# Patient Record
Sex: Female | Born: 1950
Health system: Southern US, Community
[De-identification: ages and names within clinical notes are randomized; demographics above are authoritative.]

## PROBLEM LIST (undated history)

## (undated) DIAGNOSIS — K219 Gastro-esophageal reflux disease without esophagitis: Secondary | ICD-10-CM

## (undated) DIAGNOSIS — F329 Major depressive disorder, single episode, unspecified: Secondary | ICD-10-CM

## (undated) DIAGNOSIS — R079 Chest pain, unspecified: Secondary | ICD-10-CM

## (undated) DIAGNOSIS — M4850XA Collapsed vertebra, not elsewhere classified, site unspecified, initial encounter for fracture: Secondary | ICD-10-CM

## (undated) DIAGNOSIS — R252 Cramp and spasm: Secondary | ICD-10-CM

## (undated) DIAGNOSIS — D759 Disease of blood and blood-forming organs, unspecified: Secondary | ICD-10-CM

## (undated) DIAGNOSIS — I499 Cardiac arrhythmia, unspecified: Secondary | ICD-10-CM

## (undated) DIAGNOSIS — Z87442 Personal history of urinary calculi: Secondary | ICD-10-CM

## (undated) DIAGNOSIS — M549 Dorsalgia, unspecified: Principal | ICD-10-CM

## (undated) DIAGNOSIS — F32A Depression, unspecified: Secondary | ICD-10-CM

## (undated) DIAGNOSIS — R000 Tachycardia, unspecified: Secondary | ICD-10-CM

## (undated) DIAGNOSIS — I10 Essential (primary) hypertension: Secondary | ICD-10-CM

## (undated) DIAGNOSIS — D693 Immune thrombocytopenic purpura: Principal | ICD-10-CM

## (undated) DIAGNOSIS — D72829 Elevated white blood cell count, unspecified: Secondary | ICD-10-CM

## (undated) DIAGNOSIS — M1990 Unspecified osteoarthritis, unspecified site: Secondary | ICD-10-CM

## (undated) DIAGNOSIS — N189 Chronic kidney disease, unspecified: Secondary | ICD-10-CM

## (undated) HISTORY — DX: Chest pain, unspecified: R07.9

## (undated) HISTORY — DX: Dorsalgia, unspecified: M54.9

## (undated) HISTORY — DX: Elevated white blood cell count, unspecified: D72.829

## (undated) HISTORY — PX: ABDOMINAL HYSTERECTOMY: SHX81

## (undated) HISTORY — DX: Cramp and spasm: R25.2

## (undated) HISTORY — DX: Collapsed vertebra, not elsewhere classified, site unspecified, initial encounter for fracture: M48.50XA

## (undated) HISTORY — DX: Essential (primary) hypertension: I10

## (undated) HISTORY — DX: Immune thrombocytopenic purpura: D69.3

---

## 1995-09-16 HISTORY — PX: TOTAL ABDOMINAL HYSTERECTOMY W/ BILATERAL SALPINGOOPHORECTOMY: SHX83

## 2007-09-16 HISTORY — PX: LAPAROTOMY: SHX154

## 2007-10-20 ENCOUNTER — Encounter: Admission: RE | Admit: 2007-10-20 | Discharge: 2007-10-20 | Payer: Self-pay | Admitting: Family Medicine

## 2007-10-27 ENCOUNTER — Ambulatory Visit: Admission: RE | Admit: 2007-10-27 | Discharge: 2007-10-27 | Payer: Self-pay | Admitting: Gynecologic Oncology

## 2007-10-27 ENCOUNTER — Ambulatory Visit (HOSPITAL_COMMUNITY): Admission: RE | Admit: 2007-10-27 | Discharge: 2007-10-27 | Payer: Self-pay | Admitting: Gynecologic Oncology

## 2007-11-02 ENCOUNTER — Encounter: Payer: Self-pay | Admitting: Gynecologic Oncology

## 2007-11-02 ENCOUNTER — Encounter: Payer: Self-pay | Admitting: Obstetrics & Gynecology

## 2007-11-02 ENCOUNTER — Inpatient Hospital Stay (HOSPITAL_COMMUNITY): Admission: RE | Admit: 2007-11-02 | Discharge: 2007-11-06 | Payer: Self-pay | Admitting: Obstetrics & Gynecology

## 2007-12-14 ENCOUNTER — Ambulatory Visit: Admission: RE | Admit: 2007-12-14 | Discharge: 2007-12-14 | Payer: Self-pay | Admitting: Gynecologic Oncology

## 2010-10-06 ENCOUNTER — Encounter: Payer: Self-pay | Admitting: Family Medicine

## 2011-01-28 NOTE — Consult Note (Signed)
[name], [name]              ACCOUNT NO.:  [id number]   MEDICAL RECORD NO.:  [id number]         PATIENT TYPE:  LINP   LOCATION:                               FACILITY:  [facility]   PHYSICIAN:  Paola A. Duard Brady, MD    DATE OF BIRTH:  [birth year]   DATE OF CONSULTATION:  DATE OF DISCHARGE:                                 CONSULTATION   REFERRING PHYSICIAN:  Dr. Domingo Pulse.   HISTORY:  The patient is seen today in consultation request of Dr.  Marita Kansas.  [name] is a 60 year old gravida 2, para 2 who has very  little access to health care.  Over the past several years, she is not  exactly sure, how many, has slowly been feeling her abdominal wall  increase in size.  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  She pulled her back muscle and went to see  Dr. Domingo Pulse as a new patient.  At that time Dr. Marita Kansas identified  a large abdominal pelvic mass, and she had a CT scan of the abdomen and  pelvis scheduled and performed on October 20, 2007.   Findings included a probable benign pulmonary nodule in the lingula as  well as one in the left lower lobe posteriorly.  In the right hepatic  lobe she had a multilobular 7.8 x 6.2 x 7.8 cm mass that was exophytic  from the liver, has component of higher densities.  It was felt that it  could be consistent with a hepatic adenoma; however, hepatocellular  carcinoma could not be excluded. Filling the majority of the abdomen and  pelvis, there was a 32 x 23 x 33 cm cystic mass with some degree of wall  thickening along the posterior margin, but with no internal nodularity  or discrete septations.  It displaced the surrounding bowel.  There was  no comment regarding any lymphadenopathy.  There was no evidence of any  ascitic fluid.  Within the pelvis, the uterus was absent, and there was  a massive intraperitoneal lesion.  They could not  identify the ovaries.  There was no pelvic free fluid.  For this reason, she is referred to [place].   She did have labs drawn, but no tumor markers were scheduled.  The  patient states that really she is uncomfortable, but has no significant  pain from this.  She denies any change in bowel or bladder habits, any  bleeding.  She occasionally has some nausea with brushing her teeth, but  that has been a longstanding issue.  She states that this has slowly  been growing over time, and she really cannot delineate how long this  has been going on for.   PAST MEDICAL HISTORY:  Hypertension.   MEDICATIONS:  None.   ALLERGIES:  None.   PAST SURGICAL HISTORY:  She had a hysterectomy in February of 1997 for  fibroids.  She has had two C-sections.  She has children ages 96 and 29.   SOCIAL HISTORY:  She smokes a pack day.  She has done this for 10 years.  She denies the use of alcohol.  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   FAMILY HISTORY:  Her mother had hypertension.  She died of a myocardial  infarction.  Her father has hypertension.  She had a paternal uncle who  died of leukemia.   HEALTH MAINTENANCE:  It has been a long time since she has had a  mammogram.  She has had no access to healthcare.  She never had a  colonoscopy.   PHYSICAL EXAMINATION:  VITAL SIGNS:  Weight 163 pounds, height 5 feet,  blood pressure 160/104, pulse 76.  GENERAL:  A thin female with a massively distended abdomen in no acute  distress.  NECK:  Supple.  There is no lymphadenopathy, no thyromegaly.  LUNGS:  Clear to auscultation bilaterally.  CARDIOVASCULAR EXAM:  Regular rate and rhythm.  ABDOMEN:  Massively protuberant.  It appears greater than a [redacted] weeks  gestation with a mass to the xiphoid from the level of the pubic  symphysis.  She does have a 2-cm umbilical hernia.  Abdomen is soft.  She has some sensitivity around the umbilicus.  The hernia is reducible.  The  mass completely filled the abdomen.  Groins are negative for  adenopathy.  EXTREMITIES:  There is no edema.  PELVIC:  External genitalia is within normal limits.  Bimanual  examination reveals a mass that fills the vagina and on rectovaginal  examination is smooth.  It is mobile.   ASSESSMENT:  A 60 year old with a 30 cm abdominal pelvic mass that has  been growing over years.  Based on her history and the benign  appearance, I think, this is most likely benign.  However, more  concerning is a separate 8 cm cystic lesion on the liver.   PLAN:  1. She is scheduled for an MRI of the liver, today, to better      delineate this liver mass.  Provided that this lesion appears to be      benign, she is tentatively scheduled for surgery consisting of      exploratory laparotomy and BSO this next coming Tuesday.  The risks      and benefits of surgery; including bleeding, infection, injury to      surrounding organs were discussed with the patient.  Should this be      a malignant ovarian mass, appropriate staging will ensue.  This was      also discussed with the patient.  Should the MRI of the liver      appear to be a malignant condition, we will need to obtain surgical      oncology consultation for consideration of a joint procedure.  If      not just a biopsy of the liver mass, at the time of surgery; if      this does not appear to be a benign adenoma on MRI.  We will obtain      tumor markers including inhibin CA-125 and CEA.  2. She was given a prescription for labetalol 100 mg twice daily for      her high blood pressure preoperatively.  3. She was given and for at of an 1 mg q.8 h. p.r.n. prior to her MRI.      Paola A. Duard Brady, MD  Electronically Signed     PAG/MEDQ  D:  10/27/2007  T:  10/28/2007  Job:  [id number]   cc:   Telford Nab, R.N.  [address]  [place], [place] [address]

## 2011-01-28 NOTE — Consult Note (Signed)
NAMEQUINTANA, CANELO              ACCOUNT NO.:  [id number]   MEDICAL RECORD NO.:  [id number]          PATIENT TYPE:  OUT   LOCATION:  GYN                          FACILITY:  [facility]   PHYSICIAN:  Paola A. Duard Brady, MD    DATE OF BIRTH:  15-May-1951   DATE OF CONSULTATION:  12/14/2007  DATE OF DISCHARGE:                                 CONSULTATION   The patient is a 60 year old G2, P2, with a large 32 x 23 x 33-cm cystic  mass, who was initially seen by [place] October 27, 2007.  After discussion  and evaluation she went to the operating room on November 02, 2007.  Operative findings included a large, simple-appearing right ovarian cyst  with 13 L of clear fluid within it.  There was some adhesive disease of  the rectosigmoid colon to the left adnexa and the omentum and the  anterior abdominal wall and bladder.  Final pathology was consistent  with washings being negative.  The right ovary had a benign serous  cystadenoma measuring 24 x 23 x 4.5 cm collapsed.  The omentum revealed  mature adipose tissue with no evidence of neoplasm.  The left ovary and  tube were benign.  She comes in today for a postoperative check.  She is  overall doing quite well.  She has quit smoking, which is caused her to  gain some of the weight back.  She started smoking a few cigarettes and  has been using Nicorette gum with success and has not started smoking  again.  Her pain is well-controlled.  She is scheduled to go back to  work on April 6.  She is very pleased with her pathology report.  She  denies any chest pain, shortness of breath, nausea, vomiting, fevers,  chills.  She has good pain control.  She denies any change in bowel or  bladder habits.   PHYSICAL EXAMINATION:  Weight 149 pounds, blood pressure 154/86.  Well-nourished, well-developed female in no acute distress.  ABDOMEN:  A well-healed vertical skin incision.  Abdomen is soft,  nontender, nondistended.  No palpable mass or hepatosplenomegaly.  Groins are negative for adenopathy.  EXTREMITIES:  No edema.   ASSESSMENT:  106. A 61 year old status post exploratory laparotomy, bilateral      salpingo-oophorectomy and lysis of adhesions for a benign ovarian      mass.  She will be released from our service as she had no evidence      of a malignant process.  She continues to have hypertension.  She      has not spoken to Dr. Marita Kansas regarding this but agrees to do so.      We refilled her prescription for her labetalol 100 mg p.o. b.i.d.  2. She is having some hot flashes, which she is managing with      Estroven.  She will continue doing so.  3. She can return to work on April 6.  She will be released from our      service but she knows we will be happy to see her in the future  should the need arise.      Paola A. Duard Brady, MD  Electronically Signed     PAG/MEDQ  D:  12/14/2007  T:  12/15/2007  Job:  [id number]   cc:   Domingo Pulse, MD   Telford Nab, R.N.  [address]  [place], [place] [address]

## 2011-01-28 NOTE — Op Note (Signed)
[name], [name]              ACCOUNT NO.:  [id number]   MEDICAL RECORD NO.:  [id number]          PATIENT TYPE:  INP   LOCATION:  1522                         FACILITY:  [facility]   PHYSICIAN:  Paola A. Duard Brady, MD    DATE OF BIRTH:  Jul 10, 1951   DATE OF PROCEDURE:  11/02/2007  DATE OF DISCHARGE:                               OPERATIVE REPORT   PREOPERATIVE DIAGNOSIS:  Large adnexal mass.   POSTOPERATIVE DIAGNOSIS:  Probable benign serous cystadenoma.   PROCEDURE:  Exploratory laparotomy, bilateral salpingo-oophorectomy,  lysis adhesions, partial omentectomy.   SURGEON:  Paola A. Duard Brady, M.D.  Roseanna Rainbow, M.D.   ASSISTANT:  Telford Nab, R.N.   ANESTHESIA:  General.   ANESTHESIOLOGIST:  Jenelle Mages. Fortune, M.D.   ESTIMATED BLOOD LOSS:  Less than 50 mL.   URINE OUTPUT:  200 mL.   FLUIDS REPLACED:  1800 mL.   OPERATIVE FINDINGS:  Included a large simple appearing right ovarian  cyst with approximately 13 liters of clear fluid within it.  There was  some adhesive disease of the rectosigmoid colon to the left adnexa, the  omentum, to the anterior abdominal wall, and bladder.   COMPLICATIONS:  None.   DISPOSITION OF SPECIMENS:  To pathology.   DESCRIPTION OF PROCEDURE:  The patient was taken to the operating room  after informed consent was signed and on the chart.  A time out was  performed to confirm the patient, procedure, positioning, and surgeons.  The patient was then placed in the dorsal lithotomy position prior to  the induction of anesthesia due to the low back pain concerns and  issues.  General anesthesia was then induced.  The abdomen was prepped  in the usual sterile fashion.  The perineum was prepped in the usual  fashion.  A Foley catheter was inserted into the bladder and a sponge  stick was placed in the vagina.  A vertical infraumbilical midline  incision was made in the area of her prior scar with the knife and  carried down to the  underlying fascia.  The fascia was identified,  scored in the midline, and the fascial incision was extended superiorly  and inferiorly using Bovie cautery.  The peritoneal cavity was entered.  The omentum was adherent to the anterior abdominal wall.  The peritoneum  was then opened inferiorly and superiorly.  At this time, a large mass  that was completely smooth and not adhesed was palpated.  We needed to  extend the incision approximately 5 more cm above the umbilicus.  There  is a 2 cm umbilical hernia.  Once the fascia and the peritoneum were  opened, the fascia on the patient's left side was grasped with Kocher  clamps and the underlying omentum was taken off its filmy adhesions  using Bovie cautery.  We then extended this superiorly and inferiorly to  completely free the omentum from the abdominal wall.  Dry laps were then  placed around the mass.  A small 1 cm incision was made in the mass and  was drained of 13 liters of fluid, 400 mL was sent  for cytology, the  rest was discarded.  The Bookwalter self-retaining retractor was then  placed on the abdomen with all appropriate precautions.  The small bowel  was tucked with moist lap pads.  A partial omentectomy was then  performed removing small pedicles of the omentum.  They were clamped,  transected, and ligated with 0 Vicryl.  Once the small bowel was  completely packed out of the way, our attention was first drawn to the  right pelvic sidewall.  The right side wall was opened.  The IP was  identified and the ureter was identified.  A window was made between the  IP and the ureter, it was clamped, transected and suture ligated with 0  Vicryl.  The ureter wanted to be brought up into the mass, therefore,  great care was taken to perform the ureterolysis to free the ureter from  the mass.  The mass was adhered to the posterior cul-de-sac with filmy  adhesions and to the vaginal cuff.  These adhesions were taken down  carefully and  meticulously.  The right ovary was then delivered and sent  to pathology.  Frozen section revealed a benign appearing serous  cystadenoma with no evidence of low malignant potential features or  carcinoma.  Our attention was then drawn to the patient's left side.  The adhesive disease of the rectosigmoid colon to the left adnexa was  taken down using sharp dissection.  The retroperitoneal space was  entered.  The ureter was identified.  The IP was clamped x2, transected  and suture ligated.  The remainder of the ovary was then freed of its  adhesive disease to the ovarian fossa and delivered without difficulty.  The abdomen and pelvis were copiously irrigated.  There was pinpoint  small bleeding along the peritoneal surfaces. This was controlled with  monopolar cautery.  All pedicles were inspected and noted to be  hemostatic.  The small bowel packing was then removed.  The fascia was  closed using a #1 PDS in a running fashion incorporating the umbilical  hernia site.  The subcu tissues were irrigated and point cautery was  used for hemostasis.  The skin was closed using skin clips.  The patient  tolerated the procedure well and was taken to the recovery room in  stable condition.  All instrument, needle and laparotomy counts were  correct x2.      Paola A. Duard Brady, MD  Electronically Signed     PAG/MEDQ  D:  11/02/2007  T:  11/02/2007  Job:  [id number]   cc:   Wende Neighbors, P.A.

## 2011-01-31 NOTE — Discharge Summary (Signed)
[name], [name]              ACCOUNT NO.:  [id number]   MEDICAL RECORD NO.:  [id number]          PATIENT TYPE:  INP   LOCATION:  1522                         FACILITY:  [facility]   PHYSICIAN:  Roseanna Rainbow, M.D.DATE OF BIRTH:  Mar 31, 1951   DATE OF ADMISSION:  11/02/2007  DATE OF DISCHARGE:  11/06/2007                               DISCHARGE SUMMARY   CHIEF COMPLAINT:  The patient is a 60 year old with a large abdominal  pelvic mass who presents for operative intervention.  Please see the  dictated history and physical as per Dr. Cleda Mccreedy.   HOSPITAL COURSE:  The patient was admitted.  She was underwent an  exploratory laparotomy with bilateral salpingo-oophorectomy and partial  omentectomy.  Please see the dictated operative summary.  On  postoperative day 1, her hemoglobin was 9.8 and her platelet count was  89,000, however, the manual count revealed large platelets.  She had  urinary retention that responded to replacing the Foley catheter and  bladder rest.  The remainder of her postoperative course was uneventful.  She was voiding without difficulty.  She was discharged to home  tolerating a regular diet.   DISCHARGE DIAGNOSIS:  Large right ovarian cyst.   PROCEDURE:  Exploratory laparotomy, bilateral salpingo-oophorectomy,  partial omentectomy.   CONDITION:  Stable.   DIET:  Regular.   ACTIVITY:  Pelvic rest and increase activity gradually.   DISPOSITION:  The patient was to follow up in the GYN oncology office on  November 10, 2007 at 10 a.m. for staple removal.      Roseanna Rainbow, M.D.  Electronically Signed     LAJ/MEDQ  D:  11/16/2007  T:  11/16/2007  Job:  [id number]   cc:   Telford Nab, R.N.  [address]  [place], [place] [address]

## 2011-06-06 LAB — CBC
HCT: 39.9
HCT: 27.5 — ABNORMAL LOW
Hemoglobin: 13.9
Hemoglobin: 9.8 — ABNORMAL LOW
MCHC: 34.7
MCHC: 35.8
MCV: 86
MCV: 85.3
Platelets: 128 — ABNORMAL LOW
Platelets: 89 — ABNORMAL LOW
RBC: 4.64
RBC: 3.23 — ABNORMAL LOW
RDW: 13.3
RDW: 13
WBC: 13.2 — ABNORMAL HIGH
WBC: 8.3

## 2011-06-06 LAB — COMPREHENSIVE METABOLIC PANEL
ALT: 19
AST: 14
Albumin: 4.2
Alkaline Phosphatase: 67
BUN: 10
CO2: 29
Calcium: 9.5
Chloride: 106
Creatinine, Ser: 0.77
GFR calc Af Amer: >60
GFR calc non Af Amer: >60
Glucose, Bld: 114 — ABNORMAL HIGH
Potassium: 4.8
Sodium: 140
Total Bilirubin: 0.9
Total Protein: 6.4

## 2011-06-06 LAB — URINALYSIS, ROUTINE W REFLEX MICROSCOPIC
Bilirubin Urine: NEGATIVE
Glucose, UA: NEGATIVE
Ketones, ur: NEGATIVE
Nitrite: NEGATIVE
Protein, ur: NEGATIVE
Specific Gravity, Urine: 1.015
Urobilinogen, UA: 0.2
pH: 5

## 2011-06-06 LAB — URINE CULTURE
Colony Count: NO GROWTH
Culture: NO GROWTH
Special Requests: NEGATIVE

## 2011-06-06 LAB — MISCELLANEOUS TEST: Miscellaneous Test Results: <10

## 2011-06-06 LAB — TECHNOLOGIST SMEAR REVIEW

## 2011-06-06 LAB — TYPE AND SCREEN
ABO/RH(D): A POS
Antibody Screen: NEGATIVE

## 2011-06-06 LAB — URINE MICROSCOPIC-ADD ON

## 2011-06-06 LAB — DIFFERENTIAL
Basophils Absolute: 0.1
Basophils Relative: 1
Eosinophils Absolute: 0.2
Eosinophils Relative: 2
Lymphocytes Relative: 12
Lymphs Abs: 1.5
Monocytes Absolute: 0.4
Monocytes Relative: 3
Neutro Abs: 10.9 — ABNORMAL HIGH
Neutrophils Relative %: 83 — ABNORMAL HIGH

## 2011-06-06 LAB — INHIBIN A: Inhibin-A: 4.2

## 2011-06-06 LAB — ABO/RH: ABO/RH(D): A POS

## 2011-06-06 LAB — CA 125: CA 125: 8

## 2011-06-06 LAB — CEA: CEA: 0.5

## 2012-09-28 ENCOUNTER — Encounter: Payer: Self-pay | Admitting: *Deleted

## 2012-09-28 ENCOUNTER — Encounter: Payer: Self-pay | Admitting: Cardiovascular Disease

## 2012-09-28 DIAGNOSIS — I10 Essential (primary) hypertension: Secondary | ICD-10-CM | POA: Insufficient documentation

## 2012-09-28 DIAGNOSIS — R079 Chest pain, unspecified: Secondary | ICD-10-CM | POA: Insufficient documentation

## 2012-09-29 ENCOUNTER — Institutional Professional Consult (permissible substitution): Payer: Self-pay | Admitting: Cardiovascular Disease

## 2012-12-30 ENCOUNTER — Encounter: Payer: Self-pay | Admitting: Cardiovascular Disease

## 2013-06-13 ENCOUNTER — Other Ambulatory Visit: Payer: Self-pay | Admitting: Orthopaedic Surgery

## 2013-06-13 DIAGNOSIS — M549 Dorsalgia, unspecified: Secondary | ICD-10-CM

## 2013-06-14 ENCOUNTER — Ambulatory Visit
Admission: RE | Admit: 2013-06-14 | Discharge: 2013-06-14 | Disposition: A | Discharge status: Home / Self Care | Payer: BC Managed Care – PPO | Source: Ambulatory Visit | Attending: Orthopaedic Surgery | Admitting: Orthopaedic Surgery

## 2013-06-14 DIAGNOSIS — M549 Dorsalgia, unspecified: Secondary | ICD-10-CM

## 2013-06-29 ENCOUNTER — Other Ambulatory Visit: Payer: Self-pay | Admitting: Neurosurgery

## 2013-07-04 ENCOUNTER — Other Ambulatory Visit: Payer: BC Managed Care – PPO

## 2013-07-04 ENCOUNTER — Ambulatory Visit
Admission: RE | Admit: 2013-07-04 | Discharge: 2013-07-04 | Disposition: A | Discharge status: Home / Self Care | Payer: BC Managed Care – PPO | Source: Ambulatory Visit | Attending: Neurosurgery | Admitting: Neurosurgery

## 2013-07-04 ENCOUNTER — Other Ambulatory Visit: Payer: Self-pay | Admitting: Neurosurgery

## 2013-07-04 DIAGNOSIS — S32001A Stable burst fracture of unspecified lumbar vertebra, initial encounter for closed fracture: Secondary | ICD-10-CM

## 2013-07-05 ENCOUNTER — Ambulatory Visit
Admission: RE | Admit: 2013-07-05 | Discharge: 2013-07-05 | Disposition: A | Discharge status: Home / Self Care | Payer: BC Managed Care – PPO | Source: Ambulatory Visit | Attending: Neurosurgery | Admitting: Neurosurgery

## 2013-07-14 ENCOUNTER — Other Ambulatory Visit: Payer: Self-pay | Admitting: Neurological Surgery

## 2013-08-05 ENCOUNTER — Encounter (HOSPITAL_COMMUNITY): Payer: Self-pay | Admitting: Vascular Surgery

## 2013-08-05 ENCOUNTER — Other Ambulatory Visit (HOSPITAL_COMMUNITY): Payer: Self-pay | Admitting: *Deleted

## 2013-08-05 ENCOUNTER — Encounter (HOSPITAL_COMMUNITY)
Admission: RE | Admit: 2013-08-05 | Discharge: 2013-08-05 | Disposition: A | Discharge status: Home / Self Care | Payer: BC Managed Care – PPO | Source: Ambulatory Visit | Attending: Anesthesiology | Admitting: Anesthesiology

## 2013-08-05 ENCOUNTER — Encounter (HOSPITAL_COMMUNITY): Payer: Self-pay

## 2013-08-05 ENCOUNTER — Encounter (HOSPITAL_COMMUNITY)
Admission: RE | Admit: 2013-08-05 | Discharge: 2013-08-05 | Disposition: A | Discharge status: Home / Self Care | Payer: BC Managed Care – PPO | Source: Ambulatory Visit | Attending: Neurological Surgery | Admitting: Neurological Surgery

## 2013-08-05 DIAGNOSIS — Z01812 Encounter for preprocedural laboratory examination: Secondary | ICD-10-CM | POA: Insufficient documentation

## 2013-08-05 DIAGNOSIS — Z01818 Encounter for other preprocedural examination: Secondary | ICD-10-CM | POA: Insufficient documentation

## 2013-08-05 DIAGNOSIS — Z01810 Encounter for preprocedural cardiovascular examination: Secondary | ICD-10-CM | POA: Insufficient documentation

## 2013-08-05 HISTORY — DX: Gastro-esophageal reflux disease without esophagitis: K21.9

## 2013-08-05 HISTORY — DX: Personal history of urinary calculi: Z87.442

## 2013-08-05 HISTORY — DX: Cardiac arrhythmia, unspecified: I49.9

## 2013-08-05 HISTORY — DX: Tachycardia, unspecified: R00.0

## 2013-08-05 LAB — HEPATIC FUNCTION PANEL
ALT: 39 U/L — ABNORMAL HIGH (ref 0–35)
AST: 20 U/L (ref 0–37)
Albumin: 4.2 g/dL (ref 3.5–5.2)
Alkaline Phosphatase: 72 U/L (ref 39–117)
Bilirubin, Direct: 0.1 mg/dL (ref 0.0–0.3)
Indirect Bilirubin: 0.4 mg/dL (ref 0.3–0.9)
Total Bilirubin: 0.5 mg/dL (ref 0.3–1.2)
Total Protein: 6.7 g/dL (ref 6.0–8.3)

## 2013-08-05 LAB — CBC
HCT: 43.2 % (ref 36.0–46.0)
Hemoglobin: 15.3 g/dL — ABNORMAL HIGH (ref 12.0–15.0)
MCH: 29.3 pg (ref 26.0–34.0)
MCHC: 35.4 g/dL (ref 30.0–36.0)
MCV: 82.8 fL (ref 78.0–100.0)
Platelets: 60 10*3/uL — ABNORMAL LOW (ref 150–400)
RBC: 5.22 MIL/uL — ABNORMAL HIGH (ref 3.87–5.11)
RDW: 14.7 % (ref 11.5–15.5)
WBC: 13.6 10*3/uL — ABNORMAL HIGH (ref 4.0–10.5)

## 2013-08-05 LAB — BASIC METABOLIC PANEL
BUN: 12 mg/dL (ref 6–23)
CO2: 26 mEq/L (ref 19–32)
Calcium: 9.3 mg/dL (ref 8.4–10.5)
Chloride: 103 mEq/L (ref 96–112)
Creatinine, Ser: 0.65 mg/dL (ref 0.50–1.10)
GFR calc Af Amer: >90 mL/min (ref >90)
GFR calc non Af Amer: >90 mL/min (ref >90)
Glucose, Bld: 104 mg/dL — ABNORMAL HIGH (ref 70–99)
Potassium: 3.9 mEq/L (ref 3.5–5.1)
Sodium: 139 mEq/L (ref 135–145)

## 2013-08-05 LAB — TYPE AND SCREEN
ABO/RH(D): A POS
Antibody Screen: NEGATIVE

## 2013-08-05 LAB — ABO/RH: ABO/RH(D): A POS

## 2013-08-05 LAB — SURGICAL PCR SCREEN
MRSA, PCR: NEGATIVE
Staphylococcus aureus: NEGATIVE

## 2013-08-05 NOTE — Pre-Procedure Instructions (Addendum)
[name]  08/05/2013   Your procedure is scheduled on:  08/16/13  Report to [facility] short stay admitting at 530 AM.  Call this number if you have problems the morning of surgery: [phone]   Remember:   Do not eat food or drink liquids after midnight.   Take these medicines the morning of surgery with A SIP OF WATER: pain med if needed                 STOP all herbel meds, nsaids (aleve,naproxen,advil,ibuprofen) 5 days prior to surgery   Do not wear jewelry, make-up or nail polish.  Do not wear lotions, powders, or perfumes. You may wear deodorant.  Do not shave 48 hours prior to surgery. Men may shave face and neck.  Do not bring valuables to the hospital.  [facility] is not responsible                  for any belongings or valuables.               Contacts, dentures or bridgework may not be worn into surgery.  Leave suitcase in the car. After surgery it may be brought to your room.  For patients admitted to the hospital, discharge time is determined by your                treatment team.               Patients discharged the day of surgery will not be allowed to drive  home.  Name and phone number of your driver:   Special Instructions: Shower using CHG 2 nights before surgery and the night before surgery.  If you shower the day of surgery use CHG.  Use special wash - you have one bottle of CHG for all showers.  You should use approximately 1/3 of the bottle for each shower.   Please read over the following fact sheets that you were given: Pain Booklet, Coughing and Deep Breathing, Blood Transfusion Information, MRSA Information and Surgical Site Infection Prevention

## 2013-08-05 NOTE — Progress Notes (Addendum)
Anesthesia PAT Evaluation:  Patient is a 62 year old female scheduled for L1 corpectomy and fusion/lateral approach to by done by Dr. Danielle Dess with Dr. Blanche East to assist on 08/16/13. Case is posted for > 7 hours. She developed rather acute back pain on 05/19/13.  Eventual CT and MRI showed L1 compression fracture, and she was referred to Dr. Phoebe Perch.  TLSO brace was recommended until surgery could be performed. (Although Dr. Phoebe Perch is her primary Neurosurgeon, Dr. Danielle Dess is taking lead due to the type of surgical approach needed.)  History includes obesity, HTN (not currently treated), occasional tachycardiac/palpitations (not sustained),  GERD, nephrolithiasis, smoking 1/2 ppd, hysterectomy.  Chest pain is also on her history but she denies this.  She does not see a cardiologist.  She receives primary care thru [facility].  BP at PAT today was ~ 160-170/90-100.  She has been prescribed several different anti-hypertensives in the past, but eventually stopped them all because she felt they made her tachycardic.    EKG on 08/05/13 showed: NSR, possible LAE, LAFB.  No comparison EKGs are available.  She denies prior cardiac testing.  She denies chest pain, significant edema.  She does report SOB when lying down that has been present since her compression fracture diagnosis.  She feels her breathing is actually better once she's up and moving around.  She is not very active and her primary exercise is taking her dog outside for a walk around her house.  Prior to her fracture she could vacuum.  She does not really have many stairs to climb.   Exam shows a pleasant female in NAD.  Heart RRR, no murmur noted.  Lungs clear.  No pitting LE edema.  No carotid bruits noted.  CT of the L-spine on 1020/14 showed: Advanced compression fracture at L1, approaching vertebra plana. Kyphotic angulation at the fracture site of 30 degrees. Posterior bowing of the posterior superior margin of the vertebral body measuring  8 mm. This is slightly more pronounced on the right. Neural compression could occur in this location.  MRI of the L spine on 06/14/13 showed: 1. L1 burst fracture has developed since 2009 and is associated with severe loss of vertebral body height and osseous retropulsion. There is mild mass effect on the conus medullaris. This fracture has a subacute appearance with only mild marrow edema.  2. No other fractures identified.  3. Very mild spondylosis. No disc herniation, spinal stenosis or nerve root encroachment is present at the disc space levels.  CXR on 08/05/13 showed: Mild cardiac enlargement. No pleural effusion or edema identified. No airspace consolidation. Scarring noted within the right midlung. L1 vertebra plana deformity is identified. No acute cardiopulmonary abnormalities.  Preoperative labs noted.  CMET unremarkable, except ALT mildly elevated at 39, glucose 104. HFP was added due to finding of thrombocytopenia with a PLT count of 60K.  Her previous PLT counts in 2009 were 89-128K.  WBC 13.6.  H/H 15.3/43.2.  T&S done.  She denied ETOH use. I already called PLT count to Shanda Bumps at Dr. Verlee Rossetti office.    Above reviewed with anesthesiologist Dr. Gypsy Balsam.  BP elevated but at current trends should be be prohibitive for surgery.  In regards to thrombocytopenia, he felt decision for further work-up and treatment could be deferred to the surgeons.  I will follow-up with Shanda Bumps next week.  Velna Ochs [facility] Stay [facility]/Anesthesiology Phone [phone] 08/05/2013 5:45 PM  Addendum: 08/08/2013 10:00 AM Dr. Phoebe Perch is out of  town this week, but she did send a message to Dr. Danielle Dess to review labs and advise.    Addendum: 08/15/2013 2:50 PM After review of labs, Dr. Danielle Dess had recommended further evaluation for thrombocytopenia by patient's PCP.  Shanda Bumps called from his office today that surgery is being canceled for 08/16/13.

## 2013-08-05 NOTE — Progress Notes (Signed)
08/05/13 1431  OBSTRUCTIVE SLEEP APNEA  Have you ever been diagnosed with sleep apnea through a sleep study? No  Do you snore loudly (loud enough to be heard through closed doors)?  1  Do you often feel tired, fatigued, or sleepy during the daytime? 1  Has anyone observed you stop breathing during your sleep? 0  Do you have, or are you being treated for high blood pressure? 1  BMI more than 35 kg/m2? 0  Age over 60 years old? 1  Neck circumference greater than 40 cm/18 inches? 0 (15.5)  Gender: 0  Obstructive Sleep Apnea Score 4  Score 4 or greater  Results sent to PCP

## 2013-08-16 ENCOUNTER — Inpatient Hospital Stay (HOSPITAL_COMMUNITY)
Admission: RE | Admit: 2013-08-16 | Payer: BC Managed Care – PPO | Source: Ambulatory Visit | Admitting: Neurological Surgery

## 2013-08-16 ENCOUNTER — Encounter (HOSPITAL_COMMUNITY): Admission: RE | Payer: Self-pay | Source: Ambulatory Visit

## 2013-08-16 SURGERY — TRANSTHORACIC VERTEBRECTOMY
Anesthesia: General

## 2013-08-22 ENCOUNTER — Telehealth: Payer: Self-pay | Admitting: Hematology and Oncology

## 2013-08-22 NOTE — Telephone Encounter (Signed)
C/D 08/22/13 for appt. 08/24/13 °

## 2013-08-22 NOTE — Telephone Encounter (Signed)
C/D 08/22/13 for appt. 08/24/13

## 2013-08-22 NOTE — Telephone Encounter (Signed)
S/W PT AND GVE NP APPT 12/10 @ 1:30 W/DR. GORSUCH REFERRING KIMBERLY [name] DX- THROMBOCYTOPENIA WELCOME PACKET MAILED.

## 2013-08-24 ENCOUNTER — Encounter: Payer: Self-pay | Admitting: Hematology and Oncology

## 2013-08-24 ENCOUNTER — Ambulatory Visit (HOSPITAL_BASED_OUTPATIENT_CLINIC_OR_DEPARTMENT_OTHER): Payer: BC Managed Care – PPO

## 2013-08-24 ENCOUNTER — Ambulatory Visit (HOSPITAL_BASED_OUTPATIENT_CLINIC_OR_DEPARTMENT_OTHER): Payer: BC Managed Care – PPO | Admitting: Hematology and Oncology

## 2013-08-24 ENCOUNTER — Telehealth: Payer: Self-pay | Admitting: Hematology and Oncology

## 2013-08-24 VITALS — BP 173/76 | HR 74 | Temp 98.5°F | Resp 18 | Ht <= 58 in | Wt 166.0 lb

## 2013-08-24 DIAGNOSIS — D751 Secondary polycythemia: Secondary | ICD-10-CM

## 2013-08-24 DIAGNOSIS — D72829 Elevated white blood cell count, unspecified: Secondary | ICD-10-CM

## 2013-08-24 DIAGNOSIS — D696 Thrombocytopenia, unspecified: Secondary | ICD-10-CM

## 2013-08-24 DIAGNOSIS — M899 Disorder of bone, unspecified: Secondary | ICD-10-CM

## 2013-08-24 DIAGNOSIS — F17200 Nicotine dependence, unspecified, uncomplicated: Secondary | ICD-10-CM

## 2013-08-24 LAB — CBC WITH DIFFERENTIAL/PLATELET
BASO%: 0.1 % (ref 0.0–2.0)
Basophils Absolute: 0 10*3/uL (ref 0.0–0.1)
EOS%: 3.2 % (ref 0.0–7.0)
Eosinophils Absolute: 0.4 10*3/uL (ref 0.0–0.5)
HCT: 41.7 % (ref 34.8–46.6)
HGB: 14.5 g/dL (ref 11.6–15.9)
LYMPH%: 15.1 % (ref 14.0–49.7)
MCH: 28.8 pg (ref 25.1–34.0)
MCHC: 34.8 g/dL (ref 31.5–36.0)
MCV: 82.7 fL (ref 79.5–101.0)
MONO#: 0.4 10*3/uL (ref 0.1–0.9)
MONO%: 3.7 % (ref 0.0–14.0)
NEUT#: 8.5 10*3/uL — ABNORMAL HIGH (ref 1.5–6.5)
NEUT%: 77.9 % — ABNORMAL HIGH (ref 38.4–76.8)
Platelets: 61 10*3/uL — ABNORMAL LOW (ref 145–400)
RBC: 5.04 10*6/uL (ref 3.70–5.45)
RDW: 14.8 % — ABNORMAL HIGH (ref 11.2–14.5)
WBC: 10.9 10*3/uL — ABNORMAL HIGH (ref 3.9–10.3)
lymph#: 1.6 10*3/uL (ref 0.9–3.3)
nRBC: 0 % (ref 0–0)

## 2013-08-24 LAB — COMPREHENSIVE METABOLIC PANEL (CC13)
ALT: 44 U/L (ref 0–55)
AST: 20 U/L (ref 5–34)
Albumin: 4.4 g/dL (ref 3.5–5.0)
Alkaline Phosphatase: 77 U/L (ref 40–150)
Anion Gap: 10 mEq/L (ref 3–11)
BUN: 13.5 mg/dL (ref 7.0–26.0)
CO2: 23 mEq/L (ref 22–29)
Calcium: 9.6 mg/dL (ref 8.4–10.4)
Chloride: 107 mEq/L (ref 98–109)
Creatinine: 0.7 mg/dL (ref 0.6–1.1)
Glucose: 103 mg/dl (ref 70–140)
Potassium: 4.2 mEq/L (ref 3.5–5.1)
Sodium: 141 mEq/L (ref 136–145)
Total Bilirubin: 0.86 mg/dL (ref 0.20–1.20)
Total Protein: 6.9 g/dL (ref 6.4–8.3)

## 2013-08-24 LAB — CHCC SMEAR

## 2013-08-24 LAB — TECHNOLOGIST REVIEW

## 2013-08-24 NOTE — Progress Notes (Signed)
[facility] CONSULT NOTE  Patient Care Team: Marva Panda as PCP - General Artis Delay, MD as Consulting Physician (Hematology and Oncology)  CHIEF COMPLAINTS/PURPOSE OF CONSULTATION:  Progressive thrombocytopenia  HISTORY OF PRESENTING ILLNESS:  [name] 62 y.o. female is here because of abnormal CBC. I review her CBC from 2009 and she had progressive thrombocytopenia from 128, down to 89 and most recently was down to 60. The recent blood test was done this because she need surgery due to compression fracture of unknown etiology. The patient is also an active smoker. She was noted to have mild leukocytosis and erythrocytosis. She denies any recent infection. With erythrocytosis, she denies any headache, chest pain, or leg cramps. With her thrombocytopenia, the patient denies any recent signs or symptoms of bleeding such as spontaneous epistaxis, hematuria or hematochezia. She denies any prior diagnosis of liver disease. Denies any abnormal skin rash. MEDICAL HISTORY:  Past Medical History  Diagnosis Date  . Chest pain     denies  . Tachycardia     occ upon exersion, and when put on bp med  . HTN (hypertension)   . Dysrhythmia     occ tachycardia  . History of kidney stones   . GERD (gastroesophageal reflux disease)     occ  . Fracture of vertebra, compression     SURGICAL HISTORY: Past Surgical History  Procedure Laterality Date  . Laparotomy Right 09    ovarian cyst  . Total abdominal hysterectomy w/ bilateral salpingoophorectomy  97  . Cesarean section      x2    SOCIAL HISTORY: History   Social History  . Marital Status: Divorced    Spouse Name: N/A    Number of Children: N/A  . Years of Education: N/A   Occupational History  . Not on file.   Social History Main Topics  . Smoking status: Current Every Day Smoker -- 0.50 packs/day for 23 years    Types: Cigarettes  . Smokeless tobacco: Never Used  . Alcohol Use: No  . Drug  Use: No  . Sexual Activity: Not on file   Other Topics Concern  . Not on file   Social History Narrative  . No narrative on file    FAMILY HISTORY: Family History  Problem Relation Age of Onset  . Hypertension    . Heart attack    . Leukemia Paternal Uncle     leukemia    ALLERGIES:  has No Known Allergies.  MEDICATIONS:  Current Outpatient Prescriptions  Medication Sig Dispense Refill  . ibuprofen (ADVIL,MOTRIN) 200 MG tablet Take 400 mg by mouth every 6 (six) hours as needed for moderate pain.      . metoprolol succinate (TOPROL-XL) 50 MG 24 hr tablet       . oxyCODONE (OXY IR/ROXICODONE) 5 MG immediate release tablet Take 5-15 mg by mouth 2 (two) times daily as needed for severe pain.       . Vitamin D, Ergocalciferol, (DRISDOL) 50000 UNITS CAPS capsule Take 50,000 Units by mouth every 7 (seven) days.      Marland Kitchen acetaminophen (TYLENOL) 500 MG tablet Take 1,000 mg by mouth every 6 (six) hours as needed for moderate pain.      Marland Kitchen lisinopril-hydrochlorothiazide (PRINZIDE,ZESTORETIC) 20-25 MG per tablet Take 1 tablet by mouth daily.      . promethazine (PHENERGAN) 25 MG tablet Take 25 mg by mouth every 6 (six) hours as needed for nausea or vomiting.  No current facility-administered medications for this visit.    REVIEW OF SYSTEMS:   Constitutional: Denies fevers, chills or abnormal night sweats Eyes: Denies blurriness of vision, double vision or watery eyes Ears, nose, mouth, throat, and face: Denies mucositis or sore throat Respiratory: Denies cough, dyspnea or wheezes Cardiovascular: Denies palpitation, chest discomfort or lower extremity swelling Gastrointestinal:  Denies nausea, heartburn or change in bowel habits Skin: Denies abnormal skin rashes Lymphatics: Denies new lymphadenopathy or easy bruising Neurological:Denies numbness, tingling or new weaknesses Behavioral/Psych: Mood is stable, no new changes  All other systems were reviewed with the patient and are  negative.  PHYSICAL EXAMINATION: ECOG PERFORMANCE STATUS: 0 - Asymptomatic  Filed Vitals:   08/24/13 1329  BP: 173/76  Pulse: 74  Temp: 98.5 F (36.9 C)  Resp: 18   Filed Weights   08/24/13 1329  Weight: 166 lb (75.297 kg)    GENERAL:alert, no distress and comfortable. SKIN: skin color, texture, turgor are normal, no rashes or significant lesions EYES: normal, conjunctiva are pink and non-injected, sclera clear OROPHARYNX:no exudate, no erythema and lips, buccal mucosa, and tongue normal  NECK: supple, thyroid normal size, non-tender, without nodularity LYMPH:  no palpable lymphadenopathy in the cervical, axillary or inguinal LUNGS: clear to auscultation and percussion with normal breathing effort HEART: regular rate & rhythm and no murmurs and no lower extremity edema ABDOMEN:abdomen soft, non-tender and normal bowel sounds Musculoskeletal:no cyanosis of digits and no clubbing  PSYCH: alert & oriented x 3 with fluent speech NEURO: no focal motor/sensory deficits  LABORATORY DATA:  I have reviewed the data as listed Lab Results  Component Value Date   WBC 10.9* 08/24/2013   HGB 14.5 08/24/2013   HCT 41.7 08/24/2013   MCV 82.7 08/24/2013   PLT 61 Platelet count consistent in citrate* 08/24/2013   RADIOGRAPHIC STUDIES: I have personally reviewed the radiological images as listed and agreed with the findings in the report. Dg Chest 2 View  08/05/2013   CLINICAL DATA:  Preop radiograph for L1 corpectomy.  EXAM: CHEST  2 VIEW  COMPARISON:  05/22/2013.  FINDINGS: Mild cardiac enlargement. No pleural effusion or edema identified. No airspace consolidation. Scarring noted within the right midlung. L1 vertebra plana deformity is identified.  IMPRESSION: 1. No acute cardiopulmonary abnormalities. 2. L1 vertebra plana   Electronically Signed   By: Signa Kell M.D.   On: 08/05/2013 16:40    ASSESSMENT:  #1 leukocytosis #2 erythrocytosis  #3 thrombocytopenia #4  smoking  PLAN:  #1 leukocytosis This could be due to smoking. I will order additional workup for this #2 erythrocytosis Again this could be due to smoking. We'll repeat blood tests again #3 thrombocytopenia This is worse. I suspect she may have diagnosis of ITP I will order additional workup for this. Due to the need for surgery, if ITP suspected, we may have to treat her with a short course corticosteroids to bring her platelet count up for surgical clearance and hopefully avoid unnecessary platelet transfusion #4 smoking I spent some time educating the patient importance of nicotine cessation. She is attempting to quit #5 compression fracture Cause is unknown. I would defer to surgical service for further management. The patient is taking calcium and vitamin D supplements. Orders Placed This Encounter  Procedures  . CBC with Differential    Standing Status: Future     Number of Occurrences: 1     Standing Expiration Date: 05/16/2014  . Comprehensive metabolic panel    Standing Status: Future  Number of Occurrences: 1     Standing Expiration Date: 08/24/2014  . Platelet by Citrate    Standing Status: Future     Number of Occurrences: 1     Standing Expiration Date: 08/24/2014  . Platelet Antibody, Indirect IgG    Standing Status: Future     Number of Occurrences: 1     Standing Expiration Date: 08/24/2014  . Hepatitis B surface antibody    Standing Status: Future     Number of Occurrences: 1     Standing Expiration Date: 08/24/2014  . Hepatitis B surface antigen    Standing Status: Future     Number of Occurrences: 1     Standing Expiration Date: 08/24/2014  . Vitamin B12    Standing Status: Future     Number of Occurrences: 1     Standing Expiration Date: 08/24/2014  . Smear    Standing Status: Future     Number of Occurrences: 1     Standing Expiration Date: 08/24/2014  . HIV antibody    Standing Status: Future     Number of Occurrences: 1     Standing  Expiration Date: 08/24/2014  . Hepatitis C antibody    Standing Status: Future     Number of Occurrences: 1     Standing Expiration Date: 08/24/2014  . ANA    Standing Status: Future     Number of Occurrences: 1     Standing Expiration Date: 08/24/2014  . Rheumatoid factor    Standing Status: Future     Number of Occurrences: 1     Standing Expiration Date: 08/24/2014  . Helicobacter pylori abs-IgG+IgA, bld    Standing Status: Future     Number of Occurrences: 1     Standing Expiration Date: 08/24/2014  . Sedimentation rate    Standing Status: Future     Number of Occurrences: 1     Standing Expiration Date: 08/24/2014    All questions were answered. The patient knows to call the clinic with any problems, questions or concerns.    [facility], Kendarrius Tanzi, MD 08/24/2013 4:33 PM

## 2013-08-24 NOTE — Progress Notes (Signed)
Checked in new patient with no financial issues. She has appt card. °

## 2013-08-24 NOTE — Telephone Encounter (Signed)
gv adn printed appt sched an davs for pt for DEC...sent pt to lab

## 2013-08-29 ENCOUNTER — Telehealth: Payer: Self-pay | Admitting: Hematology and Oncology

## 2013-08-29 ENCOUNTER — Ambulatory Visit (HOSPITAL_BASED_OUTPATIENT_CLINIC_OR_DEPARTMENT_OTHER): Payer: BC Managed Care – PPO | Admitting: Hematology and Oncology

## 2013-08-29 ENCOUNTER — Encounter: Payer: Self-pay | Admitting: Hematology and Oncology

## 2013-08-29 VITALS — BP 136/80 | HR 73 | Temp 99.4°F | Resp 21 | Ht <= 58 in | Wt 163.2 lb

## 2013-08-29 DIAGNOSIS — D696 Thrombocytopenia, unspecified: Secondary | ICD-10-CM

## 2013-08-29 DIAGNOSIS — D693 Immune thrombocytopenic purpura: Secondary | ICD-10-CM

## 2013-08-29 DIAGNOSIS — D72829 Elevated white blood cell count, unspecified: Secondary | ICD-10-CM

## 2013-08-29 DIAGNOSIS — F17200 Nicotine dependence, unspecified, uncomplicated: Secondary | ICD-10-CM

## 2013-08-29 HISTORY — DX: Immune thrombocytopenic purpura: D69.3

## 2013-08-29 LAB — SEDIMENTATION RATE: Sed Rate: 1 mm/hr (ref 0–22)

## 2013-08-29 LAB — HEPATITIS B SURFACE ANTIBODY,QUALITATIVE: Hep B S Ab: NEGATIVE

## 2013-08-29 LAB — HELICOBACTER PYLORI ABS-IGG+IGA, BLD
H Pylori IgG: <0.4 {ISR}
HELICOBACTER PYLORI AB, IGA: 1.6 U/mL (ref <9.0)

## 2013-08-29 LAB — VITAMIN B12: Vitamin B-12: 228 pg/mL (ref 211–911)

## 2013-08-29 LAB — HIV ANTIBODY (ROUTINE TESTING W REFLEX): HIV: NONREACTIVE

## 2013-08-29 LAB — ANA: Anti Nuclear Antibody(ANA): NEGATIVE

## 2013-08-29 LAB — PLATELET ANTIBODY, INDIRECT IGG

## 2013-08-29 LAB — RHEUMATOID FACTOR: Rhuematoid fact SerPl-aCnc: <10 IU/mL (ref <=14)

## 2013-08-29 LAB — HEPATITIS B SURFACE ANTIGEN: Hepatitis B Surface Ag: NEGATIVE

## 2013-08-29 LAB — HEPATITIS C ANTIBODY: HCV Ab: NEGATIVE

## 2013-08-29 MED ORDER — PREDNISONE 20 MG PO TABS: 60.0000 mg | ORAL_TABLET | Freq: Every day | ORAL | Status: DC

## 2013-08-29 NOTE — Patient Instructions (Signed)
Prednisone tablets What is this medicine? PREDNISONE (PRED Emmeline Winebarger sone) is a corticosteroid. It is commonly used to treat inflammation of the skin, joints, lungs, and other organs. Common conditions treated include asthma, allergies, and arthritis. It is also used for other conditions, such as blood disorders and diseases of the adrenal glands. This medicine may be used for other purposes; ask your health care provider or pharmacist if you have questions. COMMON BRAND NAME(S): Deltasone, Predone, Sterapred DS, Sterapred What should I tell my health care provider before I take this medicine? They need to know if you have any of these conditions: -Cushing's syndrome -diabetes -glaucoma -heart disease -high blood pressure -infection (especially a virus infection such as chickenpox, cold sores, or herpes) -kidney disease -liver disease -mental illness -myasthenia gravis -osteoporosis -seizures -stomach or intestine problems -thyroid disease -an unusual or allergic reaction to lactose, prednisone, other medicines, foods, dyes, or preservatives -pregnant or trying to get pregnant -breast-feeding How should I use this medicine? Take this medicine by mouth with a glass of water. Follow the directions on the prescription label. Take this medicine with food. If you are taking this medicine once a day, take it in the morning. Do not take more medicine than you are told to take. Do not suddenly stop taking your medicine because you may develop a severe reaction. Your doctor will tell you how much medicine to take. If your doctor wants you to stop the medicine, the dose may be slowly lowered over time to avoid any side effects. Talk to your pediatrician regarding the use of this medicine in children. Special care may be needed. Overdosage: If you think you have taken too much of this medicine contact a poison control center or emergency room at once. NOTE: This medicine is only for you. Do not share this  medicine with others. What if I miss a dose? If you miss a dose, take it as soon as you can. If it is almost time for your next dose, talk to your doctor or health care professional. You may need to miss a dose or take an extra dose. Do not take double or extra doses without advice. What may interact with this medicine? Do not take this medicine with any of the following medications: -metyrapone -mifepristone This medicine may also interact with the following medications: -aminoglutethimide -amphotericin B -aspirin and aspirin-like medicines -barbiturates -certain medicines for diabetes, like glipizide or glyburide -cholestyramine -cholinesterase inhibitors -cyclosporine -digoxin -diuretics -ephedrine -female hormones, like estrogens and birth control pills -isoniazid -ketoconazole -NSAIDS, medicines for pain and inflammation, like ibuprofen or naproxen -phenytoin -rifampin -toxoids -vaccines -warfarin This list may not describe all possible interactions. Give your health care provider a list of all the medicines, herbs, non-prescription drugs, or dietary supplements you use. Also tell them if you smoke, drink alcohol, or use illegal drugs. Some items may interact with your medicine. What should I watch for while using this medicine? Visit your doctor or health care professional for regular checks on your progress. If you are taking this medicine over a prolonged period, carry an identification card with your name and address, the type and dose of your medicine, and your doctor's name and address. This medicine may increase your risk of getting an infection. Tell your doctor or health care professional if you are around anyone with measles or chickenpox, or if you develop sores or blisters that do not heal properly. If you are going to have surgery, tell your doctor or health care professional that   you have taken this medicine within the last twelve months. Ask your doctor or health  care professional about your diet. You may need to lower the amount of salt you eat. This medicine may affect blood sugar levels. If you have diabetes, check with your doctor or health care professional before you change your diet or the dose of your diabetic medicine. What side effects may I notice from receiving this medicine? Side effects that you should report to your doctor or health care professional as soon as possible: -allergic reactions like skin rash, itching or hives, swelling of the face, lips, or tongue -changes in emotions or moods -changes in vision -depressed mood -eye pain -fever or chills, cough, sore throat, pain or difficulty passing urine -increased thirst -swelling of ankles, feet Side effects that usually do not require medical attention (report to your doctor or health care professional if they continue or are bothersome): -confusion, excitement, restlessness -headache -nausea, vomiting -skin problems, acne, thin and shiny skin -trouble sleeping -weight gain This list may not describe all possible side effects. Call your doctor for medical advice about side effects. You may report side effects to FDA at 1-800-FDA-1088. Where should I keep my medicine? Keep out of the reach of children. Store at room temperature between 15 and 30 degrees C (59 and 86 degrees F). Protect from light. Keep container tightly closed. Throw away any unused medicine after the expiration date. NOTE: This sheet is a summary. It may not cover all possible information. If you have questions about this medicine, talk to your doctor, pharmacist, or health care provider.  2014, Elsevier/Gold Standard. (2011-04-17 10:57:14)  

## 2013-08-29 NOTE — Telephone Encounter (Signed)
appts made per 09/16/13 POF AVS and CAL given shh °

## 2013-08-29 NOTE — Progress Notes (Signed)
[facility] OFFICE PROGRESS NOTE  [name], [name], NP DIAGNOSIS:  Chronic thrombocytopenia, likely due to ITP  SUMMARY OF HEMATOLOGIC HISTORY: This is a pleasant 62 year old lady with progressive thrombocytopenia since 2009. She also have her mild leukocytosis and erythrocytosis, likely secondary to smoking. INTERVAL HISTORY: [name] 62 y.o. female returns for further followup. Since last time I saw her, she is attempting to quit smoking. I also recommend calcium with vitamin D supplement. She is taking it. The patient denies any recent signs or symptoms of bleeding such as spontaneous epistaxis, hematuria or hematochezia.  I have reviewed the past medical history, past surgical history, social history and family history with the patient and they are unchanged from previous note.  ALLERGIES:  has No Known Allergies.  MEDICATIONS:  Current Outpatient Prescriptions  Medication Sig Dispense Refill  . acetaminophen (TYLENOL) 500 MG tablet Take 1,000 mg by mouth every 6 (six) hours as needed for moderate pain.      . metoprolol succinate (TOPROL-XL) 50 MG 24 hr tablet       . oxyCODONE (OXY IR/ROXICODONE) 5 MG immediate release tablet Take 5-15 mg by mouth 2 (two) times daily as needed for severe pain.       . Vitamin D, Ergocalciferol, (DRISDOL) 50000 UNITS CAPS capsule Take 50,000 Units by mouth every 7 (seven) days.      . predniSONE (DELTASONE) 20 MG tablet Take 3 tablets (60 mg total) by mouth daily with breakfast.  100 tablet  1  . promethazine (PHENERGAN) 25 MG tablet Take 25 mg by mouth every 6 (six) hours as needed for nausea or vomiting.       No current facility-administered medications for this visit.     REVIEW OF SYSTEMS:   Constitutional: Denies fevers, chills or night sweats All other systems were reviewed with the patient and are negative.  PHYSICAL EXAMINATION: ECOG PERFORMANCE STATUS: 0 - Asymptomatic  Filed Vitals:   08/29/13 1342   BP: 136/80  Pulse: 73  Temp: 99.4 F (37.4 C)  Resp: 21   Filed Weights   08/29/13 1342  Weight: 163 lb 3.2 oz (74.027 kg)    GENERAL:alert, no distress and comfortable Musculoskeletal:no cyanosis of digits and no clubbing . She is wearing a brace NEURO: alert & oriented x 3 with fluent speech, no focal motor/sensory deficits  LABORATORY DATA:  I have reviewed the data as listed No results found for this or any previous visit (from the past 48 hour(s)).  Lab Results  Component Value Date   WBC 10.9* 08/24/2013   HGB 14.5 08/24/2013   HCT 41.7 08/24/2013   MCV 82.7 08/24/2013   PLT 61 Platelet count consistent in citrate* 08/24/2013   I reviewed her peripheral smear. She has absolute thrombocytopenia. There is occasional large platelets. I do not see any platelet clumping. The white blood cell and red blood cell morphology were normal. ASSESSMENT & PLAN:  #1 leukocytosis This is likely due to smoking. I recommend she quit smoking #2 erythrocytosis, resolved Recommend observation only #3 smoking The patient is attempting to quit #4 progressive thrombocytopenia, likely ITP The patient had chronic thrombocytopenia likely due to ITP. With the presence of stress and recent fracture, I suspect her compensation mechanism is lower. Screening test for autoimmune disease and viral infection were normal. I discussed with her the natural history of ITP. We discussed about various options. I think the easiest would be to give a trial of prednisone. I discussed with as  the risk and benefit of prednisone including risk of hyperglycemia, hypertension, insomnia and poor when healing and she agreed to proceed. I will start her on 60 mg by mouth daily and see her back next week for further review. All questions were answered. The patient knows to call the clinic with any problems, questions or concerns. No barriers to learning was detected.  I spent 25 minutes counseling the patient face to  face. The total time spent in the appointment was 40 minutes and more than 50% was on counseling.     [facility], Nike Southers, MD 08/29/2013 2:14 PM

## 2013-09-05 ENCOUNTER — Encounter: Payer: Self-pay | Admitting: Hematology and Oncology

## 2013-09-05 ENCOUNTER — Ambulatory Visit (HOSPITAL_BASED_OUTPATIENT_CLINIC_OR_DEPARTMENT_OTHER): Payer: BC Managed Care – PPO | Admitting: Hematology and Oncology

## 2013-09-05 ENCOUNTER — Telehealth: Payer: Self-pay | Admitting: Hematology and Oncology

## 2013-09-05 ENCOUNTER — Other Ambulatory Visit (HOSPITAL_BASED_OUTPATIENT_CLINIC_OR_DEPARTMENT_OTHER): Payer: BC Managed Care – PPO

## 2013-09-05 VITALS — BP 147/78 | HR 74 | Temp 98.5°F | Resp 18 | Ht <= 58 in | Wt 167.9 lb

## 2013-09-05 DIAGNOSIS — D693 Immune thrombocytopenic purpura: Secondary | ICD-10-CM

## 2013-09-05 DIAGNOSIS — D696 Thrombocytopenia, unspecified: Secondary | ICD-10-CM

## 2013-09-05 DIAGNOSIS — I10 Essential (primary) hypertension: Secondary | ICD-10-CM

## 2013-09-05 DIAGNOSIS — F17200 Nicotine dependence, unspecified, uncomplicated: Secondary | ICD-10-CM

## 2013-09-05 DIAGNOSIS — D72829 Elevated white blood cell count, unspecified: Secondary | ICD-10-CM

## 2013-09-05 HISTORY — DX: Elevated white blood cell count, unspecified: D72.829

## 2013-09-05 LAB — CBC WITH DIFFERENTIAL/PLATELET
BASO%: 0.1 % (ref 0.0–2.0)
Basophils Absolute: 0 10*3/uL (ref 0.0–0.1)
EOS%: 1.4 % (ref 0.0–7.0)
Eosinophils Absolute: 0.3 10*3/uL (ref 0.0–0.5)
HCT: 44.9 % (ref 34.8–46.6)
HGB: 15.4 g/dL (ref 11.6–15.9)
LYMPH%: 9.9 % — ABNORMAL LOW (ref 14.0–49.7)
MCH: 29.2 pg (ref 25.1–34.0)
MCHC: 34.3 g/dL (ref 31.5–36.0)
MCV: 85 fL (ref 79.5–101.0)
MONO#: 0.9 10*3/uL (ref 0.1–0.9)
MONO%: 5.2 % (ref 0.0–14.0)
NEUT#: 14.6 10*3/uL — ABNORMAL HIGH (ref 1.5–6.5)
NEUT%: 83.4 % — ABNORMAL HIGH (ref 38.4–76.8)
Platelets: 99 10*3/uL — ABNORMAL LOW (ref 145–400)
RBC: 5.28 10*6/uL (ref 3.70–5.45)
RDW: 14.8 % — ABNORMAL HIGH (ref 11.2–14.5)
WBC: 17.5 10*3/uL — ABNORMAL HIGH (ref 3.9–10.3)
lymph#: 1.7 10*3/uL (ref 0.9–3.3)
nRBC: 0 % (ref 0–0)

## 2013-09-05 NOTE — Progress Notes (Signed)
[facility] OFFICE PROGRESS NOTE  [name], [name], NP DIAGNOSIS:  ITP, on prednisone  SUMMARY OF HEMATOLOGIC HISTORY: This is a pleasant 62 year old lady with progressive thrombocytopenia since 2009. She also have her mild leukocytosis and erythrocytosis, likely secondary to smoking. On 08/29/2013, I started her on prednisone 60 mg daily for ITP INTERVAL HISTORY: [name] 62 y.o. female returns for further followup. She tolerated prednisone well. The patient denies any recent signs or symptoms of bleeding such as spontaneous epistaxis, hematuria or hematochezia. She denies any bruising. She is attempting to quit smoking, down to 3 cigarettes a day  I have reviewed the past medical history, past surgical history, social history and family history with the patient and they are unchanged from previous note.  ALLERGIES:  has No Known Allergies.  MEDICATIONS:  Current Outpatient Prescriptions  Medication Sig Dispense Refill  . acetaminophen (TYLENOL) 500 MG tablet Take 1,000 mg by mouth every 6 (six) hours as needed for moderate pain.      . metoprolol succinate (TOPROL-XL) 50 MG 24 hr tablet       . oxyCODONE (OXY IR/ROXICODONE) 5 MG immediate release tablet Take 5-15 mg by mouth 2 (two) times daily as needed for severe pain.       . predniSONE (DELTASONE) 20 MG tablet Take 3 tablets (60 mg total) by mouth daily with breakfast.  100 tablet  1  . Vitamin D, Ergocalciferol, (DRISDOL) 50000 UNITS CAPS capsule Take 50,000 Units by mouth every 7 (seven) days.       No current facility-administered medications for this visit.     REVIEW OF SYSTEMS:   Constitutional: Denies fevers, chills or night sweats Behavioral/Psych: Mood is stable, no new changes  All other systems were reviewed with the patient and are negative.  PHYSICAL EXAMINATION: ECOG PERFORMANCE STATUS: 0 - Asymptomatic  Filed Vitals:   09/05/13 1405  BP: 147/78  Pulse: 74  Temp: 98.5 F  (36.9 C)  Resp: 18   Filed Weights   09/05/13 1405  Weight: 167 lb 14.4 oz (76.159 kg)    GENERAL:alert, no distress and comfortable SKIN: skin color, texture, turgor are normal, no rashes or significant lesions NEURO: alert & oriented x 3 with fluent speech, no focal motor/sensory deficits  LABORATORY DATA:  I have reviewed the data as listed Results for orders placed in visit on 09/05/13 (from the past 48 hour(s))  CBC WITH DIFFERENTIAL     Status: Abnormal   Collection Time    09/05/13  1:40 PM      Result Value Range   WBC 17.5 (*) 3.9 - 10.3 10e3/uL   NEUT# 14.6 (*) 1.5 - 6.5 10e3/uL   HGB 15.4  11.6 - 15.9 g/dL   HCT 16.1  09.6 - 04.5 %   Platelets 99 (*) 145 - 400 10e3/uL   MCV 85.0  79.5 - 101.0 fL   MCH 29.2  25.1 - 34.0 pg   MCHC 34.3  31.5 - 36.0 g/dL   RBC 4.09  8.11 - 9.14 10e6/uL   RDW 14.8 (*) 11.2 - 14.5 %   lymph# 1.7  0.9 - 3.3 10e3/uL   MONO# 0.9  0.1 - 0.9 10e3/uL   Eosinophils Absolute 0.3  0.0 - 0.5 10e3/uL   Basophils Absolute 0.0  0.0 - 0.1 10e3/uL   NEUT% 83.4 (*) 38.4 - 76.8 %   LYMPH% 9.9 (*) 14.0 - 49.7 %   MONO% 5.2  0.0 - 14.0 %  EOS% 1.4  0.0 - 7.0 %   BASO% 0.1  0.0 - 2.0 %   nRBC 0  0 - 0 %    Lab Results  Component Value Date   WBC 17.5* 09/05/2013   HGB 15.4 09/05/2013   HCT 44.9 09/05/2013   MCV 85.0 09/05/2013   PLT 99* 09/05/2013    ASSESSMENT & PLAN:  #1 leukocytosis This is likely due to smoking. I recommend she quit smoking #2 erythrocytosis, resolved Recommend observation only #3 smoking The patient is attempting to quit #4 progressive thrombocytopenia, likely ITP She is responding where well to 60 mg of prednisone. I recommend we continue and bring her back next week. Once her platelet count is above 100,000, I will contact her surgeon to provide clearance for surgery and will start initiate prednisone taper once we get her platelet count to stabilize. #5 hypertension She is not symptomatic. She will continue  metoprolol for now. All questions were answered. The patient knows to call the clinic with any problems, questions or concerns. No barriers to learning was detected.  I spent 15 minutes counseling the patient face to face. The total time spent in the appointment was 20 minutes and more than 50% was on counseling.     [facility], Ardean Melroy, MD 09/05/2013 3:23 PM

## 2013-09-05 NOTE — Telephone Encounter (Signed)
gv and printed appt sched and avs for pt for DEc °

## 2013-09-12 ENCOUNTER — Other Ambulatory Visit: Payer: BC Managed Care – PPO

## 2013-09-14 ENCOUNTER — Other Ambulatory Visit (HOSPITAL_BASED_OUTPATIENT_CLINIC_OR_DEPARTMENT_OTHER): Payer: BC Managed Care – PPO

## 2013-09-14 ENCOUNTER — Telehealth: Payer: Self-pay | Admitting: *Deleted

## 2013-09-14 ENCOUNTER — Ambulatory Visit (HOSPITAL_BASED_OUTPATIENT_CLINIC_OR_DEPARTMENT_OTHER): Payer: BC Managed Care – PPO | Admitting: Hematology and Oncology

## 2013-09-14 VITALS — BP 163/82 | HR 75 | Temp 98.5°F | Resp 20 | Ht <= 58 in | Wt 165.1 lb

## 2013-09-14 DIAGNOSIS — D72829 Elevated white blood cell count, unspecified: Secondary | ICD-10-CM

## 2013-09-14 DIAGNOSIS — D696 Thrombocytopenia, unspecified: Secondary | ICD-10-CM

## 2013-09-14 DIAGNOSIS — D693 Immune thrombocytopenic purpura: Secondary | ICD-10-CM

## 2013-09-14 DIAGNOSIS — I10 Essential (primary) hypertension: Secondary | ICD-10-CM

## 2013-09-14 DIAGNOSIS — M549 Dorsalgia, unspecified: Secondary | ICD-10-CM

## 2013-09-14 DIAGNOSIS — F17200 Nicotine dependence, unspecified, uncomplicated: Secondary | ICD-10-CM

## 2013-09-14 LAB — CBC WITH DIFFERENTIAL/PLATELET
BASO%: 0.3 % (ref 0.0–2.0)
Basophils Absolute: 0 10*3/uL (ref 0.0–0.1)
EOS%: 1.2 % (ref 0.0–7.0)
Eosinophils Absolute: 0.2 10*3/uL (ref 0.0–0.5)
HCT: 42.4 % (ref 34.8–46.6)
HGB: 14.3 g/dL (ref 11.6–15.9)
LYMPH%: 17.9 % (ref 14.0–49.7)
MCH: 29.2 pg (ref 25.1–34.0)
MCHC: 33.6 g/dL (ref 31.5–36.0)
MCV: 86.8 fL (ref 79.5–101.0)
MONO#: 1.1 10*3/uL — ABNORMAL HIGH (ref 0.1–0.9)
MONO%: 6.2 % (ref 0.0–14.0)
NEUT#: 13.1 10*3/uL — ABNORMAL HIGH (ref 1.5–6.5)
NEUT%: 74.4 % (ref 38.4–76.8)
Platelets: 60 10*3/uL — ABNORMAL LOW (ref 145–400)
RBC: 4.89 10*6/uL (ref 3.70–5.45)
RDW: 14.9 % — ABNORMAL HIGH (ref 11.2–14.5)
WBC: 17.6 10*3/uL — ABNORMAL HIGH (ref 3.9–10.3)
lymph#: 3.2 10*3/uL (ref 0.9–3.3)

## 2013-09-14 MED ORDER — FOLIC ACID 1 MG PO TABS: 1.0000 mg | ORAL_TABLET | Freq: Every day | ORAL | Status: DC

## 2013-09-14 MED ORDER — VITAMIN B-12 1000 MCG PO TABS: 1000.0000 ug | ORAL_TABLET | Freq: Every day | ORAL | Status: DC

## 2013-09-14 NOTE — Telephone Encounter (Signed)
appts made and printed...td 

## 2013-09-14 NOTE — Progress Notes (Signed)
[facility] OFFICE PROGRESS NOTE  [name], [name], NP DIAGNOSIS:  Chronic ITP  SUMMARY OF HEMATOLOGIC HISTORY: This is a pleasant 62 year old lady with progressive thrombocytopenia since 2009. She also have her mild leukocytosis and erythrocytosis, likely secondary to smoking. On 08/29/2013, I started her on prednisone 60 mg daily for ITP On 09/14/2013, due to lack of response, I increase the prednisone to 80 mg once a day and add B12 and folic acid supplement INTERVAL HISTORY: [name] 62 y.o. female returns for  further followup. She only missed one dose last week. She denies any recent fever, chills, night sweats or abnormal weight loss The patient denies any recent signs or symptoms of bleeding such as spontaneous epistaxis, hematuria or hematochezia.  I have reviewed the past medical history, past surgical history, social history and family history with the patient and they are unchanged from previous note.  ALLERGIES:  has No Known Allergies.  MEDICATIONS:  Current Outpatient Prescriptions  Medication Sig Dispense Refill  . acetaminophen (TYLENOL) 500 MG tablet Take 1,000 mg by mouth every 6 (six) hours as needed for moderate pain.      . metoprolol succinate (TOPROL-XL) 50 MG 24 hr tablet       . oxyCODONE (OXY IR/ROXICODONE) 5 MG immediate release tablet Take 5-15 mg by mouth 2 (two) times daily as needed for severe pain.       . predniSONE (DELTASONE) 20 MG tablet Take 3 tablets (60 mg total) by mouth daily with breakfast.  100 tablet  1  . Vitamin D, Ergocalciferol, (DRISDOL) 50000 UNITS CAPS capsule Take 50,000 Units by mouth every 7 (seven) days.      . folic acid (FOLVITE) 1 MG tablet Take 1 tablet (1 mg total) by mouth daily.  30 tablet  1  . vitamin B-12 (CYANOCOBALAMIN) 1000 MCG tablet Take 1 tablet (1,000 mcg total) by mouth daily.  30 tablet  1   No current facility-administered medications for this visit.     REVIEW OF SYSTEMS:    Constitutional: Denies fevers, chills or night sweats Eyes: Denies blurriness of vision Ears, nose, mouth, throat, and face: Denies mucositis or sore throat Respiratory: Denies cough, dyspnea or wheezes Cardiovascular: Denies palpitation, chest discomfort or lower extremity swelling Gastrointestinal:  Denies nausea, heartburn or change in bowel habits Skin: Denies abnormal skin rashes Lymphatics: Denies new lymphadenopathy or easy bruising Neurological:Denies numbness, tingling or new weaknesses Behavioral/Psych: Mood is stable, no new changes  All other systems were reviewed with the patient and are negative.  PHYSICAL EXAMINATION: ECOG PERFORMANCE STATUS: 0 - Asymptomatic  Filed Vitals:   09/14/13 1350  BP: 163/82  Pulse: 75  Temp: 98.5 F (36.9 C)  Resp: 20   Filed Weights   09/14/13 1350  Weight: 165 lb 1.6 oz (74.889 kg)    GENERAL:alert, no distress and comfortable SKIN: skin color, texture, turgor are normal, no rashes or significant lesions Musculoskeletal:no cyanosis of digits and no clubbing  NEURO: alert & oriented x 3 with fluent speech, no focal motor/sensory deficits  LABORATORY DATA:  I have reviewed the data as listed Results for orders placed in visit on 09/14/13 (from the past 48 hour(s))  CBC WITH DIFFERENTIAL     Status: Abnormal   Collection Time    09/14/13  1:36 PM      Result Value Range   WBC 17.6 (*) 3.9 - 10.3 10e3/uL   NEUT# 13.1 (*) 1.5 - 6.5 10e3/uL   HGB 14.3  11.6 - 15.9 g/dL   HCT 16.1  09.6 - 04.5 %   Platelets 60 (*) 145 - 400 10e3/uL   MCV 86.8  79.5 - 101.0 fL   MCH 29.2  25.1 - 34.0 pg   MCHC 33.6  31.5 - 36.0 g/dL   RBC 4.09  8.11 - 9.14 10e6/uL   RDW 14.9 (*) 11.2 - 14.5 %   lymph# 3.2  0.9 - 3.3 10e3/uL   MONO# 1.1 (*) 0.1 - 0.9 10e3/uL   Eosinophils Absolute 0.2  0.0 - 0.5 10e3/uL   Basophils Absolute 0.0  0.0 - 0.1 10e3/uL   NEUT% 74.4  38.4 - 76.8 %   LYMPH% 17.9  14.0 - 49.7 %   MONO% 6.2  0.0 - 14.0 %   EOS% 1.2   0.0 - 7.0 %   BASO% 0.3  0.0 - 2.0 %    Lab Results  Component Value Date   WBC 17.6* 09/14/2013   HGB 14.3 09/14/2013   HCT 42.4 09/14/2013   MCV 86.8 09/14/2013   PLT 60* 09/14/2013   I reviewed her peripheral smear myself. That is absolute leukocytosis with predominant neutrophilia. There is very rare occasional platelet clumps. Large platelets were observed throughout. Her red blood cell showed normal morphology   ASSESSMENT & PLAN:  #1 leukocytosis This is due to prednisone. Observe only #2 erythrocytosis, resolved Recommend observation only #3 smoking The patient is attempting to quit #4 progressive thrombocytopenia She had stopped responding to prednisone. I reviewed peripheral smear myself and agreed that her platelet count remained low. Looking back on her recent blood work, her B12 level is borderline. I recommend B12 and folic acid supplements and increase the prednisone to 80 mg a day. I plan to see her back next week but if she does not respond to prednisone, we may have to give her IVIG and consider that she is steroid refractory and to start thinking about second line treatment #5 hypertension She is not symptomatic. This is likely due to prednisone. She will continue metoprolol for now. #6 back pain due to recent fracture She will continue vitamin D supplement and pain medicine as needed. Due to severe thrombocytopenia, we will defer surgery until her platelet count is at least above 100,000 and stays stable. All questions were answered. The patient knows to call the clinic with any problems, questions or concerns. No barriers to learning was detected.  I spent 25 minutes counseling the patient face to face. The total time spent in the appointment was 40 minutes and more than 50% was on counseling.     [facility], Ahmar Pickrell, MD 09/14/2013 2:05 PM

## 2013-09-23 ENCOUNTER — Other Ambulatory Visit (HOSPITAL_BASED_OUTPATIENT_CLINIC_OR_DEPARTMENT_OTHER): Payer: BC Managed Care – PPO

## 2013-09-23 ENCOUNTER — Ambulatory Visit (HOSPITAL_BASED_OUTPATIENT_CLINIC_OR_DEPARTMENT_OTHER): Payer: BC Managed Care – PPO | Admitting: Hematology and Oncology

## 2013-09-23 ENCOUNTER — Telehealth: Payer: Self-pay | Admitting: Hematology and Oncology

## 2013-09-23 ENCOUNTER — Encounter: Payer: Self-pay | Admitting: Hematology and Oncology

## 2013-09-23 VITALS — BP 168/69 | HR 79 | Temp 98.8°F | Resp 18 | Ht <= 58 in | Wt 169.4 lb

## 2013-09-23 DIAGNOSIS — D693 Immune thrombocytopenic purpura: Secondary | ICD-10-CM

## 2013-09-23 DIAGNOSIS — D72829 Elevated white blood cell count, unspecified: Secondary | ICD-10-CM

## 2013-09-23 DIAGNOSIS — M549 Dorsalgia, unspecified: Secondary | ICD-10-CM

## 2013-09-23 DIAGNOSIS — F17200 Nicotine dependence, unspecified, uncomplicated: Secondary | ICD-10-CM

## 2013-09-23 DIAGNOSIS — I10 Essential (primary) hypertension: Secondary | ICD-10-CM

## 2013-09-23 LAB — CBC WITH DIFFERENTIAL/PLATELET
BASO%: 0.4 % (ref 0.0–2.0)
Basophils Absolute: 0.1 10*3/uL (ref 0.0–0.1)
EOS%: 1.5 % (ref 0.0–7.0)
Eosinophils Absolute: 0.3 10*3/uL (ref 0.0–0.5)
HCT: 43.5 % (ref 34.8–46.6)
HGB: 14.5 g/dL (ref 11.6–15.9)
LYMPH%: 7.5 % — ABNORMAL LOW (ref 14.0–49.7)
MCH: 29.2 pg (ref 25.1–34.0)
MCHC: 33.4 g/dL (ref 31.5–36.0)
MCV: 87.4 fL (ref 79.5–101.0)
MONO#: 0.9 10*3/uL (ref 0.1–0.9)
MONO%: 4 % (ref 0.0–14.0)
NEUT#: 19.3 10*3/uL — ABNORMAL HIGH (ref 1.5–6.5)
NEUT%: 86.6 % — ABNORMAL HIGH (ref 38.4–76.8)
Platelets: 61 10*3/uL — ABNORMAL LOW (ref 145–400)
RBC: 4.98 10*6/uL (ref 3.70–5.45)
RDW: 15.4 % — ABNORMAL HIGH (ref 11.2–14.5)
WBC: 22.3 10*3/uL — ABNORMAL HIGH (ref 3.9–10.3)
lymph#: 1.7 10*3/uL (ref 0.9–3.3)

## 2013-09-23 NOTE — Telephone Encounter (Signed)
gv and printede appt sched and avs for pt for Jan 2015

## 2013-09-23 NOTE — Progress Notes (Signed)
[place] OFFICE PROGRESS NOTE  [name], Evelyn School, NP DIAGNOSIS:  Steroid refractory ITP  SUMMARY OF HEMATOLOGIC HISTORY: This is a pleasant 63 year old lady with progressive thrombocytopenia since 2009. She also have her mild leukocytosis and erythrocytosis, likely secondary to smoking. On 08/29/2013, I started her on prednisone 60 mg daily for ITP On 09/14/2013, due to lack of response, I increase the prednisone to 80 mg once a day and add N56 and folic acid supplement INTERVAL HISTORY: [name] 63 y.o. female returns for further followup. She denies missing any dosage of prednisone. She does not like to effect of prednisone. The patient denies any recent signs or symptoms of bleeding such as spontaneous epistaxis, hematuria or hematochezia.  I have reviewed the past medical history, past surgical history, social history and family history with the patient and they are unchanged from previous note.  ALLERGIES:  has No Known Allergies.  MEDICATIONS:  Current Outpatient Prescriptions  Medication Sig Dispense Refill  . acetaminophen (TYLENOL) 500 MG tablet Take 1,000 mg by mouth every 6 (six) hours as needed for moderate pain.      . folic acid (FOLVITE) 1 MG tablet Take 1 tablet (1 mg total) by mouth daily.  30 tablet  1  . metoprolol succinate (TOPROL-XL) 50 MG 24 hr tablet       . oxyCODONE (OXY IR/ROXICODONE) 5 MG immediate release tablet Take 5-15 mg by mouth 2 (two) times daily as needed for severe pain.       . predniSONE (DELTASONE) 20 MG tablet Take 3 tablets (60 mg total) by mouth daily with breakfast.  100 tablet  1  . vitamin B-12 (CYANOCOBALAMIN) 1000 MCG tablet Take 1 tablet (1,000 mcg total) by mouth daily.  30 tablet  1  . Vitamin D, Ergocalciferol, (DRISDOL) 50000 UNITS CAPS capsule Take 50,000 Units by mouth every 7 (seven) days.       No current facility-administered medications for this visit.     REVIEW OF SYSTEMS:   Constitutional:  Denies fevers, chills or night sweats Behavioral/Psych: Mood is stable, no new changes  All other systems were reviewed with the patient and are negative.  PHYSICAL EXAMINATION: ECOG PERFORMANCE STATUS: 1 - Symptomatic but completely ambulatory  Filed Vitals:   09/23/13 1359  BP: 168/69  Pulse: 79  Temp: 98.8 F (37.1 C)  Resp: 18   Filed Weights   09/23/13 1359  Weight: 169 lb 6.4 oz (76.839 kg)    GENERAL:alert, no distress and comfortable. She is starting to look cushingoid Musculoskeletal:no cyanosis of digits and no clubbing  NEURO: alert & oriented x 3 with fluent speech, no focal motor/sensory deficits  LABORATORY DATA:  I have reviewed the data as listed Results for orders placed in visit on 09/23/13 (from the past 48 hour(s))  CBC WITH DIFFERENTIAL     Status: Abnormal   Collection Time    09/23/13  1:51 PM      Result Value Range   WBC 22.3 (*) 3.9 - 10.3 10e3/uL   NEUT# 19.3 (*) 1.5 - 6.5 10e3/uL   HGB 14.5  11.6 - 15.9 g/dL   HCT 43.5  34.8 - 46.6 %   Platelets 61 (*) 145 - 400 10e3/uL   MCV 87.4  79.5 - 101.0 fL   MCH 29.2  25.1 - 34.0 pg   MCHC 33.4  31.5 - 36.0 g/dL   RBC 4.98  3.70 - 5.45 10e6/uL   RDW 15.4 (*) 11.2 - 14.5 %  lymph# 1.7  0.9 - 3.3 10e3/uL   MONO# 0.9  0.1 - 0.9 10e3/uL   Eosinophils Absolute 0.3  0.0 - 0.5 10e3/uL   Basophils Absolute 0.1  0.0 - 0.1 10e3/uL   NEUT% 86.6 (*) 38.4 - 76.8 %   LYMPH% 7.5 (*) 14.0 - 49.7 %   MONO% 4.0  0.0 - 14.0 %   EOS% 1.5  0.0 - 7.0 %   BASO% 0.4  0.0 - 2.0 %    Lab Results  Component Value Date   WBC 22.3* 09/23/2013   HGB 14.5 09/23/2013   HCT 43.5 09/23/2013   MCV 87.4 09/23/2013   PLT 61* 09/23/2013   ASSESSMENT & PLAN:  #1 steroid refractory ITP The patient has no response to prednisone. I recommend we start tapering her down. I discussed with her second line treatment including IVIG for short-term gain versus rituximab for longer-term gain. After a long discussion, she is in agreement for  trial of rituximab. I recommend CT scan of the chest abdomen and pelvis to exclude lymphoma before we proceed. I will order the CT scan as soon as possible and plan on starting her on this dose rituximab next week #2 leukocytosis This is to to prednisone. Hopefully we're tapering her dosage, you will improve. She had no signs and symptoms to suggest infection #3 hypertension Deceased due to to prednisone. Hopefully with the taper it would come down #4 tobacco abuse Continue to encourage the patient to quit smoking. She appears to be motivated. #5 recent back pain with fracture Continue conservative management. The patient is not a candidate for surgical intervention until her thrombocytopenia is corrected All questions were answered. The patient knows to call the clinic with any problems, questions or concerns. No barriers to learning was detected.    [facility], [place], MD 09/23/2013 3:41 PM

## 2013-09-28 ENCOUNTER — Ambulatory Visit (HOSPITAL_COMMUNITY)
Admission: RE | Admit: 2013-09-28 | Discharge: 2013-09-28 | Disposition: A | Discharge status: Home / Self Care | Payer: BC Managed Care – PPO | Source: Ambulatory Visit | Attending: Hematology and Oncology | Admitting: Hematology and Oncology

## 2013-09-28 ENCOUNTER — Encounter (HOSPITAL_COMMUNITY): Payer: Self-pay

## 2013-09-28 DIAGNOSIS — D693 Immune thrombocytopenic purpura: Secondary | ICD-10-CM | POA: Insufficient documentation

## 2013-09-28 DIAGNOSIS — K8020 Calculus of gallbladder without cholecystitis without obstruction: Secondary | ICD-10-CM | POA: Insufficient documentation

## 2013-09-28 DIAGNOSIS — D1803 Hemangioma of intra-abdominal structures: Secondary | ICD-10-CM | POA: Insufficient documentation

## 2013-09-28 DIAGNOSIS — D696 Thrombocytopenia, unspecified: Secondary | ICD-10-CM | POA: Insufficient documentation

## 2013-09-28 MED ORDER — IOHEXOL 300 MG/ML  SOLN
100.0000 mL | Freq: Once | INTRAMUSCULAR | Status: AC | PRN
  Administered 2013-09-28: 100 mL via INTRAVENOUS

## 2013-09-29 ENCOUNTER — Other Ambulatory Visit (HOSPITAL_BASED_OUTPATIENT_CLINIC_OR_DEPARTMENT_OTHER): Payer: BC Managed Care – PPO

## 2013-09-29 ENCOUNTER — Ambulatory Visit (HOSPITAL_BASED_OUTPATIENT_CLINIC_OR_DEPARTMENT_OTHER): Payer: BC Managed Care – PPO | Admitting: Hematology and Oncology

## 2013-09-29 ENCOUNTER — Telehealth: Payer: Self-pay | Admitting: Hematology and Oncology

## 2013-09-29 ENCOUNTER — Encounter (INDEPENDENT_AMBULATORY_CARE_PROVIDER_SITE_OTHER): Payer: Self-pay

## 2013-09-29 ENCOUNTER — Encounter: Payer: Self-pay | Admitting: Hematology and Oncology

## 2013-09-29 VITALS — BP 153/84 | HR 101 | Temp 98.4°F | Resp 18 | Ht <= 58 in | Wt 165.0 lb

## 2013-09-29 DIAGNOSIS — D72829 Elevated white blood cell count, unspecified: Secondary | ICD-10-CM

## 2013-09-29 DIAGNOSIS — M549 Dorsalgia, unspecified: Secondary | ICD-10-CM

## 2013-09-29 DIAGNOSIS — I10 Essential (primary) hypertension: Secondary | ICD-10-CM

## 2013-09-29 DIAGNOSIS — D693 Immune thrombocytopenic purpura: Secondary | ICD-10-CM

## 2013-09-29 DIAGNOSIS — F17200 Nicotine dependence, unspecified, uncomplicated: Secondary | ICD-10-CM

## 2013-09-29 DIAGNOSIS — K439 Ventral hernia without obstruction or gangrene: Secondary | ICD-10-CM

## 2013-09-29 DIAGNOSIS — D473 Essential (hemorrhagic) thrombocythemia: Secondary | ICD-10-CM

## 2013-09-29 DIAGNOSIS — G8929 Other chronic pain: Secondary | ICD-10-CM | POA: Insufficient documentation

## 2013-09-29 HISTORY — DX: Dorsalgia, unspecified: M54.9

## 2013-09-29 LAB — CBC WITH DIFFERENTIAL/PLATELET
BASO%: 0.6 % (ref 0.0–2.0)
Basophils Absolute: 0.1 10*3/uL (ref 0.0–0.1)
EOS%: 2.1 % (ref 0.0–7.0)
Eosinophils Absolute: 0.3 10*3/uL (ref 0.0–0.5)
HCT: 46.3 % (ref 34.8–46.6)
HGB: 15.4 g/dL (ref 11.6–15.9)
LYMPH%: 12.5 % — ABNORMAL LOW (ref 14.0–49.7)
MCH: 29.2 pg (ref 25.1–34.0)
MCHC: 33.3 g/dL (ref 31.5–36.0)
MCV: 87.6 fL (ref 79.5–101.0)
MONO#: 0.9 10*3/uL (ref 0.1–0.9)
MONO%: 5.5 % (ref 0.0–14.0)
NEUT#: 13.1 10*3/uL — ABNORMAL HIGH (ref 1.5–6.5)
NEUT%: 79.3 % — ABNORMAL HIGH (ref 38.4–76.8)
Platelets: 58 10*3/uL — ABNORMAL LOW (ref 145–400)
RBC: 5.28 10*6/uL (ref 3.70–5.45)
RDW: 15.5 % — ABNORMAL HIGH (ref 11.2–14.5)
WBC: 16.6 10*3/uL — ABNORMAL HIGH (ref 3.9–10.3)
lymph#: 2.1 10*3/uL (ref 0.9–3.3)

## 2013-09-29 LAB — LACTATE DEHYDROGENASE (CC13): LDH: 208 U/L (ref 125–245)

## 2013-09-29 LAB — COMPREHENSIVE METABOLIC PANEL (CC13)
ALT: 43 U/L (ref 0–55)
AST: 15 U/L (ref 5–34)
Albumin: 4.2 g/dL (ref 3.5–5.0)
Alkaline Phosphatase: 71 U/L (ref 40–150)
Anion Gap: 10 mEq/L (ref 3–11)
BUN: 10.9 mg/dL (ref 7.0–26.0)
CO2: 26 mEq/L (ref 22–29)
Calcium: 9.6 mg/dL (ref 8.4–10.4)
Chloride: 105 mEq/L (ref 98–109)
Creatinine: 0.8 mg/dL (ref 0.6–1.1)
Glucose: 132 mg/dl (ref 70–140)
Potassium: 3.9 mEq/L (ref 3.5–5.1)
Sodium: 142 mEq/L (ref 136–145)
Total Bilirubin: 1.15 mg/dL (ref 0.20–1.20)
Total Protein: 6.6 g/dL (ref 6.4–8.3)

## 2013-09-29 MED ORDER — OXYCODONE HCL 5 MG PO TABS: 5.0000 mg | ORAL_TABLET | Freq: Four times a day (QID) | ORAL | Status: DC | PRN

## 2013-09-29 NOTE — Telephone Encounter (Signed)
gv and printed appt sched an davs for pt for Jan.. °

## 2013-09-29 NOTE — Patient Instructions (Signed)

## 2013-09-29 NOTE — Progress Notes (Signed)
[name] OFFICE PROGRESS NOTE  [name], [name], NP DIAGNOSIS:  Steroid refractory ITP  SUMMARY OF HEMATOLOGIC HISTORY: This is a pleasant 63 year old lady with progressive thrombocytopenia since 2009. She also have her mild leukocytosis and erythrocytosis, likely secondary to smoking. On 08/29/2013, I started her on prednisone 60 mg daily for ITP On 09/14/2013, due to lack of response, I increase the prednisone to 80 mg once a day and add N62 and folic acid supplement On 09/23/2013, she was noted to be refractory to prednisone. I suspect initiating prednisone taper to 40 mg a day INTERVAL HISTORY: [name] 63 y.o. female returns for further followup. She still has severe back pain. The patient denies any recent signs or symptoms of bleeding such as spontaneous epistaxis, hematuria or hematochezia. She denies any recent fever, chills, night sweats or abnormal weight loss  I have reviewed the past medical history, past surgical history, social history and family history with the patient and they are unchanged from previous note.  ALLERGIES:  has No Known Allergies.  MEDICATIONS:  Current Outpatient Prescriptions  Medication Sig Dispense Refill  . acetaminophen (TYLENOL) 500 MG tablet Take 1,000 mg by mouth every 6 (six) hours as needed for moderate pain.      . folic acid (FOLVITE) 1 MG tablet Take 1 tablet (1 mg total) by mouth daily.  30 tablet  1  . metoprolol succinate (TOPROL-XL) 50 MG 24 hr tablet       . oxyCODONE (OXY IR/ROXICODONE) 5 MG immediate release tablet Take 1 tablet (5 mg total) by mouth every 6 (six) hours as needed for severe pain.  90 tablet  0  . predniSONE (DELTASONE) 20 MG tablet Take 3 tablets (60 mg total) by mouth daily with breakfast.  100 tablet  1  . vitamin B-12 (CYANOCOBALAMIN) 1000 MCG tablet Take 1 tablet (1,000 mcg total) by mouth daily.  30 tablet  1  . Vitamin D, Ergocalciferol, (DRISDOL) 50000 UNITS CAPS capsule Take  50,000 Units by mouth every 7 (seven) days.       No current facility-administered medications for this visit.     REVIEW OF SYSTEMS:   Constitutional: Denies fevers, chills or night sweats Eyes: Denies blurriness of vision Ears, nose, mouth, throat, and face: Denies mucositis or sore throat Respiratory: Denies cough, dyspnea or wheezes Cardiovascular: Denies palpitation, chest discomfort or lower extremity swelling Gastrointestinal:  Denies nausea, heartburn or change in bowel habits Skin: Denies abnormal skin rashes Lymphatics: Denies new lymphadenopathy or easy bruising Neurological:Denies numbness, tingling or new weaknesses Behavioral/Psych: Mood is stable, no new changes  All other systems were reviewed with the patient and are negative.  PHYSICAL EXAMINATION: ECOG PERFORMANCE STATUS: 0 - Asymptomatic  Filed Vitals:   09/29/13 1511  BP: 153/84  Pulse: 101  Temp: 98.4 F (36.9 C)  Resp: 18   Filed Weights   09/29/13 1511  Weight: 165 lb (74.844 kg)    GENERAL:alert, no distress and comfortable SKIN: skin color, texture, turgor are normal, no rashes or significant lesions EYES: normal, Conjunctiva are pink and non-injected, sclera clear OROPHARYNX:no exudate, no erythema and lips, buccal mucosa, and tongue normal  NECK: supple, thyroid normal size, non-tender, without nodularity LYMPH:  no palpable lymphadenopathy in the cervical, axillary or inguinal LUNGS: clear to auscultation and percussion with normal breathing effort HEART: regular rate & rhythm and no murmurs and no lower extremity edema ABDOMEN:abdomen soft, non-tender and normal bowel sounds Musculoskeletal:no cyanosis of digits and no clubbing  NEURO: alert & oriented x 3 with fluent speech, no focal motor/sensory deficits  LABORATORY DATA:  I have reviewed the data as listed Results for orders placed in visit on 09/29/13 (from the past 48 hour(s))  CBC WITH DIFFERENTIAL     Status: Abnormal    Collection Time    09/29/13  3:00 PM      Result Value Range   WBC 16.6 (*) 3.9 - 10.3 10e3/uL   NEUT# 13.1 (*) 1.5 - 6.5 10e3/uL   HGB 15.4  11.6 - 15.9 g/dL   HCT 46.3  34.8 - 46.6 %   Platelets 58 (*) 145 - 400 10e3/uL   MCV 87.6  79.5 - 101.0 fL   MCH 29.2  25.1 - 34.0 pg   MCHC 33.3  31.5 - 36.0 g/dL   RBC 5.28  3.70 - 5.45 10e6/uL   RDW 15.5 (*) 11.2 - 14.5 %   lymph# 2.1  0.9 - 3.3 10e3/uL   MONO# 0.9  0.1 - 0.9 10e3/uL   Eosinophils Absolute 0.3  0.0 - 0.5 10e3/uL   Basophils Absolute 0.1  0.0 - 0.1 10e3/uL   NEUT% 79.3 (*) 38.4 - 76.8 %   LYMPH% 12.5 (*) 14.0 - 49.7 %   MONO% 5.5  0.0 - 14.0 %   EOS% 2.1  0.0 - 7.0 %   BASO% 0.6  0.0 - 2.0 %  COMPREHENSIVE METABOLIC PANEL (0000000)     Status: None   Collection Time    09/29/13  3:00 PM      Result Value Range   Sodium 142  136 - 145 mEq/L   Potassium 3.9  3.5 - 5.1 mEq/L   Chloride 105  98 - 109 mEq/L   CO2 26  22 - 29 mEq/L   Glucose 132  70 - 140 mg/dl   BUN 10.9  7.0 - 26.0 mg/dL   Creatinine 0.8  0.6 - 1.1 mg/dL   Total Bilirubin 1.15  0.20 - 1.20 mg/dL   Alkaline Phosphatase 71  40 - 150 U/L   AST 15  5 - 34 U/L   ALT 43  0 - 55 U/L   Total Protein 6.6  6.4 - 8.3 g/dL   Albumin 4.2  3.5 - 5.0 g/dL   Calcium 9.6  8.4 - 10.4 mg/dL   Anion Gap 10  3 - 11 mEq/L  LACTATE DEHYDROGENASE (CC13)     Status: None   Collection Time    09/29/13  3:00 PM      Result Value Range   LDH 208  125 - 245 U/L    Lab Results  Component Value Date   WBC 16.6* 09/29/2013   HGB 15.4 09/29/2013   HCT 46.3 09/29/2013   MCV 87.6 09/29/2013   PLT 58* 09/29/2013    RADIOGRAPHIC STUDIES: I have personally reviewed the radiological images as listed and agreed with the findings in the report. Ct Chest W Contrast  09/28/2013   CLINICAL DATA:  Low platelets, question lymphoma, idiopathic thrombocytopenic purpura  EXAM: CT CHEST, ABDOMEN, AND PELVIS WITH CONTRAST  TECHNIQUE: Multidetector CT imaging of the chest, abdomen and pelvis was  performed following the standard protocol during bolus administration of intravenous contrast.  CONTRAST:  155mL OMNIPAQUE IOHEXOL 300 MG/ML  SOLN  COMPARISON:  DG CHEST 2 VIEW dated 08/05/2013; CT L SPINE W/O CM dated 07/04/2013; CT ABD W/CM dated 10/20/2007; MR ABDOMEN WO/W CM dated 10/27/2007  FINDINGS: CT CHEST FINDINGS  No axillary or  supraclavicular lymphadenopathy. No mediastinal hilar lymphadenopathy. No pericardial fluid. No central pulmonary embolism.  Review of the lung parenchyma demonstrates mild linear atelectasis at the lung bases. No pulmonary nodules  CT ABDOMEN AND PELVIS FINDINGS  Lobular lesion extending from the posterior aspect of the right hepatic lobe measuring 5.5 x 3.8 cm. This corresponds to a 7.5 x 6.0 cm hemangioma on comparison MRI. This hemangiomas has contracted slightly in the interval. There are multiple faceted gallstones within the lumen of the gallbladder. No gallbladder distention. The common bile duct is normal caliber. The pancreas is normal.  The spleen measures 14.1 x 4.6 cm in axial dimension compared to 13.0 x 4.4 cm on prior. The spleen measures 7.4 cm in craniocaudad dimension compared 8.5 cm on prior. The volume of the spleen calculates 1 to 250 cc which is normal.  The stomach, small bowel, and cecum normal. There is a normal appendix. There is a large right inguinal hernia which contains a long segment of nonobstructed small bowel. The hernia right inguinal hernia measures 12.0 x 7.5 cm in axial dimension. The colon is normal.  Abdominal aorta is normal caliber. No retroperitoneal periportal lymphadenopathy. No free fluid the pelvis post hysterectomy. Bladder is normal. Number or section of masses cystic lesion from the pelvis.  IMPRESSION: 1. No evidence of lymphadenopathy within the chest, abdomen, or pelvis to suggest lymphoma. 2. Normal volume spleen. 3. Benign hemangioma in the right hepatic lobe is contracted compared to prior. 4. Large right inguinal hernia  contains a long segment of nonobstructed small bowel.   Electronically Signed   By: Suzy Bouchard M.D.   On: 09/28/2013 16:02    ASSESSMENT & PLAN:  [place] OFFICE PROGRESS NOTE  [name], [name], NP DIAGNOSIS:  Steroid refractory ITP  SUMMARY OF HEMATOLOGIC HISTORY: This is a pleasant 63 year old lady with progressive thrombocytopenia since 2009. She also have her mild leukocytosis and erythrocytosis, likely secondary to smoking. On 08/29/2013, I started her on prednisone 60 mg daily for ITP On 09/14/2013, due to lack of response, I increase the prednisone to 80 mg once a day and add [id number] and folic acid supplement INTERVAL HISTORY: [name] 63 y.o. female returns for further followup. She denies missing any dosage of prednisone. She does not like to effect of prednisone. The patient denies any recent signs or symptoms of bleeding such as spontaneous epistaxis, hematuria or hematochezia.  I have reviewed the past medical history, past surgical history, social history and family history with the patient and they are unchanged from previous note.  ALLERGIES:  has No Known Allergies.  MEDICATIONS:  Current Outpatient Prescriptions  Medication Sig Dispense Refill  . acetaminophen (TYLENOL) 500 MG tablet Take 1,000 mg by mouth every 6 (six) hours as needed for moderate pain.      . folic acid (FOLVITE) 1 MG tablet Take 1 tablet (1 mg total) by mouth daily.  30 tablet  1  . metoprolol succinate (TOPROL-XL) 50 MG 24 hr tablet       . oxyCODONE (OXY IR/ROXICODONE) 5 MG immediate release tablet Take 1 tablet (5 mg total) by mouth every 6 (six) hours as needed for severe pain.  90 tablet  0  . predniSONE (DELTASONE) 20 MG tablet Take 3 tablets (60 mg total) by mouth daily with breakfast.  100 tablet  1  . vitamin B-12 (CYANOCOBALAMIN) 1000 MCG tablet Take 1 tablet (1,000 mcg total) by mouth daily.  30 tablet  1  .  Vitamin D, Ergocalciferol, (DRISDOL) 50000  UNITS CAPS capsule Take 50,000 Units by mouth every 7 (seven) days.       No current facility-administered medications for this visit.     REVIEW OF SYSTEMS:   Constitutional: Denies fevers, chills or night sweats Behavioral/Psych: Mood is stable, no new changes  All other systems were reviewed with the patient and are negative.  PHYSICAL EXAMINATION: ECOG PERFORMANCE STATUS: 1 - Symptomatic but completely ambulatory  Filed Vitals:   09/29/13 1511  BP: 153/84  Pulse: 101  Temp: 98.4 F (36.9 C)  Resp: 18   Filed Weights   09/29/13 1511  Weight: 165 lb (74.844 kg)    GENERAL:alert, no distress and comfortable. She is starting to look cushingoid Musculoskeletal:no cyanosis of digits and no clubbing  NEURO: alert & oriented x 3 with fluent speech, no focal motor/sensory deficits  LABORATORY DATA:  I have reviewed the data as listed Results for orders placed in visit on 09/29/13 (from the past 48 hour(s))  CBC WITH DIFFERENTIAL     Status: Abnormal   Collection Time    09/29/13  3:00 PM      Result Value Range   WBC 16.6 (*) 3.9 - 10.3 10e3/uL   NEUT# 13.1 (*) 1.5 - 6.5 10e3/uL   HGB 15.4  11.6 - 15.9 g/dL   HCT 46.3  34.8 - 46.6 %   Platelets 58 (*) 145 - 400 10e3/uL   MCV 87.6  79.5 - 101.0 fL   MCH 29.2  25.1 - 34.0 pg   MCHC 33.3  31.5 - 36.0 g/dL   RBC 5.28  3.70 - 5.45 10e6/uL   RDW 15.5 (*) 11.2 - 14.5 %   lymph# 2.1  0.9 - 3.3 10e3/uL   MONO# 0.9  0.1 - 0.9 10e3/uL   Eosinophils Absolute 0.3  0.0 - 0.5 10e3/uL   Basophils Absolute 0.1  0.0 - 0.1 10e3/uL   NEUT% 79.3 (*) 38.4 - 76.8 %   LYMPH% 12.5 (*) 14.0 - 49.7 %   MONO% 5.5  0.0 - 14.0 %   EOS% 2.1  0.0 - 7.0 %   BASO% 0.6  0.0 - 2.0 %  COMPREHENSIVE METABOLIC PANEL (0000000)     Status: None   Collection Time    09/29/13  3:00 PM      Result Value Range   Sodium 142  136 - 145 mEq/L   Potassium 3.9  3.5 - 5.1 mEq/L   Chloride 105  98 - 109 mEq/L   CO2 26  22 - 29 mEq/L   Glucose 132  70 - 140 mg/dl    BUN 10.9  7.0 - 26.0 mg/dL   Creatinine 0.8  0.6 - 1.1 mg/dL   Total Bilirubin 1.15  0.20 - 1.20 mg/dL   Alkaline Phosphatase 71  40 - 150 U/L   AST 15  5 - 34 U/L   ALT 43  0 - 55 U/L   Total Protein 6.6  6.4 - 8.3 g/dL   Albumin 4.2  3.5 - 5.0 g/dL   Calcium 9.6  8.4 - 10.4 mg/dL   Anion Gap 10  3 - 11 mEq/L  LACTATE DEHYDROGENASE (CC13)     Status: None   Collection Time    09/29/13  3:00 PM      Result Value Range   LDH 208  125 - 245 U/L    Lab Results  Component Value Date   WBC 16.6* 09/29/2013   HGB  15.4 09/29/2013   HCT 46.3 09/29/2013   MCV 87.6 09/29/2013   PLT 58* 09/29/2013   ASSESSMENT & PLAN:  #1 steroid refractory ITP The patient has no response to prednisone. I recommend we start tapering her down. I discussed with her second line treatment including IVIG for short-term gain versus rituximab for longer-term gain. After a long discussion, she is in agreement for trial of rituximab. We discussed the role of Rituxan. The intent is for cure. Some of the short term side-effects included, though not limited to, risk of fatigue, allergic reaction, fevers, chills, and rare instance of reactivation of hepatitis. The patient is aware that the response rates discussed earlier is not guaranteed.    After a long discussion, patient made an informed decision to proceed with the prescribed plan of care and went ahead to sign the consent form today.   Patient education material was dispensed Her hepatitis B screening test done a month ago was negative. CT scan show no evidence to suggest lymphoma. #2 leukocytosis This is to to prednisone. Hopefully we're tapering her dosage, you will improve. She had no signs and symptoms to suggest infection #3 hypertension Deceased due to to prednisone. Hopefully with the taper it would come down #4 tobacco abuse Continue to encourage the patient to quit smoking. She appears to be motivated. #5 recent back pain with fracture Continue  conservative management. The patient is not a candidate for surgical intervention until her thrombocytopenia is corrected. I filled her prescription of oxycodone #6 abdominal hernia She is not symptomatic. We will observe for now. All questions were answered. The patient knows to call the clinic with any problems, questions or concerns. No barriers to learning was detected.    [facility], [place], MD 09/29/2013 4:17 PM    All questions were answered. The patient knows to call the clinic with any problems, questions or concerns. No barriers to learning was detected.  [facility], [place], MD 09/29/2013 4:17 PM

## 2013-09-30 ENCOUNTER — Other Ambulatory Visit: Payer: Self-pay | Admitting: *Deleted

## 2013-09-30 ENCOUNTER — Ambulatory Visit (HOSPITAL_BASED_OUTPATIENT_CLINIC_OR_DEPARTMENT_OTHER): Payer: BC Managed Care – PPO

## 2013-09-30 VITALS — BP 127/77 | HR 92 | Temp 98.3°F | Resp 18

## 2013-09-30 DIAGNOSIS — Z5112 Encounter for antineoplastic immunotherapy: Secondary | ICD-10-CM

## 2013-09-30 DIAGNOSIS — D693 Immune thrombocytopenic purpura: Secondary | ICD-10-CM

## 2013-09-30 MED ORDER — DIPHENHYDRAMINE HCL 25 MG PO CAPS
ORAL_CAPSULE | ORAL | Status: AC
  Filled 2013-09-30: qty 2

## 2013-09-30 MED ORDER — ACETAMINOPHEN 325 MG PO TABS: 650.0000 mg | ORAL_TABLET | Freq: Once | ORAL | Status: DC

## 2013-09-30 MED ORDER — FAMOTIDINE IN NACL 20-0.9 MG/50ML-% IV SOLN
20.0000 mg | Freq: Once | INTRAVENOUS | Status: AC | PRN
  Administered 2013-09-30: 20 mg via INTRAVENOUS

## 2013-09-30 MED ORDER — ACETAMINOPHEN 325 MG PO TABS
ORAL_TABLET | ORAL | Status: AC
  Filled 2013-09-30: qty 2

## 2013-09-30 MED ORDER — SODIUM CHLORIDE 0.9 % IV SOLN
Freq: Once | INTRAVENOUS | Status: AC
  Administered 2013-09-30: 09:00:00 via INTRAVENOUS

## 2013-09-30 MED ORDER — SODIUM CHLORIDE 0.9 % IV SOLN
375.0000 mg/m2 | Freq: Once | INTRAVENOUS | Status: AC
  Administered 2013-09-30: 700 mg via INTRAVENOUS
  Filled 2013-09-30: qty 70

## 2013-09-30 MED ORDER — DIPHENHYDRAMINE HCL 25 MG PO CAPS
50.0000 mg | ORAL_CAPSULE | Freq: Once | ORAL | Status: AC
  Administered 2013-09-30: 50 mg via ORAL

## 2013-09-30 MED ORDER — DIPHENHYDRAMINE HCL 50 MG/ML IJ SOLN
50.0000 mg | Freq: Once | INTRAMUSCULAR | Status: AC | PRN
  Administered 2013-09-30: 50 mg via INTRAVENOUS

## 2013-09-30 MED ORDER — METHYLPREDNISOLONE SODIUM SUCC 125 MG IJ SOLR
125.0000 mg | Freq: Once | INTRAMUSCULAR | Status: AC | PRN
  Administered 2013-09-30: 125 mg via INTRAVENOUS

## 2013-09-30 NOTE — Patient Instructions (Signed)
[facility] Discharge Instructions for Patients Receiving Chemotherapy  Today you received the following agents Rituxan.    BELOW ARE SYMPTOMS THAT SHOULD BE REPORTED IMMEDIATELY:  *FEVER GREATER THAN 100.5 F  *CHILLS WITH OR WITHOUT FEVER  NAUSEA AND VOMITING THAT IS NOT CONTROLLED WITH YOUR NAUSEA MEDICATION  *UNUSUAL SHORTNESS OF BREATH  *UNUSUAL BRUISING OR BLEEDING  TENDERNESS IN MOUTH AND THROAT WITH OR WITHOUT PRESENCE OF ULCERS  *URINARY PROBLEMS  *BOWEL PROBLEMS  UNUSUAL RASH Items with * indicate a potential emergency and should be followed up as soon as possible.  Feel free to call the clinic you have any questions or concerns. The clinic phone number is [phone].

## 2013-09-30 NOTE — Progress Notes (Signed)
1109  Patient returned from bathroom c/o tightness in throat and difficulting catching breath, "feels like throat clsing up". Rituxan stopped and normal saline started wide open. Dr. Alvy Bimler here to see patient.  Meds given per hypersensitivity reaction protocol to include Benadryl and Solumedrol.  1109 vs 160/84 97 sat 98% 2L [place]   Patient noted to have redness to right eye. Patient states that symptoms are about the same.  Pepcid also given.  1115 vs 148/83 87 sat 99%.   1120 vs 141/74 87 sat 99%   1134vs 148/77 87 sat 99%   1142 Patient states that she still feels the same, feels like a sore throat but is able to swallow and drink fluids.  1200 Dr. Alvy Bimler here to see patient.  Vs 148/77 91 sat 98%  Patient states that she still has some SOB.  1217 Rituxan restarted.  1525  Rituxan completed without any problems.

## 2013-09-30 NOTE — Progress Notes (Signed)
104 - Dr Alvy Bimler to chairside to check on patient.  Per Dr. Alvy Bimler instruction, rate increased to 71 ml/hr.  VS stable - see Epic

## 2013-10-03 ENCOUNTER — Telehealth: Payer: Self-pay | Admitting: *Deleted

## 2013-10-03 NOTE — Telephone Encounter (Signed)
Spoke with patient, denies nausea and vomiting,states she is eating and drinking okay, afebrile, but has been in bed a lot. States she is having muscle  Spasms in her back d/t compression fractures in her spine. States she has oxycodone, but has not taken any. Suggested she take oxycodone for spasms, with food, keep hydrated, and take a stool softner. Patient verbalizes understanding.

## 2013-10-04 ENCOUNTER — Telehealth: Payer: Self-pay | Admitting: *Deleted

## 2013-10-04 NOTE — Telephone Encounter (Signed)
Dr. Alvy Bimler states does not want to prescribe muscle relaxer for pt. .. States concern about sedation especially w/ taking pain medication.  Instructs for pt to try heating pad and conservative measures.. Informed pt of Dr. Calton Dach instructions and she verbalized understanding.

## 2013-10-04 NOTE — Telephone Encounter (Signed)
Pt states having severe muscle spasms in her back and asking if Dr. Alvy Bimler would be willing to prescribe her a muscle relaxer?

## 2013-10-07 ENCOUNTER — Ambulatory Visit (HOSPITAL_BASED_OUTPATIENT_CLINIC_OR_DEPARTMENT_OTHER): Payer: BC Managed Care – PPO

## 2013-10-07 ENCOUNTER — Other Ambulatory Visit (HOSPITAL_BASED_OUTPATIENT_CLINIC_OR_DEPARTMENT_OTHER): Payer: BC Managed Care – PPO

## 2013-10-07 ENCOUNTER — Encounter (INDEPENDENT_AMBULATORY_CARE_PROVIDER_SITE_OTHER): Payer: Self-pay

## 2013-10-07 VITALS — BP 131/73 | HR 80 | Temp 98.7°F | Resp 18

## 2013-10-07 DIAGNOSIS — Z5112 Encounter for antineoplastic immunotherapy: Secondary | ICD-10-CM

## 2013-10-07 DIAGNOSIS — D693 Immune thrombocytopenic purpura: Secondary | ICD-10-CM

## 2013-10-07 LAB — CBC WITH DIFFERENTIAL/PLATELET
BASO%: 0.7 % (ref 0.0–2.0)
Basophils Absolute: 0.2 10*3/uL — ABNORMAL HIGH (ref 0.0–0.1)
EOS%: 2.4 % (ref 0.0–7.0)
Eosinophils Absolute: 0.5 10*3/uL (ref 0.0–0.5)
HCT: 41.8 % (ref 34.8–46.6)
HGB: 14 g/dL (ref 11.6–15.9)
LYMPH%: 8.6 % — ABNORMAL LOW (ref 14.0–49.7)
MCH: 29.5 pg (ref 25.1–34.0)
MCHC: 33.4 g/dL (ref 31.5–36.0)
MCV: 88.3 fL (ref 79.5–101.0)
MONO#: 1 10*3/uL — ABNORMAL HIGH (ref 0.1–0.9)
MONO%: 4.4 % (ref 0.0–14.0)
NEUT#: 18.6 10*3/uL — ABNORMAL HIGH (ref 1.5–6.5)
NEUT%: 83.9 % — ABNORMAL HIGH (ref 38.4–76.8)
Platelets: 74 10*3/uL — ABNORMAL LOW (ref 145–400)
RBC: 4.73 10*6/uL (ref 3.70–5.45)
RDW: 15.5 % — ABNORMAL HIGH (ref 11.2–14.5)
WBC: 22.1 10*3/uL — ABNORMAL HIGH (ref 3.9–10.3)
lymph#: 1.9 10*3/uL (ref 0.9–3.3)

## 2013-10-07 LAB — TECHNOLOGIST REVIEW

## 2013-10-07 MED ORDER — DIPHENHYDRAMINE HCL 25 MG PO CAPS
50.0000 mg | ORAL_CAPSULE | Freq: Once | ORAL | Status: AC
  Administered 2013-10-07: 50 mg via ORAL

## 2013-10-07 MED ORDER — ACETAMINOPHEN 325 MG PO TABS
ORAL_TABLET | ORAL | Status: AC
  Filled 2013-10-07: qty 2

## 2013-10-07 MED ORDER — DIPHENHYDRAMINE HCL 25 MG PO CAPS
ORAL_CAPSULE | ORAL | Status: AC
  Filled 2013-10-07: qty 2

## 2013-10-07 MED ORDER — SODIUM CHLORIDE 0.9 % IV SOLN
Freq: Once | INTRAVENOUS | Status: AC
  Administered 2013-10-07: 09:00:00 via INTRAVENOUS

## 2013-10-07 MED ORDER — ACETAMINOPHEN 325 MG PO TABS
650.0000 mg | ORAL_TABLET | Freq: Once | ORAL | Status: AC
  Administered 2013-10-07: 650 mg via ORAL

## 2013-10-07 MED ORDER — SODIUM CHLORIDE 0.9 % IV SOLN
375.0000 mg/m2 | Freq: Once | INTRAVENOUS | Status: AC
  Administered 2013-10-07: 700 mg via INTRAVENOUS
  Filled 2013-10-07: qty 70

## 2013-10-07 NOTE — Progress Notes (Signed)
Per [place] in pharmarcy Dr. Alvy Bimler does not want extra premeds prior to administration of Rituxan r/t prior possible reaction.

## 2013-10-07 NOTE — Patient Instructions (Signed)
[place] [facility] Discharge Instructions for Patients Receiving Chemotherapy  Today you received the following chemotherapy agents Rituxan.  To help prevent nausea and vomiting after your treatment, we encourage you to take your nausea medication as prescribed.    If you develop nausea and vomiting that is not controlled by your nausea medication, call the clinic.   BELOW ARE SYMPTOMS THAT SHOULD BE REPORTED IMMEDIATELY:  *FEVER GREATER THAN 100.5 F  *CHILLS WITH OR WITHOUT FEVER  NAUSEA AND VOMITING THAT IS NOT CONTROLLED WITH YOUR NAUSEA MEDICATION  *UNUSUAL SHORTNESS OF BREATH  *UNUSUAL BRUISING OR BLEEDING  TENDERNESS IN MOUTH AND THROAT WITH OR WITHOUT PRESENCE OF ULCERS  *URINARY PROBLEMS  *BOWEL PROBLEMS  UNUSUAL RASH Items with * indicate a potential emergency and should be followed up as soon as possible.  Feel free to call the clinic should you have any questions or concerns. The clinic phone number is [phone].  It was my pleasure to take care of you today!  Tina Rashawnda Gaba,RN    

## 2013-10-14 ENCOUNTER — Ambulatory Visit (HOSPITAL_BASED_OUTPATIENT_CLINIC_OR_DEPARTMENT_OTHER): Payer: BC Managed Care – PPO | Admitting: Hematology and Oncology

## 2013-10-14 ENCOUNTER — Other Ambulatory Visit: Payer: Self-pay | Admitting: Hematology and Oncology

## 2013-10-14 ENCOUNTER — Other Ambulatory Visit: Payer: Self-pay | Admitting: *Deleted

## 2013-10-14 ENCOUNTER — Other Ambulatory Visit (HOSPITAL_BASED_OUTPATIENT_CLINIC_OR_DEPARTMENT_OTHER): Payer: BC Managed Care – PPO

## 2013-10-14 ENCOUNTER — Encounter: Payer: Self-pay | Admitting: Hematology and Oncology

## 2013-10-14 ENCOUNTER — Ambulatory Visit (HOSPITAL_BASED_OUTPATIENT_CLINIC_OR_DEPARTMENT_OTHER): Payer: BC Managed Care – PPO

## 2013-10-14 VITALS — BP 134/72 | HR 87 | Temp 98.4°F | Resp 20 | Ht <= 58 in | Wt 163.9 lb

## 2013-10-14 VITALS — BP 126/62 | HR 78 | Temp 98.8°F | Resp 18

## 2013-10-14 DIAGNOSIS — D473 Essential (hemorrhagic) thrombocythemia: Secondary | ICD-10-CM

## 2013-10-14 DIAGNOSIS — I10 Essential (primary) hypertension: Secondary | ICD-10-CM

## 2013-10-14 DIAGNOSIS — D72829 Elevated white blood cell count, unspecified: Secondary | ICD-10-CM

## 2013-10-14 DIAGNOSIS — D693 Immune thrombocytopenic purpura: Secondary | ICD-10-CM

## 2013-10-14 DIAGNOSIS — Z5112 Encounter for antineoplastic immunotherapy: Secondary | ICD-10-CM

## 2013-10-14 DIAGNOSIS — M549 Dorsalgia, unspecified: Secondary | ICD-10-CM

## 2013-10-14 DIAGNOSIS — K458 Other specified abdominal hernia without obstruction or gangrene: Secondary | ICD-10-CM

## 2013-10-14 DIAGNOSIS — F17200 Nicotine dependence, unspecified, uncomplicated: Secondary | ICD-10-CM

## 2013-10-14 LAB — CBC WITH DIFFERENTIAL/PLATELET
BASO%: 0.3 % (ref 0.0–2.0)
Basophils Absolute: 0 10*3/uL (ref 0.0–0.1)
EOS%: 2.2 % (ref 0.0–7.0)
Eosinophils Absolute: 0.3 10*3/uL (ref 0.0–0.5)
HCT: 40.6 % (ref 34.8–46.6)
HGB: 13.9 g/dL (ref 11.6–15.9)
LYMPH%: 13.4 % — ABNORMAL LOW (ref 14.0–49.7)
MCH: 29.7 pg (ref 25.1–34.0)
MCHC: 34.2 g/dL (ref 31.5–36.0)
MCV: 86.8 fL (ref 79.5–101.0)
MONO#: 0.7 10*3/uL (ref 0.1–0.9)
MONO%: 4.5 % (ref 0.0–14.0)
NEUT#: 11.9 10*3/uL — ABNORMAL HIGH (ref 1.5–6.5)
NEUT%: 79.6 % — ABNORMAL HIGH (ref 38.4–76.8)
Platelets: 59 10*3/uL — ABNORMAL LOW (ref 145–400)
RBC: 4.68 10*6/uL (ref 3.70–5.45)
RDW: 16.8 % — ABNORMAL HIGH (ref 11.2–14.5)
WBC: 15 10*3/uL — ABNORMAL HIGH (ref 3.9–10.3)
lymph#: 2 10*3/uL (ref 0.9–3.3)
nRBC: 0 % (ref 0–0)

## 2013-10-14 MED ORDER — DIPHENHYDRAMINE HCL 25 MG PO CAPS
ORAL_CAPSULE | ORAL | Status: AC
  Filled 2013-10-14: qty 2

## 2013-10-14 MED ORDER — ACETAMINOPHEN 325 MG PO TABS
650.0000 mg | ORAL_TABLET | Freq: Once | ORAL | Status: AC
  Administered 2013-10-14: 650 mg via ORAL

## 2013-10-14 MED ORDER — RITUXIMAB CHEMO INJECTION 10 MG/ML
375.0000 mg/m2 | Freq: Once | INTRAVENOUS | Status: AC
  Administered 2013-10-14: 700 mg via INTRAVENOUS
  Filled 2013-10-14: qty 70

## 2013-10-14 MED ORDER — ACETAMINOPHEN 325 MG PO TABS
ORAL_TABLET | ORAL | Status: AC
  Filled 2013-10-14: qty 2

## 2013-10-14 MED ORDER — DIPHENHYDRAMINE HCL 25 MG PO CAPS
50.0000 mg | ORAL_CAPSULE | Freq: Once | ORAL | Status: AC
  Administered 2013-10-14: 50 mg via ORAL

## 2013-10-14 MED ORDER — OXYCODONE HCL 5 MG PO TABS: 5.0000 mg | ORAL_TABLET | Freq: Four times a day (QID) | ORAL | Status: DC | PRN

## 2013-10-14 MED ORDER — SODIUM CHLORIDE 0.9 % IV SOLN
Freq: Once | INTRAVENOUS | Status: AC
  Administered 2013-10-14: 09:00:00 via INTRAVENOUS

## 2013-10-14 NOTE — Progress Notes (Signed)
[facility] OFFICE PROGRESS NOTE  Maldonado, Evelyn School, [name] DIAGNOSIS:  Steroid refractory ITP  SUMMARY OF HEMATOLOGIC HISTORY: This is a pleasant 63 year old lady with progressive thrombocytopenia since 2009. She also have her mild leukocytosis and erythrocytosis, likely secondary to smoking. On 08/29/2013, I started her on prednisone 60 mg daily for ITP On 09/14/2013, due to lack of response, I increase the prednisone to 80 mg once a day and add J00 and folic acid supplement On 09/23/2013, she was noted to be refractory to prednisone. I suspect initiating prednisone taper to 40 mg a day On 09/29/2013, prednisone dose was further reduced to 20 mg daily and then 10 mg daily. On 09/30/2013, she was started on rituximab weekly On 10/14/2013, I continue steroid taper to 5 mg daily. INTERVAL HISTORY: [name] 63 y.o. female returns for further followup. She said her persistent back pain but it is improving. She usually takes about 2-3 oxycodone a day for back pain. The patient denies any recent signs or symptoms of bleeding such as spontaneous epistaxis, hematuria or hematochezia. She denies any recent fever, chills, night sweats or abnormal weight loss   I have reviewed the past medical history, past surgical history, social history and family history with the patient and they are unchanged from previous note.  ALLERGIES:  has No Known Allergies.  MEDICATIONS:  Current Outpatient Prescriptions  Medication Sig Dispense Refill  . acetaminophen (TYLENOL) 500 MG tablet Take 1,000 mg by mouth every 6 (six) hours as needed for moderate pain.      . folic acid (FOLVITE) 1 MG tablet Take 1 tablet (1 mg total) by mouth daily.  30 tablet  1  . metoprolol succinate (TOPROL-XL) 50 MG 24 hr tablet       . oxyCODONE (OXY IR/ROXICODONE) 5 MG immediate release tablet Take 1 tablet (5 mg total) by mouth every 6 (six) hours as needed for severe pain.  90 tablet  0  . predniSONE  (DELTASONE) 20 MG tablet Take 3 tablets (60 mg total) by mouth daily with breakfast.  100 tablet  1  . vitamin B-12 (CYANOCOBALAMIN) 1000 MCG tablet Take 1 tablet (1,000 mcg total) by mouth daily.  30 tablet  1  . Vitamin D, Ergocalciferol, (DRISDOL) 50000 UNITS CAPS capsule Take 50,000 Units by mouth every 7 (seven) days.       No current facility-administered medications for this visit.     REVIEW OF SYSTEMS:   Constitutional: Denies fevers, chills or night sweats Lymphatics: Denies new lymphadenopathy or easy bruising Neurological:Denies numbness, tingling or new weaknesses Behavioral/Psych: Mood is stable, no new changes  All other systems were reviewed with the patient and are negative.  PHYSICAL EXAMINATION: ECOG PERFORMANCE STATUS: 1 - Symptomatic but completely ambulatory  Filed Vitals:   10/14/13 0841  BP: 134/72  Pulse: 87  Temp: 98.4 F (36.9 C)  Resp: 20   Filed Weights   10/14/13 0841  Weight: 163 lb 14.4 oz (74.345 kg)    GENERAL:alert, no distress and comfortable SKIN: skin color, texture, turgor are normal, no rashes or significant lesions EYES: normal, Conjunctiva are pink and non-injected, sclera clear Musculoskeletal:no cyanosis of digits and no clubbing  NEURO: alert & oriented x 3 with fluent speech, no focal motor/sensory deficits  LABORATORY DATA:  I have reviewed the data as listed Results for orders placed in visit on 10/14/13 (from the past 48 hour(s))  CBC WITH DIFFERENTIAL     Status: Abnormal   Collection Time  10/14/13  8:25 AM      Result Value Range   WBC 15.0 (*) 3.9 - 10.3 10e3/uL   NEUT# 11.9 (*) 1.5 - 6.5 10e3/uL   HGB 13.9  11.6 - 15.9 g/dL   HCT 40.6  34.8 - 46.6 %   Platelets 59 (*) 145 - 400 10e3/uL   MCV 86.8  79.5 - 101.0 fL   MCH 29.7  25.1 - 34.0 pg   MCHC 34.2  31.5 - 36.0 g/dL   RBC 4.68  3.70 - 5.45 10e6/uL   RDW 16.8 (*) 11.2 - 14.5 %   lymph# 2.0  0.9 - 3.3 10e3/uL   MONO# 0.7  0.1 - 0.9 10e3/uL   Eosinophils  Absolute 0.3  0.0 - 0.5 10e3/uL   Basophils Absolute 0.0  0.0 - 0.1 10e3/uL   NEUT% 79.6 (*) 38.4 - 76.8 %   LYMPH% 13.4 (*) 14.0 - 49.7 %   MONO% 4.5  0.0 - 14.0 %   EOS% 2.2  0.0 - 7.0 %   BASO% 0.3  0.0 - 2.0 %   nRBC 0  0 - 0 %    Lab Results  Component Value Date   WBC 15.0* 10/14/2013   HGB 13.9 10/14/2013   HCT 40.6 10/14/2013   MCV 86.8 10/14/2013   PLT 59* 10/14/2013   ASSESSMENT & PLAN:  #1 steroid refractory ITP The patient has no response to prednisone. I recommend we start tapering her down her prednisone to 5 mg daily and then discontinue Her hepatitis B screening test done a month ago was negative. CT scan show no evidence to suggest lymphoma. She was started on weekly rituximab since January 16th, 2015. I informed the patient response rates typically does not begin until 4-6 weeks of the rituximab infusion. I plan to see her back in 3 weeks' time to assess her platelet count. #2 leukocytosis This is to to prednisone. She had no signs and symptoms to suggest infection #3 hypertension He continued to improve since we initiated prednisone taper  #4 tobacco abuse Continue to encourage the patient to quit smoking. She appears to be motivated. #5 recent back pain with fracture Continue conservative management. The patient is not a candidate for surgical intervention until her thrombocytopenia is corrected. I filled her prescription of oxycodone #6 abdominal hernia She is not symptomatic. We will observe for now. All questions were answered. The patient knows to call the clinic with any problems, questions or concerns. No barriers to learning was detected.   I spent 25 minutes counseling the patient face to face. The total time spent in the appointment was 40 minutes and more than 50% was on counseling.     [place], [place], MD 10/14/2013 9:07 AM

## 2013-10-14 NOTE — Patient Instructions (Signed)
[facility] Discharge Instructions for Patients Receiving Chemotherapy  Today you received the following chemotherapy agents; Rituxan.     BELOW ARE SYMPTOMS THAT SHOULD BE REPORTED IMMEDIATELY:  *FEVER GREATER THAN 100.5 F  *CHILLS WITH OR WITHOUT FEVER  NAUSEA AND VOMITING THAT IS NOT CONTROLLED WITH YOUR NAUSEA MEDICATION  *UNUSUAL SHORTNESS OF BREATH  *UNUSUAL BRUISING OR BLEEDING  TENDERNESS IN MOUTH AND THROAT WITH OR WITHOUT PRESENCE OF ULCERS  *URINARY PROBLEMS  *BOWEL PROBLEMS  UNUSUAL RASH Items with * indicate a potential emergency and should be followed up as soon as possible.  Feel free to call the clinic you have any questions or concerns. The clinic phone number is [phone].    

## 2013-10-17 ENCOUNTER — Telehealth: Payer: Self-pay | Admitting: Hematology and Oncology

## 2013-10-17 NOTE — Telephone Encounter (Signed)
Gave pt appt for lab,md and chemo for February 2015 °

## 2013-10-21 ENCOUNTER — Other Ambulatory Visit (HOSPITAL_BASED_OUTPATIENT_CLINIC_OR_DEPARTMENT_OTHER): Payer: BC Managed Care – PPO

## 2013-10-21 ENCOUNTER — Ambulatory Visit (HOSPITAL_BASED_OUTPATIENT_CLINIC_OR_DEPARTMENT_OTHER): Payer: BC Managed Care – PPO

## 2013-10-21 VITALS — BP 124/72 | HR 80 | Temp 97.2°F | Resp 18

## 2013-10-21 DIAGNOSIS — D693 Immune thrombocytopenic purpura: Secondary | ICD-10-CM

## 2013-10-21 DIAGNOSIS — Z5112 Encounter for antineoplastic immunotherapy: Secondary | ICD-10-CM

## 2013-10-21 DIAGNOSIS — D473 Essential (hemorrhagic) thrombocythemia: Secondary | ICD-10-CM

## 2013-10-21 LAB — CBC WITH DIFFERENTIAL/PLATELET
BASO%: 0.7 % (ref 0.0–2.0)
Basophils Absolute: 0.1 10*3/uL (ref 0.0–0.1)
EOS%: 3.4 % (ref 0.0–7.0)
Eosinophils Absolute: 0.4 10*3/uL (ref 0.0–0.5)
HCT: 38.8 % (ref 34.8–46.6)
HGB: 13.3 g/dL (ref 11.6–15.9)
LYMPH%: 18.2 % (ref 14.0–49.7)
MCH: 30 pg (ref 25.1–34.0)
MCHC: 34.3 g/dL (ref 31.5–36.0)
MCV: 87.4 fL (ref 79.5–101.0)
MONO#: 0.9 10*3/uL (ref 0.1–0.9)
MONO%: 7.8 % (ref 0.0–14.0)
NEUT#: 7.8 10*3/uL — ABNORMAL HIGH (ref 1.5–6.5)
NEUT%: 69.9 % (ref 38.4–76.8)
Platelets: 57 10*3/uL — ABNORMAL LOW (ref 145–400)
RBC: 4.44 10*6/uL (ref 3.70–5.45)
RDW: 16.6 % — ABNORMAL HIGH (ref 11.2–14.5)
WBC: 11.2 10*3/uL — ABNORMAL HIGH (ref 3.9–10.3)
lymph#: 2 10*3/uL (ref 0.9–3.3)

## 2013-10-21 MED ORDER — DIPHENHYDRAMINE HCL 25 MG PO CAPS
ORAL_CAPSULE | ORAL | Status: AC
  Filled 2013-10-21: qty 1

## 2013-10-21 MED ORDER — RITUXIMAB CHEMO INJECTION 10 MG/ML
375.0000 mg/m2 | Freq: Once | INTRAVENOUS | Status: AC
  Administered 2013-10-21: 700 mg via INTRAVENOUS
  Filled 2013-10-21: qty 70

## 2013-10-21 MED ORDER — ACETAMINOPHEN 325 MG PO TABS
ORAL_TABLET | ORAL | Status: AC
  Filled 2013-10-21: qty 2

## 2013-10-21 MED ORDER — DIPHENHYDRAMINE HCL 25 MG PO CAPS
ORAL_CAPSULE | ORAL | Status: AC
  Filled 2013-10-21: qty 2

## 2013-10-21 MED ORDER — DIPHENHYDRAMINE HCL 25 MG PO CAPS
50.0000 mg | ORAL_CAPSULE | Freq: Once | ORAL | Status: AC
  Administered 2013-10-21: 50 mg via ORAL

## 2013-10-21 MED ORDER — SODIUM CHLORIDE 0.9 % IV SOLN
Freq: Once | INTRAVENOUS | Status: AC
  Administered 2013-10-21: 09:00:00 via INTRAVENOUS

## 2013-10-21 MED ORDER — ACETAMINOPHEN 325 MG PO TABS
650.0000 mg | ORAL_TABLET | Freq: Once | ORAL | Status: AC
  Administered 2013-10-21: 650 mg via ORAL

## 2013-10-21 NOTE — Patient Instructions (Addendum)
[facility] Discharge Instructions for Patients Receiving Chemotherapy  Today you received the following chemotherapy agents: Rituxan   To help prevent nausea and vomiting after your treatment, we encourage you to take your nausea medication as prescribed by your physician.    If you develop nausea and vomiting that is not controlled by your nausea medication, call the clinic.   BELOW ARE SYMPTOMS THAT SHOULD BE REPORTED IMMEDIATELY:  *FEVER GREATER THAN 100.5 F  *CHILLS WITH OR WITHOUT FEVER  NAUSEA AND VOMITING THAT IS NOT CONTROLLED WITH YOUR NAUSEA MEDICATION  *UNUSUAL SHORTNESS OF BREATH  *UNUSUAL BRUISING OR BLEEDING  TENDERNESS IN MOUTH AND THROAT WITH OR WITHOUT PRESENCE OF ULCERS  *URINARY PROBLEMS  *BOWEL PROBLEMS  UNUSUAL RASH Items with * indicate a potential emergency and should be followed up as soon as possible.  Feel free to call the clinic you have any questions or concerns. The clinic phone number is [phone].    

## 2013-10-21 NOTE — Progress Notes (Signed)
Pt. arrived by herself. Stated that she is in pain with movement. Once settled in chair, pt. voiced improvement with pain level and watching T.V.  Taking Oxycodone at home  and effective as stated. Last pain med taken at 0730am today. Pt. Will not be due until 13:30pm today. Hx of back injury and back surgery on hold due to low plts. counts.  Pt. denied any bleeding.  Will monitor patient closely due to hx of rituxan reaction.  HL  1152hr. Pt. Sleeping quietly without signs of pain or distress.

## 2013-11-03 ENCOUNTER — Telehealth: Payer: Self-pay | Admitting: Hematology and Oncology

## 2013-11-03 NOTE — Telephone Encounter (Signed)
pt called to r/s 2/20 appt to 2/26

## 2013-11-04 ENCOUNTER — Ambulatory Visit: Payer: BC Managed Care – PPO | Admitting: Hematology and Oncology

## 2013-11-04 ENCOUNTER — Other Ambulatory Visit: Payer: BC Managed Care – PPO

## 2013-11-09 ENCOUNTER — Telehealth: Payer: Self-pay | Admitting: Hematology and Oncology

## 2013-11-09 NOTE — Telephone Encounter (Signed)
Pt called and r/s appt for tomorrow due to weather pt will now see MD on 3/4 lab and MD

## 2013-11-10 ENCOUNTER — Other Ambulatory Visit: Payer: BC Managed Care – PPO

## 2013-11-10 ENCOUNTER — Ambulatory Visit: Payer: BC Managed Care – PPO | Admitting: Hematology and Oncology

## 2013-11-15 ENCOUNTER — Other Ambulatory Visit: Payer: Self-pay | Admitting: Hematology and Oncology

## 2013-11-16 ENCOUNTER — Encounter: Payer: Self-pay | Admitting: Hematology and Oncology

## 2013-11-16 ENCOUNTER — Ambulatory Visit (HOSPITAL_BASED_OUTPATIENT_CLINIC_OR_DEPARTMENT_OTHER): Payer: BC Managed Care – PPO | Admitting: Hematology and Oncology

## 2013-11-16 ENCOUNTER — Other Ambulatory Visit (HOSPITAL_BASED_OUTPATIENT_CLINIC_OR_DEPARTMENT_OTHER): Payer: BC Managed Care – PPO

## 2013-11-16 ENCOUNTER — Telehealth: Payer: Self-pay | Admitting: Hematology and Oncology

## 2013-11-16 VITALS — BP 154/71 | HR 81 | Temp 98.1°F | Resp 19 | Ht <= 58 in | Wt 155.8 lb

## 2013-11-16 DIAGNOSIS — K458 Other specified abdominal hernia without obstruction or gangrene: Secondary | ICD-10-CM

## 2013-11-16 DIAGNOSIS — M62838 Other muscle spasm: Secondary | ICD-10-CM

## 2013-11-16 DIAGNOSIS — F17200 Nicotine dependence, unspecified, uncomplicated: Secondary | ICD-10-CM

## 2013-11-16 DIAGNOSIS — I10 Essential (primary) hypertension: Secondary | ICD-10-CM

## 2013-11-16 DIAGNOSIS — D72829 Elevated white blood cell count, unspecified: Secondary | ICD-10-CM

## 2013-11-16 DIAGNOSIS — M549 Dorsalgia, unspecified: Secondary | ICD-10-CM

## 2013-11-16 DIAGNOSIS — R252 Cramp and spasm: Secondary | ICD-10-CM

## 2013-11-16 DIAGNOSIS — D693 Immune thrombocytopenic purpura: Secondary | ICD-10-CM

## 2013-11-16 HISTORY — DX: Cramp and spasm: R25.2

## 2013-11-16 LAB — BASIC METABOLIC PANEL (CC13)
Anion Gap: 9 mEq/L (ref 3–11)
BUN: 12.1 mg/dL (ref 7.0–26.0)
CO2: 24 mEq/L (ref 22–29)
Calcium: 9.7 mg/dL (ref 8.4–10.4)
Chloride: 108 mEq/L (ref 98–109)
Creatinine: 0.7 mg/dL (ref 0.6–1.1)
Glucose: 114 mg/dl (ref 70–140)
Potassium: 4.1 mEq/L (ref 3.5–5.1)
Sodium: 140 mEq/L (ref 136–145)

## 2013-11-16 LAB — CBC WITH DIFFERENTIAL/PLATELET
BASO%: 0.6 % (ref 0.0–2.0)
Basophils Absolute: 0.1 10*3/uL (ref 0.0–0.1)
EOS%: 3.7 % (ref 0.0–7.0)
Eosinophils Absolute: 0.5 10*3/uL (ref 0.0–0.5)
HCT: 42.2 % (ref 34.8–46.6)
HGB: 14.1 g/dL (ref 11.6–15.9)
LYMPH%: 11.7 % — ABNORMAL LOW (ref 14.0–49.7)
MCH: 30 pg (ref 25.1–34.0)
MCHC: 33.4 g/dL (ref 31.5–36.0)
MCV: 89.7 fL (ref 79.5–101.0)
MONO#: 0.6 10*3/uL (ref 0.1–0.9)
MONO%: 4.6 % (ref 0.0–14.0)
NEUT#: 9.8 10*3/uL — ABNORMAL HIGH (ref 1.5–6.5)
NEUT%: 79.4 % — ABNORMAL HIGH (ref 38.4–76.8)
Platelets: 74 10*3/uL — ABNORMAL LOW (ref 145–400)
RBC: 4.71 10*6/uL (ref 3.70–5.45)
RDW: 15.5 % — ABNORMAL HIGH (ref 11.2–14.5)
WBC: 12.3 10*3/uL — ABNORMAL HIGH (ref 3.9–10.3)
lymph#: 1.4 10*3/uL (ref 0.9–3.3)

## 2013-11-16 MED ORDER — BACLOFEN 10 MG PO TABS
10.0000 mg | ORAL_TABLET | Freq: Three times a day (TID) | ORAL | Status: DC
Start: 2013-11-16 — End: 2014-01-11

## 2013-11-16 MED ORDER — MORPHINE SULFATE 15 MG PO TABS: 15.0000 mg | ORAL_TABLET | Freq: Four times a day (QID) | ORAL | Status: DC | PRN

## 2013-11-16 NOTE — Telephone Encounter (Signed)
gave pt appt for lab and MD for April 2015 °

## 2013-11-16 NOTE — Progress Notes (Signed)
[place] OFFICE PROGRESS NOTE  [name], [name], NP DIAGNOSIS:  Chronic thrombocytopenia secondary to ITP, steroid refractory  SUMMARY OF HEMATOLOGIC HISTORY: This is a pleasant 63 year old lady with progressive thrombocytopenia since 2009. She also have her mild leukocytosis and erythrocytosis, likely secondary to smoking. On 08/29/2013, I started her on prednisone 60 mg daily for ITP On 09/14/2013, due to lack of response, I increase the prednisone to 80 mg once a day and add [id number] and folic acid supplement On 09/23/2013, she was noted to be refractory to prednisone. I suspect initiating prednisone taper to 40 mg a day On 09/29/2013, prednisone dose was further reduced to 20 mg daily and then 10 mg daily. On 09/30/2013, she was started on rituximab weekly, completed treatment by February 6th, 2015. On 10/14/2013, I continue steroid taper to 5 mg daily and the patient completed steroid taper by mid-February 2015. INTERVAL HISTORY: [name] 63 y.o. female returns for further followup. She still has a lot of back pain and muscle spasm. The patient denies any recent signs or symptoms of bleeding such as spontaneous epistaxis, hematuria or hematochezia.  I have reviewed the past medical history, past surgical history, social history and family history with the patient and they are unchanged from previous note.  ALLERGIES:  has No Known Allergies.  MEDICATIONS:  Current Outpatient Prescriptions  Medication Sig Dispense Refill  . acetaminophen (TYLENOL) 500 MG tablet Take 1,000 mg by mouth every 6 (six) hours as needed for moderate pain.      . folic acid (FOLVITE) 1 MG tablet Take 1 tablet (1 mg total) by mouth daily.  30 tablet  1  . metoprolol succinate (TOPROL-XL) 50 MG 24 hr tablet       . vitamin B-12 (CYANOCOBALAMIN) 1000 MCG tablet Take 1 tablet (1,000 mcg total) by mouth daily.  30 tablet  1  . Vitamin D, Ergocalciferol, (DRISDOL) 50000 UNITS CAPS  capsule Take 50,000 Units by mouth every 7 (seven) days.      . baclofen (LIORESAL) 10 MG tablet Take 1 tablet (10 mg total) by mouth 3 (three) times daily.  60 each  0  . morphine (MSIR) 15 MG tablet Take 1 tablet (15 mg total) by mouth every 6 (six) hours as needed for severe pain.  90 tablet  0  . oxyCODONE (OXY IR/ROXICODONE) 5 MG immediate release tablet Take 1 tablet (5 mg total) by mouth every 6 (six) hours as needed for severe pain.  90 tablet  0   No current facility-administered medications for this visit.     REVIEW OF SYSTEMS:   Constitutional: Denies fevers, chills or night sweats Eyes: Denies blurriness of vision Ears, nose, mouth, throat, and face: Denies mucositis or sore throat Respiratory: Denies cough, dyspnea or wheezes Cardiovascular: Denies palpitation, chest discomfort or lower extremity swelling Gastrointestinal:  Denies nausea, heartburn or change in bowel habits Skin: Denies abnormal skin rashes Lymphatics: Denies new lymphadenopathy or easy bruising Neurological:Denies numbness, tingling or new weaknesses Behavioral/Psych: Mood is stable, no new changes  All other systems were reviewed with the patient and are negative.  PHYSICAL EXAMINATION: ECOG PERFORMANCE STATUS: 1 - Symptomatic but completely ambulatory  Filed Vitals:   11/16/13 1326  BP: 154/71  Pulse: 81  Temp: 98.1 F (36.7 C)  Resp: 19   Filed Weights   11/16/13 1326  Weight: 155 lb 12.8 oz (70.67 kg)    GENERAL:alert, no distress and comfortable. Examination is limited due to her thoracic brace  SKIN: skin color, texture, turgor are normal, no rashes or significant lesions EYES: normal, Conjunctiva are pink and non-injected, sclera clear OROPHARYNX:no exudate, no erythema and lips, buccal mucosa, and tongue normal  Musculoskeletal:no cyanosis of digits and no clubbing  NEURO: alert & oriented x 3 with fluent speech, no focal motor/sensory deficits  LABORATORY DATA:  I have reviewed the  data as listed Results for orders placed in visit on 11/16/13 (from the past 48 hour(s))  BASIC METABOLIC PANEL (PR91)     Status: None   Collection Time    11/16/13 12:38 PM      Result Value Ref Range   Sodium 140  136 - 145 mEq/L   Potassium 4.1  3.5 - 5.1 mEq/L   Chloride 108  98 - 109 mEq/L   CO2 24  22 - 29 mEq/L   Glucose 114  70 - 140 mg/dl   BUN 12.1  7.0 - 26.0 mg/dL   Creatinine 0.7  0.6 - 1.1 mg/dL   Calcium 9.7  8.4 - 10.4 mg/dL   Anion Gap 9  3 - 11 mEq/L  CBC WITH DIFFERENTIAL     Status: Abnormal   Collection Time    11/16/13 12:38 PM      Result Value Ref Range   WBC 12.3 (*) 3.9 - 10.3 10e3/uL   NEUT# 9.8 (*) 1.5 - 6.5 10e3/uL   HGB 14.1  11.6 - 15.9 g/dL   HCT 42.2  34.8 - 46.6 %   Platelets 74 (*) 145 - 400 10e3/uL   MCV 89.7  79.5 - 101.0 fL   MCH 30.0  25.1 - 34.0 pg   MCHC 33.4  31.5 - 36.0 g/dL   RBC 4.71  3.70 - 5.45 10e6/uL   RDW 15.5 (*) 11.2 - 14.5 %   lymph# 1.4  0.9 - 3.3 10e3/uL   MONO# 0.6  0.1 - 0.9 10e3/uL   Eosinophils Absolute 0.5  0.0 - 0.5 10e3/uL   Basophils Absolute 0.1  0.0 - 0.1 10e3/uL   NEUT% 79.4 (*) 38.4 - 76.8 %   LYMPH% 11.7 (*) 14.0 - 49.7 %   MONO% 4.6  0.0 - 14.0 %   EOS% 3.7  0.0 - 7.0 %   BASO% 0.6  0.0 - 2.0 %    Lab Results  Component Value Date   WBC 12.3* 11/16/2013   HGB 14.1 11/16/2013   HCT 42.2 11/16/2013   MCV 89.7 11/16/2013   PLT 74* 11/16/2013   ASSESSMENT & PLAN:  #1 steroid refractory ITP The patient has no response to prednisone. CT scan show no evidence to suggest lymphoma. She was started on weekly rituximab since January 16th, 2015. I informed the patient response rates typically does not begin until 4-6 weeks of the rituximab infusion. I plan to see her back in 4 weeks' time to assess her platelet count. If her platelet count failed to improve, we also discussed about other form of treatment such as Promacta and N plate. #2 leukocytosis This is due to smoking. She had no signs and symptoms to suggest  infection #3 hypertension This has improved since we stop prednisone  #4 tobacco abuse Continue to encourage the patient to quit smoking. She appears to be motivated. #5 recent back pain with fracture Continue conservative management. The patient is not a candidate for surgical intervention until her thrombocytopenia is corrected. I filled her prescription of pain medicine #6 abdominal hernia She is not symptomatic. We will observe for now. #  7 muscle spasm I gave her prescription muscle relaxant. I warned her about risk of medication interaction including excessive risk of sedation and constipation. All questions were answered. The patient knows to call the clinic with any problems, questions or concerns. No barriers to learning was detected.  I spent 25 minutes counseling the patient face to face. The total time spent in the appointment was 40 minutes and more than 50% was on counseling.     [facility], [employer], MD 11/16/2013 3:40 PM

## 2013-12-20 ENCOUNTER — Ambulatory Visit (HOSPITAL_BASED_OUTPATIENT_CLINIC_OR_DEPARTMENT_OTHER): Payer: BC Managed Care – PPO | Admitting: Hematology and Oncology

## 2013-12-20 ENCOUNTER — Telehealth: Payer: Self-pay | Admitting: Hematology and Oncology

## 2013-12-20 ENCOUNTER — Encounter: Payer: Self-pay | Admitting: Hematology and Oncology

## 2013-12-20 ENCOUNTER — Other Ambulatory Visit (HOSPITAL_BASED_OUTPATIENT_CLINIC_OR_DEPARTMENT_OTHER): Payer: BC Managed Care – PPO

## 2013-12-20 VITALS — BP 152/70 | HR 83 | Temp 98.3°F | Resp 18 | Ht <= 58 in | Wt 156.4 lb

## 2013-12-20 DIAGNOSIS — D72819 Decreased white blood cell count, unspecified: Secondary | ICD-10-CM

## 2013-12-20 DIAGNOSIS — K458 Other specified abdominal hernia without obstruction or gangrene: Secondary | ICD-10-CM

## 2013-12-20 DIAGNOSIS — M549 Dorsalgia, unspecified: Secondary | ICD-10-CM

## 2013-12-20 DIAGNOSIS — D693 Immune thrombocytopenic purpura: Secondary | ICD-10-CM

## 2013-12-20 DIAGNOSIS — I10 Essential (primary) hypertension: Secondary | ICD-10-CM

## 2013-12-20 LAB — CBC WITH DIFFERENTIAL/PLATELET
BASO%: 0.2 % (ref 0.0–2.0)
Basophils Absolute: 0 10*3/uL (ref 0.0–0.1)
EOS%: 4 % (ref 0.0–7.0)
Eosinophils Absolute: 0.5 10*3/uL (ref 0.0–0.5)
HCT: 42.4 % (ref 34.8–46.6)
HGB: 14.2 g/dL (ref 11.6–15.9)
LYMPH%: 13.5 % — ABNORMAL LOW (ref 14.0–49.7)
MCH: 29.8 pg (ref 25.1–34.0)
MCHC: 33.5 g/dL (ref 31.5–36.0)
MCV: 89.1 fL (ref 79.5–101.0)
MONO#: 0.7 10*3/uL (ref 0.1–0.9)
MONO%: 5.4 % (ref 0.0–14.0)
NEUT#: 9.7 10*3/uL — ABNORMAL HIGH (ref 1.5–6.5)
NEUT%: 76.9 % — ABNORMAL HIGH (ref 38.4–76.8)
Platelets: 82 10*3/uL — ABNORMAL LOW (ref 145–400)
RBC: 4.76 10*6/uL (ref 3.70–5.45)
RDW: 13.3 % (ref 11.2–14.5)
WBC: 12.7 10*3/uL — ABNORMAL HIGH (ref 3.9–10.3)
lymph#: 1.7 10*3/uL (ref 0.9–3.3)

## 2013-12-20 MED ORDER — MORPHINE SULFATE 15 MG PO TABS: 15.0000 mg | ORAL_TABLET | Freq: Four times a day (QID) | ORAL | Status: DC | PRN

## 2013-12-20 NOTE — Telephone Encounter (Signed)
gv pt appt schedule for may.  °

## 2013-12-20 NOTE — Progress Notes (Signed)
[place] OFFICE PROGRESS NOTE  [name], [name], NP DIAGNOSIS:  Steroid refractory ITP  SUMMARY OF HEMATOLOGIC HISTORY: This is a pleasant 63 year old lady with progressive thrombocytopenia since 2009. She also have her mild leukocytosis and erythrocytosis, likely secondary to smoking. On 08/29/2013, I started her on prednisone 60 mg daily for ITP On 09/14/2013, due to lack of response, I increase the prednisone to 80 mg once a day and add L93 and folic acid supplement On 09/23/2013, she was noted to be refractory to prednisone. I suspect initiating prednisone taper to 40 mg a day On 09/29/2013, prednisone dose was further reduced to 20 mg daily and then 10 mg daily. On 09/30/2013, she was started on rituximab weekly, completed treatment by February 6th, 2015. On 10/14/2013, I continue steroid taper to 5 mg daily and the patient completed steroid taper by mid-February 2015. INTERVAL HISTORY: [name] 63 y.o. female returns for further followup. She still have persistent back pain. She ran out of her pain medicine and have refill of oxycodone by her primary care provider. She denies any bruising or bleeding.  I have reviewed the past medical history, past surgical history, social history and family history with the patient and they are unchanged from previous note.  ALLERGIES:  has No Known Allergies.  MEDICATIONS:  Current Outpatient Prescriptions  Medication Sig Dispense Refill  . acetaminophen (TYLENOL) 500 MG tablet Take 1,000 mg by mouth every 6 (six) hours as needed for moderate pain.      . baclofen (LIORESAL) 10 MG tablet Take 1 tablet (10 mg total) by mouth 3 (three) times daily.  60 each  0  . folic acid (FOLVITE) 1 MG tablet Take 1 tablet (1 mg total) by mouth daily.  30 tablet  1  . metoprolol succinate (TOPROL-XL) 50 MG 24 hr tablet       . morphine (MSIR) 15 MG tablet Take 1 tablet (15 mg total) by mouth every 6 (six) hours as needed for  severe pain.  90 tablet  0  . oxyCODONE (OXY IR/ROXICODONE) 5 MG immediate release tablet Take 1 tablet (5 mg total) by mouth every 6 (six) hours as needed for severe pain.  90 tablet  0  . vitamin B-12 (CYANOCOBALAMIN) 1000 MCG tablet Take 1 tablet (1,000 mcg total) by mouth daily.  30 tablet  1  . Vitamin D, Ergocalciferol, (DRISDOL) 50000 UNITS CAPS capsule Take 50,000 Units by mouth every 7 (seven) days.       No current facility-administered medications for this visit.     REVIEW OF SYSTEMS:   Constitutional: Denies fevers, chills or night sweats Neurological:Denies numbness, tingling or new weaknesses Behavioral/Psych: Mood is stable, no new changes  All other systems were reviewed with the patient and are negative.  PHYSICAL EXAMINATION: ECOG PERFORMANCE STATUS: 1 - Symptomatic but completely ambulatory  Filed Vitals:   12/20/13 1424  BP: 152/70  Pulse: 83  Temp: 98.3 F (36.8 C)  Resp: 18   Filed Weights   12/20/13 1424  Weight: 156 lb 6.4 oz (70.943 kg)    GENERAL:alert, no distress and comfortable SKIN: skin color, texture, turgor are normal, no rashes or significant lesions NEURO: alert & oriented x 3 with fluent speech, no focal motor/sensory deficits  LABORATORY DATA:  I have reviewed the data as listed Results for orders placed in visit on 12/20/13 (from the past 48 hour(s))  CBC WITH DIFFERENTIAL     Status: Abnormal   Collection Time  12/20/13  1:59 PM      Result Value Ref Range   WBC 12.7 (*) 3.9 - 10.3 10e3/uL   NEUT# 9.7 (*) 1.5 - 6.5 10e3/uL   HGB 14.2  11.6 - 15.9 g/dL   HCT 42.4  34.8 - 46.6 %   Platelets 82 (*) 145 - 400 10e3/uL   MCV 89.1  79.5 - 101.0 fL   MCH 29.8  25.1 - 34.0 pg   MCHC 33.5  31.5 - 36.0 g/dL   RBC 4.76  3.70 - 5.45 10e6/uL   RDW 13.3  11.2 - 14.5 %   lymph# 1.7  0.9 - 3.3 10e3/uL   MONO# 0.7  0.1 - 0.9 10e3/uL   Eosinophils Absolute 0.5  0.0 - 0.5 10e3/uL   Basophils Absolute 0.0  0.0 - 0.1 10e3/uL   NEUT% 76.9 (*)  38.4 - 76.8 %   LYMPH% 13.5 (*) 14.0 - 49.7 %   MONO% 5.4  0.0 - 14.0 %   EOS% 4.0  0.0 - 7.0 %   BASO% 0.2  0.0 - 2.0 %    Lab Results  Component Value Date   WBC 12.7* 12/20/2013   HGB 14.2 12/20/2013   HCT 42.4 12/20/2013   MCV 89.1 12/20/2013   PLT 82* 12/20/2013   ASSESSMENT & PLAN:  #1 steroid refractory ITP The patient has no response to prednisone. CT scan show no evidence to suggest lymphoma. She was started on weekly rituximab since January 16th, 2015. I informed the patient response rates typically does not begin until 4-6 weeks of the rituximab infusion. I am encouraged to see and upward trend of improvement of platelet count over the past 1 month. I plan to see her back in 4 weeks' time to assess her platelet count. If her platelet count failed to improve, we also discussed about other form of treatment such as Promacta and N plate. #2 leukocytosis This is due to smoking. She had no signs and symptoms to suggest infection #3 hypertension This has improved since we stop prednisone  #4 tobacco abuse Continue to encourage the patient to quit smoking. She appears to be motivated. #5 recent back pain with fracture Continue conservative management. The patient is not a candidate for surgical intervention until her thrombocytopenia is corrected. I filled her prescription of pain medicine #6 abdominal hernia She is not symptomatic. We will observe for now. All questions were answered. The patient knows to call the clinic with any problems, questions or concerns. No barriers to learning was detected.  I spent 25 minutes counseling the patient face to face. The total time spent in the appointment was 30 minutes and more than 50% was on counseling.     [facility], [place], MD 12/20/2013 3:08 PM

## 2014-01-10 ENCOUNTER — Telehealth: Payer: Self-pay | Admitting: *Deleted

## 2014-01-10 NOTE — Telephone Encounter (Signed)
OK to refill only once more 60 tabs and tell her that will be the last refill She will need to get this from pcp in the future

## 2014-01-10 NOTE — Telephone Encounter (Signed)
Pt requests refill on her "muscle relaxer"  Baclofen.

## 2014-01-11 ENCOUNTER — Other Ambulatory Visit: Payer: Self-pay | Admitting: *Deleted

## 2014-01-11 DIAGNOSIS — R252 Cramp and spasm: Secondary | ICD-10-CM

## 2014-01-11 MED ORDER — BACLOFEN 10 MG PO TABS: 10.0000 mg | ORAL_TABLET | Freq: Three times a day (TID) | ORAL | Status: DC

## 2014-01-11 NOTE — Telephone Encounter (Signed)
Informed pt of refill Baclofen sent to pharmacy.  Instructed per Dr. Alvy Bimler to make arrangements for future rx from PCP.  Instructed for pt to have PCP manage back pain.  She verbalized understanding.

## 2014-01-17 ENCOUNTER — Encounter: Payer: Self-pay | Admitting: Hematology and Oncology

## 2014-01-17 ENCOUNTER — Telehealth: Payer: Self-pay | Admitting: Hematology and Oncology

## 2014-01-17 ENCOUNTER — Other Ambulatory Visit (HOSPITAL_BASED_OUTPATIENT_CLINIC_OR_DEPARTMENT_OTHER): Payer: BC Managed Care – PPO

## 2014-01-17 ENCOUNTER — Ambulatory Visit (HOSPITAL_BASED_OUTPATIENT_CLINIC_OR_DEPARTMENT_OTHER): Payer: BC Managed Care – PPO | Admitting: Hematology and Oncology

## 2014-01-17 ENCOUNTER — Telehealth: Payer: Self-pay | Admitting: *Deleted

## 2014-01-17 VITALS — BP 151/60 | HR 71 | Temp 98.6°F | Resp 18 | Ht <= 58 in | Wt 158.8 lb

## 2014-01-17 DIAGNOSIS — D693 Immune thrombocytopenic purpura: Secondary | ICD-10-CM

## 2014-01-17 DIAGNOSIS — D72829 Elevated white blood cell count, unspecified: Secondary | ICD-10-CM

## 2014-01-17 DIAGNOSIS — M549 Dorsalgia, unspecified: Secondary | ICD-10-CM

## 2014-01-17 DIAGNOSIS — I10 Essential (primary) hypertension: Secondary | ICD-10-CM

## 2014-01-17 LAB — CBC WITH DIFFERENTIAL/PLATELET
BASO%: 0.7 % (ref 0.0–2.0)
Basophils Absolute: 0.1 10*3/uL (ref 0.0–0.1)
EOS%: 5.4 % (ref 0.0–7.0)
Eosinophils Absolute: 0.5 10*3/uL (ref 0.0–0.5)
HCT: 41.6 % (ref 34.8–46.6)
HGB: 13.8 g/dL (ref 11.6–15.9)
LYMPH%: 20.3 % (ref 14.0–49.7)
MCH: 28.9 pg (ref 25.1–34.0)
MCHC: 33.3 g/dL (ref 31.5–36.0)
MCV: 86.8 fL (ref 79.5–101.0)
MONO#: 0.5 10*3/uL (ref 0.1–0.9)
MONO%: 6 % (ref 0.0–14.0)
NEUT#: 6 10*3/uL (ref 1.5–6.5)
NEUT%: 67.6 % (ref 38.4–76.8)
Platelets: 90 10*3/uL — ABNORMAL LOW (ref 145–400)
RBC: 4.8 10*6/uL (ref 3.70–5.45)
RDW: 13.4 % (ref 11.2–14.5)
WBC: 8.8 10*3/uL (ref 3.9–10.3)
lymph#: 1.8 10*3/uL (ref 0.9–3.3)

## 2014-01-17 MED ORDER — MORPHINE SULFATE 15 MG PO TABS: 15.0000 mg | ORAL_TABLET | Freq: Four times a day (QID) | ORAL | Status: DC | PRN

## 2014-01-17 NOTE — Telephone Encounter (Signed)
Called Dr. Clarice Pole office for appt to see pt back this month to plan surgery.  Pt's platelets are slowly improving.  Appt made for 5/21 at 1:30 pm.   Office note faxed to Dr. Ellene Route at fax [phone].  Attempted to contact pt to inform of appt d/t for Dr. Ellene Route and there is no answer and no VM.  Will f/u tomorrow.

## 2014-01-17 NOTE — Telephone Encounter (Signed)
per pof to sch pt appt-printed pt appt sch

## 2014-01-17 NOTE — Progress Notes (Signed)
[place] OFFICE PROGRESS NOTE  [name], [name], NP DIAGNOSIS:  Steroid refractory ITP  SUMMARY OF HEMATOLOGIC HISTORY: This is a pleasant 63 year old lady with progressive thrombocytopenia since 2009. She also have her mild leukocytosis and erythrocytosis, likely secondary to smoking. On 08/29/2013, I started her on prednisone 60 mg daily for ITP On 09/14/2013, due to lack of response, I increase the prednisone to 80 mg once a day and add L87 and folic acid supplement On 09/23/2013, she was noted to be refractory to prednisone. I suspect initiating prednisone taper to 40 mg a day On 09/29/2013, prednisone dose was further reduced to 20 mg daily and then 10 mg daily. On 09/30/2013, she was started on rituximab weekly, completed treatment by February 6th, 2015. On 10/14/2013, I continue steroid taper to 5 mg daily and the patient completed steroid taper by mid-February 2015. INTERVAL HISTORY: [name] 63 y.o. female returns for further followup. She continue to have persistent back pain. The patient denies any recent signs or symptoms of bleeding such as spontaneous epistaxis, hematuria or hematochezia. The patient continues to smoke and unable to quit. I have reviewed the past medical history, past surgical history, social history and family history with the patient and they are unchanged from previous note.  ALLERGIES:  has No Known Allergies.  MEDICATIONS:  Current Outpatient Prescriptions  Medication Sig Dispense Refill  . acetaminophen (TYLENOL) 500 MG tablet Take 1,000 mg by mouth every 6 (six) hours as needed for moderate pain.      . baclofen (LIORESAL) 10 MG tablet Take 1 tablet (10 mg total) by mouth 3 (three) times daily.  60 each  0  . folic acid (FOLVITE) 1 MG tablet Take 1 tablet (1 mg total) by mouth daily.  30 tablet  1  . metoprolol succinate (TOPROL-XL) 50 MG 24 hr tablet       . morphine (MSIR) 15 MG tablet Take 1 tablet (15 mg total) by  mouth every 6 (six) hours as needed for severe pain.  90 tablet  0  . vitamin B-12 (CYANOCOBALAMIN) 1000 MCG tablet Take 1 tablet (1,000 mcg total) by mouth daily.  30 tablet  1  . Vitamin D, Ergocalciferol, (DRISDOL) 50000 UNITS CAPS capsule Take 50,000 Units by mouth every 7 (seven) days.       No current facility-administered medications for this visit.     REVIEW OF SYSTEMS:   Constitutional: Denies fevers, chills or night sweats Eyes: Denies blurriness of vision Ears, nose, mouth, throat, and face: Denies mucositis or sore throat Respiratory: Denies cough, dyspnea or wheezes Cardiovascular: Denies palpitation, chest discomfort or lower extremity swelling Gastrointestinal:  Denies nausea, heartburn or change in bowel habits Skin: Denies abnormal skin rashes Lymphatics: Denies new lymphadenopathy or easy bruising Neurological:Denies numbness, tingling or new weaknesses Behavioral/Psych: Mood is stable, no new changes  All other systems were reviewed with the patient and are negative.  PHYSICAL EXAMINATION: ECOG PERFORMANCE STATUS: 0 - Asymptomatic  Filed Vitals:   01/17/14 1431  BP: 151/60  Pulse: 71  Temp: 98.6 F (37 C)  Resp: 18   Filed Weights   01/17/14 1431  Weight: 158 lb 12.8 oz (72.031 kg)    GENERAL:alert, no distress and comfortable. Examination is limited due to presence of a brace SKIN: skin color, texture, turgor are normal, no rashes or significant lesions EYES: normal, Conjunctiva are pink and non-injected, sclera clear Musculoskeletal:no cyanosis of digits and no clubbing  NEURO: alert & oriented x  3 with fluent speech, no focal motor/sensory deficits  LABORATORY DATA:  I have reviewed the data as listed Results for orders placed in visit on 01/17/14 (from the past 48 hour(s))  CBC WITH DIFFERENTIAL     Status: Abnormal   Collection Time    01/17/14  2:13 PM      Result Value Ref Range   WBC 8.8  3.9 - 10.3 10e3/uL   NEUT# 6.0  1.5 - 6.5 10e3/uL    HGB 13.8  11.6 - 15.9 g/dL   HCT 41.6  34.8 - 46.6 %   Platelets 90 (*) 145 - 400 10e3/uL   MCV 86.8  79.5 - 101.0 fL   MCH 28.9  25.1 - 34.0 pg   MCHC 33.3  31.5 - 36.0 g/dL   RBC 4.80  3.70 - 5.45 10e6/uL   RDW 13.4  11.2 - 14.5 %   lymph# 1.8  0.9 - 3.3 10e3/uL   MONO# 0.5  0.1 - 0.9 10e3/uL   Eosinophils Absolute 0.5  0.0 - 0.5 10e3/uL   Basophils Absolute 0.1  0.0 - 0.1 10e3/uL   NEUT% 67.6  38.4 - 76.8 %   LYMPH% 20.3  14.0 - 49.7 %   MONO% 6.0  0.0 - 14.0 %   EOS% 5.4  0.0 - 7.0 %   BASO% 0.7  0.0 - 2.0 %    Lab Results  Component Value Date   WBC 8.8 01/17/2014   HGB 13.8 01/17/2014   HCT 41.6 01/17/2014   MCV 86.8 01/17/2014   PLT 90* 01/17/2014   ASSESSMENT & PLAN:  #1 steroid refractory ITP The patient has no response to prednisone. CT scan show no evidence to suggest lymphoma. She was started on weekly rituximab since January 16th, 2015. I informed the patient response rates typically does not begin until 4-6 weeks of the rituximab infusion. I am encouraged to see and upward trend of improvement of platelet count over the past 1 month. I plan to see her back in 4 weeks' time to assess her platelet count. If her platelet count failed to improve, we also discussed about other form of treatment such as Promacta and N plate. Her platelet count is close to 100,000. I recommend she contact her orthopedic surgeon to schedule her back surgery within the next month or so. #2 leukocytosis, resolved This is due to smoking. She had no signs and symptoms to suggest infection #3 hypertension This has improved since we stop prednisone  #4 tobacco abuse Continue to encourage the patient to quit smoking. She appears to be motivated. #5 recent back pain with fracture Continue conservative management. The patient is not a candidate for surgical intervention until her thrombocytopenia is greater than 100,000.. I filled her prescription of pain medicine #6 abdominal hernia She is not  symptomatic. We will observe for now. All questions were answered. The patient knows to call the clinic with any problems, questions or concerns. No barriers to learning was detected. I spent 25 minutes counseling the patient face to face. The total time spent in the appointment was 30 minutes and more than 50% was on counseling.     Heath Lark, MD 01/17/2014 3:19 PM

## 2014-01-18 ENCOUNTER — Telehealth: Payer: Self-pay | Admitting: *Deleted

## 2014-01-18 NOTE — Telephone Encounter (Signed)
Pt notified of appointment with Dr Ellene Route on 02/02/14 @ 1:30

## 2014-02-10 ENCOUNTER — Other Ambulatory Visit: Payer: Self-pay | Admitting: Hematology and Oncology

## 2014-02-10 DIAGNOSIS — M549 Dorsalgia, unspecified: Secondary | ICD-10-CM

## 2014-02-10 MED ORDER — MORPHINE SULFATE 15 MG PO TABS: 15.0000 mg | ORAL_TABLET | Freq: Four times a day (QID) | ORAL | Status: DC | PRN

## 2014-02-20 ENCOUNTER — Telehealth: Payer: Self-pay | Admitting: Hematology and Oncology

## 2014-02-20 ENCOUNTER — Encounter: Payer: Self-pay | Admitting: Hematology and Oncology

## 2014-02-20 ENCOUNTER — Other Ambulatory Visit (HOSPITAL_BASED_OUTPATIENT_CLINIC_OR_DEPARTMENT_OTHER): Payer: BC Managed Care – PPO

## 2014-02-20 ENCOUNTER — Ambulatory Visit (HOSPITAL_BASED_OUTPATIENT_CLINIC_OR_DEPARTMENT_OTHER): Payer: BC Managed Care – PPO | Admitting: Hematology and Oncology

## 2014-02-20 VITALS — BP 160/76 | HR 86 | Temp 98.5°F | Resp 20 | Ht <= 58 in | Wt 156.2 lb

## 2014-02-20 DIAGNOSIS — D693 Immune thrombocytopenic purpura: Secondary | ICD-10-CM

## 2014-02-20 DIAGNOSIS — M549 Dorsalgia, unspecified: Secondary | ICD-10-CM

## 2014-02-20 LAB — CBC WITH DIFFERENTIAL/PLATELET
BASO%: 0.5 % (ref 0.0–2.0)
Basophils Absolute: 0.1 10*3/uL (ref 0.0–0.1)
EOS%: 4.6 % (ref 0.0–7.0)
Eosinophils Absolute: 0.6 10*3/uL — ABNORMAL HIGH (ref 0.0–0.5)
HCT: 43.4 % (ref 34.8–46.6)
HGB: 14.2 g/dL (ref 11.6–15.9)
LYMPH%: 11.7 % — ABNORMAL LOW (ref 14.0–49.7)
MCH: 27.8 pg (ref 25.1–34.0)
MCHC: 32.7 g/dL (ref 31.5–36.0)
MCV: 85.3 fL (ref 79.5–101.0)
MONO#: 0.6 10*3/uL (ref 0.1–0.9)
MONO%: 5.2 % (ref 0.0–14.0)
NEUT#: 9.6 10*3/uL — ABNORMAL HIGH (ref 1.5–6.5)
NEUT%: 78 % — ABNORMAL HIGH (ref 38.4–76.8)
Platelets: 80 10*3/uL — ABNORMAL LOW (ref 145–400)
RBC: 5.1 10*6/uL (ref 3.70–5.45)
RDW: 13.9 % (ref 11.2–14.5)
WBC: 12.3 10*3/uL — ABNORMAL HIGH (ref 3.9–10.3)
lymph#: 1.4 10*3/uL (ref 0.9–3.3)

## 2014-02-20 NOTE — Assessment & Plan Note (Signed)
This is to to compression fracture. She had planned surgery in the near future. I have given her prescription morphine for pain recently and will see her back in a month for future refill.

## 2014-02-20 NOTE — Telephone Encounter (Signed)
gave pt appt for lab and and MD

## 2014-02-20 NOTE — Assessment & Plan Note (Signed)
The patient is refractory to steroids and rituximab at this point. I recommend consideration for Nplate or platelet transfusion before and after the surgery. She will see her surgeon this week. I recommend she passed a message to a surgeon to call me directly. I plan to see her back end of the month, we'll repeat blood work again.

## 2014-02-20 NOTE — Progress Notes (Signed)
[facility] OFFICE PROGRESS NOTE  [name], [name], NP  DIAGNOSIS:  Steroid refractory ITP  SUMMARY OF HEMATOLOGIC HISTORY: This is a pleasant 63 year old lady with progressive thrombocytopenia since 2009. She also have her mild leukocytosis and erythrocytosis, likely secondary to smoking. On 08/29/2013, I started her on prednisone 60 mg daily for ITP On 09/14/2013, due to lack of response, I increase the prednisone to 80 mg once a day and add N82 and folic acid supplement On 09/23/2013, she was noted to be refractory to prednisone. I suspect initiating prednisone taper to 40 mg a day On 09/29/2013, prednisone dose was further reduced to 20 mg daily and then 10 mg daily. On 09/30/2013, she was started on rituximab weekly, completed treatment by February 6th, 2015. On 10/14/2013, I continue steroid taper to 5 mg daily and the patient completed steroid taper by mid-February 2015. INTERVAL HISTORY: [name] 63 y.o. female returns for further followup. She still had persistent back pain, dependent on morphine sulfate 3 times a day as needed. She has met with her surgeon for possible planned surgery in the future. The patient denies any recent signs or symptoms of bleeding such as spontaneous epistaxis, hematuria or hematochezia.  I have reviewed the past medical history, past surgical history, social history and family history with the patient and they are unchanged from previous note.  ALLERGIES:  has No Known Allergies.  MEDICATIONS:  Current Outpatient Prescriptions  Medication Sig Dispense Refill  . acetaminophen (TYLENOL) 500 MG tablet Take 1,000 mg by mouth every 6 (six) hours as needed for moderate pain.      . metoprolol succinate (TOPROL-XL) 50 MG 24 hr tablet       . morphine (MSIR) 15 MG tablet Take 1 tablet (15 mg total) by mouth every 6 (six) hours as needed for severe pain.  90 tablet  0  . Vitamin D, Ergocalciferol, (DRISDOL) 50000 UNITS CAPS  capsule Take 50,000 Units by mouth every 7 (seven) days.      . baclofen (LIORESAL) 10 MG tablet Take 1 tablet (10 mg total) by mouth 3 (three) times daily.  60 each  0  . folic acid (FOLVITE) 1 MG tablet Take 1 tablet (1 mg total) by mouth daily.  30 tablet  1  . vitamin B-12 (CYANOCOBALAMIN) 1000 MCG tablet Take 1 tablet (1,000 mcg total) by mouth daily.  30 tablet  1   No current facility-administered medications for this visit.     REVIEW OF SYSTEMS:   Constitutional: Denies fevers, chills or night sweats Eyes: Denies blurriness of vision Ears, nose, mouth, throat, and face: Denies mucositis or sore throat Respiratory: Denies cough, dyspnea or wheezes Cardiovascular: Denies palpitation, chest discomfort or lower extremity swelling Gastrointestinal:  Denies nausea, heartburn or change in bowel habits Skin: Denies abnormal skin rashes Lymphatics: Denies new lymphadenopathy or easy bruising Neurological:Denies numbness, tingling or new weaknesses Behavioral/Psych: Mood is stable, no new changes  All other systems were reviewed with the patient and are negative.  PHYSICAL EXAMINATION: ECOG PERFORMANCE STATUS: 0 - Asymptomatic  Filed Vitals:   02/20/14 1257  BP: 160/76  Pulse: 86  Temp: 98.5 F (36.9 C)  Resp: 20   Filed Weights   02/20/14 1257  Weight: 156 lb 3.2 oz (70.852 kg)    GENERAL:alert, no distress and comfortable SKIN: skin color, texture, turgor are normal, no rashes or significant lesions EYES: normal, Conjunctiva are pink and non-injected, sclera clear  Musculoskeletal:no cyanosis of digits and no  clubbing  NEURO: alert & oriented x 3 with fluent speech, no focal motor/sensory deficits  LABORATORY DATA:  I have reviewed the data as listed Results for orders placed in visit on 02/20/14 (from the past 48 hour(s))  CBC WITH DIFFERENTIAL     Status: Abnormal   Collection Time    02/20/14 12:46 PM      Result Value Ref Range   WBC 12.3 (*) 3.9 - 10.3 10e3/uL    NEUT# 9.6 (*) 1.5 - 6.5 10e3/uL   HGB 14.2  11.6 - 15.9 g/dL   HCT 43.4  34.8 - 46.6 %   Platelets 80 (*) 145 - 400 10e3/uL   MCV 85.3  79.5 - 101.0 fL   MCH 27.8  25.1 - 34.0 pg   MCHC 32.7  31.5 - 36.0 g/dL   RBC 5.10  3.70 - 5.45 10e6/uL   RDW 13.9  11.2 - 14.5 %   lymph# 1.4  0.9 - 3.3 10e3/uL   MONO# 0.6  0.1 - 0.9 10e3/uL   Eosinophils Absolute 0.6 (*) 0.0 - 0.5 10e3/uL   Basophils Absolute 0.1  0.0 - 0.1 10e3/uL   NEUT% 78.0 (*) 38.4 - 76.8 %   LYMPH% 11.7 (*) 14.0 - 49.7 %   MONO% 5.2  0.0 - 14.0 %   EOS% 4.6  0.0 - 7.0 %   BASO% 0.5  0.0 - 2.0 %    Lab Results  Component Value Date   WBC 12.3* 02/20/2014   HGB 14.2 02/20/2014   HCT 43.4 02/20/2014   MCV 85.3 02/20/2014   PLT 80* 02/20/2014    ASSESSMENT & PLAN:  ITP (idiopathic thrombocytopenic purpura) The patient is refractory to steroids and rituximab at this point. I recommend consideration for Nplate or platelet transfusion before and after the surgery. She will see her surgeon this week. I recommend she passed a message to a surgeon to call me directly. I plan to see her back end of the month, we'll repeat blood work again.  Back pain This is to to compression fracture. She had planned surgery in the near future. I have given her prescription morphine for pain recently and will see her back in a month for future refill.   All questions were answered. The patient knows to call the clinic with any problems, questions or concerns. No barriers to learning was detected.  I spent 15 minutes counseling the patient face to face. The total time spent in the appointment was 20 minutes and more than 50% was on counseling.     Heath Lark, MD 02/20/2014 1:18 PM

## 2014-02-20 NOTE — Patient Instructions (Signed)

## 2014-02-23 ENCOUNTER — Other Ambulatory Visit: Payer: Self-pay | Admitting: Neurological Surgery

## 2014-03-10 ENCOUNTER — Encounter: Payer: Self-pay | Admitting: Hematology and Oncology

## 2014-03-10 ENCOUNTER — Other Ambulatory Visit (HOSPITAL_BASED_OUTPATIENT_CLINIC_OR_DEPARTMENT_OTHER): Payer: BC Managed Care – PPO

## 2014-03-10 ENCOUNTER — Telehealth: Payer: Self-pay | Admitting: Hematology and Oncology

## 2014-03-10 ENCOUNTER — Ambulatory Visit (HOSPITAL_BASED_OUTPATIENT_CLINIC_OR_DEPARTMENT_OTHER): Payer: BC Managed Care – PPO | Admitting: Hematology and Oncology

## 2014-03-10 ENCOUNTER — Other Ambulatory Visit: Payer: Self-pay | Admitting: *Deleted

## 2014-03-10 VITALS — BP 146/72 | HR 79 | Temp 97.9°F | Resp 19 | Ht <= 58 in | Wt 155.4 lb

## 2014-03-10 DIAGNOSIS — M546 Pain in thoracic spine: Secondary | ICD-10-CM

## 2014-03-10 DIAGNOSIS — D693 Immune thrombocytopenic purpura: Secondary | ICD-10-CM

## 2014-03-10 DIAGNOSIS — Z87891 Personal history of nicotine dependence: Secondary | ICD-10-CM | POA: Insufficient documentation

## 2014-03-10 DIAGNOSIS — M549 Dorsalgia, unspecified: Secondary | ICD-10-CM

## 2014-03-10 DIAGNOSIS — F17200 Nicotine dependence, unspecified, uncomplicated: Secondary | ICD-10-CM

## 2014-03-10 DIAGNOSIS — Z720 Tobacco use: Secondary | ICD-10-CM

## 2014-03-10 LAB — CBC WITH DIFFERENTIAL/PLATELET
BASO%: 0.6 % (ref 0.0–2.0)
Basophils Absolute: 0.1 10*3/uL (ref 0.0–0.1)
EOS%: 4.4 % (ref 0.0–7.0)
Eosinophils Absolute: 0.5 10*3/uL (ref 0.0–0.5)
HCT: 41.4 % (ref 34.8–46.6)
HGB: 13.7 g/dL (ref 11.6–15.9)
LYMPH%: 15.9 % (ref 14.0–49.7)
MCH: 28.2 pg (ref 25.1–34.0)
MCHC: 33.1 g/dL (ref 31.5–36.0)
MCV: 85.4 fL (ref 79.5–101.0)
MONO#: 0.7 10*3/uL (ref 0.1–0.9)
MONO%: 5.8 % (ref 0.0–14.0)
NEUT#: 8.5 10*3/uL — ABNORMAL HIGH (ref 1.5–6.5)
NEUT%: 73.3 % (ref 38.4–76.8)
Platelets: 65 10*3/uL — ABNORMAL LOW (ref 145–400)
RBC: 4.85 10*6/uL (ref 3.70–5.45)
RDW: 14.2 % (ref 11.2–14.5)
WBC: 11.6 10*3/uL — ABNORMAL HIGH (ref 3.9–10.3)
lymph#: 1.9 10*3/uL (ref 0.9–3.3)

## 2014-03-10 MED ORDER — MORPHINE SULFATE 15 MG PO TABS: 15.0000 mg | ORAL_TABLET | Freq: Four times a day (QID) | ORAL | Status: DC | PRN

## 2014-03-10 NOTE — Progress Notes (Signed)
[place] OFFICE PROGRESS NOTE  [name], Evelyn School, [name] SUMMARY OF HEMATOLOGIC HISTORY: This is a pleasant 63 year old lady with progressive thrombocytopenia since 2009. She also have her mild leukocytosis and erythrocytosis, likely secondary to smoking. She had back fracture and was unable to proceed with surgery due to thrombocytopenia and was subsequently found to have ITP. On 08/29/2013, I started her on prednisone 60 mg daily for ITP On 09/14/2013, due to lack of response, I increase the prednisone to 80 mg once a day and add G29 and folic acid supplement On 09/23/2013, she was noted to be refractory to prednisone. I suspect initiating prednisone taper to 40 mg a day On 09/29/2013, prednisone dose was further reduced to 20 mg daily and then 10 mg daily. On 09/30/2013, she was started on rituximab weekly, completed treatment by February 6th, 2015. On 10/14/2013, I continue steroid taper to 5 mg daily and the patient completed steroid taper by mid-February 2015. On 03/09/2014, platelet count starting to decline and decision was made to start her on Nplate. INTERVAL HISTORY: [name] 63 y.o. female returns for followup. Her back pain is about the same and she is dependent on morphine sulfate 3 times a day as needed. I spoke with her surgeon who plan for surgery on 03/28/2014. She received a letter from [employer] company denying the possibility of her surgery due to unresolved platelet issues.  I have reviewed the past medical history, past surgical history, social history and family history with the patient and they are unchanged from previous note.  ALLERGIES:  has No Known Allergies.  MEDICATIONS:  Current Outpatient Prescriptions  Medication Sig Dispense Refill  . acetaminophen (TYLENOL) 500 MG tablet Take 1,000 mg by mouth every 6 (six) hours as needed for moderate pain.      . folic acid (FOLVITE) 1 MG tablet Take 1 tablet (1 mg total) by mouth daily.   30 tablet  1  . metoprolol succinate (TOPROL-XL) 50 MG 24 hr tablet       . vitamin B-12 (CYANOCOBALAMIN) 1000 MCG tablet Take 1 tablet (1,000 mcg total) by mouth daily.  30 tablet  1  . Vitamin D, Ergocalciferol, (DRISDOL) 50000 UNITS CAPS capsule Take 50,000 Units by mouth every 7 (seven) days.      . baclofen (LIORESAL) 10 MG tablet Take 1 tablet (10 mg total) by mouth 3 (three) times daily.  60 each  0  . morphine (MSIR) 15 MG tablet Take 1 tablet (15 mg total) by mouth every 6 (six) hours as needed for severe pain.  90 tablet  0   No current facility-administered medications for this visit.     REVIEW OF SYSTEMS:   Constitutional: Denies fevers, chills or night sweats Eyes: Denies blurriness of vision Ears, nose, mouth, throat, and face: Denies mucositis or sore throat Respiratory: Denies cough, dyspnea or wheezes Cardiovascular: Denies palpitation, chest discomfort or lower extremity swelling Gastrointestinal:  Denies nausea, heartburn or change in bowel habits Skin: Denies abnormal skin rashes Lymphatics: Denies new lymphadenopathy or easy bruising Neurological:Denies numbness, tingling or new weaknesses Behavioral/Psych: Mood is stable, no new changes  All other systems were reviewed with the patient and are negative.  PHYSICAL EXAMINATION: ECOG PERFORMANCE STATUS: 1 - Symptomatic but completely ambulatory  Filed Vitals:   03/10/14 1210  BP: 146/72  Pulse: 79  Temp: 97.9 F (36.6 C)  Resp: 19   Filed Weights   03/10/14 1210  Weight: 155 lb 6.4 oz (70.489 kg)  GENERAL:alert, no distress and comfortable SKIN: skin color, texture, turgor are normal, no rashes or significant lesions EYES: normal, Conjunctiva are pink and non-injected, sclera clear OROPHARYNX:no exudate, no erythema and lips, buccal mucosa, and tongue normal  NECK: supple, thyroid normal size, non-tender, without nodularity LYMPH:  no palpable lymphadenopathy in the cervical, axillary or  inguinal LUNGS: clear to auscultation and percussion with normal breathing effort HEART: regular rate & rhythm and no murmurs and no lower extremity edema ABDOMEN:abdomen soft, non-tender and normal bowel sounds Musculoskeletal:no cyanosis of digits and no clubbing  NEURO: alert & oriented x 3 with fluent speech, no focal motor/sensory deficits  LABORATORY DATA:  I have reviewed the data as listed Results for orders placed in visit on 03/10/14 (from the past 48 hour(s))  CBC WITH DIFFERENTIAL     Status: Abnormal   Collection Time    03/10/14 12:00 PM      Result Value Ref Range   WBC 11.6 (*) 3.9 - 10.3 10e3/uL   NEUT# 8.5 (*) 1.5 - 6.5 10e3/uL   HGB 13.7  11.6 - 15.9 g/dL   HCT 41.4  34.8 - 46.6 %   Platelets 65 (*) 145 - 400 10e3/uL   MCV 85.4  79.5 - 101.0 fL   MCH 28.2  25.1 - 34.0 pg   MCHC 33.1  31.5 - 36.0 g/dL   RBC 4.85  3.70 - 5.45 10e6/uL   RDW 14.2  11.2 - 14.5 %   lymph# 1.9  0.9 - 3.3 10e3/uL   MONO# 0.7  0.1 - 0.9 10e3/uL   Eosinophils Absolute 0.5  0.0 - 0.5 10e3/uL   Basophils Absolute 0.1  0.0 - 0.1 10e3/uL   NEUT% 73.3  38.4 - 76.8 %   LYMPH% 15.9  14.0 - 49.7 %   MONO% 5.8  0.0 - 14.0 %   EOS% 4.4  0.0 - 7.0 %   BASO% 0.6  0.0 - 2.0 %    Lab Results  Component Value Date   WBC 11.6* 03/10/2014   HGB 13.7 03/10/2014   HCT 41.4 03/10/2014   MCV 85.4 03/10/2014   PLT 65* 03/10/2014    ASSESSMENT & PLAN:  ITP (idiopathic thrombocytopenic purpura) She has 0 refractory ITP. She did not respond to rituximab. Choices of treatment included splenectomy, IVIG, platelet transfusions, immunosuppressive therapy such as azathioprine or cyclosporin, or growth factor injections such as Nplate or Promacta. The insurance company is denying her surgery due to unresolved platelet issues. Overall the choices above, I felt that Nplate would be the best and fastest way to bring up her platelet count. I will start her on injection weekly with CBC monitoring weekly. Hopefully,  we can get her platelet count normalized prior to her planned surgery.   Back pain This is to to unresolved bone fracture. I refilled her prescription morphine today.  Tobacco abuse I spent some time counseling the patient the importance of tobacco cessation. she is currently attempting to quit on her own   The risks, benefits and side effects of each treatment options were discussed with the patient and she agreed to proceed with the prescribed plan of care.  All questions were answered. The patient knows to call the clinic with any problems, questions or concerns. No barriers to learning was detected.     [facility], Gracin Soohoo, MD 03/10/2014 1:07 PM

## 2014-03-10 NOTE — Assessment & Plan Note (Signed)
I spent some time counseling the patient the importance of tobacco cessation. she is currently attempting to quit on her own 

## 2014-03-10 NOTE — Assessment & Plan Note (Signed)
She has 0 refractory ITP. She did not respond to rituximab. Choices of treatment included splenectomy, IVIG, platelet transfusions, immunosuppressive therapy such as azathioprine or cyclosporin, or growth factor injections such as Nplate or Promacta. The insurance company is denying her surgery due to unresolved platelet issues. Overall the choices above, I felt that Nplate would be the best and fastest way to bring up her platelet count. I will start her on injection weekly with CBC monitoring weekly. Hopefully, we can get her platelet count normalized prior to her planned surgery.

## 2014-03-10 NOTE — Telephone Encounter (Signed)
Gave pt appt for labs injections and MD for July 2015

## 2014-03-10 NOTE — Assessment & Plan Note (Signed)
This is to to unresolved bone fracture. I refilled her prescription morphine today.

## 2014-03-13 ENCOUNTER — Ambulatory Visit (HOSPITAL_BASED_OUTPATIENT_CLINIC_OR_DEPARTMENT_OTHER): Payer: BC Managed Care – PPO

## 2014-03-13 VITALS — BP 163/66 | HR 78 | Temp 98.8°F | Wt 155.0 lb

## 2014-03-13 DIAGNOSIS — D693 Immune thrombocytopenic purpura: Secondary | ICD-10-CM

## 2014-03-13 MED ORDER — ROMIPLOSTIM 250 MCG ~~LOC~~ SOLR
1.0000 ug/kg | Freq: Once | SUBCUTANEOUS | Status: AC
  Administered 2014-03-13: 70 ug via SUBCUTANEOUS
  Filled 2014-03-13: qty 0.14

## 2014-03-13 NOTE — Patient Instructions (Signed)
Romiplostim injection What is this medicine? ROMIPLOSTIM (roe mi PLOE stim) helps your body make more platelets. This medicine is used to treat low platelets caused by chronic idiopathic thrombocytopenic purpura (ITP). This medicine may be used for other purposes; ask your health care provider or pharmacist if you have questions. COMMON BRAND NAME(S): Nplate What should I tell my health care provider before I take this medicine? They need to know if you have any of these conditions: -cancer or myelodysplastic syndrome -low blood counts, like low white cell, platelet, or red cell counts -take medicines that treat or prevent blood clots -an unusual or allergic reaction to romiplostim, mannitol, other medicines, foods, dyes, or preservatives -pregnant or trying to get pregnant -breast-feeding How should I use this medicine? This medicine is for injection under the skin. It is given by a health care professional in a hospital or clinic setting. A special MedGuide will be given to you before your injection. Read this information carefully each time. Talk to your pediatrician regarding the use of this medicine in children. Special care may be needed. Overdosage: If you think you have taken too much of this medicine contact a poison control center or emergency room at once. NOTE: This medicine is only for you. Do not share this medicine with others. What if I miss a dose? It is important not to miss your dose. Call your doctor or health care professional if you are unable to keep an appointment. What may interact with this medicine? Interactions are not expected. This list may not describe all possible interactions. Give your health care provider a list of all the medicines, herbs, non-prescription drugs, or dietary supplements you use. Also tell them if you smoke, drink alcohol, or use illegal drugs. Some items may interact with your medicine. What should I watch for while using this  medicine? Your condition will be monitored carefully while you are receiving this medicine. Visit your prescriber or health care professional for regular checks on your progress and for the needed blood tests. It is important to keep all appointments. What side effects may I notice from receiving this medicine? Side effects that you should report to your doctor or health care professional as soon as possible: -allergic reactions like skin rash, itching or hives, swelling of the face, lips, or tongue -shortness of breath, chest pain, swelling in a leg -unusual bleeding or bruising Side effects that usually do not require medical attention (report to your doctor or health care professional if they continue or are bothersome): -dizziness -headache -muscle aches -pain in arms and legs -stomach pain -trouble sleeping This list may not describe all possible side effects. Call your doctor for medical advice about side effects. You may report side effects to FDA at 1-800-FDA-1088. Where should I keep my medicine? This drug is given in a hospital or clinic and will not be stored at home. NOTE: This sheet is a summary. It may not cover all possible information. If you have questions about this medicine, talk to your doctor, pharmacist, or health care provider.  2015, [employer]vier/[employer] Standard. (2008-05-01 15:13:04)  

## 2014-03-15 ENCOUNTER — Encounter (HOSPITAL_COMMUNITY): Payer: Self-pay | Admitting: Pharmacy Technician

## 2014-03-20 ENCOUNTER — Encounter (HOSPITAL_COMMUNITY): Payer: Self-pay

## 2014-03-20 ENCOUNTER — Ambulatory Visit (HOSPITAL_BASED_OUTPATIENT_CLINIC_OR_DEPARTMENT_OTHER): Payer: BC Managed Care – PPO

## 2014-03-20 ENCOUNTER — Encounter (HOSPITAL_COMMUNITY)
Admission: RE | Admit: 2014-03-20 | Discharge: 2014-03-20 | Disposition: A | Discharge status: Home / Self Care | Payer: BC Managed Care – PPO | Source: Ambulatory Visit | Attending: Neurological Surgery | Admitting: Neurological Surgery

## 2014-03-20 ENCOUNTER — Other Ambulatory Visit (HOSPITAL_BASED_OUTPATIENT_CLINIC_OR_DEPARTMENT_OTHER): Payer: BC Managed Care – PPO

## 2014-03-20 VITALS — BP 169/73 | HR 72 | Temp 98.6°F

## 2014-03-20 DIAGNOSIS — D693 Immune thrombocytopenic purpura: Secondary | ICD-10-CM

## 2014-03-20 DIAGNOSIS — Z01812 Encounter for preprocedural laboratory examination: Secondary | ICD-10-CM | POA: Insufficient documentation

## 2014-03-20 HISTORY — DX: Depression, unspecified: F32.A

## 2014-03-20 HISTORY — DX: Disease of blood and blood-forming organs, unspecified: D75.9

## 2014-03-20 HISTORY — DX: Unspecified osteoarthritis, unspecified site: M19.90

## 2014-03-20 HISTORY — DX: Major depressive disorder, single episode, unspecified: F32.9

## 2014-03-20 HISTORY — DX: Chronic kidney disease, unspecified: N18.9

## 2014-03-20 LAB — BASIC METABOLIC PANEL
Anion gap: 12 (ref 5–15)
BUN: 13 mg/dL (ref 6–23)
CO2: 26 mEq/L (ref 19–32)
Calcium: 9.1 mg/dL (ref 8.4–10.5)
Chloride: 104 mEq/L (ref 96–112)
Creatinine, Ser: 0.57 mg/dL (ref 0.50–1.10)
GFR calc Af Amer: >90 mL/min (ref >90)
GFR calc non Af Amer: >90 mL/min (ref >90)
Glucose, Bld: 108 mg/dL — ABNORMAL HIGH (ref 70–99)
Potassium: 3.9 mEq/L (ref 3.7–5.3)
Sodium: 142 mEq/L (ref 137–147)

## 2014-03-20 LAB — CBC WITH DIFFERENTIAL/PLATELET
BASO%: 0.3 % (ref 0.0–2.0)
Basophils Absolute: 0 10*3/uL (ref 0.0–0.1)
EOS%: 4.3 % (ref 0.0–7.0)
Eosinophils Absolute: 0.5 10*3/uL (ref 0.0–0.5)
HCT: 40.8 % (ref 34.8–46.6)
HGB: 13.6 g/dL (ref 11.6–15.9)
LYMPH%: 13.7 % — ABNORMAL LOW (ref 14.0–49.7)
MCH: 28.8 pg (ref 25.1–34.0)
MCHC: 33.4 g/dL (ref 31.5–36.0)
MCV: 86.3 fL (ref 79.5–101.0)
MONO#: 0.5 10*3/uL (ref 0.1–0.9)
MONO%: 5.2 % (ref 0.0–14.0)
NEUT#: 8 10*3/uL — ABNORMAL HIGH (ref 1.5–6.5)
NEUT%: 76.5 % (ref 38.4–76.8)
Platelets: 71 10*3/uL — ABNORMAL LOW (ref 145–400)
RBC: 4.72 10*6/uL (ref 3.70–5.45)
RDW: 14.5 % (ref 11.2–14.5)
WBC: 10.5 10*3/uL — ABNORMAL HIGH (ref 3.9–10.3)
lymph#: 1.4 10*3/uL (ref 0.9–3.3)

## 2014-03-20 LAB — SURGICAL PCR SCREEN
MRSA, PCR: NEGATIVE
Staphylococcus aureus: POSITIVE — AB

## 2014-03-20 MED ORDER — ROMIPLOSTIM 250 MCG ~~LOC~~ SOLR
70.0000 ug | Freq: Once | SUBCUTANEOUS | Status: AC
  Administered 2014-03-20: 70 ug via SUBCUTANEOUS
  Filled 2014-03-20: qty 0.14

## 2014-03-20 NOTE — Progress Notes (Signed)
I called a prescription to [facility] , corner of [address]

## 2014-03-20 NOTE — Progress Notes (Signed)
Surgery for her back previously was cancelled due to plt. Count.  She has been under the care of hematology for sometime, getting treatment for it. She doesn't have any complaints of heart or lung issues, denies ever having a stress test or any other advanced heart study. Followed by [facility] primary care.

## 2014-03-20 NOTE — Pre-Procedure Instructions (Signed)
[name]  03/20/2014   Your procedure is scheduled on:  03/28/2014  Report to [facility] Admitting  - ENTRANCE A at 7:45 AM.  Call this number if you have problems the morning of surgery: [phone]   Remember:   Do not eat food or drink liquids after midnight. On MONDAY   Take these medicines the morning of surgery with A SIP OF WATER: PAIN MEDICINE IF NEEDED   Do not wear jewelry, make-up or nail polish.  Do not wear lotions, powders, or perfumes. You may wear deodorant.  Do not shave 48 hours prior to surgery.   Do not bring valuables to the hospital.  [facility] is not responsible for any belongings or valuables.               Contacts, dentures or bridgework may not be worn into surgery.  Leave suitcase in the car. After surgery it may be brought to your room.  For patients admitted to the hospital, discharge time is determined by your                treatment team.               Patients discharged the day of surgery will not be allowed to drive  home.  Name and phone number of your driver: /with daughter   Special Instructions: Special Instructions: [facility] - Preparing for Surgery  Before surgery, you can play an important role.  Because skin is not sterile, your skin needs to be as free of germs as possible.  You can reduce the number of germs on you skin by washing with CHG (chlorahexidine gluconate) soap before surgery.  CHG is an antiseptic cleaner which kills germs and bonds with the skin to continue killing germs even after washing.  Please DO NOT use if you have an allergy to CHG or antibacterial soaps.  If your skin becomes reddened/irritated stop using the CHG and inform your nurse when you arrive at Short Stay.  Do not shave (including legs and underarms) for at least 48 hours prior to the first CHG shower.  You may shave your face.  Please follow these instructions carefully:   1.  Shower with CHG Soap the night before surgery and the   morning of Surgery.  2.  If you choose to wash your hair, wash your hair first as usual with your  normal shampoo.  3.  After you shampoo, rinse your hair and body thoroughly to remove the  Shampoo.  4.  Use CHG as you would any other liquid soap.  You can apply chg directly to the skin and wash gently with scrungie or a clean washcloth.  5.  Apply the CHG Soap to your body ONLY FROM THE NECK DOWN.    Do not use on open wounds or open sores.  Avoid contact with your eyes, ears, mouth and genitals (private parts).  Wash genitals (private parts)   with your normal soap.  6.  Wash thoroughly, paying special attention to the area where your surgery will be performed.  7.  Thoroughly rinse your body with warm water from the neck down.  8.  DO NOT shower/wash with your normal soap after using and rinsing off   the CHG Soap.  9.  Pat yourself dry with a clean towel.            10.  Wear clean pajamas.  11.  Place clean sheets on your bed the night of your first shower and do not sleep with pets.  Day of Surgery  Do not apply any lotions/deodorants the morning of surgery.  Please wear clean clothes to the hospital/surgery center.   Please read over the following fact sheets that you were given: Pain Booklet, Coughing and Deep Breathing, Blood Transfusion Information, MRSA Information and Surgical Site Infection Prevention

## 2014-03-21 LAB — CBC

## 2014-03-21 NOTE — Progress Notes (Addendum)
Anesthesia Chart Review: Patient is a 63 year old female scheduled for anterolateral decompression of L1 fracture with posterior segmental stabilization on 03/28/14 by Dr. Ellene Route. She was initially scheduled for surgery with Dr. Ellene Route and Dr. Luiz Ochoa on 1/202/14, but preoperative labs showed thrombocytopenia and surgery was delayed until this could be further evaluated.  I evaluated her at her PAT visit back in 07/2013 (see my note for details).  She was diagnosed with ITP 08/2013 and treated with prednisone but due to lack of response was started on rituximab completed 10/21/13.  She still did not have the expected response, so this was followed by steroid taper and vitamin K44 and folic acid but ultimately required Nplate 03/09/14.  Other history includes leukocytosis and erythrocytosis (felt likely due to smoking), HTN, occasional tachycardiac/palpitations (not sustained), GERD, depression, nephrolithiasis, arthritis, smoking, hysterectomy. Chest pain is also on her history but she denied this. She does not see a cardiologist. PCP is listed as [name], NP. Hematologist is Dr. Alvy Bimler.  He is aware of plans for surgery and felt Nplate was the best and safest way to bring up her platelet count. (See his 03/10/14 note for additional details.)    EKG on 08/05/13 showed: NSR, possible LAE, LAFB. No comparison EKGs are available. According to my evaluation from 08/05/13, she denied prior cardiac testing. She denies chest pain, significant edema. She does report SOB when lying down that has been present since her compression fracture diagnosis. She felt her breathing is actually better once she's up and moving around. She is not very active and her primary exercise is taking her dog outside for a walk around her house. Prior to her fracture she could vacuum. She does not really have many stairs to climb. I was not asked to re-evaluate her during her most recent PAT visit.   CT of the L-spine on 1020/14  showed: Advanced compression fracture at L1, approaching vertebra plana. Kyphotic angulation at the fracture site of 30 degrees. Posterior bowing of the posterior superior margin of the vertebral body measuring 8 mm. This is slightly more pronounced on the right. Neural compression could occur in this location.   MRI of the L spine on 06/14/13 showed:  1. L1 burst fracture has developed since 2009 and is associated with severe loss of vertebral body height and osseous retropulsion. There is mild mass effect on the conus medullaris. This fracture has a subacute appearance with only mild marrow edema.  2. No other fractures identified.  3. Very mild spondylosis. No disc herniation, spinal stenosis or nerve root encroachment is present at the disc space levels.   CXR on 08/05/13 showed: Mild cardiac enlargement. No pleural effusion or edema identified. No airspace consolidation. Scarring noted within the right midlung. L1 vertebra plana deformity is identified. No acute cardiopulmonary abnormalities.   Preoperative labs noted. Her PLT count on 03/20/14 was 71K (range has been 58-99K since 08/05/13).  WBC 10.5.  H/H 13.6/40.8.  T&S done. I spoke with Janett Billow at Dr. Clarice Pole office regarding current PLT count.  She reports that Dr. Ellene Route has spoken with Dr. Alvy Bimler and that according to Dr. Clarice Pole note, he is aware of plans for Nplate, but that he also discussed the possibility of pre and/or post-operative platelet infusion. Janett Billow said this was be determined by Dr. Alvy Bimler.  I notified her that currently, I do not see any additional blood/platelet orders, but that it appears that patient is scheduled for labs and another injection visit at [facility] on 03/27/14  at 1:30 PM. Further instructions based on these results. Plans to proceed will likely depend on if Dr. Ellene Route feels her follow-up results are acceptable for this procedure.  I'll leave her chart for follow-up.  [facility] Short Stay  Center/Anesthesiology Phone [phone] 03/21/2014 2:49 PM  Addendum: 03/27/2014 1:56 PM CBC today showed a PLT count of 106K. She received NPLATE 70 mcg SQ injection today.

## 2014-03-27 ENCOUNTER — Ambulatory Visit (HOSPITAL_BASED_OUTPATIENT_CLINIC_OR_DEPARTMENT_OTHER): Payer: BC Managed Care – PPO

## 2014-03-27 ENCOUNTER — Other Ambulatory Visit (HOSPITAL_BASED_OUTPATIENT_CLINIC_OR_DEPARTMENT_OTHER): Payer: BC Managed Care – PPO

## 2014-03-27 VITALS — BP 151/76 | Temp 98.3°F

## 2014-03-27 DIAGNOSIS — D693 Immune thrombocytopenic purpura: Secondary | ICD-10-CM

## 2014-03-27 LAB — CBC WITH DIFFERENTIAL/PLATELET
BASO%: 0.3 % (ref 0.0–2.0)
Basophils Absolute: 0 10*3/uL (ref 0.0–0.1)
EOS%: 4 % (ref 0.0–7.0)
Eosinophils Absolute: 0.4 10*3/uL (ref 0.0–0.5)
HCT: 40.8 % (ref 34.8–46.6)
HGB: 13.9 g/dL (ref 11.6–15.9)
LYMPH%: 14.3 % (ref 14.0–49.7)
MCH: 29.1 pg (ref 25.1–34.0)
MCHC: 34.1 g/dL (ref 31.5–36.0)
MCV: 85.4 fL (ref 79.5–101.0)
MONO#: 0.6 10*3/uL (ref 0.1–0.9)
MONO%: 5.9 % (ref 0.0–14.0)
NEUT#: 8 10*3/uL — ABNORMAL HIGH (ref 1.5–6.5)
NEUT%: 75.5 % (ref 38.4–76.8)
Platelets: 106 10*3/uL — ABNORMAL LOW (ref 145–400)
RBC: 4.78 10*6/uL (ref 3.70–5.45)
RDW: 14.3 % (ref 11.2–14.5)
WBC: 10.6 10*3/uL — ABNORMAL HIGH (ref 3.9–10.3)
lymph#: 1.5 10*3/uL (ref 0.9–3.3)
nRBC: 0 % (ref 0–0)

## 2014-03-27 MED ORDER — CEFAZOLIN SODIUM-DEXTROSE 2-3 GM-% IV SOLR
2.0000 g | INTRAVENOUS | Status: AC
  Administered 2014-03-28: 2 g via INTRAVENOUS
  Filled 2014-03-27: qty 50

## 2014-03-27 MED ORDER — ROMIPLOSTIM 250 MCG ~~LOC~~ SOLR
70.0000 ug | Freq: Once | SUBCUTANEOUS | Status: AC
  Administered 2014-03-27: 70 ug via SUBCUTANEOUS
  Filled 2014-03-27: qty 0.14

## 2014-03-28 ENCOUNTER — Encounter (HOSPITAL_COMMUNITY): Payer: BC Managed Care – PPO | Admitting: Vascular Surgery

## 2014-03-28 ENCOUNTER — Inpatient Hospital Stay (HOSPITAL_COMMUNITY): Payer: BC Managed Care – PPO

## 2014-03-28 ENCOUNTER — Inpatient Hospital Stay (HOSPITAL_COMMUNITY): Payer: BC Managed Care – PPO | Admitting: Critical Care Medicine

## 2014-03-28 ENCOUNTER — Encounter (HOSPITAL_COMMUNITY): Payer: Self-pay | Admitting: Critical Care Medicine

## 2014-03-28 ENCOUNTER — Inpatient Hospital Stay (HOSPITAL_COMMUNITY)
Admission: RE | Admit: 2014-03-28 | Discharge: 2014-04-04 | DRG: 457 | Disposition: A | Discharge status: Home Health | Payer: BC Managed Care – PPO | Source: Ambulatory Visit | Attending: Neurological Surgery | Admitting: Neurological Surgery

## 2014-03-28 ENCOUNTER — Other Ambulatory Visit: Payer: Self-pay

## 2014-03-28 ENCOUNTER — Encounter (HOSPITAL_COMMUNITY)
Admission: RE | Disposition: A | Discharge status: Home Health | Payer: BC Managed Care – PPO | Source: Ambulatory Visit | Attending: Neurological Surgery

## 2014-03-28 DIAGNOSIS — Z79899 Other long term (current) drug therapy: Secondary | ICD-10-CM

## 2014-03-28 DIAGNOSIS — J939 Pneumothorax, unspecified: Secondary | ICD-10-CM

## 2014-03-28 DIAGNOSIS — M8448XA Pathological fracture, other site, initial encounter for fracture: Principal | ICD-10-CM | POA: Diagnosis present

## 2014-03-28 DIAGNOSIS — F32A Depression, unspecified: Secondary | ICD-10-CM

## 2014-03-28 DIAGNOSIS — R5082 Postprocedural fever: Secondary | ICD-10-CM | POA: Diagnosis not present

## 2014-03-28 DIAGNOSIS — K5900 Constipation, unspecified: Secondary | ICD-10-CM | POA: Diagnosis not present

## 2014-03-28 DIAGNOSIS — M4804 Spinal stenosis, thoracic region: Secondary | ICD-10-CM | POA: Diagnosis present

## 2014-03-28 DIAGNOSIS — K219 Gastro-esophageal reflux disease without esophagitis: Secondary | ICD-10-CM | POA: Diagnosis present

## 2014-03-28 DIAGNOSIS — S22080S Wedge compression fracture of T11-T12 vertebra, sequela: Secondary | ICD-10-CM

## 2014-03-28 DIAGNOSIS — I498 Other specified cardiac arrhythmias: Secondary | ICD-10-CM | POA: Diagnosis not present

## 2014-03-28 DIAGNOSIS — D508 Other iron deficiency anemias: Secondary | ICD-10-CM

## 2014-03-28 DIAGNOSIS — R071 Chest pain on breathing: Secondary | ICD-10-CM

## 2014-03-28 DIAGNOSIS — D693 Immune thrombocytopenic purpura: Secondary | ICD-10-CM | POA: Diagnosis present

## 2014-03-28 DIAGNOSIS — J9383 Other pneumothorax: Secondary | ICD-10-CM

## 2014-03-28 DIAGNOSIS — F17200 Nicotine dependence, unspecified, uncomplicated: Secondary | ICD-10-CM

## 2014-03-28 DIAGNOSIS — S22080D Wedge compression fracture of T11-T12 vertebra, subsequent encounter for fracture with routine healing: Secondary | ICD-10-CM

## 2014-03-28 DIAGNOSIS — M545 Low back pain: Secondary | ICD-10-CM

## 2014-03-28 DIAGNOSIS — F329 Major depressive disorder, single episode, unspecified: Secondary | ICD-10-CM

## 2014-03-28 DIAGNOSIS — M4000 Postural kyphosis, site unspecified: Secondary | ICD-10-CM | POA: Diagnosis present

## 2014-03-28 DIAGNOSIS — J95811 Postprocedural pneumothorax: Secondary | ICD-10-CM | POA: Diagnosis not present

## 2014-03-28 DIAGNOSIS — M810 Age-related osteoporosis without current pathological fracture: Secondary | ICD-10-CM | POA: Diagnosis present

## 2014-03-28 DIAGNOSIS — S32010S Wedge compression fracture of first lumbar vertebra, sequela: Secondary | ICD-10-CM

## 2014-03-28 DIAGNOSIS — I10 Essential (primary) hypertension: Secondary | ICD-10-CM | POA: Diagnosis present

## 2014-03-28 DIAGNOSIS — Z720 Tobacco use: Secondary | ICD-10-CM

## 2014-03-28 DIAGNOSIS — R112 Nausea with vomiting, unspecified: Secondary | ICD-10-CM | POA: Diagnosis not present

## 2014-03-28 HISTORY — PX: CHEST TUBE INSERTION: SHX231

## 2014-03-28 HISTORY — PX: ANTERIOR LAT LUMBAR FUSION: SHX1168

## 2014-03-28 LAB — CBC
HCT: 45.9 % (ref 36.0–46.0)
HCT: 43.4 % (ref 36.0–46.0)
Hemoglobin: 15.6 g/dL — ABNORMAL HIGH (ref 12.0–15.0)
Hemoglobin: 15.1 g/dL — ABNORMAL HIGH (ref 12.0–15.0)
MCH: 29.8 pg (ref 26.0–34.0)
MCH: 29.7 pg (ref 26.0–34.0)
MCHC: 34 g/dL (ref 30.0–36.0)
MCHC: 34.8 g/dL (ref 30.0–36.0)
MCV: 87.8 fL (ref 78.0–100.0)
MCV: 85.3 fL (ref 78.0–100.0)
Platelets: 110 10*3/uL — ABNORMAL LOW (ref 150–400)
Platelets: 90 10*3/uL — ABNORMAL LOW (ref 150–400)
RBC: 5.23 MIL/uL — ABNORMAL HIGH (ref 3.87–5.11)
RBC: 5.09 MIL/uL (ref 3.87–5.11)
RDW: 14.9 % (ref 11.5–15.5)
RDW: 15.1 % (ref 11.5–15.5)
WBC: 26.9 10*3/uL — ABNORMAL HIGH (ref 4.0–10.5)
WBC: 25.9 10*3/uL — ABNORMAL HIGH (ref 4.0–10.5)

## 2014-03-28 LAB — PREPARE RBC (CROSSMATCH)

## 2014-03-28 LAB — POCT I-STAT 4, (NA,K, GLUC, HGB,HCT)
Glucose, Bld: 146 mg/dL — ABNORMAL HIGH (ref 70–99)
HCT: 24 % — ABNORMAL LOW (ref 36.0–46.0)
Hemoglobin: 8.2 g/dL — ABNORMAL LOW (ref 12.0–15.0)
Potassium: 4.6 mEq/L (ref 3.7–5.3)
Sodium: 138 mEq/L (ref 137–147)

## 2014-03-28 LAB — TROPONIN I: Troponin I: <0.3 ng/mL (ref <0.30)

## 2014-03-28 SURGERY — ANTERIOR LATERAL LUMBAR FUSION 1 LEVEL
Anesthesia: General | Site: Chest

## 2014-03-28 MED ORDER — SODIUM CHLORIDE 0.9 % IJ SOLN: 3.0000 mL | INTRAMUSCULAR | Status: DC | PRN

## 2014-03-28 MED ORDER — BUPIVACAINE HCL (PF) 0.25 % IJ SOLN: INTRAMUSCULAR | Status: DC | PRN

## 2014-03-28 MED ORDER — SURGIFOAM 100 EX MISC
CUTANEOUS | Status: DC | PRN
  Administered 2014-03-28: 11:00:00 via TOPICAL

## 2014-03-28 MED ORDER — OXYCODONE-ACETAMINOPHEN 5-325 MG PO TABS
1.0000 | ORAL_TABLET | ORAL | Status: DC | PRN
  Administered 2014-03-28 – 2014-04-04 (×26): 2 via ORAL
  Filled 2014-03-28 (×26): qty 2

## 2014-03-28 MED ORDER — METOPROLOL SUCCINATE ER 25 MG PO TB24
50.0000 mg | ORAL_TABLET | Freq: Every day | ORAL | Status: DC
  Administered 2014-03-30 – 2014-04-04 (×6): 50 mg via ORAL
  Filled 2014-03-28: qty 2
  Filled 2014-03-28 (×4): qty 1
  Filled 2014-03-28: qty 2
  Filled 2014-03-28: qty 1
  Filled 2014-03-28 (×3): qty 2

## 2014-03-28 MED ORDER — GLYCOPYRROLATE 0.2 MG/ML IJ SOLN
INTRAMUSCULAR | Status: AC
  Filled 2014-03-28: qty 2

## 2014-03-28 MED ORDER — CEFAZOLIN SODIUM-DEXTROSE 2-3 GM-% IV SOLR
INTRAVENOUS | Status: AC
  Filled 2014-03-28: qty 50

## 2014-03-28 MED ORDER — SUCCINYLCHOLINE CHLORIDE 20 MG/ML IJ SOLN
INTRAMUSCULAR | Status: AC
  Filled 2014-03-28: qty 1

## 2014-03-28 MED ORDER — ACETAMINOPHEN 650 MG RE SUPP: 650.0000 mg | RECTAL | Status: DC | PRN

## 2014-03-28 MED ORDER — KETOROLAC TROMETHAMINE 30 MG/ML IJ SOLN
INTRAMUSCULAR | Status: AC
Start: 2014-03-28 — End: 2014-03-28
  Administered 2014-03-28: 15 mg
  Filled 2014-03-28: qty 1

## 2014-03-28 MED ORDER — HYDROMORPHONE HCL PF 1 MG/ML IJ SOLN: 0.2500 mg | INTRAMUSCULAR | Status: DC | PRN

## 2014-03-28 MED ORDER — MIDAZOLAM HCL 5 MG/5ML IJ SOLN
INTRAMUSCULAR | Status: DC | PRN
  Administered 2014-03-28: 2 mg via INTRAVENOUS

## 2014-03-28 MED ORDER — DOCUSATE SODIUM 100 MG PO CAPS
100.0000 mg | ORAL_CAPSULE | Freq: Two times a day (BID) | ORAL | Status: DC
  Administered 2014-03-29 – 2014-04-03 (×9): 100 mg via ORAL
  Filled 2014-03-28 (×12): qty 1

## 2014-03-28 MED ORDER — OXYCODONE HCL 5 MG/5ML PO SOLN: 5.0000 mg | Freq: Once | ORAL | Status: DC | PRN

## 2014-03-28 MED ORDER — PROPOFOL 10 MG/ML IV BOLUS
INTRAVENOUS | Status: AC
  Filled 2014-03-28: qty 20

## 2014-03-28 MED ORDER — MIDAZOLAM HCL 2 MG/2ML IJ SOLN
INTRAMUSCULAR | Status: AC
  Filled 2014-03-28: qty 2

## 2014-03-28 MED ORDER — 0.9 % SODIUM CHLORIDE (POUR BTL) OPTIME
TOPICAL | Status: DC | PRN
  Administered 2014-03-28: 1000 mL

## 2014-03-28 MED ORDER — ACETAMINOPHEN 325 MG PO TABS
650.0000 mg | ORAL_TABLET | ORAL | Status: DC | PRN
  Administered 2014-03-30: 650 mg via ORAL
  Filled 2014-03-28: qty 2

## 2014-03-28 MED ORDER — MORPHINE SULFATE 2 MG/ML IJ SOLN
1.0000 mg | INTRAMUSCULAR | Status: DC | PRN
  Administered 2014-03-28 (×2): 2 mg via INTRAVENOUS
  Administered 2014-03-29 – 2014-03-31 (×12): 4 mg via INTRAVENOUS
  Administered 2014-04-02: 2 mg via INTRAVENOUS
  Filled 2014-03-28 (×10): qty 2
  Filled 2014-03-28 (×4): qty 1
  Filled 2014-03-28 (×2): qty 2

## 2014-03-28 MED ORDER — LIDOCAINE HCL (CARDIAC) 20 MG/ML IV SOLN
INTRAVENOUS | Status: DC | PRN
  Administered 2014-03-28: 70 mg via INTRAVENOUS

## 2014-03-28 MED ORDER — MORPHINE SULFATE 4 MG/ML IJ SOLN
INTRAMUSCULAR | Status: AC
  Administered 2014-03-28: 4 mg
  Filled 2014-03-28: qty 1

## 2014-03-28 MED ORDER — FAMOTIDINE 20 MG PO TABS
20.0000 mg | ORAL_TABLET | Freq: Two times a day (BID) | ORAL | Status: DC
  Administered 2014-03-28: 20 mg via ORAL
  Filled 2014-03-28 (×3): qty 1

## 2014-03-28 MED ORDER — FLEET ENEMA 7-19 GM/118ML RE ENEM
1.0000 | ENEMA | Freq: Once | RECTAL | Status: AC | PRN
  Filled 2014-03-28: qty 1

## 2014-03-28 MED ORDER — SODIUM CHLORIDE 0.9 % IR SOLN
Status: DC | PRN
  Administered 2014-03-28: 11:00:00

## 2014-03-28 MED ORDER — ONDANSETRON HCL 4 MG/2ML IJ SOLN
INTRAMUSCULAR | Status: DC | PRN
  Administered 2014-03-28: 4 mg via INTRAVENOUS

## 2014-03-28 MED ORDER — METHOCARBAMOL 500 MG PO TABS
ORAL_TABLET | ORAL | Status: AC
  Filled 2014-03-28: qty 1

## 2014-03-28 MED ORDER — FENTANYL CITRATE 0.05 MG/ML IJ SOLN
INTRAMUSCULAR | Status: AC
Start: 2014-03-28 — End: 2014-03-28
  Filled 2014-03-28: qty 5

## 2014-03-28 MED ORDER — LACTATED RINGERS IV SOLN
INTRAVENOUS | Status: DC
  Administered 2014-03-28 (×4): via INTRAVENOUS

## 2014-03-28 MED ORDER — THROMBIN 5000 UNITS EX SOLR
OROMUCOSAL | Status: DC | PRN
  Administered 2014-03-28 (×3): via TOPICAL

## 2014-03-28 MED ORDER — ROCURONIUM BROMIDE 50 MG/5ML IV SOLN
INTRAVENOUS | Status: AC
  Filled 2014-03-28: qty 1

## 2014-03-28 MED ORDER — OXYCODONE-ACETAMINOPHEN 5-325 MG PO TABS
ORAL_TABLET | ORAL | Status: AC
  Administered 2014-03-28: 2 via ORAL
  Filled 2014-03-28: qty 2

## 2014-03-28 MED ORDER — PHENOL 1.4 % MT LIQD: 1.0000 | OROMUCOSAL | Status: DC | PRN

## 2014-03-28 MED ORDER — PHENYLEPHRINE HCL 10 MG/ML IJ SOLN
INTRAMUSCULAR | Status: DC | PRN
  Administered 2014-03-28 (×2): 40 ug via INTRAVENOUS

## 2014-03-28 MED ORDER — PROPOFOL 10 MG/ML IV BOLUS
INTRAVENOUS | Status: DC | PRN
  Administered 2014-03-28: 140 mg via INTRAVENOUS

## 2014-03-28 MED ORDER — BISACODYL 10 MG RE SUPP
10.0000 mg | Freq: Every day | RECTAL | Status: DC | PRN
  Administered 2014-04-01: 10 mg via RECTAL
  Filled 2014-03-28: qty 1

## 2014-03-28 MED ORDER — ROCURONIUM BROMIDE 100 MG/10ML IV SOLN
INTRAVENOUS | Status: DC | PRN
  Administered 2014-03-28: 50 mg via INTRAVENOUS

## 2014-03-28 MED ORDER — MENTHOL 3 MG MT LOZG: 1.0000 | LOZENGE | OROMUCOSAL | Status: DC | PRN

## 2014-03-28 MED ORDER — SODIUM CHLORIDE 0.9 % IV SOLN: 250.0000 mL | INTRAVENOUS | Status: DC

## 2014-03-28 MED ORDER — MEPERIDINE HCL 25 MG/ML IJ SOLN: 6.2500 mg | INTRAMUSCULAR | Status: DC | PRN

## 2014-03-28 MED ORDER — FENTANYL CITRATE 0.05 MG/ML IJ SOLN
INTRAMUSCULAR | Status: DC | PRN
  Administered 2014-03-28 (×10): 50 ug via INTRAVENOUS

## 2014-03-28 MED ORDER — ARTIFICIAL TEARS OP OINT
TOPICAL_OINTMENT | OPHTHALMIC | Status: DC | PRN
  Administered 2014-03-28: 1 via OPHTHALMIC

## 2014-03-28 MED ORDER — LIDOCAINE-EPINEPHRINE 1 %-1:100000 IJ SOLN
INTRAMUSCULAR | Status: DC | PRN
  Administered 2014-03-28: 8 mL

## 2014-03-28 MED ORDER — ARTIFICIAL TEARS OP OINT
TOPICAL_OINTMENT | OPHTHALMIC | Status: AC
  Filled 2014-03-28: qty 3.5

## 2014-03-28 MED ORDER — METHOCARBAMOL 500 MG PO TABS
500.0000 mg | ORAL_TABLET | Freq: Four times a day (QID) | ORAL | Status: DC | PRN
  Administered 2014-03-28 – 2014-04-04 (×9): 500 mg via ORAL
  Filled 2014-03-28 (×10): qty 1

## 2014-03-28 MED ORDER — METHOCARBAMOL 1000 MG/10ML IJ SOLN
500.0000 mg | Freq: Four times a day (QID) | INTRAVENOUS | Status: DC | PRN
  Administered 2014-03-31: 500 mg via INTRAVENOUS
  Filled 2014-03-28 (×2): qty 5

## 2014-03-28 MED ORDER — LIDOCAINE HCL (CARDIAC) 20 MG/ML IV SOLN
INTRAVENOUS | Status: AC
  Filled 2014-03-28: qty 5

## 2014-03-28 MED ORDER — NEOSTIGMINE METHYLSULFATE 10 MG/10ML IV SOLN
INTRAVENOUS | Status: AC
Start: 2014-03-28 — End: 2014-03-28
  Filled 2014-03-28: qty 1

## 2014-03-28 MED ORDER — CEFAZOLIN SODIUM 1-5 GM-% IV SOLN
1.0000 g | Freq: Three times a day (TID) | INTRAVENOUS | Status: AC
  Administered 2014-03-28 – 2014-03-29 (×2): 1 g via INTRAVENOUS
  Filled 2014-03-28 (×2): qty 50

## 2014-03-28 MED ORDER — POLYETHYLENE GLYCOL 3350 17 G PO PACK
17.0000 g | PACK | Freq: Every day | ORAL | Status: DC | PRN
  Filled 2014-03-28 (×2): qty 1

## 2014-03-28 MED ORDER — HYDROMORPHONE HCL PF 1 MG/ML IJ SOLN
0.2500 mg | INTRAMUSCULAR | Status: DC | PRN
  Administered 2014-03-28 (×4): 0.5 mg via INTRAVENOUS

## 2014-03-28 MED ORDER — SENNA 8.6 MG PO TABS
1.0000 | ORAL_TABLET | Freq: Two times a day (BID) | ORAL | Status: DC
  Administered 2014-03-29 – 2014-04-03 (×9): 8.6 mg via ORAL
  Filled 2014-03-28 (×14): qty 1

## 2014-03-28 MED ORDER — KETOROLAC TROMETHAMINE 15 MG/ML IJ SOLN
15.0000 mg | Freq: Four times a day (QID) | INTRAMUSCULAR | Status: AC
  Administered 2014-03-28 – 2014-04-02 (×20): 15 mg via INTRAVENOUS
  Filled 2014-03-28 (×27): qty 1

## 2014-03-28 MED ORDER — EPHEDRINE SULFATE 50 MG/ML IJ SOLN
INTRAMUSCULAR | Status: AC
  Filled 2014-03-28: qty 1

## 2014-03-28 MED ORDER — MORPHINE SULFATE 15 MG PO TABS
15.0000 mg | ORAL_TABLET | Freq: Four times a day (QID) | ORAL | Status: DC | PRN
  Administered 2014-03-28 – 2014-04-02 (×3): 15 mg via ORAL
  Filled 2014-03-28 (×3): qty 1

## 2014-03-28 MED ORDER — SODIUM CHLORIDE 0.9 % IJ SOLN
INTRAMUSCULAR | Status: AC
  Filled 2014-03-28: qty 10

## 2014-03-28 MED ORDER — ONDANSETRON HCL 4 MG/2ML IJ SOLN
4.0000 mg | INTRAMUSCULAR | Status: DC | PRN
  Administered 2014-03-28 – 2014-04-04 (×11): 4 mg via INTRAVENOUS
  Filled 2014-03-28 (×10): qty 2

## 2014-03-28 MED ORDER — ONDANSETRON HCL 4 MG/2ML IJ SOLN
INTRAMUSCULAR | Status: AC
  Filled 2014-03-28: qty 2

## 2014-03-28 MED ORDER — ONDANSETRON HCL 4 MG/2ML IJ SOLN: 4.0000 mg | Freq: Once | INTRAMUSCULAR | Status: DC | PRN

## 2014-03-28 MED ORDER — CEFAZOLIN SODIUM-DEXTROSE 2-3 GM-% IV SOLR
2.0000 g | Freq: Once | INTRAVENOUS | Status: AC
  Administered 2014-03-28: 2 g via INTRAVENOUS
  Filled 2014-03-28: qty 50

## 2014-03-28 MED ORDER — FENTANYL CITRATE 0.05 MG/ML IJ SOLN
INTRAMUSCULAR | Status: AC
  Filled 2014-03-28: qty 5

## 2014-03-28 MED ORDER — ALUM & MAG HYDROXIDE-SIMETH 200-200-20 MG/5ML PO SUSP: 30.0000 mL | Freq: Four times a day (QID) | ORAL | Status: DC | PRN

## 2014-03-28 MED ORDER — NEOSTIGMINE METHYLSULFATE 10 MG/10ML IV SOLN
INTRAVENOUS | Status: DC | PRN
  Administered 2014-03-28: 2 mg via INTRAVENOUS

## 2014-03-28 MED ORDER — GLYCOPYRROLATE 0.2 MG/ML IJ SOLN
INTRAMUSCULAR | Status: DC | PRN
Start: 2014-03-28 — End: 2014-03-28
  Administered 2014-03-28: 0.3 mg via INTRAVENOUS

## 2014-03-28 MED ORDER — SODIUM CHLORIDE 0.9 % IV SOLN
INTRAVENOUS | Status: DC
  Administered 2014-03-28 – 2014-03-30 (×3): via INTRAVENOUS

## 2014-03-28 MED ORDER — OXYCODONE HCL 5 MG PO TABS: 5.0000 mg | ORAL_TABLET | Freq: Once | ORAL | Status: DC | PRN

## 2014-03-28 MED ORDER — SODIUM CHLORIDE 0.9 % IV SOLN
INTRAVENOUS | Status: DC | PRN
  Administered 2014-03-28: 15:00:00 via INTRAVENOUS

## 2014-03-28 MED ORDER — HYDROMORPHONE HCL PF 1 MG/ML IJ SOLN
INTRAMUSCULAR | Status: AC
  Administered 2014-03-28: 0.5 mg via INTRAVENOUS
  Filled 2014-03-28: qty 2

## 2014-03-28 MED ORDER — SODIUM CHLORIDE 0.9 % IJ SOLN
3.0000 mL | Freq: Two times a day (BID) | INTRAMUSCULAR | Status: DC
  Administered 2014-03-28 – 2014-04-04 (×10): 3 mL via INTRAVENOUS

## 2014-03-28 MED ORDER — METHOCARBAMOL 500 MG PO TABS
ORAL_TABLET | ORAL | Status: AC
Start: 2014-03-28 — End: 2014-03-29
  Filled 2014-03-28: qty 1

## 2014-03-28 SURGICAL SUPPLY — 86 items
28FR THORACIC CATHETER ×2 IMPLANT
28FR TROCAR CATHETER ×3 IMPLANT
ADH SKN CLS APL DERMABOND .7 (GAUZE/BANDAGES/DRESSINGS) ×3
APL SKNCLS STERI-STRIP NONHPOA (GAUZE/BANDAGES/DRESSINGS)
BAG DECANTER FOR FLEXI CONT (MISCELLANEOUS) ×4 IMPLANT
BENZOIN TINCTURE PRP APPL 2/3 (GAUZE/BANDAGES/DRESSINGS) ×2 IMPLANT
BLADE 10 SAFETY STRL DISP (BLADE) ×4 IMPLANT
BLADE SURG ROTATE 9660 (MISCELLANEOUS) IMPLANT
BONE MATRIX OSTEOCEL PRO MED (Bone Implant) ×4 IMPLANT
BUR MATCHSTICK NEURO 3.0 HP (BURR) ×2 IMPLANT
BUR RND OSTEON ELITE 6.0 (BURR) ×2 IMPLANT
CAGE XCORE II 18-33 ANT THORAC (Neuro Prosthesis/Implant) IMPLANT
CAGE XCORE II 18X27 ANT THORAC (Neuro Prosthesis/Implant) IMPLANT
CAP END 2 TI LOCK SCREW X-CORE (Screw) ×8 IMPLANT
CATH THORACIC 28FR (CATHETERS) ×3 IMPLANT
CHLORAPREP W/TINT 26ML (MISCELLANEOUS) ×3 IMPLANT
CONT SPEC 4OZ CLIKSEAL STRL BL (MISCELLANEOUS) ×4 IMPLANT
CORE XCORE 2 AUTO 18X21-27MM (Neuro Prosthesis/Implant) ×4 IMPLANT
CORE XCORE 2 AUTO 18X24-33MM (Neuro Prosthesis/Implant) ×4 IMPLANT
COVER BACK TABLE 24X17X13 BIG (DRAPES) IMPLANT
COVER TABLE BACK 60X90 (DRAPES) ×4 IMPLANT
DERMABOND ADVANCED (GAUZE/BANDAGES/DRESSINGS) ×1
DERMABOND ADVANCED .7 DNX12 (GAUZE/BANDAGES/DRESSINGS) ×7 IMPLANT
DRAPE C-ARM 42X72 X-RAY (DRAPES) ×5 IMPLANT
DRAPE C-ARMOR (DRAPES) ×6 IMPLANT
DRAPE LAPAROTOMY 100X72X124 (DRAPES) ×6 IMPLANT
DRAPE POUCH INSTRU U-SHP 10X18 (DRAPES) ×6 IMPLANT
DRAPE SURG 17X23 STRL (DRAPES) ×4 IMPLANT
DRSG TELFA 3X8 NADH (GAUZE/BANDAGES/DRESSINGS) ×8 IMPLANT
DURAPREP 26ML APPLICATOR (WOUND CARE) ×5 IMPLANT
ELECT BLADE 6.5 EXT (BLADE) ×4 IMPLANT
ELECT REM PT RETURN 9FT ADLT (ELECTROSURGICAL) ×4
ELECTRODE REM PT RTRN 9FT ADLT (ELECTROSURGICAL) ×5 IMPLANT
ENDCAPS 18X30 (Cap) ×6 IMPLANT
GAUZE SPONGE 4X4 16PLY XRAY LF (GAUZE/BANDAGES/DRESSINGS) ×3 IMPLANT
GLOVE BIO SURGEON STRL SZ 6.5 (GLOVE) ×6 IMPLANT
GLOVE BIO SURGEON STRL SZ7.5 (GLOVE) ×8 IMPLANT
GLOVE BIOGEL PI IND STRL 7.0 (GLOVE) ×1 IMPLANT
GLOVE BIOGEL PI IND STRL 8 (GLOVE) ×4 IMPLANT
GLOVE BIOGEL PI IND STRL 8.5 (GLOVE) ×7 IMPLANT
GLOVE BIOGEL PI INDICATOR 7.0 (GLOVE) ×1
GLOVE BIOGEL PI INDICATOR 8 (GLOVE) ×2
GLOVE BIOGEL PI INDICATOR 8.5 (GLOVE) ×1
GLOVE ECLIPSE 7.5 STRL STRAW (GLOVE) ×10 IMPLANT
GLOVE ECLIPSE 8.5 STRL (GLOVE) ×6 IMPLANT
GLOVE EXAM NITRILE LRG STRL (GLOVE) ×6 IMPLANT
GLOVE EXAM NITRILE MD LF STRL (GLOVE) IMPLANT
GLOVE EXAM NITRILE XL STR (GLOVE) IMPLANT
GLOVE EXAM NITRILE XS STR PU (GLOVE) IMPLANT
GOWN STRL REUS W/ TWL LRG LVL3 (GOWN DISPOSABLE) ×6 IMPLANT
GOWN STRL REUS W/ TWL XL LVL3 (GOWN DISPOSABLE) ×11 IMPLANT
GOWN STRL REUS W/TWL 2XL LVL3 (GOWN DISPOSABLE) ×16 IMPLANT
GOWN STRL REUS W/TWL LRG LVL3 (GOWN DISPOSABLE) ×12
GOWN STRL REUS W/TWL XL LVL3 (GOWN DISPOSABLE) ×20
KIT BASIN OR (CUSTOM PROCEDURE TRAY) ×6 IMPLANT
KIT MAXCESS (KITS) ×3 IMPLANT
KIT ROOM TURNOVER OR (KITS) ×6 IMPLANT
MARKER SKIN DUAL TIP RULER LAB (MISCELLANEOUS) ×2 IMPLANT
NDL HYPO 25X1 1.5 SAFETY (NEEDLE) ×4 IMPLANT
NEEDLE HYPO 25X1 1.5 SAFETY (NEEDLE) ×4 IMPLANT
NS IRRIG 1000ML POUR BTL (IV SOLUTION) ×6 IMPLANT
PACK LAMINECTOMY NEURO (CUSTOM PROCEDURE TRAY) ×6 IMPLANT
PAD ARMBOARD 7.5X6 YLW CONV (MISCELLANEOUS) ×12 IMPLANT
PAD DRESSING TELFA 3X8 NADH (GAUZE/BANDAGES/DRESSINGS) ×1 IMPLANT
PATTIES SURGICAL .5 X.5 (GAUZE/BANDAGES/DRESSINGS) IMPLANT
PATTIES SURGICAL .5 X1 (DISPOSABLE) IMPLANT
PATTIES SURGICAL 1X1 (DISPOSABLE) IMPLANT
SCREW SET ENDCAP (Screw) ×4 IMPLANT
SPONGE GAUZE 4X4 12PLY (GAUZE/BANDAGES/DRESSINGS) ×1 IMPLANT
SPONGE LAP 4X18 X RAY DECT (DISPOSABLE) IMPLANT
STRIP CLOSURE SKIN 1/2X4 (GAUZE/BANDAGES/DRESSINGS) ×1 IMPLANT
SUT CHROMIC 4 0 RB 1X27 (SUTURE) ×3 IMPLANT
SUT SILK  1 MH (SUTURE) ×1
SUT SILK 1 MH (SUTURE) ×1 IMPLANT
SUT VIC AB 1 CT1 18XBRD ANBCTR (SUTURE) ×6 IMPLANT
SUT VIC AB 1 CT1 8-18 (SUTURE) ×8
SUT VIC AB 2-0 CP2 18 (SUTURE) ×10 IMPLANT
SUT VIC AB 3-0 SH 8-18 (SUTURE) ×9 IMPLANT
SYR 20ML ECCENTRIC (SYRINGE) ×6 IMPLANT
SYSTEM SAHARA CHEST DRAIN ATS (WOUND CARE) ×3 IMPLANT
TAPE CLOTH 3X10 TAN LF (GAUZE/BANDAGES/DRESSINGS) ×10 IMPLANT
TAPE CLOTH SURG 4X10 WHT LF (GAUZE/BANDAGES/DRESSINGS) ×2 IMPLANT
TOWEL OR 17X24 6PK STRL BLUE (TOWEL DISPOSABLE) ×8 IMPLANT
TOWEL OR 17X26 10 PK STRL BLUE (TOWEL DISPOSABLE) ×8 IMPLANT
TRAY FOLEY CATH 14FRSI W/METER (CATHETERS) ×6 IMPLANT
WATER STERILE IRR 1000ML POUR (IV SOLUTION) ×6 IMPLANT

## 2014-03-28 NOTE — Op Note (Signed)
Operative note  Procedure-placement of left chest tube, 28 French into left  pleural space  Pre-and postoperative diagnosis-intraoperative left pneumothorax during first lumbar vertebral body  Reconstruction  Surgeon= Tharon Aquas trigt M.D.  Anesthesia-general   Clinical note- the patient is a 63 year old female who was having lumbar decompression and reconstruction with hardware. During the end of the procedure it was noted that air bubbles were arising from the wound in the pleural space which was entered during lumbar exposure. I was called to assess the situation. I scrubbed in and examined the wound and noted air bubbles arising from a small abrasion in the left lower lobe. A 4-0 chromic suture was placed to repair  the leak,The patient needed a chest tube in the left pleural space.  The patient remained stable and the incision was closed by Dr Ellene Route  The patient was repositioned supine, the L chest was prepped and draped. A proper timeout was performed A small incision was made in the L breast crease and a tunnel made to the 5th intercostal space The pleural space was entered with a hemostat and air exited. The 27 F chest tube was inserted and directed to to apex, secured with a silk suture and connected to an underwater pleurovac system Dressings were placed and CXR confirmed proper chest tube position. The patient was extubated  and transferred to recovery area.

## 2014-03-28 NOTE — Transfer of Care (Signed)
Immediate Anesthesia Transfer of Care Note  Patient: [name]  Procedure(s) Performed: Procedure(s) with comments: Anterolateral decompression of L1 fracture with posterior segmental stabilization (N/A) - Anterolateral decompression of Lumbar One fracture with posterior segmental stabilization CHEST TUBE INSERTION (Left)  Patient Location: PACU  Anesthesia Type:General  Level of Consciousness: awake, alert  and oriented  Airway & Oxygen Therapy: Patient Spontanous Breathing and Patient connected to face mask oxygen  Post-op Assessment: Report given to PACU RN, Post -op Vital signs reviewed and stable and Patient moving all extremities X 4  Post vital signs: Reviewed and stable  Complications: No apparent anesthesia complications

## 2014-03-28 NOTE — Anesthesia Preprocedure Evaluation (Addendum)
Anesthesia Evaluation  Patient identified by MRN, date of birth, ID band Patient awake    Reviewed: Allergy & Precautions, H&P , NPO status   History of Anesthesia Complications Negative for: history of anesthetic complications  Airway Mallampati: II  Neck ROM: Full    Dental  (+) Teeth Intact, Poor Dentition, Chipped, Dental Advisory Given   Pulmonary Current Smoker,  breath sounds clear to auscultation        Cardiovascular hypertension, + dysrhythmias Rhythm:Regular Rate:Normal     Neuro/Psych    GI/Hepatic GERD-  ,  Endo/Other    Renal/GU      Musculoskeletal   Abdominal   Peds  Hematology  (+) Blood dyscrasia, , Hx of ITP   Anesthesia Other Findings   Reproductive/Obstetrics                          Anesthesia Physical Anesthesia Plan  ASA: II  Anesthesia Plan: General   Post-op Pain Management:    Induction: Intravenous  Airway Management Planned: Oral ETT  Additional Equipment:   Intra-op Plan:   Post-operative Plan: Extubation in OR  Informed Consent: I have reviewed the patients History and Physical, chart, labs and discussed the procedure including the risks, benefits and alternatives for the proposed anesthesia with the patient or authorized representative who has indicated his/her understanding and acceptance.   Dental advisory given  Plan Discussed with: CRNA and Surgeon  Anesthesia Plan Comments:         Anesthesia Quick Evaluation

## 2014-03-28 NOTE — H&P (Signed)
[name] is an 63 y.o. female.   Chief Complaint: Back pain status post L1 compression/burst fracture HPI: Patient is a 63 year old individual who sustained a spontaneous burst fracture of L1. This occurred nearly a year ago. His initially planned to do a kyphoplasty however she rapidly progressed to kyphosis and had elements of retropulsed bone causing significant stenosis. Surgical decompression was planned however the patient was found to be thrombocytopenic. This required extensive workup and treatment and at this time the patient's platelet count is above 100,000 soap that she is safe to undergo surgical decompression which will be performed via an anterolateral procedure. She has had significant back pain that is been poorly controlled with high-dose oral narcotic analgesics. She is eager to anticipate the surgery, in an effort to get pain relief.  Past Medical History  Diagnosis Date  . Chest pain     denies  . Tachycardia     occ upon exersion, and when put on bp med  . HTN (hypertension)   . Dysrhythmia     occ tachycardia  . History of kidney stones   . GERD (gastroesophageal reflux disease)     occ  . Fracture of vertebra, compression   . ITP (idiopathic thrombocytopenic purpura) 08/29/2013  . Leukocytosis, unspecified 09/05/2013  . Back pain 09/29/2013  . Muscle cramp 11/16/2013  . Depression     states her current situation with activity limitations, she has a low spirit at times.   . Chronic kidney disease     passed stones spontaneously- 40 yrs. ago   . Arthritis     lumbar burst fx, osteoporosis  . Blood dyscrasia     Past Surgical History  Procedure Laterality Date  . Laparotomy Right 09    ovarian cyst  . Total abdominal hysterectomy w/ bilateral salpingoophorectomy  97  . Cesarean section      x2  . Abdominal hysterectomy      Family History  Problem Relation Age of Onset  . Hypertension    . Heart attack    . Leukemia Paternal Uncle     leukemia    Social History:  reports that she has been smoking Cigarettes.  She has a 11.5 pack-year smoking history. She has never used smokeless tobacco. She reports that she does not drink alcohol or use illicit drugs.  Allergies: No Known Allergies  Medications Prior to Admission  Medication Sig Dispense Refill  . acetaminophen (TYLENOL) 500 MG tablet Take 1,000 mg by mouth every 6 (six) hours as needed for moderate pain.      . diphenhydramine-acetaminophen (TYLENOL PM) 25-500 MG TABS Take 2 tablets by mouth at bedtime as needed.      . folic acid (FOLVITE) 1 MG tablet Take 1 tablet (1 mg total) by mouth daily.  30 tablet  1  . metoprolol succinate (TOPROL-XL) 50 MG 24 hr tablet Take 50 mg by mouth daily after supper.       . morphine (MSIR) 15 MG tablet Take 1 tablet (15 mg total) by mouth every 6 (six) hours as needed for severe pain.  90 tablet  0  . ranitidine (ZANTAC) 150 MG tablet Take 150 mg by mouth 2 (two) times daily.      . vitamin B-12 (CYANOCOBALAMIN) 1000 MCG tablet Take 1 tablet (1,000 mcg total) by mouth daily.  30 tablet  1  . Calcium Carbonate (OS-CAL PO) Take 1 tablet by mouth 2 (two) times daily.  Results for orders placed in visit on 03/27/14 (from the past 48 hour(s))  CBC WITH DIFFERENTIAL     Status: Abnormal   Collection Time    03/27/14  1:08 PM      Result Value Ref Range   WBC 10.6 (*) 3.9 - 10.3 10e3/uL   NEUT# 8.0 (*) 1.5 - 6.5 10e3/uL   HGB 13.9  11.6 - 15.9 g/dL   HCT 40.8  34.8 - 46.6 %   Platelets 106 (*) 145 - 400 10e3/uL   MCV 85.4  79.5 - 101.0 fL   MCH 29.1  25.1 - 34.0 pg   MCHC 34.1  31.5 - 36.0 g/dL   RBC 4.78  3.70 - 5.45 10e6/uL   RDW 14.3  11.2 - 14.5 %   lymph# 1.5  0.9 - 3.3 10e3/uL   MONO# 0.6  0.1 - 0.9 10e3/uL   Eosinophils Absolute 0.4  0.0 - 0.5 10e3/uL   Basophils Absolute 0.0  0.0 - 0.1 10e3/uL   NEUT% 75.5  38.4 - 76.8 %   LYMPH% 14.3  14.0 - 49.7 %   MONO% 5.9  0.0 - 14.0 %   EOS% 4.0  0.0 - 7.0 %   BASO% 0.3  0.0 - 2.0  %   nRBC 0  0 - 0 %   No results found.  Review of Systems  HENT: Negative.   Eyes: Negative.   Respiratory: Negative.   Cardiovascular: Negative.   Gastrointestinal: Negative.   Genitourinary: Negative.   Musculoskeletal: Positive for back pain.  Skin: Negative.   Neurological: Positive for weakness.  Endo/Heme/Allergies:       History of thrombocytopenia  Psychiatric/Behavioral: Negative.     Blood pressure 147/85, pulse 83, temperature 98.7 F (37.1 C), temperature source Oral, resp. rate 18, weight 72.576 kg (160 lb), SpO2 97.00%. Physical Exam  Constitutional: She appears well-developed and well-nourished.  HENT:  Head: Normocephalic and atraumatic.  Eyes: Conjunctivae and EOM are normal. Pupils are equal, round, and reactive to light.  Neck: Normal range of motion. Neck supple.  Cardiovascular: Normal rate and regular rhythm.   Respiratory: Effort normal and breath sounds normal.  GI: Bowel sounds are normal.  Musculoskeletal:  Standing radiographs reveal 63 of kyphosis at the T12-L1 level there is a prominent dowager's hump at this level. Motor function appears intact in iliopsoas quadricep tibialis anterior and gastrocs deep tendon reflexes are absent in the patellae and Achilles both sensation is intact. Cranial nerve examination is within the limits of normal.  Skin: Skin is warm and dry.  Psychiatric: She has a normal mood and affect. Her behavior is normal. Judgment and thought content normal.     Assessment/Plan L1 chronic burst fracture with 60 kyphosis T12-L1  Thrombocytopenia  Anterolateral decompression and reconstruction with titanium spacer. Pedicular fixation is necessary.  Kamiya Acord J 03/28/2014, 9:57 AM

## 2014-03-28 NOTE — OR Nursing (Signed)
Per Dr. Golden Circle, ET tube shifted to right and needs be backed up 3 cm, which anesthesia had already done. No pneumothorax present, but areas of density noted most likely atelectasis.

## 2014-03-28 NOTE — Anesthesia Procedure Notes (Signed)
Procedure Name: Intubation Date/Time: 03/28/2014 10:13 AM Performed by: Carola Frost Pre-anesthesia Checklist: Patient identified, Timeout performed, Emergency Drugs available, Suction available and Patient being monitored Patient Re-evaluated:Patient Re-evaluated prior to inductionOxygen Delivery Method: Circle system utilized Preoxygenation: Pre-oxygenation with 100% oxygen Intubation Type: IV induction Ventilation: Mask ventilation without difficulty and Oral airway inserted - appropriate to patient size Laryngoscope Size: Mac and 3 Grade View: Grade I Tube type: Oral Tube size: 7.0 mm Number of attempts: 2 Airway Equipment and Method: Stylet Placement Confirmation: CO2 detector,  positive ETCO2,  ETT inserted through vocal cords under direct vision and breath sounds checked- equal and bilateral Secured at: 21 cm Tube secured with: Tape Dental Injury: Teeth and Oropharynx as per pre-operative assessment  Difficulty Due To: Difficult Airway- due to anterior larynx and Difficulty was anticipated Comments: DLx1 with MAC 3. Grade 4 view.  DLx1 with glidescope. Grade 1 view.

## 2014-03-28 NOTE — Anesthesia Postprocedure Evaluation (Signed)
Anesthesia Post Note  Patient: [name]  Procedure(s) Performed: Procedure(s) (LRB): Anterolateral decompression of L1 fracture with posterior segmental stabilization (N/A) CHEST TUBE INSERTION (Left)  Anesthesia type: general  Patient location: PACU  Post pain: Pain level controlled  Post assessment: Patient's Cardiovascular Status Stable  Last Vitals:  Filed Vitals:   03/28/14 1800  BP: 104/68  Pulse: 107  Temp:   Resp: 14    Post vital signs: Reviewed and stable  Level of consciousness: sedated  Complications: No apparent anesthesia complications

## 2014-03-28 NOTE — H&P (Signed)
PULMONARY / CRITICAL CARE MEDICINE   Name: [name] MRN: [id number] DOB: 1950-12-23    ADMISSION DATE:  03/28/2014 CONSULTATION DATE:  03/28/2014  REFERRING MD :  Ellene Route PRIMARY SERVICE: PCCM  CHIEF COMPLAINT:  PTX post NSG procedure.   BRIEF PATIENT DESCRIPTION: 63 yo female with hx HTN, thrombocytopenia, and CKD. To OR 7/14 for repair of L1 burst fracture. Sustained intraoperative PTX, chest tube placed in OR. To ICU post OP. PCCM to manage ICU care.    SIGNIFICANT EVENTS / STUDIES:  7/14 to OR for repair of L1 burst fracture with stenosis, kyphosis, back pain, chronic. Anterolateral decompression of T12 burst fracture with reconstruction from T11-L2 using nuvasive expandable interbody cage.   LINES / TUBES: Left CT 7/14 >>>  CULTURES: None  ANTIBIOTICS: Periop ancef 7/14  HISTORY OF PRESENT ILLNESS:  63 yo with PMH as below, who sustained a spontaneous burst fracture of L1 one year ago.  Surgical decompression was planned however the patient was found to be thrombocytopenic. This required extensive workup and treatment. Her most recent count was over 100,000 so she was considered safe to undergo surgical repair. She was admitted 7/14 for this purpose and was taken to OR the same day. During the procedure she was noted to have L sided pneumothorax. CT was inserted in OR. Post-operatively placed in ICU for monitoring. PCCM to manage ICU care.   PAST MEDICAL HISTORY :  Past Medical History  Diagnosis Date  . Chest pain     denies  . Tachycardia     occ upon exersion, and when put on bp med  . HTN (hypertension)   . Dysrhythmia     occ tachycardia  . History of kidney stones   . GERD (gastroesophageal reflux disease)     occ  . Fracture of vertebra, compression   . ITP (idiopathic thrombocytopenic purpura) 08/29/2013  . Leukocytosis, unspecified 09/05/2013  . Back pain 09/29/2013  . Muscle cramp 11/16/2013  . Depression     states her current situation with activity  limitations, she has a low spirit at times.   . Chronic kidney disease     passed stones spontaneously- 40 yrs. ago   . Arthritis     lumbar burst fx, osteoporosis  . Blood dyscrasia    Past Surgical History  Procedure Laterality Date  . Laparotomy Right 09    ovarian cyst  . Total abdominal hysterectomy w/ bilateral salpingoophorectomy  97  . Cesarean section      x2  . Abdominal hysterectomy     Prior to Admission medications   Medication Sig Start Date End Date Taking? Authorizing Provider  acetaminophen (TYLENOL) 500 MG tablet Take 1,000 mg by mouth every 6 (six) hours as needed for moderate pain.   Yes Historical Provider, MD  diphenhydramine-acetaminophen (TYLENOL PM) 25-500 MG TABS Take 2 tablets by mouth at bedtime as needed.   Yes Historical Provider, MD  folic acid (FOLVITE) 1 MG tablet Take 1 tablet (1 mg total) by mouth daily. 09/14/13  Yes Heath Lark, MD  metoprolol succinate (TOPROL-XL) 50 MG 24 hr tablet Take 50 mg by mouth daily after supper.  08/18/13  Yes Historical Provider, MD  morphine (MSIR) 15 MG tablet Take 1 tablet (15 mg total) by mouth every 6 (six) hours as needed for severe pain. 03/10/14  Yes Heath Lark, MD  ranitidine (ZANTAC) 150 MG tablet Take 150 mg by mouth 2 (two) times daily.   Yes Historical Provider, MD  vitamin  B-12 (CYANOCOBALAMIN) 1000 MCG tablet Take 1 tablet (1,000 mcg total) by mouth daily. 09/14/13  Yes Heath Lark, MD  Calcium Carbonate (OS-CAL PO) Take 1 tablet by mouth 2 (two) times daily.    Historical Provider, MD   No Known Allergies  FAMILY HISTORY:  Family History  Problem Relation Age of Onset  . Hypertension    . Heart attack    . Leukemia Paternal Uncle     leukemia   SOCIAL HISTORY:  reports that she has been smoking Cigarettes.  She has a 11.5 pack-year smoking history. She has never used smokeless tobacco. She reports that she does not drink alcohol or use illicit drugs.  REVIEW OF SYSTEMS:   Bolds are positive   Constitutional: weight loss, gain, night sweats, Fevers, chills, fatigue .  HEENT: headaches, Sore throat, sneezing, nasal congestion, post nasal drip, Difficulty swallowing, Tooth/dental problems, visual complaints visual changes, ear ache CV:  chest pain, radiates: ,Orthopnea, PND, swelling in lower extremities, dizziness, palpitations, syncope.  GI  heartburn, indigestion, abdominal pain, nausea, vomiting, diarrhea, change in bowel habits, loss of appetite, bloody stools.  Resp: cough, productive: , hemoptysis, dyspnea, chest pain, pleuritic.  Skin: rash or itching or icterus GU: dysuria, change in color of urine, urgency or frequency. flank pain, hematuria  MS: joint pain or swelling. decreased range of motion  Psych: change in mood or affect. depression or anxiety.  Neuro: difficulty with speech, weakness, numbness, ataxia   SUBJECTIVE:   VITAL SIGNS: Temp:  [97.2 F (36.2 C)-98.7 F (37.1 C)] 97.2 F (36.2 C) (07/14 1800) Pulse Rate:  [83-110] 107 (07/14 1800) Resp:  [14-28] 14 (07/14 1800) BP: (102-147)/(30-85) 104/68 mmHg (07/14 1800) SpO2:  [96 %-99 %] 98 % (07/14 1800) Weight:  [72.576 kg (160 lb)] 72.576 kg (160 lb) (07/14 0705) HEMODYNAMICS:   VENTILATOR SETTINGS:   INTAKE / OUTPUT: Intake/Output     07/13 0701 - 07/14 0700 07/14 0701 - 07/15 0700   I.V. (mL/kg)  3900 (53.7)   Blood  1520   Total Intake(mL/kg)  5420 (74.7)   Urine (mL/kg/hr)  395 (0.5)   Blood  2000 (2.4)   Total Output   2395   Net   +3025          PHYSICAL EXAMINATION: General: Alert Female in NAD Neuro:  Spontaneously, awake, alert, oriented HEENT:  [place]/AT, PERRL, No JVD noted Cardiovascular:  RRR Lungs:  Clear anteriorly Abdomen:  Soft, non-tender,  Non-distended Musculoskeletal:  No acute deformity. Moves all extremities equally. Sensation to BLE intact. Skin:  CT in place left chest. Surgical incision to back.   LABS:  CBC  Recent Labs Lab 03/27/14 1308 03/28/14 1316  03/28/14 1630  WBC 10.6*  --  26.9*  HGB 13.9 8.2* 15.6*  HCT 40.8 24.0* 45.9  PLT 106*  --  110*   Coag's No results found for this basename: APTT, INR,  in the last 168 hours BMET  Recent Labs Lab 03/28/14 1316  NA 138  K 4.6  GLUCOSE 146*   Electrolytes No results found for this basename: CALCIUM, MG, PHOS,  in the last 168 hours Sepsis Markers No results found for this basename: LATICACIDVEN, PROCALCITON, O2SATVEN,  in the last 168 hours ABG No results found for this basename: PHART, PCO2ART, PO2ART,  in the last 168 hours Liver Enzymes No results found for this basename: AST, ALT, ALKPHOS, BILITOT, ALBUMIN,  in the last 168 hours Cardiac Enzymes No results found for this basename: TROPONINI, PROBNP,  in the last 168 hours Glucose No results found for this basename: GLUCAP,  in the last 168 hours  Imaging Dg Thoracolumbar Spine  03/28/2014   CLINICAL DATA:  Anterolateral decompression and fusion at L1 fracture  EXAM: DG C-ARM GT 120 MIN; THORACOLUMBAR SPINE - 2 VIEW  FLUOROSCOPY TIME:  1 min 40 seconds  COMPARISON:  CT abdomen and pelvis 09/28/2013  FINDINGS: Six digital C-arm fluoroscopic images obtained intraoperatively or evaluated.  Marked compression fracture of the L1 vertebral body again identified as noted on prior CT with significant focal kyphosis of the thoracolumbar spine at this level.  Images demonstrate localization of the L1 level and placement of an interbody fusion device.  Due to degree of osseous demineralization, bony landmarks of the compressed L1 vertebral body or difficult to visualize.  There appears to be a decrease in the degree of focal kyphosis at the L1 level on the image obtained at 1432 hr.  IMPRESSION: Placement of an interbody fusion device at the previously identified marked L1 compression fracture with decrease in degree of focal kyphosis at L1.  Severe osseous demineralization.   Electronically Signed   By: Lavonia Dana M.D.   On: 03/28/2014  15:31   Dg Chest Portable 1 View  03/28/2014   CLINICAL DATA:  Status post endotracheal tube placement  EXAM: PORTABLE CHEST - 1 VIEW  COMPARISON:  None  FINDINGS: Cardiac shadow is mildly prominent. Right basilar atelectasis is seen. An endotracheal tube is noted at the level of the carina directed towards a right mainstem bronchus and should be withdrawn 2-3 cm. A left-sided chest tube is seen. Some increased density is noted along the course of the chest tube of uncertain significance. No pneumothorax is seen.  IMPRESSION: Endotracheal tube directed towards the right mainstem bronchus. This should be withdrawn 2-3 cm.  Left chest tube in satisfactory position. No pneumothorax is noted. Considerable density is noted along the course of the tube which may be related atelectasis.  Right basilar atelectasis is noted as well.  Critical Value/emergent results were called by telephone at the time of interpretation on 03/28/2014 at 4:02 pm to [place], the patient's nurse, who verbally acknowledged these results.   Electronically Signed   By: Inez Catalina M.D.   On: 03/28/2014 16:02   Dg C-arm Gt 120 Min  03/28/2014   CLINICAL DATA:  Anterolateral decompression and fusion at L1 fracture  EXAM: DG C-ARM GT 120 MIN; THORACOLUMBAR SPINE - 2 VIEW  FLUOROSCOPY TIME:  1 min 40 seconds  COMPARISON:  CT abdomen and pelvis 09/28/2013  FINDINGS: Six digital C-arm fluoroscopic images obtained intraoperatively or evaluated.  Marked compression fracture of the L1 vertebral body again identified as noted on prior CT with significant focal kyphosis of the thoracolumbar spine at this level.  Images demonstrate localization of the L1 level and placement of an interbody fusion device.  Due to degree of osseous demineralization, bony landmarks of the compressed L1 vertebral body or difficult to visualize.  There appears to be a decrease in the degree of focal kyphosis at the L1 level on the image obtained at 1432 hr.  IMPRESSION:  Placement of an interbody fusion device at the previously identified marked L1 compression fracture with decrease in degree of focal kyphosis at L1.  Severe osseous demineralization.   Electronically Signed   By: Lavonia Dana M.D.   On: 03/28/2014 15:31     CXR: ETT in R mainstem, CT position ok, no PTX. Opacity in L  lung. ? ATX  ASSESSMENT / PLAN:  NEUROLOGIC / MUSCULOSKELETAL A:   S/p surgical repair of L1 burst fracture Pain management  P:   Management per Neurosurgery Toradol, morphine, oxycodone for pain per NSG Robaxin Neurochecks q4  PULMONARY A: L PTX  P:   Supplemental O2, titrate to maintain SpO2 greater than 92% Chest tube to suction 20 cm/H2O Daily CXR Encourage IS  CARDIOVASCULAR A:  Chest pain likely 2nd to CT, but described as tightness.  H/o HTN  P:  Continuous cardiac monitoring  ECG STAT Cycle troponin Continue home metoprolol  RENAL A:   H/o CKD creat WNL presently.   P:   Check chemistry Gentle IVF hydration. NS @ 75/hr.   GASTROINTESTINAL A:   GERD  P:   Home famotidine   HEMATOLOGIC A:   Thrombocytopenia - Chronic ?Hemoconcentration VTE ppx   P:  IVF @ 75/hr SCD's Follow CBC  INFECTIOUS A:   Leukocytosis, Fever. ?Post Op  P:   Monitor clinically   ENDOCRINE A:   No acute issues   P:   Monitor  Georgann Housekeeper, ACNP [place] [facility]/Critical Care Pager [phone] or [phone]   I have personally obtained a history, examined the patient, evaluated laboratory and imaging results, formulated the assessment and plan and placed orders. CRITICAL CARE: The patient is critically ill with multiple organ systems failure and requires high complexity decision making for assessment and support, frequent evaluation and titration of therapies, application of advanced monitoring technologies and extensive interpretation of multiple databases. Critical Care Time devoted to patient care services described in this note is  ___ minutes.

## 2014-03-28 NOTE — Op Note (Signed)
Date of surgery: 7 14/15 Preoperative diagnosis: T12 burst fracture with stenosis, kyphosis, back pain Postoperative diagnosis: T12 burst fracture with stenosis, kyphosis, back pain, chronic Procedure: And anterolateral decompression of T12 burst fracture with reconstruction from T11-L2 using nuvasive expandable interbody cage. Surgeon: Kristeen Miss First assistant: Albertina Parr Complication: Pneumothorax Anesthesia: General endotracheal Indications: The patient is a 63 year old individual who has had a chronic depression fracture that is actually a burst fracture of the L1 vertebrae. She is 65 of kyphosis. She's had severe and unrelenting back pain. She's been using high doses of oral narcotic medications and has experienced poor pain control. She was advised regarding the need for surgical decompression to relieve severe stenosis at the level of the L1 vertebrae. Reconstructions also being performed to reduce the degree of kyphosis that she is experiencing there.  Procedure: Patient was brought to the operating room supine on a stretcher. After the smooth induction of general endotracheal anesthesia, she was placed in the right lateral decubitus position. She was taped to the operating table in the appropriate position and positioning was checked radiographically for orthogonally of the fracture site. The skin above the fracture site was then identified and marked and this area was prepped with alcohol and DuraPrep. After sterile draping a linear incision was made in the chosen site and this was carried down through the fascia. Space between the bottom of the 10th and the 11th rib that could be palpated was identified and by blunt dissection entered into the pleural space. A probe was then passed over the finger to place it in the fracture site. The top of the psoas muscles encountered here. Self-retaining retractor was placed into the wound and dilated slightly to allow better visualization. The  fascia overlying the lateral vertebrae was then dissected medially anteriorly superiorly and inferiorly and the retractor was again repositioned. Radiographically I confirmed the orientation of the collapsed disc of L1-L2 and T12-L1.  At this point vertebrectomy was started using a combination of curettes and rongeurs to remove fragments of disc from the disc spaces at T12-L1 and L1-L2. Then the vertebral pieces were removed in a piecemeal fashion to decompress this area. As a decompression ensued I worked posteriorly to decompress and remove a large fragment of bone that was compressing the spinal canal. This was mostly from the superior portion of the vertebral body of L1. Once this was removed the dura could be identified and was noted to protrude easily into the defect created by removal of the bone. The endplates of R42 was identified and secured. It is noted that the bone of the vertebrae was extremely soft. This included not only the L1 vertebrae of the top of L2 bottom of T12. When adequate decompression was completed hemostasis was achieved. This was done using a combination of Gelfoam soaked thrombin patties and direct pressure. The patient is thrombocytopenic and this did add to the degree of oozing that was experienced. Nonetheless it did seem to be manageable during the case. Para an initial interbody spacer was composed of an 18 x 30 mm footplate with a 24 mm expandable cage between the plates. This was expanded to a total of 32 mm. Radiographic confirmation was obtained as noted that the inferior portion of the footplate had compressed into the L2 vertebral body through the endplates. This was done information of the degree of softness of the bone that was present. The implant was removed and a larger implant was assembled and this use the same size for  plates by cleaning ventrally I was able to relocate the implant between T12 and L2 and expanded to a total of 35 mm in height. This extended into  the vertebral body of L2. This achieved some reduction of the kyphosis. Hemostasis was then checked and the wound. It is felt that the distraction was acceptable and the cage was locked into place. Bone graft in the form of osseous cell was then packed around the cage itself. Care was taken in nature that it didn't compress into the spinal canal. During this time is also noted that there was a small air leak from the inferior portion of the lung. At this time I contacted Dr. Collier Salina ventrally who came to inspect this perform closure in place a chest tube. Continued the closure of the case with removal of the retractor inspecting the wound and putting 2-0 Vicryl suture in the pleural border 2-0 Vicryl in the fascia 2-0 Vicryl subcutaneous tissues and 3-0 Vicryl subcuticularly. Blood loss for the procedure was estimated at about 2000 cc and approximately 800 cc of Cell Saver blood was returned to the patient.

## 2014-03-29 ENCOUNTER — Encounter (HOSPITAL_COMMUNITY): Payer: Self-pay | Admitting: *Deleted

## 2014-03-29 ENCOUNTER — Inpatient Hospital Stay (HOSPITAL_COMMUNITY): Payer: BC Managed Care – PPO

## 2014-03-29 DIAGNOSIS — M8448XD Pathological fracture, other site, subsequent encounter for fracture with routine healing: Secondary | ICD-10-CM

## 2014-03-29 DIAGNOSIS — D693 Immune thrombocytopenic purpura: Secondary | ICD-10-CM

## 2014-03-29 DIAGNOSIS — J9383 Other pneumothorax: Secondary | ICD-10-CM

## 2014-03-29 LAB — CBC
HCT: 39.3 % (ref 36.0–46.0)
Hemoglobin: 13.3 g/dL (ref 12.0–15.0)
MCH: 29 pg (ref 26.0–34.0)
MCHC: 33.8 g/dL (ref 30.0–36.0)
MCV: 85.6 fL (ref 78.0–100.0)
Platelets: 80 10*3/uL — ABNORMAL LOW (ref 150–400)
RBC: 4.59 MIL/uL (ref 3.87–5.11)
RDW: 15.6 % — ABNORMAL HIGH (ref 11.5–15.5)
WBC: 23 10*3/uL — ABNORMAL HIGH (ref 4.0–10.5)

## 2014-03-29 LAB — BASIC METABOLIC PANEL
Anion gap: 13 (ref 5–15)
BUN: 17 mg/dL (ref 6–23)
CO2: 23 mEq/L (ref 19–32)
Calcium: 7.5 mg/dL — ABNORMAL LOW (ref 8.4–10.5)
Chloride: 102 mEq/L (ref 96–112)
Creatinine, Ser: 0.87 mg/dL (ref 0.50–1.10)
GFR calc Af Amer: 82 mL/min — ABNORMAL LOW (ref >90)
GFR calc non Af Amer: 70 mL/min — ABNORMAL LOW (ref >90)
Glucose, Bld: 128 mg/dL — ABNORMAL HIGH (ref 70–99)
Potassium: 5.1 mEq/L (ref 3.7–5.3)
Sodium: 138 mEq/L (ref 137–147)

## 2014-03-29 LAB — TYPE AND SCREEN
ABO/RH(D): A POS
Antibody Screen: NEGATIVE
Unit division: 0
Unit division: 0

## 2014-03-29 LAB — TROPONIN I
Troponin I: <0.3 ng/mL (ref <0.30)
Troponin I: <0.3 ng/mL (ref <0.30)

## 2014-03-29 MED ORDER — WHITE PETROLATUM GEL
Status: AC
Start: 2014-03-29 — End: 2014-03-30
  Administered 2014-03-30
  Filled 2014-03-29: qty 5

## 2014-03-29 MED ORDER — SODIUM CHLORIDE 0.9 % IV BOLUS (SEPSIS)
500.0000 mL | Freq: Once | INTRAVENOUS | Status: AC
  Administered 2014-03-29: 500 mL via INTRAVENOUS

## 2014-03-29 MED ORDER — PANTOPRAZOLE SODIUM 40 MG PO TBEC
40.0000 mg | DELAYED_RELEASE_TABLET | Freq: Every day | ORAL | Status: DC
  Administered 2014-03-29 – 2014-04-03 (×5): 40 mg via ORAL
  Filled 2014-03-29 (×4): qty 1

## 2014-03-29 MED FILL — Heparin Sodium (Porcine) Inj 1000 Unit/ML: INTRAMUSCULAR | Qty: 30 | Status: AC

## 2014-03-29 MED FILL — Sodium Chloride IV Soln 0.9%: INTRAVENOUS | Qty: 3000 | Status: AC

## 2014-03-29 NOTE — Progress Notes (Signed)
UR completed.  PT/OT evals scheduled for today. Await their recommendations to make any arrangements.  Sandi Mariscal, RN BSN [place] CCM Trauma/Neuro ICU Case Manager [phone]

## 2014-03-29 NOTE — Evaluation (Signed)
Physical Therapy Evaluation Patient Details Name: [name] MRN: [id number] DOB: 01/02/1951 Today's Date: 03/29/2014   History of Present Illness  63 yo female with hx HTN, thrombocytopenia, and CKD. To OR 7/14 for repair of L1 burst fracture - L1 fusion. Sustained intraoperative PTX, chest tube placed in OR. To ICU post OP  Clinical Impression  Patient demonstrates deficits in functional mobility as indicated below. Will need continued skilled PT to address deficits and maximize function. Will see as indicated and progress as tolerated.     Follow Up Recommendations Home health PT    Equipment Recommendations  Rolling walker with 5" wheels;3in1 (PT)    Recommendations for Other Services       Precautions / Restrictions Precautions Precautions: Back (chest tube in place) Precaution Booklet Issued: Yes (comment) Precaution Comments: educated verbally on precautions Restrictions Weight Bearing Restrictions: No      Mobility  Bed Mobility Overal bed mobility: +2 for physical assistance;Needs Assistance Bed Mobility: Rolling;Sidelying to Sit Rolling: Mod assist Sidelying to sit: Mod assist;+2 for physical assistance       General bed mobility comments: limited primarily by pain  Transfers Overall transfer level: Needs assistance Equipment used: Rolling walker (2 wheeled)   Sit to Stand: Min assist;+2 safety/equipment Stand pivot transfers: Min assist;+2 safety/equipment       General transfer comment: vc for hand placement and tofollow back precautions  Ambulation/Gait Ambulation/Gait assistance: Min assist Ambulation Distance (Feet): 90 Feet Assistive device: Rolling walker (2 wheeled) Gait Pattern/deviations: Step-through pattern;Decreased stride length Gait velocity: decreased Gait velocity interpretation: <1.8 ft/sec, indicative of risk for recurrent falls General Gait Details: patient reports increased pain in left chest during ambulation ()nsg aware)  limited by pain and fatigue  Stairs            Wheelchair Mobility    Modified Rankin (Stroke Patients Only)       Balance     Sitting balance-Leahy Scale: Fair       Standing balance-Leahy Scale: Fair                               Pertinent Vitals/Pain VSS, pain in left chest region (nsg aware) 8.10    Home Living Family/patient expects to be discharged to:: Private residence Living Arrangements: Children (daughter currently living with patient) Available Help at Discharge: Family;Available PRN/intermittently Type of Home: House Home Access: Stairs to enter Entrance Stairs-Rails: Right Entrance Stairs-Number of Steps: 3 Home Layout: One level Home Equipment: None Additional Comments: tub shower    Prior Function Level of Independence: Independent         Comments: was in TLSO prior to surgery     Hand Dominance   Dominant Hand: Right    Extremity/Trunk Assessment   Upper Extremity Assessment: Overall WFL for tasks assessed           Lower Extremity Assessment: Overall WFL for tasks assessed      Cervical / Trunk Assessment: Normal  Communication   Communication: No difficulties (wears glasses all the time)  Cognition Arousal/Alertness: Awake/alert Behavior During Therapy: WFL for tasks assessed/performed Overall Cognitive Status: Within Functional Limits for tasks assessed                      General Comments General comments (skin integrity, edema, etc.): chest tube    Exercises        Assessment/Plan    PT Assessment Patient  needs continued PT services  PT Diagnosis Difficulty walking;Abnormality of gait;Generalized weakness;Acute pain   PT Problem List Decreased strength;Decreased range of motion;Decreased activity tolerance;Decreased balance;Decreased mobility;Decreased knowledge of use of DME;Decreased knowledge of precautions;Pain  PT Treatment Interventions DME instruction;Gait training;Stair  training;Functional mobility training;Therapeutic activities;Therapeutic exercise;Balance training;Patient/family education   PT Goals (Current goals can be found in the Care Plan section) Acute Rehab PT Goals Patient Stated Goal: to go home PT Goal Formulation: With patient Time For Goal Achievement: 04/12/14 Potential to Achieve Goals: Good    Frequency Min 5X/week   Barriers to discharge Decreased caregiver support (daughter works )      Building control surveyor of Session Equipment Utilized During Treatment: Gait belt Activity Tolerance: Patient limited by pain Patient left: in chair;with call bell/phone within reach Nurse Communication: Mobility status         Time: 0910-0943 PT Time Calculation (min): 33 min           Duncan Dull 03/29/2014, 3:08 PM Alben Deeds, [place] DPT  [phone]

## 2014-03-29 NOTE — Progress Notes (Signed)
Subjective: Patient reports Alert with soreness in back lower extremity function appears intact  Objective: Vital signs in last 24 hours: Temp:  [97.2 F (36.2 C)-99.5 F (37.5 C)] 99.5 F (37.5 C) (07/15 0800) Pulse Rate:  [87-110] 91 (07/15 0800) Resp:  [14-28] 16 (07/15 0800) BP: (97-138)/(30-98) 105/52 mmHg (07/15 0800) SpO2:  [95 %-99 %] 96 % (07/15 0800) Weight:  [76.6 kg (168 lb 14 oz)] 76.6 kg (168 lb 14 oz) (07/15 0600)  Intake/Output from previous day: 07/14 0701 - 07/15 0700 In: 7392.5 [P.O.:520; I.V.:4752.5; Blood:1520; IV Piggyback:600] Out: 2847 [Urine:745; Blood:2000; Chest Tube:102] Intake/Output this shift:    Dressing on side is dry and clean. Motor function appears intact in lower extremity  Lab Results:  Recent Labs  03/28/14 1940 03/29/14 0057  WBC 25.9* 23.0*  HGB 15.1* 13.3  HCT 43.4 39.3  PLT 90* 80*   BMET  Recent Labs  03/28/14 1316 03/29/14 0057  NA 138 138  K 4.6 5.1  CL  --  102  CO2  --  23  GLUCOSE 146* 128*  BUN  --  17  CREATININE  --  0.87  CALCIUM  --  7.5*    Studies/Results: Dg Thoracolumbar Spine  03/28/2014   CLINICAL DATA:  Anterolateral decompression and fusion at L1 fracture  EXAM: DG C-ARM GT 120 MIN; THORACOLUMBAR SPINE - 2 VIEW  FLUOROSCOPY TIME:  1 min 40 seconds  COMPARISON:  CT abdomen and pelvis 09/28/2013  FINDINGS: Six digital C-arm fluoroscopic images obtained intraoperatively or evaluated.  Marked compression fracture of the L1 vertebral body again identified as noted on prior CT with significant focal kyphosis of the thoracolumbar spine at this level.  Images demonstrate localization of the L1 level and placement of an interbody fusion device.  Due to degree of osseous demineralization, bony landmarks of the compressed L1 vertebral body or difficult to visualize.  There appears to be a decrease in the degree of focal kyphosis at the L1 level on the image obtained at 1432 hr.  IMPRESSION: Placement of an  interbody fusion device at the previously identified marked L1 compression fracture with decrease in degree of focal kyphosis at L1.  Severe osseous demineralization.   Electronically Signed   By: Lavonia Dana M.D.   On: 03/28/2014 15:31   Dg Chest Port 1 View  03/29/2014   CLINICAL DATA:  63 year old female with left pneumothorax. Initial encounter.  EXAM: PORTABLE CHEST - 1 VIEW  COMPARISON:  03/28/2014 and earlier.  FINDINGS: Portable AP semi upright view at 0548 hrs. Extubated. Mildly lower lung volumes. Stable cardiac size and mediastinal contours.  Stable left chest tube. No pneumothorax identified. Mildly increased bilateral platelike perihilar atelectasis. More generalized increased density in the left lung has mildly regressed. No definite pleural effusion.  IMPRESSION: 1. Extubated.  Increased platelike perihilar atelectasis. 2. Stable left chest tube.  No pneumothorax.   Electronically Signed   By: Lars Pinks M.D.   On: 03/29/2014 07:54   Dg Chest Portable 1 View  03/28/2014   CLINICAL DATA:  Status post endotracheal tube placement  EXAM: PORTABLE CHEST - 1 VIEW  COMPARISON:  None  FINDINGS: Cardiac shadow is mildly prominent. Right basilar atelectasis is seen. An endotracheal tube is noted at the level of the carina directed towards a right mainstem bronchus and should be withdrawn 2-3 cm. A left-sided chest tube is seen. Some increased density is noted along the course of the chest tube of uncertain significance. No pneumothorax is  seen.  IMPRESSION: Endotracheal tube directed towards the right mainstem bronchus. This should be withdrawn 2-3 cm.  Left chest tube in satisfactory position. No pneumothorax is noted. Considerable density is noted along the course of the tube which may be related atelectasis.  Right basilar atelectasis is noted as well.  Critical Value/emergent results were called by telephone at the time of interpretation on 03/28/2014 at 4:02 pm to [place], the patient's nurse, who  verbally acknowledged these results.   Electronically Signed   By: Inez Catalina M.D.   On: 03/28/2014 16:02   Dg C-arm Gt 120 Min  03/28/2014   CLINICAL DATA:  Anterolateral decompression and fusion at L1 fracture  EXAM: DG C-ARM GT 120 MIN; THORACOLUMBAR SPINE - 2 VIEW  FLUOROSCOPY TIME:  1 min 40 seconds  COMPARISON:  CT abdomen and pelvis 09/28/2013  FINDINGS: Six digital C-arm fluoroscopic images obtained intraoperatively or evaluated.  Marked compression fracture of the L1 vertebral body again identified as noted on prior CT with significant focal kyphosis of the thoracolumbar spine at this level.  Images demonstrate localization of the L1 level and placement of an interbody fusion device.  Due to degree of osseous demineralization, bony landmarks of the compressed L1 vertebral body or difficult to visualize.  There appears to be a decrease in the degree of focal kyphosis at the L1 level on the image obtained at 1432 hr.  IMPRESSION: Placement of an interbody fusion device at the previously identified marked L1 compression fracture with decrease in degree of focal kyphosis at L1.  Severe osseous demineralization.   Electronically Signed   By: Lavonia Dana M.D.   On: 03/28/2014 15:31    Assessment/Plan: Stable postop, platelet count 80,000. White count 23,000  LOS: 1 day  Mobilize as tolerated and obtain a good x-ray of surgical site. Will send for CT scanning   Jaquari Reckner J 03/29/2014, 9:12 AM

## 2014-03-29 NOTE — Progress Notes (Signed)
PULMONARY / CRITICAL CARE MEDICINE   Name: [name] MRN: [id number] DOB: 21-Apr-1951    ADMISSION DATE:  03/28/2014 CONSULTATION DATE:  03/28/2014  REFERRING MD :  Ellene Route  CHIEF COMPLAINT:  PTX post NSG procedure.   BRIEF PATIENT DESCRIPTION: 63 yo female with hx HTN, thrombocytopenia, and CKD. To OR 7/14 for repair of L1 burst fracture. Sustained intraoperative PTX, chest tube placed in OR. To ICU post OP. PCCM to manage ICU care.    SIGNIFICANT EVENTS: 7/14 Anterior decompression T12 burst fx, Lt PTX >> chest tube by TCTS 7/15 Change to water seal  STUDIES:  LINES / TUBES: Left chest tube 7/14 >>>  CULTURES: None  ANTIBIOTICS: Periop ancef 7/14  SUBJECTIVE:  C/o discomfort in Lt chest.  VITAL SIGNS: Temp:  [97.2 F (36.2 C)-99.5 F (37.5 C)] 99.5 F (37.5 C) (07/15 0800) Pulse Rate:  [87-110] 91 (07/15 0800) Resp:  [14-28] 16 (07/15 0800) BP: (97-138)/(30-98) 105/52 mmHg (07/15 0800) SpO2:  [95 %-99 %] 96 % (07/15 0800) Weight:  [168 lb 14 oz (76.6 kg)] 168 lb 14 oz (76.6 kg) (07/15 0600) INTAKE / OUTPUT: Intake/Output     07/14 0701 - 07/15 0700 07/15 0701 - 07/16 0700   P.O. 520    I.V. (mL/kg) 4752.5 (62)    Blood 1520    IV Piggyback 600    Total Intake(mL/kg) 7392.5 (96.5)    Urine (mL/kg/hr) 745 (0.4)    Blood 2000 (1.1)    Chest Tube 102 (0.1)    Total Output 2847     Net +4545.5            PHYSICAL EXAMINATION: General: no distress Neuro: normal strength HEENT: no sinus tenderness Cardiovascular: regular, tachycardic, no murmur Lungs: scattered rhonchi, Lt chest tube at -20 cm suction >> no airleak Abdomen:  Soft, non-tender Musculoskeletal: no edema Skin: no rashes  LABS:  CBC  Recent Labs Lab 03/28/14 1630 03/28/14 1940 03/29/14 0057  WBC 26.9* 25.9* 23.0*  HGB 15.6* 15.1* 13.3  HCT 45.9 43.4 39.3  PLT 110* 90* 80*   BMET  Recent Labs Lab 03/28/14 1316 03/29/14 0057  NA 138 138  K 4.6 5.1  CL  --  102  CO2  --   23  BUN  --  17  CREATININE  --  0.87  GLUCOSE 146* 128*   Electrolytes  Recent Labs Lab 03/29/14 0057  CALCIUM 7.5*   Cardiac Enzymes  Recent Labs Lab 03/28/14 1940 03/29/14 0057  TROPONINI <0.30 <0.30   Imaging Dg Thoracolumbar Spine  03/28/2014   CLINICAL DATA:  Anterolateral decompression and fusion at L1 fracture  EXAM: DG C-ARM GT 120 MIN; THORACOLUMBAR SPINE - 2 VIEW  FLUOROSCOPY TIME:  1 min 40 seconds  COMPARISON:  CT abdomen and pelvis 09/28/2013  FINDINGS: Six digital C-arm fluoroscopic images obtained intraoperatively or evaluated.  Marked compression fracture of the L1 vertebral body again identified as noted on prior CT with significant focal kyphosis of the thoracolumbar spine at this level.  Images demonstrate localization of the L1 level and placement of an interbody fusion device.  Due to degree of osseous demineralization, bony landmarks of the compressed L1 vertebral body or difficult to visualize.  There appears to be a decrease in the degree of focal kyphosis at the L1 level on the image obtained at 1432 hr.  IMPRESSION: Placement of an interbody fusion device at the previously identified marked L1 compression fracture with decrease in degree of focal kyphosis at  L1.  Severe osseous demineralization.   Electronically Signed   By: Lavonia Dana M.D.   On: 03/28/2014 15:31   Dg Chest Port 1 View  03/29/2014   CLINICAL DATA:  63 year old female with left pneumothorax. Initial encounter.  EXAM: PORTABLE CHEST - 1 VIEW  COMPARISON:  03/28/2014 and earlier.  FINDINGS: Portable AP semi upright view at 0548 hrs. Extubated. Mildly lower lung volumes. Stable cardiac size and mediastinal contours.  Stable left chest tube. No pneumothorax identified. Mildly increased bilateral platelike perihilar atelectasis. More generalized increased density in the left lung has mildly regressed. No definite pleural effusion.  IMPRESSION: 1. Extubated.  Increased platelike perihilar atelectasis.  2. Stable left chest tube.  No pneumothorax.   Electronically Signed   By: Lars Pinks M.D.   On: 03/29/2014 07:54   Dg Chest Portable 1 View  03/28/2014   CLINICAL DATA:  Status post endotracheal tube placement  EXAM: PORTABLE CHEST - 1 VIEW  COMPARISON:  None  FINDINGS: Cardiac shadow is mildly prominent. Right basilar atelectasis is seen. An endotracheal tube is noted at the level of the carina directed towards a right mainstem bronchus and should be withdrawn 2-3 cm. A left-sided chest tube is seen. Some increased density is noted along the course of the chest tube of uncertain significance. No pneumothorax is seen.  IMPRESSION: Endotracheal tube directed towards the right mainstem bronchus. This should be withdrawn 2-3 cm.  Left chest tube in satisfactory position. No pneumothorax is noted. Considerable density is noted along the course of the tube which may be related atelectasis.  Right basilar atelectasis is noted as well.  Critical Value/emergent results were called by telephone at the time of interpretation on 03/28/2014 at 4:02 pm to [place], the patient's nurse, who verbally acknowledged these results.   Electronically Signed   By: Inez Catalina M.D.   On: 03/28/2014 16:02   Dg C-arm Gt 120 Min  03/28/2014   CLINICAL DATA:  Anterolateral decompression and fusion at L1 fracture  EXAM: DG C-ARM GT 120 MIN; THORACOLUMBAR SPINE - 2 VIEW  FLUOROSCOPY TIME:  1 min 40 seconds  COMPARISON:  CT abdomen and pelvis 09/28/2013  FINDINGS: Six digital C-arm fluoroscopic images obtained intraoperatively or evaluated.  Marked compression fracture of the L1 vertebral body again identified as noted on prior CT with significant focal kyphosis of the thoracolumbar spine at this level.  Images demonstrate localization of the L1 level and placement of an interbody fusion device.  Due to degree of osseous demineralization, bony landmarks of the compressed L1 vertebral body or difficult to visualize.  There appears to be a  decrease in the degree of focal kyphosis at the L1 level on the image obtained at 1432 hr.  IMPRESSION: Placement of an interbody fusion device at the previously identified marked L1 compression fracture with decrease in degree of focal kyphosis at L1.  Severe osseous demineralization.   Electronically Signed   By: Lavonia Dana M.D.   On: 03/28/2014 15:31    ASSESSMENT / PLAN:  NEUROLOGIC A:   S/p surgical repair of L1 burst fracture. P:   Post-op care, pain control per neurosurgery  PULMONARY A: Lt PTX with chest tube placed by TCTS in OR 7/14. P:   Continue chest tube >> placed order to change to water seal on 7/15. Bronchial hygiene F/u CXR  CARDIOVASCULAR A:  Hx of HTN. Sinus tachycardia >> likely related to pain, PTX. P:  Continue lopressor Monitor hemodynamics  RENAL A:  No acute issues. P:   Monitor renal fx, urine outpt, electrolytes  GASTROINTESTINAL A:   Hx of GERD. P:   Change pepcid to protonix in setting of thrombocytopenia  HEMATOLOGIC A:   Hx of ITP. Leukocytosis. P:  F/u CBC SCD for DVT prevention  INFECTIOUS A:   Fever post-op >> none since. P:   Monitor clinically   ENDOCRINE A:   No acute issues. P:   Monitor blood sugar on BMET  Chesley Mires, MD [place] 03/29/2014, 9:17 AM Pager:  [phone] After 3pm call: [phone]

## 2014-03-29 NOTE — Progress Notes (Signed)
Occupational Therapy Evaluation Patient Details Name: [name] MRN: [id number] DOB: [birth year] Today's Date: 03/29/2014    History of Present Illness 63 yo female with hx HTN, thrombocytopenia, and CKD. To OR 7/14 for repair of L1 burst fracture - L1 fusion. Sustained intraoperative PTX, chest tube placed in OR. To ICU post OP   Clinical Impression   PTA, pt was independent with ADL and mobility. Used TLSO. Currently pt primarily limited by pain from apparent surgery and chest tube sites. Pt should progress well and be able to D/C home with intermittent S. Rec follow up with HHOT as pt will be alone during the day while her daughter is at work. Pt will benefit from skilled OT services to facilitate D/C to next venue due to below deficits.     Follow Up Recommendations  Home health OT;Supervision - Intermittent    Equipment Recommendations  3 in 1 bedside comode    Recommendations for Other Services       Precautions / Restrictions Precautions Precautions: Back (chest tube in place) Precaution Booklet Issued: Yes (comment) Precaution Comments: educated verbally on precautions Restrictions Weight Bearing Restrictions: No      Mobility Bed Mobility Overal bed mobility: +2 for physical assistance;Needs Assistance Bed Mobility: Rolling;Sidelying to Sit Rolling: Mod assist Sidelying to sit: Mod assist;+2 for physical assistance       General bed mobility comments: limited primarily by pain  Transfers Overall transfer level: Needs assistance   Transfers: Sit to/from Stand;Stand Pivot Transfers Sit to Stand: Min assist;+2 safety/equipment Stand pivot transfers: Min assist;+2 safety/equipment       General transfer comment: vc for hand placement and tofollow back precautions    Balance Overall balance assessment: Needs assistance Sitting-balance support: Feet supported;No upper extremity supported Sitting balance-Leahy Scale: Fair     Standing balance support:  During functional activity;Bilateral upper extremity supported Standing balance-Leahy Scale: Fair                              ADL Overall ADL's : Needs assistance/impaired     Grooming: Set up;Supervision/safety   Upper Body Bathing: Minimal assitance;Sitting   Lower Body Bathing: Maximal assistance;Sit to/from stand   Upper Body Dressing : Minimal assistance;Sitting   Lower Body Dressing: Maximal assistance;Sit to/from stand   Toilet Transfer: Minimal assistance (simulated)   Toileting- Clothing Manipulation and Hygiene: Maximal assistance (foley)       Functional mobility during ADLs: +2 for safety/equipment;Minimal assistance General ADL Comments: Began education on back precautions. Handout given.     Vision                     Perception     Praxis      Pertinent Vitals/Pain desat to @ 90-91 after ambulation. C/o L chest pain;chest tube site - nsg aware     Hand Dominance Right   Extremity/Trunk Assessment Upper Extremity Assessment Upper Extremity Assessment: Overall WFL for tasks assessed   Lower Extremity Assessment Lower Extremity Assessment: Overall WFL for tasks assessed   Cervical / Trunk Assessment Cervical / Trunk Assessment: Normal   Communication Communication Communication: No difficulties (wears glasses all the time)   Cognition Arousal/Alertness: Awake/alert Behavior During Therapy: WFL for tasks assessed/performed Overall Cognitive Status: Within Functional Limits for tasks assessed                     General Comments  Exercises       Shoulder Instructions      Home Living Family/patient expects to be discharged to:: Private residence Living Arrangements: Children (daughter currently living with patient) Available Help at Discharge: Family;Available PRN/intermittently Type of Home: House Home Access: Stairs to enter [employer] of Steps: 3 Entrance Stairs-Rails: Right Home  Layout: One level               Home Equipment: None   Additional Comments: tub shower      Prior Functioning/Environment Level of Independence: Independent        Comments: was in TLSO prior to surgery    OT Diagnosis: Generalized weakness;Acute pain   OT Problem List: Decreased strength;Decreased activity tolerance;Decreased safety awareness;Decreased knowledge of use of DME or AE;Decreased knowledge of precautions;Cardiopulmonary status limiting activity;Pain   OT Treatment/Interventions: Self-care/ADL training;Energy conservation;DME and/or AE instruction;Therapeutic activities;Patient/family education    OT Goals(Current goals can be found in the care plan section) Acute Rehab OT Goals Patient Stated Goal: to go home OT Goal Formulation: With patient Time For Goal Achievement: 04/12/14 Potential to Achieve Goals: Good  OT Frequency: Min 3X/week   Barriers to D/C:    daughter works during the day       Co-evaluation              End of Session Equipment Utilized During Treatment: Administrator, arts Communication: Mobility status;Precautions  Activity Tolerance: Patient tolerated treatment well Patient left: in chair;with call bell/phone within reach   Time: 0910-0938 OT Time Calculation (min): 28 min Charges:  OT General Charges $OT Visit: 1 Procedure OT Evaluation $Initial OT Evaluation Tier I: 1 Procedure OT Treatments $Self Care/Home Management : 8-22 mins G-Codes:    Damiyah Ditmars,HILLARY 2014-04-22, 10:52 AM   Maurie Boettcher, OTR/L  [phone] 04-22-2014

## 2014-03-30 ENCOUNTER — Inpatient Hospital Stay (HOSPITAL_COMMUNITY): Payer: BC Managed Care – PPO

## 2014-03-30 DIAGNOSIS — D508 Other iron deficiency anemias: Secondary | ICD-10-CM

## 2014-03-30 DIAGNOSIS — J9383 Other pneumothorax: Secondary | ICD-10-CM

## 2014-03-30 DIAGNOSIS — I10 Essential (primary) hypertension: Secondary | ICD-10-CM

## 2014-03-30 LAB — CBC
HCT: 30.4 % — ABNORMAL LOW (ref 36.0–46.0)
Hemoglobin: 10 g/dL — ABNORMAL LOW (ref 12.0–15.0)
MCH: 28.3 pg (ref 26.0–34.0)
MCHC: 32.9 g/dL (ref 30.0–36.0)
MCV: 86.1 fL (ref 78.0–100.0)
Platelets: 69 10*3/uL — ABNORMAL LOW (ref 150–400)
RBC: 3.53 MIL/uL — ABNORMAL LOW (ref 3.87–5.11)
RDW: 15.8 % — ABNORMAL HIGH (ref 11.5–15.5)
WBC: 12.9 10*3/uL — ABNORMAL HIGH (ref 4.0–10.5)

## 2014-03-30 LAB — BASIC METABOLIC PANEL
Anion gap: 11 (ref 5–15)
BUN: 8 mg/dL (ref 6–23)
CO2: 24 mEq/L (ref 19–32)
Calcium: 7.5 mg/dL — ABNORMAL LOW (ref 8.4–10.5)
Chloride: 105 mEq/L (ref 96–112)
Creatinine, Ser: 0.6 mg/dL (ref 0.50–1.10)
GFR calc Af Amer: >90 mL/min (ref >90)
GFR calc non Af Amer: >90 mL/min (ref >90)
Glucose, Bld: 99 mg/dL (ref 70–99)
Potassium: 4.3 mEq/L (ref 3.7–5.3)
Sodium: 140 mEq/L (ref 137–147)

## 2014-03-30 NOTE — Progress Notes (Signed)
Physical Therapy Treatment Patient Details Name: [name] MRN: [id number] DOB: 09-04-1951 Today's Date: 04/02/14    History of Present Illness 63 yo female with hx HTN, thrombocytopenia, and CKD. To OR 7/14 for repair of L1 burst fracture - L1 fusion. Sustained intraoperative PTX, chest tube placed in OR. To ICU post OP    PT Comments    Patient progressing with ambulation despite increased pain. Good recall of precautions. Will continue with current POC.  Follow Up Recommendations  Home health PT     Equipment Recommendations  Rolling walker with 5" wheels;3in1 (PT)    Recommendations for Other Services       Precautions / Restrictions Precautions Precautions: Back (chest tube in place) Precaution Booklet Issued: Yes (comment) Precaution Comments: educated verbally on precautions Restrictions Weight Bearing Restrictions: No    Mobility  Bed Mobility Overal bed mobility: Needs Assistance Bed Mobility: Rolling;Sit to Sidelying Rolling: Min assist       Sit to sidelying: Mod assist General bed mobility comments: limited primarily by pain  Transfers Overall transfer level: Needs assistance Equipment used: Rolling walker (2 wheeled) Transfers: Sit to/from Stand Sit to Stand: Min assist            Ambulation/Gait Ambulation/Gait assistance: Min guard Ambulation Distance (Feet): 180 Feet Assistive device: Rolling walker (2 wheeled) Gait Pattern/deviations: Step-through pattern;Decreased stride length Gait velocity: decreased   General Gait Details: imporved mobility despite increased pain today   Stairs            Wheelchair Mobility    Modified Rankin (Stroke Patients Only)       Balance     Sitting balance-Leahy Scale: Fair       Standing balance-Leahy Scale: Fair                      Cognition Arousal/Alertness: Awake/alert Behavior During Therapy: WFL for tasks assessed/performed Overall Cognitive Status: Within  Functional Limits for tasks assessed                      Exercises      General Comments        Pertinent Vitals/Pain 8/10    Home Living                      Prior Function            PT Goals (current goals can now be found in the care plan section) Acute Rehab PT Goals Patient Stated Goal: to go home PT Goal Formulation: With patient Time For Goal Achievement: 04/12/14 Potential to Achieve Goals: Good Progress towards PT goals: Progressing toward goals    Frequency  Min 5X/week    PT Plan Current plan remains appropriate    Co-evaluation             End of Session Equipment Utilized During Treatment: Gait belt Activity Tolerance: Patient tolerated treatment well;Patient limited by pain Patient left: in bed;with call bell/phone within reach;with bed alarm set     Time: 9628-3662 PT Time Calculation (min): 18 min  Charges:  $Gait Training: 8-22 mins                    G CodesDuncan Dull 04/02/14, 4:06 PM Alben Deeds, [place] DPT  [phone]

## 2014-03-30 NOTE — Progress Notes (Addendum)
       [address]       [place] [address]             [phone]          2 Days Post-Op Procedure(s) (LRB): Anterolateral decompression of L1 fracture with posterior segmental stabilization (N/A) CHEST TUBE INSERTION (Left)  Subjective: Hurting all over this am. Breathing stable.   Objective: Vital signs in last 24 hours: Patient Vitals for the past 24 hrs:  BP Temp Temp src Pulse Resp SpO2  03/30/14 0734 94/53 mmHg 99 F (37.2 C) Oral 79 15 98 %  03/30/14 0340 106/48 mmHg 98.6 F (37 C) Oral 89 16 98 %  03/29/14 2330 118/53 mmHg 98.7 F (37.1 C) Oral 108 21 91 %  03/29/14 1945 103/37 mmHg 98.3 F (36.8 C) Oral 99 23 93 %  03/29/14 1756 110/50 mmHg - - - - -  03/29/14 1718 - - Oral - - -  03/29/14 1600 96/44 mmHg - - 92 17 92 %  03/29/14 1549 - 99 F (37.2 C) Oral - - -  03/29/14 1500 102/34 mmHg - - 90 15 99 %  03/29/14 1400 105/47 mmHg - - 105 20 98 %  03/29/14 1300 98/43 mmHg - - 92 16 99 %  03/29/14 1200 96/35 mmHg - - 91 14 98 %  03/29/14 1100 93/47 mmHg - - 86 16 98 %  03/29/14 1000 104/46 mmHg - - 89 20 94 %  03/29/14 0900 115/50 mmHg - - 103 19 94 %   Current Weight  03/29/14 168 lb 14 oz (76.6 kg)     Intake/Output from previous day: 07/15 0701 - 07/16 0700 In: 2455 [P.O.:730; I.V.:1725] Out: 2035 [Urine:1970; Chest Tube:65]    PHYSICAL EXAM:  Heart: RRR Lungs: Clear Wound: Dressed and dry Chest tube: No air leak   Lab Results: CBC: Recent Labs  03/29/14 0057 03/30/14 0335  WBC 23.0* 12.9*  HGB 13.3 10.0*  HCT 39.3 30.4*  PLT 80* 69*   BMET:  Recent Labs  03/29/14 0057 03/30/14 0335  NA 138 140  K 5.1 4.3  CL 102 105  CO2 23 24  GLUCOSE 128* 99  BUN 17 8  CREATININE 0.87 0.60  CALCIUM 7.5* 7.5*    PT/INR: No results found for this basename: LABPROT, INR,  in the last 72 hours  CXR: FINDINGS:  Stable cardiomegaly. Left-sided chest tube is unchanged in position.  No pneumothorax is noted. Stable  left perihilar and lower lobe  opacity is noted concerning for atelectasis. Stable right midlung  opacity is noted most consistent with subsegmental atelectasis. No  significant pleural effusion is noted. Bony thorax is intact.  IMPRESSION:  Stable bilateral lung opacities are noted with left greater than  right. Left-sided chest tube remains without evidence of  pneumothorax   Assessment/Plan: S/P Procedure(s) (LRB): Anterolateral decompression of L1 fracture with posterior segmental stabilization (N/A) CHEST TUBE INSERTION (Left) CXR stable, no ptx.  CT with no air leak.   Hopefully can d/c CT today. Stable from thoracic surgery standpoint.   LOS: 2 days    COLLINS,GINA H 03/30/2014  Plan d/c chest tube today I have seen and examined [name] and agree with the above assessment  and plan.  Grace Isaac MD [place] [phone] Office [phone] 03/30/2014 9:25 AM

## 2014-03-30 NOTE — Progress Notes (Signed)
Patient ID: [name], female   DOB: 23-Mar-1951, 63 y.o.   MRN: [id number] Vital signs stable.  Chest tube removed and Ct performed. CT shows good decompression of L1 fx.  Implant in good postion. Mobilizing since CT removal Transfer when stable

## 2014-03-30 NOTE — Care Management Note (Unsigned)
    Page 1 of 1   03/30/2014     11:45:10 AM CARE MANAGEMENT NOTE 03/30/2014  Patient:  [name], [name]   Account Number:  [id number]  Date Initiated:  03/30/2014  Documentation initiated by:  GRAVES-BIGELOW,Kaniah Rizzolo  Subjective/Objective Assessment:   Pt admitted for repair of L1 burst fracture. Sustained intraoperative PTX, chest tube placed in OR. Pt is from home with daughter, however daughter works daytime hours.     Action/Plan:   Per PT/OT recommendations plan is for [facility] services with DME RW and 3n1. CM did speak to pt in regards to [facility] needs and she wanted to discuss it at later time. Will continue to f/u.   Anticipated DC Date:  04/02/2014   Anticipated DC Plan:  [place]  CM consult      Choice offered to / List presented to:             Status of service:  In process, will continue to follow Medicare Important Message given?  NO (If response is "NO", the following Medicare IM given date fields will be blank) Date Medicare IM given:   Medicare IM given by:   Date Additional Medicare IM given:   Additional Medicare IM given by:    Discharge Disposition:    Per UR Regulation:  Reviewed for med. necessity/level of care/duration of stay  If discussed at [place] of Stay Meetings, dates discussed:    Comments:

## 2014-03-30 NOTE — Progress Notes (Signed)
PULMONARY / CRITICAL CARE MEDICINE   Name: [name] MRN: [id number] DOB: 15-Feb-1951    ADMISSION DATE:  03/28/2014 CONSULTATION DATE:  03/28/2014  REFERRING MD :  Ellene Route  CHIEF COMPLAINT:  PTX post NSG procedure.   BRIEF PATIENT DESCRIPTION: 63 yo with HTN, thrombocytopenia, and CKD. To OR 7/14 for repair spinal fracture. Sustained intraoperative PTX, chest tube placed in OR by TCTS. To ICU post OP. PCCM to manage ICU care.    SIGNIFICANT EVENTS: 7/14  Anterior decompression L1 burst fx, L PTX >>> chest tube by TCTS 7/15  Change to water seal 7/16  Signed off to [place]  STUDIES:  LINES / TUBES: Left chest tube 7/14 >>>  CULTURES:  ANTIBIOTICS: Ancef 7/14 x 1  INTERVAL HISTORY: Nauseated this AM.  VITAL SIGNS: Temp:  [98.3 F (36.8 C)-99 F (37.2 C)] 99 F (37.2 C) (07/16 0734) Pulse Rate:  [79-108] 79 (07/16 0734) Resp:  [14-23] 15 (07/16 0734) BP: (93-118)/(34-53) 94/53 mmHg (07/16 0734) SpO2:  [91 %-99 %] 98 % (07/16 0734)  INTAKE / OUTPUT: Intake/Output     07/15 0701 - 07/16 0700 07/16 0701 - 07/17 0700   P.O. 730    I.V. (mL/kg) 1725 (22.5) 115.5 (1.5)   Blood     IV Piggyback     Total Intake(mL/kg) 2455 (32) 115.5 (1.5)   Urine (mL/kg/hr) 1970 (1.1)    Blood     Chest Tube 65 (0)    Total Output 2035 0   Net +420 +115.5         PHYSICAL EXAMINATION: General: Uncomfortable due to nausea / emesis Neuro: Awake, alert, cooperative HEENT: PERRL, moist membranes Cardiovascular: Regular, no murmurs Lungs: Bilateral air entry, few rhonchi, tidal movement without air leak Abdomen:  Soft, non-tender Musculoskeletal: No edema Skin: No rash  LABS:  CBC  Recent Labs Lab 03/28/14 1940 03/29/14 0057 03/30/14 0335  WBC 25.9* 23.0* 12.9*  HGB 15.1* 13.3 10.0*  HCT 43.4 39.3 30.4*  PLT 90* 80* 69*   BMET  Recent Labs Lab 03/28/14 1316 03/29/14 0057 03/30/14 0335  NA 138 138 140  K 4.6 5.1 4.3  CL  --  102 105  CO2  --  23 24  BUN  --   17 8  CREATININE  --  0.87 0.60  GLUCOSE 146* 128* 99   Electrolytes  Recent Labs Lab 03/29/14 0057 03/30/14 0335  CALCIUM 7.5* 7.5*   Cardiac Enzymes  Recent Labs Lab 03/28/14 1940 03/29/14 0057 03/29/14 0825  TROPONINI <0.30 <0.30 <0.30   IMAGING:   Dg Thoracolumbar Spine  03/28/2014   CLINICAL DATA:  Anterolateral decompression and fusion at L1 fracture  EXAM: DG C-ARM GT 120 MIN; THORACOLUMBAR SPINE - 2 VIEW  FLUOROSCOPY TIME:  1 min 40 seconds  COMPARISON:  CT abdomen and pelvis 09/28/2013  FINDINGS: Six digital C-arm fluoroscopic images obtained intraoperatively or evaluated.  Marked compression fracture of the L1 vertebral body again identified as noted on prior CT with significant focal kyphosis of the thoracolumbar spine at this level.  Images demonstrate localization of the L1 level and placement of an interbody fusion device.  Due to degree of osseous demineralization, bony landmarks of the compressed L1 vertebral body or difficult to visualize.  There appears to be a decrease in the degree of focal kyphosis at the L1 level on the image obtained at 1432 hr.  IMPRESSION: Placement of an interbody fusion device at the previously identified marked L1 compression fracture with decrease  in degree of focal kyphosis at L1.  Severe osseous demineralization.   Electronically Signed   By: Lavonia Dana M.D.   On: 03/28/2014 15:31   Dg Chest Port 1 View  03/30/2014   CLINICAL DATA:  Left pneumothorax.  EXAM: PORTABLE CHEST - 1 VIEW  COMPARISON:  March 29, 2014.  FINDINGS: Stable cardiomegaly. Left-sided chest tube is unchanged in position. No pneumothorax is noted. Stable left perihilar and lower lobe opacity is noted concerning for atelectasis. Stable right midlung opacity is noted most consistent with subsegmental atelectasis. No significant pleural effusion is noted. Bony thorax is intact.  IMPRESSION: Stable bilateral lung opacities are noted with left greater than right. Left-sided chest  tube remains without evidence of pneumothorax.   Electronically Signed   By: Sabino Dick M.D.   On: 03/30/2014 07:37   Dg Chest Port 1 View  03/29/2014   CLINICAL DATA:  63 year old female with left pneumothorax. Initial encounter.  EXAM: PORTABLE CHEST - 1 VIEW  COMPARISON:  03/28/2014 and earlier.  FINDINGS: Portable AP semi upright view at 0548 hrs. Extubated. Mildly lower lung volumes. Stable cardiac size and mediastinal contours.  Stable left chest tube. No pneumothorax identified. Mildly increased bilateral platelike perihilar atelectasis. More generalized increased density in the left lung has mildly regressed. No definite pleural effusion.  IMPRESSION: 1. Extubated.  Increased platelike perihilar atelectasis. 2. Stable left chest tube.  No pneumothorax.   Electronically Signed   By: Lars Pinks M.D.   On: 03/29/2014 07:54   Dg Chest Portable 1 View  03/28/2014   CLINICAL DATA:  Status post endotracheal tube placement  EXAM: PORTABLE CHEST - 1 VIEW  COMPARISON:  None  FINDINGS: Cardiac shadow is mildly prominent. Right basilar atelectasis is seen. An endotracheal tube is noted at the level of the carina directed towards a right mainstem bronchus and should be withdrawn 2-3 cm. A left-sided chest tube is seen. Some increased density is noted along the course of the chest tube of uncertain significance. No pneumothorax is seen.  IMPRESSION: Endotracheal tube directed towards the right mainstem bronchus. This should be withdrawn 2-3 cm.  Left chest tube in satisfactory position. No pneumothorax is noted. Considerable density is noted along the course of the tube which may be related atelectasis.  Right basilar atelectasis is noted as well.  Critical Value/emergent results were called by telephone at the time of interpretation on 03/28/2014 at 4:02 pm to [place], the patient's nurse, who verbally acknowledged these results.   Electronically Signed   By: Inez Catalina M.D.   On: 03/28/2014 16:02   Dg C-arm Gt  120 Min  03/28/2014   CLINICAL DATA:  Anterolateral decompression and fusion at L1 fracture  EXAM: DG C-ARM GT 120 MIN; THORACOLUMBAR SPINE - 2 VIEW  FLUOROSCOPY TIME:  1 min 40 seconds  COMPARISON:  CT abdomen and pelvis 09/28/2013  FINDINGS: Six digital C-arm fluoroscopic images obtained intraoperatively or evaluated.  Marked compression fracture of the L1 vertebral body again identified as noted on prior CT with significant focal kyphosis of the thoracolumbar spine at this level.  Images demonstrate localization of the L1 level and placement of an interbody fusion device.  Due to degree of osseous demineralization, bony landmarks of the compressed L1 vertebral body or difficult to visualize.  There appears to be a decrease in the degree of focal kyphosis at the L1 level on the image obtained at 1432 hr.  IMPRESSION: Placement of an interbody fusion device at the previously  identified marked L1 compression fracture with decrease in degree of focal kyphosis at L1.  Severe osseous demineralization.   Electronically Signed   By: Lavonia Dana M.D.   On: 03/28/2014 15:31    ASSESSMENT / PLAN:  S/p surgical repair of L1 burst fracture   Per Neurosurgery  L pneumothorax perioperativelly   Per TCTS  HTN Sinus tachycardia   Lopressor  GERD   Protonix  Avoid Pepcid as thrombocytopenia   Anemia, no overt hemorrhage Thrombocytopenia H/o ITP   SCD  Trend CBC  Nausea / vomiting   Zofran  Abd CXR if persists  PCCM will sign off.  TRH to assume medical management 7/17.  I have personally obtained history, examined patient, evaluated and interpreted laboratory and imaging results, reviewed medical records, formulated assessment / plan and placed orders.  Doree Fudge, MD Pulmonary and [place] Pager: [phone]  03/30/2014, 10:29 AM

## 2014-03-31 ENCOUNTER — Inpatient Hospital Stay (HOSPITAL_COMMUNITY): Payer: BC Managed Care – PPO

## 2014-03-31 ENCOUNTER — Encounter (HOSPITAL_COMMUNITY): Payer: Self-pay | Admitting: Neurological Surgery

## 2014-03-31 DIAGNOSIS — IMO0002 Reserved for concepts with insufficient information to code with codable children: Secondary | ICD-10-CM

## 2014-03-31 DIAGNOSIS — M545 Low back pain, unspecified: Secondary | ICD-10-CM

## 2014-03-31 DIAGNOSIS — F329 Major depressive disorder, single episode, unspecified: Secondary | ICD-10-CM

## 2014-03-31 DIAGNOSIS — F3289 Other specified depressive episodes: Secondary | ICD-10-CM

## 2014-03-31 NOTE — Progress Notes (Signed)
Patient ID: [name], female   DOB: 1951/05/20, 63 y.o.   MRN: [id number] Vital signs are stable. She is having a down day today. Review the CT results with her Overall her motor function appears intact Plan continue mobilizing patient Consider transfer to [facility] tomorrow.

## 2014-03-31 NOTE — Progress Notes (Signed)
[facility] TEAM 1 - Stepdown/ICU TEAM Progress Note  [name] JYN:[id number] DOB: 1951/06/22 DOA: 03/28/2014 PCP: Imelda Pillow, NP  Admit HPI / Brief Narrative: 63 year old WF PMHx depression, ITP, history of nephrolithiasis, dysrhythmia, HTN,. Individual who sustained a spontaneous burst fracture of L1. This occurred nearly a year ago. Her initially planned to do a kyphoplasty however she rapidly progressed to kyphosis and had elements of retropulsed bone causing significant stenosis. Surgical decompression was planned however the patient was found to be thrombocytopenic. This required extensive workup and treatment and at this time the patient's platelet count is above 100,000 soap that she is safe to undergo surgical decompression which will be performed via an anterolateral procedure. She has had significant back pain that is been poorly controlled with high-dose oral narcotic analgesics.    HPI/Subjective: 7/17 states some mild tenderness along left lateral wall (appropriate for recent removal of chest tube), negative CP/SOB. Negative DOE. States has ambulated today without difficulty.  Assessment/Plan: T12, L1 burst fracture -Stabilized by Dr. Kristeen Miss (neurosurgery) with [facility] expandable interbody cage -Patient able to ambulate today without excessive pain, stable for transfer to medical/surgical floor. -Physical therapy/occupational therapy; recommends home health with supervision.  Depression -On admission patient not on any medication for this diagnosis, appears to be well compensated  ITP -Review of Epic shows that patient's platelets are at approximate baseline.  HTN -Within AHA guidelines, continue metoprolol 50 mg daily     Code Status: FULL Family Communication: no family present at time of exam Disposition Plan: Per neurosurgery   Consultants: Dr. Modesto Charon (cardiothoracic surgery) Dr. Kristeen Miss (neurosurgery) Dr. Doree Fudge (PCCM.)   Procedure/Significant Events: 7/14 DG thoracic lumbar spine:Placement of an interbody fusion device at the previously identified  marked L1 compression fracture with decrease in degree of focal kyphosis at L1. 7/14 PCXR; Extubated. Increased platelike perihilar atelectasis. Stable Lt chest tube. No PTHx. 7/14 anterolateral decompression of T12 burst fracture with reconstruction from T11-L2 using nuvasive expandable interbody cage. 7/15 CT L-spine without contrast;  Interval interbody cage placement at L1-2 with reduction of focal Kyphosis.  -Small residual L1 superior endplate fragment protruding into the ventral spinal canal.  - Mild compression deformities T11, T12, and L3-L5 vertebral bodies, unchanged from recent thoracolumbar radiographs.  -Small bilateral pleural effusions Lt > Rt lower lobe lung opacities, incompletely visualized. Left lower lobe pneumonia not excluded.     Culture NA  Antibiotics: NA  DVT prophylaxis: SCD   Devices NA   LINES / TUBES:  7/14 18ga left wrist    Continuous Infusions: . sodium chloride      Objective: VITAL SIGNS: Temp: 98.7 F (37.1 C) (07/17 1144) Temp src: Oral (07/17 1144) BP: 132/66 mmHg (07/17 0700) Pulse Rate: 78 (07/17 0700) SPO2; FIO2:   Intake/Output Summary (Last 24 hours) at 03/31/14 1329 Last data filed at 03/31/14 1228  Gross per 24 hour  Intake 1402.5 ml  Output   1125 ml  Net  277.5 ml     Exam: General: A./O. x4, sitting in chair comfortably, negative CP/SOB, No acute respiratory distress Lungs: Clear to auscultation bilaterally without wheezes or crackles, dressing in place on left lateral wall appropriately tender, Cardiovascular: Regular rate and rhythm without murmur gallop or rub normal S1 and S2 Abdomen: Nontender, nondistended, soft, bowel sounds positive, no rebound, no ascites, no appreciable mass Extremities: No significant cyanosis, clubbing, or edema bilateral  lower extremities  Data Reviewed: Basic Metabolic Panel:  Recent Labs Lab 03/28/14  1316 03/29/14 0057 03/30/14 0335  NA 138 138 140  K 4.6 5.1 4.3  CL  --  102 105  CO2  --  23 24  GLUCOSE 146* 128* 99  BUN  --  17 8  CREATININE  --  0.87 0.60  CALCIUM  --  7.5* 7.5*   Liver Function Tests: No results found for this basename: AST, ALT, ALKPHOS, BILITOT, PROT, ALBUMIN,  in the last 168 hours No results found for this basename: LIPASE, AMYLASE,  in the last 168 hours No results found for this basename: AMMONIA,  in the last 168 hours CBC:  Recent Labs Lab 03/27/14 1308 03/28/14 1316 03/28/14 1630 03/28/14 1940 03/29/14 0057 03/30/14 0335  WBC 10.6*  --  26.9* 25.9* 23.0* 12.9*  NEUTROABS 8.0*  --   --   --   --   --   HGB 13.9 8.2* 15.6* 15.1* 13.3 10.0*  HCT 40.8 24.0* 45.9 43.4 39.3 30.4*  MCV 85.4  --  87.8 85.3 85.6 86.1  PLT 106*  --  110* 90* 80* 69*   Cardiac Enzymes:  Recent Labs Lab 03/28/14 1940 03/29/14 0057 03/29/14 0825  TROPONINI <0.30 <0.30 <0.30   BNP (last 3 results) No results found for this basename: PROBNP,  in the last 8760 hours CBG: No results found for this basename: GLUCAP,  in the last 168 hours  No results found for this or any previous visit (from the past 240 hour(s)).   Studies:  Recent x-ray studies have been reviewed in detail by the Attending Physician  Scheduled Meds:  Scheduled Meds: . docusate sodium  100 mg Oral BID  . ketorolac  15 mg Intravenous 4 times per day  . metoprolol succinate  50 mg Oral QPC supper  . pantoprazole  40 mg Oral Q1200  . senna  1 tablet Oral BID  . sodium chloride  3 mL Intravenous Q12H    Time spent on care of this patient: 40 mins   Allie Bossier , MD   [facility] Office  [phone] Pager - [phone]  On-Call/Text Page:      [email or web address]      password [email or web address]  If 7PM-7AM, please contact night-coverage [email or web address] Password [email or web address] 03/31/2014, 1:29 PM   LOS: 3  days

## 2014-03-31 NOTE — Progress Notes (Addendum)
TCTS DAILY ICU PROGRESS NOTE                   [address]            [place] [address]          [phone]   3 Days Post-Op Procedure(s) (LRB): Anterolateral decompression of L1 fracture with posterior segmental stabilization (N/A) CHEST TUBE INSERTION (Left)  Total Length of Stay:  LOS: 3 days   Subjective: Feels ok  Objective: Vital signs in last 24 hours: Temp:  [98.3 F (36.8 C)-99.5 F (37.5 C)] 99 F (37.2 C) (07/17 0700) Pulse Rate:  [78-103] 78 (07/17 0420) Cardiac Rhythm:  [-] Normal sinus rhythm (07/16 2000) Resp:  [17-22] 19 (07/17 0420) BP: (115-140)/(53-61) 130/61 mmHg (07/17 0420) SpO2:  [91 %-100 %] 100 % (07/17 0420)  Filed Weights   03/28/14 0705 03/29/14 0600  Weight: 160 lb (72.576 kg) 168 lb 14 oz (76.6 kg)    Weight change:    Hemodynamic parameters for last 24 hours:    Intake/Output from previous day: 07/16 0701 - 07/17 0700 In: 1278 [P.O.:320; I.V.:958] Out: 950 [Urine:950]  Intake/Output this shift:    Current Meds: Scheduled Meds: . docusate sodium  100 mg Oral BID  . ketorolac  15 mg Intravenous 4 times per day  . metoprolol succinate  50 mg Oral QPC supper  . pantoprazole  40 mg Oral Q1200  . senna  1 tablet Oral BID  . sodium chloride  3 mL Intravenous Q12H   Continuous Infusions: . sodium chloride     PRN Meds:.acetaminophen, acetaminophen, alum & mag hydroxide-simeth, bisacodyl, menthol-cetylpyridinium, methocarbamol (ROBAXIN) IV, methocarbamol, morphine, morphine injection, ondansetron (ZOFRAN) IV, oxyCODONE-acetaminophen, phenol, polyethylene glycol, sodium chloride  General appearance: alert, cooperative and no distress Heart: regular rate and rhythm Lungs: dim in left>right base  Lab Results: CBC: Recent Labs  03/29/14 0057 03/30/14 0335  WBC 23.0* 12.9*  HGB 13.3 10.0*  HCT 39.3 30.4*  PLT 80* 69*   BMET:  Recent Labs  03/29/14 0057 03/30/14 0335  NA 138 140  K 5.1 4.3  CL 102  105  CO2 23 24  GLUCOSE 128* 99  BUN 17 8  CREATININE 0.87 0.60  CALCIUM 7.5* 7.5*    PT/INR: No results found for this basename: LABPROT, INR,  in the last 72 hours Radiology: Ct Lumbar Spine Wo Contrast  03/30/2014   CLINICAL DATA:  L1 fracture.  EXAM: CT LUMBAR SPINE WITHOUT CONTRAST  TECHNIQUE: Multidetector CT imaging of the lumbar spine was performed without intravenous contrast administration. Multiplanar CT image reconstructions were also generated.  COMPARISON:  CT abdomen and pelvis 09/28/2013. Lumbar spine MRI 06/14/2013. Thoracolumbar radiographs 02/22/2014.  FINDINGS: Severe L1 compression fracture is again identified. There has been interval placement of an expandable interbody cage which extends from the inferior T12 to inferior L2 endplates. Focal kyphosis at this level has decreased. A small portion of the L1 superior endplate remains and protrudes approximately 6 mm into the spinal canal, with the remainder of the L1 vertebral body being largely resected in the interim. There has been interval depression of the superior L2 endplate. Very mild anterior height loss of the T11 vertebral body is stable to slightly increased from the prior CT but unchanged from more recent radiographs. There is mild height loss of the L3-L5 vertebral bodies, new from the prior CT but unchanged from more recent thoracolumbar radiographs.  There is mild thoracolumbar levoscoliosis. There is  mild residual spinal stenosis at T12-L1 related to a small amount of residual superior endplate protruding into the canal. There is very mild spinal stenosis at L2-3 related to slight retropulsion of the L2 superior endplate. Mild atherosclerotic aortic calcification is noted. There are small bilateral pleural effusions as well as patchy right lower lobe and confluent left lower lobe parenchymal lung opacities, incompletely visualized.  IMPRESSION: 1. Interval interbody cage placement at L1-2 with reduction of focal kyphosis  at this level. Mild residual spinal stenosis related to a small residual L1 superior endplate fragment protruding into the ventral spinal canal. 2. Mild compression deformities of the T11, T12, and L3-L5 vertebral bodies, unchanged from recent thoracolumbar radiographs. 3. Small bilateral pleural effusions and left greater than right lower lobe lung opacities, incompletely visualized. Left lower lobe pneumonia not excluded.   Electronically Signed   By: Logan Bores   On: 03/30/2014 17:27   Dg Chest Port 1 View  03/30/2014   CLINICAL DATA:  Left pneumothorax.  EXAM: PORTABLE CHEST - 1 VIEW  COMPARISON:  March 29, 2014.  FINDINGS: Stable cardiomegaly. Left-sided chest tube is unchanged in position. No pneumothorax is noted. Stable left perihilar and lower lobe opacity is noted concerning for atelectasis. Stable right midlung opacity is noted most consistent with subsegmental atelectasis. No significant pleural effusion is noted. Bony thorax is intact.  IMPRESSION: Stable bilateral lung opacities are noted with left greater than right. Left-sided chest tube remains without evidence of pneumothorax.   Electronically Signed   By: Sabino Dick M.D.   On: 03/30/2014 07:37     Assessment/Plan: S/P Procedure(s) (LRB): Anterolateral decompression of L1 fracture with posterior segmental stabilization (N/A) CHEST TUBE INSERTION (Left)   1 hemodyn stable 2 no new labs 3 CXR without pntx with tube out 4push pulm toilet- IS /rehab as able    GOLD,WAYNE E 03/31/2014 8:00 AM  Patient seen and examined, agree with above

## 2014-03-31 NOTE — Progress Notes (Signed)
Physical Therapy Treatment Patient Details Name: [name] MRN: [id number] DOB: Jan 26, 1951 Today's Date: April 12, 2014    History of Present Illness 63 yo female with hx HTN, thrombocytopenia, and CKD. To OR 7/14 for repair of L1 burst fracture - L1 fusion. Sustained intraoperative PTX, chest tube placed in OR. To ICU post OP    PT Comments    Patient making gains with mobility and gait today.  Follow Up Recommendations  Home health PT     Equipment Recommendations  Rolling walker with 5" wheels;3in1 (PT)    Recommendations for Other Services       Precautions / Restrictions Precautions Precautions: Back Precaution Comments: reviewed precautions Restrictions Weight Bearing Restrictions: No    Mobility  Bed Mobility                  Transfers Overall transfer level: Needs assistance Equipment used: Rolling walker (2 wheeled) Transfers: Sit to/from Stand Sit to Stand: Min guard         General transfer comment: Verbal cues for hand placement and technique.  Assist for balance/safety.  Ambulation/Gait Ambulation/Gait assistance: Min guard Ambulation Distance (Feet): 250 Feet Assistive device: Rolling walker (2 wheeled) Gait Pattern/deviations: Step-through pattern;Decreased stride length Gait velocity: decreased Gait velocity interpretation: Below normal speed for age/gender General Gait Details: Cues for upright posture.  Safe gait speed.   Stairs            Wheelchair Mobility    Modified Rankin (Stroke Patients Only)       Balance                                    Cognition Arousal/Alertness: Awake/alert Behavior During Therapy: WFL for tasks assessed/performed Overall Cognitive Status: Within Functional Limits for tasks assessed                      Exercises      General Comments        Pertinent Vitals/Pain Pain 6/10 with mobility    Home Living                      Prior Function             PT Goals (current goals can now be found in the care plan section) Progress towards PT goals: Progressing toward goals    Frequency  Min 5X/week    PT Plan Current plan remains appropriate    Co-evaluation             End of Session Equipment Utilized During Treatment: Gait belt Activity Tolerance: Patient tolerated treatment well Patient left: in chair;with call bell/phone within reach;with family/visitor present     Time: 1191-4782 PT Time Calculation (min): 23 min  Charges:  $Gait Training: 23-37 mins                    G Codes:      Despina Pole 2014-04-12, 7:25 PM Carita Pian. Sanjuana Kava, [place] Pager [phone]

## 2014-03-31 NOTE — Progress Notes (Signed)
Occupational Therapy Note  Pt limited by pain this date - RN made aware.  Discussed back precautions, and AE options for home  Pain 9/10 back.   03/30/14 1530  OT Visit Information  Last OT Received On 03/31/14  Assistance Needed +1  History of Present Illness 63 yo female with hx HTN, thrombocytopenia, and CKD. To OR 7/14 for repair of L1 burst fracture - L1 fusion. Sustained intraoperative PTX, chest tube placed in OR. To ICU post OP  OT Time Calculation  OT Start Time 1504  OT Stop Time 1520  OT Time Calculation (min) 16 min  Precautions  Precautions Back  Precaution Booklet Issued Yes (comment)  Precaution Comments Pt able to recall 2/3 precautions with cuing  Cognition  Arousal/Alertness Awake/alert  Behavior During Therapy [facility] for tasks assessed/performed  Overall Cognitive Status Within Functional Limits for tasks assessed  ADL  Toilet Transfer Min guard;Ambulation;Comfort height toilet;Grab bars  Toileting- Clothing Manipulation and Hygiene Moderate assistance;Sit to/from stand  Functional mobility during ADLs Min guard;Rolling walker  General ADL Comments Discussed AE with pt and alternative methods for ADLs, but pt with increased pain and unable to tolerate sitting to practice.  RN notified.  Balance  Sitting balance-Leahy Scale Fair  Standing balance-Leahy Scale Fair  Transfers  Overall transfer level Needs assistance  Equipment used Rolling walker (2 wheeled)  Transfers Sit to/from [employer] Transfers  Sit to Stand Min guard  Stand pivot transfers Min guard  OT - End of Session  Equipment Utilized During Treatment Rolling walker  Activity Tolerance Patient limited by pain  Patient left Other (comment) (with PT)  Nurse Communication Patient requests pain meds  OT Assessment/Plan  OT Plan Discharge plan remains appropriate  OT Frequency Min 3X/week  Follow Up Recommendations Home health OT;Supervision - Intermittent  OT Equipment 3 in 1 bedside comode   OT Goal Progression  Progress towards OT goals Not progressing toward goals - comment (pain)  ADL Goals  Pt Will Perform Lower Body Bathing with set-up;with supervision;with adaptive equipment;sit to/from stand  Pt Will Perform Lower Body Dressing with set-up;with supervision;sit to/from stand;with adaptive equipment  Pt Will Transfer to Toilet with modified independence;bedside commode;ambulating  Pt Will Perform Toileting - Clothing Manipulation and hygiene with modified independence;sit to/from stand  Additional ADL Goal #1 Pt will independently state 3/3 back precautions for ADL  [place], OTR/L [phone]

## 2014-03-31 NOTE — Progress Notes (Signed)
Pt transferred to 4N22 per MD order. Report called to receiving nurse and all questions answered.

## 2014-04-01 NOTE — Progress Notes (Signed)
Patient ID: [name], female   DOB: 1951/03/07, 63 y.o.   MRN: [id number] Vss.  Motor function ok  Ambulating slightly better. Still needs some help

## 2014-04-01 NOTE — Progress Notes (Signed)
Physical Therapy Treatment Patient Details Name: [name] MRN: [id number] DOB: 24-Apr-1951 Today's Date: 04/01/2014    History of Present Illness 63 yo female with hx HTN, thrombocytopenia, and CKD. To OR 7/14 for repair of L1 burst fracture - L1 fusion. Sustained intraoperative PTX, chest tube placed in OR. To ICU post OP    PT Comments    Pt very pleasant & willing to participate in PT session.  Ambulated from room<>rehab gym & completed stair training.    Follow Up Recommendations  Home health PT     Equipment Recommendations  Rolling walker with 5" wheels;3in1 (PT)    Recommendations for Other Services       Precautions / Restrictions Precautions Precautions: Back Precaution Comments: reviewed precautions Restrictions Weight Bearing Restrictions: No    Mobility  Bed Mobility               General bed mobility comments: in recliner upon arrival  Transfers Overall transfer level: Needs assistance Equipment used: Rolling walker (2 wheeled) Transfers: Sit to/from Stand Sit to Stand: Min guard Stand pivot transfers: Min guard       General transfer comment: cues for hand placement  Ambulation/Gait Ambulation/Gait assistance: Supervision Ambulation Distance (Feet): 240 Feet (120' x 2) Assistive device: Rolling walker (2 wheeled) Gait Pattern/deviations: Step-through pattern Gait velocity: decreased   General Gait Details: cues to relax UE's.  RW height lowered & with improved shoulder relaxation noted.     Stairs Stairs: Yes Stairs assistance: Min guard Stair Management: Two rails;Step to pattern;Forwards Number of Stairs: 3 General stair comments: cues for technique.   Wheelchair Mobility    Modified Rankin (Stroke Patients Only)       Balance                                    Cognition Arousal/Alertness: Awake/alert Behavior During Therapy: WFL for tasks assessed/performed Overall Cognitive Status: Within  Functional Limits for tasks assessed                      Exercises      General Comments        Pertinent Vitals/Pain Reports appropriate incisional pain.      Home Living                      Prior Function            PT Goals (current goals can now be found in the care plan section) Acute Rehab PT Goals PT Goal Formulation: With patient Time For Goal Achievement: 04/12/14 Potential to Achieve Goals: Good Progress towards PT goals: Progressing toward goals    Frequency  Min 5X/week    PT Plan Current plan remains appropriate    Co-evaluation PT/OT/SLP Co-Evaluation/Treatment: Yes           End of Session Equipment Utilized During Treatment: Gait belt Activity Tolerance: Patient tolerated treatment well Patient left: in chair;with call bell/phone within reach;with chair alarm set     Time: 5009-3818 PT Time Calculation (min): 26 min  Charges:  $Gait Training: 8-22 mins                    G Codes:      Sena Hitch 04/01/2014, 1:55 PM  Sarajane Marek, PTA [phone] 04/01/2014

## 2014-04-01 NOTE — Progress Notes (Signed)
Occupational Therapy Treatment Patient Details Name: [name] MRN: [id number] DOB: Sep 24, 1950 Today's Date: 04/01/2014    History of present illness 63 yo female with hx HTN, thrombocytopenia, and CKD. To OR 7/14 for repair of L1 burst fracture - L1 fusion. Sustained intraoperative PTX, chest tube placed in OR. To ICU post OP   OT comments  Agreeable to participation in skilled OT.  Able to return demo of use of reacher and reports having one at home.  Safe return demo of tub transfer.  Moving well!  Follow Up Recommendations  Home health OT;Supervision - Intermittent    Equipment Recommendations  3 in 1 bedside comode          Precautions / Restrictions Precautions Precautions: Back Precaution Comments: reviewed precautions Restrictions Weight Bearing Restrictions: No       Mobility Bed Mobility               General bed mobility comments: in recliner upon arrival  Transfers Overall transfer level: Needs assistance Equipment used: Rolling walker (2 wheeled) Transfers: Sit to/from Stand;Stand Pivot Transfers Sit to Stand: Min guard Stand pivot transfers: Min guard                                               ADL Overall ADL's : Needs assistance/impaired             Lower Body Bathing: Minimal assistance Lower Body Bathing Details (indicate cue type and reason): simulated with use of A/E     Lower Body Dressing: Supervision/safety;Sit to/from stand Lower Body Dressing Details (indicate cue type and reason): able to don pants with use of reacher  Toilet Transfer: Min guard;Ambulation Toilet Transfer Details (indicate cue type and reason): simulated with transfers during session   Toileting - Clothing Manipulation Details (indicate cue type and reason): min guard, simulated with transfers and functional movements during session Tub/ Shower Transfer: Tub transfer;Minimal assistance;Cueing for sequencing;Ambulation Tub/Shower  Transfer Details (indicate cue type and reason): will have her daughter at home with her nights, rec. bathing at night when dtr. available to assist with transfer in/out of tub Functional mobility during ADLs: Min guard General ADL Comments: reviewed a/e, pt. has reacher not sure she wants to purchase the kit.  able to complete safe tub transfer in/out, reviewed having dtr. with her during transfer                                                                                                    Pertinent Vitals/ Pain       Pain meds prior to arrival, pt. Reports "pain easing off" upon our arrival  Frequency Min 3X/week     Progress Toward Goals  OT Goals(current goals can now be found in the care plan section)  Progress towards OT goals: Progressing toward goals     Plan Discharge plan remains appropriate                     End of Session Equipment Utilized During Treatment: Rolling walker;Back brace   Activity Tolerance Patient tolerated treatment well   Patient Left in chair;with call bell/phone within reach             Time: 0855-0920 OT Time Calculation (min): 25 min  Charges: OT General Charges $OT Visit: 1 Procedure OT Treatments $Self Care/Home Management : 8-22 mins (co-tx with PT)  Janice Coffin, COTA/L 04/01/2014, 11:36 AM

## 2014-04-01 NOTE — Progress Notes (Signed)
[facility]                                                                              Patient Demographics  [name], is a 63 y.o. female, DOB - 26-Jun-1951, KDX:[id number]  Admit date - 03/28/2014   Admitting Physician Kristeen Miss, MD  Outpatient Primary MD for the patient is [name], [name], NP  LOS - 4   No chief complaint on file.     Admit HPI/Interim history 63 year old female with history of depression, ITP, history of nephrolithiasis, dysrhythmia, HTN,.  Individual who sustained a spontaneous burst fracture of L1. This occurred nearly a year ago. She was initially planned to do a kyphoplasty however she rapidly progressed to kyphosis and had elements of retropulsed bone causing significant stenosis. Surgical decompression was planned however the patient was found to be thrombocytopenic.  Patient underwent reconstruction from T11-L2 using nuvasive expandable interbody cage. She has been evaluated by PT/OT and will require home health.  Patient seen walking in the halls today.  From a medical standpoint, patient is stable for discharge.    Assessment & Plan  T12, L1 burst fracture  -Stabilized by Dr. Kristeen Miss (neurosurgery) with [facility] expandable interbody cage  -Patient able to ambulate today without excessive pain -PT/OT recommends home health with supervision.   Depression  -On no home medications -Patient states she is just tired of the back pain.   ITP  -platelets appear to be at baseline  Hypertension -Stable, continue metoprolol   Constipation -Continue colace/senokot, enema if needed -Secondary to pain medication  Nausea -Continue antiemetics -Secondary to pain medication  Code Status: Full  Family Communication: None at bedside  Disposition Plan: Admitted.  Medically stable for discharge to home with home health.    Time Spent in minutes   30 minutes  Procedures/Significant Events  7/14 DG thoracic lumbar spine:Placement  of an interbody fusion device at the previously identified  marked L1 compression fracture with decrease in degree of focal kyphosis at L1.  7/14 PCXR; Extubated. Increased platelike perihilar atelectasis. Stable Lt chest tube. No PTHx.  7/14 anterolateral decompression of T12 burst fracture with reconstruction from T11-L2 using nuvasive expandable interbody cage.  7/15 CT L-spine without contrast; Interval interbody cage placement at L1-2 with reduction of focal Kyphosis.  -Small residual L1 superior endplate fragment protruding into the ventral spinal canal.  - Mild compression deformities T11, T12, and L3-L5 vertebral bodies, unchanged from recent thoracolumbar radiographs.  -Small bilateral pleural effusions Lt > Rt lower lobe lung opacities, incompletely visualized. Left lower lobe pneumonia not excluded.  Consults   PCCM -->[facility] Cardiothoracic surgery   DVT Prophylaxis  SCDs  Lab Results  Component Value Date   PLT 69* 03/30/2014    Medications  Scheduled Meds: . docusate sodium  100 mg Oral BID  . ketorolac  15 mg Intravenous 4 times per day  . metoprolol succinate  50 mg Oral QPC supper  . pantoprazole  40 mg Oral Q1200  . senna  1 tablet Oral BID  . sodium chloride  3 mL Intravenous Q12H   Continuous Infusions: . sodium chloride     PRN Meds:.acetaminophen, acetaminophen, alum & mag hydroxide-simeth,  bisacodyl, menthol-cetylpyridinium, methocarbamol (ROBAXIN) IV, methocarbamol, morphine, morphine injection, ondansetron (ZOFRAN) IV, oxyCODONE-acetaminophen, phenol, polyethylene glycol, sodium chloride  Antibiotics    Anti-infectives   Start     Dose/Rate Route Frequency Ordered Stop   03/28/14 2000  ceFAZolin (ANCEF) IVPB 1 g/50 mL premix     1 g 100 mL/hr over 30 Minutes Intravenous Every 8 hours 03/28/14 1838 03/29/14 0530   03/28/14 1515  ceFAZolin (ANCEF) IVPB 2 g/50 mL premix     2 g 100 mL/hr over 30 Minutes Intravenous  Once 03/28/14 1503 03/28/14 1430    03/28/14 1104  bacitracin 50,000 Units in sodium chloride irrigation 0.9 % 500 mL irrigation  Status:  Discontinued       As needed 03/28/14 1128 03/28/14 1615   03/28/14 0600  ceFAZolin (ANCEF) IVPB 2 g/50 mL premix     2 g 100 mL/hr over 30 Minutes Intravenous On call to O.R. 03/27/14 1422 03/28/14 1025        Subjective:   [name] seen and examined today.  Patient admits to back pain, but states she is feeling better.  She denies headache, dizziness, chest pain, shortness of breath. She does complain of nausea and some constipation.   Objective:   Filed Vitals:   03/31/14 1900 03/31/14 2200 04/01/14 0510 04/01/14 0958  BP: 173/69 160/71 142/80 146/71  Pulse: 85 79 79 78  Temp: 98.4 F (36.9 C) 98.3 F (36.8 C) 98.9 F (37.2 C) 98.9 F (37.2 C)  TempSrc: Oral Oral Oral Oral  Resp: 18 18 20 18   Height:      Weight:      SpO2: 96% 94% 99% 91%    Wt Readings from Last 3 Encounters:  03/29/14 76.6 kg (168 lb 14 oz)  03/29/14 76.6 kg (168 lb 14 oz)  03/20/14 72.757 kg (160 lb 6.4 oz)     Intake/Output Summary (Last 24 hours) at 04/01/14 1159 Last data filed at 04/01/14 0935  Gross per 24 hour  Intake    240 ml  Output    925 ml  Net   -685 ml    Exam  General: Well developed, well nourished, NAD, appears stated age, wearing cage  HEENT: NCAT, PERRLA, EOMI, Anicteic Sclera, mucous membranes moist.   Neck: Supple, no JVD, no masses  Cardiovascular: S1 S2 auscultated, no rubs, murmurs or gallops. Regular rate and rhythm.  Respiratory: Clear to auscultation bilaterally with equal chest rise  Abdomen: Soft, nontender, nondistended, + bowel sounds  Extremities: warm dry without cyanosis clubbing or edema  Neuro: AAOx3, cranial nerves grossly intact. Gait normal.  No focal deficits.    Skin: Without rashes exudates or nodules  Psych: Depressed affect, intact judgement and insight    Data Review   Micro Results No results found for this or any  previous visit (from the past 240 hour(s)).  Radiology Reports Dg Chest 2 View  03/31/2014   CLINICAL DATA:  Left-sided pneumothorax.  Post chest tube placement  EXAM: CHEST  2 VIEW  COMPARISON:  03/30/2014; 03/28/2014  FINDINGS: Grossly unchanged enlarged cardiac silhouette and mediastinal contours. Interval removal of left-sided chest tube without development of a pneumothorax. Overall improved aeration of the lungs with persistent bilateral mid and bilateral medial basilar heterogeneous opacities, left greater than right. Unchanged trace bilateral effusions. Unchanged bones including upper lumbar vertebral body replacement, incompletely evaluated  IMPRESSION: 1. Interval removal of left-sided chest tube without development of a pneumothorax. 2. Improved aeration of lungs with persistent bibasilar  opacities, left greater than right, atelectasis versus infiltrate/contusion.   Electronically Signed   By: Sandi Mariscal M.D.   On: 03/31/2014 08:10   Dg Thoracolumbar Spine  03/28/2014   CLINICAL DATA:  Anterolateral decompression and fusion at L1 fracture  EXAM: DG C-ARM GT 120 MIN; THORACOLUMBAR SPINE - 2 VIEW  FLUOROSCOPY TIME:  1 min 40 seconds  COMPARISON:  CT abdomen and pelvis 09/28/2013  FINDINGS: Six digital C-arm fluoroscopic images obtained intraoperatively or evaluated.  Marked compression fracture of the L1 vertebral body again identified as noted on prior CT with significant focal kyphosis of the thoracolumbar spine at this level.  Images demonstrate localization of the L1 level and placement of an interbody fusion device.  Due to degree of osseous demineralization, bony landmarks of the compressed L1 vertebral body or difficult to visualize.  There appears to be a decrease in the degree of focal kyphosis at the L1 level on the image obtained at 1432 hr.  IMPRESSION: Placement of an interbody fusion device at the previously identified marked L1 compression fracture with decrease in degree of focal  kyphosis at L1.  Severe osseous demineralization.   Electronically Signed   By: Lavonia Dana M.D.   On: 03/28/2014 15:31   Ct Lumbar Spine Wo Contrast  03/30/2014   CLINICAL DATA:  L1 fracture.  EXAM: CT LUMBAR SPINE WITHOUT CONTRAST  TECHNIQUE: Multidetector CT imaging of the lumbar spine was performed without intravenous contrast administration. Multiplanar CT image reconstructions were also generated.  COMPARISON:  CT abdomen and pelvis 09/28/2013. Lumbar spine MRI 06/14/2013. Thoracolumbar radiographs 02/22/2014.  FINDINGS: Severe L1 compression fracture is again identified. There has been interval placement of an expandable interbody cage which extends from the inferior T12 to inferior L2 endplates. Focal kyphosis at this level has decreased. A small portion of the L1 superior endplate remains and protrudes approximately 6 mm into the spinal canal, with the remainder of the L1 vertebral body being largely resected in the interim. There has been interval depression of the superior L2 endplate. Very mild anterior height loss of the T11 vertebral body is stable to slightly increased from the prior CT but unchanged from more recent radiographs. There is mild height loss of the L3-L5 vertebral bodies, new from the prior CT but unchanged from more recent thoracolumbar radiographs.  There is mild thoracolumbar levoscoliosis. There is mild residual spinal stenosis at T12-L1 related to a small amount of residual superior endplate protruding into the canal. There is very mild spinal stenosis at L2-3 related to slight retropulsion of the L2 superior endplate. Mild atherosclerotic aortic calcification is noted. There are small bilateral pleural effusions as well as patchy right lower lobe and confluent left lower lobe parenchymal lung opacities, incompletely visualized.  IMPRESSION: 1. Interval interbody cage placement at L1-2 with reduction of focal kyphosis at this level. Mild residual spinal stenosis related to a  small residual L1 superior endplate fragment protruding into the ventral spinal canal. 2. Mild compression deformities of the T11, T12, and L3-L5 vertebral bodies, unchanged from recent thoracolumbar radiographs. 3. Small bilateral pleural effusions and left greater than right lower lobe lung opacities, incompletely visualized. Left lower lobe pneumonia not excluded.   Electronically Signed   By: Logan Bores   On: 03/30/2014 17:27   Dg Chest Port 1 View  03/30/2014   CLINICAL DATA:  Left pneumothorax.  EXAM: PORTABLE CHEST - 1 VIEW  COMPARISON:  March 29, 2014.  FINDINGS: Stable cardiomegaly. Left-sided chest tube is unchanged  in position. No pneumothorax is noted. Stable left perihilar and lower lobe opacity is noted concerning for atelectasis. Stable right midlung opacity is noted most consistent with subsegmental atelectasis. No significant pleural effusion is noted. Bony thorax is intact.  IMPRESSION: Stable bilateral lung opacities are noted with left greater than right. Left-sided chest tube remains without evidence of pneumothorax.   Electronically Signed   By: Sabino Dick M.D.   On: 03/30/2014 07:37   Dg Chest Port 1 View  03/29/2014   CLINICAL DATA:  63 year old female with left pneumothorax. Initial encounter.  EXAM: PORTABLE CHEST - 1 VIEW  COMPARISON:  03/28/2014 and earlier.  FINDINGS: Portable AP semi upright view at 0548 hrs. Extubated. Mildly lower lung volumes. Stable cardiac size and mediastinal contours.  Stable left chest tube. No pneumothorax identified. Mildly increased bilateral platelike perihilar atelectasis. More generalized increased density in the left lung has mildly regressed. No definite pleural effusion.  IMPRESSION: 1. Extubated.  Increased platelike perihilar atelectasis. 2. Stable left chest tube.  No pneumothorax.   Electronically Signed   By: Lars Pinks M.D.   On: 03/29/2014 07:54   Dg Chest Portable 1 View  03/28/2014   CLINICAL DATA:  Status post endotracheal tube  placement  EXAM: PORTABLE CHEST - 1 VIEW  COMPARISON:  None  FINDINGS: Cardiac shadow is mildly prominent. Right basilar atelectasis is seen. An endotracheal tube is noted at the level of the carina directed towards a right mainstem bronchus and should be withdrawn 2-3 cm. A left-sided chest tube is seen. Some increased density is noted along the course of the chest tube of uncertain significance. No pneumothorax is seen.  IMPRESSION: Endotracheal tube directed towards the right mainstem bronchus. This should be withdrawn 2-3 cm.  Left chest tube in satisfactory position. No pneumothorax is noted. Considerable density is noted along the course of the tube which may be related atelectasis.  Right basilar atelectasis is noted as well.  Critical Value/emergent results were called by telephone at the time of interpretation on 03/28/2014 at 4:02 pm to Juarez, the patient's nurse, who verbally acknowledged these results.   Electronically Signed   By: Inez Catalina M.D.   On: 03/28/2014 16:02   Dg C-arm Gt 120 Min  03/28/2014   CLINICAL DATA:  Anterolateral decompression and fusion at L1 fracture  EXAM: DG C-ARM GT 120 MIN; THORACOLUMBAR SPINE - 2 VIEW  FLUOROSCOPY TIME:  1 min 40 seconds  COMPARISON:  CT abdomen and pelvis 09/28/2013  FINDINGS: Six digital C-arm fluoroscopic images obtained intraoperatively or evaluated.  Marked compression fracture of the L1 vertebral body again identified as noted on prior CT with significant focal kyphosis of the thoracolumbar spine at this level.  Images demonstrate localization of the L1 level and placement of an interbody fusion device.  Due to degree of osseous demineralization, bony landmarks of the compressed L1 vertebral body or difficult to visualize.  There appears to be a decrease in the degree of focal kyphosis at the L1 level on the image obtained at 1432 hr.  IMPRESSION: Placement of an interbody fusion device at the previously identified marked L1 compression fracture  with decrease in degree of focal kyphosis at L1.  Severe osseous demineralization.   Electronically Signed   By: Lavonia Dana M.D.   On: 03/28/2014 15:31    CBC  Recent Labs Lab 03/27/14 1308 03/28/14 1316 03/28/14 1630 03/28/14 1940 03/29/14 0057 03/30/14 0335  WBC 10.6*  --  26.9* 25.9* 23.0* 12.9*  HGB 13.9 8.2* 15.6* 15.1* 13.3 10.0*  HCT 40.8 24.0* 45.9 43.4 39.3 30.4*  PLT 106*  --  110* 90* 80* 69*  MCV 85.4  --  87.8 85.3 85.6 86.1  MCH 29.1  --  29.8 29.7 29.0 28.3  MCHC 34.1  --  34.0 34.8 33.8 32.9  RDW 14.3  --  14.9 15.1 15.6* 15.8*  LYMPHSABS 1.5  --   --   --   --   --   MONOABS 0.6  --   --   --   --   --   EOSABS 0.4  --   --   --   --   --   BASOSABS 0.0  --   --   --   --   --     Chemistries   Recent Labs Lab 03/28/14 1316 03/29/14 0057 03/30/14 0335  NA 138 138 140  K 4.6 5.1 4.3  CL  --  102 105  CO2  --  23 24  GLUCOSE 146* 128* 99  BUN  --  17 8  CREATININE  --  0.87 0.60  CALCIUM  --  7.5* 7.5*   ------------------------------------------------------------------------------------------------------------------ estimated creatinine clearance is 64.4 ml/min (by C-G formula based on Cr of 0.6). ------------------------------------------------------------------------------------------------------------------ No results found for this basename: HGBA1C,  in the last 72 hours ------------------------------------------------------------------------------------------------------------------ No results found for this basename: CHOL, HDL, LDLCALC, TRIG, CHOLHDL, LDLDIRECT,  in the last 72 hours ------------------------------------------------------------------------------------------------------------------ No results found for this basename: TSH, T4TOTAL, FREET3, T3FREE, THYROIDAB,  in the last 72 hours ------------------------------------------------------------------------------------------------------------------ No results found for this basename:  VITAMINB12, FOLATE, FERRITIN, TIBC, IRON, RETICCTPCT,  in the last 72 hours  Coagulation profile No results found for this basename: INR, PROTIME,  in the last 168 hours  No results found for this basename: DDIMER,  in the last 72 hours  Cardiac Enzymes  Recent Labs Lab 03/28/14 1940 03/29/14 0057 03/29/14 0825  TROPONINI <0.30 <0.30 <0.30   ------------------------------------------------------------------------------------------------------------------ No components found with this basename: POCBNP,     Rashana Andrew D.O. on 04/01/2014 at 11:59 AM  Between 7am to 7pm - Pager - [phone]  After 7pm go to [email or web address] - password [email or web address]  And look for the night coverage person covering for me after hours  [facility] Office  [phone]

## 2014-04-02 ENCOUNTER — Inpatient Hospital Stay (HOSPITAL_COMMUNITY): Payer: BC Managed Care – PPO

## 2014-04-02 NOTE — Progress Notes (Signed)
Radiologist called with an x-ray result of the patient. MD's called to notify

## 2014-04-02 NOTE — Progress Notes (Signed)
Physical Therapy Treatment Patient Details Name: [name] MRN: [id number] DOB: [birth year] Today's Date: 04/02/2014    History of Present Illness 63 yo female with hx HTN, thrombocytopenia, and CKD. To OR 7/14 for repair of L1 burst fracture - L1 fusion. Sustained intraoperative PTX, chest tube placed in OR. To ICU post OP    PT Comments    Pt mobilizing at supervision to mod I with therapy. Encouraged to ambulate 2-3x within unit as tolerated. Pt does have some questions for OT regarding self-care. At this time, is safe to ambulate with nursing or family; no further acute PT needs warranted. Pt educated on home safety questions regarding mobility. Will sign off. Thanks.   Follow Up Recommendations  No PT follow up;Supervision - Intermittent     Equipment Recommendations  Rolling walker with 5" wheels;3in1 (PT)    Recommendations for Other Services       Precautions / Restrictions Precautions Precautions: Back Precaution Comments: pt able to recall 3/3 back precautions and able to adhere during session  Required Braces or Orthoses: Spinal Brace Spinal Brace: Other (comment) Spinal Brace Comments: brace in room and has been wearing since october  Restrictions Weight Bearing Restrictions: No    Mobility  Bed Mobility Overal bed mobility: Modified Independent Bed Mobility: Rolling;Sidelying to Sit Rolling: Modified independent (Device/Increase time) Sidelying to sit: Modified independent (Device/Increase time)       General bed mobility comments: HOB flattened to simulate home enviroment; no physical (A) needed; good ability to perform log rolling technique   Transfers Overall transfer level: Modified independent Equipment used: Rolling walker (2 wheeled) Transfers: Sit to/from Stand Sit to Stand: Modified independent (Device/Increase time)         General transfer comment: pt able to transfer at mod I with use of RW and armrests    Ambulation/Gait Ambulation/Gait assistance: Supervision;Modified independent (Device/Increase time) Ambulation Distance (Feet): 300 Feet (15x2) Assistive device: Rolling walker (2 wheeled) Gait Pattern/deviations: Step-through pattern Gait velocity: decreased Gait velocity interpretation: Below normal speed for age/gender General Gait Details: sitting rest break in gym; no LOB noted with mobility; pt reports she feels as though she is walking better than her baseline before surgery; supervision to mod I for safety; encouraged to walk 2-3x daily   Stairs Stairs: Yes Stairs assistance: Supervision Stair Management: Two rails;Step to pattern;Forwards Number of Stairs: 5 General stair comments: supervision for safety only; demo good ability to recall technique and good safety awareness   Wheelchair Mobility    Modified Rankin (Stroke Patients Only)       Balance Overall balance assessment: No apparent balance deficits (not formally assessed)                                  Cognition Arousal/Alertness: Awake/alert Behavior During Therapy: WFL for tasks assessed/performed Overall Cognitive Status: Within Functional Limits for tasks assessed                      Exercises      General Comments        Pertinent Vitals/Pain 6/10; patient repositioned for comfort     Home Living                      Prior Function            PT Goals (current goals can now be found in the care plan section)  Acute Rehab PT Goals Patient Stated Goal: i just want to go home  PT Goal Formulation: With patient Time For Goal Achievement: 04/12/14 Potential to Achieve Goals: Good Progress towards PT goals: Progressing toward goals    Frequency  Min 5X/week    PT Plan Discharge plan needs to be updated    Co-evaluation             End of Session Equipment Utilized During Treatment: Back brace Activity Tolerance: Patient tolerated treatment  well Patient left: in chair;with call bell/phone within reach     Time: 0939-1003 PT Time Calculation (min): 24 min  Charges:  [employer] Training: 23-37 mins                    G CodesGustavus Bryant, Devol 04/02/2014, 12:58 PM

## 2014-04-02 NOTE — Progress Notes (Signed)
[facility]                                                                              Patient Demographics  [name], is a 63 y.o. female, DOB - 1951/07/21, OJJ:[id number]  Admit date - 03/28/2014   Admitting Physician Evelyn Miss, MD  Outpatient Primary MD for the patient is [name], [name], NP  LOS - 5   No chief complaint on file.     Admit HPI/Interim history 63 year old female with history of depression, ITP, history of nephrolithiasis, dysrhythmia, HTN,.  Individual who sustained a spontaneous burst fracture of L1. This occurred nearly a year ago. She was initially planned to do a kyphoplasty however she rapidly progressed to kyphosis and had elements of retropulsed bone causing significant stenosis. Surgical decompression was planned however the patient was found to be thrombocytopenic.  Patient underwent reconstruction from T11-L2 using nuvasive expandable interbody cage. She has been evaluated by PT/OT and will require home health.  Patient seen walking in the halls today.  From a medical standpoint, patient is stable for discharge.    Assessment & Plan  T12, L1 burst fracture  -Stabilized by Dr. Kristeen Maldonado (neurosurgery) with [facility] expandable interbody cage  -Patient able to ambulate today without excessive pain -PT/OT recommends home health with supervision.  -Continues to have back pain, continue pain control   Depression  -On no home medications -Patient states she is just tired of the back pain.  -Will need to follow up with PCP regarding possibly starting medications  ITP  -platelets appear to be at baseline  Hypertension -Stable, continue metoprolol   Constipation -patient was able to have several bowel movements yesterday -Continue colace/senokot, enema if needed -Secondary to pain medication  Nausea -Continue antiemetics -Secondary to pain medication  Code Status: Full  Family Communication: None at bedside  Disposition  Plan: Admitted.  Medically stable for discharge to home with home health.    Time Spent in minutes   30 minutes  Procedures/Significant Events  7/14 DG thoracic lumbar spine:Placement of an interbody fusion device at the previously identified  marked L1 compression fracture with decrease in degree of focal kyphosis at L1.  7/14 PCXR; Extubated. Increased platelike perihilar atelectasis. Stable Lt chest tube. No PTHx.  7/14 anterolateral decompression of T12 burst fracture with reconstruction from T11-L2 using nuvasive expandable interbody cage.  7/15 CT L-spine without contrast; Interval interbody cage placement at L1-2 with reduction of focal Kyphosis.  -Small residual L1 superior endplate fragment protruding into the ventral spinal canal.  - Mild compression deformities T11, T12, and L3-L5 vertebral bodies, unchanged from recent thoracolumbar radiographs.  -Small bilateral pleural effusions Lt > Rt lower lobe lung opacities, incompletely visualized. Left lower lobe pneumonia not excluded.  Consults   PCCM -->[facility] Cardiothoracic surgery   DVT Prophylaxis  SCDs  Lab Results  Component Value Date   PLT 69* 03/30/2014    Medications  Scheduled Meds: . docusate sodium  100 mg Oral BID  . ketorolac  15 mg Intravenous 4 times per day  . metoprolol succinate  50 mg Oral QPC supper  . pantoprazole  40 mg Oral Q1200  . senna  1 tablet  Oral BID  . sodium chloride  3 mL Intravenous Q12H   Continuous Infusions: . sodium chloride     PRN Meds:.acetaminophen, acetaminophen, alum & mag hydroxide-simeth, bisacodyl, menthol-cetylpyridinium, methocarbamol (ROBAXIN) IV, methocarbamol, morphine, morphine injection, ondansetron (ZOFRAN) IV, oxyCODONE-acetaminophen, phenol, polyethylene glycol, sodium chloride  Antibiotics    Anti-infectives   Start     Dose/Rate Route Frequency Ordered Stop   03/28/14 2000  ceFAZolin (ANCEF) IVPB 1 g/50 mL premix     1 g 100 mL/hr over 30 Minutes Intravenous  Every 8 hours 03/28/14 1838 03/29/14 0530   03/28/14 1515  ceFAZolin (ANCEF) IVPB 2 g/50 mL premix     2 g 100 mL/hr over 30 Minutes Intravenous  Once 03/28/14 1503 03/28/14 1430   03/28/14 1104  bacitracin 50,000 Units in sodium chloride irrigation 0.9 % 500 mL irrigation  Status:  Discontinued       As needed 03/28/14 1128 03/28/14 1615   03/28/14 0600  ceFAZolin (ANCEF) IVPB 2 g/50 mL premix     2 g 100 mL/hr over 30 Minutes Intravenous On call to O.R. 03/27/14 1422 03/28/14 1025        Subjective:   [name] seen and examined today.  Patient continues to have back pain and nausea with poor appetite.  She denies vomiting, headache, dizziness, chest pain, shortness of breath.   Objective:   Filed Vitals:   04/01/14 2039 04/02/14 0207 04/02/14 0525 04/02/14 0933  BP: 151/76 165/62 145/61 148/62  Pulse: 79 80 75 72  Temp: 99.1 F (37.3 C) 98.8 F (37.1 C) 97.8 F (36.6 C) 98 F (36.7 C)  TempSrc: Oral Oral Oral Oral  Resp: 18 18 18 18   Height:      Weight:      SpO2: 99% 94% 98% 99%    Wt Readings from Last 3 Encounters:  03/29/14 76.6 kg (168 lb 14 oz)  03/29/14 76.6 kg (168 lb 14 oz)  03/20/14 72.757 kg (160 lb 6.4 oz)     Intake/Output Summary (Last 24 hours) at 04/02/14 1045 Last data filed at 04/01/14 1305  Gross per 24 hour  Intake    240 ml  Output      0 ml  Net    240 ml    Exam  General: Well developed, well nourished, NAD, appears stated age, wearing cage  HEENT: NCAT, ucous membranes moist.   Cardiovascular: S1 S2 auscultated, no rubs, murmurs or gallops. Regular rate and rhythm.  Respiratory: Clear to auscultation bilaterally with equal chest rise  Abdomen: Soft, obese, nontender, nondistended, + bowel sounds  Extremities: warm dry without cyanosis clubbing or edema, SCDs in place  Neuro: AAOx3, No focal deficits  Psych: Depressed affect, intact judgement and insight  Data Review   Micro Results No results found for this or  any previous visit (from the past 240 hour(s)).  Radiology Reports Dg Chest 2 View  03/31/2014   CLINICAL DATA:  Left-sided pneumothorax.  Post chest tube placement  EXAM: CHEST  2 VIEW  COMPARISON:  03/30/2014; 03/28/2014  FINDINGS: Grossly unchanged enlarged cardiac silhouette and mediastinal contours. Interval removal of left-sided chest tube without development of a pneumothorax. Overall improved aeration of the lungs with persistent bilateral mid and bilateral medial basilar heterogeneous opacities, left greater than right. Unchanged trace bilateral effusions. Unchanged bones including upper lumbar vertebral body replacement, incompletely evaluated  IMPRESSION: 1. Interval removal of left-sided chest tube without development of a pneumothorax. 2. Improved aeration of lungs with persistent bibasilar  opacities, left greater than right, atelectasis versus infiltrate/contusion.   Electronically Signed   By: Sandi Mariscal M.D.   On: 03/31/2014 08:10   Dg Thoracolumbar Spine  03/28/2014   CLINICAL DATA:  Anterolateral decompression and fusion at L1 fracture  EXAM: DG C-ARM GT 120 MIN; THORACOLUMBAR SPINE - 2 VIEW  FLUOROSCOPY TIME:  1 min 40 seconds  COMPARISON:  CT abdomen and pelvis 09/28/2013  FINDINGS: Six digital C-arm fluoroscopic images obtained intraoperatively or evaluated.  Marked compression fracture of the L1 vertebral body again identified as noted on prior CT with significant focal kyphosis of the thoracolumbar spine at this level.  Images demonstrate localization of the L1 level and placement of an interbody fusion device.  Due to degree of osseous demineralization, bony landmarks of the compressed L1 vertebral body or difficult to visualize.  There appears to be a decrease in the degree of focal kyphosis at the L1 level on the image obtained at 1432 hr.  IMPRESSION: Placement of an interbody fusion device at the previously identified marked L1 compression fracture with decrease in degree of focal  kyphosis at L1.  Severe osseous demineralization.   Electronically Signed   By: Lavonia Dana M.D.   On: 03/28/2014 15:31   Ct Lumbar Spine Wo Contrast  03/30/2014   CLINICAL DATA:  L1 fracture.  EXAM: CT LUMBAR SPINE WITHOUT CONTRAST  TECHNIQUE: Multidetector CT imaging of the lumbar spine was performed without intravenous contrast administration. Multiplanar CT image reconstructions were also generated.  COMPARISON:  CT abdomen and pelvis 09/28/2013. Lumbar spine MRI 06/14/2013. Thoracolumbar radiographs 02/22/2014.  FINDINGS: Severe L1 compression fracture is again identified. There has been interval placement of an expandable interbody cage which extends from the inferior T12 to inferior L2 endplates. Focal kyphosis at this level has decreased. A small portion of the L1 superior endplate remains and protrudes approximately 6 mm into the spinal canal, with the remainder of the L1 vertebral body being largely resected in the interim. There has been interval depression of the superior L2 endplate. Very mild anterior height loss of the T11 vertebral body is stable to slightly increased from the prior CT but unchanged from more recent radiographs. There is mild height loss of the L3-L5 vertebral bodies, new from the prior CT but unchanged from more recent thoracolumbar radiographs.  There is mild thoracolumbar levoscoliosis. There is mild residual spinal stenosis at T12-L1 related to a small amount of residual superior endplate protruding into the canal. There is very mild spinal stenosis at L2-3 related to slight retropulsion of the L2 superior endplate. Mild atherosclerotic aortic calcification is noted. There are small bilateral pleural effusions as well as patchy right lower lobe and confluent left lower lobe parenchymal lung opacities, incompletely visualized.  IMPRESSION: 1. Interval interbody cage placement at L1-2 with reduction of focal kyphosis at this level. Mild residual spinal stenosis related to a  small residual L1 superior endplate fragment protruding into the ventral spinal canal. 2. Mild compression deformities of the T11, T12, and L3-L5 vertebral bodies, unchanged from recent thoracolumbar radiographs. 3. Small bilateral pleural effusions and left greater than right lower lobe lung opacities, incompletely visualized. Left lower lobe pneumonia not excluded.   Electronically Signed   By: Logan Bores   On: 03/30/2014 17:27   Dg Chest Port 1 View  03/30/2014   CLINICAL DATA:  Left pneumothorax.  EXAM: PORTABLE CHEST - 1 VIEW  COMPARISON:  March 29, 2014.  FINDINGS: Stable cardiomegaly. Left-sided chest tube is unchanged  in position. No pneumothorax is noted. Stable left perihilar and lower lobe opacity is noted concerning for atelectasis. Stable right midlung opacity is noted most consistent with subsegmental atelectasis. No significant pleural effusion is noted. Bony thorax is intact.  IMPRESSION: Stable bilateral lung opacities are noted with left greater than right. Left-sided chest tube remains without evidence of pneumothorax.   Electronically Signed   By: Sabino Dick M.D.   On: 03/30/2014 07:37   Dg Chest Port 1 View  03/29/2014   CLINICAL DATA:  63 year old female with left pneumothorax. Initial encounter.  EXAM: PORTABLE CHEST - 1 VIEW  COMPARISON:  03/28/2014 and earlier.  FINDINGS: Portable AP semi upright view at 0548 hrs. Extubated. Mildly lower lung volumes. Stable cardiac size and mediastinal contours.  Stable left chest tube. No pneumothorax identified. Mildly increased bilateral platelike perihilar atelectasis. More generalized increased density in the left lung has mildly regressed. No definite pleural effusion.  IMPRESSION: 1. Extubated.  Increased platelike perihilar atelectasis. 2. Stable left chest tube.  No pneumothorax.   Electronically Signed   By: Lars Pinks M.D.   On: 03/29/2014 07:54   Dg Chest Portable 1 View  03/28/2014   CLINICAL DATA:  Status post endotracheal tube  placement  EXAM: PORTABLE CHEST - 1 VIEW  COMPARISON:  None  FINDINGS: Cardiac shadow is mildly prominent. Right basilar atelectasis is seen. An endotracheal tube is noted at the level of the carina directed towards a right mainstem bronchus and should be withdrawn 2-3 cm. A left-sided chest tube is seen. Some increased density is noted along the course of the chest tube of uncertain significance. No pneumothorax is seen.  IMPRESSION: Endotracheal tube directed towards the right mainstem bronchus. This should be withdrawn 2-3 cm.  Left chest tube in satisfactory position. No pneumothorax is noted. Considerable density is noted along the course of the tube which may be related atelectasis.  Right basilar atelectasis is noted as well.  Critical Value/emergent results were called by telephone at the time of interpretation on 03/28/2014 at 4:02 pm to [place], the patient's nurse, who verbally acknowledged these results.   Electronically Signed   By: Inez Catalina M.D.   On: 03/28/2014 16:02   Dg C-arm Gt 120 Min  03/28/2014   CLINICAL DATA:  Anterolateral decompression and fusion at L1 fracture  EXAM: DG C-ARM GT 120 MIN; THORACOLUMBAR SPINE - 2 VIEW  FLUOROSCOPY TIME:  1 min 40 seconds  COMPARISON:  CT abdomen and pelvis 09/28/2013  FINDINGS: Six digital C-arm fluoroscopic images obtained intraoperatively or evaluated.  Marked compression fracture of the L1 vertebral body again identified as noted on prior CT with significant focal kyphosis of the thoracolumbar spine at this level.  Images demonstrate localization of the L1 level and placement of an interbody fusion device.  Due to degree of osseous demineralization, bony landmarks of the compressed L1 vertebral body or difficult to visualize.  There appears to be a decrease in the degree of focal kyphosis at the L1 level on the image obtained at 1432 hr.  IMPRESSION: Placement of an interbody fusion device at the previously identified marked L1 compression fracture  with decrease in degree of focal kyphosis at L1.  Severe osseous demineralization.   Electronically Signed   By: Lavonia Dana M.D.   On: 03/28/2014 15:31    CBC  Recent Labs Lab 03/27/14 1308 03/28/14 1316 03/28/14 1630 03/28/14 1940 03/29/14 0057 03/30/14 0335  WBC 10.6*  --  26.9* 25.9* 23.0* 12.9*  HGB 13.9 8.2* 15.6* 15.1* 13.3 10.0*  HCT 40.8 24.0* 45.9 43.4 39.3 30.4*  PLT 106*  --  110* 90* 80* 69*  MCV 85.4  --  87.8 85.3 85.6 86.1  MCH 29.1  --  29.8 29.7 29.0 28.3  MCHC 34.1  --  34.0 34.8 33.8 32.9  RDW 14.3  --  14.9 15.1 15.6* 15.8*  LYMPHSABS 1.5  --   --   --   --   --   MONOABS 0.6  --   --   --   --   --   EOSABS 0.4  --   --   --   --   --   BASOSABS 0.0  --   --   --   --   --     Chemistries   Recent Labs Lab 03/28/14 1316 03/29/14 0057 03/30/14 0335  NA 138 138 140  K 4.6 5.1 4.3  CL  --  102 105  CO2  --  23 24  GLUCOSE 146* 128* 99  BUN  --  17 8  CREATININE  --  0.87 0.60  CALCIUM  --  7.5* 7.5*   ------------------------------------------------------------------------------------------------------------------ estimated creatinine clearance is 64.4 ml/min (by C-G formula based on Cr of 0.6). ------------------------------------------------------------------------------------------------------------------ No results found for this basename: HGBA1C,  in the last 72 hours ------------------------------------------------------------------------------------------------------------------ No results found for this basename: CHOL, HDL, LDLCALC, TRIG, CHOLHDL, LDLDIRECT,  in the last 72 hours ------------------------------------------------------------------------------------------------------------------ No results found for this basename: TSH, T4TOTAL, FREET3, T3FREE, THYROIDAB,  in the last 72 hours ------------------------------------------------------------------------------------------------------------------ No results found for this basename:  VITAMINB12, FOLATE, FERRITIN, TIBC, IRON, RETICCTPCT,  in the last 72 hours  Coagulation profile No results found for this basename: INR, PROTIME,  in the last 168 hours  No results found for this basename: DDIMER,  in the last 72 hours  Cardiac Enzymes  Recent Labs Lab 03/28/14 1940 03/29/14 0057 03/29/14 0825  TROPONINI <0.30 <0.30 <0.30   ------------------------------------------------------------------------------------------------------------------ No components found with this basename: POCBNP,     Corynn Solberg D.O. on 04/02/2014 at 10:45 AM  Between 7am to 7pm - Pager - [phone]  After 7pm go to [email or web address] - password [email or web address]  And look for the night coverage person covering for me after hours  [facility] Office  [phone]

## 2014-04-02 NOTE — Progress Notes (Signed)
Patient ID: [name], female   DOB: [birth year], 63 y.o.   MRN: [id number] Mobilizing slowly. Still has considerable back pain Motor function appears to be intact Will recheck x-ray of thoracic spine.

## 2014-04-03 ENCOUNTER — Other Ambulatory Visit: Payer: BC Managed Care – PPO

## 2014-04-03 ENCOUNTER — Ambulatory Visit: Payer: BC Managed Care – PPO | Admitting: Hematology and Oncology

## 2014-04-03 ENCOUNTER — Ambulatory Visit: Payer: BC Managed Care – PPO

## 2014-04-03 MED ORDER — FLUTICASONE PROPIONATE 50 MCG/ACT NA SUSP
2.0000 | Freq: Every day | NASAL | Status: DC
  Administered 2014-04-03 – 2014-04-04 (×2): 2 via NASAL
  Filled 2014-04-03: qty 16

## 2014-04-03 NOTE — Progress Notes (Addendum)
Patient ID: [name], female   DOB: 03-31-1951, 63 y.o.   MRN: [id number] Vital signs are stable. Back pain is still somewhat difficult to manage. Patient has some compression of the upper portion of construct into the T12 vertebrae.  Plan is to discharge patient tomorrow afternoon. She'll have a ride available in the afternoon after 4 PM

## 2014-04-03 NOTE — Progress Notes (Signed)
Talked to patient about PCP- patient goes to [facility]; Patient states that she is going to change physicians and is thinking about where she wants to go for primary care; Patient has private insurance with [employer]; patient is deciding between [facility] and [facility]; Mindi Slicker RN,BSN,MHA [phone]

## 2014-04-03 NOTE — Progress Notes (Signed)
UR COMPLETED  

## 2014-04-03 NOTE — Progress Notes (Addendum)
[facility]                                                                              Patient Demographics  [name], is a 63 y.o. female, DOB - 18-Jul-1951, DJM:[id number]  Admit date - 03/28/2014   Admitting Physician Kristeen Miss, MD  Outpatient Primary MD for the patient is [name], [name], NP  LOS - 6   No chief complaint on file.     Admit HPI/Interim history 63 year old female with history of depression, ITP, history of nephrolithiasis, dysrhythmia, HTN,.  Individual who sustained a spontaneous burst fracture of L1. This occurred nearly a year ago. She was initially planned to do a kyphoplasty however she rapidly progressed to kyphosis and had elements of retropulsed bone causing significant stenosis. Surgical decompression was planned however the patient was found to be thrombocytopenic.  Patient underwent reconstruction from T11-L2 using nuvasive expandable interbody cage. She has been evaluated by PT/OT and will require home health.  Patient seen walking in the halls today.  From a medical standpoint, patient is stable for discharge.    Assessment & Plan  T12, L1 burst fracture  -Stabilized by Dr. Kristeen Miss (neurosurgery) with [facility] expandable interbody cage  -Patient able to ambulate today without excessive pain -PT/OT recommends home health with supervision.  -Continues to have back pain, continue pain control   Depression  -On no home medications -Patient states she is just tired of the back pain.  -Will need to follow up with PCP regarding possibly starting medications  ITP  -platelets appear to be at baseline  Hypertension -Stable, continue metoprolol   Constipation -patient was able to have several bowel movements yesterday -Continue colace/senokot, enema if needed -Secondary to pain medication  Nausea -Continue antiemetics -Secondary to pain medication  Code Status: Full  Family Communication: None at bedside  Disposition  Plan: Admitted.  Medically stable for discharge to home with home health.  Will speak to case management regarding patient's PCP followup.  Recommend patient establishes care with a PCP instead of going to urgent care.  Patient also needs outpatient DEXA scan.  Time Spent in minutes   25 minutes  Procedures/Significant Events  7/14 DG thoracic lumbar spine:Placement of an interbody fusion device at the previously identified  marked L1 compression fracture with decrease in degree of focal kyphosis at L1.  7/14 PCXR; Extubated. Increased platelike perihilar atelectasis. Stable Lt chest tube. No PTHx.  7/14 anterolateral decompression of T12 burst fracture with reconstruction from T11-L2 using nuvasive expandable interbody cage.  7/15 CT L-spine without contrast; Interval interbody cage placement at L1-2 with reduction of focal Kyphosis.  -Small residual L1 superior endplate fragment protruding into the ventral spinal canal.  - Mild compression deformities T11, T12, and L3-L5 vertebral bodies, unchanged from recent thoracolumbar radiographs.  -Small bilateral pleural effusions Lt > Rt lower lobe lung opacities, incompletely visualized. Left lower lobe pneumonia not excluded.  Consults   PCCM -->[facility] Cardiothoracic surgery   DVT Prophylaxis  SCDs  Lab Results  Component Value Date   PLT 69* 03/30/2014    Medications  Scheduled Meds: . docusate sodium  100 mg Oral BID  . fluticasone  2 spray  Each Nare Daily  . metoprolol succinate  50 mg Oral QPC supper  . pantoprazole  40 mg Oral Q1200  . senna  1 tablet Oral BID  . sodium chloride  3 mL Intravenous Q12H   Continuous Infusions: . sodium chloride     PRN Meds:.acetaminophen, acetaminophen, alum & mag hydroxide-simeth, bisacodyl, menthol-cetylpyridinium, methocarbamol (ROBAXIN) IV, methocarbamol, morphine, morphine injection, ondansetron (ZOFRAN) IV, oxyCODONE-acetaminophen, phenol, polyethylene glycol, sodium chloride  Antibiotics      Anti-infectives   Start     Dose/Rate Route Frequency Ordered Stop   03/28/14 2000  ceFAZolin (ANCEF) IVPB 1 g/50 mL premix     1 g 100 mL/hr over 30 Minutes Intravenous Every 8 hours 03/28/14 1838 03/29/14 0530   03/28/14 1515  ceFAZolin (ANCEF) IVPB 2 g/50 mL premix     2 g 100 mL/hr over 30 Minutes Intravenous  Once 03/28/14 1503 03/28/14 1430   03/28/14 1104  bacitracin 50,000 Units in sodium chloride irrigation 0.9 % 500 mL irrigation  Status:  Discontinued       As needed 03/28/14 1128 03/28/14 1615   03/28/14 0600  ceFAZolin (ANCEF) IVPB 2 g/50 mL premix     2 g 100 mL/hr over 30 Minutes Intravenous On call to O.R. 03/27/14 1422 03/28/14 1025        Subjective:   [name] seen and examined today.  Patient continues to have back pain and very tearful this morning. She is tired of dealing with her back pain.  She currently denies nausea or vomiting.  She is currently sitting up in a chair and having breakfast.  She denies chest pain, SOB, abdominal pain.  She does not wish to start medications regarding depression.   Objective:   Filed Vitals:   04/02/14 2039 04/03/14 0200 04/03/14 0700 04/03/14 0852  BP: 161/70 153/87 137/66 163/81  Pulse: 86 89 83 79  Temp: 99.9 F (37.7 C) 99 F (37.2 C) 98.7 F (37.1 C) 98.5 F (36.9 C)  TempSrc: Tympanic Oral Oral Oral  Resp: 20 20 18 18   Height:      Weight:      SpO2: 93% 92% 94% 92%    Wt Readings from Last 3 Encounters:  03/29/14 76.6 kg (168 lb 14 oz)  03/29/14 76.6 kg (168 lb 14 oz)  03/20/14 72.757 kg (160 lb 6.4 oz)     Intake/Output Summary (Last 24 hours) at 04/03/14 1146 Last data filed at 04/03/14 0830  Gross per 24 hour  Intake    363 ml  Output      0 ml  Net    363 ml    Exam  General: Well developed, well nourished, NAD, appears stated age, wearing cage  HEENT: NCAT, ucous membranes moist.   Cardiovascular: S1 S2 auscultated, no rubs, murmurs or gallops. Regular rate and  rhythm.  Respiratory: Clear to auscultation bilaterally with equal chest rise  Abdomen: Soft, obese, nontender, nondistended, + bowel sounds  Extremities: warm dry without cyanosis clubbing or edema, SCDs in place  Neuro: AAOx3, No focal deficits  Psych: Depressed affect, intact judgement and insight  Data Review   Micro Results No results found for this or any previous visit (from the past 240 hour(s)).  Radiology Reports Dg Chest 2 View  03/31/2014   CLINICAL DATA:  Left-sided pneumothorax.  Post chest tube placement  EXAM: CHEST  2 VIEW  COMPARISON:  03/30/2014; 03/28/2014  FINDINGS: Grossly unchanged enlarged cardiac silhouette and mediastinal contours. Interval removal of left-sided  chest tube without development of a pneumothorax. Overall improved aeration of the lungs with persistent bilateral mid and bilateral medial basilar heterogeneous opacities, left greater than right. Unchanged trace bilateral effusions. Unchanged bones including upper lumbar vertebral body replacement, incompletely evaluated  IMPRESSION: 1. Interval removal of left-sided chest tube without development of a pneumothorax. 2. Improved aeration of lungs with persistent bibasilar opacities, left greater than right, atelectasis versus infiltrate/contusion.   Electronically Signed   By: Sandi Mariscal M.D.   On: 03/31/2014 08:10   Dg Thoracolumbar Spine  03/28/2014   CLINICAL DATA:  Anterolateral decompression and fusion at L1 fracture  EXAM: DG C-ARM GT 120 MIN; THORACOLUMBAR SPINE - 2 VIEW  FLUOROSCOPY TIME:  1 min 40 seconds  COMPARISON:  CT abdomen and pelvis 09/28/2013  FINDINGS: Six digital C-arm fluoroscopic images obtained intraoperatively or evaluated.  Marked compression fracture of the L1 vertebral body again identified as noted on prior CT with significant focal kyphosis of the thoracolumbar spine at this level.  Images demonstrate localization of the L1 level and placement of an interbody fusion device.  Due  to degree of osseous demineralization, bony landmarks of the compressed L1 vertebral body or difficult to visualize.  There appears to be a decrease in the degree of focal kyphosis at the L1 level on the image obtained at 1432 hr.  IMPRESSION: Placement of an interbody fusion device at the previously identified marked L1 compression fracture with decrease in degree of focal kyphosis at L1.  Severe osseous demineralization.   Electronically Signed   By: Lavonia Dana M.D.   On: 03/28/2014 15:31   Ct Lumbar Spine Wo Contrast  03/30/2014   CLINICAL DATA:  L1 fracture.  EXAM: CT LUMBAR SPINE WITHOUT CONTRAST  TECHNIQUE: Multidetector CT imaging of the lumbar spine was performed without intravenous contrast administration. Multiplanar CT image reconstructions were also generated.  COMPARISON:  CT abdomen and pelvis 09/28/2013. Lumbar spine MRI 06/14/2013. Thoracolumbar radiographs 02/22/2014.  FINDINGS: Severe L1 compression fracture is again identified. There has been interval placement of an expandable interbody cage which extends from the inferior T12 to inferior L2 endplates. Focal kyphosis at this level has decreased. A small portion of the L1 superior endplate remains and protrudes approximately 6 mm into the spinal canal, with the remainder of the L1 vertebral body being largely resected in the interim. There has been interval depression of the superior L2 endplate. Very mild anterior height loss of the T11 vertebral body is stable to slightly increased from the prior CT but unchanged from more recent radiographs. There is mild height loss of the L3-L5 vertebral bodies, new from the prior CT but unchanged from more recent thoracolumbar radiographs.  There is mild thoracolumbar levoscoliosis. There is mild residual spinal stenosis at T12-L1 related to a small amount of residual superior endplate protruding into the canal. There is very mild spinal stenosis at L2-3 related to slight retropulsion of the L2 superior  endplate. Mild atherosclerotic aortic calcification is noted. There are small bilateral pleural effusions as well as patchy right lower lobe and confluent left lower lobe parenchymal lung opacities, incompletely visualized.  IMPRESSION: 1. Interval interbody cage placement at L1-2 with reduction of focal kyphosis at this level. Mild residual spinal stenosis related to a small residual L1 superior endplate fragment protruding into the ventral spinal canal. 2. Mild compression deformities of the T11, T12, and L3-L5 vertebral bodies, unchanged from recent thoracolumbar radiographs. 3. Small bilateral pleural effusions and left greater than right lower lobe  lung opacities, incompletely visualized. Left lower lobe pneumonia not excluded.   Electronically Signed   By: Logan Bores   On: 03/30/2014 17:27   Dg Chest Port 1 View  03/30/2014   CLINICAL DATA:  Left pneumothorax.  EXAM: PORTABLE CHEST - 1 VIEW  COMPARISON:  March 29, 2014.  FINDINGS: Stable cardiomegaly. Left-sided chest tube is unchanged in position. No pneumothorax is noted. Stable left perihilar and lower lobe opacity is noted concerning for atelectasis. Stable right midlung opacity is noted most consistent with subsegmental atelectasis. No significant pleural effusion is noted. Bony thorax is intact.  IMPRESSION: Stable bilateral lung opacities are noted with left greater than right. Left-sided chest tube remains without evidence of pneumothorax.   Electronically Signed   By: Sabino Dick M.D.   On: 03/30/2014 07:37   Dg Chest Port 1 View  03/29/2014   CLINICAL DATA:  63 year old female with left pneumothorax. Initial encounter.  EXAM: PORTABLE CHEST - 1 VIEW  COMPARISON:  03/28/2014 and earlier.  FINDINGS: Portable AP semi upright view at 0548 hrs. Extubated. Mildly lower lung volumes. Stable cardiac size and mediastinal contours.  Stable left chest tube. No pneumothorax identified. Mildly increased bilateral platelike perihilar atelectasis. More  generalized increased density in the left lung has mildly regressed. No definite pleural effusion.  IMPRESSION: 1. Extubated.  Increased platelike perihilar atelectasis. 2. Stable left chest tube.  No pneumothorax.   Electronically Signed   By: Lars Pinks M.D.   On: 03/29/2014 07:54   Dg Chest Portable 1 View  03/28/2014   CLINICAL DATA:  Status post endotracheal tube placement  EXAM: PORTABLE CHEST - 1 VIEW  COMPARISON:  None  FINDINGS: Cardiac shadow is mildly prominent. Right basilar atelectasis is seen. An endotracheal tube is noted at the level of the carina directed towards a right mainstem bronchus and should be withdrawn 2-3 cm. A left-sided chest tube is seen. Some increased density is noted along the course of the chest tube of uncertain significance. No pneumothorax is seen.  IMPRESSION: Endotracheal tube directed towards the right mainstem bronchus. This should be withdrawn 2-3 cm.  Left chest tube in satisfactory position. No pneumothorax is noted. Considerable density is noted along the course of the tube which may be related atelectasis.  Right basilar atelectasis is noted as well.  Critical Value/emergent results were called by telephone at the time of interpretation on 03/28/2014 at 4:02 pm to [place], the patient's nurse, who verbally acknowledged these results.   Electronically Signed   By: Inez Catalina M.D.   On: 03/28/2014 16:02   Dg C-arm Gt 120 Min  03/28/2014   CLINICAL DATA:  Anterolateral decompression and fusion at L1 fracture  EXAM: DG C-ARM GT 120 MIN; THORACOLUMBAR SPINE - 2 VIEW  FLUOROSCOPY TIME:  1 min 40 seconds  COMPARISON:  CT abdomen and pelvis 09/28/2013  FINDINGS: Six digital C-arm fluoroscopic images obtained intraoperatively or evaluated.  Marked compression fracture of the L1 vertebral body again identified as noted on prior CT with significant focal kyphosis of the thoracolumbar spine at this level.  Images demonstrate localization of the L1 level and placement of an  interbody fusion device.  Due to degree of osseous demineralization, bony landmarks of the compressed L1 vertebral body or difficult to visualize.  There appears to be a decrease in the degree of focal kyphosis at the L1 level on the image obtained at 1432 hr.  IMPRESSION: Placement of an interbody fusion device at the previously identified marked  L1 compression fracture with decrease in degree of focal kyphosis at L1.  Severe osseous demineralization.   Electronically Signed   By: Lavonia Dana M.D.   On: 03/28/2014 15:31    CBC  Recent Labs Lab 03/27/14 1308 03/28/14 1316 03/28/14 1630 03/28/14 1940 03/29/14 0057 03/30/14 0335  WBC 10.6*  --  26.9* 25.9* 23.0* 12.9*  HGB 13.9 8.2* 15.6* 15.1* 13.3 10.0*  HCT 40.8 24.0* 45.9 43.4 39.3 30.4*  PLT 106*  --  110* 90* 80* 69*  MCV 85.4  --  87.8 85.3 85.6 86.1  MCH 29.1  --  29.8 29.7 29.0 28.3  MCHC 34.1  --  34.0 34.8 33.8 32.9  RDW 14.3  --  14.9 15.1 15.6* 15.8*  LYMPHSABS 1.5  --   --   --   --   --   MONOABS 0.6  --   --   --   --   --   EOSABS 0.4  --   --   --   --   --   BASOSABS 0.0  --   --   --   --   --     Chemistries   Recent Labs Lab 03/28/14 1316 03/29/14 0057 03/30/14 0335  NA 138 138 140  K 4.6 5.1 4.3  CL  --  102 105  CO2  --  23 24  GLUCOSE 146* 128* 99  BUN  --  17 8  CREATININE  --  0.87 0.60  CALCIUM  --  7.5* 7.5*   ------------------------------------------------------------------------------------------------------------------ estimated creatinine clearance is 64.4 ml/min (by C-G formula based on Cr of 0.6). ------------------------------------------------------------------------------------------------------------------ No results found for this basename: HGBA1C,  in the last 72 hours ------------------------------------------------------------------------------------------------------------------ No results found for this basename: CHOL, HDL, LDLCALC, TRIG, CHOLHDL, LDLDIRECT,  in the last 72  hours ------------------------------------------------------------------------------------------------------------------ No results found for this basename: TSH, T4TOTAL, FREET3, T3FREE, THYROIDAB,  in the last 72 hours ------------------------------------------------------------------------------------------------------------------ No results found for this basename: VITAMINB12, FOLATE, FERRITIN, TIBC, IRON, RETICCTPCT,  in the last 72 hours  Coagulation profile No results found for this basename: INR, PROTIME,  in the last 168 hours  No results found for this basename: DDIMER,  in the last 72 hours  Cardiac Enzymes  Recent Labs Lab 03/28/14 1940 03/29/14 0057 03/29/14 0825  TROPONINI <0.30 <0.30 <0.30   ------------------------------------------------------------------------------------------------------------------ No components found with this basename: POCBNP,     Diedra Sinor D.O. on 04/03/2014 at 11:46 AM  Between 7am to 7pm - Pager - [phone]  After 7pm go to [email or web address] - password [email or web address]  And look for the night coverage person covering for me after hours  [facility] Office  [phone]

## 2014-04-03 NOTE — Progress Notes (Signed)
OT discharge  Spoke with pt and only question was about toileting and what to use for hygiene. Spoke only few minutes about this. No further OT concerns. OT signing off.  Finis Bud [phone]

## 2014-04-03 NOTE — Progress Notes (Deleted)
Patient is constipated obtained order for soap suds enema did not have desired response, may need to disimpact will continue to monitor.

## 2014-04-03 NOTE — Progress Notes (Signed)
Noted orders for [facility], patient does not want any HHC services at this time and stated that she has a walker at home; No needs identified at this time; CM will continue to follow for DCP; B Pennie Rushing [phone]

## 2014-04-04 MED ORDER — METHOCARBAMOL 500 MG PO TABS: 500.0000 mg | ORAL_TABLET | Freq: Four times a day (QID) | ORAL | Status: DC | PRN

## 2014-04-04 MED ORDER — OXYCODONE-ACETAMINOPHEN 5-325 MG PO TABS: 1.0000 | ORAL_TABLET | ORAL | Status: DC | PRN

## 2014-04-04 NOTE — Progress Notes (Signed)
Patient given discharge instructions and medication prescriptions. This RN went over discharge instructions with the patient. Patient verbalized understanding. Patient belongings gathered. IV removed. Patient left unit via wheelchair in a stable condition accompanied by charge RN. Burnell Blanks, RN

## 2014-04-04 NOTE — Progress Notes (Signed)
[facility]                                                                              Patient Demographics  [name], is a 63 y.o. female, DOB - [birth year], YHC:[id number]  Admit date - 03/28/2014   Admitting Physician Evelyn Miss, MD  Outpatient Primary MD for the patient is [name], Evelyn School, NP  LOS - 7   No chief complaint on file.     Admit HPI/Interim history 63 year old female with history of depression, ITP, history of nephrolithiasis, dysrhythmia, HTN,.  Individual who sustained a spontaneous burst fracture of L1. This occurred nearly a year ago. She was initially planned to do a kyphoplasty however she rapidly progressed to kyphosis and had elements of retropulsed bone causing significant stenosis. Surgical decompression was planned however the patient was found to be thrombocytopenic.  Patient underwent reconstruction from T11-L2 using nuvasive expandable interbody cage. She has been evaluated by PT/OT and will require home health.  Patient seen walking in the halls today.  From a medical standpoint, patient is stable for discharge.    Assessment & Plan  T12, L1 burst fracture  -Stabilized by Dr. Kristeen Maldonado (neurosurgery) with [facility] expandable interbody cage  -Patient able to ambulate today without excessive pain -PT/OT recommends home health with supervision.  -Continues to have back pain, continue pain control  -Patient will be discharged by Dr. Ellene Maldonado today.  Depression  -On no home medications -Patient states she is just tired of the back pain.  -Will need to follow up with PCP regarding possibly starting medications  ITP  -platelets appear to be at baseline  Hypertension -Stable, continue metoprolol   Constipation -Continue colace/senokot, enema if needed -Secondary to pain medication  Nausea -Continue antiemetics -Secondary to pain medication  Code Status: Full  Family Communication: None at bedside  Disposition Plan:  Admitted.  Medically stable for discharge to home with home health. Spoke with case management regarding patient's PCP followup.  Recommended patient establishes care with a PCP instead of going to urgent care.  Patient also needs outpatient DEXA scan.  Time Spent in minutes   20 minutes  Procedures/Significant Events  7/14 DG thoracic lumbar spine:Placement of an interbody fusion device at the previously identified  marked L1 compression fracture with decrease in degree of focal kyphosis at L1.  7/14 PCXR; Extubated. Increased platelike perihilar atelectasis. Stable Lt chest tube. No PTHx.  7/14 anterolateral decompression of T12 burst fracture with reconstruction from T11-L2 using nuvasive expandable interbody cage.  7/15 CT L-spine without contrast; Interval interbody cage placement at L1-2 with reduction of focal Kyphosis.  -Small residual L1 superior endplate fragment protruding into the ventral spinal canal.  - Mild compression deformities T11, T12, and L3-L5 vertebral bodies, unchanged from recent thoracolumbar radiographs.  -Small bilateral pleural effusions Lt > Rt lower lobe lung opacities, incompletely visualized. Left lower lobe pneumonia not excluded.  Consults   PCCM -->[facility] Cardiothoracic surgery   DVT Prophylaxis  SCDs  Lab Results  Component Value Date   PLT 69* 03/30/2014    Medications  Scheduled Meds: . docusate sodium  100 mg Oral BID  . fluticasone  2 spray Each Nare Daily  .  metoprolol succinate  50 mg Oral QPC supper  . pantoprazole  40 mg Oral Q1200  . senna  1 tablet Oral BID  . sodium chloride  3 mL Intravenous Q12H   Continuous Infusions: . sodium chloride     PRN Meds:.acetaminophen, acetaminophen, alum & mag hydroxide-simeth, bisacodyl, menthol-cetylpyridinium, methocarbamol (ROBAXIN) IV, methocarbamol, morphine, morphine injection, ondansetron (ZOFRAN) IV, oxyCODONE-acetaminophen, phenol, polyethylene glycol, sodium chloride  Antibiotics     Anti-infectives   Start     Dose/Rate Maldonado Frequency Ordered Stop   03/28/14 2000  ceFAZolin (ANCEF) IVPB 1 g/50 mL premix     1 g 100 mL/hr over 30 Minutes Intravenous Every 8 hours 03/28/14 1838 03/29/14 0530   03/28/14 1515  ceFAZolin (ANCEF) IVPB 2 g/50 mL premix     2 g 100 mL/hr over 30 Minutes Intravenous  Once 03/28/14 1503 03/28/14 1430   03/28/14 1104  bacitracin 50,000 Units in sodium chloride irrigation 0.9 % 500 mL irrigation  Status:  Discontinued       As needed 03/28/14 1128 03/28/14 1615   03/28/14 0600  ceFAZolin (ANCEF) IVPB 2 g/50 mL premix     2 g 100 mL/hr over 30 Minutes Intravenous On call to O.R. 03/27/14 1422 03/28/14 1025        Subjective:   [name] seen and examined today.  Patient continues to have back pain and nausea.  She was able to tolerate breakfast this morning. She is looking forward to going home.  She states she is not depressed, but this situation has her feeling down.  Denies chest pain, dizziness, shortness of breath.    Objective:   Filed Vitals:   04/03/14 2056 04/04/14 0152 04/04/14 0602 04/04/14 1000  BP: 158/74 160/69 165/69 164/81  Pulse: 73 77 82 86  Temp: 98.8 F (37.1 C) 98.5 F (36.9 C) 98.3 F (36.8 C) 98.4 F (36.9 C)  TempSrc: Oral Oral Oral Oral  Resp: 20 18 18 18   Height:      Weight:      SpO2: 94% 93% 92% 95%    Wt Readings from Last 3 Encounters:  03/29/14 76.6 kg (168 lb 14 oz)  03/29/14 76.6 kg (168 lb 14 oz)  03/20/14 72.757 kg (160 lb 6.4 oz)     Intake/Output Summary (Last 24 hours) at 04/04/14 1023 Last data filed at 04/04/14 0844  Gross per 24 hour  Intake    240 ml  Output      0 ml  Net    240 ml    Exam  General: Well developed, well nourished, NAD, appears stated age  HEENT: NCAT, mucous membranes moist.   Cardiovascular: S1 S2 auscultated, no rubs, murmurs or gallops. Regular rate and rhythm.  Respiratory: Clear to auscultation bilaterally with equal chest  rise  Abdomen: Soft, obese, nontender, nondistended, + bowel sounds  Extremities: warm dry without cyanosis clubbing or edema, SCDs in place  Neuro: AAOx3, No focal deficits  Psych: Depressed affect, intact judgement and insight  Data Review   Micro Results No results found for this or any previous visit (from the past 240 hour(s)).  Radiology Reports Dg Chest 2 View  03/31/2014   CLINICAL DATA:  Left-sided pneumothorax.  Post chest tube placement  EXAM: CHEST  2 VIEW  COMPARISON:  03/30/2014; 03/28/2014  FINDINGS: Grossly unchanged enlarged cardiac silhouette and mediastinal contours. Interval removal of left-sided chest tube without development of a pneumothorax. Overall improved aeration of the lungs with persistent bilateral mid and  bilateral medial basilar heterogeneous opacities, left greater than right. Unchanged trace bilateral effusions. Unchanged bones including upper lumbar vertebral body replacement, incompletely evaluated  IMPRESSION: 1. Interval removal of left-sided chest tube without development of a pneumothorax. 2. Improved aeration of lungs with persistent bibasilar opacities, left greater than right, atelectasis versus infiltrate/contusion.   Electronically Signed   By: Sandi Mariscal M.D.   On: 03/31/2014 08:10   Dg Thoracolumbar Spine  03/28/2014   CLINICAL DATA:  Anterolateral decompression and fusion at L1 fracture  EXAM: DG C-ARM GT 120 MIN; THORACOLUMBAR SPINE - 2 VIEW  FLUOROSCOPY TIME:  1 min 40 seconds  COMPARISON:  CT abdomen and pelvis 09/28/2013  FINDINGS: Six digital C-arm fluoroscopic images obtained intraoperatively or evaluated.  Marked compression fracture of the L1 vertebral body again identified as noted on prior CT with significant focal kyphosis of the thoracolumbar spine at this level.  Images demonstrate localization of the L1 level and placement of an interbody fusion device.  Due to degree of osseous demineralization, bony landmarks of the compressed L1  vertebral body or difficult to visualize.  There appears to be a decrease in the degree of focal kyphosis at the L1 level on the image obtained at 1432 hr.  IMPRESSION: Placement of an interbody fusion device at the previously identified marked L1 compression fracture with decrease in degree of focal kyphosis at L1.  Severe osseous demineralization.   Electronically Signed   By: Lavonia Dana M.D.   On: 03/28/2014 15:31   Ct Lumbar Spine Wo Contrast  03/30/2014   CLINICAL DATA:  L1 fracture.  EXAM: CT LUMBAR SPINE WITHOUT CONTRAST  TECHNIQUE: Multidetector CT imaging of the lumbar spine was performed without intravenous contrast administration. Multiplanar CT image reconstructions were also generated.  COMPARISON:  CT abdomen and pelvis 09/28/2013. Lumbar spine MRI 06/14/2013. Thoracolumbar radiographs 02/22/2014.  FINDINGS: Severe L1 compression fracture is again identified. There has been interval placement of an expandable interbody cage which extends from the inferior T12 to inferior L2 endplates. Focal kyphosis at this level has decreased. A small portion of the L1 superior endplate remains and protrudes approximately 6 mm into the spinal canal, with the remainder of the L1 vertebral body being largely resected in the interim. There has been interval depression of the superior L2 endplate. Very mild anterior height loss of the T11 vertebral body is stable to slightly increased from the prior CT but unchanged from more recent radiographs. There is mild height loss of the L3-L5 vertebral bodies, new from the prior CT but unchanged from more recent thoracolumbar radiographs.  There is mild thoracolumbar levoscoliosis. There is mild residual spinal stenosis at T12-L1 related to a small amount of residual superior endplate protruding into the canal. There is very mild spinal stenosis at L2-3 related to slight retropulsion of the L2 superior endplate. Mild atherosclerotic aortic calcification is noted. There are  small bilateral pleural effusions as well as patchy right lower lobe and confluent left lower lobe parenchymal lung opacities, incompletely visualized.  IMPRESSION: 1. Interval interbody cage placement at L1-2 with reduction of focal kyphosis at this level. Mild residual spinal stenosis related to a small residual L1 superior endplate fragment protruding into the ventral spinal canal. 2. Mild compression deformities of the T11, T12, and L3-L5 vertebral bodies, unchanged from recent thoracolumbar radiographs. 3. Small bilateral pleural effusions and left greater than right lower lobe lung opacities, incompletely visualized. Left lower lobe pneumonia not excluded.   Electronically Signed   By: Zenia Resides  Jeralyn Ruths   On: 03/30/2014 17:27   Dg Chest Port 1 View  03/30/2014   CLINICAL DATA:  Left pneumothorax.  EXAM: PORTABLE CHEST - 1 VIEW  COMPARISON:  March 29, 2014.  FINDINGS: Stable cardiomegaly. Left-sided chest tube is unchanged in position. No pneumothorax is noted. Stable left perihilar and lower lobe opacity is noted concerning for atelectasis. Stable right midlung opacity is noted most consistent with subsegmental atelectasis. No significant pleural effusion is noted. Bony thorax is intact.  IMPRESSION: Stable bilateral lung opacities are noted with left greater than right. Left-sided chest tube remains without evidence of pneumothorax.   Electronically Signed   By: Sabino Dick M.D.   On: 03/30/2014 07:37   Dg Chest Port 1 View  03/29/2014   CLINICAL DATA:  63 year old female with left pneumothorax. Initial encounter.  EXAM: PORTABLE CHEST - 1 VIEW  COMPARISON:  03/28/2014 and earlier.  FINDINGS: Portable AP semi upright view at 0548 hrs. Extubated. Mildly lower lung volumes. Stable cardiac size and mediastinal contours.  Stable left chest tube. No pneumothorax identified. Mildly increased bilateral platelike perihilar atelectasis. More generalized increased density in the left lung has mildly regressed. No  definite pleural effusion.  IMPRESSION: 1. Extubated.  Increased platelike perihilar atelectasis. 2. Stable left chest tube.  No pneumothorax.   Electronically Signed   By: Lars Pinks M.D.   On: 03/29/2014 07:54   Dg Chest Portable 1 View  03/28/2014   CLINICAL DATA:  Status post endotracheal tube placement  EXAM: PORTABLE CHEST - 1 VIEW  COMPARISON:  None  FINDINGS: Cardiac shadow is mildly prominent. Right basilar atelectasis is seen. An endotracheal tube is noted at the level of the carina directed towards a right mainstem bronchus and should be withdrawn 2-3 cm. A left-sided chest tube is seen. Some increased density is noted along the course of the chest tube of uncertain significance. No pneumothorax is seen.  IMPRESSION: Endotracheal tube directed towards the right mainstem bronchus. This should be withdrawn 2-3 cm.  Left chest tube in satisfactory position. No pneumothorax is noted. Considerable density is noted along the course of the tube which may be related atelectasis.  Right basilar atelectasis is noted as well.  Critical Value/emergent results were called by telephone at the time of interpretation on 03/28/2014 at 4:02 pm to [place], the patient's nurse, who verbally acknowledged these results.   Electronically Signed   By: Inez Catalina M.D.   On: 03/28/2014 16:02   Dg C-arm Gt 120 Min  03/28/2014   CLINICAL DATA:  Anterolateral decompression and fusion at L1 fracture  EXAM: DG C-ARM GT 120 MIN; THORACOLUMBAR SPINE - 2 VIEW  FLUOROSCOPY TIME:  1 min 40 seconds  COMPARISON:  CT abdomen and pelvis 09/28/2013  FINDINGS: Six digital C-arm fluoroscopic images obtained intraoperatively or evaluated.  Marked compression fracture of the L1 vertebral body again identified as noted on prior CT with significant focal kyphosis of the thoracolumbar spine at this level.  Images demonstrate localization of the L1 level and placement of an interbody fusion device.  Due to degree of osseous demineralization, bony  landmarks of the compressed L1 vertebral body or difficult to visualize.  There appears to be a decrease in the degree of focal kyphosis at the L1 level on the image obtained at 1432 hr.  IMPRESSION: Placement of an interbody fusion device at the previously identified marked L1 compression fracture with decrease in degree of focal kyphosis at L1.  Severe osseous demineralization.   Electronically  Signed   By: Lavonia Dana M.D.   On: 03/28/2014 15:31    CBC  Recent Labs Lab 03/28/14 1316 03/28/14 1630 03/28/14 1940 03/29/14 0057 03/30/14 0335  WBC  --  26.9* 25.9* 23.0* 12.9*  HGB 8.2* 15.6* 15.1* 13.3 10.0*  HCT 24.0* 45.9 43.4 39.3 30.4*  PLT  --  110* 90* 80* 69*  MCV  --  87.8 85.3 85.6 86.1  MCH  --  29.8 29.7 29.0 28.3  MCHC  --  34.0 34.8 33.8 32.9  RDW  --  14.9 15.1 15.6* 15.8*    Chemistries   Recent Labs Lab 03/28/14 1316 03/29/14 0057 03/30/14 0335  NA 138 138 140  K 4.6 5.1 4.3  CL  --  102 105  CO2  --  23 24  GLUCOSE 146* 128* 99  BUN  --  17 8  CREATININE  --  0.87 0.60  CALCIUM  --  7.5* 7.5*   ------------------------------------------------------------------------------------------------------------------ estimated creatinine clearance is 64.4 ml/min (by C-G formula based on Cr of 0.6). ------------------------------------------------------------------------------------------------------------------ No results found for this basename: HGBA1C,  in the last 72 hours ------------------------------------------------------------------------------------------------------------------ No results found for this basename: CHOL, HDL, LDLCALC, TRIG, CHOLHDL, LDLDIRECT,  in the last 72 hours ------------------------------------------------------------------------------------------------------------------ No results found for this basename: TSH, T4TOTAL, FREET3, T3FREE, THYROIDAB,  in the last 72  hours ------------------------------------------------------------------------------------------------------------------ No results found for this basename: VITAMINB12, FOLATE, FERRITIN, TIBC, IRON, RETICCTPCT,  in the last 72 hours  Coagulation profile No results found for this basename: INR, PROTIME,  in the last 168 hours  No results found for this basename: DDIMER,  in the last 72 hours  Cardiac Enzymes  Recent Labs Lab 03/28/14 1940 03/29/14 0057 03/29/14 0825  TROPONINI <0.30 <0.30 <0.30   ------------------------------------------------------------------------------------------------------------------ No components found with this basename: POCBNP,     Khaya Theissen D.O. on 04/04/2014 at 10:23 AM  Between 7am to 7pm - Pager - [phone]  After 7pm go to [email or web address] - password [email or web address]  And look for the night coverage person covering for me after hours  [facility] Office  [phone]

## 2014-04-05 ENCOUNTER — Telehealth: Payer: Self-pay | Admitting: Hematology and Oncology

## 2014-04-05 NOTE — Telephone Encounter (Signed)
s.w and advised on July appt....pt ok and aware

## 2014-04-10 ENCOUNTER — Other Ambulatory Visit (HOSPITAL_BASED_OUTPATIENT_CLINIC_OR_DEPARTMENT_OTHER): Payer: BC Managed Care – PPO

## 2014-04-10 ENCOUNTER — Encounter: Payer: Self-pay | Admitting: Hematology and Oncology

## 2014-04-10 ENCOUNTER — Telehealth: Payer: Self-pay | Admitting: Hematology and Oncology

## 2014-04-10 ENCOUNTER — Ambulatory Visit: Payer: BC Managed Care – PPO

## 2014-04-10 ENCOUNTER — Ambulatory Visit (HOSPITAL_BASED_OUTPATIENT_CLINIC_OR_DEPARTMENT_OTHER): Payer: BC Managed Care – PPO | Admitting: Hematology and Oncology

## 2014-04-10 VITALS — BP 187/78 | HR 73 | Temp 98.4°F | Resp 20 | Ht <= 58 in | Wt 155.4 lb

## 2014-04-10 DIAGNOSIS — F17200 Nicotine dependence, unspecified, uncomplicated: Secondary | ICD-10-CM

## 2014-04-10 DIAGNOSIS — M948X9 Other specified disorders of cartilage, unspecified sites: Secondary | ICD-10-CM

## 2014-04-10 DIAGNOSIS — S22080S Wedge compression fracture of T11-T12 vertebra, sequela: Secondary | ICD-10-CM

## 2014-04-10 DIAGNOSIS — M546 Pain in thoracic spine: Secondary | ICD-10-CM

## 2014-04-10 DIAGNOSIS — Z720 Tobacco use: Secondary | ICD-10-CM

## 2014-04-10 DIAGNOSIS — D693 Immune thrombocytopenic purpura: Secondary | ICD-10-CM

## 2014-04-10 LAB — CBC WITH DIFFERENTIAL/PLATELET
BASO%: 0.6 % (ref 0.0–2.0)
Basophils Absolute: 0.1 10*3/uL (ref 0.0–0.1)
EOS%: 4.5 % (ref 0.0–7.0)
Eosinophils Absolute: 0.6 10*3/uL — ABNORMAL HIGH (ref 0.0–0.5)
HCT: 40.7 % (ref 34.8–46.6)
HGB: 13.2 g/dL (ref 11.6–15.9)
LYMPH%: 10.7 % — ABNORMAL LOW (ref 14.0–49.7)
MCH: 27.8 pg (ref 25.1–34.0)
MCHC: 32.5 g/dL (ref 31.5–36.0)
MCV: 85.3 fL (ref 79.5–101.0)
MONO#: 0.7 10*3/uL (ref 0.1–0.9)
MONO%: 5.2 % (ref 0.0–14.0)
NEUT#: 10.2 10*3/uL — ABNORMAL HIGH (ref 1.5–6.5)
NEUT%: 79 % — ABNORMAL HIGH (ref 38.4–76.8)
Platelets: 109 10*3/uL — ABNORMAL LOW (ref 145–400)
RBC: 4.77 10*6/uL (ref 3.70–5.45)
RDW: 15.1 % — ABNORMAL HIGH (ref 11.2–14.5)
WBC: 13 10*3/uL — ABNORMAL HIGH (ref 3.9–10.3)
lymph#: 1.4 10*3/uL (ref 0.9–3.3)

## 2014-04-10 MED ORDER — ROMIPLOSTIM 250 MCG ~~LOC~~ SOLR
70.0000 ug | Freq: Once | SUBCUTANEOUS | Status: DC
  Filled 2014-04-10: qty 0.14

## 2014-04-10 NOTE — Progress Notes (Signed)
[name]  [name], [name], NP SUMMARY OF HEMATOLOGIC HISTORY: This is a pleasant 63 year old lady with progressive thrombocytopenia since 2009. She also have her mild leukocytosis and erythrocytosis, likely secondary to smoking. She had back fracture and was unable to proceed with surgery due to thrombocytopenia and was subsequently found to have ITP. On 08/29/2013, I started her on prednisone 60 mg daily for ITP On 09/14/2013, due to lack of response, I increase the prednisone to 80 mg once a day and add E56 and folic acid supplement On 09/23/2013, she was noted to be refractory to prednisone. I suspect initiating prednisone taper to 40 mg a day On 09/29/2013, prednisone dose was further reduced to 20 mg daily and then 10 mg daily. On 09/30/2013, she was started on rituximab weekly, completed treatment by February 6th, 2015. On 10/14/2013, I continue steroid taper to 5 mg daily and the patient completed steroid taper by mid-February 2015. On 03/09/2014, platelet count starting to decline and decision was made to start her on Nplate. From 03/1414 to 04/04/2014, she was hospitalized for surgery to her back.  INTERVAL HISTORY: [name] 63 y.o. female returns for further followup. She continues to wear her back brace. She denies worsening bone pain. She had mild constipation with pain medications, resolved with laxatives. In fact, she felt like she is able to slowly reduce her pain medication requirement. The patient denies any recent signs or symptoms of bleeding such as spontaneous epistaxis, hematuria or hematochezia.   I have reviewed the past medical history, past surgical history, social history and family history with the patient and they are unchanged from previous note.  ALLERGIES:  has No Known Allergies.  MEDICATIONS:  Current Outpatient Prescriptions  Medication Sig Dispense Refill  . acetaminophen (TYLENOL) 500 MG tablet Take  1,000 mg by mouth every 6 (six) hours as needed for moderate pain.      . Calcium Carbonate (OS-CAL PO) Take 1 tablet by mouth 2 (two) times daily.      . diphenhydramine-acetaminophen (TYLENOL PM) 25-500 MG TABS Take 2 tablets by mouth at bedtime as needed.      . folic acid (FOLVITE) 1 MG tablet Take 1 tablet (1 mg total) by mouth daily.  30 tablet  1  . methocarbamol (ROBAXIN) 500 MG tablet Take 1 tablet (500 mg total) by mouth every 6 (six) hours as needed for muscle spasms.  60 tablet  3  . metoprolol succinate (TOPROL-XL) 50 MG 24 hr tablet Take 50 mg by mouth daily after supper.       . morphine (MSIR) 15 MG tablet Take 1 tablet (15 mg total) by mouth every 6 (six) hours as needed for severe pain.  90 tablet  0  . oxyCODONE-acetaminophen (PERCOCET/ROXICET) 5-325 MG per tablet Take 1-2 tablets by mouth every 4 (four) hours as needed for moderate pain.  60 tablet  0  . ranitidine (ZANTAC) 150 MG tablet Take 150 mg by mouth 2 (two) times daily.      . vitamin B-12 (CYANOCOBALAMIN) 1000 MCG tablet Take 1 tablet (1,000 mcg total) by mouth daily.  30 tablet  1   No current facility-administered medications for this visit.     REVIEW OF SYSTEMS:   All other systems were reviewed with the patient and are negative.  PHYSICAL EXAMINATION: ECOG PERFORMANCE STATUS: 1 - Symptomatic but completely ambulatory  Filed Vitals:   04/10/14 1324  BP: 187/78  Pulse: 73  Temp: 98.4  F (36.9 C)  Resp: 20   Filed Weights   04/10/14 1324  Weight: 155 lb 6.4 oz (70.489 kg)    GENERAL:alert, no distress and comfortable. Examination is limited due to the presence of the back brace. SKIN: skin color, texture, turgor are normal, no rashes or significant lesions EYES: normal, Conjunctiva are pink and non-injected, sclera clear OROPHARYNX:no exudate, no erythema and lips, buccal mucosa, and tongue normal  LUNGS: clear to auscultation and percussion with normal breathing effort HEART: regular rate &  rhythm and no murmurs and no lower extremity edema NEURO: alert & oriented x 3 with fluent speech, no focal motor/sensory deficits  LABORATORY DATA:  I have reviewed the data as listed Results for orders placed in visit on 04/10/14 (from the past 48 hour(s))  CBC WITH DIFFERENTIAL     Status: Abnormal   Collection Time    04/10/14  1:15 PM      Result Value Ref Range   WBC 13.0 (*) 3.9 - 10.3 10e3/uL   NEUT# 10.2 (*) 1.5 - 6.5 10e3/uL   HGB 13.2  11.6 - 15.9 g/dL   HCT 40.7  34.8 - 46.6 %   Platelets 109 (*) 145 - 400 10e3/uL   MCV 85.3  79.5 - 101.0 fL   MCH 27.8  25.1 - 34.0 pg   MCHC 32.5  31.5 - 36.0 g/dL   RBC 4.77  3.70 - 5.45 10e6/uL   RDW 15.1 (*) 11.2 - 14.5 %   lymph# 1.4  0.9 - 3.3 10e3/uL   MONO# 0.7  0.1 - 0.9 10e3/uL   Eosinophils Absolute 0.6 (*) 0.0 - 0.5 10e3/uL   Basophils Absolute 0.1  0.0 - 0.1 10e3/uL   NEUT% 79.0 (*) 38.4 - 76.8 %   LYMPH% 10.7 (*) 14.0 - 49.7 %   MONO% 5.2  0.0 - 14.0 %   EOS% 4.5  0.0 - 7.0 %   BASO% 0.6  0.0 - 2.0 %    Lab Results  Component Value Date   WBC 13.0* 04/10/2014   HGB 13.2 04/10/2014   HCT 40.7 04/10/2014   MCV 85.3 04/10/2014   PLT 109* 04/10/2014   ASSESSMENT & PLAN:  ITP (idiopathic thrombocytopenic purpura) She has steroid refractory ITP. She did not respond to rituximab. She was placed on Nplate with good response her treatment. She had her surgery without any postoperative bleeding complications and did not require platelet transfusions. I will discontinue her injection. We'll see her back in 3 months a follow on chronic thrombocytopenia.  The patient will likely have chronic thrombocytopenia, however, as well as her platelet count is greater than 50,000, she would not require any further treatment.     Back pain She had surgery for her bone fracture. I recommend slow taper off her pain medicine.   T12 compression fracture This was related to trauma. I recommend continued calcium with vitamin D  supplements.  Tobacco abuse I spent some time counseling the patient the importance of tobacco cessation. she is currently attempting to quit on her own       All questions were answered. The patient knows to call the clinic with any problems, questions or concerns. No barriers to learning was detected.  I spent 15 minutes counseling the patient face to face. The total time spent in the appointment was 20 minutes and more than 50% was on counseling.     [facility], Keshauna Degraffenreid, MD 04/10/2014 8:04 PM

## 2014-04-10 NOTE — Assessment & Plan Note (Signed)
I spent some time counseling the patient the importance of tobacco cessation. she is currently attempting to quit on her own 

## 2014-04-10 NOTE — Telephone Encounter (Signed)
Pt confirmed labs/ov per 07/27 POF, gave pt AVS....KJ °

## 2014-04-10 NOTE — Assessment & Plan Note (Signed)
This was related to trauma. I recommend continued calcium with vitamin D supplements.

## 2014-04-10 NOTE — Assessment & Plan Note (Signed)
She has steroid refractory ITP. She did not respond to rituximab. She was placed on Nplate with good response her treatment. She had her surgery without any postoperative bleeding complications and did not require platelet transfusions. I will discontinue her injection. We'll see her back in 3 months a follow on chronic thrombocytopenia.  The patient will likely have chronic thrombocytopenia, however, as well as her platelet count is greater than 50,000, she would not require any further treatment.

## 2014-04-10 NOTE — Assessment & Plan Note (Signed)
She had surgery for her bone fracture. I recommend slow taper off her pain medicine.

## 2014-04-13 ENCOUNTER — Ambulatory Visit: Payer: BC Managed Care – PPO | Admitting: Hematology and Oncology

## 2014-04-17 ENCOUNTER — Other Ambulatory Visit: Payer: BC Managed Care – PPO

## 2014-04-17 ENCOUNTER — Ambulatory Visit: Payer: BC Managed Care – PPO

## 2014-05-15 NOTE — Discharge Summary (Signed)
Physician Discharge Summary  Patient ID: [name] MRN: [id number] DOB/AGE: 12/09/1950 63 y.o.  Admit date: 03/28/2014 Discharge date: 05/15/2014  Admission Diagnoses: L1 burst fracture, lumbar stenosis, intermittent thrombocytopenic purpura  Discharge Diagnoses: L1 burst fracture with lumbar stenosis, intermittent thrombocytopenic purpura, constipation.  Active Problems:   T12 compression fracture   Discharged Condition: fair  Hospital Course: Patient was admitted to undergo surgical decompression of an L1 burst fracture the cause severe she stenosis. She underwent an anterolateral procedure which she tolerated well. She was treated with platelet transfusions in addition to preoperative medication for ITP. She had problems with constipation and difficulties voiding postoperatively she required an extended stay in the hospital for this process to resolve and for her to become independent enough to be considered for discharge home.  Consults: Hospitalist  Significant Diagnostic Studies: None  Treatments: surgery: Anterolateral decompression of L1 burst fracture fixation from T12-L2.  Discharge Exam: Blood pressure 187/79, pulse 76, temperature 99.1 F (37.3 C), temperature source Oral, resp. rate 20, height 4\' 10"  (1.473 m), weight 76.6 kg (168 lb 14 oz), SpO2 95.00%. Incision is clean and dry. Motor function is intact in iliopsoas quadricep tibialis anterior and gastroc.  Disposition: 06-Home-Health Care Svc  Discharge Instructions   Ambulatory referral to Home Health    Complete by:  As directed   Please evaluate DORETTA REMMERT for admission to [facility].  Disciplines requested: Physical Therapy  Services to provide: Evaluate  Physician to follow patient's care (the person listed here will be responsible for signing ongoing orders): Referring Provider  Requested Start of Care Date: Tomorrow  I certify that this patient is under my care and that I, or a Nurse  Practitioner or Physician's Assistant working with me, had a face-to-face encounter that meets the physician face-to-face requirements with patient on 04/04/2014. The encounter with the patient was in whole, or in part for the following medical condition(s) which is the primary reason for home health care (List medical condition). Pathologic fracture T12 status post surgery to decompress and stabilize  Special Instructions:  Home safety evaluation  The encounter with the patient was in whole, or in part, for the following medical condition, which is the primary reason for home health care:  Pathologic fracture K53  I certify that, based on my findings, the following services are medically necessary home health services:  Physical therapy  My clinical findings support the need for the above services:  Pain interferes with ambulation/mobility  Further, I certify that my clinical findings support that this patient is homebound due to:  Ambulates short distances less than 300 feet  Reason for Medically Necessary Home Health Services:  Therapy- Personnel officer, Public librarian  Does the patient have Medicare or Medicaid?:  No     Call MD for:  redness, tenderness, or signs of infection (pain, swelling, redness, odor or green/yellow discharge around incision site)    Complete by:  As directed      Call MD for:  severe uncontrolled pain    Complete by:  As directed      Call MD for:  temperature >100.4    Complete by:  As directed      Diet - low sodium heart healthy    Complete by:  As directed      Increase activity slowly    Complete by:  As directed             Medication List  acetaminophen 500 MG tablet  Commonly known as:  TYLENOL  Take 1,000 mg by mouth every 6 (six) hours as needed for moderate pain.     diphenhydramine-acetaminophen 25-500 MG Tabs  Commonly known as:  TYLENOL PM  Take 2 tablets by mouth at bedtime as needed.     folic acid 1 MG tablet   Commonly known as:  FOLVITE  Take 1 tablet (1 mg total) by mouth daily.     methocarbamol 500 MG tablet  Commonly known as:  ROBAXIN  Take 1 tablet (500 mg total) by mouth every 6 (six) hours as needed for muscle spasms.     metoprolol succinate 50 MG 24 hr tablet  Commonly known as:  TOPROL-XL  Take 50 mg by mouth daily after supper.     morphine 15 MG tablet  Commonly known as:  MSIR  Take 1 tablet (15 mg total) by mouth every 6 (six) hours as needed for severe pain.     OS-CAL PO  Take 1 tablet by mouth 2 (two) times daily.     oxyCODONE-acetaminophen 5-325 MG per tablet  Commonly known as:  PERCOCET/ROXICET  Take 1-2 tablets by mouth every 4 (four) hours as needed for moderate pain.     ranitidine 150 MG tablet  Commonly known as:  ZANTAC  Take 150 mg by mouth 2 (two) times daily.     vitamin B-12 1000 MCG tablet  Commonly known as:  CYANOCOBALAMIN  Take 1 tablet (1,000 mcg total) by mouth daily.         SignedEarleen Newport 05/15/2014, 10:46 AM

## 2014-07-11 ENCOUNTER — Ambulatory Visit: Payer: BC Managed Care – PPO | Admitting: Hematology and Oncology

## 2014-07-11 ENCOUNTER — Other Ambulatory Visit: Payer: BC Managed Care – PPO

## 2014-07-11 ENCOUNTER — Telehealth: Payer: Self-pay | Admitting: Hematology and Oncology

## 2014-07-11 ENCOUNTER — Telehealth: Payer: Self-pay | Admitting: *Deleted

## 2014-07-11 NOTE — Telephone Encounter (Signed)
Pt left VM states she is sick and cannot make her appt today.  She needs to r/s.  Attempted to call pt back and there was no answer.  Sent request to scheduler to call pt to r/s her appts.

## 2014-07-11 NOTE — Telephone Encounter (Signed)
no vm available....mailed pt appt sched and letter °

## 2014-07-20 ENCOUNTER — Other Ambulatory Visit: Payer: BC Managed Care – PPO

## 2014-07-20 ENCOUNTER — Telehealth: Payer: Self-pay | Admitting: Hematology and Oncology

## 2014-07-20 ENCOUNTER — Ambulatory Visit: Payer: BC Managed Care – PPO | Admitting: Hematology and Oncology

## 2014-07-20 NOTE — Telephone Encounter (Signed)
returned pt call no vm available....pt sick today

## 2014-07-26 ENCOUNTER — Other Ambulatory Visit: Payer: Self-pay | Admitting: Hematology and Oncology

## 2014-07-31 ENCOUNTER — Ambulatory Visit: Payer: BC Managed Care – PPO | Admitting: Hematology and Oncology

## 2014-07-31 ENCOUNTER — Other Ambulatory Visit: Payer: BC Managed Care – PPO

## 2014-11-12 ENCOUNTER — Encounter (HOSPITAL_COMMUNITY): Payer: Self-pay | Admitting: *Deleted

## 2014-11-12 DIAGNOSIS — Z87442 Personal history of urinary calculi: Secondary | ICD-10-CM

## 2014-11-12 DIAGNOSIS — K219 Gastro-esophageal reflux disease without esophagitis: Secondary | ICD-10-CM | POA: Diagnosis present

## 2014-11-12 DIAGNOSIS — Z90710 Acquired absence of both cervix and uterus: Secondary | ICD-10-CM

## 2014-11-12 DIAGNOSIS — A419 Sepsis, unspecified organism: Principal | ICD-10-CM | POA: Diagnosis present

## 2014-11-12 DIAGNOSIS — M1990 Unspecified osteoarthritis, unspecified site: Secondary | ICD-10-CM | POA: Diagnosis present

## 2014-11-12 DIAGNOSIS — K5900 Constipation, unspecified: Secondary | ICD-10-CM | POA: Diagnosis present

## 2014-11-12 DIAGNOSIS — I129 Hypertensive chronic kidney disease with stage 1 through stage 4 chronic kidney disease, or unspecified chronic kidney disease: Secondary | ICD-10-CM | POA: Diagnosis present

## 2014-11-12 DIAGNOSIS — H1132 Conjunctival hemorrhage, left eye: Secondary | ICD-10-CM | POA: Diagnosis present

## 2014-11-12 DIAGNOSIS — K660 Peritoneal adhesions (postprocedural) (postinfection): Secondary | ICD-10-CM | POA: Diagnosis present

## 2014-11-12 DIAGNOSIS — N189 Chronic kidney disease, unspecified: Secondary | ICD-10-CM | POA: Diagnosis present

## 2014-11-12 DIAGNOSIS — Z716 Tobacco abuse counseling: Secondary | ICD-10-CM | POA: Diagnosis present

## 2014-11-12 DIAGNOSIS — F329 Major depressive disorder, single episode, unspecified: Secondary | ICD-10-CM | POA: Diagnosis present

## 2014-11-12 DIAGNOSIS — M4854XA Collapsed vertebra, not elsewhere classified, thoracic region, initial encounter for fracture: Secondary | ICD-10-CM | POA: Diagnosis present

## 2014-11-12 DIAGNOSIS — K430 Incisional hernia with obstruction, without gangrene: Secondary | ICD-10-CM | POA: Diagnosis present

## 2014-11-12 DIAGNOSIS — R109 Unspecified abdominal pain: Secondary | ICD-10-CM | POA: Diagnosis not present

## 2014-11-12 DIAGNOSIS — D693 Immune thrombocytopenic purpura: Secondary | ICD-10-CM | POA: Diagnosis present

## 2014-11-12 DIAGNOSIS — F17210 Nicotine dependence, cigarettes, uncomplicated: Secondary | ICD-10-CM | POA: Diagnosis present

## 2014-11-12 NOTE — ED Notes (Signed)
The pt is c/o abd pain for one week with constipation.  Last bm was one week ago.  Vomiting.  No bp meds for 2 weeks .   No money

## 2014-11-13 ENCOUNTER — Encounter (HOSPITAL_COMMUNITY): Admission: EM | Disposition: A | Discharge status: Home / Self Care | Payer: Self-pay | Source: Home / Self Care

## 2014-11-13 ENCOUNTER — Emergency Department (HOSPITAL_COMMUNITY): Payer: BC Managed Care – PPO

## 2014-11-13 ENCOUNTER — Encounter (HOSPITAL_COMMUNITY): Payer: Self-pay | Admitting: Radiology

## 2014-11-13 ENCOUNTER — Inpatient Hospital Stay (HOSPITAL_COMMUNITY)
Admission: EM | Admit: 2014-11-13 | Discharge: 2014-11-17 | DRG: 854 | Disposition: A | Discharge status: Home / Self Care | Payer: BC Managed Care – PPO | Attending: General Surgery | Admitting: General Surgery

## 2014-11-13 ENCOUNTER — Inpatient Hospital Stay (HOSPITAL_COMMUNITY): Payer: BC Managed Care – PPO | Admitting: Anesthesiology

## 2014-11-13 DIAGNOSIS — M1990 Unspecified osteoarthritis, unspecified site: Secondary | ICD-10-CM | POA: Diagnosis present

## 2014-11-13 DIAGNOSIS — Z716 Tobacco abuse counseling: Secondary | ICD-10-CM | POA: Diagnosis present

## 2014-11-13 DIAGNOSIS — D696 Thrombocytopenia, unspecified: Secondary | ICD-10-CM | POA: Diagnosis not present

## 2014-11-13 DIAGNOSIS — R109 Unspecified abdominal pain: Secondary | ICD-10-CM

## 2014-11-13 DIAGNOSIS — A419 Sepsis, unspecified organism: Principal | ICD-10-CM | POA: Diagnosis present

## 2014-11-13 DIAGNOSIS — I10 Essential (primary) hypertension: Secondary | ICD-10-CM | POA: Diagnosis not present

## 2014-11-13 DIAGNOSIS — N189 Chronic kidney disease, unspecified: Secondary | ICD-10-CM | POA: Diagnosis present

## 2014-11-13 DIAGNOSIS — Z90710 Acquired absence of both cervix and uterus: Secondary | ICD-10-CM | POA: Diagnosis not present

## 2014-11-13 DIAGNOSIS — Z87891 Personal history of nicotine dependence: Secondary | ICD-10-CM | POA: Diagnosis present

## 2014-11-13 DIAGNOSIS — K219 Gastro-esophageal reflux disease without esophagitis: Secondary | ICD-10-CM | POA: Diagnosis present

## 2014-11-13 DIAGNOSIS — H1130 Conjunctival hemorrhage, unspecified eye: Secondary | ICD-10-CM

## 2014-11-13 DIAGNOSIS — D72829 Elevated white blood cell count, unspecified: Secondary | ICD-10-CM

## 2014-11-13 DIAGNOSIS — F329 Major depressive disorder, single episode, unspecified: Secondary | ICD-10-CM | POA: Diagnosis present

## 2014-11-13 DIAGNOSIS — I129 Hypertensive chronic kidney disease with stage 1 through stage 4 chronic kidney disease, or unspecified chronic kidney disease: Secondary | ICD-10-CM | POA: Diagnosis present

## 2014-11-13 DIAGNOSIS — H1132 Conjunctival hemorrhage, left eye: Secondary | ICD-10-CM | POA: Diagnosis present

## 2014-11-13 DIAGNOSIS — K210 Gastro-esophageal reflux disease with esophagitis: Secondary | ICD-10-CM

## 2014-11-13 DIAGNOSIS — D693 Immune thrombocytopenic purpura: Secondary | ICD-10-CM | POA: Diagnosis present

## 2014-11-13 DIAGNOSIS — K5669 Other intestinal obstruction: Secondary | ICD-10-CM

## 2014-11-13 DIAGNOSIS — K56609 Unspecified intestinal obstruction, unspecified as to partial versus complete obstruction: Secondary | ICD-10-CM

## 2014-11-13 DIAGNOSIS — D72828 Other elevated white blood cell count: Secondary | ICD-10-CM | POA: Diagnosis not present

## 2014-11-13 DIAGNOSIS — K5900 Constipation, unspecified: Secondary | ICD-10-CM | POA: Diagnosis present

## 2014-11-13 DIAGNOSIS — K5660 Unspecified intestinal obstruction: Secondary | ICD-10-CM

## 2014-11-13 DIAGNOSIS — K439 Ventral hernia without obstruction or gangrene: Secondary | ICD-10-CM

## 2014-11-13 DIAGNOSIS — Z720 Tobacco use: Secondary | ICD-10-CM

## 2014-11-13 DIAGNOSIS — K430 Incisional hernia with obstruction, without gangrene: Secondary | ICD-10-CM | POA: Diagnosis present

## 2014-11-13 DIAGNOSIS — R0602 Shortness of breath: Secondary | ICD-10-CM | POA: Diagnosis not present

## 2014-11-13 DIAGNOSIS — Z87442 Personal history of urinary calculi: Secondary | ICD-10-CM | POA: Diagnosis not present

## 2014-11-13 DIAGNOSIS — M4854XA Collapsed vertebra, not elsewhere classified, thoracic region, initial encounter for fracture: Secondary | ICD-10-CM | POA: Diagnosis present

## 2014-11-13 DIAGNOSIS — K660 Peritoneal adhesions (postprocedural) (postinfection): Secondary | ICD-10-CM | POA: Diagnosis present

## 2014-11-13 DIAGNOSIS — R7989 Other specified abnormal findings of blood chemistry: Secondary | ICD-10-CM

## 2014-11-13 DIAGNOSIS — F17210 Nicotine dependence, cigarettes, uncomplicated: Secondary | ICD-10-CM | POA: Diagnosis present

## 2014-11-13 HISTORY — PX: INCISIONAL HERNIA REPAIR: SHX193

## 2014-11-13 LAB — COMPREHENSIVE METABOLIC PANEL WITH GFR
ALT: 62 U/L — ABNORMAL HIGH (ref 0–35)
AST: 30 U/L (ref 0–37)
Albumin: 5.1 g/dL (ref 3.5–5.2)
Alkaline Phosphatase: 133 U/L — ABNORMAL HIGH (ref 39–117)
Anion gap: 16 — ABNORMAL HIGH (ref 5–15)
BUN: 18 mg/dL (ref 6–23)
CO2: 23 mmol/L (ref 19–32)
Calcium: 10 mg/dL (ref 8.4–10.5)
Chloride: 98 mmol/L (ref 96–112)
Creatinine, Ser: 0.82 mg/dL (ref 0.50–1.10)
GFR calc Af Amer: 87 mL/min — ABNORMAL LOW
GFR calc non Af Amer: 75 mL/min — ABNORMAL LOW
Glucose, Bld: 218 mg/dL — ABNORMAL HIGH (ref 70–99)
Potassium: 4 mmol/L (ref 3.5–5.1)
Sodium: 137 mmol/L (ref 135–145)
Total Bilirubin: 1.1 mg/dL (ref 0.3–1.2)
Total Protein: 7.3 g/dL (ref 6.0–8.3)

## 2014-11-13 LAB — URINE MICROSCOPIC-ADD ON

## 2014-11-13 LAB — CBC WITH DIFFERENTIAL/PLATELET
Basophils Absolute: 0 K/uL (ref 0.0–0.1)
Basophils Relative: 0 % (ref 0–1)
Eosinophils Absolute: 0 K/uL (ref 0.0–0.7)
Eosinophils Relative: 0 % (ref 0–5)
HCT: 47.3 % — ABNORMAL HIGH (ref 36.0–46.0)
Hemoglobin: 16.7 g/dL — ABNORMAL HIGH (ref 12.0–15.0)
Lymphocytes Relative: 5 % — ABNORMAL LOW (ref 12–46)
Lymphs Abs: 1.5 K/uL (ref 0.7–4.0)
MCH: 30.3 pg (ref 26.0–34.0)
MCHC: 35.3 g/dL (ref 30.0–36.0)
MCV: 85.7 fL (ref 78.0–100.0)
Monocytes Absolute: 1.5 K/uL — ABNORMAL HIGH (ref 0.1–1.0)
Monocytes Relative: 5 % (ref 3–12)
Neutro Abs: 27.5 K/uL — ABNORMAL HIGH (ref 1.7–7.7)
Neutrophils Relative %: 90 % — ABNORMAL HIGH (ref 43–77)
Platelets: 122 K/uL — ABNORMAL LOW (ref 150–400)
RBC: 5.52 MIL/uL — ABNORMAL HIGH (ref 3.87–5.11)
RDW: 13.7 % (ref 11.5–15.5)
WBC: 30.5 K/uL — ABNORMAL HIGH (ref 4.0–10.5)

## 2014-11-13 LAB — URINALYSIS, ROUTINE W REFLEX MICROSCOPIC
Glucose, UA: NEGATIVE mg/dL
Hgb urine dipstick: NEGATIVE
Ketones, ur: 40 mg/dL — AB
Leukocytes, UA: NEGATIVE
Nitrite: NEGATIVE
Protein, ur: 30 mg/dL — AB
Specific Gravity, Urine: 1.033 — ABNORMAL HIGH (ref 1.005–1.030)
Urobilinogen, UA: 0.2 mg/dL (ref 0.0–1.0)
pH: 5 (ref 5.0–8.0)

## 2014-11-13 LAB — TYPE AND SCREEN
ABO/RH(D): A POS
Antibody Screen: NEGATIVE

## 2014-11-13 LAB — LACTIC ACID, PLASMA
Lactic Acid, Venous: 1.1 mmol/L (ref 0.5–2.0)
Lactic Acid, Venous: 1.3 mmol/L (ref 0.5–2.0)

## 2014-11-13 LAB — PROTIME-INR
INR: 0.99 (ref 0.00–1.49)
Prothrombin Time: 13.2 seconds (ref 11.6–15.2)

## 2014-11-13 LAB — LIPASE, BLOOD: Lipase: 28 U/L (ref 11–59)

## 2014-11-13 LAB — GLUCOSE, CAPILLARY: Glucose-Capillary: 122 mg/dL — ABNORMAL HIGH (ref 70–99)

## 2014-11-13 LAB — I-STAT CG4 LACTIC ACID, ED
Lactic Acid, Venous: 3.01 mmol/L (ref 0.5–2.0)
Lactic Acid, Venous: 1.44 mmol/L (ref 0.5–2.0)

## 2014-11-13 LAB — APTT
aPTT: 35 seconds (ref 24–37)
aPTT: 28 seconds (ref 24–37)

## 2014-11-13 LAB — PROCALCITONIN: Procalcitonin: 0.33 ng/mL

## 2014-11-13 SURGERY — REPAIR, HERNIA, INCISIONAL
Anesthesia: General | Site: Abdomen

## 2014-11-13 MED ORDER — CEFAZOLIN SODIUM-DEXTROSE 2-3 GM-% IV SOLR
2.0000 g | Freq: Three times a day (TID) | INTRAVENOUS | Status: AC
  Administered 2014-11-13 (×2): 2 g via INTRAVENOUS
  Filled 2014-11-13 (×3): qty 50

## 2014-11-13 MED ORDER — ONDANSETRON HCL 4 MG/2ML IJ SOLN
INTRAMUSCULAR | Status: AC
  Filled 2014-11-13: qty 2

## 2014-11-13 MED ORDER — METOPROLOL SUCCINATE ER 50 MG PO TB24
50.0000 mg | ORAL_TABLET | Freq: Every day | ORAL | Status: DC
  Administered 2014-11-13 – 2014-11-16 (×3): 50 mg via ORAL
  Filled 2014-11-13 (×5): qty 1

## 2014-11-13 MED ORDER — DEXAMETHASONE SODIUM PHOSPHATE 4 MG/ML IJ SOLN
INTRAMUSCULAR | Status: AC
  Filled 2014-11-13: qty 1

## 2014-11-13 MED ORDER — PANTOPRAZOLE SODIUM 40 MG IV SOLR
40.0000 mg | Freq: Once | INTRAVENOUS | Status: AC
  Administered 2014-11-13: 40 mg via INTRAVENOUS
  Filled 2014-11-13: qty 40

## 2014-11-13 MED ORDER — FENTANYL CITRATE 0.05 MG/ML IJ SOLN
INTRAMUSCULAR | Status: AC
  Filled 2014-11-13: qty 5

## 2014-11-13 MED ORDER — DEXAMETHASONE SODIUM PHOSPHATE 4 MG/ML IJ SOLN
INTRAMUSCULAR | Status: DC | PRN
  Administered 2014-11-13: 4 mg via INTRAVENOUS

## 2014-11-13 MED ORDER — METHOCARBAMOL 500 MG PO TABS: 500.0000 mg | ORAL_TABLET | Freq: Four times a day (QID) | ORAL | Status: DC | PRN

## 2014-11-13 MED ORDER — PANTOPRAZOLE SODIUM 40 MG IV SOLR
40.0000 mg | INTRAVENOUS | Status: DC
  Administered 2014-11-14 – 2014-11-15 (×3): 40 mg via INTRAVENOUS
  Filled 2014-11-13 (×4): qty 40

## 2014-11-13 MED ORDER — MORPHINE SULFATE 2 MG/ML IJ SOLN
1.0000 mg | INTRAMUSCULAR | Status: DC | PRN
  Administered 2014-11-13 – 2014-11-14 (×3): 2 mg via INTRAVENOUS
  Filled 2014-11-13 (×3): qty 1

## 2014-11-13 MED ORDER — ENOXAPARIN SODIUM 40 MG/0.4ML ~~LOC~~ SOLN
40.0000 mg | SUBCUTANEOUS | Status: DC
  Administered 2014-11-14 – 2014-11-15 (×2): 40 mg via SUBCUTANEOUS
  Filled 2014-11-13 (×2): qty 0.4

## 2014-11-13 MED ORDER — MIDAZOLAM HCL 2 MG/2ML IJ SOLN
INTRAMUSCULAR | Status: AC
  Filled 2014-11-13: qty 2

## 2014-11-13 MED ORDER — ARTIFICIAL TEARS OP OINT
TOPICAL_OINTMENT | OPHTHALMIC | Status: AC
  Filled 2014-11-13: qty 7

## 2014-11-13 MED ORDER — HYDROMORPHONE HCL 1 MG/ML IJ SOLN
1.0000 mg | Freq: Once | INTRAMUSCULAR | Status: AC
  Administered 2014-11-13: 1 mg via INTRAVENOUS
  Filled 2014-11-13: qty 1

## 2014-11-13 MED ORDER — ACETAMINOPHEN 500 MG PO TABS: 1000.0000 mg | ORAL_TABLET | Freq: Three times a day (TID) | ORAL | Status: DC | PRN

## 2014-11-13 MED ORDER — POLYETHYLENE GLYCOL 3350 17 G PO PACK
17.0000 g | PACK | Freq: Every day | ORAL | Status: DC | PRN
  Filled 2014-11-13: qty 1

## 2014-11-13 MED ORDER — SODIUM CHLORIDE 0.9 % IV SOLN: INTRAVENOUS | Status: DC

## 2014-11-13 MED ORDER — FLUCONAZOLE IN SODIUM CHLORIDE 200-0.9 MG/100ML-% IV SOLN
150.0000 mg | Freq: Once | INTRAVENOUS | Status: AC
  Administered 2014-11-13: 150 mg via INTRAVENOUS
  Filled 2014-11-13: qty 75

## 2014-11-13 MED ORDER — PROPOFOL 10 MG/ML IV BOLUS
INTRAVENOUS | Status: DC | PRN
  Administered 2014-11-13: 100 mg via INTRAVENOUS

## 2014-11-13 MED ORDER — LIDOCAINE HCL (CARDIAC) 20 MG/ML IV SOLN
INTRAVENOUS | Status: DC | PRN
  Administered 2014-11-13: 40 mg via INTRAVENOUS

## 2014-11-13 MED ORDER — PHENYLEPHRINE 40 MCG/ML (10ML) SYRINGE FOR IV PUSH (FOR BLOOD PRESSURE SUPPORT)
PREFILLED_SYRINGE | INTRAVENOUS | Status: AC
  Filled 2014-11-13: qty 10

## 2014-11-13 MED ORDER — 0.9 % SODIUM CHLORIDE (POUR BTL) OPTIME
TOPICAL | Status: DC | PRN
  Administered 2014-11-13 (×2): 1000 mL

## 2014-11-13 MED ORDER — OXYCODONE-ACETAMINOPHEN 5-325 MG PO TABS
1.0000 | ORAL_TABLET | ORAL | Status: DC | PRN
  Administered 2014-11-17 (×2): 2 via ORAL
  Filled 2014-11-13 (×2): qty 2

## 2014-11-13 MED ORDER — ARTIFICIAL TEARS OP OINT
TOPICAL_OINTMENT | OPHTHALMIC | Status: DC | PRN
Start: 2014-11-13 — End: 2014-11-13
  Administered 2014-11-13: 1 via OPHTHALMIC

## 2014-11-13 MED ORDER — FENTANYL CITRATE 0.05 MG/ML IJ SOLN
INTRAMUSCULAR | Status: DC | PRN
  Administered 2014-11-13 (×2): 50 ug via INTRAVENOUS
  Administered 2014-11-13: 100 ug via INTRAVENOUS
  Administered 2014-11-13 (×5): 50 ug via INTRAVENOUS

## 2014-11-13 MED ORDER — SODIUM CHLORIDE 0.9 % IJ SOLN
3.0000 mL | Freq: Two times a day (BID) | INTRAMUSCULAR | Status: DC
  Administered 2014-11-16 – 2014-11-17 (×3): 3 mL via INTRAVENOUS

## 2014-11-13 MED ORDER — ONDANSETRON HCL 4 MG/2ML IJ SOLN
4.0000 mg | Freq: Once | INTRAMUSCULAR | Status: AC
  Administered 2014-11-13: 4 mg via INTRAVENOUS
  Filled 2014-11-13: qty 2

## 2014-11-13 MED ORDER — DIPHENHYDRAMINE HCL 25 MG PO CAPS: 50.0000 mg | ORAL_CAPSULE | Freq: Every evening | ORAL | Status: DC | PRN

## 2014-11-13 MED ORDER — CEFAZOLIN SODIUM-DEXTROSE 2-3 GM-% IV SOLR
INTRAVENOUS | Status: AC
  Filled 2014-11-13: qty 50

## 2014-11-13 MED ORDER — SODIUM CHLORIDE 0.9 % IV SOLN
1000.0000 mL | INTRAVENOUS | Status: DC
  Administered 2014-11-13 (×2): 1000 mL via INTRAVENOUS

## 2014-11-13 MED ORDER — ROCURONIUM BROMIDE 50 MG/5ML IV SOLN
INTRAVENOUS | Status: AC
  Filled 2014-11-13: qty 1

## 2014-11-13 MED ORDER — BUPIVACAINE-EPINEPHRINE (PF) 0.25% -1:200000 IJ SOLN
INTRAMUSCULAR | Status: AC
  Filled 2014-11-13: qty 30

## 2014-11-13 MED ORDER — DIPHENHYDRAMINE-APAP (SLEEP) 25-500 MG PO TABS: 2.0000 | ORAL_TABLET | Freq: Every evening | ORAL | Status: DC | PRN

## 2014-11-13 MED ORDER — MORPHINE SULFATE 15 MG PO TABS: 15.0000 mg | ORAL_TABLET | Freq: Four times a day (QID) | ORAL | Status: DC | PRN

## 2014-11-13 MED ORDER — LACTATED RINGERS IV SOLN
INTRAVENOUS | Status: DC | PRN
  Administered 2014-11-13 (×2): via INTRAVENOUS

## 2014-11-13 MED ORDER — ONDANSETRON HCL 4 MG/2ML IJ SOLN
4.0000 mg | Freq: Three times a day (TID) | INTRAMUSCULAR | Status: AC | PRN
  Administered 2014-11-13: 4 mg via INTRAVENOUS
  Filled 2014-11-13: qty 2

## 2014-11-13 MED ORDER — ROCURONIUM BROMIDE 100 MG/10ML IV SOLN
INTRAVENOUS | Status: DC | PRN
  Administered 2014-11-13: 30 mg via INTRAVENOUS
  Administered 2014-11-13 (×2): 10 mg via INTRAVENOUS

## 2014-11-13 MED ORDER — NYSTATIN 100000 UNIT/GM EX POWD
Freq: Three times a day (TID) | CUTANEOUS | Status: DC
  Administered 2014-11-13 – 2014-11-17 (×11): via TOPICAL
  Filled 2014-11-13: qty 15

## 2014-11-13 MED ORDER — HYDROMORPHONE HCL 1 MG/ML IJ SOLN
0.2500 mg | INTRAMUSCULAR | Status: DC | PRN
  Administered 2014-11-13 (×4): 0.5 mg via INTRAVENOUS

## 2014-11-13 MED ORDER — PHENYLEPHRINE HCL 10 MG/ML IJ SOLN
INTRAMUSCULAR | Status: DC | PRN
  Administered 2014-11-13: 80 ug via INTRAVENOUS

## 2014-11-13 MED ORDER — IOHEXOL 300 MG/ML  SOLN
25.0000 mL | Freq: Once | INTRAMUSCULAR | Status: AC | PRN
  Administered 2014-11-13: 25 mL via ORAL

## 2014-11-13 MED ORDER — ONDANSETRON HCL 4 MG/2ML IJ SOLN
INTRAMUSCULAR | Status: DC | PRN
  Administered 2014-11-13: 4 mg via INTRAVENOUS

## 2014-11-13 MED ORDER — SUCCINYLCHOLINE CHLORIDE 20 MG/ML IJ SOLN
INTRAMUSCULAR | Status: AC
  Filled 2014-11-13: qty 1

## 2014-11-13 MED ORDER — IOHEXOL 300 MG/ML  SOLN
100.0000 mL | Freq: Once | INTRAMUSCULAR | Status: AC | PRN
  Administered 2014-11-13: 100 mL via INTRAVENOUS

## 2014-11-13 MED ORDER — SODIUM CHLORIDE 0.9 % IV BOLUS (SEPSIS): 2000.0000 mL | Freq: Once | INTRAVENOUS | Status: DC

## 2014-11-13 MED ORDER — MIDAZOLAM HCL 5 MG/5ML IJ SOLN
INTRAMUSCULAR | Status: DC | PRN
  Administered 2014-11-13 (×2): 1 mg via INTRAVENOUS

## 2014-11-13 MED ORDER — HYDROMORPHONE HCL 1 MG/ML IJ SOLN
1.0000 mg | INTRAMUSCULAR | Status: DC | PRN
  Administered 2014-11-13 – 2014-11-17 (×19): 1 mg via INTRAVENOUS
  Filled 2014-11-13 (×19): qty 1

## 2014-11-13 MED ORDER — HYDROMORPHONE HCL 1 MG/ML IJ SOLN
INTRAMUSCULAR | Status: AC
Start: 2014-11-13 — End: 2014-11-14
  Filled 2014-11-13: qty 1

## 2014-11-13 MED ORDER — NEOSTIGMINE METHYLSULFATE 10 MG/10ML IV SOLN
INTRAVENOUS | Status: AC
  Filled 2014-11-13: qty 2

## 2014-11-13 MED ORDER — GLYCOPYRROLATE 0.2 MG/ML IJ SOLN
INTRAMUSCULAR | Status: DC | PRN
  Administered 2014-11-13: 0.4 mg via INTRAVENOUS

## 2014-11-13 MED ORDER — GLYCOPYRROLATE 0.2 MG/ML IJ SOLN
INTRAMUSCULAR | Status: AC
  Filled 2014-11-13: qty 3

## 2014-11-13 MED ORDER — ACETAMINOPHEN 500 MG PO TABS
1000.0000 mg | ORAL_TABLET | Freq: Every evening | ORAL | Status: DC | PRN
  Filled 2014-11-13: qty 2

## 2014-11-13 MED ORDER — ONDANSETRON HCL 4 MG/2ML IJ SOLN
4.0000 mg | Freq: Once | INTRAMUSCULAR | Status: AC
  Administered 2014-11-13: 4 mg via INTRAVENOUS

## 2014-11-13 MED ORDER — CEFAZOLIN SODIUM-DEXTROSE 2-3 GM-% IV SOLR
2.0000 g | INTRAVENOUS | Status: AC
  Administered 2014-11-13: 2 g via INTRAVENOUS
  Filled 2014-11-13: qty 50

## 2014-11-13 MED ORDER — NICOTINE 21 MG/24HR TD PT24
21.0000 mg | MEDICATED_PATCH | Freq: Every day | TRANSDERMAL | Status: DC
  Filled 2014-11-13 (×5): qty 1

## 2014-11-13 MED ORDER — HYDROMORPHONE HCL 1 MG/ML IJ SOLN
INTRAMUSCULAR | Status: AC
  Filled 2014-11-13: qty 1

## 2014-11-13 MED ORDER — SUCCINYLCHOLINE CHLORIDE 20 MG/ML IJ SOLN
INTRAMUSCULAR | Status: DC | PRN
  Administered 2014-11-13: 100 mg via INTRAVENOUS

## 2014-11-13 MED ORDER — SODIUM CHLORIDE 0.9 % IV BOLUS (SEPSIS): 1000.0000 mL | Freq: Once | INTRAVENOUS | Status: DC

## 2014-11-13 MED ORDER — NEOSTIGMINE METHYLSULFATE 10 MG/10ML IV SOLN
INTRAVENOUS | Status: DC | PRN
  Administered 2014-11-13: 3 mg via INTRAVENOUS

## 2014-11-13 MED ORDER — CETYLPYRIDINIUM CHLORIDE 0.05 % MT LIQD
7.0000 mL | Freq: Two times a day (BID) | OROMUCOSAL | Status: DC
  Administered 2014-11-13 – 2014-11-16 (×6): 7 mL via OROMUCOSAL

## 2014-11-13 MED ORDER — SODIUM CHLORIDE 0.9 % IV SOLN
1000.0000 mL | Freq: Once | INTRAVENOUS | Status: AC
  Administered 2014-11-13: 1000 mL via INTRAVENOUS

## 2014-11-13 SURGICAL SUPPLY — 37 items
BIOPATCH RED 1 DISK 7.0 (GAUZE/BANDAGES/DRESSINGS) ×1 IMPLANT
BLADE SURG ROTATE 9660 (MISCELLANEOUS) ×1 IMPLANT
CANISTER SUCTION 2500CC (MISCELLANEOUS) ×2 IMPLANT
CHLORAPREP W/TINT 26ML (MISCELLANEOUS) ×2 IMPLANT
COVER SURGICAL LIGHT HANDLE (MISCELLANEOUS) ×2 IMPLANT
DRAIN CHANNEL 19F RND (DRAIN) ×1 IMPLANT
DRAPE LAPAROSCOPIC ABDOMINAL (DRAPES) ×2 IMPLANT
DRSG OPSITE POSTOP 4X8 (GAUZE/BANDAGES/DRESSINGS) ×1 IMPLANT
ELECT CAUTERY BLADE 6.4 (BLADE) ×2 IMPLANT
ELECT REM PT RETURN 9FT ADLT (ELECTROSURGICAL) ×2
ELECTRODE REM PT RTRN 9FT ADLT (ELECTROSURGICAL) ×1 IMPLANT
EVACUATOR SILICONE 100CC (DRAIN) ×2 IMPLANT
GLOVE BIO SURGEON STRL SZ7 (GLOVE) ×2 IMPLANT
GLOVE BIO SURGEON STRL SZ8 (GLOVE) ×1 IMPLANT
GLOVE BIOGEL PI IND STRL 6.5 (GLOVE) ×2 IMPLANT
GLOVE BIOGEL PI IND STRL 7.0 (GLOVE) ×2 IMPLANT
GLOVE BIOGEL PI IND STRL 7.5 (GLOVE) ×1 IMPLANT
GLOVE BIOGEL PI INDICATOR 6.5 (GLOVE) ×2
GLOVE BIOGEL PI INDICATOR 7.0 (GLOVE) ×2
GLOVE BIOGEL PI INDICATOR 7.5 (GLOVE) ×3
GLOVE ECLIPSE 6.5 STRL STRAW (GLOVE) ×1 IMPLANT
GOWN STRL REUS W/ TWL LRG LVL3 (GOWN DISPOSABLE) ×3 IMPLANT
GOWN STRL REUS W/TWL LRG LVL3 (GOWN DISPOSABLE) ×10
GRAFT FLEX HD 8X16 THICK (Tissue Mesh) ×1 IMPLANT
KIT BASIN OR (CUSTOM PROCEDURE TRAY) ×2 IMPLANT
KIT ROOM TURNOVER OR (KITS) ×2 IMPLANT
NS IRRIG 1000ML POUR BTL (IV SOLUTION) ×3 IMPLANT
PACK GENERAL/GYN (CUSTOM PROCEDURE TRAY) ×2 IMPLANT
PAD ARMBOARD 7.5X6 YLW CONV (MISCELLANEOUS) ×2 IMPLANT
STAPLER VISISTAT 35W (STAPLE) ×1 IMPLANT
SUCTION POOLE TIP (SUCTIONS) ×1 IMPLANT
SUT ETHILON 2 0 FS 18 (SUTURE) ×2 IMPLANT
SUT NOVA NAB DX-16 0-1 5-0 T12 (SUTURE) ×7 IMPLANT
SUT VIC AB 2-0 CT1 18 (SUTURE) ×1 IMPLANT
SUT VIC AB 2-0 SH 18 (SUTURE) ×2 IMPLANT
TOWEL OR 17X26 10 PK STRL BLUE (TOWEL DISPOSABLE) ×2 IMPLANT
TRAY FOLEY CATH 14FRSI W/METER (CATHETERS) ×2 IMPLANT

## 2014-11-13 NOTE — Progress Notes (Signed)
The patient has undergone ex lap for incarcerated hernia and will be transferred from the hospitalist service to the surgical service.   Debbe Odea, MD

## 2014-11-13 NOTE — Consult Note (Signed)
I have been requested to see the patient for surgical clearance due to thrombocytopenia. This patient had recent thoracic surgery T12 compression fracture, performed by Dr. Ellene Route last in July 2015. Last year, her platelet count was ranging between 100,000 to 120,000 at the time of surgery. She did not develop bleeding complication at the time. I will not be able to see the patient until later today. I noticed that her platelet count yesterday was 122,000 and do not feel there is any contraindication for her to proceed with surgery with that platelet count. I have given verbal clearance from hematology standpoint to the nurse practitioner that is working with general surgery team. I will see the patient later today for official consult note

## 2014-11-13 NOTE — Anesthesia Procedure Notes (Signed)
Procedure Name: Intubation Date/Time: 11/13/2014 10:00 AM Performed by: Susa Loffler Pre-anesthesia Checklist: Patient identified, Timeout performed, Emergency Drugs available, Suction available and Patient being monitored Patient Re-evaluated:Patient Re-evaluated prior to inductionOxygen Delivery Method: Circle system utilized Preoxygenation: Pre-oxygenation with 100% oxygen Intubation Type: IV induction, Rapid sequence and Cricoid Pressure applied Laryngoscope Size: Glidescope and 3 Grade View: Grade I Tube type: Oral Tube size: 7.0 mm Number of attempts: 1 Airway Equipment and Method: Stylet and Video-laryngoscopy Placement Confirmation: ETT inserted through vocal cords under direct vision,  positive ETCO2 and breath sounds checked- equal and bilateral Secured at: 21 cm Tube secured with: Tape Dental Injury: Teeth and Oropharynx as per pre-operative assessment  Difficulty Due To: Difficulty was anticipated, Difficult Airway- due to anterior larynx and Difficult Airway- due to limited oral opening

## 2014-11-13 NOTE — ED Provider Notes (Signed)
CSN: [id number]     Arrival date & time 11/12/14  2301 History  This chart was scribed for Delora Fuel, MD by Randa Evens, ED Scribe. This patient was seen in room A01C/A01C and the patient's care was started at 12:22 AM.     Chief Complaint  Patient presents with  . Abdominal Pain   Patient is a 64 y.o. female presenting with abdominal pain. The history is provided by the patient. No language interpreter was used.  Abdominal Pain Associated symptoms: constipation and vomiting   Associated symptoms: no diarrhea    HPI Comments: [name] is a 64 y.o. female with PSHx of laparotomy, hysterectomy and C-section x2 who presents to the Emergency Department complaining of severe abdominal pain onset 1 week prior. Pt rates the severity of her pain 10/10. Pt states she has associated constipation, vomiting that began today. Pt states that his last BM was 1 week ago. Pt states that she is taking a lot of percocet post back surgery and thinks her symptoms are from being on the pain medication for an extended amount of time. Pt states she has tried a laxative and miralax with no relief. Pt denies flatus or diarrhea.  Pt states she has a Hx of right sided umbilical hernia.     Past Medical History  Diagnosis Date  . Chest pain     denies  . Tachycardia     occ upon exersion, and when put on bp med  . HTN (hypertension)   . Dysrhythmia     occ tachycardia  . History of kidney stones   . GERD (gastroesophageal reflux disease)     occ  . Fracture of vertebra, compression   . ITP (idiopathic thrombocytopenic purpura) 08/29/2013  . Leukocytosis, unspecified 09/05/2013  . Back pain 09/29/2013  . Muscle cramp 11/16/2013  . Depression     states her current situation with activity limitations, she has a low spirit at times.   . Chronic kidney disease     passed stones spontaneously- 40 yrs. ago   . Arthritis     lumbar burst fx, osteoporosis  . Blood dyscrasia    Past Surgical History   Procedure Laterality Date  . Laparotomy Right 09    ovarian cyst  . Total abdominal hysterectomy w/ bilateral salpingoophorectomy  97  . Cesarean section      x2  . Abdominal hysterectomy    . Anterior lat lumbar fusion N/A 03/28/2014    Procedure: Anterolateral decompression of L1 fracture with posterior segmental stabilization;  Surgeon: Kristeen Miss, MD;  Location: [facility];  Service: Neurosurgery;  Laterality: N/A;  Anterolateral decompression of Lumbar One fracture with posterior segmental stabilization  . Chest tube insertion Left 03/28/2014    Procedure: CHEST TUBE INSERTION;  Surgeon: Ivin Poot, MD;  Location: [facility];  Service: Thoracic;  Laterality: Left;   Family History  Problem Relation Age of Onset  . Hypertension    . Heart attack    . Leukemia Paternal Uncle     leukemia   History  Substance Use Topics  . Smoking status: Current Every Day Smoker -- 0.50 packs/day for 23 years    Types: Cigarettes  . Smokeless tobacco: Never Used  . Alcohol Use: No   OB History    No data available     Review of Systems  Gastrointestinal: Positive for vomiting, abdominal pain and constipation. Negative for diarrhea.  All other systems reviewed and are negative.  Allergies  Review of patient's allergies indicates no known allergies.  Home Medications   Prior to Admission medications   Medication Sig Start Date End Date Taking? Authorizing Provider  acetaminophen (TYLENOL) 500 MG tablet Take 1,000 mg by mouth every 6 (six) hours as needed for moderate pain.    Historical Provider, MD  Calcium Carbonate (OS-CAL PO) Take 1 tablet by mouth 2 (two) times daily.    Historical Provider, MD  diphenhydramine-acetaminophen (TYLENOL PM) 25-500 MG TABS Take 2 tablets by mouth at bedtime as needed.    Historical Provider, MD  folic acid (FOLVITE) 1 MG tablet Take 1 tablet (1 mg total) by mouth daily. 09/14/13   Heath Lark, MD  methocarbamol (ROBAXIN) 500 MG tablet Take 1  tablet (500 mg total) by mouth every 6 (six) hours as needed for muscle spasms. 04/04/14   Kristeen Miss, MD  metoprolol succinate (TOPROL-XL) 50 MG 24 hr tablet Take 50 mg by mouth daily after supper.  08/18/13   Historical Provider, MD  morphine (MSIR) 15 MG tablet Take 1 tablet (15 mg total) by mouth every 6 (six) hours as needed for severe pain. 03/10/14   Heath Lark, MD  oxyCODONE-acetaminophen (PERCOCET/ROXICET) 5-325 MG per tablet Take 1-2 tablets by mouth every 4 (four) hours as needed for moderate pain. 04/04/14   Kristeen Miss, MD  ranitidine (ZANTAC) 150 MG tablet Take 150 mg by mouth 2 (two) times daily.    Historical Provider, MD  vitamin B-12 (CYANOCOBALAMIN) 1000 MCG tablet Take 1 tablet (1,000 mcg total) by mouth daily. 09/14/13   Ni Gorsuch, MD   BP 146/90 mmHg  Pulse 112  Temp(Src) 97.6 F (36.4 C) (Oral)  Resp 20  Ht 4\' 10"  (1.473 m)  Wt 164 lb (74.39 kg)  BMI 34.29 kg/m2  SpO2 97%   Physical Exam  Constitutional: She is oriented to person, place, and time. She appears well-developed and well-nourished. No distress.  Appears uncomfortable.   HENT:  Head: Normocephalic and atraumatic.  Eyes: Conjunctivae and EOM are normal. Pupils are equal, round, and reactive to light.  subconjunctival hemorrhage medial aspect of left eye.   Neck: Normal range of motion. Neck supple. No JVD present.  Cardiovascular: Normal rate, regular rhythm and normal heart sounds.   No murmur heard. Pulmonary/Chest: Effort normal and breath sounds normal. She has no wheezes. She has no rales.  Abdominal: She exhibits no distension. Bowel sounds are decreased. There is tenderness. There is no rebound and no guarding.  Diffusely tender, moderately large abdominal wall hernia RLQ easily reducible.  Musculoskeletal: Normal range of motion. She exhibits no edema.  Lymphadenopathy:    She has no cervical adenopathy.  Neurological: She is alert and oriented to person, place, and time. No cranial nerve  deficit. Coordination normal.  Skin: Skin is warm and dry. No rash noted.  Psychiatric: She has a normal mood and affect. Her behavior is normal. Judgment and thought content normal.  Nursing note and vitals reviewed.   ED Course  Procedures (including critical care time) DIAGNOSTIC STUDIES: Oxygen Saturation is 97% on RA, normal by my interpretation.    COORDINATION OF CARE: 12:44 AM-Discussed treatment plan with pt at bedside and pt agreed to plan.     Labs Review Results for orders placed or performed during the hospital encounter of 11/13/14  CBC with Differential  Result Value Ref Range   WBC 30.5 (H) 4.0 - 10.5 K/uL   RBC 5.52 (H) 3.87 - 5.11 MIL/uL   Hemoglobin  16.7 (H) 12.0 - 15.0 g/dL   HCT 47.3 (H) 36.0 - 46.0 %   MCV 85.7 78.0 - 100.0 fL   MCH 30.3 26.0 - 34.0 pg   MCHC 35.3 30.0 - 36.0 g/dL   RDW 13.7 11.5 - 15.5 %   Platelets 122 (L) 150 - 400 K/uL   Neutrophils Relative % 90 (H) 43 - 77 %   Lymphocytes Relative 5 (L) 12 - 46 %   Monocytes Relative 5 3 - 12 %   Eosinophils Relative 0 0 - 5 %   Basophils Relative 0 0 - 1 %   Neutro Abs 27.5 (H) 1.7 - 7.7 K/uL   Lymphs Abs 1.5 0.7 - 4.0 K/uL   Monocytes Absolute 1.5 (H) 0.1 - 1.0 K/uL   Eosinophils Absolute 0.0 0.0 - 0.7 K/uL   Basophils Absolute 0.0 0.0 - 0.1 K/uL   Smear Review MORPHOLOGY UNREMARKABLE   Comprehensive metabolic panel  Result Value Ref Range   Sodium 137 135 - 145 mmol/L   Potassium 4.0 3.5 - 5.1 mmol/L   Chloride 98 96 - 112 mmol/L   CO2 23 19 - 32 mmol/L   Glucose, Bld 218 (H) 70 - 99 mg/dL   BUN 18 6 - 23 mg/dL   Creatinine, Ser 0.82 0.50 - 1.10 mg/dL   Calcium 10.0 8.4 - 10.5 mg/dL   Total Protein 7.3 6.0 - 8.3 g/dL   Albumin 5.1 3.5 - 5.2 g/dL   AST 30 0 - 37 U/L   ALT 62 (H) 0 - 35 U/L   Alkaline Phosphatase 133 (H) 39 - 117 U/L   Total Bilirubin 1.1 0.3 - 1.2 mg/dL   GFR calc non Af Amer 75 (L) >90 mL/min   GFR calc Af Amer 87 (L) >90 mL/min   Anion gap 16 (H) 5 - 15   Lipase, blood  Result Value Ref Range   Lipase 28 11 - 59 U/L  Urinalysis, Routine w reflex microscopic  Result Value Ref Range   Color, Urine AMBER (A) YELLOW   APPearance CLEAR CLEAR   Specific Gravity, Urine 1.033 (H) 1.005 - 1.030   pH 5.0 5.0 - 8.0   Glucose, UA NEGATIVE NEGATIVE mg/dL   Hgb urine dipstick NEGATIVE NEGATIVE   Bilirubin Urine SMALL (A) NEGATIVE   Ketones, ur 40 (A) NEGATIVE mg/dL   Protein, ur 30 (A) NEGATIVE mg/dL   Urobilinogen, UA 0.2 0.0 - 1.0 mg/dL   Nitrite NEGATIVE NEGATIVE   Leukocytes, UA NEGATIVE NEGATIVE  Urine microscopic-add on  Result Value Ref Range   Squamous Epithelial / LPF RARE RARE   Bacteria, UA RARE RARE   Urine-Other MUCOUS PRESENT   I-Stat CG4 Lactic Acid, ED  Result Value Ref Range   Lactic Acid, Venous 3.01 (HH) 0.5 - 2.0 mmol/L   Comment NOTIFIED PHYSICIAN    Imaging Review Ct Abdomen Pelvis W Contrast  11/13/2014   CLINICAL DATA:  Severe constipation.  EXAM: CT ABDOMEN AND PELVIS WITH CONTRAST  TECHNIQUE: Multidetector CT imaging of the abdomen and pelvis was performed using the standard protocol following bolus administration of intravenous contrast.  CONTRAST:  40mL OMNIPAQUE IOHEXOL 300 MG/ML SOLN, 167mL OMNIPAQUE IOHEXOL 300 MG/ML SOLN  COMPARISON:  09/28/2013  FINDINGS: BODY WALL: Massive low abdominal wall hernia, as discussed below.  LOWER CHEST: Bibasilar atelectasis. There is a Bochdalek's hernia containing fat on the left.  ABDOMEN/PELVIS:  Liver: Stable hemangioma in the posterior segment right liver measuring 55 x 35 mm. The  hemangioma has contracted since 2009 MRI.  Biliary: Numerous gallstones. No biliary obstruction or inflammation.  Pancreas: Unremarkable.  Spleen: Unremarkable.  Adrenals: Unremarkable.  Kidneys and ureters: No hydronephrosis or stone.  Bladder: Unremarkable.  Reproductive: Hysterectomy and presumed oophorectomies.  Bowel: There is dilated small bowel, maximal before it enters a large low abdominal wall  hernia. Bowel beyond the herniated segments are relatively decompressed. There is mesenteric congestion in the associated mesenteric fat which is mild but new. No evidence of bowel necrosis or perforation. There is a moderate volume of colonic stool. Normal appendix.  Retroperitoneum: No mass or adenopathy.  Peritoneum: No ascites or pneumoperitoneum.  Vascular: Prominent tortuosity of the infrarenal aorta, with to 90 degree curves in the upper abdomen.  OSSEOUS: L1 corpectomy with strut graft. There has been progressive subsidence especially into the T12 vertebra where there is lucency around the upper graft. There is doubtful bony fusion across the discectomy level. The inferior portion of the graft continues to the L2 inferior endplate, as prior. No acute fracture. No progressive retropulsion.  IMPRESSION: 1. Partial small bowel obstruction and mesenteric congestion from a large low abdominal wall hernia. 2. L1 corpectomy with strut graft. There is prominent subsidence into the T12 vertebral body with doubtful osseous fusion across the operative level. 3. Cholelithiasis and other chronic findings are noted above.   Electronically Signed   By: Monte Fantasia M.D.   On: 11/13/2014 03:50   Images viewed by me.   EKG Interpretation   Date/Time:  Monday November 13 2014 01:23:42 EST Ventricular Rate:  133 PR Interval:  137 QRS Duration: 86 QT Interval:  308 QTC Calculation: 458 R Axis:   -2 Text Interpretation:  Sinus tachycardia Borderline T abnormalities,  lateral leads When compared with ECG of 03/28/2014, HEART RATE has  increased Confirmed by [facility]  MD, Quaneshia Wareing ([id number]) on 11/13/2014 1:27:36 AM      MDM   Final diagnoses:  Abdominal pain, unspecified abdominal location  SBO (small bowel obstruction)  Abdominal wall hernia  ITP (idiopathic thrombocytopenic purpura)  Elevated lactic acid level  Leukocytosis      Abdominal pain with vomiting worrisome for small bowel obstruction. She has  a large abdominal wall hernia which is reducible but she experiences significant pain when it is being reduced. No evidence of incarcerated hernia. She is given aggressive IV hydration. Lactic acid level has come back somewhat elevated at 3. She was given hydromorphone for pain and ondansetron for nausea with significant improvement. WBC has come back very high at 30,000 with left shift. Review of past records shows that she has had severe leukocytosis in the past as high as 27,000. She does have ITP, blood platelet count is actually higher than any recent values at 122,000. She is sent for CT of abdomen and pelvis confirming partial small bowel obstruction with transition point coinciding with her abdominal wall hernia. Case is discussed with Dr. Blaine Hamper of [facility] who agrees to admit the patient. Lactic acid level is being repeated following IV hydration. Case is also discussed with Dr. Grandville Silos of [facility] surgery who will see the patient in consultation.   I personally performed the services described in this documentation, which was scribed in my presence. The recorded information has been reviewed and is accurate.       Delora Fuel, MD 89/16/94 [id number]

## 2014-11-13 NOTE — Transfer of Care (Signed)
Immediate Anesthesia Transfer of Care Note  Patient: [name]  Procedure(s) Performed: Procedure(s): PRIMARY REPAIR INCARCERATED INCISIONAL HERNIA WITH PLACEMENT BIOLOGIC MESH (N/A)  Patient Location: PACU  Anesthesia Type:General  Level of Consciousness: awake, alert  and oriented  Airway & Oxygen Therapy: Patient Spontanous Breathing and Patient connected to nasal cannula oxygen  Post-op Assessment: Report given to RN and Post -op Vital signs reviewed and stable  Post vital signs: Reviewed and stable  Last Vitals:  Filed Vitals:   11/13/14 0633  BP: 106/91  Pulse: 122  Temp: 37.7 C  Resp: 20    Complications: No apparent anesthesia complications

## 2014-11-13 NOTE — Progress Notes (Signed)
Patient ID: [name], female   DOB: May 16, 1951, 64 y.o.   MRN: [id number]     [address] [place], [place] [address]    Phone: [phone] FAX: [phone]     Subjective: Pain and nausea, "feels like my heart is racing."  No flatus.  Afebrile.    Objective:  Vital signs:  Filed Vitals:   11/13/14 0400 11/13/14 0415 11/13/14 0500 11/13/14 0633  BP: 130/67  112/66 106/91  Pulse: 118  124 122  Temp:    99.9 F (37.7 C)  TempSrc:    Oral  Resp: _0 Height:      Weight:    169 lb 12.8 oz (77.021 kg)  SpO2: 89% 90% 90% 90%     Physical Exam: General: Pt awake/alert/oriented x4 in mild acute distress Chest: cta. No chest wall pain w good excursion CV:  Pulses intact.  Regular rhythm Abdomen: Soft.  Nondistended.  ttp periumbilical region and lower abdomen, hernia.   No evidence of peritonitis. Ext:  SCDs BLE.  No mjr edema.  No cyanosis Skin: No petechiae / purpura   Problem List:   Principal Problem:   SOB (shortness of breath) Active Problems:   HTN (hypertension)   ITP (idiopathic thrombocytopenic purpura)   Tobacco abuse   Leukocytosis   Abdominal pain   Sepsis   GERD (gastroesophageal reflux disease)   Constipation    Results:   Labs: Results for orders placed or performed during the hospital encounter of 11/13/14 (from the past 48 hour(s))  CBC with Differential     Status: Abnormal   Collection Time: 11/12/14 11:16 PM  Result Value Ref Range   WBC 30.5 (H) 4.0 - 10.5 K/uL   RBC 5.52 (H) 3.87 - 5.11 MIL/uL   Hemoglobin 16.7 (H) 12.0 - 15.0 g/dL   HCT 47.3 (H) 36.0 - 46.0 %   MCV 85.7 78.0 - 100.0 fL   MCH 30.3 26.0 - 34.0 pg   MCHC 35.3 30.0 - 36.0 g/dL   RDW 13.7 11.5 - 15.5 %   Platelets 122 (L) 150 - 400 K/uL   Neutrophils Relative % 90 (H) 43 - 77 %   Lymphocytes Relative 5 (L) 12 - 46 %   Monocytes Relative 5 3 - 12 %   Eosinophils Relative 0 0 - 5 %   Basophils Relative 0 0 - 1 %   Neutro Abs 27.5 (H) 1.7 - 7.7 K/uL   Lymphs Abs 1.5 0.7 - 4.0 K/uL   Monocytes Absolute 1.5 (H) 0.1 - 1.0 K/uL   Eosinophils Absolute 0.0 0.0 - 0.7 K/uL   Basophils Absolute 0.0 0.0 - 0.1 K/uL   Smear Review MORPHOLOGY UNREMARKABLE   Comprehensive metabolic panel     Status: Abnormal   Collection Time: 11/12/14 11:16 PM  Result Value Ref Range   Sodium 137 135 - 145 mmol/L   Potassium 4.0 3.5 - 5.1 mmol/L   Chloride 98 96 - 112 mmol/L   CO2 23 19 - 32 mmol/L   Glucose, Bld 218 (H) 70 - 99 mg/dL   BUN 18 6 - 23 mg/dL   Creatinine, Ser 0.82 0.50 - 1.10 mg/dL   Calcium 10.0 8.4 - 10.5 mg/dL   Total Protein 7.3 6.0 - 8.3 g/dL   Albumin 5.1 3.5 - 5.2 g/dL   AST 30 0 - 37 U/L   ALT 62 (H) 0 -  35 U/L   Alkaline Phosphatase 133 (H) 39 - 117 U/L   Total Bilirubin 1.1 0.3 - 1.2 mg/dL   GFR calc non Af Amer 75 (L) >90 mL/min   GFR calc Af Amer 87 (L) >90 mL/min    Comment: (NOTE) The eGFR has been calculated using the CKD EPI equation. This calculation has not been validated in all clinical situations. eGFR's persistently <90 mL/min signify possible Chronic Kidney Disease.    Anion gap 16 (H) 5 - 15  Lipase, blood     Status: None   Collection Time: 11/12/14 11:16 PM  Result Value Ref Range   Lipase 28 11 - 59 U/L  I-Stat CG4 Lactic Acid, ED     Status: Abnormal   Collection Time: 11/13/14  1:06 AM  Result Value Ref Range   Lactic Acid, Venous 3.01 (HH) 0.5 - 2.0 mmol/L   Comment NOTIFIED PHYSICIAN   Urinalysis, Routine w reflex microscopic     Status: Abnormal   Collection Time: 11/13/14  2:33 AM  Result Value Ref Range   Color, Urine AMBER (A) YELLOW    Comment: BIOCHEMICALS MAY BE AFFECTED BY COLOR   APPearance CLEAR CLEAR   Specific Gravity, Urine 1.033 (H) 1.005 - 1.030   pH 5.0 5.0 - 8.0   Glucose, UA NEGATIVE NEGATIVE mg/dL   Hgb urine dipstick NEGATIVE NEGATIVE   Bilirubin Urine SMALL (A) NEGATIVE   Ketones, ur 40 (A) NEGATIVE mg/dL    Protein, ur 30 (A) NEGATIVE mg/dL   Urobilinogen, UA 0.2 0.0 - 1.0 mg/dL   Nitrite NEGATIVE NEGATIVE   Leukocytes, UA NEGATIVE NEGATIVE  Urine microscopic-add on     Status: None   Collection Time: 11/13/14  2:33 AM  Result Value Ref Range   Squamous Epithelial / LPF RARE RARE   Bacteria, UA RARE RARE   Urine-Other MUCOUS PRESENT   I-Stat CG4 Lactic Acid, ED     Status: None   Collection Time: 11/13/14  4:48 AM  Result Value Ref Range   Lactic Acid, Venous 1.44 0.5 - 2.0 mmol/L  Lactic acid, plasma     Status: None   Collection Time: 11/13/14  7:10 AM  Result Value Ref Range   Lactic Acid, Venous 1.1 0.5 - 2.0 mmol/L  Glucose, capillary     Status: Abnormal   Collection Time: 11/13/14  8:19 AM  Result Value Ref Range   Glucose-Capillary 122 (H) 70 - 99 mg/dL    Imaging / Studies: Ct Abdomen Pelvis W Contrast  11/13/2014   CLINICAL DATA:  Severe constipation.  EXAM: CT ABDOMEN AND PELVIS WITH CONTRAST  TECHNIQUE: Multidetector CT imaging of the abdomen and pelvis was performed using the standard protocol following bolus administration of intravenous contrast.  CONTRAST:  71m OMNIPAQUE IOHEXOL 300 MG/ML SOLN, 1021mOMNIPAQUE IOHEXOL 300 MG/ML SOLN  COMPARISON:  09/28/2013  FINDINGS: BODY WALL: Massive low abdominal wall hernia, as discussed below.  LOWER CHEST: Bibasilar atelectasis. There is a Bochdalek's hernia containing fat on the left.  ABDOMEN/PELVIS:  Liver: Stable hemangioma in the posterior segment right liver measuring 55 x 35 mm. The hemangioma has contracted since 2009 MRI.  Biliary: Numerous gallstones. No biliary obstruction or inflammation.  Pancreas: Unremarkable.  Spleen: Unremarkable.  Adrenals: Unremarkable.  Kidneys and ureters: No hydronephrosis or stone.  Bladder: Unremarkable.  Reproductive: Hysterectomy and presumed oophorectomies.  Bowel: There is dilated small bowel, maximal before it enters a large low abdominal wall hernia. Bowel beyond the herniated segments are  relatively decompressed. There is mesenteric congestion in the associated mesenteric fat which is mild but new. No evidence of bowel necrosis or perforation. There is a moderate volume of colonic stool. Normal appendix.  Retroperitoneum: No mass or adenopathy.  Peritoneum: No ascites or pneumoperitoneum.  Vascular: Prominent tortuosity of the infrarenal aorta, with to 90 degree curves in the upper abdomen.  OSSEOUS: L1 corpectomy with strut graft. There has been progressive subsidence especially into the T12 vertebra where there is lucency around the upper graft. There is doubtful bony fusion across the discectomy level. The inferior portion of the graft continues to the L2 inferior endplate, as prior. No acute fracture. No progressive retropulsion.  IMPRESSION: 1. Partial small bowel obstruction and mesenteric congestion from a large low abdominal wall hernia. 2. L1 corpectomy with strut graft. There is prominent subsidence into the T12 vertebral body with doubtful osseous fusion across the operative level. 3. Cholelithiasis and other chronic findings are noted above.   Electronically Signed   By: Monte Fantasia M.D.   On: 11/13/2014 03:50    Medications / Allergies:  Scheduled Meds: . sodium chloride   Intravenous STAT  . antiseptic oral rinse  7 mL Mouth Rinse BID  . metoprolol succinate  50 mg Oral QPC supper  . nicotine  21 mg Transdermal Daily  . [START ON 11/14/2014] pantoprazole (PROTONIX) IV  40 mg Intravenous Q24H  . sodium chloride  1,000 mL Intravenous Once  . sodium chloride  3 mL Intravenous Q12H   Continuous Infusions: . sodium chloride 1,000 mL (11/13/14 0420)   PRN Meds:.acetaminophen, diphenhydrAMINE **AND** acetaminophen, HYDROmorphone (DILAUDID) injection, methocarbamol, morphine, ondansetron (ZOFRAN) IV, oxyCODONE-acetaminophen, polyethylene glycol  Antibiotics: Anti-infectives    None        Assessment/Plan Incisional hernia with obstruction -to surgery today.  Dr.  Alvy Bimler cleared the patient. -NPO -IVF -SCD, hold chemical VTE prophylaxis -pain control/anti/emetics   Erby Pian, ANP-BC [facility] Pager [phone](7A-4:30P) For consults and floor pages call [phone](7A-4:30P)  11/13/2014 8:39 AM

## 2014-11-13 NOTE — Anesthesia Postprocedure Evaluation (Signed)
  Anesthesia Post-op Note  Patient: [name]  Procedure(s) Performed: Procedure(s): PRIMARY REPAIR INCARCERATED INCISIONAL HERNIA WITH PLACEMENT BIOLOGIC MESH (N/A)  Patient Location: PACU  Anesthesia Type: General   Level of Consciousness: awake, alert  and oriented  Airway and Oxygen Therapy: Patient Spontanous Breathing  Post-op Pain: mild  Post-op Assessment: Post-op Vital signs reviewed  Post-op Vital Signs: Reviewed  Last Vitals:  Filed Vitals:   11/13/14 1746  BP: 128/70  Pulse: 121  Temp: 37.7 C  Resp: 18    Complications: No apparent anesthesia complications

## 2014-11-13 NOTE — Op Note (Signed)
Preoperative diagnosis: Incarcerated incisional hernia with bowel obstruction Postoperative diagnosis: Same as above Procedure: #1 expiratory laparotomy with reduction of hernia #2 primary repair of incisional hernia with biologic mesh onlay Surgeon: Dr. Serita Grammes Asst.: Saverio Danker Anesthesia: Gen. Estimated blood loss: Minimal Drains: 51 French Blake drain to hernia sac Specimens: None Complications: None Sponge and needle count was correct at completion Disposition to recovery in stable condition  Indications: This is a 64 year old female presents with an incarcerated hernia, tachycardia, elevated white blood cell count, a CT scan and exam consistent with a bowel obstruction due to an incarcerated hernia. I discussed with her going to the operating room urgently for a laparotomy.  Procedure: After informed consent was obtained the patient was taken to the operating room. She was given cefazolin. Sequential compression devices were on her legs. She was placed under general anesthesia without complication. Her abdomen was prepped and draped in the standard sterile surgical fashion. A surgical timeout was then performed.  I made a vertical midline incision and carried this out through the fascia. I entered the fascia sharply near the umbilicus. This was done without injury. I then proceeded to enter the abdomen for the entirety of the incision. She had some adhesions of her omentum that were taken down sharply. I was unable to identify her hernia and I was able to reduce the bowel fairly easily from the hernia. There clearly was a bowel obstruction related to this. Some of her bowel was injected and a little bit dusky. After I released this the bowel all appeared pink. All of her bowel was viable. I ran her small bowel from the ligament of Treitz to the ileocecal valve and it was all viable. There was a fair amount of murky fluid present in the sac. There was a fair amount of mucus as well.  I elected to fix this primarily due to my concern for an infection. I did place a 64 [place] Blake drain and a very large hernia sac and secured this with a 2-0 nylon suture. I then closed the hernia primarily with #1 Novafil suture. I buttressed this with a piece of Flex HD which was sewn into place. I then irrigated. Hemostasis was observed. I then closed the abdomen with #1 Novafil suture. I then stapled the incision. She tolerated this well. She was activated and transferred to recovery.

## 2014-11-13 NOTE — Progress Notes (Signed)
  2/29 Admitted with bowel obstruction-hernia. In OR now.-lab glucose of 218 mg/dL last HS.No note of hx of dm or dm home meds. Due to ITP, an A1C would most probably not be accurate. Please consider checking blood sugars temporarily and use sensitive correction tidwc if needed.  Thank you Rosita Kea, RN, MSN, CDE  [facility] Office: [phone] Pager: [phone] 8:00 am to 5:00 pm

## 2014-11-13 NOTE — ED Notes (Signed)
CG-4 reported to Dr. Glick 

## 2014-11-13 NOTE — Anesthesia Preprocedure Evaluation (Addendum)
Anesthesia Evaluation  Patient identified by MRN, date of birth, ID band Patient awake    Reviewed: Allergy & Precautions, H&P , NPO status , Patient's Chart, lab work & pertinent test results, reviewed documented beta blocker date and time   Airway Mallampati: III  TM Distance: >3 FB Neck ROM: Full    Dental no notable dental hx. (+) Teeth Intact, Dental Advisory Given   Pulmonary Current Smoker,  breath sounds clear to auscultation  Pulmonary exam normal       Cardiovascular hypertension, Pt. on medications and Pt. on home beta blockers Rhythm:Regular Rate:Tachycardia     Neuro/Psych negative neurological ROS     GI/Hepatic Neg liver ROS, GERD-  Medicated and Controlled,  Endo/Other  negative endocrine ROS  Renal/GU Renal InsufficiencyRenal disease  negative genitourinary   Musculoskeletal  (+) Arthritis -, Osteoarthritis,    Abdominal   Peds  Hematology negative hematology ROS (+)   Anesthesia Other Findings   Reproductive/Obstetrics negative OB ROS                           Anesthesia Physical Anesthesia Plan  ASA: III  Anesthesia Plan: General   Post-op Pain Management:    Induction: Intravenous, Rapid sequence and Cricoid pressure planned  Airway Management Planned: Oral ETT and Video Laryngoscope Planned  Additional Equipment:   Intra-op Plan:   Post-operative Plan: Extubation in OR  Informed Consent: I have reviewed the patients History and Physical, chart, labs and discussed the procedure including the risks, benefits and alternatives for the proposed anesthesia with the patient or authorized representative who has indicated his/her understanding and acceptance.   Dental advisory given  Plan Discussed with: CRNA  Anesthesia Plan Comments:       Anesthesia Quick Evaluation

## 2014-11-13 NOTE — H&P (Signed)
[facility] History and Physical  [name] FUX:[id number] DOB: 1951/05/21 DOA: 11/13/2014  Referring physician: ED physician PCP: Imelda Pillow, NP  Specialists:   Chief Complaint:  Abdominal pain  HPI: [name] is a 64 y.o. female with past medical history of hypertension, GERD, ITP, hysterectomy, C-section x 2, chronic leukocytosis, who presents with abdominal pain.  Patient repots that her abdominal pain started one week ago. It has been progressively getting worse. Her abdominal pain is diffuse, constant, 10 out of 10 in severity. She also has nausea and vomiting. She vomited 4-5 times today. It is associated with severe constipation. She did not have a bowel movement for about a week. Pt states that she is taking a lot of percocet post back surgery. Pt states she has tried a laxative and miralax with no relief. She also reports having severe acid reflux symptoms and heartburn. Patient denies fever, chills, cough, chest pain, SOB, dysuria, urgency, frequency, hematuria, skin rashes, joint pain or leg swelling.   In ED, patient was found to have partial small bowel obstruction and mesenteric congestion from a large low abdominal wall hernia on CT abdomen/pelvis; lipase 28, lactate 3.01, normal temperature, tachycardia, worsening leukocytosis, stable platelets. Patient is admitted to inpatient for further evaluation and treatment.  Review of Systems: As presented in the history of presenting illness, rest negative.  Where does patient live? At home  Can patient participate in ADLs? Yes  Allergy: No Known Allergies  Past Medical History  Diagnosis Date  . Chest pain     denies  . Tachycardia     occ upon exersion, and when put on bp med  . HTN (hypertension)   . Dysrhythmia     occ tachycardia  . History of kidney stones   . GERD (gastroesophageal reflux disease)     occ  . Fracture of vertebra, compression   . ITP (idiopathic thrombocytopenic purpura)  08/29/2013  . Leukocytosis, unspecified 09/05/2013  . Back pain 09/29/2013  . Muscle cramp 11/16/2013  . Depression     states her current situation with activity limitations, she has a low spirit at times.   . Chronic kidney disease     passed stones spontaneously- 40 yrs. ago   . Arthritis     lumbar burst fx, osteoporosis  . Blood dyscrasia     Past Surgical History  Procedure Laterality Date  . Laparotomy Right 09    ovarian cyst  . Total abdominal hysterectomy w/ bilateral salpingoophorectomy  97  . Cesarean section      x2  . Abdominal hysterectomy    . Anterior lat lumbar fusion N/A 03/28/2014    Procedure: Anterolateral decompression of L1 fracture with posterior segmental stabilization;  Surgeon: Kristeen Miss, MD;  Location: [facility];  Service: Neurosurgery;  Laterality: N/A;  Anterolateral decompression of Lumbar One fracture with posterior segmental stabilization  . Chest tube insertion Left 03/28/2014    Procedure: CHEST TUBE INSERTION;  Surgeon: Ivin Poot, MD;  Location: [facility];  Service: Thoracic;  Laterality: Left;    Social History:  reports that she has been smoking Cigarettes.  She has a 11.5 pack-year smoking history. She has never used smokeless tobacco. She reports that she does not drink alcohol or use illicit drugs.  Family History:  Family History  Problem Relation Age of Onset  . Hypertension    . Heart attack    . Leukemia Paternal Uncle     leukemia  Prior to Admission medications   Medication Sig Start Date End Date Taking? Authorizing Provider  acetaminophen (TYLENOL) 500 MG tablet Take 1,000 mg by mouth every 6 (six) hours as needed for moderate pain.   Yes Historical Provider, MD  diphenhydramine-acetaminophen (TYLENOL PM) 25-500 MG TABS Take 2 tablets by mouth at bedtime as needed.   Yes Historical Provider, MD  methocarbamol (ROBAXIN) 500 MG tablet Take 1 tablet (500 mg total) by mouth every 6 (six) hours as needed for muscle  spasms. 04/04/14  Yes Kristeen Miss, MD  metoprolol succinate (TOPROL-XL) 50 MG 24 hr tablet Take 50 mg by mouth daily after supper.  08/18/13  Yes Historical Provider, MD  oxyCODONE-acetaminophen (PERCOCET/ROXICET) 5-325 MG per tablet Take 1-2 tablets by mouth every 4 (four) hours as needed for moderate pain. 04/04/14  Yes Kristeen Miss, MD  ranitidine (ZANTAC) 150 MG tablet Take 150 mg by mouth 2 (two) times daily as needed for heartburn.    Yes Historical Provider, MD  folic acid (FOLVITE) 1 MG tablet Take 1 tablet (1 mg total) by mouth daily. Patient not taking: Reported on 11/13/2014 09/14/13   Heath Lark, MD  morphine (MSIR) 15 MG tablet Take 1 tablet (15 mg total) by mouth every 6 (six) hours as needed for severe pain. Patient not taking: Reported on 11/13/2014 03/10/14   Heath Lark, MD  vitamin B-12 (CYANOCOBALAMIN) 1000 MCG tablet Take 1 tablet (1,000 mcg total) by mouth daily. Patient not taking: Reported on 11/13/2014 09/14/13   Heath Lark, MD    Physical Exam: Filed Vitals:   11/13/14 0400 11/13/14 0415 11/13/14 0500 11/13/14 0633  BP: 130/67  112/66 106/91  Pulse: 118  124 122  Temp:    99.9 F (37.7 C)  TempSrc:    Oral  Resp: 20  21 20   Height:      Weight:    77.021 kg (169 lb 12.8 oz)  SpO2: 89% 90% 90% 90%   General:  in moderate acute distress due to pain HEENT:       Eyes: PERRL, EOMI, no scleral icterus       ENT: No discharge from the ears and nose, no pharynx injection, no tonsillar enlargement.        Neck: No JVD, no bruit, no mass felt. Cardiac: S1/S2, RRR, No murmurs, No gallops or rubs Pulm: Good air movement bilaterally. Clear to auscultation bilaterally. No rales, wheezing, rhonchi or rubs. Abd: distended, diffused tenderness, no rebound pain, no organomegaly, BS present Ext: No edema bilaterally. 2+DP/PT pulse bilaterally Musculoskeletal: No joint deformities, erythema, or stiffness, ROM full Skin: No rashes.  Neuro: Alert and oriented X3, cranial nerves  II-XII grossly intact, muscle strength 5/5 in all extremeties, sensation to light touch intact.  Psych: Patient is not psychotic, no suicidal or hemocidal ideation.  Labs on Admission:  Basic Metabolic Panel:  Recent Labs Lab 11/12/14 2316  NA 137  K 4.0  CL 98  CO2 23  GLUCOSE 218*  BUN 18  CREATININE 0.82  CALCIUM 10.0   Liver Function Tests:  Recent Labs Lab 11/12/14 2316  AST 30  ALT 62*  ALKPHOS 133*  BILITOT 1.1  PROT 7.3  ALBUMIN 5.1    Recent Labs Lab 11/12/14 2316  LIPASE 28   No results for input(s): AMMONIA in the last 168 hours. CBC:  Recent Labs Lab 11/12/14 2316  WBC 30.5*  NEUTROABS 27.5*  HGB 16.7*  HCT 47.3*  MCV 85.7  PLT 122*   Cardiac Enzymes:  No results for input(s): CKTOTAL, CKMB, CKMBINDEX, TROPONINI in the last 168 hours.  BNP (last 3 results) No results for input(s): BNP in the last 8760 hours.  ProBNP (last 3 results) No results for input(s): PROBNP in the last 8760 hours.  CBG: No results for input(s): GLUCAP in the last 168 hours.  Radiological Exams on Admission: Ct Abdomen Pelvis W Contrast  11/13/2014   CLINICAL DATA:  Severe constipation.  EXAM: CT ABDOMEN AND PELVIS WITH CONTRAST  TECHNIQUE: Multidetector CT imaging of the abdomen and pelvis was performed using the standard protocol following bolus administration of intravenous contrast.  CONTRAST:  68mL OMNIPAQUE IOHEXOL 300 MG/ML SOLN, 146mL OMNIPAQUE IOHEXOL 300 MG/ML SOLN  COMPARISON:  09/28/2013  FINDINGS: BODY WALL: Massive low abdominal wall hernia, as discussed below.  LOWER CHEST: Bibasilar atelectasis. There is a Bochdalek's hernia containing fat on the left.  ABDOMEN/PELVIS:  Liver: Stable hemangioma in the posterior segment right liver measuring 55 x 35 mm. The hemangioma has contracted since 2009 MRI.  Biliary: Numerous gallstones. No biliary obstruction or inflammation.  Pancreas: Unremarkable.  Spleen: Unremarkable.  Adrenals: Unremarkable.  Kidneys and  ureters: No hydronephrosis or stone.  Bladder: Unremarkable.  Reproductive: Hysterectomy and presumed oophorectomies.  Bowel: There is dilated small bowel, maximal before it enters a large low abdominal wall hernia. Bowel beyond the herniated segments are relatively decompressed. There is mesenteric congestion in the associated mesenteric fat which is mild but new. No evidence of bowel necrosis or perforation. There is a moderate volume of colonic stool. Normal appendix.  Retroperitoneum: No mass or adenopathy.  Peritoneum: No ascites or pneumoperitoneum.  Vascular: Prominent tortuosity of the infrarenal aorta, with to 90 degree curves in the upper abdomen.  OSSEOUS: L1 corpectomy with strut graft. There has been progressive subsidence especially into the T12 vertebra where there is lucency around the upper graft. There is doubtful bony fusion across the discectomy level. The inferior portion of the graft continues to the L2 inferior endplate, as prior. No acute fracture. No progressive retropulsion.  IMPRESSION: 1. Partial small bowel obstruction and mesenteric congestion from a large low abdominal wall hernia. 2. L1 corpectomy with strut graft. There is prominent subsidence into the T12 vertebral body with doubtful osseous fusion across the operative level. 3. Cholelithiasis and other chronic findings are noted above.   Electronically Signed   By: Monte Fantasia M.D.   On: 11/13/2014 03:50    EKG: Independently reviewed.   Assessment/Plan Principal Problem:   SOB (shortness of breath) Active Problems:   HTN (hypertension)   ITP (idiopathic thrombocytopenic purpura)   Tobacco abuse   Leukocytosis   Abdominal pain   Sepsis   GERD (gastroesophageal reflux disease)   Constipation   SOB: Patient's abdominal pain, nausea and vomiting are most likely caused by partial small bowel obstruction as evidenced by CT scan. Currently patient is mildly septic with worsening leukocytosis and tachycardia. She  has chronic leukocytosis, but today's white count increased to 30 from previous 13 on 04/10/14. She is hemodynamically stable currently. Surgery was consulted by ED.  -Admit to the telemetry bed -will get Procalcitonin and trend lactic acid level -IVF: received 2L of NS bolus in ED, followed by 125 cc/h -blood culture -follow up surgeon's recommendation -Zofran for nausea and Dilaudid for abdominal pain  Constipation: -MiraLAX, if not effective, will do enema  GERD: Patient has severe acid reflux symptoms -start IV Protonix  ITP and chronic leukocytosis: She has been followed up by hematologist, Dr. Ernst Spell.  Last seen was 04/10/14. Per dr. Margrett Rud note, pt did not respond to rituximab. She was then treated with  Nplate with good response. Platelet number is better than baseline today. Leukocytosis has worsened because of sepsis. -treat sepsis as above -Follow-up with hematologist discharged  T12 compression fracture: s/p of surgical decompression.  -pain control: Tylenol, Robaxin, Percocet.  Tobacco abuse -did counseling the patient about the  the importance of tobacco cessation. -Nicotine patch  DVT ppx: SCD  Code Status: Full code Family Communication: None at bed side.      Disposition Plan: Admit to inpatient   Date of Service 11/13/2014    Ivor Costa [facility] Pager [phone]  If 7PM-7AM, please contact night-coverage [email or web address] Password [facility] 11/13/2014, 6:38 AM

## 2014-11-13 NOTE — Consult Note (Signed)
[facility]  Telephone:([phone]) [place]                                MR#: [id number]  DOB: [birth year]                        CSN#: [id number]  Patient Care Team: [name], NP as PCP - General Heath Lark, MD as Consulting Physician (Hematology and Oncology) Kristeen Miss, MD as Consulting Physician (Neurosurgery) Referring MD: [facility]   I have seen the patient, examined her and edited the notes as follows  Reason for Consult: Thrombocytopenia  [name] 64 y.o. female with a history of ITP,s/p Nplate therapy, admitted on 2/29 with significant abdominal pain and several day history of constipation, complicated with nausea and non-bloody emesis. She denied any respiratory or cardiac complaints. She denied any fever or chills. She denied any hematuria or dysuria. A CT of the abdomen and pelvis on 2/28 showed a partial bowel obstruction from a large low abdominal wall incarcerated hernia.  Plans for hernia repair were made for today.  Her platelet count is 122,000, within her baseline, so she proceeded with surgery. Patient is somnolent but no complications were noted. She had minimal blood loss.   Brief hematological history:  History of ITP This is a pleasant 64 year old lady with progressive thrombocytopenia since 2009. She also have her mild leukocytosis and erythrocytosis, likely secondary to smoking. She had back fracture and was unable to proceed with surgery due to thrombocytopenia and was subsequently found to have ITP. On 08/29/2013,she was started  on prednisone 60 mg daily for ITP On 09/14/2013, due to lack of response, prednisone was increased to 80 mg once a day and add X73 and folic acid supplement On 09/23/2013, she was noted to be refractory to prednisone.prednisone taper was started to 40 mg a day On 09/29/2013, prednisone dose was further reduced to 20 mg daily and then 10 mg  daily. On 09/30/2013, she was started on rituximab weekly, completed treatment by February 6th, 2015.(4 of 4 cycles) On 10/14/2013,steroid taper was continued to 5 mg daily and the patient completed steroid taper by mid-February 2015. On 03/09/2014, platelet count starting to decline and decision was made to start her on Nplate. She was placed on Nplate on 5/32/99 for a platelet count of 65,000, discontinued on 04/10/14 as she had good response to treatment She has been on observation since On 04/10/14 she was able to proceed with T12 compression fracture repair without complications or platelet transfusions. Her platelets at the time were between 100-120,000  PMH:  Past Medical History  Diagnosis Date  . Chest pain     denies  . Tachycardia     occ upon exersion, and when put on bp med  . HTN (hypertension)   . Dysrhythmia     occ tachycardia  . History of kidney stones   . GERD (gastroesophageal reflux disease)     occ  . Fracture of vertebra, compression   . ITP (idiopathic thrombocytopenic purpura) 08/29/2013  . Leukocytosis, unspecified 09/05/2013  . Back pain 09/29/2013  . Muscle cramp 11/16/2013  . Depression     states her current situation with activity limitations, she has a low spirit at times.   . Chronic kidney disease     passed stones spontaneously- 40 yrs.  ago   . Arthritis     lumbar burst fx, osteoporosis  . Blood dyscrasia     Surgeries:  Past Surgical History  Procedure Laterality Date  . Laparotomy Right 09    ovarian cyst  . Total abdominal hysterectomy w/ bilateral salpingoophorectomy  97  . Cesarean section      x2  . Abdominal hysterectomy    . Anterior lat lumbar fusion N/A 03/28/2014    Procedure: Anterolateral decompression of L1 fracture with posterior segmental stabilization;  Surgeon: Kristeen Miss, MD;  Location: [facility];  Service: Neurosurgery;  Laterality: N/A;  Anterolateral decompression of Lumbar One fracture with posterior segmental  stabilization  . Chest tube insertion Left 03/28/2014    Procedure: CHEST TUBE INSERTION;  Surgeon: Ivin Poot, MD;  Location: [facility];  Service: Thoracic;  Laterality: Left;    Allergies: No Known Allergies  Medications:    prior to admission:  Prescriptions prior to admission  Medication Sig Dispense Refill Last Dose  . acetaminophen (TYLENOL) 500 MG tablet Take 1,000 mg by mouth every 6 (six) hours as needed for moderate pain.   Past Month at Unknown time  . diphenhydramine-acetaminophen (TYLENOL PM) 25-500 MG TABS Take 2 tablets by mouth at bedtime as needed.   Past Month at Unknown time  . methocarbamol (ROBAXIN) 500 MG tablet Take 1 tablet (500 mg total) by mouth every 6 (six) hours as needed for muscle spasms. 60 tablet 3 Past Month at Unknown time  . metoprolol succinate (TOPROL-XL) 50 MG 24 hr tablet Take 50 mg by mouth daily after supper.    Past Month at Unknown time  . oxyCODONE-acetaminophen (PERCOCET/ROXICET) 5-325 MG per tablet Take 1-2 tablets by mouth every 4 (four) hours as needed for moderate pain. 60 tablet 0 11/12/2014 at Unknown time  . ranitidine (ZANTAC) 150 MG tablet Take 150 mg by mouth 2 (two) times daily as needed for heartburn.    Past Month at Unknown time  . folic acid (FOLVITE) 1 MG tablet Take 1 tablet (1 mg total) by mouth daily. (Patient not taking: Reported on 11/13/2014) 30 tablet 1 Not Taking at Unknown time  . morphine (MSIR) 15 MG tablet Take 1 tablet (15 mg total) by mouth every 6 (six) hours as needed for severe pain. (Patient not taking: Reported on 11/13/2014) 90 tablet 0 Not Taking at Unknown time  . vitamin B-12 (CYANOCOBALAMIN) 1000 MCG tablet Take 1 tablet (1,000 mcg total) by mouth daily. (Patient not taking: Reported on 11/13/2014) 30 tablet 1 Not Taking at Unknown time    . sodium chloride   Intravenous STAT  . [MAR Hold] antiseptic oral rinse  7 mL Mouth Rinse BID  . [MAR Hold] metoprolol succinate  50 mg Oral QPC supper  . [MAR Hold]  nicotine  21 mg Transdermal Daily  . [MAR Hold] pantoprazole (PROTONIX) IV  40 mg Intravenous Q24H  . sodium chloride  1,000 mL Intravenous Once  . [MAR Hold] sodium chloride  3 mL Intravenous Q12H    PRN:0.9 % irrigation (POUR BTL), [MAR Hold] acetaminophen, [MAR Hold] diphenhydrAMINE **AND** [MAR Hold] acetaminophen, [MAR Hold]  HYDROmorphone (DILAUDID) injection, [MAR Hold] methocarbamol, [MAR Hold] morphine, [MAR Hold] ondansetron (ZOFRAN) IV, [MAR Hold] oxyCODONE-acetaminophen, [MAR Hold] polyethylene glycol  ROS: Constitutional: Denies fevers, chills or abnormal night sweats Eyes: Denies blurriness of vision, double vision or watery eyes. She has left scleral/subconjunctival hemorrhage due to straining during bowel movement.  Ears, nose, mouth, throat, and face: Denies mucositis  or sore throat Respiratory: Denies cough, dyspnea or wheezes Cardiovascular: Denies palpitation, chest discomfort or lower extremity swelling Gastrointestinal:  Denies nausea, heartburn at this time. She is constipated, last bowel movement 1 week ago due to incarcerated hernia.  Skin: Denies abnormal skin rashes Lymphatics: Denies new lymphadenopathy or easy bruising Neurological:Denies numbness, tingling or new weaknesses Behavioral/Psych: Mood is stable, no new changes  All other systems were reviewed with the patient and are negative.    Family History:    Family History  Problem Relation Age of Onset  . Hypertension    . Heart attack    . Leukemia Paternal Uncle     leukemia    No family history of hematological  disorders.  Social History:  reports that she has been smoking Cigarettes.  She has a 11.5 pack-year smoking history. She has never used smokeless tobacco. She reports that she does not drink alcohol or use illicit drugs.  Physical Exam    ECOG PERFORMANCE STATUS:  Filed Vitals:   11/13/14 0633  BP: 106/91  Pulse: 122  Temp: 99.9 F (37.7 C)  Resp: 20   Filed Weights    11/12/14 2310 11/13/14 0633  Weight: 164 lb (74.39 kg) 169 lb 12.8 oz (77.021 kg)    GENERAL:alert, no distress and comfortable. somnolent SKIN: skin color, texture, turgor are normal, no rashes or significant lesions EYES: normal, conjunctiva are pink and non-injected, sclera clear OROPHARYNX:no exudate, no erythema and lips, buccal mucosa, and tongue normal. NG tube in situ NECK: supple, thyroid normal size, non-tender, without nodularity LYMPH:  no palpable lymphadenopathy in the cervical, axillary or inguinal area LUNGS: clear to auscultation and percussion with normal breathing effort HEART: regular rate & rhythm and no murmurs and no lower extremity edema ABDOMEN: incisional tenderness, bandaged.  Musculoskeletal:no cyanosis of digits and no clubbing  NEURO: no focal motor/sensory deficits   Labs:    Recent Labs Lab 11/12/14 2316  WBC 30.5*  HGB 16.7*  HCT 47.3*  PLT 122*  MCV 85.7  MCH 30.3  MCHC 35.3  RDW 13.7  LYMPHSABS 1.5  MONOABS 1.5*  EOSABS 0.0  BASOSABS 0.0        Recent Labs Lab 11/12/14 2316  NA 137  K 4.0  CL 98  CO2 23  GLUCOSE 218*  BUN 18  CREATININE 0.82  CALCIUM 10.0  AST 30  ALT 62*  ALKPHOS 133*  BILITOT 1.1        Component Value Date/Time   BILITOT 1.1 11/12/2014 2316   BILITOT 1.15 09/29/2013 1500   BILIDIR 0.1 08/05/2013 1445   IBILI 0.4 08/05/2013 1445      Recent Labs Lab 11/13/14 0850  INR 0.99    No results for input(s): DDIMER in the last 72 hours.   Anemia panel:  No results for input(s): VITAMINB12, FOLATE, FERRITIN, TIBC, IRON, RETICCTPCT in the last 72 hours.  Urinalysis    Component Value Date/Time   COLORURINE AMBER* 11/13/2014 0233   APPEARANCEUR CLEAR 11/13/2014 0233   LABSPEC 1.033* 11/13/2014 0233   PHURINE 5.0 11/13/2014 0233   GLUCOSEU NEGATIVE 11/13/2014 0233   HGBUR NEGATIVE 11/13/2014 0233   BILIRUBINUR SMALL* 11/13/2014 0233   KETONESUR 40* 11/13/2014 0233   PROTEINUR 30*  11/13/2014 0233   UROBILINOGEN 0.2 11/13/2014 0233   NITRITE NEGATIVE 11/13/2014 0233   LEUKOCYTESUR NEGATIVE 11/13/2014 0233    Drugs of Abuse  No results found for: LABOPIA, COCAINSCRNUR, LABBENZ, AMPHETMU, THCU, LABBARB   Imaging Studies:  Ct  Abdomen Pelvis W Contrast  11/13/2014   CLINICAL DATA:  Severe constipation.  EXAM: CT ABDOMEN AND PELVIS WITH CONTRAST  TECHNIQUE: Multidetector CT imaging of the abdomen and pelvis was performed using the standard protocol following bolus administration of intravenous contrast.  CONTRAST:  77mL OMNIPAQUE IOHEXOL 300 MG/ML SOLN, 163mL OMNIPAQUE IOHEXOL 300 MG/ML SOLN  COMPARISON:  09/28/2013  FINDINGS: BODY WALL: Massive low abdominal wall hernia, as discussed below.  LOWER CHEST: Bibasilar atelectasis. There is a Bochdalek's hernia containing fat on the left.  ABDOMEN/PELVIS:  Liver: Stable hemangioma in the posterior segment right liver measuring 55 x 35 mm. The hemangioma has contracted since 2009 MRI.  Biliary: Numerous gallstones. No biliary obstruction or inflammation.  Pancreas: Unremarkable.  Spleen: Unremarkable.  Adrenals: Unremarkable.  Kidneys and ureters: No hydronephrosis or stone.  Bladder: Unremarkable.  Reproductive: Hysterectomy and presumed oophorectomies.  Bowel: There is dilated small bowel, maximal before it enters a large low abdominal wall hernia. Bowel beyond the herniated segments are relatively decompressed. There is mesenteric congestion in the associated mesenteric fat which is mild but new. No evidence of bowel necrosis or perforation. There is a moderate volume of colonic stool. Normal appendix.  Retroperitoneum: No mass or adenopathy.  Peritoneum: No ascites or pneumoperitoneum.  Vascular: Prominent tortuosity of the infrarenal aorta, with to 90 degree curves in the upper abdomen.  OSSEOUS: L1 corpectomy with strut graft. There has been progressive subsidence especially into the T12 vertebra where there is lucency around the upper  graft. There is doubtful bony fusion across the discectomy level. The inferior portion of the graft continues to the L2 inferior endplate, as prior. No acute fracture. No progressive retropulsion.  IMPRESSION: 1. Partial small bowel obstruction and mesenteric congestion from a large low abdominal wall hernia. 2. L1 corpectomy with strut graft. There is prominent subsidence into the T12 vertebral body with doubtful osseous fusion across the operative level. 3. Cholelithiasis and other chronic findings are noted above.   Electronically Signed   By: Monte Fantasia M.D.   On: 11/13/2014 03:50     A/P: 63 y.o. female with :  History of ITP Patient is steroid refractory. She is s/p  Nplate, discontinued on 04/10/14 as she had good response to treatment Platelets on admission were 122,000, within her baseline range (100-120k) She was admitted for incarcerated hernia repair. She had her surgery without any postoperative bleeding complications and did not require platelet transfusions. Will continue to monitor. No treatment or transfusion unless platelet <50,000. Would not surprise me if platelet count drops tomorrow. Monitor daily.  Leukocytosis This is chronic, in the setting of recent surgery, pain, inflammation and a history of tobacco abuse. She is afebrile.  No intervention is indicated at this time, will continue to follow.   Partial bowel obstruction Incarcerated hernia She is now s/p exploratory laparotomy with reduction of hernia, and primary repair of incisional hernia  by Dr. Donne Hazel on 11/13/14 She tolerated surgery well, with minimal blood loss No transfusion of platelets was indicated  Severe constipation Due to Partial bowel obstruction/ Incarcerated hernia She will need Miralax and stool softeners due to increased need for pain meds after surgery  Subconjunctival hemorrhage Due to increased straining due to severe constipation  No other areas of bleeding. Denies blurred  vision Continue to monitor  DVT prophylaxis On mechanical devices  Full Code   Other medical issues including Diabetes, and chronic kidney disease as per admitting team.   **Disclaimer: This note was dictated with voice  recognition software. Similar sounding words can inadvertently be transcribed and this note may contain transcription errors which may not have been corrected upon publication of note.Sharene Butters E, PA-C 11/13/2014 11:49 AM  Galya Dunnigan, MD 11/13/2014

## 2014-11-13 NOTE — Consult Note (Signed)
Reason for Consult:PSBO, incisional hernia  Referring Physician: [name] is an 64 y.o. female.  HPI: [name] has a history of a large lower abdominal incisional hernia for several years. She says she developed it after her hysterectomy and oophorectomy. She complains of constipation for several days. She took some laxatives without relief. She developed more pain in her hernia and began having nausea and vomiting last night. She came to the emergency department. Laboratory studies revealed leukocytosis. CT scan of the abdomen and pelvis shows partial small bowel obstruction from large low abdominal wall hernia. She also has a history of ITP followed by Dr. Alvy Bimler at the cancer center.  Past Medical History  Diagnosis Date  . Chest pain     denies  . Tachycardia     occ upon exersion, and when put on bp med  . HTN (hypertension)   . Dysrhythmia     occ tachycardia  . History of kidney stones   . GERD (gastroesophageal reflux disease)     occ  . Fracture of vertebra, compression   . ITP (idiopathic thrombocytopenic purpura) 08/29/2013  . Leukocytosis, unspecified 09/05/2013  . Back pain 09/29/2013  . Muscle cramp 11/16/2013  . Depression     states her current situation with activity limitations, she has a low spirit at times.   . Chronic kidney disease     passed stones spontaneously- 40 yrs. ago   . Arthritis     lumbar burst fx, osteoporosis  . Blood dyscrasia     Past Surgical History  Procedure Laterality Date  . Laparotomy Right 09    ovarian cyst  . Total abdominal hysterectomy w/ bilateral salpingoophorectomy  97  . Cesarean section      x2  . Abdominal hysterectomy    . Anterior lat lumbar fusion N/A 03/28/2014    Procedure: Anterolateral decompression of L1 fracture with posterior segmental stabilization;  Surgeon: Kristeen Miss, MD;  Location: [facility];  Service: Neurosurgery;  Laterality: N/A;  Anterolateral decompression of Lumbar One fracture  with posterior segmental stabilization  . Chest tube insertion Left 03/28/2014    Procedure: CHEST TUBE INSERTION;  Surgeon: Ivin Poot, MD;  Location: [facility];  Service: Thoracic;  Laterality: Left;    Family History  Problem Relation Age of Onset  . Hypertension    . Heart attack    . Leukemia Paternal Uncle     leukemia    Social History:  reports that she has been smoking Cigarettes.  She has a 11.5 pack-year smoking history. She has never used smokeless tobacco. She reports that she does not drink alcohol or use illicit drugs.  Allergies: No Known Allergies  Medications:  Prior to Admission:  Prescriptions prior to admission  Medication Sig Dispense Refill Last Dose  . acetaminophen (TYLENOL) 500 MG tablet Take 1,000 mg by mouth every 6 (six) hours as needed for moderate pain.   Past Month at Unknown time  . diphenhydramine-acetaminophen (TYLENOL PM) 25-500 MG TABS Take 2 tablets by mouth at bedtime as needed.   Past Month at Unknown time  . methocarbamol (ROBAXIN) 500 MG tablet Take 1 tablet (500 mg total) by mouth every 6 (six) hours as needed for muscle spasms. 60 tablet 3 Past Month at Unknown time  . metoprolol succinate (TOPROL-XL) 50 MG 24 hr tablet Take 50 mg by mouth daily after supper.    Past Month at Unknown time  . oxyCODONE-acetaminophen (PERCOCET/ROXICET) 5-325 MG per  tablet Take 1-2 tablets by mouth every 4 (four) hours as needed for moderate pain. 60 tablet 0 11/12/2014 at Unknown time  . ranitidine (ZANTAC) 150 MG tablet Take 150 mg by mouth 2 (two) times daily as needed for heartburn.    Past Month at Unknown time  . folic acid (FOLVITE) 1 MG tablet Take 1 tablet (1 mg total) by mouth daily. (Patient not taking: Reported on 11/13/2014) 30 tablet 1 Not Taking at Unknown time  . morphine (MSIR) 15 MG tablet Take 1 tablet (15 mg total) by mouth every 6 (six) hours as needed for severe pain. (Patient not taking: Reported on 11/13/2014) 90 tablet 0 Not Taking at  Unknown time  . vitamin B-12 (CYANOCOBALAMIN) 1000 MCG tablet Take 1 tablet (1,000 mcg total) by mouth daily. (Patient not taking: Reported on 11/13/2014) 30 tablet 1 Not Taking at Unknown time    Results for orders placed or performed during the hospital encounter of 11/13/14 (from the past 48 hour(s))  CBC with Differential     Status: Abnormal   Collection Time: 11/12/14 11:16 PM  Result Value Ref Range   WBC 30.5 (H) 4.0 - 10.5 K/uL   RBC 5.52 (H) 3.87 - 5.11 MIL/uL   Hemoglobin 16.7 (H) 12.0 - 15.0 g/dL   HCT 47.3 (H) 36.0 - 46.0 %   MCV 85.7 78.0 - 100.0 fL   MCH 30.3 26.0 - 34.0 pg   MCHC 35.3 30.0 - 36.0 g/dL   RDW 13.7 11.5 - 15.5 %   Platelets 122 (L) 150 - 400 K/uL   Neutrophils Relative % 90 (H) 43 - 77 %   Lymphocytes Relative 5 (L) 12 - 46 %   Monocytes Relative 5 3 - 12 %   Eosinophils Relative 0 0 - 5 %   Basophils Relative 0 0 - 1 %   Neutro Abs 27.5 (H) 1.7 - 7.7 K/uL   Lymphs Abs 1.5 0.7 - 4.0 K/uL   Monocytes Absolute 1.5 (H) 0.1 - 1.0 K/uL   Eosinophils Absolute 0.0 0.0 - 0.7 K/uL   Basophils Absolute 0.0 0.0 - 0.1 K/uL   Smear Review MORPHOLOGY UNREMARKABLE   Comprehensive metabolic panel     Status: Abnormal   Collection Time: 11/12/14 11:16 PM  Result Value Ref Range   Sodium 137 135 - 145 mmol/L   Potassium 4.0 3.5 - 5.1 mmol/L   Chloride 98 96 - 112 mmol/L   CO2 23 19 - 32 mmol/L   Glucose, Bld 218 (H) 70 - 99 mg/dL   BUN 18 6 - 23 mg/dL   Creatinine, Ser 0.82 0.50 - 1.10 mg/dL   Calcium 10.0 8.4 - 10.5 mg/dL   Total Protein 7.3 6.0 - 8.3 g/dL   Albumin 5.1 3.5 - 5.2 g/dL   AST 30 0 - 37 U/L   ALT 62 (H) 0 - 35 U/L   Alkaline Phosphatase 133 (H) 39 - 117 U/L   Total Bilirubin 1.1 0.3 - 1.2 mg/dL   GFR calc non Af Amer 75 (L) >90 mL/min   GFR calc Af Amer 87 (L) >90 mL/min    Comment: (NOTE) The eGFR has been calculated using the CKD EPI equation. This calculation has not been validated in all clinical situations. eGFR's persistently <90  mL/min signify possible Chronic Kidney Disease.    Anion gap 16 (H) 5 - 15  Lipase, blood     Status: None   Collection Time: 11/12/14 11:16 PM  Result Value Ref  Range   Lipase 28 11 - 59 U/L  I-Stat CG4 Lactic Acid, ED     Status: Abnormal   Collection Time: 11/13/14  1:06 AM  Result Value Ref Range   Lactic Acid, Venous 3.01 (HH) 0.5 - 2.0 mmol/L   Comment NOTIFIED PHYSICIAN   Urinalysis, Routine w reflex microscopic     Status: Abnormal   Collection Time: 11/13/14  2:33 AM  Result Value Ref Range   Color, Urine AMBER (A) YELLOW    Comment: BIOCHEMICALS MAY BE AFFECTED BY COLOR   APPearance CLEAR CLEAR   Specific Gravity, Urine 1.033 (H) 1.005 - 1.030   pH 5.0 5.0 - 8.0   Glucose, UA NEGATIVE NEGATIVE mg/dL   Hgb urine dipstick NEGATIVE NEGATIVE   Bilirubin Urine SMALL (A) NEGATIVE   Ketones, ur 40 (A) NEGATIVE mg/dL   Protein, ur 30 (A) NEGATIVE mg/dL   Urobilinogen, UA 0.2 0.0 - 1.0 mg/dL   Nitrite NEGATIVE NEGATIVE   Leukocytes, UA NEGATIVE NEGATIVE  Urine microscopic-add on     Status: None   Collection Time: 11/13/14  2:33 AM  Result Value Ref Range   Squamous Epithelial / LPF RARE RARE   Bacteria, UA RARE RARE   Urine-Other MUCOUS PRESENT   I-Stat CG4 Lactic Acid, ED     Status: None   Collection Time: 11/13/14  4:48 AM  Result Value Ref Range   Lactic Acid, Venous 1.44 0.5 - 2.0 mmol/L    Ct Abdomen Pelvis W Contrast  11/13/2014   CLINICAL DATA:  Severe constipation.  EXAM: CT ABDOMEN AND PELVIS WITH CONTRAST  TECHNIQUE: Multidetector CT imaging of the abdomen and pelvis was performed using the standard protocol following bolus administration of intravenous contrast.  CONTRAST:  38m OMNIPAQUE IOHEXOL 300 MG/ML SOLN, 1038mOMNIPAQUE IOHEXOL 300 MG/ML SOLN  COMPARISON:  09/28/2013  FINDINGS: BODY WALL: Massive low abdominal wall hernia, as discussed below.  LOWER CHEST: Bibasilar atelectasis. There is a Bochdalek's hernia containing fat on the left.  ABDOMEN/PELVIS:   Liver: Stable hemangioma in the posterior segment right liver measuring 55 x 35 mm. The hemangioma has contracted since 2009 MRI.  Biliary: Numerous gallstones. No biliary obstruction or inflammation.  Pancreas: Unremarkable.  Spleen: Unremarkable.  Adrenals: Unremarkable.  Kidneys and ureters: No hydronephrosis or stone.  Bladder: Unremarkable.  Reproductive: Hysterectomy and presumed oophorectomies.  Bowel: There is dilated small bowel, maximal before it enters a large low abdominal wall hernia. Bowel beyond the herniated segments are relatively decompressed. There is mesenteric congestion in the associated mesenteric fat which is mild but new. No evidence of bowel necrosis or perforation. There is a moderate volume of colonic stool. Normal appendix.  Retroperitoneum: No mass or adenopathy.  Peritoneum: No ascites or pneumoperitoneum.  Vascular: Prominent tortuosity of the infrarenal aorta, with to 90 degree curves in the upper abdomen.  OSSEOUS: L1 corpectomy with strut graft. There has been progressive subsidence especially into the T12 vertebra where there is lucency around the upper graft. There is doubtful bony fusion across the discectomy level. The inferior portion of the graft continues to the L2 inferior endplate, as prior. No acute fracture. No progressive retropulsion.  IMPRESSION: 1. Partial small bowel obstruction and mesenteric congestion from a large low abdominal wall hernia. 2. L1 corpectomy with strut graft. There is prominent subsidence into the T12 vertebral body with doubtful osseous fusion across the operative level. 3. Cholelithiasis and other chronic findings are noted above.   Electronically Signed   By: JoAngelica Chessman  Watts M.D.   On: 11/13/2014 03:50    Review of Systems  Constitutional: Negative for fever and chills.  HENT: Negative.   Eyes:       Several day history of left subconjunctival hemorrhage  Respiratory: Negative.   Cardiovascular: Negative.   Gastrointestinal: Positive  for heartburn, nausea, vomiting and abdominal pain.  Genitourinary: Negative.   Musculoskeletal:       Recent back surgery by Dr. Ellene Route  Skin: Negative.   Neurological: Negative.   Endo/Heme/Allergies: Bruises/bleeds easily.  Psychiatric/Behavioral: Negative.    Blood pressure 112/66, pulse 124, temperature 97.6 F (36.4 C), temperature source Oral, resp. rate 21, height '4\' 10"'  (1.473 m), weight 164 lb (74.39 kg), SpO2 90 %. Physical Exam  Constitutional: She is oriented to person, place, and time. She appears well-developed and well-nourished. No distress.  HENT:  Head: Normocephalic and atraumatic.  Right Ear: External ear normal.  Left Ear: External ear normal.  Nose: Nose normal.  Mouth/Throat: Oropharynx is clear and moist. No oropharyngeal exudate.  Eyes: EOM are normal. Pupils are equal, round, and reactive to light.  Left medial subconjunctival hemorrhage  Neck: Normal range of motion. Neck supple. No tracheal deviation present.  Cardiovascular: Normal rate, regular rhythm, normal heart sounds and intact distal pulses.   Respiratory: Effort normal and breath sounds normal. No stridor. No respiratory distress. She has no wheezes. She has no rales.  GI: Soft. She exhibits distension. There is tenderness. There is no rebound and no guarding.    Large lower abdominal wall incisional hernia to the right side only partially reduces, mild tenderness  Musculoskeletal: Normal range of motion.  Neurological: She is alert and oriented to person, place, and time. She exhibits normal muscle tone.  Skin: Skin is warm and dry.  Psychiatric: She has a normal mood and affect.    Assessment/Plan: PSBO due to large lower abdominal wall incisional hernia - NPO, IVF. We will review her ITP records and discuss plans for likely surgery to repair the hernia and relieve the obstruction.  Chelle Cayton E 11/13/2014, 6:23 AM

## 2014-11-14 ENCOUNTER — Encounter (HOSPITAL_COMMUNITY): Payer: Self-pay | Admitting: General Surgery

## 2014-11-14 LAB — COMPREHENSIVE METABOLIC PANEL
ALT: 22 U/L (ref 0–35)
AST: 15 U/L (ref 0–37)
Albumin: 2.9 g/dL — ABNORMAL LOW (ref 3.5–5.2)
Alkaline Phosphatase: 65 U/L (ref 39–117)
Anion gap: 12 (ref 5–15)
BUN: 11 mg/dL (ref 6–23)
CO2: 26 mmol/L (ref 19–32)
Calcium: 8.5 mg/dL (ref 8.4–10.5)
Chloride: 102 mmol/L (ref 96–112)
Creatinine, Ser: 0.62 mg/dL (ref 0.50–1.10)
GFR calc Af Amer: >90 mL/min (ref >90)
GFR calc non Af Amer: >90 mL/min (ref >90)
Glucose, Bld: 112 mg/dL — ABNORMAL HIGH (ref 70–99)
Potassium: 4.1 mmol/L (ref 3.5–5.1)
Sodium: 140 mmol/L (ref 135–145)
Total Bilirubin: 1.5 mg/dL — ABNORMAL HIGH (ref 0.3–1.2)
Total Protein: 4.9 g/dL — ABNORMAL LOW (ref 6.0–8.3)

## 2014-11-14 LAB — CBC
HCT: 33.9 % — ABNORMAL LOW (ref 36.0–46.0)
Hemoglobin: 11.2 g/dL — ABNORMAL LOW (ref 12.0–15.0)
MCH: 29.1 pg (ref 26.0–34.0)
MCHC: 33 g/dL (ref 30.0–36.0)
MCV: 88.1 fL (ref 78.0–100.0)
Platelets: 63 10*3/uL — ABNORMAL LOW (ref 150–400)
RBC: 3.85 MIL/uL — ABNORMAL LOW (ref 3.87–5.11)
RDW: 14.6 % (ref 11.5–15.5)
WBC: 18.2 10*3/uL — ABNORMAL HIGH (ref 4.0–10.5)

## 2014-11-14 LAB — GLUCOSE, CAPILLARY: Glucose-Capillary: 103 mg/dL — ABNORMAL HIGH (ref 70–99)

## 2014-11-14 MED ORDER — MORPHINE SULFATE 2 MG/ML IJ SOLN: 1.0000 mg | INTRAMUSCULAR | Status: DC | PRN

## 2014-11-14 MED ORDER — KCL IN DEXTROSE-NACL 20-5-0.45 MEQ/L-%-% IV SOLN
INTRAVENOUS | Status: DC
  Administered 2014-11-14 (×2): via INTRAVENOUS
  Administered 2014-11-15: 100 mL/h via INTRAVENOUS
  Filled 2014-11-14 (×6): qty 1000

## 2014-11-14 MED ORDER — ONDANSETRON HCL 4 MG/2ML IJ SOLN
4.0000 mg | Freq: Four times a day (QID) | INTRAMUSCULAR | Status: DC | PRN
  Filled 2014-11-14: qty 2

## 2014-11-14 MED ORDER — ACETAMINOPHEN 160 MG/5ML PO SOLN
1000.0000 mg | Freq: Three times a day (TID) | ORAL | Status: DC | PRN
  Administered 2014-11-14 – 2014-11-15 (×2): 1000 mg via ORAL
  Filled 2014-11-14 (×3): qty 40

## 2014-11-14 MED ORDER — MORPHINE SULFATE 2 MG/ML IJ SOLN
1.0000 mg | INTRAMUSCULAR | Status: DC | PRN
  Administered 2014-11-14: 2 mg via INTRAVENOUS
  Administered 2014-11-14: 4 mg via INTRAVENOUS
  Administered 2014-11-14 (×2): 2 mg via INTRAVENOUS
  Administered 2014-11-15 (×3): 4 mg via INTRAVENOUS
  Filled 2014-11-14: qty 2
  Filled 2014-11-14: qty 1
  Filled 2014-11-14: qty 2
  Filled 2014-11-14: qty 1
  Filled 2014-11-14: qty 2
  Filled 2014-11-14: qty 1
  Filled 2014-11-14: qty 2

## 2014-11-14 NOTE — Progress Notes (Signed)
Utilization Review Completed.[name] T3/09/2014  

## 2014-11-14 NOTE — Progress Notes (Signed)
Patient ID: [name], female   DOB: 05-01-1951, 64 y.o.   MRN: [id number]     [address] [place], [place] [address]    Phone: [phone] FAX: [phone]     Subjective: 442m out of NGT with about 200 that's water.  No flatus.  No n/v.    Objective:  Vital signs:  Filed Vitals:   11/13/14 1401 11/13/14 1746 11/13/14 2100 11/14/14 0500  BP: 125/74 128/70 132/57 121/65  Pulse: 118 121 85 100  Temp: 99.2 F (37.3 C) 99.8 F (37.7 C) 97.8 F (36.6 C) 99.2 F (37.3 C)  TempSrc: Oral Oral Oral Oral  Resp: '17 18 18 16  ' Height:      Weight:   170 lb 3.1 oz (77.2 kg)   SpO2: 95% 93% 97% 95%       Intake/Output   Yesterday:  02/29 0701 - 03/01 0700 In: 4958.3 [I.V.:4858.3; IV Piggyback:100] Out: 1025 [Urine:175; Blood:50] This shift:     Physical Exam: General: Pt awake/alert/oriented x4 in no acute distress Chest: cta. No chest wall pain w good excursion CV:  s1s2 rrr, tachy, no murmurs gallops or rubs.  MS: Normal AROM mjr joints.  No obvious deformity Abdomen: Soft.  Nondistended.  Appropriately tender.  Midline incision is dry and intact.  JP drain with serosanguinous output.  No evidence of peritonitis. Ext:  SCDs BLE.  No mjr edema.  No cyanosis Skin: No petechiae / purpura   Problem List:   Principal Problem:   SOB (shortness of breath) Active Problems:   HTN (hypertension)   ITP (idiopathic thrombocytopenic purpura)   Tobacco abuse   Leukocytosis   Abdominal pain   Sepsis   GERD (gastroesophageal reflux disease)   Constipation    Results:   Labs: Results for orders placed or performed during the hospital encounter of 11/13/14 (from the past 48 hour(s))  CBC with Differential     Status: Abnormal   Collection Time: 11/12/14 11:16 PM  Result Value Ref Range   WBC 30.5 (H) 4.0 - 10.5 K/uL   RBC 5.52 (H) 3.87 - 5.11 MIL/uL   Hemoglobin 16.7 (H) 12.0 - 15.0 g/dL   HCT  47.3 (H) 36.0 - 46.0 %   MCV 85.7 78.0 - 100.0 fL   MCH 30.3 26.0 - 34.0 pg   MCHC 35.3 30.0 - 36.0 g/dL   RDW 13.7 11.5 - 15.5 %   Platelets 122 (L) 150 - 400 K/uL   Neutrophils Relative % 90 (H) 43 - 77 %   Lymphocytes Relative 5 (L) 12 - 46 %   Monocytes Relative 5 3 - 12 %   Eosinophils Relative 0 0 - 5 %   Basophils Relative 0 0 - 1 %   Neutro Abs 27.5 (H) 1.7 - 7.7 K/uL   Lymphs Abs 1.5 0.7 - 4.0 K/uL   Monocytes Absolute 1.5 (H) 0.1 - 1.0 K/uL   Eosinophils Absolute 0.0 0.0 - 0.7 K/uL   Basophils Absolute 0.0 0.0 - 0.1 K/uL   Smear Review MORPHOLOGY UNREMARKABLE   Comprehensive metabolic panel     Status: Abnormal   Collection Time: 11/12/14 11:16 PM  Result Value Ref Range   Sodium 137 135 - 145 mmol/L   Potassium 4.0 3.5 - 5.1 mmol/L   Chloride 98 96 - 112 mmol/L   CO2 23 19 - 32 mmol/L   Glucose, Bld 218 (H) 70 -  99 mg/dL   BUN 18 6 - 23 mg/dL   Creatinine, Ser 0.82 0.50 - 1.10 mg/dL   Calcium 10.0 8.4 - 10.5 mg/dL   Total Protein 7.3 6.0 - 8.3 g/dL   Albumin 5.1 3.5 - 5.2 g/dL   AST 30 0 - 37 U/L   ALT 62 (H) 0 - 35 U/L   Alkaline Phosphatase 133 (H) 39 - 117 U/L   Total Bilirubin 1.1 0.3 - 1.2 mg/dL   GFR calc non Af Amer 75 (L) >90 mL/min   GFR calc Af Amer 87 (L) >90 mL/min    Comment: (NOTE) The eGFR has been calculated using the CKD EPI equation. This calculation has not been validated in all clinical situations. eGFR's persistently <90 mL/min signify possible Chronic Kidney Disease.    Anion gap 16 (H) 5 - 15  Lipase, blood     Status: None   Collection Time: 11/12/14 11:16 PM  Result Value Ref Range   Lipase 28 11 - 59 U/L  I-Stat CG4 Lactic Acid, ED     Status: Abnormal   Collection Time: 11/13/14  1:06 AM  Result Value Ref Range   Lactic Acid, Venous 3.01 (HH) 0.5 - 2.0 mmol/L   Comment NOTIFIED PHYSICIAN   Urinalysis, Routine w reflex microscopic     Status: Abnormal   Collection Time: 11/13/14  2:33 AM  Result Value Ref Range   Color, Urine  AMBER (A) YELLOW    Comment: BIOCHEMICALS MAY BE AFFECTED BY COLOR   APPearance CLEAR CLEAR   Specific Gravity, Urine 1.033 (H) 1.005 - 1.030   pH 5.0 5.0 - 8.0   Glucose, UA NEGATIVE NEGATIVE mg/dL   Hgb urine dipstick NEGATIVE NEGATIVE   Bilirubin Urine SMALL (A) NEGATIVE   Ketones, ur 40 (A) NEGATIVE mg/dL   Protein, ur 30 (A) NEGATIVE mg/dL   Urobilinogen, UA 0.2 0.0 - 1.0 mg/dL   Nitrite NEGATIVE NEGATIVE   Leukocytes, UA NEGATIVE NEGATIVE  Urine microscopic-add on     Status: None   Collection Time: 11/13/14  2:33 AM  Result Value Ref Range   Squamous Epithelial / LPF RARE RARE   Bacteria, UA RARE RARE   Urine-Other MUCOUS PRESENT   I-Stat CG4 Lactic Acid, ED     Status: None   Collection Time: 11/13/14  4:48 AM  Result Value Ref Range   Lactic Acid, Venous 1.44 0.5 - 2.0 mmol/L  Culture, blood (routine x 2)     Status: None (Preliminary result)   Collection Time: 11/13/14  7:10 AM  Result Value Ref Range   Specimen Description BLOOD LEFT ANTECUBITAL    Special Requests BOTTLES DRAWN AEROBIC AND ANAEROBIC 10CC    Culture             BLOOD CULTURE RECEIVED NO GROWTH TO DATE CULTURE WILL BE HELD FOR 5 DAYS BEFORE ISSUING A FINAL NEGATIVE REPORT Performed at [facility]    Report Status PENDING   Lactic acid, plasma     Status: None   Collection Time: 11/13/14  7:10 AM  Result Value Ref Range   Lactic Acid, Venous 1.1 0.5 - 2.0 mmol/L  Culture, blood (routine x 2)     Status: None (Preliminary result)   Collection Time: 11/13/14  7:25 AM  Result Value Ref Range   Specimen Description BLOOD LEFT HAND    Special Requests BOTTLES DRAWN AEROBIC AND ANAEROBIC 10CC    Culture  BLOOD CULTURE RECEIVED NO GROWTH TO DATE CULTURE WILL BE HELD FOR 5 DAYS BEFORE ISSUING A FINAL NEGATIVE REPORT Performed at [facility]    Report Status PENDING   Glucose, capillary     Status: Abnormal   Collection Time: 11/13/14  8:19 AM  Result Value Ref Range    Glucose-Capillary 122 (H) 70 - 99 mg/dL  Lactic acid, plasma     Status: None   Collection Time: 11/13/14  8:50 AM  Result Value Ref Range   Lactic Acid, Venous 1.3 0.5 - 2.0 mmol/L  Procalcitonin     Status: None   Collection Time: 11/13/14  8:50 AM  Result Value Ref Range   Procalcitonin 0.33 ng/mL    Comment:        Interpretation: PCT (Procalcitonin) <= 0.5 ng/mL: Systemic infection (sepsis) is not likely. Local bacterial infection is possible. (NOTE)         ICU PCT Algorithm               Non ICU PCT Algorithm    ----------------------------     ------------------------------         PCT < 0.25 ng/mL                 PCT < 0.1 ng/mL     Stopping of antibiotics            Stopping of antibiotics       strongly encouraged.               strongly encouraged.    ----------------------------     ------------------------------       PCT level decrease by               PCT < 0.25 ng/mL       >= 80% from peak PCT       OR PCT 0.25 - 0.5 ng/mL          Stopping of antibiotics                                             encouraged.     Stopping of antibiotics           encouraged.    ----------------------------     ------------------------------       PCT level decrease by              PCT >= 0.25 ng/mL       < 80% from peak PCT        AND PCT >= 0.5 ng/mL            Continuin g antibiotics                                              encouraged.       Continuing antibiotics            encouraged.    ----------------------------     ------------------------------     PCT level increase compared          PCT > 0.5 ng/mL         with peak PCT AND          PCT >= 0.5 ng/mL  Escalation of antibiotics                                          strongly encouraged.      Escalation of antibiotics        strongly encouraged.   Protime-INR     Status: None   Collection Time: 11/13/14  8:50 AM  Result Value Ref Range   Prothrombin Time 13.2 11.6 - 15.2 seconds   INR 0.99  0.00 - 1.49  APTT     Status: None   Collection Time: 11/13/14  8:50 AM  Result Value Ref Range   aPTT 35 24 - 37 seconds  Type and screen     Status: None   Collection Time: 11/13/14  8:55 AM  Result Value Ref Range   ABO/RH(D) A POS    Antibody Screen NEG    Sample Expiration 11/16/2014   APTT     Status: None   Collection Time: 11/13/14  3:20 PM  Result Value Ref Range   aPTT 28 24 - 37 seconds    Imaging / Studies: Ct Abdomen Pelvis W Contrast  11/13/2014   CLINICAL DATA:  Severe constipation.  EXAM: CT ABDOMEN AND PELVIS WITH CONTRAST  TECHNIQUE: Multidetector CT imaging of the abdomen and pelvis was performed using the standard protocol following bolus administration of intravenous contrast.  CONTRAST:  25m OMNIPAQUE IOHEXOL 300 MG/ML SOLN, 1065mOMNIPAQUE IOHEXOL 300 MG/ML SOLN  COMPARISON:  09/28/2013  FINDINGS: BODY WALL: Massive low abdominal wall hernia, as discussed below.  LOWER CHEST: Bibasilar atelectasis. There is a Bochdalek's hernia containing fat on the left.  ABDOMEN/PELVIS:  Liver: Stable hemangioma in the posterior segment right liver measuring 55 x 35 mm. The hemangioma has contracted since 2009 MRI.  Biliary: Numerous gallstones. No biliary obstruction or inflammation.  Pancreas: Unremarkable.  Spleen: Unremarkable.  Adrenals: Unremarkable.  Kidneys and ureters: No hydronephrosis or stone.  Bladder: Unremarkable.  Reproductive: Hysterectomy and presumed oophorectomies.  Bowel: There is dilated small bowel, maximal before it enters a large low abdominal wall hernia. Bowel beyond the herniated segments are relatively decompressed. There is mesenteric congestion in the associated mesenteric fat which is mild but new. No evidence of bowel necrosis or perforation. There is a moderate volume of colonic stool. Normal appendix.  Retroperitoneum: No mass or adenopathy.  Peritoneum: No ascites or pneumoperitoneum.  Vascular: Prominent tortuosity of the infrarenal aorta, with to 90  degree curves in the upper abdomen.  OSSEOUS: L1 corpectomy with strut graft. There has been progressive subsidence especially into the T12 vertebra where there is lucency around the upper graft. There is doubtful bony fusion across the discectomy level. The inferior portion of the graft continues to the L2 inferior endplate, as prior. No acute fracture. No progressive retropulsion.  IMPRESSION: 1. Partial small bowel obstruction and mesenteric congestion from a large low abdominal wall hernia. 2. L1 corpectomy with strut graft. There is prominent subsidence into the T12 vertebral body with doubtful osseous fusion across the operative level. 3. Cholelithiasis and other chronic findings are noted above.   Electronically Signed   By: JoMonte Fantasia.D.   On: 11/13/2014 03:50    Medications / Allergies:  Scheduled Meds: . antiseptic oral rinse  7 mL Mouth Rinse BID  .  ceFAZolin (ANCEF) IV  2 g Intravenous 3 times per day  . enoxaparin (LOVENOX) injection  40 mg Subcutaneous Q24H  . metoprolol succinate  50 mg Oral QPC supper  . nicotine  21 mg Transdermal Daily  . nystatin   Topical TID  . pantoprazole (PROTONIX) IV  40 mg Intravenous Q24H  . sodium chloride  3 mL Intravenous Q12H   Continuous Infusions: . sodium chloride 1,000 mL (11/13/14 1605)   PRN Meds:.acetaminophen, diphenhydrAMINE **AND** acetaminophen, HYDROmorphone (DILAUDID) injection, methocarbamol, morphine, morphine injection, ondansetron (ZOFRAN) IV, oxyCODONE-acetaminophen, polyethylene glycol  Antibiotics: Anti-infectives    Start     Dose/Rate Route Frequency Ordered Stop   11/13/14 1600  fluconazole (DIFLUCAN) IVPB 150 mg     150 mg 75 mL/hr over 60 Minutes Intravenous  Once 11/13/14 1400 11/13/14 1750   11/13/14 1600  ceFAZolin (ANCEF) IVPB 2 g/50 mL premix    Comments:  Pharmacy may adjust dosing strength, interval, or rate of medication as needed for optimal therapy for the patient  Send with patient on call to the  OR.  Anest[facility] to complete antibiotic administration [facility]mn [facility]   2 g 100 mL/hr over 30 Minutes Intravenous 3 times per day 11/13/14 1400 11/14/14 1359   11/13/14 0845  ceFAZolin (ANCEF) IVPB 2 g/50 mL premix    Comments:  Pharmacy may adjust dosing strength, interval, or rate of medication as needed for optimal therapy for the patient  Send with patient on call to the OR.  Anesthesia to complete antibiotic administration <667m [facility]  2 g 100 mL/hr over 30 Minutes Intravenous On call to O.R. 11/13/14 0841 11/13/14 1000        Assessment/Plan Incisional hernia with obstruction POD#1 exploratory laparotomy with reduction of hernia primary repair of incarcerated incisional hernia with placement of biologic mesh---Dr. WaDonne Hazel-DC NGT and give ice chips -continue foley today until shes able to mobilize better -mobilize -IS -pain control, will transition to PO once able to tolerate clears -atbx Ancef x24h -IVF VTE prophylaxis -SCD/lonovex ITP -appreciate hem/onc assistance -awaiting today labs  EmErby PianANEm[facility] Pager [phone](7A-4:30P) For consults and floor pages call [phone](7A-4:30P)  11/14/2014 8:04 AM

## 2014-11-14 NOTE — Evaluation (Signed)
Physical Therapy Evaluation Patient Details Name: [name] MRN: [id number] DOB: 06-Feb-1951 Today's Date: 11/14/2014   History of Present Illness  Pt is a 64 y.o. female with past medical history of hypertension, GERD, ITP, hysterectomy, C-section x 2, chronic leukocytosis, who presents with abdominal pain. Patient repots that her abdominal pain started one week ago. It has been progressively getting worse. Her abdominal pain is diffuse, constant, 10 out of 10 in severity. She also has nausea and vomiting. She vomited 4-5 times today. It is associated with severe constipation. Pt states that she is taking a lot of percocet post back surgery. Pt states she has tried a laxative and miralax with no relief. She also reports having severe acid reflux symptoms and heartburn. In ED, patient was found to have partial small bowel obstruction and mesenteric congestion from a large low abdominal wall hernia on CT abdomen/pelvis. Patient is now s/p hernial repair on 11/13/14.  Clinical Impression  Pt admitted with above diagnosis. Pt currently with functional limitations due to the deficits listed below (see PT Problem List). At the time of PT eval pt was able to perform transfers and ambulation with min guard assist or min assist. Pt will need further instruction in long roll technique. Pt will benefit from skilled PT to increase their independence and safety with mobility to allow discharge to the venue listed below.       Follow Up Recommendations No PT follow up    Equipment Recommendations  None recommended by PT    Recommendations for Other Services       Precautions / Restrictions Precautions Precautions: Fall Precaution Comments: JP drain Restrictions Weight Bearing Restrictions: No      Mobility  Bed Mobility Overal bed mobility: Needs Assistance Bed Mobility: Rolling;Sidelying to Sit Rolling: Min assist Sidelying to sit: Min assist       General bed mobility comments: Assist  for initiation of rolling and trunk elevation to full sitting position. Pt required cueing for all aspects of log roll technique.   Transfers Overall transfer level: Needs assistance Equipment used: Rolling walker (2 wheeled) Transfers: Sit to/from Stand Sit to Stand: Min guard         General transfer comment: Pt was able to power-up to full standing position without assistance. Increased time required as pt moving very slow and guarded due to pain.   Ambulation/Gait Ambulation/Gait assistance: Min guard Ambulation Distance (Feet): 100 Feet Assistive device: Rolling walker (2 wheeled) Gait Pattern/deviations: Step-through pattern;Decreased stride length;Trunk flexed Gait velocity: Decreased Gait velocity interpretation: Below normal speed for age/gender General Gait Details: Pt ambulating slow and guarded. Fatigues quickly and distance is limited due to pain.   Stairs            Wheelchair Mobility    Modified Rankin (Stroke Patients Only)       Balance Overall balance assessment: No apparent balance deficits (not formally assessed)                                           Pertinent Vitals/Pain Pain Assessment: Faces Faces Pain Scale: Hurts even more Pain Location: Abdomen Pain Descriptors / Indicators: Operative site guarding Pain Intervention(s): Limited activity within patient's tolerance;Monitored during session;Repositioned    Home Living Family/patient expects to be discharged to:: Private residence Living Arrangements: Children Available Help at Discharge: Family;Available PRN/intermittently Type of Home: House Home Access: Stairs  to enter Entrance Stairs-Rails: Right Entrance Stairs-Number of Steps: 3 Home Layout: One level Home Equipment: Walker - 2 wheels Additional Comments: tub shower    Prior Function Level of Independence: Independent               Hand Dominance   Dominant Hand: Right    Extremity/Trunk  Assessment   Upper Extremity Assessment: Defer to OT evaluation           Lower Extremity Assessment: Overall WFL for tasks assessed      Cervical / Trunk Assessment: Other exceptions  Communication   Communication: No difficulties  Cognition Arousal/Alertness: Awake/alert Behavior During Therapy: WFL for tasks assessed/performed Overall Cognitive Status: Within Functional Limits for tasks assessed                      General Comments      Exercises        Assessment/Plan    PT Assessment Patient needs continued PT services  PT Diagnosis Difficulty walking;Acute pain   PT Problem List Decreased strength;Decreased range of motion;Decreased activity tolerance;Decreased balance;Decreased mobility;Decreased knowledge of use of DME;Decreased safety awareness;Decreased knowledge of precautions;Pain  PT Treatment Interventions DME instruction;Gait training;Functional mobility training;Stair training;Therapeutic activities;Therapeutic exercise;Neuromuscular re-education;Patient/family education   PT Goals (Current goals can be found in the Care Plan section) Acute Rehab PT Goals Patient Stated Goal: Decrease her pain PT Goal Formulation: With patient Time For Goal Achievement: 11/21/14 Potential to Achieve Goals: Good    Frequency Min 3X/week   Barriers to discharge        Co-evaluation               End of Session Equipment Utilized During Treatment: Gait belt Activity Tolerance: Patient limited by pain Patient left: in chair;with call bell/phone within reach Nurse Communication: Mobility status         Time: 0092-3300 PT Time Calculation (min) (ACUTE ONLY): 32 min   Charges:   PT Evaluation $Initial PT Evaluation Tier I: 1 Procedure PT Treatments $Gait Training: 8-22 mins   PT G Codes:        Rolinda Roan 2014/12/06, 3:01 PM   Rolinda Roan, PT, DPT Acute Rehabilitation Services Pager: [phone]

## 2014-11-15 LAB — CBC WITH DIFFERENTIAL/PLATELET
Basophils Absolute: 0 10*3/uL (ref 0.0–0.1)
Basophils Relative: 0 % (ref 0–1)
Eosinophils Absolute: 0.5 10*3/uL (ref 0.0–0.7)
Eosinophils Relative: 4 % (ref 0–5)
HCT: 30.2 % — ABNORMAL LOW (ref 36.0–46.0)
Hemoglobin: 10 g/dL — ABNORMAL LOW (ref 12.0–15.0)
Lymphocytes Relative: 10 % — ABNORMAL LOW (ref 12–46)
Lymphs Abs: 1.2 10*3/uL (ref 0.7–4.0)
MCH: 29.2 pg (ref 26.0–34.0)
MCHC: 33.1 g/dL (ref 30.0–36.0)
MCV: 88 fL (ref 78.0–100.0)
Monocytes Absolute: 0.8 10*3/uL (ref 0.1–1.0)
Monocytes Relative: 7 % (ref 3–12)
Neutro Abs: 9 10*3/uL — ABNORMAL HIGH (ref 1.7–7.7)
Neutrophils Relative %: 79 % — ABNORMAL HIGH (ref 43–77)
Platelets: 46 10*3/uL — ABNORMAL LOW (ref 150–400)
RBC: 3.43 MIL/uL — ABNORMAL LOW (ref 3.87–5.11)
RDW: 14.3 % (ref 11.5–15.5)
WBC: 11.4 10*3/uL — ABNORMAL HIGH (ref 4.0–10.5)

## 2014-11-15 LAB — GLUCOSE, CAPILLARY: Glucose-Capillary: 98 mg/dL (ref 70–99)

## 2014-11-15 NOTE — Progress Notes (Signed)
Patient refused Nicotine patch. Foley discontinued without any difficulty.

## 2014-11-15 NOTE — Progress Notes (Signed)
[name]   DOB:1951/04/13   KZ#:[id number]   TDD#:[id number]  Patient Care Team: [name], NP as PCP - General Heath Lark, MD as Consulting Physician (Hematology and Oncology) Kristeen Miss, MD as Consulting Physician (Neurosurgery)  I have seen the patient, examined her and edited the notes as follows  Subjective: Patient seen and examined. She is recovering from surgery. Denies fevers, chills, night sweats, vision changes, or mucositis. Denies any respiratory complaints. Denies any chest pain or palpitations. Denies lower extremity swelling. Denies nausea, heartburn. She has no flatus yet, or bowel movements. She is on clear liquids. Denies any dysuria. Denies abnormal skin rashes, or neuropathy. Denies any bleeding issues such as epistaxis, hematemesis, hematuria or hematochezia. She has not ambulated yet.  Scheduled Meds: . antiseptic oral rinse  7 mL Mouth Rinse BID  . enoxaparin (LOVENOX) injection  40 mg Subcutaneous Q24H  . metoprolol succinate  50 mg Oral QPC supper  . nicotine  21 mg Transdermal Daily  . nystatin   Topical TID  . pantoprazole (PROTONIX) IV  40 mg Intravenous Q24H  . sodium chloride  3 mL Intravenous Q12H   Continuous Infusions: . dextrose 5 % and 0.45 % NaCl with KCl 20 mEq/L 100 mL/hr at 11/14/14 2124   PRN Meds:acetaminophen (TYLENOL) oral liquid 160 mg/5 mL, acetaminophen, diphenhydrAMINE **AND** acetaminophen, HYDROmorphone (DILAUDID) injection, methocarbamol, morphine, morphine injection, ondansetron (ZOFRAN) IV, oxyCODONE-acetaminophen   Objective:  Filed Vitals:   11/15/14 0500  BP: 127/64  Pulse: 93  Temp: 98.8 F (37.1 C)  Resp: 16      Intake/Output Summary (Last 24 hours) at 11/15/14 0813 Last data filed at 11/15/14 0500  Gross per 24 hour  Intake    821 ml  Output   3035 ml  Net  -2214 ml    ECOG PERFORMANCE STATUS: 2-3  GENERAL:alert, no distress and comfortable SKIN: skin color, texture, turgor are normal, no rashes  or significant lesions EYES: normal, conjunctiva are pink and non-injected, sclera clear OROPHARYNX:no exudate, no erythema and lips, buccal mucosa, and tongue normal  NECK: supple, thyroid normal size, non-tender, without nodularity LYMPH:  no palpable lymphadenopathy in the cervical, axillary or inguinal LUNGS: clear to auscultation and percussion with normal breathing effort HEART: regular rate & rhythm and no murmurs and no lower extremity edema ABDOMEN: soft, non-tender and normal bowel sounds. Drain in situ Musculoskeletal:no cyanosis of digits and no clubbing  PSYCH: alert & oriented x 3 with fluent speech NEURO: no focal motor/sensory deficits    CBG (last 3)   Recent Labs  11/13/14 0819 11/14/14 0755  GLUCAP 122* 103*     Labs:  CBC Latest Ref Rng 11/15/2014 11/14/2014 11/12/2014  WBC 4.0 - 10.5 K/uL 11.4(H) 18.2(H) 30.5(H)  Hemoglobin 12.0 - 15.0 g/dL 10.0(L) 11.2(L) 16.7(H)  Hematocrit 36.0 - 46.0 % 30.2(L) 33.9(L) 47.3(H)  Platelets 150 - 400 K/uL 46(L) 63(L) 122(L)   Chemistries:   Recent Labs Lab 11/12/14 2316 11/14/14 0710  NA 137 140  K 4.0 4.1  CL 98 102  CO2 23 26  GLUCOSE 218* 112*  BUN 18 11  CREATININE 0.82 0.62  CALCIUM 10.0 8.5  AST 30 15  ALT 62* 22  ALKPHOS 133* 65  BILITOT 1.1 1.5*    GFR Estimated Creatinine Clearance: 63.4 mL/min (by C-G formula based on Cr of 0.62).  Liver Function Tests:  Recent Labs Lab 11/12/14 2316 11/14/14 0710  AST 30 15  ALT 62* 22  ALKPHOS 133* 65  BILITOT  1.1 1.5*  PROT 7.3 4.9*  ALBUMIN 5.1 2.9*    Recent Labs Lab 11/12/14 2316  LIPASE 28    Urine Studies     Component Value Date/Time   COLORURINE AMBER* 11/13/2014 0233   APPEARANCEUR CLEAR 11/13/2014 0233   LABSPEC 1.033* 11/13/2014 0233   PHURINE 5.0 11/13/2014 0233   GLUCOSEU NEGATIVE 11/13/2014 0233   HGBUR NEGATIVE 11/13/2014 0233   BILIRUBINUR SMALL* 11/13/2014 0233   KETONESUR 40* 11/13/2014 0233   PROTEINUR 30* 11/13/2014  0233   UROBILINOGEN 0.2 11/13/2014 0233   NITRITE NEGATIVE 11/13/2014 0233   LEUKOCYTESUR NEGATIVE 11/13/2014 0233    Coagulation profile  Recent Labs Lab 11/13/14 0850  INR 0.99   CBG:  Recent Labs Lab 11/13/14 0819 11/14/14 0755  GLUCAP 122* 103*   Microbiology Cultures pending   Imaging Studies:  No results found.  Assessment/Plan: 64 y.o.  History of ITP Patient is steroid refractory. She is s/p Nplate, discontinued on 04/10/14 as she had good response to treatment Platelets on admission were 122,000, within her baseline range (100-120k) She was admitted for incarcerated hernia repair. She had her surgery without any postoperative bleeding complications and did not require platelet transfusions. Will continue to monitor. No transfusion unless platelet <45,000. Would hold dose of Lovenox if her platelet count drops to less than 50,000; no bleeding issues are reported.  Leukocytosis This is chronic, in the setting of recent surgery, pain, inflammation and a history of tobacco abuse. She is afebrile.  This is trending down to normal. No intervention is indicated at this time, will continue to follow.   Partial bowel obstruction Incarcerated hernia She is now s/p exploratory laparotomy with reduction of hernia, and primary repair of incisional hernia by Dr. Donne Hazel on 11/13/14 She tolerated surgery well, with minimal blood loss No transfusion of platelets was indicated perioperatively  Severe constipation Due to Partial bowel obstruction/ Incarcerated hernia She has not had a bowel movement or flatus since surgery, but this is being treated conservatively, and diet has advanced to clear liquid She will need Miralax and stool softeners due to increased need for pain meds after surgery Appreciate Surgery involvement  Subconjunctival hemorrhage, resolved Due to increased straining due to severe constipation  Resolved  DVT prophylaxis She is on  Lovenox Hold if platelets drop to less than 50,000 at which time recommend mechanical devices  Full Code  Other medical issues as per admitting team  I will return to check on her Friday. She is OK not to get transfused today  **Disclaimer: This note was dictated with voice recognition software. Similar sounding words can inadvertently be transcribed and this note may contain transcription errors which may not have been corrected upon publication of note.Sharene Butters E, PA-C 11/15/2014  8:13 AM Anushri Casalino, MD 11/15/2014

## 2014-11-15 NOTE — Progress Notes (Signed)
Patient ID: [name], female   DOB: 1950-11-07, 64 y.o.   MRN: [id number]     [address] [place], [place] [address]    Phone: [phone] FAX: [phone]     Subjective: No flatus yet.  VSS.  Afebrile.  76m from JP drain. Tolerated sips from floor.   Objective:  Vital signs:  Filed Vitals:   11/14/14 1703 11/14/14 2100 11/15/14 0500 11/15/14 0950  BP: 129/70 109/62 127/64 131/58  Pulse: 102 101 93 86  Temp: 99.3 F (37.4 C) 99.7 F (37.6 C) 98.8 F (37.1 C) 98.7 F (37.1 C)  TempSrc: Oral Oral Oral Oral  Resp: _0 Height:      Weight:  168 lb 6.4 oz (76.386 kg)    SpO2: 98% 94% 98% 99%       Intake/Output   Yesterday:  03/01 0701 - 03/02 0700 In: 8259[I.V.:820] Out: 3035 [Urine:2325; Emesis/NG output:700; Drains:10] This shift:    I/O last 3 completed shifts: In: 4129.3 [I.V.:4028.3; Other:1; IV Piggyback:100] Out: 3035 [Urine:2325; Emesis/NG output:700; Drains:10]    Physical Exam: General: Pt awake/alert/oriented x4 in no acute distress Chest: cta. No chest wall pain w good excursion CV: s1s2 rrr, no murmurs gallops or rubs.  MS: Normal AROM mjr joints. No obvious deformity Abdomen: Soft. Nondistended. Appropriately tender. Midline incision-dressing removed, staples in place, minimal serous output inferior of the wound.   JP drain with serosanguinous output. No evidence of peritonitis. Ext: SCDs BLE. No mjr edema. No cyanosis Skin: No petechiae / purpura   Problem List:   Principal Problem:   SOB (shortness of breath) Active Problems:   HTN (hypertension)   ITP (idiopathic thrombocytopenic purpura)   Tobacco abuse   Leukocytosis   Abdominal pain   Sepsis   GERD (gastroesophageal reflux disease)   Constipation   SBO (small bowel obstruction)    Results:   Labs: Results for orders placed or performed during the hospital encounter of 11/13/14  (from the past 48 hour(s))  APTT     Status: None   Collection Time: 11/13/14  3:20 PM  Result Value Ref Range   aPTT 28 24 - 37 seconds  Comprehensive metabolic panel     Status: Abnormal   Collection Time: 11/14/14  7:10 AM  Result Value Ref Range   Sodium 140 135 - 145 mmol/L   Potassium 4.1 3.5 - 5.1 mmol/L   Chloride 102 96 - 112 mmol/L   CO2 26 19 - 32 mmol/L   Glucose, Bld 112 (H) 70 - 99 mg/dL   BUN 11 6 - 23 mg/dL   Creatinine, Ser 0.62 0.50 - 1.10 mg/dL   Calcium 8.5 8.4 - 10.5 mg/dL   Total Protein 4.9 (L) 6.0 - 8.3 g/dL   Albumin 2.9 (L) 3.5 - 5.2 g/dL   AST 15 0 - 37 U/L   ALT 22 0 - 35 U/L   Alkaline Phosphatase 65 39 - 117 U/L   Total Bilirubin 1.5 (H) 0.3 - 1.2 mg/dL   GFR calc non Af Amer >90 >90 mL/min   GFR calc Af Amer >90 >90 mL/min    Comment: (NOTE) The eGFR has been calculated using the CKD EPI equation. This calculation has not been validated in all clinical situations. eGFR's persistently <90 mL/min signify possible Chronic Kidney Disease.    Anion gap 12 5 - 15  CBC  Status: Abnormal   Collection Time: 11/14/14  7:10 AM  Result Value Ref Range   WBC 18.2 (H) 4.0 - 10.5 K/uL   RBC 3.85 (L) 3.87 - 5.11 MIL/uL   Hemoglobin 11.2 (L) 12.0 - 15.0 g/dL    Comment: SPECIMEN CHECKED FOR CLOTS REPEATED TO VERIFY    HCT 33.9 (L) 36.0 - 46.0 %   MCV 88.1 78.0 - 100.0 fL   MCH 29.1 26.0 - 34.0 pg   MCHC 33.0 30.0 - 36.0 g/dL   RDW 14.6 11.5 - 15.5 %   Platelets 63 (L) 150 - 400 K/uL    Comment: PLATELET COUNT CONFIRMED BY SMEAR REPEATED TO VERIFY   Glucose, capillary     Status: Abnormal   Collection Time: 11/14/14  7:55 AM  Result Value Ref Range   Glucose-Capillary 103 (H) 70 - 99 mg/dL  Glucose, capillary     Status: None   Collection Time: 11/15/14  7:49 AM  Result Value Ref Range   Glucose-Capillary 98 70 - 99 mg/dL    Imaging / Studies: No results found.  Medications / Allergies:  Scheduled Meds: . antiseptic oral rinse  7 mL  Mouth Rinse BID  . enoxaparin (LOVENOX) injection  40 mg Subcutaneous Q24H  . metoprolol succinate  50 mg Oral QPC supper  . nicotine  21 mg Transdermal Daily  . nystatin   Topical TID  . pantoprazole (PROTONIX) IV  40 mg Intravenous Q24H  . sodium chloride  3 mL Intravenous Q12H   Continuous Infusions: . dextrose 5 % and 0.45 % NaCl with KCl 20 mEq/L 100 mL/hr (11/15/14 0953)   PRN Meds:.acetaminophen (TYLENOL) oral liquid 160 mg/5 mL, acetaminophen, diphenhydrAMINE **AND** acetaminophen, HYDROmorphone (DILAUDID) injection, methocarbamol, morphine, morphine injection, ondansetron (ZOFRAN) IV, oxyCODONE-acetaminophen  Antibiotics: Anti-infectives    Start     Dose/Rate Route Frequency Ordered Stop   11/13/14 1600  fluconazole (DIFLUCAN) IVPB 150 mg     150 mg 75 mL/hr over 60 Minutes Intravenous  Once 11/13/14 1400 11/13/14 1750   11/13/14 1600  ceFAZolin (ANCEF) IVPB 2 g/50 mL premix    Comments:  Pharmacy may adjust dosing strength, interval, or rate of medication as needed for optimal therapy for the patient  Send with patient on call to the OR.  Anesthesia to complete antibiotic administration <83mn [facility]   2 g 100 mL/hr over 30 Minutes Intravenous 3 times per day 11/13/14 1400 11/14/14 1359   11/13/14 0845  ceFAZolin (ANCEF) IVPB 2 g/50 mL premix    Comments:  Pharmacy may adjust dosing strength, interval, or rate of medication as needed for optimal therapy for the patient  Send with patient on call to the OR.  Anesthesia to complete antibiotic administration <663m [facility]  2 g 100 mL/hr over 30 Minutes Intravenous On call to O.R. 11/13/14 0841 11/13/14 1000        Assessment/Plan Incisional hernia with obstruction POD#2 exploratory laparotomy with reduction of hernia primary repair of incarcerated incisional hernia with placement of biologic mesh---Dr. WaDonne Hazel-clears today and await bowel function -DC  foley, bladder scan in 6 hours if pt does not void and call with results--order written -mobilize -IS -pain control, will transition to PO once able to tolerate clears -atbx Ancef x24h -reduce IVF, will DC once tolerating PO adequately  VTE prophylaxis -SCD/lovenox ITP -appreciate hem/onc assistance -PLT 63k yesterday, awaiting today CBC  EmErby PianANP-BC [facility] Pager  [phone](7A-4:30P) For consults and floor pages call [phone](7A-4:30P)  11/15/2014 10:40 AM

## 2014-11-15 NOTE — Progress Notes (Signed)
Patient has been voiding without any complication.  Urine output from the time foley removed is 700 ml.  Bladder scan not done as patient is voiding.

## 2014-11-16 ENCOUNTER — Other Ambulatory Visit: Payer: Self-pay | Admitting: Hematology and Oncology

## 2014-11-16 DIAGNOSIS — D72828 Other elevated white blood cell count: Secondary | ICD-10-CM

## 2014-11-16 DIAGNOSIS — K430 Incisional hernia with obstruction, without gangrene: Secondary | ICD-10-CM

## 2014-11-16 DIAGNOSIS — K5900 Constipation, unspecified: Secondary | ICD-10-CM

## 2014-11-16 LAB — CBC
HCT: 31.6 % — ABNORMAL LOW (ref 36.0–46.0)
Hemoglobin: 10.4 g/dL — ABNORMAL LOW (ref 12.0–15.0)
MCH: 29.1 pg (ref 26.0–34.0)
MCHC: 32.9 g/dL (ref 30.0–36.0)
MCV: 88.3 fL (ref 78.0–100.0)
Platelets: 59 10*3/uL — ABNORMAL LOW (ref 150–400)
RBC: 3.58 MIL/uL — ABNORMAL LOW (ref 3.87–5.11)
RDW: 13.8 % (ref 11.5–15.5)
WBC: 10.9 10*3/uL — ABNORMAL HIGH (ref 4.0–10.5)

## 2014-11-16 MED ORDER — PANTOPRAZOLE SODIUM 40 MG PO TBEC
40.0000 mg | DELAYED_RELEASE_TABLET | Freq: Every day | ORAL | Status: DC
  Administered 2014-11-16: 40 mg via ORAL
  Filled 2014-11-16: qty 1

## 2014-11-16 MED ORDER — WHITE PETROLATUM GEL
Status: AC
  Administered 2014-11-16: 11:00:00
  Filled 2014-11-16: qty 1

## 2014-11-16 MED ORDER — POLYETHYLENE GLYCOL 3350 17 G PO PACK
17.0000 g | PACK | Freq: Every day | ORAL | Status: DC
  Administered 2014-11-16 – 2014-11-17 (×2): 17 g via ORAL
  Filled 2014-11-16 (×2): qty 1

## 2014-11-16 NOTE — Progress Notes (Signed)
Patient ID: [name], female   DOB: [birth year], 64 y.o.   MRN: [id number]     [address] [place], [place] [address]    Phone: [phone] FAX: [phone]     Subjective: Still sore.  Ambulating more.  Tolerating clears.  Passing flatus.  Voiding.   Objective:  Vital signs:  Filed Vitals:   11/15/14 0950 11/15/14 1751 11/15/14 2014 11/16/14 0548  BP: 131/58 130/66 142/72 140/66  Pulse: 86 88 87 89  Temp: 98.7 F (37.1 C) 98.4 F (36.9 C) 98.4 F (36.9 C) 98.6 F (37 C)  TempSrc: Oral Oral    Resp: 18 18 18 18   Height:      Weight:   161 lb (73.029 kg)   SpO2: 99% 99% 93% 91%       Intake/Output   Yesterday:  03/02 0701 - 03/03 0700 In: 3183.3 [P.O.:600; I.V.:2583.3] Out: 3145 [Urine:3145] This shift:    I/O last 3 completed shifts: In: 3183.3 [P.O.:600; I.V.:2583.3] Out: 4345 [Urine:4345]    Physical Exam: General: Pt awake/alert/oriented x4 in no acute distress Chest: cta. No chest wall pain w good excursion CV: s1s2 rrr, no murmurs gallops or rubs.  MS: Normal AROM mjr joints. No obvious deformity Abdomen: Soft. Nondistended. Appropriately tender. Midline incision-dressing removed, staples in place, minimal serous output inferior of the wound.  JP drain with serosanguinous output. No evidence of peritonitis. Ext: SCDs BLE. No mjr edema. No cyanosis Skin: No petechiae / purpura   Problem List:   Principal Problem:   SOB (shortness of breath) Active Problems:   HTN (hypertension)   ITP (idiopathic thrombocytopenic purpura)   Tobacco abuse   Leukocytosis   Abdominal pain   Sepsis   GERD (gastroesophageal reflux disease)   Constipation   SBO (small bowel obstruction)    Results:   Labs: Results for orders placed or performed during the hospital encounter of 11/13/14 (from the past 48 hour(s))  Glucose, capillary     Status: None   Collection Time: 11/15/14   7:49 AM  Result Value Ref Range   Glucose-Capillary 98 70 - 99 mg/dL  CBC with Differential     Status: Abnormal   Collection Time: 11/15/14 10:25 AM  Result Value Ref Range   WBC 11.4 (H) 4.0 - 10.5 K/uL   RBC 3.43 (L) 3.87 - 5.11 MIL/uL   Hemoglobin 10.0 (L) 12.0 - 15.0 g/dL   HCT 30.2 (L) 36.0 - 46.0 %   MCV 88.0 78.0 - 100.0 fL   MCH 29.2 26.0 - 34.0 pg   MCHC 33.1 30.0 - 36.0 g/dL   RDW 14.3 11.5 - 15.5 %   Platelets 46 (L) 150 - 400 K/uL    Comment: REPEATED TO VERIFY CONSISTENT WITH PREVIOUS RESULT    Neutrophils Relative % 79 (H) 43 - 77 %   Neutro Abs 9.0 (H) 1.7 - 7.7 K/uL   Lymphocytes Relative 10 (L) 12 - 46 %   Lymphs Abs 1.2 0.7 - 4.0 K/uL   Monocytes Relative 7 3 - 12 %   Monocytes Absolute 0.8 0.1 - 1.0 K/uL   Eosinophils Relative 4 0 - 5 %   Eosinophils Absolute 0.5 0.0 - 0.7 K/uL   Basophils Relative 0 0 - 1 %   Basophils Absolute 0.0 0.0 - 0.1 K/uL  CBC     Status: Abnormal   Collection Time: 11/16/14  3:44 AM  Result  Value Ref Range   WBC 10.9 (H) 4.0 - 10.5 K/uL   RBC 3.58 (L) 3.87 - 5.11 MIL/uL   Hemoglobin 10.4 (L) 12.0 - 15.0 g/dL   HCT 31.6 (L) 36.0 - 46.0 %   MCV 88.3 78.0 - 100.0 fL   MCH 29.1 26.0 - 34.0 pg   MCHC 32.9 30.0 - 36.0 g/dL   RDW 13.8 11.5 - 15.5 %   Platelets 59 (L) 150 - 400 K/uL    Comment: SPECIMEN CHECKED FOR CLOTS REPEATED TO VERIFY PLATELET COUNT CONFIRMED BY SMEAR     Imaging / Studies: No results found.  Medications / Allergies:  Scheduled Meds: . antiseptic oral rinse  7 mL Mouth Rinse BID  . metoprolol succinate  50 mg Oral QPC supper  . nicotine  21 mg Transdermal Daily  . nystatin   Topical TID  . pantoprazole (PROTONIX) IV  40 mg Intravenous Q24H  . sodium chloride  3 mL Intravenous Q12H   Continuous Infusions: . dextrose 5 % and 0.45 % NaCl with KCl 20 mEq/L 50 mL/hr (11/15/14 1120)   PRN Meds:.acetaminophen (TYLENOL) oral liquid 160 mg/5 mL, acetaminophen, diphenhydrAMINE **AND** acetaminophen,  HYDROmorphone (DILAUDID) injection, methocarbamol, morphine, morphine injection, ondansetron (ZOFRAN) IV, oxyCODONE-acetaminophen  Antibiotics: Anti-infectives    Start     Dose/Rate Route Frequency Ordered Stop   11/13/14 1600  fluconazole (DIFLUCAN) IVPB 150 mg     150 mg 75 mL/hr over 60 Minutes Intravenous  Once 11/13/14 1400 11/13/14 1750   11/13/14 1600  ceFAZolin (ANCEF) IVPB 2 g/50 mL premix    Comments:  Pharmacy may adjust dosing strength, interval, or rate of medication as needed for optimal therapy for the patient  Send with patient on call to the OR.  Anesthesia to complete antibiotic administration <46min prior to incision per [facility].   2 g 100 mL/hr over 30 Minutes Intravenous 3 times per day 11/13/14 1400 11/14/14 1359   11/13/14 0845  ceFAZolin (ANCEF) IVPB 2 g/50 mL premix    Comments:  Pharmacy may adjust dosing strength, interval, or rate of medication as needed for optimal therapy for the patient  Send with patient on call to the OR.  Anesthesia to complete antibiotic administration <20min prior to incision per [facility].   2 g 100 mL/hr over 30 Minutes Intravenous On call to O.R. 11/13/14 0841 11/13/14 1000       Assessment/Plan Incisional hernia with obstruction POD#3 exploratory laparotomy with reduction of hernia primary repair of incarcerated incisional hernia with placement of biologic mesh---Dr. Donne Hazel  -FL diet, advance as tolerated -add miralax -mobilize -IS -c/w morphine SR, percocet, change dilaudid for breakthrough pain only -DC IVF -plan to leave her drain in, teach how to record and empty.  Once <30cc in 24h, nurse visit to have the drain removed(7ml out on POD#1, no output recorded yesterday, less than 10cc in bulb today and is serosanguinous) VTE prophylaxis -SCD, lovenox stopped due to low platelets ITP -appreciate hem/onc assistance -PLT improved today 46--->59k Dispo--work on pain control today and advancing her diet,  anticipate discharge tomorrow  Erby Pian, [facility] Surgery Pager [phone](7A-4:30P) For consults and floor pages call [phone](7A-4:30P)  11/16/2014 8:35 AM

## 2014-11-16 NOTE — Progress Notes (Signed)
Physical Therapy Treatment Patient Details Name: [name] MRN: [id number] DOB: 1950-10-29 Today's Date: 11/16/2014    History of Present Illness Pt is a 64 y.o. female with past medical history of hypertension, GERD, ITP, hysterectomy, C-section x 2, chronic leukocytosis, who presents with abdominal pain. Patient repots that her abdominal pain started one week ago. It has been progressively getting worse. Her abdominal pain is diffuse, constant, 10 out of 10 in severity. She also has nausea and vomiting. She vomited 4-5 times today. It is associated with severe constipation. Pt states that she is taking a lot of percocet post back surgery. Pt states she has tried a laxative and miralax with no relief. She also reports having severe acid reflux symptoms and heartburn. In ED, patient was found to have partial small bowel obstruction and mesenteric congestion from a large low abdominal wall hernia on CT abdomen/pelvis. Patient is now s/p hernial repair on 11/13/14.    PT Comments    Pt progressing towards physical therapy goals. Was able to improve ambulation distance this session with no reports of increased pain. Pt declined stair training. Discussed some ways to safely enter her tall bed, but feel pt needs to practice bed mobility again prior to d/c. Will continue to follow.   Follow Up Recommendations  No PT follow up     Equipment Recommendations  None recommended by PT    Recommendations for Other Services       Precautions / Restrictions Precautions Precautions: Fall Precaution Comments: JP drain Restrictions Weight Bearing Restrictions: No    Mobility  Bed Mobility Overal bed mobility: Needs Assistance Bed Mobility: Rolling;Sidelying to Sit;Sit to Sidelying Rolling: Supervision Sidelying to sit: Min guard     Sit to sidelying: Min assist General bed mobility comments: Pt with difficulty transitioning sit>sidelying this session. Reports that she has been crawling into  bed at home after her back surgery and has not been successful with the log roll.   Transfers Overall transfer level: Needs assistance Equipment used: Rolling walker (2 wheeled) Transfers: Sit to/from Stand Sit to Stand: Min guard         General transfer comment: Pt was able to power-up to full standing position without assistance. Increased time required as pt moving very slow and guarded due to pain.   Ambulation/Gait Ambulation/Gait assistance: Supervision Ambulation Distance (Feet): 360 Feet Assistive device: Rolling walker (2 wheeled) Gait Pattern/deviations: Step-through pattern;Decreased stride length;Narrow base of support Gait velocity: Decreased Gait velocity interpretation: Below normal speed for age/gender General Gait Details: 2 brief standing rest breaks due to fatigue.    Stairs            Wheelchair Mobility    Modified Rankin (Stroke Patients Only)       Balance Overall balance assessment: No apparent balance deficits (not formally assessed)                                  Cognition Arousal/Alertness: Awake/alert Behavior During Therapy: WFL for tasks assessed/performed Overall Cognitive Status: Within Functional Limits for tasks assessed                      Exercises      General Comments        Pertinent Vitals/Pain Pain Assessment: Faces Faces Pain Scale: Hurts little more Pain Location: Abdomen and back Pain Intervention(s): Limited activity within patient's tolerance;Monitored during session;Repositioned  Home Living                      Prior Function            PT Goals (current goals can now be found in the care plan section) Acute Rehab PT Goals Patient Stated Goal: Decrease her pain PT Goal Formulation: With patient Time For Goal Achievement: 11/21/14 Potential to Achieve Goals: Good Progress towards PT goals: Progressing toward goals    Frequency  Min 3X/week    PT Plan  Current plan remains appropriate    Co-evaluation             End of Session Equipment Utilized During Treatment: Gait belt Activity Tolerance: Patient tolerated treatment well Patient left: with call bell/phone within reach;in bed     Time: 1431-1458 PT Time Calculation (min) (ACUTE ONLY): 27 min  Charges:  $Gait Training: 8-22 mins $Therapeutic Activity: 8-22 mins                    G Codes:      Rolinda Roan 12/01/2014, 3:12 PM  Rolinda Roan, PT, DPT Acute Rehabilitation Services Pager: [phone]

## 2014-11-16 NOTE — Progress Notes (Signed)
[name]   DOB:Feb 21, 1951   QT#:[id number]    Subjective:  Patient seen and examined. She is recovering from surgery. Denies fevers, chills, night sweats, vision changes, or mucositis. Denies any respiratory complaints. Denies any chest pain or palpitations. Denies lower extremity swelling. Denies nausea, heartburn. She has bowel movement, advancing diet. Denies any bleeding issues such as epistaxis, hematemesis, hematuria or hematochezia.  Objective:  Filed Vitals:   11/16/14 1000  BP: 138/81  Pulse: 99  Temp: 98.6 F (37 C)  Resp: 18     Intake/Output Summary (Last 24 hours) at 11/16/14 1616 Last data filed at 11/16/14 0900  Gross per 24 hour  Intake 3183.33 ml  Output   2345 ml  Net 838.33 ml    GENERAL:alert, no distress and comfortable SKIN: skin color, texture, turgor are normal, no rashes or significant lesions EYES: normal, Conjunctiva are pink and non-injected, sclera clear OROPHARYNX:no exudate, no erythema and lips, buccal mucosa, and tongue normal  NECK: supple, thyroid normal size, non-tender, without nodularity LYMPH:  no palpable lymphadenopathy in the cervical, axillary or inguinal LUNGS: clear to auscultation and percussion with normal breathing effort HEART: regular rate & rhythm and no murmurs and no lower extremity edema ABDOMEN:abdomen soft, non-tender and normal bowel sounds. Surgical drain in situ Musculoskeletal:no cyanosis of digits and no clubbing  NEURO: alert & oriented x 3 with fluent speech, no focal motor/sensory deficits   Labs:  Lab Results  Component Value Date   WBC 10.9* 11/16/2014   HGB 10.4* 11/16/2014   HCT 31.6* 11/16/2014   MCV 88.3 11/16/2014   PLT 59* 11/16/2014   NEUTROABS 9.0* 11/15/2014    Lab Results  Component Value Date   NA 140 11/14/2014   K 4.1 11/14/2014   CL 102 11/14/2014   CO2 26 11/14/2014   Assessment & Plan:   History of ITP Patient is steroid refractory. She is s/p Nplate, discontinued on 04/10/14  as she had good response to treatment Platelets on admission were 122,000, within her baseline range (100-120k) She was admitted for incarcerated hernia repair. She had her surgery without any postoperative bleeding complications and did not require platelet transfusions. Will continue to monitor. No transfusion unless platelet <45,000. No bleeding issues are reported. Platelet count is better  Leukocytosis This is chronic, in the setting of recent surgery, pain, inflammation and a history of tobacco abuse. She is afebrile.  This is trending down to normal. No intervention is indicated at this time, will continue to follow.   Partial bowel obstruction Incarcerated hernia She is now s/p exploratory laparotomy with reduction of hernia, and primary repair of incisional hernia by Dr. Donne Hazel on 11/13/14 She tolerated surgery well, with minimal blood loss No transfusion of platelets was indicated perioperatively  Severe constipation, resolving Due to Partial bowel obstruction/ Incarcerated hernia She has bowel movement or flatus since surgery and diet has advanced to clear liquid She will need Miralax and stool softeners due to increased need for pain meds after surgery Appreciate Surgery involvement  Subconjunctival hemorrhage, resolved Due to increased straining due to severe constipation  Resolved  DVT prophylaxis She is on Lovenox Hold if platelets drop to less than 50,000 at which time recommend mechanical devices  Full Code  Other medical issues as per admitting team  Will sign off. Return appt to recheck platelet count in 1 month is arranged  [facility], [place], MD 11/16/2014  4:16 PM

## 2014-11-17 ENCOUNTER — Telehealth: Payer: Self-pay | Admitting: Hematology and Oncology

## 2014-11-17 MED ORDER — IBUPROFEN 600 MG PO TABS
600.0000 mg | ORAL_TABLET | Freq: Three times a day (TID) | ORAL | Status: DC
  Filled 2014-11-17 (×4): qty 1

## 2014-11-17 MED ORDER — IBUPROFEN 600 MG PO TABS: 600.0000 mg | ORAL_TABLET | Freq: Three times a day (TID) | ORAL | Status: AC

## 2014-11-17 MED ORDER — POLYETHYLENE GLYCOL 3350 17 G PO PACK: 17.0000 g | PACK | Freq: Every day | ORAL | Status: DC

## 2014-11-17 MED ORDER — OXYCODONE-ACETAMINOPHEN 5-325 MG PO TABS: 1.0000 | ORAL_TABLET | ORAL | Status: DC | PRN

## 2014-11-17 NOTE — Discharge Summary (Signed)
Physician Discharge Summary  [name] GYI:[id number] DOB: 1951/05/08 DOA: 11/13/2014  PCP: Imelda Pillow, NP  Consultation: heme/onc---Dr. Alvy Bimler  Admit date: 11/13/2014 Discharge date: 11/17/2014  Recommendations for Outpatient Follow-up:   Follow-up Information    Follow up with [facility], [facility] On 11/28/2014.   Specialty:  General Surgery   Why:  arrive no later than 11:15AM for post op check with your surgeon   Contact information:   [place] [address] [phone]       Follow up with Rolm Bookbinder, MD On 11/24/2014.   Specialty:  General Surgery   Why:  arrive by 2:30pm to have your staples removed.    Contact information:   [address] [place] [address] [phone]       Follow up with [facility], MD.   Specialty:  Hematology and Oncology   Contact information:   [place] [address] [phone]      Discharge Diagnoses:  1. Incisional hernia with obstruction 2. ITP   Surgical Procedure: exploratory laparotomy with reduction of hernia primary repair of incarcerated incisional hernia with placement of biologic mesh---Dr. Donne Hazel   Discharge Condition: stable Disposition: home  Diet recommendation: regular  Filed Weights   11/13/14 2100 11/14/14 2100 11/15/14 2014  Weight: 170 lb 3.1 oz (77.2 kg) 168 lb 6.4 oz (76.386 kg) 161 lb (73.029 kg)    Filed Vitals:   11/17/14 0939  BP: 133/74  Pulse: 90  Temp: 98.7 F (37.1 C)  Resp: 18      Hospital Course:  [name] is a 64 y.o. female with past medical history of hypertension, GERD, ITP, hysterectomy, C-section x 2, chronic leukocytosis, who presented with abdominal pain.  The patient was found to have partial small bowel obstruction and mesenteric congestion from a large low abdominal wall hernia on CT abdomen/pelvis; lipase 28, lactate 3.01, normal temperature, tachycardia, worsening leukocytosis,  stable platelets. After hematology consultation, the patient underwent the procedure listed above.  She tolerated the surgery well and was transferred to the floor.   On POD#2 her platelets dropped to 46k, lovenox was stopped, but no other intervention was needed.  The patient was mobilized, pain medication was adjusted, foley was removed and she voided without any difficulties.  On POD#4 the patient was tolerating a diet, having BMs, ambulating, pain controlled and therefore felt stable for discharge.  She was instructed on drain care and recording.  A follow up appointment was made for next Friday, plan to remove JP drain if <30cc for 2 days, also remove staples at this time.  A follow up appt has also been made with Dr. Donne Hazel and the patient is aware.  Medication risks, benefits and therapeutic alternatives were reviewed with the patient.  She verbalizes understanding. She was encouraged to call with questions or concerns.    Discharge Instructions     Medication List    TAKE these medications        acetaminophen 500 MG tablet  Commonly known as:  TYLENOL  Take 1,000 mg by mouth every 6 (six) hours as needed for moderate pain.     diphenhydramine-acetaminophen 25-500 MG Tabs  Commonly known as:  TYLENOL PM  Take 2 tablets by mouth at bedtime as needed.     folic acid 1 MG tablet  Commonly known as:  FOLVITE  Take 1 tablet (1 mg total) by mouth daily.     ibuprofen 600 MG  tablet  Commonly known as:  ADVIL,MOTRIN  Take 1 tablet (600 mg total) by mouth 3 (three) times daily.     methocarbamol 500 MG tablet  Commonly known as:  ROBAXIN  Take 1 tablet (500 mg total) by mouth every 6 (six) hours as needed for muscle spasms.     metoprolol succinate 50 MG 24 hr tablet  Commonly known as:  TOPROL-XL  Take 50 mg by mouth daily after supper.     morphine 15 MG tablet  Commonly known as:  MSIR  Take 1 tablet (15 mg total) by mouth every 6 (six) hours as needed for severe pain.      oxyCODONE-acetaminophen 5-325 MG per tablet  Commonly known as:  PERCOCET/ROXICET  Take 1-2 tablets by mouth every 4 (four) hours as needed for moderate pain.     polyethylene glycol packet  Commonly known as:  MIRALAX / GLYCOLAX  Take 17 g by mouth daily.     ranitidine 150 MG tablet  Commonly known as:  ZANTAC  Take 150 mg by mouth 2 (two) times daily as needed for heartburn.     vitamin B-12 1000 MCG tablet  Commonly known as:  CYANOCOBALAMIN  Take 1 tablet (1,000 mcg total) by mouth daily.           Follow-up Information    Follow up with Rolm Bookbinder, MD On 11/28/2014.   Specialty:  General Surgery   Why:  arrive no later than 11:15AM for post op check with your surgeon   Contact information:   [place] [address] [phone]       Follow up with Rolm Bookbinder, MD On 11/24/2014.   Specialty:  General Surgery   Why:  arrive by 2:30pm to have your staples removed.    Contact information:   [place] [address] [phone]        The results of significant diagnostics from this hospitalization (including imaging, microbiology, ancillary and laboratory) are listed below for reference.    Significant Diagnostic Studies: Ct Abdomen Pelvis W Contrast  11/13/2014   CLINICAL DATA:  Severe constipation.  EXAM: CT ABDOMEN AND PELVIS WITH CONTRAST  TECHNIQUE: Multidetector CT imaging of the abdomen and pelvis was performed using the standard protocol following bolus administration of intravenous contrast.  CONTRAST:  70mL OMNIPAQUE IOHEXOL 300 MG/ML SOLN, 155mL OMNIPAQUE IOHEXOL 300 MG/ML SOLN  COMPARISON:  09/28/2013  FINDINGS: BODY WALL: Massive low abdominal wall hernia, as discussed below.  LOWER CHEST: Bibasilar atelectasis. There is a Bochdalek's hernia containing fat on the left.  ABDOMEN/PELVIS:  Liver: Stable hemangioma in the posterior segment right liver measuring 55 x 35 mm. The hemangioma has contracted  since 2009 MRI.  Biliary: Numerous gallstones. No biliary obstruction or inflammation.  Pancreas: Unremarkable.  Spleen: Unremarkable.  Adrenals: Unremarkable.  Kidneys and ureters: No hydronephrosis or stone.  Bladder: Unremarkable.  Reproductive: Hysterectomy and presumed oophorectomies.  Bowel: There is dilated small bowel, maximal before it enters a large low abdominal wall hernia. Bowel beyond the herniated segments are relatively decompressed. There is mesenteric congestion in the associated mesenteric fat which is mild but new. No evidence of bowel necrosis or perforation. There is a moderate volume of colonic stool. Normal appendix.  Retroperitoneum: No mass or adenopathy.  Peritoneum: No ascites or pneumoperitoneum.  Vascular: Prominent tortuosity of the infrarenal aorta, with to 90 degree curves in the upper abdomen.  OSSEOUS: L1 corpectomy with strut graft. There has  been progressive subsidence especially into the T12 vertebra where there is lucency around the upper graft. There is doubtful bony fusion across the discectomy level. The inferior portion of the graft continues to the L2 inferior endplate, as prior. No acute fracture. No progressive retropulsion.  IMPRESSION: 1. Partial small bowel obstruction and mesenteric congestion from a large low abdominal wall hernia. 2. L1 corpectomy with strut graft. There is prominent subsidence into the T12 vertebral body with doubtful osseous fusion across the operative level. 3. Cholelithiasis and other chronic findings are noted above.   Electronically Signed   By: Monte Fantasia M.D.   On: 11/13/2014 03:50    Microbiology: Recent Results (from the past 240 hour(s))  Culture, blood (routine x 2)     Status: None (Preliminary result)   Collection Time: 11/13/14  7:10 AM  Result Value Ref Range Status   Specimen Description BLOOD LEFT ANTECUBITAL  Final   Special Requests BOTTLES DRAWN AEROBIC AND ANAEROBIC 10CC  Final   Culture   Final           BLOOD  CULTURE RECEIVED NO GROWTH TO DATE CULTURE WILL BE HELD FOR 5 DAYS BEFORE ISSUING A FINAL NEGATIVE REPORT Performed at [facility]    Report Status PENDING  Incomplete  Culture, blood (routine x 2)     Status: None (Preliminary result)   Collection Time: 11/13/14  7:25 AM  Result Value Ref Range Status   Specimen Description BLOOD LEFT HAND  Final   Special Requests BOTTLES DRAWN AEROBIC AND ANAEROBIC 10CC  Final   Culture   Final           BLOOD CULTURE RECEIVED NO GROWTH TO DATE CULTURE WILL BE HELD FOR 5 DAYS BEFORE ISSUING A FINAL NEGATIVE REPORT Performed at [facility]    Report Status PENDING  Incomplete     Labs: Basic Metabolic Panel:  Recent Labs Lab 11/12/14 2316 11/14/14 0710  NA 137 140  K 4.0 4.1  CL 98 102  CO2 23 26  GLUCOSE 218* 112*  BUN 18 11  CREATININE 0.82 0.62  CALCIUM 10.0 8.5   Liver Function Tests:  Recent Labs Lab 11/12/14 2316 11/14/14 0710  AST 30 15  ALT 62* 22  ALKPHOS 133* 65  BILITOT 1.1 1.5*  PROT 7.3 4.9*  ALBUMIN 5.1 2.9*    Recent Labs Lab 11/12/14 2316  LIPASE 28   No results for input(s): AMMONIA in the last 168 hours. CBC:  Recent Labs Lab 11/12/14 2316 11/14/14 0710 11/15/14 1025 11/16/14 0344  WBC 30.5* 18.2* 11.4* 10.9*  NEUTROABS 27.5*  --  9.0*  --   HGB 16.7* 11.2* 10.0* 10.4*  HCT 47.3* 33.9* 30.2* 31.6*  MCV 85.7 88.1 88.0 88.3  PLT 122* 63* 46* 59*   Cardiac Enzymes: No results for input(s): CKTOTAL, CKMB, CKMBINDEX, TROPONINI in the last 168 hours. BNP: BNP (last 3 results) No results for input(s): BNP in the last 8760 hours.  ProBNP (last 3 results) No results for input(s): PROBNP in the last 8760 hours.  CBG:  Recent Labs Lab 11/13/14 0819 11/14/14 0755 11/15/14 0749  GLUCAP 122* 103* 98    Principal Problem:   SOB (shortness of breath) Active Problems:   HTN (hypertension)   ITP (idiopathic thrombocytopenic purpura)   Tobacco abuse   Leukocytosis    Abdominal pain   Sepsis   GERD (gastroesophageal reflux disease)   Constipation   SBO (small bowel obstruction)   Signed:  Angenette Daily, ANP-BC

## 2014-11-17 NOTE — Progress Notes (Signed)
Pt discharge instructions and prescriptions given, Pt verbalized understanding.  Pt teaching done with JP drain on how to empty and measure drainage and to put drain to suction.  Pt verbalized understanding. Graduate given.

## 2014-11-17 NOTE — Discharge Instructions (Signed)

## 2014-11-17 NOTE — Progress Notes (Signed)
Patient ID: [name], female   DOB: 1951/06/19, 64 y.o.   MRN: [id number]     [address] [place], [place] [address]    Phone: [phone] FAX: [phone]     Subjective: bm yesterday.  Still has moderate pain, but on chronic pain meds for her back.  VSS.  Afebrile.  Ambulating.   Objective:  Vital signs:  Filed Vitals:   11/16/14 1000 11/16/14 1715 11/16/14 2030 11/17/14 0405  BP: 138/81 156/78 139/64 120/67  Pulse: 99 87 88 90  Temp: 98.6 F (37 C) 98.2 F (36.8 C) 99.5 F (37.5 C) 98.3 F (36.8 C)  TempSrc: Oral Oral    Resp: 18 18 17 17   Height:      Weight:      SpO2: 93% 96% 94% 95%    Last BM Date: 11/16/14  Intake/Output   Yesterday:  03/03 0701 - 03/04 0700 In: 720 [P.O.:720] Out: 690 [Urine:650; Drains:40] This shift:    I/O last 3 completed shifts: In: 3423.3 [P.O.:840; I.V.:2583.3] Out: 2435 [Urine:2395; Drains:40]    Physical Exam: General: Pt awake/alert/oriented x4 in no acute distress Chest: cta. No chest wall pain w good excursion CV: s1s2 rrr, no murmurs gallops or rubs.  MS: Normal AROM mjr joints. No obvious deformity Abdomen: Soft. Nondistended. Appropriately tender. Midline incision-dressing removed, staples in place, minimal serous output inferior of the wound.  JP drain with serosanguinous output. No evidence of peritonitis. Ext: SCDs BLE. No mjr edema. No cyanosis Skin: No petechiae / purpura    Problem List:   Principal Problem:   SOB (shortness of breath) Active Problems:   HTN (hypertension)   ITP (idiopathic thrombocytopenic purpura)   Tobacco abuse   Leukocytosis   Abdominal pain   Sepsis   GERD (gastroesophageal reflux disease)   Constipation   SBO (small bowel obstruction)    Results:   Labs: Results for orders placed or performed during the hospital encounter of 11/13/14 (from the past 48 hour(s))  CBC with Differential      Status: Abnormal   Collection Time: 11/15/14 10:25 AM  Result Value Ref Range   WBC 11.4 (H) 4.0 - 10.5 K/uL   RBC 3.43 (L) 3.87 - 5.11 MIL/uL   Hemoglobin 10.0 (L) 12.0 - 15.0 g/dL   HCT 30.2 (L) 36.0 - 46.0 %   MCV 88.0 78.0 - 100.0 fL   MCH 29.2 26.0 - 34.0 pg   MCHC 33.1 30.0 - 36.0 g/dL   RDW 14.3 11.5 - 15.5 %   Platelets 46 (L) 150 - 400 K/uL    Comment: REPEATED TO VERIFY CONSISTENT WITH PREVIOUS RESULT    Neutrophils Relative % 79 (H) 43 - 77 %   Neutro Abs 9.0 (H) 1.7 - 7.7 K/uL   Lymphocytes Relative 10 (L) 12 - 46 %   Lymphs Abs 1.2 0.7 - 4.0 K/uL   Monocytes Relative 7 3 - 12 %   Monocytes Absolute 0.8 0.1 - 1.0 K/uL   Eosinophils Relative 4 0 - 5 %   Eosinophils Absolute 0.5 0.0 - 0.7 K/uL   Basophils Relative 0 0 - 1 %   Basophils Absolute 0.0 0.0 - 0.1 K/uL  CBC     Status: Abnormal   Collection Time: 11/16/14  3:44 AM  Result Value Ref Range   WBC 10.9 (H) 4.0 - 10.5 K/uL   RBC 3.58 (L) 3.87 - 5.11 MIL/uL  Hemoglobin 10.4 (L) 12.0 - 15.0 g/dL   HCT 31.6 (L) 36.0 - 46.0 %   MCV 88.3 78.0 - 100.0 fL   MCH 29.1 26.0 - 34.0 pg   MCHC 32.9 30.0 - 36.0 g/dL   RDW 13.8 11.5 - 15.5 %   Platelets 59 (L) 150 - 400 K/uL    Comment: SPECIMEN CHECKED FOR CLOTS REPEATED TO VERIFY PLATELET COUNT CONFIRMED BY SMEAR     Imaging / Studies: No results found.  Medications / Allergies:  Scheduled Meds: . antiseptic oral rinse  7 mL Mouth Rinse BID  . ibuprofen  600 mg Oral TID  . metoprolol succinate  50 mg Oral QPC supper  . nicotine  21 mg Transdermal Daily  . nystatin   Topical TID  . pantoprazole  40 mg Oral QHS  . polyethylene glycol  17 g Oral Daily  . sodium chloride  3 mL Intravenous Q12H   Continuous Infusions:  PRN Meds:.acetaminophen (TYLENOL) oral liquid 160 mg/5 mL, acetaminophen, diphenhydrAMINE **AND** acetaminophen, HYDROmorphone (DILAUDID) injection, methocarbamol, morphine, ondansetron (ZOFRAN) IV,  oxyCODONE-acetaminophen  Antibiotics: Anti-infectives    Start     Dose/Rate Route Frequency Ordered Stop   11/13/14 1600  fluconazole (DIFLUCAN) IVPB 150 mg     150 mg 75 mL/hr over 60 Minutes Intravenous  Once 11/13/14 1400 11/13/14 1750   11/13/14 1600  ceFAZolin (ANCEF) IVPB 2 g/50 mL premix    Comments:  Pharmacy may adjust dosing strength, interval, or rate of medication as needed for optimal therapy for the patient  Send with patient on call to the OR.  Anesthesia to complete antibiotic administration <49min prior to incision per [facility].   2 g 100 mL/hr over 30 Minutes Intravenous 3 times per day 11/13/14 1400 11/14/14 1359   11/13/14 0845  ceFAZolin (ANCEF) IVPB 2 g/50 mL premix    Comments:  Pharmacy may adjust dosing strength, interval, or rate of medication as needed for optimal therapy for the patient  Send with patient on call to the OR.  Anesthesia to complete antibiotic administration <14min prior to incision per [facility].   2 g 100 mL/hr over 30 Minutes Intravenous On call to O.R. 11/13/14 0841 11/13/14 1000      Assessment/Plan Incisional hernia with obstruction POD#4 exploratory laparotomy with reduction of hernia primary repair of incarcerated incisional hernia with placement of biologic mesh---Dr. Donne Hazel  -tolerating solids, BM yesterday, voiding, ambulating, cleared by PT -miralax -mobilize -IS -c/w morphine SR, percocet, change dilaudid for breakthrough pain only, add ibuprofen short term.  Still using IV pain meds -plan to leave her drain in, teach how to record and empty. Once <30cc in 24h, nurse visit to have the drain removed VTE prophylaxis -SCD, lovenox stopped due to low platelets ITP -appreciate hem/onc assistance -PLT stable Dispo--pain control, will check on her later today and hopefully discharge home.   Erby Pian, [facility] Surgery Pager [phone]) For consults and floor pages call  [phone](7A-4:30P)  11/17/2014 8:44 AM

## 2014-11-17 NOTE — Progress Notes (Signed)
Physical Therapy Treatment Patient Details Name: [name] MRN: [id number] DOB: 08-26-1951 Today's Date: 11/17/2014    History of Present Illness Pt is a 64 y.o. female with past medical history of hypertension, GERD, ITP, hysterectomy, C-section x 2, chronic leukocytosis, who presents with abdominal pain. Patient repots that her abdominal pain started one week ago. It has been progressively getting worse. Her abdominal pain is diffuse, constant, 10 out of 10 in severity. She also has nausea and vomiting. She vomited 4-5 times today. It is associated with severe constipation. Pt states that she is taking a lot of percocet post back surgery. Pt states she has tried a laxative and miralax with no relief. She also reports having severe acid reflux symptoms and heartburn. In ED, patient was found to have partial small bowel obstruction and mesenteric congestion from a large low abdominal wall hernia on CT abdomen/pelvis. Patient is now s/p hernial repair on 11/13/14.    PT Comments    Pt progressing towards physical therapy goals. Pt continues to have difficulty with bed mobility (supine<>sit) and LB tasks such as donning socks. Attempted further education/practice, however although pleasant, pt was not open to teaching. She reports that she will be sleeping in her recliner at home and therefore will not need to get in and out of her bed. Also states that once she gets home she will be able to don her own shoes and socks (even though not able to attempt during session), and is not interested in OT or further training from PT. Will continue to follow until d/c.    Follow Up Recommendations  No PT follow up     Equipment Recommendations  None recommended by PT    Recommendations for Other Services       Precautions / Restrictions Precautions Precautions: Fall Precaution Comments: JP drain Restrictions Weight Bearing Restrictions: No    Mobility  Bed Mobility Overal bed mobility: Needs  Assistance Bed Mobility: Rolling;Sidelying to Sit;Sit to Sidelying Rolling: Supervision Sidelying to sit: Min guard     Sit to sidelying: Min assist General bed mobility comments: Pt with difficulty transitioning sit>sidelying. Assist for LE elevatin back into bed required.   Transfers Overall transfer level: Needs assistance Equipment used: Rolling walker (2 wheeled) Transfers: Sit to/from Stand Sit to Stand: Min guard         General transfer comment: Pt was able to power-up to full standing position without assistance. Increased time required as pt moving very slow and guarded due to pain.   Ambulation/Gait Ambulation/Gait assistance: Supervision Ambulation Distance (Feet): 360 Feet Assistive device: Rolling walker (2 wheeled) Gait Pattern/deviations: Step-through pattern Gait velocity: Decreased Gait velocity interpretation: Below normal speed for age/gender General Gait Details: No standing rest breaks required. Slow and steady gait.    Stairs Stairs: Yes Stairs assistance: Min guard Stair Management: Two rails;Alternating pattern Number of Stairs: 5 General stair comments: VC's for sequencing and safety awareness.   Wheelchair Mobility    Modified Rankin (Stroke Patients Only)       Balance Overall balance assessment: No apparent balance deficits (not formally assessed)                                  Cognition Arousal/Alertness: Awake/alert Behavior During Therapy: WFL for tasks assessed/performed Overall Cognitive Status: Within Functional Limits for tasks assessed  Exercises      General Comments        Pertinent Vitals/Pain Pain Assessment: 0-10 Pain Score: 5  Pain Location: Abdomen and back Pain Intervention(s): Limited activity within patient's tolerance;Monitored during session;Premedicated before session    Home Living                      Prior Function            PT Goals  (current goals can now be found in the care plan section) Acute Rehab PT Goals Patient Stated Goal: Decrease her pain PT Goal Formulation: With patient Time For Goal Achievement: 11/21/14 Potential to Achieve Goals: Good Progress towards PT goals: Progressing toward goals    Frequency  Min 3X/week    PT Plan Current plan remains appropriate    Co-evaluation             End of Session Equipment Utilized During Treatment: Gait belt Activity Tolerance: Patient tolerated treatment well Patient left: with call bell/phone within reach;in bed     Time: 4536-4680 PT Time Calculation (min) (ACUTE ONLY): 31 min  Charges:  $Gait Training: 8-22 mins $Therapeutic Activity: 8-22 mins                    G Codes:      Rolinda Roan 03-Dec-2014, 10:32 AM   Rolinda Roan, PT, DPT Acute Rehabilitation Services Pager: [phone]

## 2014-11-17 NOTE — Telephone Encounter (Signed)
no vm....mailed pt appt sched and letter for April

## 2014-11-19 LAB — CULTURE, BLOOD (ROUTINE X 2)
Culture: NO GROWTH
Culture: NO GROWTH

## 2014-11-23 ENCOUNTER — Encounter (HOSPITAL_COMMUNITY): Payer: Self-pay | Admitting: General Surgery

## 2014-11-23 NOTE — OR Nursing (Signed)
Late entry, delay code documentation.

## 2014-12-18 ENCOUNTER — Ambulatory Visit: Payer: BC Managed Care – PPO | Admitting: Hematology and Oncology

## 2014-12-18 ENCOUNTER — Telehealth: Payer: Self-pay | Admitting: Hematology and Oncology

## 2014-12-18 ENCOUNTER — Other Ambulatory Visit: Payer: BC Managed Care – PPO

## 2014-12-18 NOTE — Telephone Encounter (Signed)
s.w. pt and r/s appt due to pt sick...r/s to next available...pt ok and aware

## 2015-01-19 ENCOUNTER — Other Ambulatory Visit: Payer: BC Managed Care – PPO

## 2015-01-19 ENCOUNTER — Ambulatory Visit: Payer: BC Managed Care – PPO | Admitting: Hematology and Oncology

## 2015-03-27 IMAGING — CR DG CHEST 2V
2 series · 2 of 2 positions shown · non-contrast
Comparison: 05/22/2013.

CLINICAL DATA: Preop radiograph for L1 corpectomy.

EXAM:
CHEST  2 VIEW

[w chest pa]
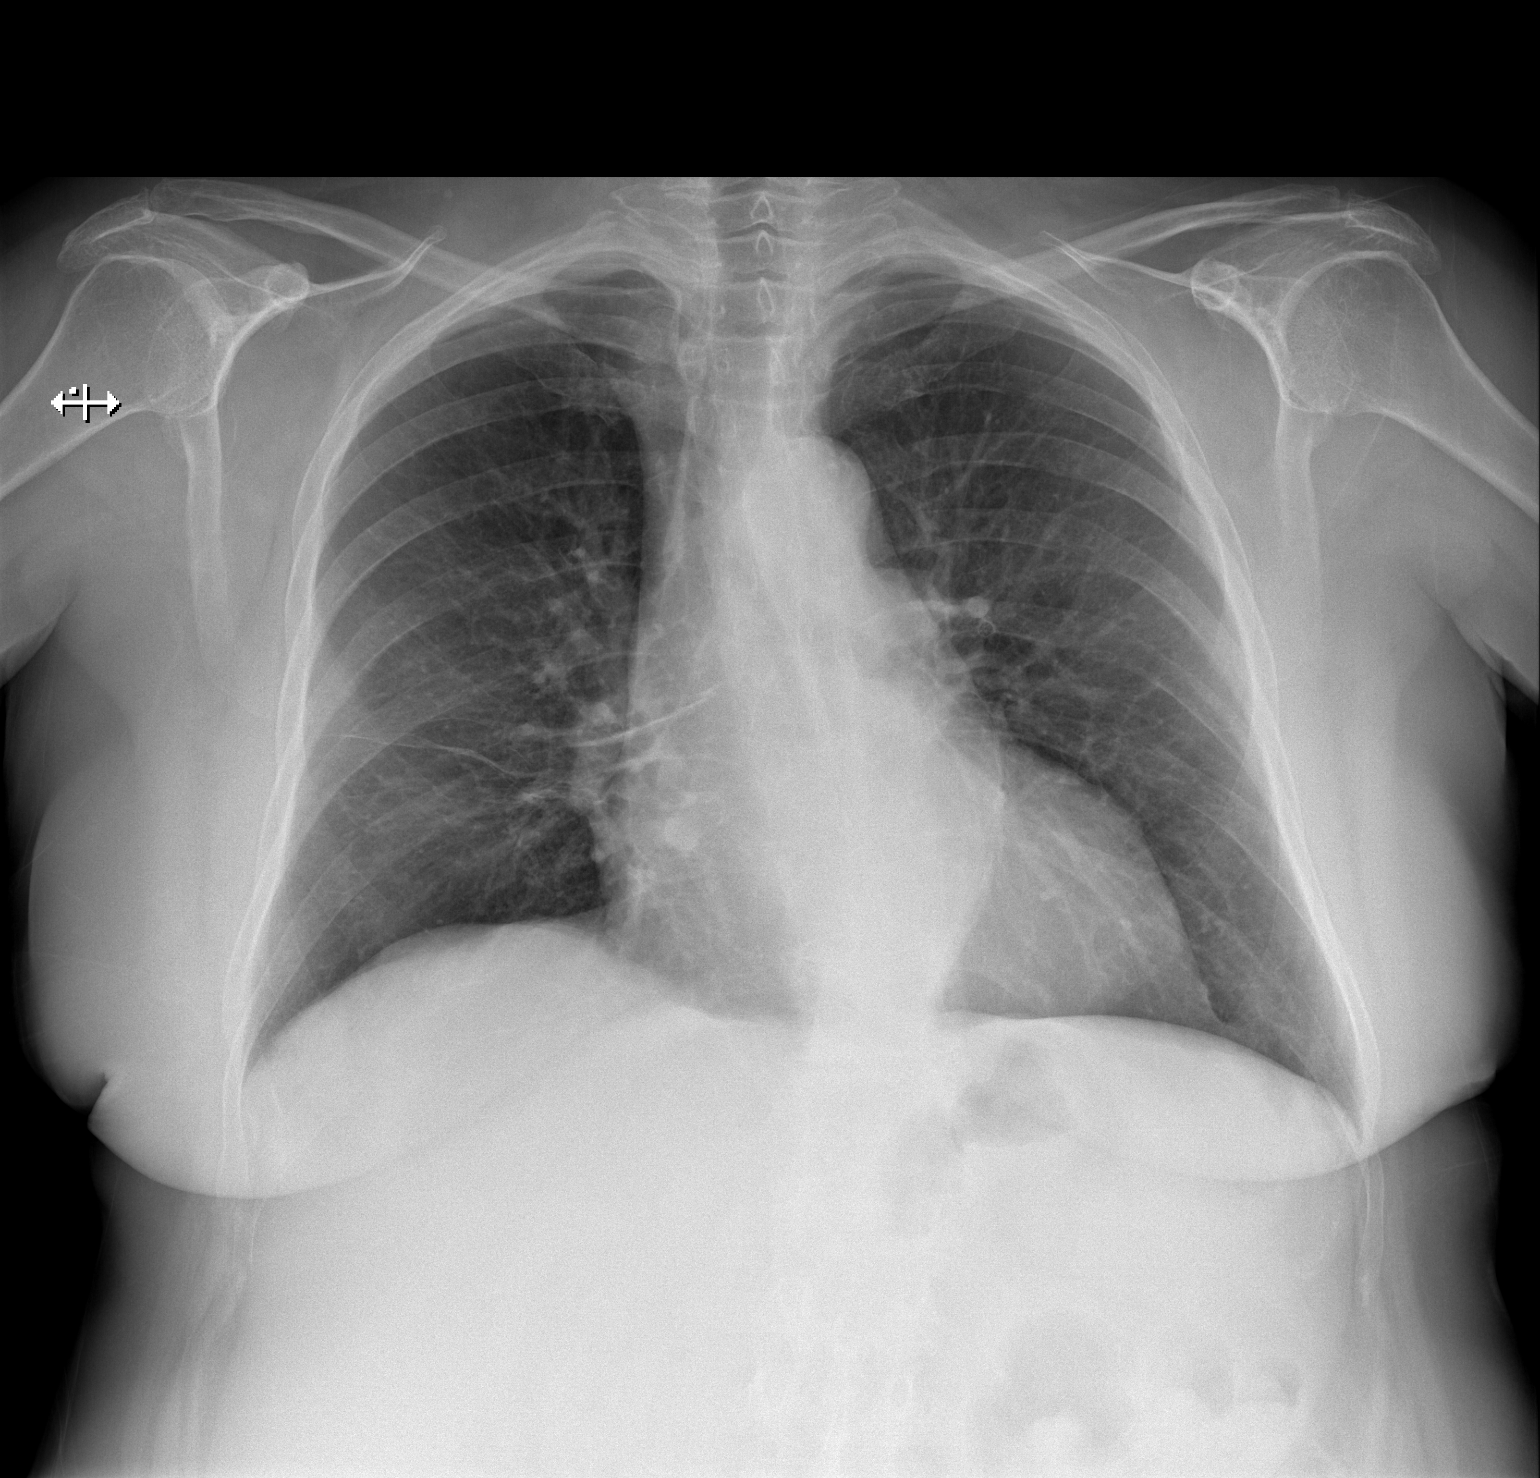

[w chest lat]
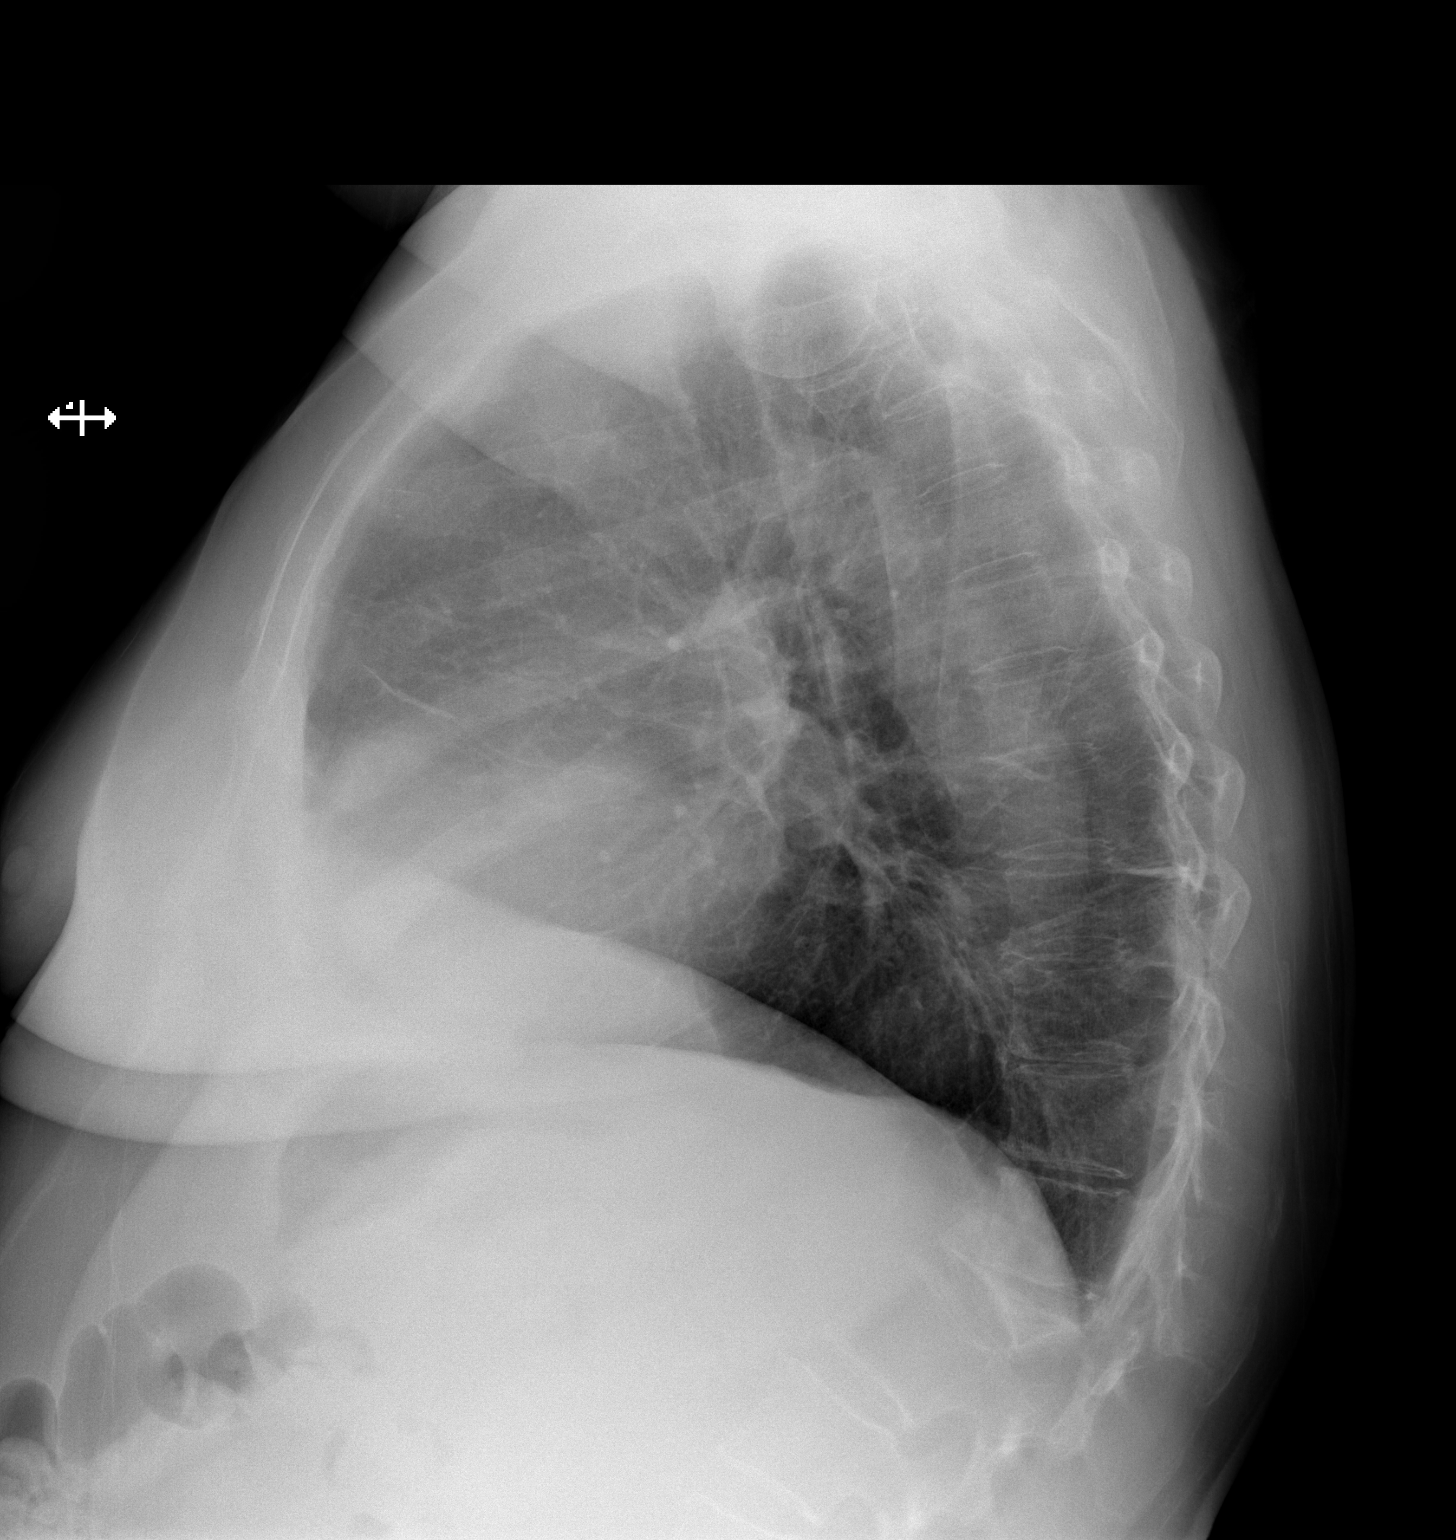

[2 of 2 positions shown; findings below may reference images not displayed]

FINDINGS: Mild cardiac enlargement. No pleural effusion or edema identified.
No airspace consolidation. Scarring noted within the right midlung.
L1 vertebra plana deformity is identified.
IMPRESSION: 1. No acute cardiopulmonary abnormalities.
2. L1 vertebra plana

## 2017-06-09 ENCOUNTER — Inpatient Hospital Stay (HOSPITAL_COMMUNITY)
Admission: EM | Admit: 2017-06-09 | Discharge: 2017-06-20 | DRG: 291 | Disposition: A | Discharge status: Home Health | Payer: BC Managed Care – PPO | Attending: Internal Medicine | Admitting: Internal Medicine

## 2017-06-09 ENCOUNTER — Emergency Department (HOSPITAL_COMMUNITY): Payer: BC Managed Care – PPO

## 2017-06-09 ENCOUNTER — Encounter (HOSPITAL_COMMUNITY): Payer: Self-pay

## 2017-06-09 DIAGNOSIS — Z720 Tobacco use: Secondary | ICD-10-CM | POA: Diagnosis not present

## 2017-06-09 DIAGNOSIS — I130 Hypertensive heart and chronic kidney disease with heart failure and stage 1 through stage 4 chronic kidney disease, or unspecified chronic kidney disease: Secondary | ICD-10-CM | POA: Diagnosis present

## 2017-06-09 DIAGNOSIS — I5043 Acute on chronic combined systolic (congestive) and diastolic (congestive) heart failure: Secondary | ICD-10-CM | POA: Diagnosis present

## 2017-06-09 DIAGNOSIS — M810 Age-related osteoporosis without current pathological fracture: Secondary | ICD-10-CM | POA: Diagnosis present

## 2017-06-09 DIAGNOSIS — Z79899 Other long term (current) drug therapy: Secondary | ICD-10-CM

## 2017-06-09 DIAGNOSIS — K219 Gastro-esophageal reflux disease without esophagitis: Secondary | ICD-10-CM | POA: Diagnosis present

## 2017-06-09 DIAGNOSIS — I422 Other hypertrophic cardiomyopathy: Secondary | ICD-10-CM | POA: Diagnosis present

## 2017-06-09 DIAGNOSIS — G8929 Other chronic pain: Secondary | ICD-10-CM | POA: Diagnosis present

## 2017-06-09 DIAGNOSIS — S99921A Unspecified injury of right foot, initial encounter: Secondary | ICD-10-CM | POA: Diagnosis not present

## 2017-06-09 DIAGNOSIS — I5032 Chronic diastolic (congestive) heart failure: Secondary | ICD-10-CM

## 2017-06-09 DIAGNOSIS — I872 Venous insufficiency (chronic) (peripheral): Secondary | ICD-10-CM | POA: Diagnosis present

## 2017-06-09 DIAGNOSIS — I10 Essential (primary) hypertension: Secondary | ICD-10-CM | POA: Diagnosis not present

## 2017-06-09 DIAGNOSIS — Z6841 Body Mass Index (BMI) 40.0 and over, adult: Secondary | ICD-10-CM

## 2017-06-09 DIAGNOSIS — I351 Nonrheumatic aortic (valve) insufficiency: Secondary | ICD-10-CM | POA: Diagnosis present

## 2017-06-09 DIAGNOSIS — Y92239 Unspecified place in hospital as the place of occurrence of the external cause: Secondary | ICD-10-CM | POA: Diagnosis not present

## 2017-06-09 DIAGNOSIS — D693 Immune thrombocytopenic purpura: Secondary | ICD-10-CM | POA: Diagnosis present

## 2017-06-09 DIAGNOSIS — L03115 Cellulitis of right lower limb: Secondary | ICD-10-CM | POA: Diagnosis present

## 2017-06-09 DIAGNOSIS — I361 Nonrheumatic tricuspid (valve) insufficiency: Secondary | ICD-10-CM | POA: Diagnosis not present

## 2017-06-09 DIAGNOSIS — J9601 Acute respiratory failure with hypoxia: Secondary | ICD-10-CM | POA: Diagnosis present

## 2017-06-09 DIAGNOSIS — M545 Low back pain: Secondary | ICD-10-CM | POA: Diagnosis present

## 2017-06-09 DIAGNOSIS — R609 Edema, unspecified: Secondary | ICD-10-CM | POA: Diagnosis not present

## 2017-06-09 DIAGNOSIS — I878 Other specified disorders of veins: Secondary | ICD-10-CM | POA: Diagnosis present

## 2017-06-09 DIAGNOSIS — L03116 Cellulitis of left lower limb: Secondary | ICD-10-CM | POA: Diagnosis present

## 2017-06-09 DIAGNOSIS — I5031 Acute diastolic (congestive) heart failure: Secondary | ICD-10-CM | POA: Diagnosis not present

## 2017-06-09 DIAGNOSIS — I509 Heart failure, unspecified: Secondary | ICD-10-CM

## 2017-06-09 DIAGNOSIS — X58XXXA Exposure to other specified factors, initial encounter: Secondary | ICD-10-CM | POA: Diagnosis not present

## 2017-06-09 DIAGNOSIS — Z9114 Patient's other noncompliance with medication regimen: Secondary | ICD-10-CM | POA: Diagnosis not present

## 2017-06-09 DIAGNOSIS — M1991 Primary osteoarthritis, unspecified site: Secondary | ICD-10-CM | POA: Diagnosis present

## 2017-06-09 DIAGNOSIS — Z87891 Personal history of nicotine dependence: Secondary | ICD-10-CM | POA: Diagnosis not present

## 2017-06-09 DIAGNOSIS — I5033 Acute on chronic diastolic (congestive) heart failure: Secondary | ICD-10-CM | POA: Diagnosis not present

## 2017-06-09 DIAGNOSIS — K5900 Constipation, unspecified: Secondary | ICD-10-CM | POA: Diagnosis not present

## 2017-06-09 DIAGNOSIS — Z9119 Patient's noncompliance with other medical treatment and regimen: Secondary | ICD-10-CM

## 2017-06-09 DIAGNOSIS — N189 Chronic kidney disease, unspecified: Secondary | ICD-10-CM | POA: Diagnosis present

## 2017-06-09 DIAGNOSIS — E662 Morbid (severe) obesity with alveolar hypoventilation: Secondary | ICD-10-CM | POA: Diagnosis present

## 2017-06-09 LAB — COMPREHENSIVE METABOLIC PANEL
ALT: 22 U/L (ref 14–54)
AST: 16 U/L (ref 15–41)
Albumin: 3.8 g/dL (ref 3.5–5.0)
Alkaline Phosphatase: 62 U/L (ref 38–126)
Anion gap: 6 (ref 5–15)
BUN: 11 mg/dL (ref 6–20)
CO2: 28 mmol/L (ref 22–32)
Calcium: 8.2 mg/dL — ABNORMAL LOW (ref 8.9–10.3)
Chloride: 103 mmol/L (ref 101–111)
Creatinine, Ser: 0.72 mg/dL (ref 0.44–1.00)
GFR calc Af Amer: >60 mL/min (ref >60)
GFR calc non Af Amer: >60 mL/min (ref >60)
Glucose, Bld: 98 mg/dL (ref 65–99)
Potassium: 4 mmol/L (ref 3.5–5.1)
Sodium: 137 mmol/L (ref 135–145)
Total Bilirubin: 0.8 mg/dL (ref 0.3–1.2)
Total Protein: 5.8 g/dL — ABNORMAL LOW (ref 6.5–8.1)

## 2017-06-09 LAB — BRAIN NATRIURETIC PEPTIDE: B Natriuretic Peptide: 477.7 pg/mL — ABNORMAL HIGH (ref 0.0–100.0)

## 2017-06-09 LAB — URINALYSIS, ROUTINE W REFLEX MICROSCOPIC
Bacteria, UA: NONE SEEN
Bilirubin Urine: NEGATIVE
Glucose, UA: NEGATIVE mg/dL
Ketones, ur: NEGATIVE mg/dL
Leukocytes, UA: NEGATIVE
Nitrite: NEGATIVE
Protein, ur: NEGATIVE mg/dL
Specific Gravity, Urine: 1.005 (ref 1.005–1.030)
Squamous Epithelial / LPF: NONE SEEN
WBC, UA: NONE SEEN WBC/hpf (ref 0–5)
pH: 6 (ref 5.0–8.0)

## 2017-06-09 LAB — CBC WITH DIFFERENTIAL/PLATELET
Basophils Absolute: 0 10*3/uL (ref 0.0–0.1)
Basophils Relative: 0 %
Eosinophils Absolute: 0.2 10*3/uL (ref 0.0–0.7)
Eosinophils Relative: 2 %
HCT: 49.3 % — ABNORMAL HIGH (ref 36.0–46.0)
Hemoglobin: 14.9 g/dL (ref 12.0–15.0)
Lymphocytes Relative: 13 %
Lymphs Abs: 1.4 10*3/uL (ref 0.7–4.0)
MCH: 27.1 pg (ref 26.0–34.0)
MCHC: 30.2 g/dL (ref 30.0–36.0)
MCV: 89.6 fL (ref 78.0–100.0)
Monocytes Absolute: 0.7 10*3/uL (ref 0.1–1.0)
Monocytes Relative: 7 %
Neutro Abs: 8.4 10*3/uL — ABNORMAL HIGH (ref 1.7–7.7)
Neutrophils Relative %: 78 %
Platelets: 70 10*3/uL — ABNORMAL LOW (ref 150–400)
RBC: 5.5 MIL/uL — ABNORMAL HIGH (ref 3.87–5.11)
RDW: 15 % (ref 11.5–15.5)
WBC: 10.7 10*3/uL — ABNORMAL HIGH (ref 4.0–10.5)

## 2017-06-09 LAB — TROPONIN I: Troponin I: <0.03 ng/mL (ref <0.03)

## 2017-06-09 MED ORDER — FUROSEMIDE 10 MG/ML IJ SOLN
40.0000 mg | Freq: Once | INTRAMUSCULAR | Status: AC
  Administered 2017-06-09: 40 mg via INTRAVENOUS
  Filled 2017-06-09: qty 4

## 2017-06-09 MED ORDER — LISINOPRIL 5 MG PO TABS
5.0000 mg | ORAL_TABLET | Freq: Every day | ORAL | Status: DC
  Administered 2017-06-10 – 2017-06-15 (×6): 5 mg via ORAL
  Filled 2017-06-09 (×7): qty 1

## 2017-06-09 MED ORDER — OXYCODONE-ACETAMINOPHEN 5-325 MG PO TABS
1.0000 | ORAL_TABLET | Freq: Three times a day (TID) | ORAL | Status: DC | PRN
  Administered 2017-06-10 – 2017-06-20 (×29): 1 via ORAL
  Filled 2017-06-09 (×29): qty 1

## 2017-06-09 MED ORDER — OXYCODONE-ACETAMINOPHEN 10-325 MG PO TABS: 1.0000 | ORAL_TABLET | Freq: Three times a day (TID) | ORAL | Status: DC | PRN

## 2017-06-09 MED ORDER — OXYCODONE HCL 5 MG PO TABS
5.0000 mg | ORAL_TABLET | Freq: Three times a day (TID) | ORAL | Status: DC | PRN
  Administered 2017-06-10 – 2017-06-20 (×27): 5 mg via ORAL
  Filled 2017-06-09 (×28): qty 1

## 2017-06-09 MED ORDER — POLYETHYLENE GLYCOL 3350 17 G PO PACK
17.0000 g | PACK | Freq: Every day | ORAL | Status: DC | PRN
  Administered 2017-06-14 – 2017-06-20 (×4): 17 g via ORAL
  Filled 2017-06-09 (×4): qty 1

## 2017-06-09 MED ORDER — ACETAMINOPHEN 650 MG RE SUPP: 650.0000 mg | Freq: Four times a day (QID) | RECTAL | Status: DC | PRN

## 2017-06-09 MED ORDER — ACETAMINOPHEN 325 MG PO TABS
650.0000 mg | ORAL_TABLET | Freq: Four times a day (QID) | ORAL | Status: DC | PRN
  Administered 2017-06-10 – 2017-06-15 (×2): 650 mg via ORAL
  Filled 2017-06-09 (×2): qty 2

## 2017-06-09 MED ORDER — FUROSEMIDE 10 MG/ML IJ SOLN
80.0000 mg | Freq: Two times a day (BID) | INTRAMUSCULAR | Status: DC
  Administered 2017-06-10 – 2017-06-16 (×13): 80 mg via INTRAVENOUS
  Filled 2017-06-09 (×13): qty 8

## 2017-06-09 NOTE — H&P (Signed)
History and Physical    [name] FYB:[id number] DOB: 1950-09-28 DOA: 06/09/2017  PCP: [name], NP  Patient coming from: home.  Chief Complaint: shortness of breath.  HPI: [name] is a 65 y.o. female with history of hypertension chronic low back pain diopathic thrombocytopenia many purpura has not been to a physician for long time presents to the ER with complaints of worsening shortness of breath and increasing lower extremity edema. Patient has been having these symptoms for last many months but since symptoms have acutely worsened with fluid draining out of the legs patient decided to come to the ER. Denies any fever chills chest pain or productive cough.   ED Course: in the ER on exam patient has significant edema up to the thighs bilaterally.JVD is difficult to assess. Mild crepitations bilaterally. Chest x-ray shows cardiomegaly. EKG shows normal sinus rhythm with BNP of 477. Patient was given Lasix 40 mg IV in the ER and I have added another dose of 40 mg IV. Patient admitted for CHF.  Review of Systems: As per HPI, rest all negative.   Past Medical History:  Diagnosis Date  . Arthritis    lumbar burst fx, osteoporosis  . Back pain 09/29/2013  . Blood dyscrasia   . Chest pain    denies  . Chronic kidney disease    passed stones spontaneously- 40 yrs. ago   . Depression    states her current situation with activity limitations, she has a low spirit at times.   Marland Kitchen Dysrhythmia    occ tachycardia  . Fracture of vertebra, compression ([place])   . GERD (gastroesophageal reflux disease)    occ  . History of kidney stones   . HTN (hypertension)   . ITP (idiopathic thrombocytopenic purpura) 08/29/2013  . Leukocytosis, unspecified 09/05/2013  . Muscle cramp 11/16/2013  . Tachycardia    occ upon exersion, and when put on bp med    Past Surgical History:  Procedure Laterality Date  . ABDOMINAL HYSTERECTOMY    . ANTERIOR LAT LUMBAR FUSION N/A 03/28/2014   Procedure: Anterolateral decompression of L1 fracture with posterior segmental stabilization;  Surgeon: Kristeen Miss, MD;  Location: [facility];  Service: Neurosurgery;  Laterality: N/A;  Anterolateral decompression of Lumbar One fracture with posterior segmental stabilization  . CESAREAN SECTION     x2  . CHEST TUBE INSERTION Left 03/28/2014   Procedure: CHEST TUBE INSERTION;  Surgeon: Ivin Poot, MD;  Location: [facility];  Service: Thoracic;  Laterality: Left;  . INCISIONAL HERNIA REPAIR N/A 11/13/2014   Procedure: PRIMARY REPAIR INCARCERATED INCISIONAL HERNIA WITH PLACEMENT BIOLOGIC MESH;  Surgeon: Rolm Bookbinder, MD;  Location: [facility];  Service: General;  Laterality: N/A;  . LAPAROTOMY Right 09   ovarian cyst  . TOTAL ABDOMINAL HYSTERECTOMY W/ BILATERAL SALPINGOOPHORECTOMY  97     reports that she quit smoking about 2 years ago. Her smoking use included Cigarettes. She has a 11.50 pack-year smoking history. She has never used smokeless tobacco. She reports that she does not drink alcohol or use drugs.  No Known Allergies  Family History  Problem Relation Age of Onset  . Hypertension Unknown   . Heart attack Unknown   . Leukemia Paternal Uncle        leukemia    Prior to Admission medications   Medication Sig Start Date End Date Taking? Authorizing Provider  acetaminophen (TYLENOL) 500 MG tablet Take 1,000 mg by mouth every 6 (six) hours as needed  for moderate pain.   Yes [provider]  diphenhydramine-acetaminophen (TYLENOL PM) 25-500 MG TABS Take 2 tablets by mouth at bedtime as needed.   Yes [provider]  oxyCODONE-acetaminophen (PERCOCET) 10-325 MG tablet Take 1 tablet by mouth every 8 (eight) hours as needed. 05/29/17  Yes [provider]  polyethylene glycol (MIRALAX / GLYCOLAX) packet Take 17 g by mouth daily. Patient taking differently: Take 17 g by mouth daily as needed.  11/17/14  Yes Riebock, Emina, NP  folic acid (FOLVITE) 1 MG  tablet Take 1 tablet (1 mg total) by mouth daily. Patient not taking: Reported on 11/13/2014 09/14/13   Heath Lark, MD  methocarbamol (ROBAXIN) 500 MG tablet Take 1 tablet (500 mg total) by mouth every 6 (six) hours as needed for muscle spasms. Patient not taking: Reported on 06/09/2017 04/04/14   Kristeen Miss, MD  morphine (MSIR) 15 MG tablet Take 1 tablet (15 mg total) by mouth every 6 (six) hours as needed for severe pain. Patient not taking: Reported on 11/13/2014 03/10/14   Heath Lark, MD  oxyCODONE-acetaminophen (PERCOCET/ROXICET) 5-325 MG per tablet Take 1-2 tablets by mouth every 4 (four) hours as needed for moderate pain. Patient not taking: Reported on 06/09/2017 11/17/14   Erby Pian, NP  vitamin B-12 (CYANOCOBALAMIN) 1000 MCG tablet Take 1 tablet (1,000 mcg total) by mouth daily. Patient not taking: Reported on 11/13/2014 09/14/13   Heath Lark, MD    Physical Exam: Vitals:   06/09/17 2230 06/09/17 2243 06/09/17 2300 06/09/17 2306  BP: (!) 177/67 (!) 170/80 (!) 157/72   Pulse: 91 92 90   Resp: 19 (!) 21 (!) 23   Temp:      TempSrc:      SpO2: 95% 94% 91% 95%  Weight:      Height:          Constitutional: moderately built and nourished. Vitals:   06/09/17 2230 06/09/17 2243 06/09/17 2300 06/09/17 2306  BP: (!) 177/67 (!) 170/80 (!) 157/72   Pulse: 91 92 90   Resp: 19 (!) 21 (!) 23   Temp:      TempSrc:      SpO2: 95% 94% 91% 95%  Weight:      Height:       Eyes: anicteric no pallor. ENMT: no discharge from the ears eyes nose or mouth. Neck: jVD difficult to assess. No mass felt. Respiratory: no rhonchi mild crepitations. Cardiovascular: S1-S2 heard no murmurs appreciated. Abdomen: soft nontender bowel sounds present. No guarding or rigidity. Musculoskeletal: bilateral lower extremity edema extending up to the thighs. Skin: bilateral lower extremity skin changes mildly erythematous but no definite signs of infection. Neurologic:alert awake oriented to time place  and person. Moves all extremities. Psychiatric: appears normal. Normal affect.   Labs on Admission: I have personally reviewed following labs and imaging studies  CBC:  Recent Labs Lab 06/09/17 1724  WBC 10.7*  NEUTROABS 8.4*  HGB 14.9  HCT 49.3*  MCV 89.6  PLT 70*   Basic Metabolic Panel:  Recent Labs Lab 06/09/17 1724  NA 137  K 4.0  CL 103  CO2 28  GLUCOSE 98  BUN 11  CREATININE 0.72  CALCIUM 8.2*   GFR: Estimated Creatinine Clearance: 63.7 mL/min (by C-G formula based on SCr of 0.72 mg/dL). Liver Function Tests:  Recent Labs Lab 06/09/17 1724  AST 16  ALT 22  ALKPHOS 62  BILITOT 0.8  PROT 5.8*  ALBUMIN 3.8   No results for input(s): LIPASE,  AMYLASE in the last 168 hours. No results for input(s): AMMONIA in the last 168 hours. Coagulation Profile: No results for input(s): INR, PROTIME in the last 168 hours. Cardiac Enzymes:  Recent Labs Lab 06/09/17 1724  TROPONINI <0.03   BNP (last 3 results) No results for input(s): PROBNP in the last 8760 hours. HbA1C: No results for input(s): HGBA1C in the last 72 hours. CBG: No results for input(s): GLUCAP in the last 168 hours. Lipid Profile: No results for input(s): CHOL, HDL, LDLCALC, TRIG, CHOLHDL, LDLDIRECT in the last 72 hours. Thyroid Function Tests: No results for input(s): TSH, T4TOTAL, FREET4, T3FREE, THYROIDAB in the last 72 hours. Anemia Panel: No results for input(s): VITAMINB12, FOLATE, FERRITIN, TIBC, IRON, RETICCTPCT in the last 72 hours. Urine analysis:    Component Value Date/Time   COLORURINE STRAW (A) 06/09/2017 2044   APPEARANCEUR CLEAR 06/09/2017 2044   LABSPEC 1.005 06/09/2017 2044   PHURINE 6.0 06/09/2017 2044   GLUCOSEU NEGATIVE 06/09/2017 2044   HGBUR SMALL (A) 06/09/2017 2044   BILIRUBINUR NEGATIVE 06/09/2017 2044   KETONESUR NEGATIVE 06/09/2017 2044   PROTEINUR NEGATIVE 06/09/2017 2044   UROBILINOGEN 0.2 11/13/2014 0233   NITRITE NEGATIVE 06/09/2017 2044    LEUKOCYTESUR NEGATIVE 06/09/2017 2044   Sepsis Labs: @LABRCNTIP (procalcitonin:4,lacticidven:4) )No results found for this or any previous visit (from the past 240 hour(s)).   Radiological Exams on Admission: Dg Chest 2 View  Result Date: 06/09/2017 CLINICAL DATA:  Redness of breath, BILATERAL leg swelling for 1 month, history hypertension, former smoker EXAM: CHEST  2 VIEW COMPARISON:  03/31/2014 FINDINGS: Enlargement of cardiac silhouette. Mediastinal contours and pulmonary vascularity normal. Mild subsegmental atelectasis at lower lungs. No definite infiltrate or edema. No pleural effusion or pneumothorax. Bones diffusely demineralized. IMPRESSION: Enlargement of cardiac silhouette with bibasilar atelectasis. Electronically Signed   By: Lavonia Dana M.D.   On: 06/09/2017 18:05    EKG: Independently reviewed. Normal sinus rhythm with VPCs.  Assessment/Plan Principal Problem:   Acute congestive heart failure (HCC) Active Problems:   HTN (hypertension)   Idiopathic thrombocytopenic purpura (HCC)   Tobacco abuse   CHF (congestive heart failure) ([place])    1. Acute congestive heart failure unknown EF - patient's symptoms are consistent with new onset CHF. Patient has received a total of 80 mg IV Lasix in the ER. Reviewing her chart patient has at least put on 50 pound from her previous known weight.I have continued patient on Lasix 80 mg IV every 12. Check 2-D echo cardiac markers. We'll also check Dopplers of the lower extremity. 2. Hypertension - patient used to be on beta blockers but patient has not been taking it. For now patient has been placed on low-dose lisinopril. Closely follow blood pressure trends and creatinine. 3. Idiopathic thrombocytopenic purpura - platelets are around 70. May discuss with hematologist in a.m. 4. Patient has bilateral skin changes in the lower extremity likely from edema no definite signs of infection will closely observe. 5. Chronic low back pain on Percocet  which will be continued.   DVT prophylaxis: SCD since patient has thrombocytopenia. Code Status: full code.  Family Communication: discussed with patient.  Disposition Plan: home.  Consults called: none.  Admission status: inpatient.    Rise Patience MD [facility] Pager [phone].  If 7PM-7AM, please contact night-coverage [email or web address] Password [facility]  06/09/2017, 11:31 PM

## 2017-06-09 NOTE — ED Provider Notes (Signed)
[facility] DEPT Provider Note   CSN: [id number] Arrival date & time: 06/09/17  1701     History   Chief Complaint Chief Complaint  Patient presents with  . Leg Swelling  . Shortness of Breath    HPI [name] is a 66 y.o. female.  HPI  66 year old female presents with leg swelling and shortness of breath. The leg swelling has been ongoing for at least one month. It started in her feet and has progressed up to her knees and thighs. Its bilateral. She called EMS today because they are starting to weep. They have been red for several weeks. Has had abdominal distention as well during this time. Progressive dyspnea, present at rest and worse with exertion x 1 month. No chest pain/pressure. States she stopped smoking 2 years ago. Has been told she has HTN but is not on meds. Initial O2 sats 86% on room air. She feels like she's been urinating less than she should be for the past few months.  Past Medical History:  Diagnosis Date  . Arthritis    lumbar burst fx, osteoporosis  . Back pain 09/29/2013  . Blood dyscrasia   . Chest pain    denies  . Chronic kidney disease    passed stones spontaneously- 40 yrs. ago   . Depression    states her current situation with activity limitations, she has a low spirit at times.   Marland Kitchen Dysrhythmia    occ tachycardia  . Fracture of vertebra, compression ([place])   . GERD (gastroesophageal reflux disease)    occ  . History of kidney stones   . HTN (hypertension)   . ITP (idiopathic thrombocytopenic purpura) 08/29/2013  . Leukocytosis, unspecified 09/05/2013  . Muscle cramp 11/16/2013  . Tachycardia    occ upon exersion, and when put on bp med    Patient Active Problem List   Diagnosis Date Noted  . Acute congestive heart failure ([place]) 06/09/2017  . CHF (congestive heart failure) ([place]) 06/09/2017  . SBO (small bowel obstruction) ([place])   . Leukocytosis 11/13/2014  . SOB (shortness of breath) 11/13/2014  . Abdominal pain 11/13/2014  .  Sepsis ([place]) 11/13/2014  . GERD (gastroesophageal reflux disease) 11/13/2014  . Constipation 11/13/2014  . AP (abdominal pain)   . T12 compression fracture ([place]) 03/28/2014  . Tobacco abuse 03/10/2014  . Muscle cramp 11/16/2013  . Back pain 09/29/2013  . Idiopathic thrombocytopenic purpura ([place]) 08/29/2013  . Chest pain   . HTN (hypertension)     Past Surgical History:  Procedure Laterality Date  . ABDOMINAL HYSTERECTOMY    . ANTERIOR LAT LUMBAR FUSION N/A 03/28/2014   Procedure: Anterolateral decompression of L1 fracture with posterior segmental stabilization;  Surgeon: Kristeen Miss, MD;  Location: [facility];  Service: Neurosurgery;  Laterality: N/A;  Anterolateral decompression of Lumbar One fracture with posterior segmental stabilization  . CESAREAN SECTION     x2  . CHEST TUBE INSERTION Left 03/28/2014   Procedure: CHEST TUBE INSERTION;  Surgeon: Ivin Poot, MD;  Location: [facility];  Service: Thoracic;  Laterality: Left;  . INCISIONAL HERNIA REPAIR N/A 11/13/2014   Procedure: PRIMARY REPAIR INCARCERATED INCISIONAL HERNIA WITH PLACEMENT BIOLOGIC MESH;  Surgeon: Rolm Bookbinder, MD;  Location: [facility];  Service: General;  Laterality: N/A;  . LAPAROTOMY Right 09   ovarian cyst  . TOTAL ABDOMINAL HYSTERECTOMY W/ BILATERAL SALPINGOOPHORECTOMY  97    OB History    No data available  Home Medications    Prior to Admission medications   Medication Sig Start Date End Date Taking? Authorizing Provider  acetaminophen (TYLENOL) 500 MG tablet Take 1,000 mg by mouth every 6 (six) hours as needed for moderate pain.   Yes [provider]  diphenhydramine-acetaminophen (TYLENOL PM) 25-500 MG TABS Take 2 tablets by mouth at bedtime as needed.   Yes [provider]  oxyCODONE-acetaminophen (PERCOCET) 10-325 MG tablet Take 1 tablet by mouth every 8 (eight) hours as needed. 05/29/17  Yes [provider]  polyethylene glycol (MIRALAX / GLYCOLAX) packet  Take 17 g by mouth daily. Patient taking differently: Take 17 g by mouth daily as needed.  11/17/14  Yes Riebock, Emina, NP  folic acid (FOLVITE) 1 MG tablet Take 1 tablet (1 mg total) by mouth daily. Patient not taking: Reported on 11/13/2014 09/14/13   Heath Lark, MD  methocarbamol (ROBAXIN) 500 MG tablet Take 1 tablet (500 mg total) by mouth every 6 (six) hours as needed for muscle spasms. Patient not taking: Reported on 06/09/2017 04/04/14   Kristeen Miss, MD  morphine (MSIR) 15 MG tablet Take 1 tablet (15 mg total) by mouth every 6 (six) hours as needed for severe pain. Patient not taking: Reported on 11/13/2014 03/10/14   Heath Lark, MD  oxyCODONE-acetaminophen (PERCOCET/ROXICET) 5-325 MG per tablet Take 1-2 tablets by mouth every 4 (four) hours as needed for moderate pain. Patient not taking: Reported on 06/09/2017 11/17/14   Erby Pian, NP  vitamin B-12 (CYANOCOBALAMIN) 1000 MCG tablet Take 1 tablet (1,000 mcg total) by mouth daily. Patient not taking: Reported on 11/13/2014 09/14/13   Heath Lark, MD    Family History Family History  Problem Relation Age of Onset  . Hypertension Unknown   . Heart attack Unknown   . Leukemia Paternal Uncle        leukemia    Social History Social History  Substance Use Topics  . Smoking status: Former Smoker    Packs/day: 0.50    Years: 23.00    Types: Cigarettes    Quit date: 09/15/2014  . Smokeless tobacco: Never Used  . Alcohol use No     Allergies   Patient has no known allergies.   Review of Systems Review of Systems  Respiratory: Positive for shortness of breath. Negative for cough.   Cardiovascular: Positive for leg swelling. Negative for chest pain.  Gastrointestinal: Positive for abdominal distention. Negative for abdominal pain and vomiting.  Skin: Positive for color change.  All other systems reviewed and are negative.    Physical Exam Updated Vital Signs BP (!) 159/67 (BP Location: Right Arm)   Pulse 86   Temp 98.6  F (37 C) (Oral)   Resp 18   Ht 4\' 11"  (1.499 m)   Wt 77.1 kg (170 lb)   SpO2 93%   BMI 34.34 kg/m   Physical Exam  Constitutional: She is oriented to person, place, and time. She appears well-developed and well-nourished.  obese  HENT:  Head: Normocephalic and atraumatic.  Right Ear: External ear normal.  Left Ear: External ear normal.  Nose: Nose normal.  Eyes: Right eye exhibits no discharge. Left eye exhibits no discharge.  Cardiovascular: Normal rate, regular rhythm and normal heart sounds.   Pulmonary/Chest: Effort normal. She has rales (mild, bibasilar).  Abdominal: Soft. There is no tenderness.  Musculoskeletal: She exhibits edema (diffuse pitting edema to BLE).  BLE edema with what appears to be chronic venous stasis changes bilaterally. Some weeping  Neurological:  She is alert and oriented to person, place, and time.  Skin: Skin is warm and dry. She is not diaphoretic.  Nursing note and vitals reviewed.    ED Treatments / Results  Labs (all labs ordered are listed, but only abnormal results are displayed) Labs Reviewed  COMPREHENSIVE METABOLIC PANEL - Abnormal; Notable for the following:       Result Value   Calcium 8.2 (*)    Total Protein 5.8 (*)    All other components within normal limits  BRAIN NATRIURETIC PEPTIDE - Abnormal; Notable for the following:    B Natriuretic Peptide 477.7 (*)    All other components within normal limits  CBC WITH DIFFERENTIAL/PLATELET - Abnormal; Notable for the following:    WBC 10.7 (*)    RBC 5.50 (*)    HCT 49.3 (*)    Platelets 70 (*)    Neutro Abs 8.4 (*)    All other components within normal limits  URINALYSIS, ROUTINE W REFLEX MICROSCOPIC - Abnormal; Notable for the following:    Color, Urine STRAW (*)    Hgb urine dipstick SMALL (*)    All other components within normal limits  TROPONIN I  TROPONIN I  TROPONIN I  TROPONIN I  BASIC METABOLIC PANEL  HIV ANTIBODY (ROUTINE TESTING)  CBC  TSH  MAGNESIUM     EKG  EKG Interpretation  Date/Time:  Tuesday June 09 2017 17:18:03 EDT Ventricular Rate:  97 PR Interval:    QRS Duration: 95 QT Interval:  378 QTC Calculation: 481 R Axis:   164 Text Interpretation:  Sinus rhythm Ventricular premature complex Probable left atrial enlargement Consider right ventricular hypertrophy Borderline T abnormalities, anterior leads changes noted compared to Feb 2016 Confirmed by Sherwood Gambler [phone]) on 06/09/2017 5:27:56 PM       Radiology Dg Chest 2 View  Result Date: 06/09/2017 CLINICAL DATA:  Redness of breath, BILATERAL leg swelling for 1 month, history hypertension, former smoker EXAM: CHEST  2 VIEW COMPARISON:  03/31/2014 FINDINGS: Enlargement of cardiac silhouette. Mediastinal contours and pulmonary vascularity normal. Mild subsegmental atelectasis at lower lungs. No definite infiltrate or edema. No pleural effusion or pneumothorax. Bones diffusely demineralized. IMPRESSION: Enlargement of cardiac silhouette with bibasilar atelectasis. Electronically Signed   By: Lavonia Dana M.D.   On: 06/09/2017 18:05    Procedures Procedures (including critical care time)  Medications Ordered in ED Medications  polyethylene glycol (MIRALAX / GLYCOLAX) packet 17 g (not administered)  acetaminophen (TYLENOL) tablet 650 mg (not administered)    Or  acetaminophen (TYLENOL) suppository 650 mg (not administered)  furosemide (LASIX) injection 80 mg (not administered)  lisinopril (PRINIVIL,ZESTRIL) tablet 5 mg (not administered)  oxyCODONE-acetaminophen (PERCOCET/ROXICET) 5-325 MG per tablet 1 tablet (1 tablet Oral Given 06/10/17 0047)    And  oxyCODONE (Oxy IR/ROXICODONE) immediate release tablet 5 mg (not administered)  furosemide (LASIX) injection 40 mg (40 mg Intravenous Given 06/09/17 2109)  furosemide (LASIX) injection 40 mg (40 mg Intravenous Given 06/09/17 2241)     Initial Impression / Assessment and Plan / ED Course  I have reviewed the triage  vital signs and the nursing notes.  Pertinent labs & imaging results that were available during my care of the patient were reviewed by me and considered in my medical decision making (see chart for details).     Patient appears to have new onset CHF. Given hypoxia, will need diuresis and admission for workup. No acute ACS or PE symptoms. Lasix, admit.   Final Clinical  Impressions(s) / ED Diagnoses   Final diagnoses:  Acute congestive heart failure, unspecified heart failure type [facility])    New Prescriptions Current Discharge Medication List       Sherwood Gambler, MD 06/10/17 [phone]

## 2017-06-09 NOTE — ED Triage Notes (Signed)
Per [facility],  Pt from home. Pt does not see PCP currently. Pt has hx of HTN, does not take medication. Pt complaining of leg swelling worsening over the past couple of months. Pt has some redness noted to both legs. EMS reports rales in bases/ expiratory wheezing. Pt has abdominal distension. Pt reports some weight gain over the past couple of months. Pt's O2 sat 86% on room air. EMS placed pt on 2L [place], O2 sats increased to 96%. Pt alert and oriented x4. Pts initial BP with EMS was 210/140, decreased to 186/114.

## 2017-06-09 NOTE — ED Notes (Signed)
ED Provider at bedside. 

## 2017-06-09 NOTE — ED Notes (Signed)
Attempted report to 3E. This nurse was told that the nurse receiving pt was busy and to call back.

## 2017-06-09 NOTE — ED Notes (Signed)
Patient transported to X-ray 

## 2017-06-10 ENCOUNTER — Inpatient Hospital Stay (HOSPITAL_COMMUNITY): Payer: BC Managed Care – PPO

## 2017-06-10 DIAGNOSIS — I361 Nonrheumatic tricuspid (valve) insufficiency: Secondary | ICD-10-CM

## 2017-06-10 DIAGNOSIS — R609 Edema, unspecified: Secondary | ICD-10-CM

## 2017-06-10 LAB — CBC
HCT: 48.4 % — ABNORMAL HIGH (ref 36.0–46.0)
Hemoglobin: 14.2 g/dL (ref 12.0–15.0)
MCH: 26.5 pg (ref 26.0–34.0)
MCHC: 29.3 g/dL — ABNORMAL LOW (ref 30.0–36.0)
MCV: 90.3 fL (ref 78.0–100.0)
Platelets: 68 10*3/uL — ABNORMAL LOW (ref 150–400)
RBC: 5.36 MIL/uL — ABNORMAL HIGH (ref 3.87–5.11)
RDW: 15.1 % (ref 11.5–15.5)
WBC: 11 10*3/uL — ABNORMAL HIGH (ref 4.0–10.5)

## 2017-06-10 LAB — ECHOCARDIOGRAM COMPLETE
Height: 58 in
Weight: 3463.87 oz

## 2017-06-10 LAB — BASIC METABOLIC PANEL
Anion gap: 11 (ref 5–15)
BUN: 9 mg/dL (ref 6–20)
CO2: 33 mmol/L — ABNORMAL HIGH (ref 22–32)
Calcium: 8.2 mg/dL — ABNORMAL LOW (ref 8.9–10.3)
Chloride: 98 mmol/L — ABNORMAL LOW (ref 101–111)
Creatinine, Ser: 0.79 mg/dL (ref 0.44–1.00)
GFR calc Af Amer: >60 mL/min (ref >60)
GFR calc non Af Amer: >60 mL/min (ref >60)
Glucose, Bld: 88 mg/dL (ref 65–99)
Potassium: 3.7 mmol/L (ref 3.5–5.1)
Sodium: 142 mmol/L (ref 135–145)

## 2017-06-10 LAB — TROPONIN I
Troponin I: <0.03 ng/mL (ref <0.03)
Troponin I: <0.03 ng/mL (ref <0.03)
Troponin I: <0.03 ng/mL (ref <0.03)

## 2017-06-10 LAB — MAGNESIUM: Magnesium: 1.7 mg/dL (ref 1.7–2.4)

## 2017-06-10 LAB — HIV ANTIBODY (ROUTINE TESTING W REFLEX): HIV Screen 4th Generation wRfx: NONREACTIVE

## 2017-06-10 LAB — TSH: TSH: 1.678 u[IU]/mL (ref 0.350–4.500)

## 2017-06-10 MED ORDER — MAGNESIUM OXIDE 400 (241.3 MG) MG PO TABS
400.0000 mg | ORAL_TABLET | Freq: Every day | ORAL | Status: DC
  Administered 2017-06-10 – 2017-06-20 (×11): 400 mg via ORAL
  Filled 2017-06-10 (×11): qty 1

## 2017-06-10 MED ORDER — POTASSIUM CHLORIDE CRYS ER 20 MEQ PO TBCR
20.0000 meq | EXTENDED_RELEASE_TABLET | Freq: Every day | ORAL | Status: DC
Start: 2017-06-10 — End: 2017-06-12
  Administered 2017-06-10 – 2017-06-12 (×3): 20 meq via ORAL
  Filled 2017-06-10 (×2): qty 2
  Filled 2017-06-10: qty 1
  Filled 2017-06-10: qty 2
  Filled 2017-06-10 (×2): qty 1

## 2017-06-10 MED ORDER — CARVEDILOL 3.125 MG PO TABS
3.1250 mg | ORAL_TABLET | Freq: Two times a day (BID) | ORAL | Status: DC
  Administered 2017-06-10 – 2017-06-14 (×8): 3.125 mg via ORAL
  Filled 2017-06-10 (×8): qty 1

## 2017-06-10 NOTE — Consult Note (Addendum)
Referring Physician: Dr. Verneita Griffes, MD  [name] is an 66 y.o. female.                       Chief Complaint: Shortness of breath and leg edema.  HPI: 66 year old female with PMH of hypertension, chronic low back pain, thrombocytopenia, arthritis and h/o kidney stone has chest pain with shortness of breath and leg edema. Patient quit smoking 2 years ago but remained non-compliant on anti-hypertensive medications intake. Echocardiogram shows Preserved LV systolic function, mild diastolic dysfunction and mild AI. BNP is elevated at 477.7  Past Medical History:  Diagnosis Date  . Arthritis    lumbar burst fx, osteoporosis  . Back pain 09/29/2013  . Blood dyscrasia   . Chest pain    denies  . Chronic kidney disease    passed stones spontaneously- 40 yrs. ago   . Depression    states her current situation with activity limitations, she has a low spirit at times.   Marland Kitchen Dysrhythmia    occ tachycardia  . Fracture of vertebra, compression ([place])   . GERD (gastroesophageal reflux disease)    occ  . History of kidney stones   . HTN (hypertension)   . ITP (idiopathic thrombocytopenic purpura) 08/29/2013  . Leukocytosis, unspecified 09/05/2013  . Muscle cramp 11/16/2013  . Tachycardia    occ upon exersion, and when put on bp med      Past Surgical History:  Procedure Laterality Date  . ABDOMINAL HYSTERECTOMY    . ANTERIOR LAT LUMBAR FUSION N/A 03/28/2014   Procedure: Anterolateral decompression of L1 fracture with posterior segmental stabilization;  Surgeon: Kristeen Miss, MD;  Location: [facility];  Service: Neurosurgery;  Laterality: N/A;  Anterolateral decompression of Lumbar One fracture with posterior segmental stabilization  . CESAREAN SECTION     x2  . CHEST TUBE INSERTION Left 03/28/2014   Procedure: CHEST TUBE INSERTION;  Surgeon: Ivin Poot, MD;  Location: [facility];  Service: Thoracic;  Laterality: Left;  . INCISIONAL HERNIA REPAIR N/A 11/13/2014   Procedure:  PRIMARY REPAIR INCARCERATED INCISIONAL HERNIA WITH PLACEMENT BIOLOGIC MESH;  Surgeon: Rolm Bookbinder, MD;  Location: [facility];  Service: General;  Laterality: N/A;  . LAPAROTOMY Right 09   ovarian cyst  . TOTAL ABDOMINAL HYSTERECTOMY W/ BILATERAL SALPINGOOPHORECTOMY  54    Family History  Problem Relation Age of Onset  . Hypertension Unknown   . Heart attack Unknown   . Leukemia Paternal Uncle        leukemia   Social History:  reports that she quit smoking about 2 years ago. Her smoking use included Cigarettes. She has a 11.50 pack-year smoking history. She has never used smokeless tobacco. She reports that she does not drink alcohol or use drugs.  Allergies: No Known Allergies  Medications Prior to Admission  Medication Sig Dispense Refill  . acetaminophen (TYLENOL) 500 MG tablet Take 1,000 mg by mouth every 6 (six) hours as needed for moderate pain.    . diphenhydramine-acetaminophen (TYLENOL PM) 25-500 MG TABS Take 2 tablets by mouth at bedtime as needed.    Marland Kitchen oxyCODONE-acetaminophen (PERCOCET) 10-325 MG tablet Take 1 tablet by mouth every 8 (eight) hours as needed.    . polyethylene glycol (MIRALAX / GLYCOLAX) packet Take 17 g by mouth daily. (Patient taking differently: Take 17 g by mouth daily as needed. ) 14 each 0  . folic acid (FOLVITE) 1 MG tablet Take 1 tablet (1 mg  total) by mouth daily. (Patient not taking: Reported on 11/13/2014) 30 tablet 1  . methocarbamol (ROBAXIN) 500 MG tablet Take 1 tablet (500 mg total) by mouth every 6 (six) hours as needed for muscle spasms. (Patient not taking: Reported on 06/09/2017) 60 tablet 3  . morphine (MSIR) 15 MG tablet Take 1 tablet (15 mg total) by mouth every 6 (six) hours as needed for severe pain. (Patient not taking: Reported on 11/13/2014) 90 tablet 0  . oxyCODONE-acetaminophen (PERCOCET/ROXICET) 5-325 MG per tablet Take 1-2 tablets by mouth every 4 (four) hours as needed for moderate pain. (Patient not taking: Reported on 06/09/2017) 40  tablet 0  . vitamin B-12 (CYANOCOBALAMIN) 1000 MCG tablet Take 1 tablet (1,000 mcg total) by mouth daily. (Patient not taking: Reported on 11/13/2014) 30 tablet 1    Results for orders placed or performed during the hospital encounter of 06/09/17 (from the past 48 hour(s))  Comprehensive metabolic panel     Status: Abnormal   Collection Time: 06/09/17  5:24 PM  Result Value Ref Range   Sodium 137 135 - 145 mmol/L   Potassium 4.0 3.5 - 5.1 mmol/L   Chloride 103 101 - 111 mmol/L   CO2 28 22 - 32 mmol/L   Glucose, Bld 98 65 - 99 mg/dL   BUN 11 6 - 20 mg/dL   Creatinine, Ser 0.72 0.44 - 1.00 mg/dL   Calcium 8.2 (L) 8.9 - 10.3 mg/dL   Total Protein 5.8 (L) 6.5 - 8.1 g/dL   Albumin 3.8 3.5 - 5.0 g/dL   AST 16 15 - 41 U/L   ALT 22 14 - 54 U/L   Alkaline Phosphatase 62 38 - 126 U/L   Total Bilirubin 0.8 0.3 - 1.2 mg/dL   GFR calc non Af Amer >60 >60 mL/min   GFR calc Af Amer >60 >60 mL/min    Comment: (NOTE) The eGFR has been calculated using the CKD EPI equation. This calculation has not been validated in all clinical situations. eGFR's persistently <60 mL/min signify possible Chronic Kidney Disease.    Anion gap 6 5 - 15  Troponin I     Status: None   Collection Time: 06/09/17  5:24 PM  Result Value Ref Range   Troponin I <0.03 <0.03 ng/mL  CBC with Differential     Status: Abnormal   Collection Time: 06/09/17  5:24 PM  Result Value Ref Range   WBC 10.7 (H) 4.0 - 10.5 K/uL   RBC 5.50 (H) 3.87 - 5.11 MIL/uL   Hemoglobin 14.9 12.0 - 15.0 g/dL   HCT 49.3 (H) 36.0 - 46.0 %   MCV 89.6 78.0 - 100.0 fL   MCH 27.1 26.0 - 34.0 pg   MCHC 30.2 30.0 - 36.0 g/dL   RDW 15.0 11.5 - 15.5 %   Platelets 70 (L) 150 - 400 K/uL    Comment: REPEATED TO VERIFY PLATELET COUNT CONFIRMED BY SMEAR    Neutrophils Relative % 78 %   Neutro Abs 8.4 (H) 1.7 - 7.7 K/uL   Lymphocytes Relative 13 %   Lymphs Abs 1.4 0.7 - 4.0 K/uL   Monocytes Relative 7 %   Monocytes Absolute 0.7 0.1 - 1.0 K/uL    Eosinophils Relative 2 %   Eosinophils Absolute 0.2 0.0 - 0.7 K/uL   Basophils Relative 0 %   Basophils Absolute 0.0 0.0 - 0.1 K/uL  Brain natriuretic peptide     Status: Abnormal   Collection Time: 06/09/17  5:26 PM  Result  Value Ref Range   B Natriuretic Peptide 477.7 (H) 0.0 - 100.0 pg/mL  Urinalysis, Routine w reflex microscopic     Status: Abnormal   Collection Time: 06/09/17  8:44 PM  Result Value Ref Range   Color, Urine STRAW (A) YELLOW   APPearance CLEAR CLEAR   Specific Gravity, Urine 1.005 1.005 - 1.030   pH 6.0 5.0 - 8.0   Glucose, UA NEGATIVE NEGATIVE mg/dL   Hgb urine dipstick SMALL (A) NEGATIVE   Bilirubin Urine NEGATIVE NEGATIVE   Ketones, ur NEGATIVE NEGATIVE mg/dL   Protein, ur NEGATIVE NEGATIVE mg/dL   Nitrite NEGATIVE NEGATIVE   Leukocytes, UA NEGATIVE NEGATIVE   RBC / HPF 0-5 0 - 5 RBC/hpf   WBC, UA NONE SEEN 0 - 5 WBC/hpf   Bacteria, UA NONE SEEN NONE SEEN   Squamous Epithelial / LPF NONE SEEN NONE SEEN  Troponin I (q 6hr x 3)     Status: None   Collection Time: 06/10/17 12:28 AM  Result Value Ref Range   Troponin I <0.03 <0.03 ng/mL  HIV antibody (Routine Testing)     Status: None   Collection Time: 06/10/17 12:28 AM  Result Value Ref Range   HIV Screen 4th Generation wRfx Non Reactive Non Reactive    Comment: (NOTE) Performed At: [facility] [address] [place], [place] [id number] Mila Homer MD [id number]   TSH     Status: None   Collection Time: 06/10/17 12:28 AM  Result Value Ref Range   TSH 1.678 0.350 - 4.500 uIU/mL    Comment: Performed by a 3rd Generation assay with a functional sensitivity of <=0.01 uIU/mL.  Magnesium     Status: None   Collection Time: 06/10/17 12:28 AM  Result Value Ref Range   Magnesium 1.7 1.7 - 2.4 mg/dL  Troponin I (q 6hr x 3)     Status: None   Collection Time: 06/10/17  5:28 AM  Result Value Ref Range   Troponin I <0.03 <0.03 ng/mL  Basic metabolic panel     Status: Abnormal   Collection  Time: 06/10/17  5:28 AM  Result Value Ref Range   Sodium 142 135 - 145 mmol/L   Potassium 3.7 3.5 - 5.1 mmol/L   Chloride 98 (L) 101 - 111 mmol/L   CO2 33 (H) 22 - 32 mmol/L   Glucose, Bld 88 65 - 99 mg/dL   BUN 9 6 - 20 mg/dL   Creatinine, Ser 8.89 0.44 - 1.00 mg/dL   Calcium 8.2 (L) 8.9 - 10.3 mg/dL   GFR calc non Af Amer >60 >60 mL/min   GFR calc Af Amer >60 >60 mL/min    Comment: (NOTE) The eGFR has been calculated using the CKD EPI equation. This calculation has not been validated in all clinical situations. eGFR's persistently <60 mL/min signify possible Chronic Kidney Disease.    Anion gap 11 5 - 15  CBC     Status: Abnormal   Collection Time: 06/10/17  5:28 AM  Result Value Ref Range   WBC 11.0 (H) 4.0 - 10.5 K/uL   RBC 5.36 (H) 3.87 - 5.11 MIL/uL   Hemoglobin 14.2 12.0 - 15.0 g/dL    Comment: CONSISTENT WITH PREVIOUS RESULT   HCT 48.4 (H) 36.0 - 46.0 %   MCV 90.3 78.0 - 100.0 fL   MCH 26.5 26.0 - 34.0 pg   MCHC 29.3 (L) 30.0 - 36.0 g/dL   RDW 33.8 82.6 - 66.6 %   Platelets  68 (L) 150 - 400 K/uL    Comment: PLATELET COUNT CONFIRMED BY SMEAR  Troponin I (q 6hr x 3)     Status: None   Collection Time: 06/10/17 10:54 AM  Result Value Ref Range   Troponin I <0.03 <0.03 ng/mL   Dg Chest 2 View  Result Date: 06/09/2017 CLINICAL DATA:  Redness of breath, BILATERAL leg swelling for 1 month, history hypertension, former smoker EXAM: CHEST  2 VIEW COMPARISON:  03/31/2014 FINDINGS: Enlargement of cardiac silhouette. Mediastinal contours and pulmonary vascularity normal. Mild subsegmental atelectasis at lower lungs. No definite infiltrate or edema. No pleural effusion or pneumothorax. Bones diffusely demineralized. IMPRESSION: Enlargement of cardiac silhouette with bibasilar atelectasis. Electronically Signed   By: Lavonia Dana M.D.   On: 06/09/2017 18:05    Review Of Systems Constitutional: No fever, chills, positive weight gain. Eyes: No vision change, wears glasses. No  discharge or pain. Ears: No hearing loss, No tinnitus. Respiratory: No asthma, Mild COPD, pneumonias. Positive shortness of breath. No hemoptysis. Cardiovascular: Positive chest pain, palpitation, leg edema. Gastrointestinal: No nausea, vomiting, diarrhea, constipation. No GI bleed. No hepatitis. Genitourinary: No dysuria, hematuria, kidney stone. No incontinance. Neurological: No headache, stroke, seizures.  Psychiatry: No psych facility admission for anxiety, depression, suicide. No detox. Skin: No rash. Musculoskeletal: Positive joint pain, fibromyalgia. Positive neck pain, back pain. Lymphadenopathy: No lymphadenopathy. Hematology: No anemia or easy bruising. Positive Thrombocytopenia.   Blood pressure 128/70, pulse 97, temperature 99.2 F (37.3 C), temperature source Oral, resp. rate 18, height _0  (1.473 m), weight 98.2 kg (216 lb 7.9 oz), SpO2 95 %. Body mass index is 45.25 kg/m. General appearance: alert, cooperative, appears stated age and mild re distress Head: Normocephalic, atraumatic. Eyes: Blue eyes, pink conjunctiva, corneas clear. PERRL, EOM's intact. Neck: No adenopathy, no carotid bruit, no JVD, supple, symmetrical, trachea midline and thyroid not enlarged. Resp: Clearing to auscultation bilaterally. Cardio: Regular rate and rhythm, S1, S2 normal, II/VI systolic murmur, no click, rub or gallop GI: Soft, non-tender; bowel sounds normal; no organomegaly. Extremities: 2 + edema, cyanosis or clubbing. Bilateral lower legvenous stasis dermatitis Skin: Warm and dry.  Neurologic: Alert and oriented X 3, normal strength. Normal coordination and slow gait.  Assessment/Plan Acute diastolic heart failure Hypertrophic non-obstructive cardiomyopathy. Hypertension Morbid obesity Thrombocytopenia  R + L heart cath in 2 days. Continue diuresis. Add carvedilol. Continue lisinopril.  Birdie Riddle, MD  06/10/2017, 2:54 PM

## 2017-06-10 NOTE — Care Management Note (Signed)
Case Management Note  Patient Details  Name: [name] MRN: [id number] Date of Birth: 26-Nov-1950  Subjective/Objective:     CHF              Action/Plan: Patient lives at home; PCP: [name], NP; has private insurance with BCBS with prescription drug coverage; CM following for DCP  Expected Discharge Date:possibly 06/14/2017            Expected Discharge Plan:  Home/Self Care, possibly [place] awaiting on Physical Therapy eval  Discharge planning Services  CM Consult    Status of Service:  In process, will continue to follow  Sherrilyn Rist [phone] 06/10/2017, 10:17 AM

## 2017-06-10 NOTE — Evaluation (Signed)
Physical Therapy Evaluation Patient Details Name: [name] MRN: [id number] DOB: [birth year] Today's Date: 06/10/2017   History of Present Illness  [name] is a 66 y.o. female with history of hypertension chronic low back pain diopathic thrombocytopenia many purpura has not been to a physician for long time presents to the ER with complaints of worsening shortness of breath and increasing lower extremity edema.  Clinical Impression   Pt admitted with above diagnosis. Pt currently with functional limitations due to the deficits listed below (see PT Problem List). Presents with decr functional capacity, decr endurance, and dependence on supplemental O2 at this time;  Pt will benefit from skilled PT to increase their independence and safety with mobility to allow discharge to the venue listed below.       Follow Up Recommendations Home health PT; [facility] for chronic disease Management    Equipment Recommendations  Rolling walker with 5" wheels;3in1 (PT) (will consider rollator)    Recommendations for Other Services OT consult     Precautions / Restrictions Precautions Precautions: Fall Precaution Comments: Watch O2 sats      Mobility  Bed Mobility Overal bed mobility: Needs Assistance Bed Mobility: Sit to Supine       Sit to supine: Mod assist   General bed mobility comments: Mod assist and cues to help bil LEs into be and then move to center of bed  Transfers Overall transfer level: Needs assistance Equipment used: Rolling walker (2 wheeled) Transfers: Sit to/from Stand Sit to Stand: Min guard         General transfer comment: Cues for hand placement and safety  Ambulation/Gait Ambulation/Gait assistance: Min guard Ambulation Distance (Feet): 12 Feet Assistive device: Rolling walker (2 wheeled) Gait Pattern/deviations: Decreased step length - right;Decreased step length - left;Trunk flexed     General Gait Details: Cues to self-monitor for activity  tolerance; gait distance limited by R thigh cramping  Stairs            Wheelchair Mobility    Modified Rankin (Stroke Patients Only)       Balance                                             Pertinent Vitals/Pain Pain Assessment: Faces Faces Pain Scale: Hurts even more Pain Location:  R anterior proximal thigh cramping Pain Descriptors / Indicators: Cramping Pain Intervention(s): Limited activity within patient's tolerance;Monitored during session    Home Living Family/patient expects to be discharged to:: Private residence Living Arrangements: Children Available Help at Discharge: Family;Available PRN/intermittently Type of Home: House Home Access: Stairs to enter Entrance Stairs-Rails: Right Entrance Stairs-Number of Steps: 3 Home Layout: One level Home Equipment: None Additional Comments: tells me she occasionally uses her reacher as a cane    Prior Function Level of Independence: Independent         Comments: Reports has recently been having difficulty getting short of breath walking in home     Hand Dominance        Extremity/Trunk Assessment   Upper Extremity Assessment Upper Extremity Assessment: Generalized weakness    Lower Extremity Assessment Lower Extremity Assessment: Generalized weakness (bil ankle edema)       Communication   Communication: No difficulties  Cognition Arousal/Alertness: Awake/alert Behavior During Therapy: WFL for tasks assessed/performed Overall Cognitive Status: Within Functional Limits for tasks assessed  General Comments General comments (skin integrity, edema, etc.):  SATURATION QUALIFICATIONS: (This note is used to comply with regulatory documentation for home oxygen)  Patient Saturations on Room Air at Rest = 89%  Patient Saturations on Room Air while Ambulating = 84%  Patient Saturations on 2 Liters of oxygen while Ambulating =  91%  Please briefly explain why patient needs home oxygen: Patient requires supplemental oxygen to maintain oxygen saturations at acceptable, safe levels with physical activity.     Exercises     Assessment/Plan    PT Assessment Patient needs continued PT services  PT Problem List Decreased strength;Decreased range of motion;Decreased activity tolerance;Decreased balance;Decreased mobility;Decreased knowledge of use of DME;Decreased safety awareness;Decreased knowledge of precautions;Cardiopulmonary status limiting activity;Obesity       PT Treatment Interventions DME instruction;Gait training;Stair training;Functional mobility training;Therapeutic activities;Therapeutic exercise;Patient/family education    PT Goals (Current goals can be found in the Care Plan section)  Acute Rehab PT Goals Patient Stated Goal: Hopes to get home PT Goal Formulation: With patient Time For Goal Achievement: 06/24/17 Potential to Achieve Goals: Good    Frequency Min 3X/week   Barriers to discharge        Co-evaluation               AM-PAC PT "6 Clicks" Daily Activity  Outcome Measure Difficulty turning over in bed (including adjusting bedclothes, sheets and blankets)?: A Lot Difficulty moving from lying on back to sitting on the side of the bed? : A Little Difficulty sitting down on and standing up from a chair with arms (e.g., wheelchair, bedside commode, etc,.)?: A Little Help needed moving to and from a bed to chair (including a wheelchair)?: A Little Help needed walking in hospital room?: A Little Help needed climbing 3-5 steps with a railing? : A Little 6 Click Score: 17    End of Session Equipment Utilized During Treatment: Oxygen Activity Tolerance: Patient limited by pain Patient left: in bed;Other (comment) (preparing to go to [facility]) Nurse Communication: Mobility status PT Visit Diagnosis: Unsteadiness on feet (R26.81);Other abnormalities of gait and mobility (R26.89)     Time: 7096-2836 PT Time Calculation (min) (ACUTE ONLY): 29 min   Charges:   PT Evaluation $PT Eval Moderate Complexity: 1 Mod PT Treatments $Gait Training: 8-22 mins   PT G Codes:        Roney Marion, PT  Acute Rehabilitation Services Pager [phone] Office [phone]   Colletta [place] 06/10/2017, 12:46 PM

## 2017-06-10 NOTE — Progress Notes (Signed)
  Echocardiogram 2D Echocardiogram has been performed.  Jennette Dubin 06/10/2017, 12:17 PM

## 2017-06-10 NOTE — Progress Notes (Signed)
VASCULAR LAB PRELIMINARY  PRELIMINARY  PRELIMINARY  PRELIMINARY  Bilateral lower extremity venous duplex completed.    Preliminary report:  Bilateral:  No evidence of DVT, superficial thrombosis, or Baker's Cyst.   [name], RVS 06/10/2017, 12:47 PM

## 2017-06-10 NOTE — Progress Notes (Signed)
[name] AQT:[id number] DOB: Apr 12, 1951 DOA: 06/09/2017 PCP: Evelyn Beals, NP  Brief narrative:  66 year old ? Hypertension Known chronic back pain Idiopathic thrombocytopenic purpura followed by Dr. Alvy Bimler Prior incisional hernia with obstruction status post exploratory laparoscopy 11/17/2014 Prior T12 compression fracture Prior large adnexal mass? 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  Admitted overnight  Past medical history-As per Problem list Chart reviewed as below- reviewed  Consultants:  Cardiology  Procedures:    Antibiotics:     Subjective  Doing fair Shortness of breath is better Tells me that one year ago she could walk the length of her driveway and 3-4 months ago she started having to take breaks in between Has been sleeping in a recliner for the past 2 months because of her back pain but also shortness of breath States ran out of metoprolol over a year ago and has not taken any antihypertensive agents since Overall feels improved from prior Understands she may need heart cath and has spoken to the cardiologist   Objective    Interim History:   Telemetry: none   Objective: Vitals:   06/09/17 2300 06/09/17 2306 06/10/17 0000 06/10/17 0500  BP: (!) 157/72  (!) 159/67 (!) 143/67  Pulse: 90  86 98  Resp: (!) 23  18 18   Temp:   98.6 F (37 C) 98.5 F (36.9 C)  TempSrc:   Oral Oral  SpO2: 91% 95% 93% 96%  Weight:    98.2 kg (216 lb 7.9 oz)  Height:    4\' 10"  (1.473 m)    Intake/Output Summary (Last 24 hours) at 06/10/17 0929 Last data filed at 06/10/17 5625  Gross per 24 hour  Intake              240 ml  Output             3300 ml  Net            -3060 ml    Exam:  General: bese pleasant Mallampati 4 Cardiovascular: S1 and S2 holosystolic murmur, no JVD, no bruit Respiratory: clinically clear without added sound rails nor rhonchi Abdomen: obese nontender nondistended Skinlower extremity edema  3+ bilateral pitting no rash nails are normal Neurointact alert oriented power 5/5 grossly smile symmetric  Data Reviewed: Basic Metabolic Panel:  Recent Labs Lab 06/09/17 1724 06/10/17 0028 06/10/17 0528  NA 137  --  142  K 4.0  --  3.7  CL 103  --  98*  CO2 28  --  33*  GLUCOSE 98  --  88  BUN 11  --  9  CREATININE 0.72  --  0.79  CALCIUM 8.2*  --  8.2*  MG  --  1.7  --    Liver Function Tests:  Recent Labs Lab 06/09/17 1724  AST 16  ALT 22  ALKPHOS 62  BILITOT 0.8  PROT 5.8*  ALBUMIN 3.8   No results for input(s): LIPASE, AMYLASE in the last 168 hours. No results for input(s): AMMONIA in the last 168 hours. CBC:  Recent Labs Lab 06/09/17 1724 06/10/17 0528  WBC 10.7* 11.0*  NEUTROABS 8.4*  --   HGB 14.9 14.2  HCT 49.3* 48.4*  MCV 89.6 90.3  PLT 70* 68*   Cardiac Enzymes:  Recent Labs Lab 06/09/17 1724 06/10/17 0028 06/10/17 0528  TROPONINI <0.03 <0.03 <0.03   BNP: Invalid input(s): POCBNP CBG: No results for input(s): GLUCAP in the last 168 hours.  No results found for this  or any previous visit (from the past 240 hour(s)).   Studies:              All Imaging reviewed and is as per above notation   Scheduled Meds: . furosemide  80 mg Intravenous Q12H  . lisinopril  5 mg Oral Daily   Continuous Infusions:   Assessment/Plan:  NYHA class 2-3 heart failure symptoms [place] of obstructive hypoventilation syndrome Body mass index is 45.25 kg/m.   Has already diuresed 3 L  Her weight on discharge in 2016 was 73 kg. She is currently 98.2 kg  She has a way to go in terms of diuresis-for now continue 80 IV ii/dayLasix  Heart cath as per cardiology to determine etiology--lisinopril 5 has been added by cardiology  Will need careful reinitiation of hypertension meds and will need simplicity with regimen for compliance sake  TTP lost to follow-up  We will cc Dr. Alvy Bimler as an [facility] [place] and will encourage patient to follow-up as an outpatient with  her  Places such stable in the 60-70 range and she is not having any bleeding of any kind  Medical noncompliance  Have encouraged the patient to take her meds as when necessary  Prior history serous cystadenoma status post resection 10/2007 Prior history incisional hernia repair status post exploratory laparoscopy 2016 Primary history T12 vertebral compression fracture status post repair  Her back pain should be managed with non-OP    Inpatient Has a ways to go regarding diuresis to reach goal weight Scheduled for heart cath at some point this admission No family present  Verneita Griffes, MD  [facility] Pager [phone] 06/10/2017, 9:29 AM    LOS: 1 day

## 2017-06-11 ENCOUNTER — Encounter (HOSPITAL_COMMUNITY): Payer: Self-pay

## 2017-06-11 LAB — BASIC METABOLIC PANEL
Anion gap: 11 (ref 5–15)
BUN: 9 mg/dL (ref 6–20)
CO2: 36 mmol/L — ABNORMAL HIGH (ref 22–32)
Calcium: 8.3 mg/dL — ABNORMAL LOW (ref 8.9–10.3)
Chloride: 93 mmol/L — ABNORMAL LOW (ref 101–111)
Creatinine, Ser: 0.72 mg/dL (ref 0.44–1.00)
GFR calc Af Amer: >60 mL/min (ref >60)
GFR calc non Af Amer: >60 mL/min (ref >60)
Glucose, Bld: 98 mg/dL (ref 65–99)
Potassium: 3.7 mmol/L (ref 3.5–5.1)
Sodium: 140 mmol/L (ref 135–145)

## 2017-06-11 LAB — CBC
HCT: 50.7 % — ABNORMAL HIGH (ref 36.0–46.0)
Hemoglobin: 14.8 g/dL (ref 12.0–15.0)
MCH: 26.2 pg (ref 26.0–34.0)
MCHC: 29.2 g/dL — ABNORMAL LOW (ref 30.0–36.0)
MCV: 89.7 fL (ref 78.0–100.0)
Platelets: DECREASED 10*3/uL (ref 150–400)
RBC: 5.65 MIL/uL — ABNORMAL HIGH (ref 3.87–5.11)
RDW: 15.3 % (ref 11.5–15.5)
WBC: 11.2 10*3/uL — ABNORMAL HIGH (ref 4.0–10.5)

## 2017-06-11 MED ORDER — ORAL CARE MOUTH RINSE
15.0000 mL | Freq: Two times a day (BID) | OROMUCOSAL | Status: DC
  Administered 2017-06-11 – 2017-06-20 (×15): 15 mL via OROMUCOSAL

## 2017-06-11 NOTE — Plan of Care (Signed)
Problem: Food- and Nutrition-Related Knowledge Deficit (NB-1.1) Goal: Nutrition education Formal process to instruct or train a patient/client in a skill or to impart knowledge to help patients/clients voluntarily manage or modify food choices and eating behavior to maintain or improve health. Outcome: Adequate for Discharge Nutrition Education Note  RD consulted for nutrition education regarding new onset CHF.  [name] describes a diet PTA that consists mostly of fast food. She also complains that she "has not felt good for a long time" and thus rarely wants to cook. Currently lives with her daughter and her daughter's boyfriend. Daughter cooks from time to time. Encouraged patient to transition to cooking/eating at home seeing as there are few opportunities for eating a low sodium diet when eating out regularly. She states daughter has already told patient there will be dietary changes made at home.   RD provided "Low Sodium Nutrition Therapy" handout from the [employer]. Reviewed patient's dietary recall. Provided examples on ways to decrease sodium intake in diet. Discouraged intake of processed foods and use of salt shaker. Encouraged fresh fruits and vegetables as well as whole grain sources of carbohydrates to maximize fiber intake.   RD discussed why it is important for patient to adhere to diet recommendations, and emphasized the role of fluids, foods to avoid, and importance of weighing self daily. Teach back method used.  Expect fair compliance.  Body mass index is 43.97 kg/m. Pt meets criteria for obese class III based on current BMI.  Current diet order is heart healthy, patient is consuming approximately 100% of meals at this time. Labs and medications reviewed. No further nutrition interventions warranted at this time. RD contact information provided. If additional nutrition issues arise, please re-consult RD.   Satira Anis. Doran Nestle, MS, RD LDN Inpatient  Clinical Dietitian Pager [phone]

## 2017-06-11 NOTE — Progress Notes (Signed)
[name] [id number] DOB: 06-Jul-1951 DOA: 06/09/2017 PCP: [name], NP  Brief narrative:  66 year old ? Hypertension Known chronic back pain Idiopathic thrombocytopenic purpura followed by Dr. Alvy Bimler Prior incisional hernia with obstruction status post exploratory laparoscopy 11/17/2014 Prior T12 compression fracture Prior large adnexal mass? 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  Admitted overnight  Past medical history-As per Problem list Chart reviewed as below- reviewed  Consultants:  Cardiology  Procedures:    Antibiotics:     Subjective   Fair Still swollen no cp eating well No weakness   Objective    Interim History:   Telemetry: none   Objective: Vitals:   06/11/17 0011 06/11/17 0419 06/11/17 0556 06/11/17 0748  BP: 139/60 (!) 105/43 (!) 127/50 (!) 140/55  Pulse: 87 83  84  Resp: 18 18    Temp: 98.3 F (36.8 C) 98.1 F (36.7 C)  98 F (36.7 C)  TempSrc: Oral Oral  Oral  SpO2: 94% 93%  96%  Weight:  95.4 kg (210 lb 6.4 oz)    Height:        Intake/Output Summary (Last 24 hours) at 06/11/17 1019 Last data filed at 06/11/17 0902  Gross per 24 hour  Intake              480 ml  Output              501 ml  Net              -21 ml    Exam:  General: pleasant alert Cardiovascular: S1 and S2 HSM no jvd Respiratory: cta bi Abdomen: obese nontender nondistended Skin: lower extremity edema 3+ still Neurointact alert oriented power 5/5 grossly smile symmetric  Data Reviewed: Basic Metabolic Panel:  Recent Labs Lab 06/09/17 1724 06/10/17 0028 06/10/17 0528 06/11/17 0622  NA 137  --  142 140  K 4.0  --  3.7 3.7  CL 103  --  98* 93*  CO2 28  --  33* 36*  GLUCOSE 98  --  88 98  BUN 11  --  9 9  CREATININE 0.72  --  0.79 0.72  CALCIUM 8.2*  --  8.2* 8.3*  MG  --  1.7  --   --    Liver Function Tests:  Recent Labs Lab 06/09/17 1724  AST 16  ALT 22  ALKPHOS 62  BILITOT 0.8    PROT 5.8*  ALBUMIN 3.8   No results for input(s): LIPASE, AMYLASE in the last 168 hours. No results for input(s): AMMONIA in the last 168 hours. CBC:  Recent Labs Lab 06/09/17 1724 06/10/17 0528 06/11/17 0622  WBC 10.7* 11.0* 11.2*  NEUTROABS 8.4*  --   --   HGB 14.9 14.2 14.8  HCT 49.3* 48.4* 50.7*  MCV 89.6 90.3 89.7  PLT 70* 68* PLATELET CLUMPS NOTED ON SMEAR, COUNT APPEARS DECREASED   Cardiac Enzymes:  Recent Labs Lab 06/09/17 1724 06/10/17 0028 06/10/17 0528 06/10/17 1054  TROPONINI <0.03 <0.03 <0.03 <0.03   BNP: Invalid input(s): POCBNP CBG: No results for input(s): GLUCAP in the last 168 hours.  No results found for this or any previous visit (from the past 240 hour(s)).   Studies:              All Imaging reviewed and is as per above notation   Scheduled Meds: . carvedilol  3.125 mg Oral BID WC  . furosemide  80 mg Intravenous Q12H  . lisinopril  5 mg Oral Daily  . magnesium oxide  400 mg Oral Daily  . mouth rinse  15 mL Mouth Rinse BID  . potassium chloride SA  20 mEq Oral Daily   Continuous Infusions:   Assessment/Plan:  NYHA class 2-3 heart failure symptoms, acute Diastolic HF-EF 12-45% Habitus of obstructive hypoventilation syndrome Body mass index is 43.97 kg/m.   Has already diuresed 3 L  Her weight on discharge in 2016 was 73 kg. She is currently 98.2 kg  She has a way to go in terms of diuresis-for now continue 80 IV ii/dayLasix  lisinopril 5 has been added by cardiology as has coreg 3.125 bid  Cath deferred 2/2 to TTP  TTP lost to follow-up  We will cc Dr. Alvy Bimler as an Juluis [place]ier and will encourage patient to follow-up as an outpatient with her  PLT such stable in the 60-70 range and she is not having any bleeding of any kind  Medical noncompliance  Have encouraged the patient to take her meds as when necessary  Prior history serous cystadenoma status post resection 10/2007 Prior history incisional hernia repair status post exploratory  laparoscopy 2016 Primary history T12 vertebral compression fracture status post repair  Her back pain should be managed with non-OP    Inpatient Has a ways to go regarding diuresis to reach goal weight No family + today Will be here diruesing  Verneita Griffes, MD  [facility] Pager [phone] 06/11/2017, 10:19 AM    LOS: 2 days

## 2017-06-11 NOTE — Progress Notes (Signed)
Patient had 5 beats of VT. Pt asymptomatic. VS stable. MD on call Opyd notified. Will continue to monitor.  Corgan Mormile, RN

## 2017-06-11 NOTE — Progress Notes (Signed)
Ref: Evelyn Beals, NP   Subjective:  Feeling better. Thrombocytopenia continues. Risk of prolonged anti-platelet agents ( if angioplasty is needed ) use with bleeding complications is high in this patient even with Nplate use to boost platelet count and use of Bivalirudin during angioplasty if needed. Patient willing to continue medical therapy for now.   Objective:  Vital Signs in the last 24 hours: Temp:  [98 F (36.7 C)-99.2 F (37.3 C)] 98 F (36.7 C) (09/27 0748) Pulse Rate:  [80-97] 84 (09/27 0748) Cardiac Rhythm: Ventricular tachycardia (09/27 0205) Resp:  [18] 18 (09/27 0419) BP: (105-144)/(43-70) 140/55 (09/27 0748) SpO2:  [93 %-96 %] 96 % (09/27 0748) Weight:  [95.4 kg (210 lb 6.4 oz)] 95.4 kg (210 lb 6.4 oz) (09/27 0419)  Physical Exam: BP Readings from Last 1 Encounters:  06/11/17 (!) 140/55    Wt Readings from Last 1 Encounters:  06/11/17 95.4 kg (210 lb 6.4 oz)    Weight change: 18.3 kg (40 lb 6.4 oz) Body mass index is 43.97 kg/m. HEENT: [place]/AT, Eyes-Blue, PERL, EOMI, Conjunctiva-Pink, Sclera-Non-icteric Neck: No JVD, No bruit, Trachea midline. Lungs:  Clear, Bilateral. Cardiac:  Regular rhythm, normal S1 and S2, no S3. II/VI systolic murmur. Abdomen:  Soft, non-tender. BS present. Extremities:  1 + edema present. No cyanosis. No clubbing. Bilateral lower leg venous stasis dermatitis. CNS: AxOx3, Cranial nerves grossly intact, moves all 4 extremities.  Skin: Warm and dry.   Intake/Output from previous day: 09/26 0701 - 09/27 0700 In: 480 [P.O.:480] Out: 2101 [Urine:2100; Stool:1]    Lab Results: BMET    Component Value Date/Time   NA 140 06/11/2017 0622   NA 142 06/10/2017 0528   NA 137 06/09/2017 1724   NA 140 11/16/2013 1238   NA 142 09/29/2013 1500   NA 141 08/24/2013 1419   K 3.7 06/11/2017 0622   K 3.7 06/10/2017 0528   K 4.0 06/09/2017 1724   K 4.1 11/16/2013 1238   K 3.9 09/29/2013 1500   K 4.2 08/24/2013 1419   CL 93 (L)  06/11/2017 0622   CL 98 (L) 06/10/2017 0528   CL 103 06/09/2017 1724   CO2 36 (H) 06/11/2017 0622   CO2 33 (H) 06/10/2017 0528   CO2 28 06/09/2017 1724   CO2 24 11/16/2013 1238   CO2 26 09/29/2013 1500   CO2 23 08/24/2013 1419   GLUCOSE 98 06/11/2017 0622   GLUCOSE 88 06/10/2017 0528   GLUCOSE 98 06/09/2017 1724   GLUCOSE 114 11/16/2013 1238   GLUCOSE 132 09/29/2013 1500   GLUCOSE 103 08/24/2013 1419   BUN 9 06/11/2017 0622   BUN 9 06/10/2017 0528   BUN 11 06/09/2017 1724   BUN 12.1 11/16/2013 1238   BUN 10.9 09/29/2013 1500   BUN 13.5 08/24/2013 1419   CREATININE 0.72 06/11/2017 0622   CREATININE 0.79 06/10/2017 0528   CREATININE 0.72 06/09/2017 1724   CREATININE 0.7 11/16/2013 1238   CREATININE 0.8 09/29/2013 1500   CREATININE 0.7 08/24/2013 1419   CALCIUM 8.3 (L) 06/11/2017 0622   CALCIUM 8.2 (L) 06/10/2017 0528   CALCIUM 8.2 (L) 06/09/2017 1724   CALCIUM 9.7 11/16/2013 1238   CALCIUM 9.6 09/29/2013 1500   CALCIUM 9.6 08/24/2013 1419   GFRNONAA >60 06/11/2017 0622   GFRNONAA >60 06/10/2017 0528   GFRNONAA >60 06/09/2017 1724   GFRAA >60 06/11/2017 0622   GFRAA >60 06/10/2017 0528   GFRAA >60 06/09/2017 1724   CBC    Component Value Date/Time  WBC 11.2 (H) 06/11/2017 0622   RBC 5.65 (H) 06/11/2017 0622   HGB 14.8 06/11/2017 0622   HGB 13.2 04/10/2014 1315   HCT 50.7 (H) 06/11/2017 0622   HCT 40.7 04/10/2014 1315   PLT  06/11/2017 0622    PLATELET CLUMPS NOTED ON SMEAR, COUNT APPEARS DECREASED   PLT 109 (L) 04/10/2014 1315   MCV 89.7 06/11/2017 0622   MCV 85.3 04/10/2014 1315   MCH 26.2 06/11/2017 0622   MCHC 29.2 (L) 06/11/2017 0622   RDW 15.3 06/11/2017 0622   RDW 15.1 (H) 04/10/2014 1315   LYMPHSABS 1.4 06/09/2017 1724   LYMPHSABS 1.4 04/10/2014 1315   MONOABS 0.7 06/09/2017 1724   MONOABS 0.7 04/10/2014 1315   EOSABS 0.2 06/09/2017 1724   EOSABS 0.6 (H) 04/10/2014 1315   BASOSABS 0.0 06/09/2017 1724   BASOSABS 0.1 04/10/2014 1315   HEPATIC  Function Panel  Recent Labs  06/09/17 1724  PROT 5.8*   HEMOGLOBIN A1C No components found for: HGA1C,  MPG CARDIAC ENZYMES Lab Results  Component Value Date   TROPONINI <0.03 06/10/2017   TROPONINI <0.03 06/10/2017   TROPONINI <0.03 06/10/2017   BNP No results for input(s): PROBNP in the last 8760 hours. TSH  Recent Labs  06/10/17 0028  TSH 1.678   CHOLESTEROL No results for input(s): CHOL in the last 8760 hours.  Scheduled Meds: . carvedilol  3.125 mg Oral BID WC  . furosemide  80 mg Intravenous Q12H  . lisinopril  5 mg Oral Daily  . magnesium oxide  400 mg Oral Daily  . mouth rinse  15 mL Mouth Rinse BID  . potassium chloride SA  20 mEq Oral Daily   Continuous Infusions: PRN Meds:.acetaminophen **OR** acetaminophen, oxyCODONE-acetaminophen **AND** oxyCODONE, polyethylene glycol  Assessment/Plan: Acute diastolic heart failure Hypertrophic non-obstructive cardiomyopathy Morbid obesity Hypertension Idiopathic thrombocytopenia  Continue Ace-inhibitor and beta blocker as tolerated. Increase activity.   LOS: 2 days    Dixie Dials  MD  06/11/2017, 10:25 AM

## 2017-06-12 LAB — BASIC METABOLIC PANEL
Anion gap: 9 (ref 5–15)
BUN: 11 mg/dL (ref 6–20)
CO2: 36 mmol/L — ABNORMAL HIGH (ref 22–32)
Calcium: 8.3 mg/dL — ABNORMAL LOW (ref 8.9–10.3)
Chloride: 91 mmol/L — ABNORMAL LOW (ref 101–111)
Creatinine, Ser: 0.68 mg/dL (ref 0.44–1.00)
GFR calc Af Amer: >60 mL/min (ref >60)
GFR calc non Af Amer: >60 mL/min (ref >60)
Glucose, Bld: 88 mg/dL (ref 65–99)
Potassium: 3.5 mmol/L (ref 3.5–5.1)
Sodium: 136 mmol/L (ref 135–145)

## 2017-06-12 MED ORDER — DOXYCYCLINE HYCLATE 100 MG PO TABS
100.0000 mg | ORAL_TABLET | Freq: Two times a day (BID) | ORAL | Status: DC
  Administered 2017-06-12 – 2017-06-20 (×17): 100 mg via ORAL
  Filled 2017-06-12 (×17): qty 1

## 2017-06-12 MED ORDER — ZOLPIDEM TARTRATE 5 MG PO TABS
5.0000 mg | ORAL_TABLET | Freq: Every evening | ORAL | Status: DC | PRN
  Administered 2017-06-12 – 2017-06-13 (×2): 5 mg via ORAL
  Filled 2017-06-12 (×2): qty 1

## 2017-06-12 MED ORDER — POTASSIUM CHLORIDE CRYS ER 20 MEQ PO TBCR: 40.0000 meq | EXTENDED_RELEASE_TABLET | Freq: Two times a day (BID) | ORAL | Status: DC

## 2017-06-12 MED ORDER — POTASSIUM CHLORIDE 20 MEQ PO PACK
40.0000 meq | PACK | Freq: Two times a day (BID) | ORAL | Status: DC
  Administered 2017-06-12 – 2017-06-13 (×4): 40 meq via ORAL
  Filled 2017-06-12 (×7): qty 2

## 2017-06-12 NOTE — Progress Notes (Signed)
SATURATION QUALIFICATIONS: (This note is used to comply with regulatory documentation for home oxygen)  Patient Saturations on Room Air at Rest = 87%  Patient Saturations on Room Air while Ambulating = 84%  Patient Saturations on 2 Liters of oxygen while Ambulating = 90%  Please briefly explain why patient needs home oxygen:Patient is unable to maintain O2 sats 90% without supplemental O2.   Earney Navy, PTA Pager: [phone]

## 2017-06-12 NOTE — Progress Notes (Signed)
Pt A&OX4. This morning temp=99.1 HR=76. Pt has lower leg swelling and redness. WBC increased from and adm and yesterday. Hospitalist was made aware and started pt on doxycline PO. K=3.5. Hospitalist was made aware. Pt was medicated with potassium PO as ordered by hospitalist.

## 2017-06-12 NOTE — Progress Notes (Signed)
Patient requesting something for sleep.Paged MD on call. Awaiting on possible order.   Silvano Garofano, RN

## 2017-06-12 NOTE — Plan of Care (Signed)
Problem: Education: Goal: Ability to demonstrate managment of disease process will improve Outcome: Progressing Pt was educated on the importance of   Elevating  feet while sitting in chair to help decrease swelling. Pt followed instructed.

## 2017-06-12 NOTE — Progress Notes (Signed)
Physical Therapy Treatment Patient Details Name: [name] MRN: [id number] DOB: 08-29-1951 Today's Date: 06/12/2017    History of Present Illness [name] is a 66 y.o. female with history of hypertension chronic low back pain diopathic thrombocytopenia many purpura has not been to a physician for long time presents to the ER with complaints of worsening shortness of breath and increasing lower extremity edema.    PT Comments    Patient is making progress toward mobility goals. Pt tolerated increased gait distance with min guard/supervision and RW and 2 standing rest breaks due to 2/4 DOE and back pain. Pt with O2 desat on RA to 84%. See O2 qualification note. Continue to progress as tolerated.    Follow Up Recommendations  Home health PT     Equipment Recommendations  3in1 (PT) (rollator )    Recommendations for Other Services OT consult     Precautions / Restrictions Precautions Precautions: Fall Precaution Comments: Watch O2 sats Restrictions Weight Bearing Restrictions: No    Mobility  Bed Mobility               General bed mobility comments: pt OOB in recliner upon arrival--sleeps in recliner at home  Transfers Overall transfer level: Needs assistance Equipment used: None Transfers: Sit to/from Stand Sit to Stand: Supervision         General transfer comment: supervision for safety  Ambulation/Gait Ambulation/Gait assistance: Min guard;Supervision Ambulation Distance (Feet): 150 Feet Assistive device: Rolling walker (2 wheeled);None Gait Pattern/deviations: Decreased step length - right;Decreased step length - left;Trunk flexed;Step-through pattern Gait velocity: decreased   General Gait Details: cues for posture and pursed lip breathing technique; 2 standing rest breaks due to 2/4 DOE   Financial trader Rankin (Stroke Patients Only)       Balance                                             Cognition Arousal/Alertness: Awake/alert Behavior During Therapy: WFL for tasks assessed/performed Overall Cognitive Status: Within Functional Limits for tasks assessed                                        Exercises      General Comments General comments (skin integrity, edema, etc.): SpO2 desat to 84% on RA while ambulating and up to 92% at rest with 2L O2 via [place]      Pertinent Vitals/Pain Pain Assessment: Faces Faces Pain Scale: Hurts little more Pain Location: back Pain Descriptors / Indicators: Aching Pain Intervention(s): Limited activity within patient's tolerance;Monitored during session;Repositioned    Home Living                      Prior Function            PT Goals (current goals can now be found in the care plan section) Acute Rehab PT Goals Patient Stated Goal: Hopes to get home PT Goal Formulation: With patient Time For Goal Achievement: 06/24/17 Potential to Achieve Goals: Good Progress towards PT goals: Progressing toward goals    Frequency    Min 3X/week      PT Plan Current plan remains appropriate  Co-evaluation              AM-PAC PT "6 Clicks" Daily Activity  Outcome Measure  Difficulty turning over in bed (including adjusting bedclothes, sheets and blankets)?: A Lot Difficulty moving from lying on back to sitting on the side of the bed? : A Little Difficulty sitting down on and standing up from a chair with arms (e.g., wheelchair, bedside commode, etc,.)?: A Little Help needed moving to and from a bed to chair (including a wheelchair)?: A Little Help needed walking in hospital room?: A Little Help needed climbing 3-5 steps with a railing? : A Little 6 Click Score: 17    End of Session Equipment Utilized During Treatment: Oxygen;Gait belt Activity Tolerance: Patient tolerated treatment well Patient left: in chair;with call bell/phone within reach Nurse Communication: Mobility  status PT Visit Diagnosis: Unsteadiness on feet (R26.81);Other abnormalities of gait and mobility (R26.89)     Time: 1683-7290 PT Time Calculation (min) (ACUTE ONLY): 37 min  Charges:  $Gait Training: 8-22 mins $Therapeutic Activity: 8-22 mins                    G Codes:       Earney Navy, PTA Pager: [phone]     Darliss Cheney 06/12/2017, 10:31 AM

## 2017-06-12 NOTE — Progress Notes (Signed)
[name] [id number]:[id number] DOB: [birth year] DOA: 06/09/2017 PCP: [name], NP  Brief narrative:  66 year old ? Hypertension Known chronic back pain Idiopathic thrombocytopenic purpura followed by Dr. Alvy Bimler Prior incisional hernia with obstruction status post exploratory laparoscopy 11/17/2014 Prior T12 compression fracture Prior large adnexal mass? 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  Admitted overnight 9/25 with subacute shortness of breath Appears to run out of multiple hypertensive agentsAnd has not been compliant for while  Cardiology consulted echocardiogram done, cardiac cath contemplated that because of ITP probably not a good idea Medical management diuresis  Past medical history-As per Problem list Chart reviewed as below- reviewed  Consultants:  Cardiology  Procedures:    Antibiotics:  Oxycycline 9/28   Subjective   Doing well Some cramping in her lower extremities Also having some redness and discomfort and low-grade temp   Objective    Interim History:   Telemetry: none   Objective: Vitals:   06/12/17 0434 06/12/17 0900 06/12/17 0914 06/12/17 1239  BP: 137/72 130/74 130/74 119/69  Pulse: 80   77  Resp: 20   18  Temp: 98.6 F (37 C) 99.1 F (37.3 C)  98.6 F (37 C)  TempSrc: Oral   Oral  SpO2: 98%   94%  Weight: 96.1 kg (211 lb 14.4 oz)     Height:        Intake/Output Summary (Last 24 hours) at 06/12/17 1629 Last data filed at 06/12/17 1400  Gross per 24 hour  Intake              502 ml  Output              900 ml  Net             -398 ml    Exam:  General: pleasant alert Cardiovascular: S1 and S2 HSM no jvd Respiratory: cta bi Abdomen: obese nontender nondistended Skin: lower extremity edema 3+ still--left lower extremity shows significant erythema as well as some small blebs When compared to the right lower extremity Neurointact alert oriented power 5/5 grossly smile  symmetric  Data Reviewed: Basic Metabolic Panel:  Recent Labs Lab 06/09/17 1724 06/10/17 0028 06/10/17 0528 06/11/17 0622 06/12/17 0345  NA 137  --  142 140 136  K 4.0  --  3.7 3.7 3.5  CL 103  --  98* 93* 91*  CO2 28  --  33* 36* 36*  GLUCOSE 98  --  88 98 88  BUN 11  --  9 9 11   CREATININE 0.72  --  0.79 0.72 0.68  CALCIUM 8.2*  --  8.2* 8.3* 8.3*  MG  --  1.7  --   --   --    Liver Function Tests:  Recent Labs Lab 06/09/17 1724  AST 16  ALT 22  ALKPHOS 62  BILITOT 0.8  PROT 5.8*  ALBUMIN 3.8   No results for input(s): LIPASE, AMYLASE in the last 168 hours. No results for input(s): AMMONIA in the last 168 hours. CBC:  Recent Labs Lab 06/09/17 1724 06/10/17 0528 06/11/17 0622  WBC 10.7* 11.0* 11.2*  NEUTROABS 8.4*  --   --   HGB 14.9 14.2 14.8  HCT 49.3* 48.4* 50.7*  MCV 89.6 90.3 89.7  PLT 70* 68* PLATELET CLUMPS NOTED ON SMEAR, COUNT APPEARS DECREASED   Cardiac Enzymes:  Recent Labs Lab 06/09/17 1724 06/10/17 0028 06/10/17 0528 06/10/17 1054  TROPONINI <0.03 <0.03 <0.03 <0.03   BNP: Invalid input(s): POCBNP  CBG: No results for input(s): GLUCAP in the last 168 hours.  No results found for this or any previous visit (from the past 240 hour(s)).   Studies:              All Imaging reviewed and is as per above notation   Scheduled Meds: . carvedilol  3.125 mg Oral BID WC  . doxycycline  100 mg Oral Q12H  . furosemide  80 mg Intravenous Q12H  . lisinopril  5 mg Oral Daily  . magnesium oxide  400 mg Oral Daily  . mouth rinse  15 mL Mouth Rinse BID  . potassium chloride  40 mEq Oral BID   Continuous Infusions:   Assessment/Plan:  NYHA class 2-3 heart failure symptoms, acute Diastolic HF-EF 55-20% Habitus of obstructive hypoventilation syndrome Body mass index is 44.29 kg/m.   -4.2 L  Her weight on discharge in 2016 was 73 kg. She is currently 98.2 kg-->96 kg  She has a way to go in terms of diuresis-for now continue 80 IV  ii/dayLasix  lisinopril 5 has been added by cardiology as has coreg 3.125 bid  Cath deferred 2/2 to TTP  Nutritionist has seen the patient for knowledge deficits regarding salt, water and heart failure appreciate input  Probable left lower extremity cellulitis  It does not appear purulent at this time, start doxycycline 100 twice a day  Place Unna boots  We will reassess probably in 48 hours  TTP lost to follow-up  We will cc Dr. Alvy Bimler as an Juluis Rainier and will encourage patient to follow-up as an outpatient with her  PLT clumping on the last labs so we will repeat 9/29  Medical noncompliance  Have encouraged the patient to take her meds as when necessary  Prior history serous cystadenoma status post resection 10/2007 Prior history incisional hernia repair status post exploratory laparoscopy 2016 Primary history T12 vertebral compression fracture status post repair  Her back pain should be managed with non-opioid modalities  Inpatient Has a ways to go regarding diuresis to reach goal weight Will be here diruesing  Verneita Griffes, MD  [facility] Pager [phone] 06/12/2017, 4:29 PM    LOS: 3 days

## 2017-06-12 NOTE — Progress Notes (Signed)
Orthopedic Tech Progress Note Patient Details:  [name] 06/13/1951 [id number]  Ortho Devices Type of Ortho Device: Haematologist Ortho Device/Splint Location: applied unna boots to pt left and right lower legs (bilateral).  pt tolerated application very well.  bilateral.   Ortho Device/Splint Interventions: Application   Kristopher Oppenheim 06/12/2017, 4:51 PM

## 2017-06-12 NOTE — Progress Notes (Signed)
Ref: [name], NP   Subjective:  Feeling better. Decreasing leg edema.  Objective:  Vital Signs in the last 24 hours: Temp:  [98.4 F (36.9 C)-99.1 F (37.3 C)] 98.6 F (37 C) (09/28 1239) Pulse Rate:  [77-80] 77 (09/28 1239) Cardiac Rhythm: Normal sinus rhythm (09/28 0700) Resp:  [18-20] 18 (09/28 1239) BP: (119-137)/(40-74) 119/69 (09/28 1239) SpO2:  [94 %-98 %] 94 % (09/28 1239) Weight:  [96.1 kg (211 lb 14.4 oz)] 96.1 kg (211 lb 14.4 oz) (09/28 0434)  Physical Exam: BP Readings from Last 1 Encounters:  06/12/17 119/69    Wt Readings from Last 1 Encounters:  06/12/17 96.1 kg (211 lb 14.4 oz)    Weight change: 0.68 kg (1 lb 8 oz) Body mass index is 44.29 kg/m. HEENT: [place]/AT, Eyes-Blue, PERL, EOMI, Conjunctiva-Pink, Sclera-Non-icteric Neck: No JVD, No bruit, Trachea midline. Lungs:  Clear, Bilateral. Cardiac:  Regular rhythm, normal S1 and S2, no S3. II/VI systolic murmur. Abdomen:  Soft, non-tender. BS present. Extremities:  1 + edema present. No cyanosis. No clubbing. Venous stasis dermatitis as before. CNS: AxOx3, Cranial nerves grossly intact, moves all 4 extremities.  Skin: Warm and dry.   Intake/Output from previous day: 09/27 0701 - 09/28 0700 In: 598 [P.O.:598] Out: 1750 [Urine:1750]    Lab Results: BMET    Component Value Date/Time   NA 136 06/12/2017 0345   NA 140 06/11/2017 0622   NA 142 06/10/2017 0528   NA 140 11/16/2013 1238   NA 142 09/29/2013 1500   NA 141 08/24/2013 1419   K 3.5 06/12/2017 0345   K 3.7 06/11/2017 0622   K 3.7 06/10/2017 0528   K 4.1 11/16/2013 1238   K 3.9 09/29/2013 1500   K 4.2 08/24/2013 1419   CL 91 (L) 06/12/2017 0345   CL 93 (L) 06/11/2017 0622   CL 98 (L) 06/10/2017 0528   CO2 36 (H) 06/12/2017 0345   CO2 36 (H) 06/11/2017 0622   CO2 33 (H) 06/10/2017 0528   CO2 24 11/16/2013 1238   CO2 26 09/29/2013 1500   CO2 23 08/24/2013 1419   GLUCOSE 88 06/12/2017 0345   GLUCOSE 98 06/11/2017 0622   GLUCOSE  88 06/10/2017 0528   GLUCOSE 114 11/16/2013 1238   GLUCOSE 132 09/29/2013 1500   GLUCOSE 103 08/24/2013 1419   BUN 11 06/12/2017 0345   BUN 9 06/11/2017 0622   BUN 9 06/10/2017 0528   BUN 12.1 11/16/2013 1238   BUN 10.9 09/29/2013 1500   BUN 13.5 08/24/2013 1419   CREATININE 0.68 06/12/2017 0345   CREATININE 0.72 06/11/2017 0622   CREATININE 0.79 06/10/2017 0528   CREATININE 0.7 11/16/2013 1238   CREATININE 0.8 09/29/2013 1500   CREATININE 0.7 08/24/2013 1419   CALCIUM 8.3 (L) 06/12/2017 0345   CALCIUM 8.3 (L) 06/11/2017 0622   CALCIUM 8.2 (L) 06/10/2017 0528   CALCIUM 9.7 11/16/2013 1238   CALCIUM 9.6 09/29/2013 1500   CALCIUM 9.6 08/24/2013 1419   GFRNONAA >60 06/12/2017 0345   GFRNONAA >60 06/11/2017 0622   GFRNONAA >60 06/10/2017 0528   GFRAA >60 06/12/2017 0345   GFRAA >60 06/11/2017 0622   GFRAA >60 06/10/2017 0528   CBC    Component Value Date/Time   WBC 11.2 (H) 06/11/2017 0622   RBC 5.65 (H) 06/11/2017 0622   HGB 14.8 06/11/2017 0622   HGB 13.2 04/10/2014 1315   HCT 50.7 (H) 06/11/2017 0622   HCT 40.7 04/10/2014 1315   PLT  06/11/2017 0622  PLATELET CLUMPS NOTED ON SMEAR, COUNT APPEARS DECREASED   PLT 109 (L) 04/10/2014 1315   MCV 89.7 06/11/2017 0622   MCV 85.3 04/10/2014 1315   MCH 26.2 06/11/2017 0622   MCHC 29.2 (L) 06/11/2017 0622   RDW 15.3 06/11/2017 0622   RDW 15.1 (H) 04/10/2014 1315   LYMPHSABS 1.4 06/09/2017 1724   LYMPHSABS 1.4 04/10/2014 1315   MONOABS 0.7 06/09/2017 1724   MONOABS 0.7 04/10/2014 1315   EOSABS 0.2 06/09/2017 1724   EOSABS 0.6 (H) 04/10/2014 1315   BASOSABS 0.0 06/09/2017 1724   BASOSABS 0.1 04/10/2014 1315   HEPATIC Function Panel  Recent Labs  06/09/17 1724  PROT 5.8*   HEMOGLOBIN A1C No components found for: HGA1C,  MPG CARDIAC ENZYMES Lab Results  Component Value Date   TROPONINI <0.03 06/10/2017   TROPONINI <0.03 06/10/2017   TROPONINI <0.03 06/10/2017   BNP No results for input(s): PROBNP in the  last 8760 hours. TSH  Recent Labs  06/10/17 0028  TSH 1.678   CHOLESTEROL No results for input(s): CHOL in the last 8760 hours.  Scheduled Meds: . carvedilol  3.125 mg Oral BID WC  . doxycycline  100 mg Oral Q12H  . furosemide  80 mg Intravenous Q12H  . lisinopril  5 mg Oral Daily  . magnesium oxide  400 mg Oral Daily  . mouth rinse  15 mL Mouth Rinse BID  . potassium chloride  40 mEq Oral BID   Continuous Infusions: PRN Meds:.acetaminophen **OR** acetaminophen, oxyCODONE-acetaminophen **AND** oxyCODONE, polyethylene glycol, zolpidem  Assessment/Plan: Acute diastolic heart failure Hypertrophic non-obstructive cardiomyopathy Morbid obesity Hypertension Idiopathic thrombocytopenia  Continue diuresis as tolerated. F/U in 1 week. Re-consult as needed.   LOS: 3 days    Dixie Dials  MD  06/12/2017, 6:06 PM

## 2017-06-13 LAB — COMPREHENSIVE METABOLIC PANEL
ALT: 13 U/L — ABNORMAL LOW (ref 14–54)
AST: 13 U/L — ABNORMAL LOW (ref 15–41)
Albumin: 3.6 g/dL (ref 3.5–5.0)
Alkaline Phosphatase: 59 U/L (ref 38–126)
Anion gap: 8 (ref 5–15)
BUN: 15 mg/dL (ref 6–20)
CO2: 37 mmol/L — ABNORMAL HIGH (ref 22–32)
Calcium: 8.5 mg/dL — ABNORMAL LOW (ref 8.9–10.3)
Chloride: 92 mmol/L — ABNORMAL LOW (ref 101–111)
Creatinine, Ser: 0.71 mg/dL (ref 0.44–1.00)
GFR calc Af Amer: >60 mL/min (ref >60)
GFR calc non Af Amer: >60 mL/min (ref >60)
Glucose, Bld: 93 mg/dL (ref 65–99)
Potassium: 4.1 mmol/L (ref 3.5–5.1)
Sodium: 137 mmol/L (ref 135–145)
Total Bilirubin: 1.1 mg/dL (ref 0.3–1.2)
Total Protein: 5.9 g/dL — ABNORMAL LOW (ref 6.5–8.1)

## 2017-06-13 LAB — CBC WITH DIFFERENTIAL/PLATELET
Basophils Absolute: 0 10*3/uL (ref 0.0–0.1)
Basophils Relative: 0 %
Eosinophils Absolute: 0.6 10*3/uL (ref 0.0–0.7)
Eosinophils Relative: 5 %
HCT: 49.5 % — ABNORMAL HIGH (ref 36.0–46.0)
Hemoglobin: 15.2 g/dL — ABNORMAL HIGH (ref 12.0–15.0)
Lymphocytes Relative: 15 %
Lymphs Abs: 1.8 10*3/uL (ref 0.7–4.0)
MCH: 27.1 pg (ref 26.0–34.0)
MCHC: 30.7 g/dL (ref 30.0–36.0)
MCV: 88.4 fL (ref 78.0–100.0)
Monocytes Absolute: 1.2 10*3/uL — ABNORMAL HIGH (ref 0.1–1.0)
Monocytes Relative: 10 %
Neutro Abs: 8.4 10*3/uL — ABNORMAL HIGH (ref 1.7–7.7)
Neutrophils Relative %: 70 %
Platelets: 58 10*3/uL — ABNORMAL LOW (ref 150–400)
RBC: 5.6 MIL/uL — ABNORMAL HIGH (ref 3.87–5.11)
RDW: 15 % (ref 11.5–15.5)
WBC: 12 10*3/uL — ABNORMAL HIGH (ref 4.0–10.5)

## 2017-06-13 MED ORDER — SACCHAROMYCES BOULARDII 250 MG PO CAPS
250.0000 mg | ORAL_CAPSULE | Freq: Two times a day (BID) | ORAL | Status: DC
  Administered 2017-06-13 – 2017-06-20 (×15): 250 mg via ORAL
  Filled 2017-06-13 (×15): qty 1

## 2017-06-13 MED ORDER — ZOLPIDEM TARTRATE 5 MG PO TABS
5.0000 mg | ORAL_TABLET | Freq: Every evening | ORAL | Status: DC | PRN
  Administered 2017-06-14 – 2017-06-19 (×7): 5 mg via ORAL
  Filled 2017-06-13 (×7): qty 1

## 2017-06-13 NOTE — Progress Notes (Signed)
PROGRESS NOTE    [name]  GGY:[id number] DOB: [birth year] DOA: 06/09/2017 PCP: [name], NP    Brief Narrative: 66 year old female admitted with shortness of breath and increasing lower extremity edema. At the time of admission her BNP was 477 she was given IV Lasix and admitted for new onset congestive heart failure. She has a history of idiopathic thrombocytopenic purpura. She hasn't followed up with a doctor in a long time. She was started on Lasix 80 mg twice a day and ace inhibitor and beta blocker. She was started on doxycycline for right lower extremity cellulitis. Today she is sitting up and eating her breakfast on oxygen in no acute distress. She feels her breathing is getting better however she still has a lot of lower extremity edema. She reports that she just does not have oxygen at home. She lives at at home with her daughter.   Assessment & Plan:   Principal Problem:   Acute congestive heart failure (HCC) Active Problems:   HTN (hypertension)   Idiopathic thrombocytopenic purpura (HCC)   Tobacco abuse   CHF (congestive heart failure) ([place]) New onset diastolic CHF ejection fraction 60-65%. Hypertrophic NON obstructive cardiomyopathy Hypertension patient noncompliant to medications at home ITP platelet count today is 58 down from 68. Right lower extremity cellulitis started doxycycline 06/12/2017.  Plan is to continue Lasix 80 mg twice a day for now. Replete potassium. Continue carvedilol and ACE inhibitor. Patient was to have a cardiac cath but has been deferred due to ITP/thrombocytopenia. We will monitor BMP and CBC tomorrow to follow-up renal functions on diuretics as well as follow-up platelet count. Patient had a venous Doppler of the lower extremities which was negative for blood clots.   DVT  prophylaxis: stockings Code Status: full Family Communication: none Disposition Plan:  tbd  Consultants:  cardiology   Procedures: none    Antimicrobials: doxycycline    Subjective:  feels better   Objective: Vitals:   06/12/17 1239 06/12/17 2154 06/13/17 0454 06/13/17 0930  BP: 119/69 (!) 141/57 (!) 139/59 128/61  Pulse: 77 80 83 85  Resp: 18 18 20    Temp: 98.6 F (37 C) 98.4 F (36.9 C) 98.4 F (36.9 C)   TempSrc: Oral Oral Oral   SpO2: 94% 97% 100%   Weight:   94.5 kg (208 lb 6.4 oz)   Height:        Intake/Output Summary (Last 24 hours) at 06/13/17 1220 Last data filed at 06/13/17 0950  Gross per 24 hour  Intake              704 ml  Output             2750 ml  Net            -2046 ml   Filed Weights   06/11/17 0419 06/12/17 0434 06/13/17 0454  Weight: 95.4 kg (210 lb 6.4 oz) 96.1 kg (211 lb 14.4 oz) 94.5 kg (208 lb 6.4 oz)    Examination:  General exam: Appears calm and comfortable  Respiratory system: Clear to auscultation. Respiratory effort normal. Cardiovascular system: S1 & S2 heard, RRR. No JVD, murmurs, rubs, gallops or clicks. No pedal edema. Gastrointestinal system: Abdomen is nondistended, soft and nontender. No organomegaly or masses felt. Normal bowel sounds heard. Central nervous system: Alert and oriented. No focal neurological deficits. Extremities: Symmetric 5 x 5 power. Skin: No rashes, lesions or ulcers Psychiatry: Judgement and insight appear normal. Mood & affect appropriate.  Data Reviewed: I have personally reviewed following labs and imaging studies  CBC:  Recent Labs Lab 06/09/17 1724 06/10/17 0528 06/11/17 0622 06/13/17 0424  WBC 10.7* 11.0* 11.2* 12.0*  NEUTROABS 8.4*  --   --  8.4*  HGB 14.9 14.2 14.8 15.2*  HCT 49.3* 48.4* 50.7* 49.5*  MCV 89.6 90.3 89.7 88.4  PLT 70* 68* PLATELET CLUMPS NOTED ON SMEAR, COUNT APPEARS DECREASED 58*   Basic Metabolic Panel:  Recent Labs Lab 06/09/17 1724 06/10/17 0028 06/10/17 0528 06/11/17 0622 06/12/17 0345 06/13/17 0424  NA 137  --  142 140 136 137  K 4.0  --  3.7 3.7 3.5 4.1  CL 103  --  98* 93* 91* 92*   CO2 28  --  33* 36* 36* 37*  GLUCOSE 98  --  88 98 88 93  BUN 11  --  9 9 11 15   CREATININE 0.72  --  0.79 0.72 0.68 0.71  CALCIUM 8.2*  --  8.2* 8.3* 8.3* 8.5*  MG  --  1.7  --   --   --   --    GFR: Estimated Creatinine Clearance: 69.9 mL/min (by C-G formula based on SCr of 0.71 mg/dL). Liver Function Tests:  Recent Labs Lab 06/09/17 1724 06/13/17 0424  AST 16 13*  ALT 22 13*  ALKPHOS 62 59  BILITOT 0.8 1.1  PROT 5.8* 5.9*  ALBUMIN 3.8 3.6   No results for input(s): LIPASE, AMYLASE in the last 168 hours. No results for input(s): AMMONIA in the last 168 hours. Coagulation Profile: No results for input(s): INR, PROTIME in the last 168 hours. Cardiac Enzymes:  Recent Labs Lab 06/09/17 1724 06/10/17 0028 06/10/17 0528 06/10/17 1054  TROPONINI <0.03 <0.03 <0.03 <0.03   BNP (last 3 results) No results for input(s): PROBNP in the last 8760 hours. HbA1C: No results for input(s): HGBA1C in the last 72 hours. CBG: No results for input(s): GLUCAP in the last 168 hours. Lipid Profile: No results for input(s): CHOL, HDL, LDLCALC, TRIG, CHOLHDL, LDLDIRECT in the last 72 hours. Thyroid Function Tests: No results for input(s): TSH, T4TOTAL, FREET4, T3FREE, THYROIDAB in the last 72 hours. Anemia Panel: No results for input(s): VITAMINB12, FOLATE, FERRITIN, TIBC, IRON, RETICCTPCT in the last 72 hours. Sepsis Labs: No results for input(s): PROCALCITON, LATICACIDVEN in the last 168 hours.  No results found for this or any previous visit (from the past 240 hour(s)).       Radiology Studies: No results found.      Scheduled Meds: . carvedilol  3.125 mg Oral BID WC  . doxycycline  100 mg Oral Q12H  . furosemide  80 mg Intravenous Q12H  . lisinopril  5 mg Oral Daily  . magnesium oxide  400 mg Oral Daily  . mouth rinse  15 mL Mouth Rinse BID  . potassium chloride  40 mEq Oral BID   Continuous Infusions:   LOS: 4 days     Lum Keas D  If 7PM-7AM,  please contact night-coverage [email or web address] Password [email or web address] 06/13/2017, 12:20 PM

## 2017-06-14 MED ORDER — POTASSIUM CHLORIDE CRYS ER 20 MEQ PO TBCR
40.0000 meq | EXTENDED_RELEASE_TABLET | Freq: Two times a day (BID) | ORAL | Status: DC
  Administered 2017-06-14 – 2017-06-15 (×3): 40 meq via ORAL
  Filled 2017-06-14 (×3): qty 2

## 2017-06-14 MED ORDER — CARVEDILOL 6.25 MG PO TABS
6.2500 mg | ORAL_TABLET | Freq: Two times a day (BID) | ORAL | Status: DC
  Administered 2017-06-14 – 2017-06-20 (×12): 6.25 mg via ORAL
  Filled 2017-06-14 (×12): qty 1

## 2017-06-14 NOTE — Progress Notes (Signed)
PROGRESS NOTE    [name]  STM:[id number] DOB: [birth year] DOA: 06/09/2017 PCP: [name], NP    Brief Narrative:  66 year old female admitted with shortness of breath and increasing lower extremity edema. At the time of admission her BNP was 477 she was given IV Lasix and admitted for new onset congestive heart failure. She has a history of idiopathic thrombocytopenic purpura. She hasn't followed up with a doctor in a long time. She was started on Lasix 80 mg twice a day and ace inhibitor and beta blocker. She was started on doxycycline for right lower extremity cellulitis. Today she is sitting up and eating her breakfast on oxygen in no acute distress. She feels her breathing is getting better however she still has a lot of lower extremity edema. She reports that she just does not have oxygen at home. She lives at at home with her daughter.    Assessment & Plan:   Principal Problem:   Acute congestive heart failure (HCC) Active Problems:   HTN (hypertension)   Idiopathic thrombocytopenic purpura (HCC)   Tobacco abuse   CHF (congestive heart failure) (HCC)  ACUTE on CHRONIC DIASTOLIC CHF continue lasix iv,coreg and zestril.change iv to po lasix in 24 48 hours if she continues to do well. CELLULITIS RIGHT LOWER EXTREMITY on doxycycline. ITP fu with hematology as out partient HTN increase coreg for better control of bp. HNCMP continue cardiac meds. Fu with cardio after dc.  DVT prophylaxis:STOCKINGS Code Status: FULL Family Commnication: NONE Disposition PlAN  tbd   Consultants: CARDIOLOGY  Procedures NONE Antimicrobials: NONE   Subjective:feels better.  Objective: Vitals:   06/13/17 1300 06/13/17 2158 06/14/17 0628 06/14/17 0647  BP: 137/71 (!) 153/67 (!) 158/75   Pulse: 79 75 83   Resp: 20 18 20    Temp: 98.1 F (36.7 C) 99 F (37.2 C) 98.8 F (37.1 C)   TempSrc: Oral Oral Oral   SpO2: 97% 98%    Weight:    94.4 kg (208 lb 3.2 oz)  Height:          Intake/Output Summary (Last 24 hours) at 06/14/17 1116 Last data filed at 06/14/17 0920  Gross per 24 hour  Intake              820 ml  Output             1926 ml  Net            -1106 ml   Filed Weights   06/12/17 0434 06/13/17 0454 06/14/17 0647  Weight: 96.1 kg (211 lb 14.4 oz) 94.5 kg (208 lb 6.4 oz) 94.4 kg (208 lb 3.2 oz)    Examination:  General exam: Appears calm and comfortable  Respiratory system: CRACKLES AT BASES auscultation. Respiratory effort normal. Cardiovascular system: S1 & S2 heard, RRR. No JVD, murmurs, rubs, gallops or clicks. No pedal edema. Gastrointestinal system: Abdomen is nondistended, soft and nontender. No organomegaly or masses felt. Normal bowel sounds heard. Central nervous system: Alert and oriented. No focal neurological deficits. Extremities: Symmetric 5 x 5 power. Skin: No rashes, lesions or ulcers Psychiatry: Judgement and insight appear normal. Mood & affect appropriate.     Data Reviewed: I have personally reviewed following labs and imaging studies  CBC:  Recent Labs Lab 06/09/17 1724 06/10/17 0528 06/11/17 0622 06/13/17 0424  WBC 10.7* 11.0* 11.2* 12.0*  NEUTROABS 8.4*  --   --  8.4*  HGB 14.9 14.2 14.8 15.2*  HCT 49.3* 48.4* 50.7* 49.5*  MCV 89.6 90.3 89.7 88.4  PLT 70* 68* PLATELET CLUMPS NOTED ON SMEAR, COUNT APPEARS DECREASED 58*   Basic Metabolic Panel:  Recent Labs Lab 06/09/17 1724 06/10/17 0028 06/10/17 0528 06/11/17 0622 06/12/17 0345 06/13/17 0424  NA 137  --  142 140 136 137  K 4.0  --  3.7 3.7 3.5 4.1  CL 103  --  98* 93* 91* 92*  CO2 28  --  33* 36* 36* 37*  GLUCOSE 98  --  88 98 88 93  BUN 11  --  9 9 11 15   CREATININE 0.72  --  0.79 0.72 0.68 0.71  CALCIUM 8.2*  --  8.2* 8.3* 8.3* 8.5*  MG  --  1.7  --   --   --   --    GFR: Estimated Creatinine Clearance: 69.9 mL/min (by C-G formula based on SCr of 0.71 mg/dL). Liver Function Tests:  Recent Labs Lab 06/09/17 1724 06/13/17 0424  AST 16  13*  ALT 22 13*  ALKPHOS 62 59  BILITOT 0.8 1.1  PROT 5.8* 5.9*  ALBUMIN 3.8 3.6   No results for input(s): LIPASE, AMYLASE in the last 168 hours. No results for input(s): AMMONIA in the last 168 hours. Coagulation Profile: No results for input(s): INR, PROTIME in the last 168 hours. Cardiac Enzymes:  Recent Labs Lab 06/09/17 1724 06/10/17 0028 06/10/17 0528 06/10/17 1054  TROPONINI <0.03 <0.03 <0.03 <0.03   BNP (last 3 results) No results for input(s): PROBNP in the last 8760 hours. HbA1C: No results for input(s): HGBA1C in the last 72 hours. CBG: No results for input(s): GLUCAP in the last 168 hours. Lipid Profile: No results for input(s): CHOL, HDL, LDLCALC, TRIG, CHOLHDL, LDLDIRECT in the last 72 hours. Thyroid Function Tests: No results for input(s): TSH, T4TOTAL, FREET4, T3FREE, THYROIDAB in the last 72 hours. Anemia Panel: No results for input(s): VITAMINB12, FOLATE, FERRITIN, TIBC, IRON, RETICCTPCT in the last 72 hours. Sepsis Labs: No results for input(s): PROCALCITON, LATICACIDVEN in the last 168 hours.  No results found for this or any previous visit (from the past 240 hour(s)).       Radiology Studies: No results found.      Scheduled Meds: . carvedilol  3.125 mg Oral BID WC  . doxycycline  100 mg Oral Q12H  . furosemide  80 mg Intravenous Q12H  . lisinopril  5 mg Oral Daily  . magnesium oxide  400 mg Oral Daily  . mouth rinse  15 mL Mouth Rinse BID  . potassium chloride SA  40 mEq Oral BID  . saccharomyces boulardii  250 mg Oral BID   Continuous Infusions:   LOS: 5 days        Georgette Shell, MD [facility]  If 7PM-7AM, please contact night-coverage [email or web address] Password [email or web address] 06/14/2017, 11:16 AM

## 2017-06-15 LAB — BASIC METABOLIC PANEL
Anion gap: 8 (ref 5–15)
Anion gap: 6 (ref 5–15)
BUN: 20 mg/dL (ref 6–20)
BUN: 21 mg/dL — ABNORMAL HIGH (ref 6–20)
CO2: 32 mmol/L (ref 22–32)
CO2: 35 mmol/L — ABNORMAL HIGH (ref 22–32)
Calcium: 8.9 mg/dL (ref 8.9–10.3)
Calcium: 9 mg/dL (ref 8.9–10.3)
Chloride: 93 mmol/L — ABNORMAL LOW (ref 101–111)
Chloride: 93 mmol/L — ABNORMAL LOW (ref 101–111)
Creatinine, Ser: 0.69 mg/dL (ref 0.44–1.00)
Creatinine, Ser: 0.74 mg/dL (ref 0.44–1.00)
GFR calc Af Amer: >60 mL/min (ref >60)
GFR calc Af Amer: >60 mL/min (ref >60)
GFR calc non Af Amer: >60 mL/min (ref >60)
GFR calc non Af Amer: >60 mL/min (ref >60)
Glucose, Bld: 116 mg/dL — ABNORMAL HIGH (ref 65–99)
Glucose, Bld: 122 mg/dL — ABNORMAL HIGH (ref 65–99)
Potassium: 5.3 mmol/L — ABNORMAL HIGH (ref 3.5–5.1)
Potassium: 5.4 mmol/L — ABNORMAL HIGH (ref 3.5–5.1)
Sodium: 133 mmol/L — ABNORMAL LOW (ref 135–145)
Sodium: 134 mmol/L — ABNORMAL LOW (ref 135–145)

## 2017-06-15 LAB — CBC WITH DIFFERENTIAL/PLATELET
Basophils Absolute: 0 10*3/uL (ref 0.0–0.1)
Basophils Relative: 0 %
Eosinophils Absolute: 0.5 10*3/uL (ref 0.0–0.7)
Eosinophils Relative: 4 %
HCT: 49.5 % — ABNORMAL HIGH (ref 36.0–46.0)
Hemoglobin: 14.8 g/dL (ref 12.0–15.0)
Lymphocytes Relative: 19 %
Lymphs Abs: 2.1 10*3/uL (ref 0.7–4.0)
MCH: 26.4 pg (ref 26.0–34.0)
MCHC: 29.9 g/dL — ABNORMAL LOW (ref 30.0–36.0)
MCV: 88.2 fL (ref 78.0–100.0)
Monocytes Absolute: 1 10*3/uL (ref 0.1–1.0)
Monocytes Relative: 9 %
Neutro Abs: 7.7 10*3/uL (ref 1.7–7.7)
Neutrophils Relative %: 68 %
Platelets: 51 10*3/uL — ABNORMAL LOW (ref 150–400)
RBC: 5.61 MIL/uL — ABNORMAL HIGH (ref 3.87–5.11)
RDW: 15.2 % (ref 11.5–15.5)
WBC: 11.3 10*3/uL — ABNORMAL HIGH (ref 4.0–10.5)

## 2017-06-15 NOTE — Progress Notes (Signed)
[name]:[id number] DOB: Jan 20, 1951 DOA: 06/09/2017 PCP: [name], NP  Brief narrative:  66 year old ? Hypertension Known chronic back pain Idiopathic thrombocytopenic purpura followed by Dr. Alvy Bimler Prior incisional hernia with obstruction status post exploratory laparoscopy 11/17/2014 Prior T12 compression fracture Prior large adnexal mass? 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  Admitted overnight 9/25 with subacute shortness of breath Appears to run out of multiple hypertensive agentsAnd has not been compliant for while  Cardiology consulted echocardiogram done, cardiac cath contemplated that because of ITP probably not a good idea Medical management diuresis  Past medical history-As per Problem list Chart reviewed as below- reviewed  Consultants:  Cardiology  Procedures:    Antibiotics:  Oxycycline 9/28   Subjective   Overall as well breathing better needed oxygen on ambulation but otherwise has improved  Therapy is recommended home health with a Rollator   Objective    Interim History:   Telemetry: none   Objective: Vitals:   06/15/17 1004 06/15/17 1248 06/15/17 1600 06/15/17 1609  BP: (!) 121/47 (!) 124/58    Pulse: 74 73    Resp:  18    Temp:  98.1 F (36.7 C)    TempSrc:  Oral    SpO2: 96% 96% 97% 94%  Weight:      Height:        Intake/Output Summary (Last 24 hours) at 06/15/17 1625 Last data filed at 06/15/17 1601  Gross per 24 hour  Intake             1520 ml  Output             2976 ml  Net            -1456 ml    Exam:  Obese pleasant oriented no distress S1-S2 no murmur rub or gallop Chest clinically clear without added sound Abdomen soft nontender no rebound no guarding Neurologically intact without added sound Lower extremity wrapped with Unna boots  Data Reviewed: Basic Metabolic Panel:  Recent Labs Lab 06/10/17 0028  06/11/17 0622 06/12/17 0345 06/13/17 0424  06/15/17 0800 06/15/17 1247  NA  --   < > 140 136 137 133* 134*  K  --   < > 3.7 3.5 4.1 5.3* 5.4*  CL  --   < > 93* 91* 92* 93* 93*  CO2  --   < > 36* 36* 37* 32 35*  GLUCOSE  --   < > 98 88 93 116* 122*  BUN  --   < > 9 11 15 20  21*  CREATININE  --   < > 0.72 0.68 0.71 0.69 0.74  CALCIUM  --   < > 8.3* 8.3* 8.5* 8.9 9.0  MG 1.7  --   --   --   --   --   --   < > = values in this interval not displayed. Liver Function Tests:  Recent Labs Lab 06/09/17 1724 06/13/17 0424  AST 16 13*  ALT 22 13*  ALKPHOS 62 59  BILITOT 0.8 1.1  PROT 5.8* 5.9*  ALBUMIN 3.8 3.6   No results for input(s): LIPASE, AMYLASE in the last 168 hours. No results for input(s): AMMONIA in the last 168 hours. CBC:  Recent Labs Lab 06/09/17 1724 06/10/17 0528 06/11/17 0622 06/13/17 0424 06/15/17 0551  WBC 10.7* 11.0* 11.2* 12.0* 11.3*  NEUTROABS 8.4*  --   --  8.4* 7.7  HGB 14.9 14.2 14.8 15.2* 14.8  HCT 49.3* 48.4*  50.7* 49.5* 49.5*  MCV 89.6 90.3 89.7 88.4 88.2  PLT 70* 68* PLATELET CLUMPS NOTED ON SMEAR, COUNT APPEARS DECREASED 58* 51*   Cardiac Enzymes:  Recent Labs Lab 06/09/17 1724 06/10/17 0028 06/10/17 0528 06/10/17 1054  TROPONINI <0.03 <0.03 <0.03 <0.03   BNP: Invalid input(s): POCBNP CBG: No results for input(s): GLUCAP in the last 168 hours.  No results found for this or any previous visit (from the past 240 hour(s)).   Studies:              All Imaging reviewed and is as per above notation   Scheduled Meds: . carvedilol  6.25 mg Oral BID WC  . doxycycline  100 mg Oral Q12H  . furosemide  80 mg Intravenous Q12H  . lisinopril  5 mg Oral Daily  . magnesium oxide  400 mg Oral Daily  . mouth rinse  15 mL Mouth Rinse BID  . potassium chloride SA  40 mEq Oral BID  . saccharomyces boulardii  250 mg Oral BID   Continuous Infusions:   Assessment/Plan:  NYHA class 2-3 heart failure symptoms, acute Diastolic HF-EF 32-20% Habitus of obstructive hypoventilation  syndrome Body mass index is 43.26 kg/m.   -8.8 L  Her weight on discharge in 2016 was 73 kg. She is currently 98.2 kg-->96 kg-->93K  She has a way to go in terms of diuresis-for now continue 80 IV ii/dayLasix  lisinopril 5 has been added by cardiology as has coreg 3.125 bid  Cath deferred 2/2 to TTP  Nutritionist input appreciated patient understands  Probable left lower extremity cellulitis  It does not appear purulent at this time, start doxycycline 100 twice a day  Place Unna boots  Feet to be examined in 10/2  TTP lost to follow-up  We will cc Dr. Alvy Bimler as an [place] [place] and will encourage patient to follow-up as an outpatient with her  PLT clumping on the last labs so we will repeat 9/29  Medical noncompliance  Have encouraged the patient to take her meds as when necessary  Prior history serous cystadenoma status post resection 10/2007 Prior history incisional hernia repair status post exploratory laparoscopy 2016 Primary history T12 vertebral compression fracture status post repair  Her back pain should be managed with non-opioid modalities  Inpatient We will diuresis IV again today Her goal weight is probably below 90 kg We will need to monitor her over the course of the next couple of days--I will reassess her for discharge tomorrow   Verneita Griffes, MD  [facility] Pager [phone] 06/15/2017, 4:25 PM    LOS: 6 days

## 2017-06-15 NOTE — Progress Notes (Signed)
Physical Therapy Treatment Patient Details Name: [name] MRN: [id number] DOB: 03-Dec-1950 Today's Date: 06/15/2017    History of Present Illness [name] is a 66 y.o. female with history of hypertension chronic low back pain diopathic thrombocytopenia many purpura has not been to a physician for long time presents to the ER with complaints of worsening shortness of breath and increasing lower extremity edema.    PT Comments    Pt is making progress towards her goals. Pt currently supervision for transfers and ambulation of 150 feet with RW. Pt SaO2 remained above 93%O2 throughout session on 2L O2 via nasal cannula. Pt requires skilled PT to progress mobility and improve strength and endurance to safely navigate their discharge environment.   Follow Up Recommendations  Home health PT     Equipment Recommendations  3in1 (PT) (rollator )    Recommendations for Other Services OT consult     Precautions / Restrictions Precautions Precautions: Fall Precaution Comments: Watch O2 sats Restrictions Weight Bearing Restrictions: No    Mobility  Bed Mobility               General bed mobility comments: pt OOB in recliner upon arrival--sleeps in recliner at home  Transfers Overall transfer level: Needs assistance Equipment used: None Transfers: Sit to/from Stand Sit to Stand: Supervision         General transfer comment: supervision for safety  Ambulation/Gait Ambulation/Gait assistance: Supervision Ambulation Distance (Feet): 150 Feet Assistive device: Rolling walker (2 wheeled);None Gait Pattern/deviations: Decreased step length - right;Decreased step length - left;Trunk flexed;Step-through pattern Gait velocity: decreased Gait velocity interpretation: Below normal speed for age/gender General Gait Details: vc for staying within RW, required 1x standing rest break for 2/4 DoE     Balance Overall balance assessment: No apparent balance deficits (not  formally assessed)                                          Cognition Arousal/Alertness: Awake/alert Behavior During Therapy: WFL for tasks assessed/performed Overall Cognitive Status: Within Functional Limits for tasks assessed                                        Exercises      General Comments General comments (skin integrity, edema, etc.): Pt on 2L O2 via nasal cannula, SaO2 96% prior to ambulation HR 70 bpm, with ambulation SaO2 93%O2 max HR 83 bpm, after ambulation SaO2 94% and HR 72 bpm      Pertinent Vitals/Pain Pain Assessment: Faces Faces Pain Scale: Hurts a little bit Pain Location: back Pain Descriptors / Indicators: Aching;Grimacing Pain Intervention(s): Monitored during session;Limited activity within patient's tolerance           PT Goals (current goals can now be found in the care plan section) Acute Rehab PT Goals Patient Stated Goal: Hopes to get home PT Goal Formulation: With patient Time For Goal Achievement: 06/24/17 Potential to Achieve Goals: Good    Frequency    Min 3X/week      PT Plan Current plan remains appropriate       AM-PAC PT "6 Clicks" Daily Activity  Outcome Measure  Difficulty turning over in bed (including adjusting bedclothes, sheets and blankets)?: A Lot Difficulty moving from lying on back to sitting on the  side of the bed? : A Little Difficulty sitting down on and standing up from a chair with arms (e.g., wheelchair, bedside commode, etc,.)?: A Little Help needed moving to and from a bed to chair (including a wheelchair)?: A Little Help needed walking in hospital room?: A Little Help needed climbing 3-5 steps with a railing? : A Little 6 Click Score: 17    End of Session Equipment Utilized During Treatment: Oxygen;Gait belt Activity Tolerance: Patient tolerated treatment well Patient left: in chair;with call bell/phone within reach Nurse Communication: Mobility status PT Visit  Diagnosis: Unsteadiness on feet (R26.81);Other abnormalities of gait and mobility (R26.89)     Time: 3817-7116 PT Time Calculation (min) (ACUTE ONLY): 22 min  Charges:  $Gait Training: 8-22 mins                    G Codes:       Yasmin Dibello B. Migdalia Dk PT, DPT Acute Rehabilitation  [phone] Pager [phone]     [place] 06/15/2017, 2:11 PM

## 2017-06-15 NOTE — Care Management Note (Signed)
Case Management Note  Patient Details  Name: [name] MRN: [id number] Date of Birth: 1951-07-01  Subjective/Objective:      CHF             Action/Plan: Patient lives at home with her daughter; PCP: [name], NP/ [facility]; has private insurance with BCBS with prescription drug coverage; pharmacy of choice is [facility]; Patient could benefit from a Disease Management Program for CHF; [place] choice offered, pt chose [facility]; Mary with [facility] called for arrangement; she is also requesting a cane at discharge.  Expected Discharge Date:  Possibly 06/17/2017             Expected Discharge Plan:  Home/Self Care  Discharge planning Services  CM Consult  Choice offered to:  Patient  DME Arranged:  Kasandra Knudsen DME Agency:  [place] Arranged:  RN, Disease Management, PT [place] Agency:  [facility] (now [facility])  Status of Service:  In process, will continue to follow  Sherrilyn Rist [phone] 06/15/2017, 10:59 AM

## 2017-06-16 LAB — CBC WITH DIFFERENTIAL/PLATELET
Basophils Absolute: 0 10*3/uL (ref 0.0–0.1)
Basophils Relative: 0 %
Eosinophils Absolute: 0.5 10*3/uL (ref 0.0–0.7)
Eosinophils Relative: 5 %
HCT: 49.6 % — ABNORMAL HIGH (ref 36.0–46.0)
Hemoglobin: 15.4 g/dL — ABNORMAL HIGH (ref 12.0–15.0)
Lymphocytes Relative: 25 %
Lymphs Abs: 2.7 10*3/uL (ref 0.7–4.0)
MCH: 27.4 pg (ref 26.0–34.0)
MCHC: 31 g/dL (ref 30.0–36.0)
MCV: 88.1 fL (ref 78.0–100.0)
Monocytes Absolute: 1 10*3/uL (ref 0.1–1.0)
Monocytes Relative: 9 %
Neutro Abs: 6.7 10*3/uL (ref 1.7–7.7)
Neutrophils Relative %: 61 %
Platelets: 51 10*3/uL — ABNORMAL LOW (ref 150–400)
RBC: 5.63 MIL/uL — ABNORMAL HIGH (ref 3.87–5.11)
RDW: 15.2 % (ref 11.5–15.5)
WBC: 10.9 10*3/uL — ABNORMAL HIGH (ref 4.0–10.5)

## 2017-06-16 LAB — COMPREHENSIVE METABOLIC PANEL
ALT: 21 U/L (ref 14–54)
AST: 18 U/L (ref 15–41)
Albumin: 3.6 g/dL (ref 3.5–5.0)
Alkaline Phosphatase: 68 U/L (ref 38–126)
Anion gap: 6 (ref 5–15)
BUN: 24 mg/dL — ABNORMAL HIGH (ref 6–20)
CO2: 37 mmol/L — ABNORMAL HIGH (ref 22–32)
Calcium: 9 mg/dL (ref 8.9–10.3)
Chloride: 91 mmol/L — ABNORMAL LOW (ref 101–111)
Creatinine, Ser: 0.83 mg/dL (ref 0.44–1.00)
GFR calc Af Amer: >60 mL/min (ref >60)
GFR calc non Af Amer: >60 mL/min (ref >60)
Glucose, Bld: 101 mg/dL — ABNORMAL HIGH (ref 65–99)
Potassium: 5.3 mmol/L — ABNORMAL HIGH (ref 3.5–5.1)
Sodium: 134 mmol/L — ABNORMAL LOW (ref 135–145)
Total Bilirubin: 1 mg/dL (ref 0.3–1.2)
Total Protein: 5.9 g/dL — ABNORMAL LOW (ref 6.5–8.1)

## 2017-06-16 MED ORDER — FUROSEMIDE 10 MG/ML IJ SOLN
80.0000 mg | Freq: Every day | INTRAMUSCULAR | Status: DC
  Administered 2017-06-17: 80 mg via INTRAVENOUS
  Filled 2017-06-16: qty 8

## 2017-06-16 NOTE — Progress Notes (Signed)
Orthopedic Tech Progress Note Patient Details:  [name] Jun 06, 1951 [id number]  Ortho Devices Type of Ortho Device: Haematologist Ortho Device/Splint Location: (B) LE Ortho Device/Splint Interventions: Ordered, Application   Braulio Bosch 06/16/2017, 8:30 PM

## 2017-06-16 NOTE — Progress Notes (Signed)
[name] IZT:[id number] DOB: 1951/08/18 DOA: 06/09/2017 PCP: [name], NP  Brief narrative:  66 year old ? Hypertension Known chronic back pain Idiopathic thrombocytopenic purpura followed by Dr. Alvy Bimler Prior incisional hernia with obstruction status post exploratory laparoscopy 11/17/2014 Prior T12 compression fracture Prior large adnexal mass? 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  Admitted overnight 9/25 with subacute shortness of breath Appears to run out of multiple hypertensive agentsAnd has not been compliant for while  Cardiology consulted echocardiogram done, cardiac cath contemplated that because of ITP probably not a good idea Medical management diuresis  Past medical history-As per Problem list Chart reviewed as below- reviewed  Consultants:  Cardiology  Procedures:    Antibiotics:  doxycycline 9/28--stop date 10/6   Subjective   Stumped toe x 2 with pain No n/v/cp Eating fair, feels swelling is overall improved   Objective    Interim History:   Telemetry: none   Objective: Vitals:   06/15/17 1600 06/15/17 1609 06/15/17 2142 06/16/17 0556  BP:   (!) 151/52 126/66  Pulse:   73 72  Resp:   18 18  Temp:   98.5 F (36.9 C) 97.9 F (36.6 C)  TempSrc:   Oral Oral  SpO2: 97% 94% 98% 99%  Weight:    92.5 kg (204 lb)  Height:        Intake/Output Summary (Last 24 hours) at 06/16/17 1110 Last data filed at 06/16/17 0900  Gross per 24 hour  Intake              915 ml  Output             1075 ml  Net             -160 ml    Exam:  Obese pleasant oriented no distress S1-S2 no murmur rub or gallop Chest clinically clear without added sound Abdomen soft nontender no rebound no guarding Neurologically intact without added sound Lower extremity cellulitis as below    Data Reviewed: Basic Metabolic Panel:  Recent Labs Lab 06/10/17 0028  06/12/17 0345 06/13/17 0424 06/15/17 0800  06/15/17 1247 06/16/17 0455  NA  --   < > 136 137 133* 134* 134*  K  --   < > 3.5 4.1 5.3* 5.4* 5.3*  CL  --   < > 91* 92* 93* 93* 91*  CO2  --   < > 36* 37* 32 35* 37*  GLUCOSE  --   < > 88 93 116* 122* 101*  BUN  --   < > 11 15 20  21* 24*  CREATININE  --   < > 0.68 0.71 0.69 0.74 0.83  CALCIUM  --   < > 8.3* 8.5* 8.9 9.0 9.0  MG 1.7  --   --   --   --   --   --   < > = values in this interval not displayed. Liver Function Tests:  Recent Labs Lab 06/09/17 1724 06/13/17 0424 06/16/17 0455  AST 16 13* 18  ALT 22 13* 21  ALKPHOS 62 59 68  BILITOT 0.8 1.1 1.0  PROT 5.8* 5.9* 5.9*  ALBUMIN 3.8 3.6 3.6   No results for input(s): LIPASE, AMYLASE in the last 168 hours. No results for input(s): AMMONIA in the last 168 hours. CBC:  Recent Labs Lab 06/09/17 1724 06/10/17 0528 06/11/17 0622 06/13/17 0424 06/15/17 0551 06/16/17 0455  WBC 10.7* 11.0* 11.2* 12.0* 11.3* 10.9*  NEUTROABS 8.4*  --   --  8.4* 7.7 6.7  HGB 14.9 14.2 14.8 15.2* 14.8 15.4*  HCT 49.3* 48.4* 50.7* 49.5* 49.5* 49.6*  MCV 89.6 90.3 89.7 88.4 88.2 88.1  PLT 70* 68* PLATELET CLUMPS NOTED ON SMEAR, COUNT APPEARS DECREASED 58* 51* 51*   Cardiac Enzymes:  Recent Labs Lab 06/09/17 1724 06/10/17 0028 06/10/17 0528 06/10/17 1054  TROPONINI <0.03 <0.03 <0.03 <0.03   BNP: Invalid input(s): POCBNP CBG: No results for input(s): GLUCAP in the last 168 hours.  No results found for this or any previous visit (from the past 240 hour(s)).   Studies:              All Imaging reviewed and is as per above notation   Scheduled Meds: . carvedilol  6.25 mg Oral BID WC  . doxycycline  100 mg Oral Q12H  . furosemide  80 mg Intravenous Q12H  . magnesium oxide  400 mg Oral Daily  . mouth rinse  15 mL Mouth Rinse BID  . saccharomyces boulardii  250 mg Oral BID   Continuous Infusions:   Assessment/Plan:  NYHA class 2-3 heart failure symptoms, acute Diastolic HF-EF 62-70% [place]itus of obstructive  hypoventilation syndrome Body mass index is 42.64 kg/m.   - 7.6 L   Her weight on discharge in 2016 was 73 kg. She is currently 98.2 kg-->96 kg-->93K-->92.5 KG  Lasix 80 IV ii/day---Lasix changed to iv x 1 80 daily  Stopped lisinopril 5 see below 10/2  continue coreg 3.125 bid  Cath deferred 2/2 to TTP  Nutritionist input appreciated patient understands  Probable left lower extremity cellulitis  It does not appear purulent at this time, start doxycycline 100 twice a day  See exam as above  Mild acute kidney injury  Creatinine rising, K 5.3  Holding Lisinopril today, lasix changed to po  Stubbed 3rd toe on R side  Attempted to call Podiatry--She will need OP follow up  TTP lost to follow-up  We will cc Dr. Alvy Bimler as an [place] [place] and will encourage patient to follow-up as an outpatient with her  PLT clumping on the last labs so we will repeat 9/29  Medical noncompliance  Have encouraged the patient to take her meds as when necessary  Prior history serous cystadenoma status post resection 10/2007 Prior history incisional hernia repair status post exploratory laparoscopy 2016 Primary history T12 vertebral compression fracture status post repair  Her back pain should be managed with non-opioid modalities  Inpatient We will diuresis IV again today Her goal weight is probably below 90 kg We will need to monitor her labs over the course of the next day   Verneita Griffes, MD  [facility] Pager [phone] 06/16/2017, 11:10 AM    LOS: 7 days

## 2017-06-16 NOTE — Progress Notes (Signed)
Physical Therapy Treatment Patient Details Name: [name] MRN: [id number] DOB: [birth year] Today's Date: 06/16/2017    History of Present Illness [name] is a 66 y.o. female with history of hypertension chronic low back pain diopathic thrombocytopenia many purpura has not been to a physician for long time presents to the ER with complaints of worsening shortness of breath and increasing lower extremity edema.    PT Comments    Pt is progressing towards goals today and is currently supervision for transfers and ambulation and min guard for ascend/descend of 5 steps. Pt does have some safety awareness deficits and was educated on need to keep her nasal cannula in place to keep her SaO2 above 90%O2. Pt also required reminding to keep the RW with her even if she is just walking a few steps to the chair. Pt requires skilled PT to progress mobility and improve strength and endurance to safely navigate their discharge environment.    Follow Up Recommendations  Home health PT;Supervision - Intermittent     Equipment Recommendations  3in1 (PT) (rollator )    Recommendations for Other Services OT consult     Precautions / Restrictions Precautions Precautions: Fall Precaution Comments: Watch O2 sats Restrictions Weight Bearing Restrictions: No    Mobility  Bed Mobility               General bed mobility comments: pt OOB in recliner upon arrival--sleeps in recliner at home  Transfers Overall transfer level: Needs assistance Equipment used: None Transfers: Sit to/from Stand Sit to Stand: Supervision         General transfer comment: supervision for safety  Ambulation/Gait Ambulation/Gait assistance: Supervision Ambulation Distance (Feet): 150 Feet Assistive device: Rolling walker (2 wheeled);None Gait Pattern/deviations: Decreased step length - right;Decreased step length - left;Trunk flexed;Step-through pattern Gait velocity: decreased Gait velocity  interpretation: Below normal speed for age/gender General Gait Details: vc for staying within RW,    Stairs Stairs: Yes   Stair Management: One rail Right;Forwards;Sideways;Alternating pattern Number of Stairs: 5 General stair comments: min guard for safety, pt with 4/4 DoE and O2 desaturation with ascent/descent of stairs, vc for pursed lipped breathing      Balance Overall balance assessment: No apparent balance deficits (not formally assessed)                                          Cognition Arousal/Alertness: Awake/alert Behavior During Therapy: WFL for tasks assessed/performed Overall Cognitive Status: Within Functional Limits for tasks assessed                                           General Comments General comments (skin integrity, edema, etc.): Pt without nasal cannula on at entry and SaO2 of 85%O2. Pt asked to replace 2L O2 via nasal cannula and perform pursed lipped breathing. SaO2 rebounded to 95%O2 within 1 minute. Pt with SaO2 >93%O2 with ambulation however with ascent/descent of 5 stairs SaO2 dropped to 85%O2. Pt required standing rest break and vc for pursed lipped breathing. Pt required 1 minute for breathing to normalize and SaO2 to rebound to 93%O2.      Pertinent Vitals/Pain Pain Assessment: Faces Faces Pain Scale: Hurts a little bit Pain Location: back Pain Descriptors / Indicators: Aching;Grimacing Pain Intervention(s): Monitored during  session           PT Goals (current goals can now be found in the care plan section) Acute Rehab PT Goals Patient Stated Goal: Hopes to get home PT Goal Formulation: With patient Time For Goal Achievement: 06/24/17 Potential to Achieve Goals: Good Progress towards PT goals: Progressing toward goals    Frequency    Min 3X/week      PT Plan Current plan remains appropriate       AM-PAC PT "6 Clicks" Daily Activity  Outcome Measure  Difficulty turning over in bed  (including adjusting bedclothes, sheets and blankets)?: A Lot Difficulty moving from lying on back to sitting on the side of the bed? : A Little Difficulty sitting down on and standing up from a chair with arms (e.g., wheelchair, bedside commode, etc,.)?: A Little Help needed moving to and from a bed to chair (including a wheelchair)?: None Help needed walking in hospital room?: A Little Help needed climbing 3-5 steps with a railing? : A Little 6 Click Score: 18    End of Session Equipment Utilized During Treatment: Oxygen;Gait belt Activity Tolerance: Patient tolerated treatment well Patient left: in chair;with call bell/phone within reach Nurse Communication: Mobility status PT Visit Diagnosis: Unsteadiness on feet (R26.81);Other abnormalities of gait and mobility (R26.89)     Time: 4097-3532 PT Time Calculation (min) (ACUTE ONLY): 18 min  Charges:  $Gait Training: 8-22 mins                    G Codes:       Kasten Leveque B. Migdalia Dk PT, DPT Acute Rehabilitation  [phone] Pager [phone]     [place] 06/16/2017, 12:54 PM

## 2017-06-17 DIAGNOSIS — I5033 Acute on chronic diastolic (congestive) heart failure: Secondary | ICD-10-CM

## 2017-06-17 DIAGNOSIS — I10 Essential (primary) hypertension: Secondary | ICD-10-CM

## 2017-06-17 DIAGNOSIS — I509 Heart failure, unspecified: Secondary | ICD-10-CM

## 2017-06-17 DIAGNOSIS — D693 Immune thrombocytopenic purpura: Secondary | ICD-10-CM

## 2017-06-17 LAB — COMPREHENSIVE METABOLIC PANEL
ALT: 21 U/L (ref 14–54)
AST: 20 U/L (ref 15–41)
Albumin: 3.9 g/dL (ref 3.5–5.0)
Alkaline Phosphatase: 86 U/L (ref 38–126)
Anion gap: 9 (ref 5–15)
BUN: 24 mg/dL — ABNORMAL HIGH (ref 6–20)
CO2: 36 mmol/L — ABNORMAL HIGH (ref 22–32)
Calcium: 9.2 mg/dL (ref 8.9–10.3)
Chloride: 88 mmol/L — ABNORMAL LOW (ref 101–111)
Creatinine, Ser: 0.79 mg/dL (ref 0.44–1.00)
GFR calc Af Amer: >60 mL/min (ref >60)
GFR calc non Af Amer: >60 mL/min (ref >60)
Glucose, Bld: 132 mg/dL — ABNORMAL HIGH (ref 65–99)
Potassium: 4.6 mmol/L (ref 3.5–5.1)
Sodium: 133 mmol/L — ABNORMAL LOW (ref 135–145)
Total Bilirubin: 0.8 mg/dL (ref 0.3–1.2)
Total Protein: 6.3 g/dL — ABNORMAL LOW (ref 6.5–8.1)

## 2017-06-17 MED ORDER — FUROSEMIDE 10 MG/ML IJ SOLN
80.0000 mg | Freq: Two times a day (BID) | INTRAMUSCULAR | Status: DC
  Administered 2017-06-17 – 2017-06-20 (×6): 80 mg via INTRAVENOUS
  Filled 2017-06-17 (×7): qty 8

## 2017-06-17 MED ORDER — SALINE SPRAY 0.65 % NA SOLN
1.0000 | NASAL | Status: DC | PRN
  Filled 2017-06-17: qty 44

## 2017-06-17 NOTE — Progress Notes (Signed)
PROGRESS NOTE  [name] UXL:[id number] DOB: [birth year] DOA: 06/09/2017 PCP: [name], NP   LOS: 8 days   Brief Narrative / Interim history: 66 year old female with medical history significant for hypertension, chronic back pain, ITP, and with history of tobacco use. She does not have a regular PCP and only sees her back doctor regularly for pain management. For several months the patient has experienced gradual worsening of shortness of breath and lower extremity edema.  The breathing and lower extremity edema recently worsened to the extent that fluid was draining from her legs and she decided to go to ED.  ED Course: Vitals - BP 177/67, P 91, RR 19, SpO2 95%. Patient had significant edema up to thighs bilaterally. CXR revealed cardiomegaly. EKG showed normal sinus rhythm. BNP was 477.  Patient was admitted for CHF and started on IV lasix for diureses.   Assessment & Plan: Principal Problem:   Acute congestive heart failure (HCC) Active Problems:   HTN (hypertension)   Idiopathic thrombocytopenic purpura (HCC)   Tobacco abuse   CHF (congestive heart failure) ([place])   1. Acute Congestive Heart Failure - newly diagnosed - Echo 9/26 consistent with grade 1 diastolic dysfunction; severe LVH with normal systolic function; EF 53-66% - Increase Lasix back to 80 mg IV BID - patient gained 3 pounds after dose was reduced to 80 mg once daily. Continue to monitor electrolytes and kidney function. - Continue Coreg - Trial of d/c nasal cannula - monitor O2sats with ambulation - Stressed importance of establishing a regular PCP with consistent follow up. - Lifestyle Modifications: recommend decreasing PO sodium consumption and reducing water intake; recommend significant weight loss   2. Hypertension - Well-controlled at this time - continue Coreg, Lasix  3. Venous stasis of lower extremities - Unna boot changed yesterday bilaterally - Continue doxycycline with concern concern  for left lower extremity cellulitis  4. Idiopathic Thrombocytopenia Purpura - Follow up with Dr. Alvy Bimler outpatient  5. History of Tobacco Abuse - patient reports quitting 2.5 years ago  DVT prophylaxis: SCDs Code Status: full Family Communication: none at bedside Disposition Plan: home   Consultants:   Cardiology  Procedures:   2D echo: 9/26  Antimicrobials:  Doxycycline    Subjective: Patient reports no changes from yesterday and has no new complaints. She reports her O2 sats dropped yesterday while walking hall with PT and they sat her down to reinsert her nasal cannula. She denied SOB, chest pain or any other symptoms with this episode. Today she was able to walk to the bathroom without O2 without becoming symptomatic. She states her edema is mostly unchanged from yesterday, however it is definitely improved from the time she was admitted.  She denies fever, chills, rash, SOB, cough, CP, abdominal pain, nausea, vomiting, diarrhea and worsening edema. She reports some mild constipation and abdominal bloating.  Objective: Vitals:   06/16/17 1223 06/16/17 1941 06/17/17 0523 06/17/17 1052  BP: (!) 131/57 130/66 119/65   Pulse: 70 69 (!) 52   Resp: 18 18 18    Temp: 98 F (36.7 C) 98 F (36.7 C) 98.3 F (36.8 C)   TempSrc: Oral Oral Oral   SpO2: 93% 98% 98% 93%  Weight:   94.2 kg (207 lb 9.6 oz)   Height:        Intake/Output Summary (Last 24 hours) at 06/17/17 1057 Last data filed at 06/17/17 0901  Gross per 24 hour  Intake  480 ml  Output              600 ml  Net             -120 ml   Filed Weights   06/15/17 0500 06/16/17 0556 06/17/17 0523  Weight: 93.9 kg (207 lb) 92.5 kg (204 lb) 94.2 kg (207 lb 9.6 oz)    Examination:  General: Patient sitting upright in bedside chair; appears comfortable Eyes: pupils equal and round; no scleral icterus, no erythema ENMT: Mucous membranes appear moist Neck: normal, supple, no masses, no  thyromegaly Respiratory: clear to auscultation bilaterally, no wheezing, rales, or rhonchi; breathing non-labored with no accessory muscle use Cardiovascular: regular rhythm, bradycardia; radial pulses intact, unable to appreciate dorsal pedal pulses with bilateral unna boots; significant lower extremity pitting edema bilaterally Abdomen: protuberant abdomen; bowel sounds present; non-tender to palpation; feels somewhat distended.  Musculoskeletal: no obvious deformity, normal muscle tone Skin: no rashes; no erythema, warmth or open lesions above wraps on lower extremities Psychiatric: mood is normal; alert and oriented X3; cooperative   Data Reviewed: I have independently reviewed following labs and imaging studies   CBC:  Recent Labs Lab 06/11/17 0622 06/13/17 0424 06/15/17 0551 06/16/17 0455  WBC 11.2* 12.0* 11.3* 10.9*  NEUTROABS  --  8.4* 7.7 6.7  HGB 14.8 15.2* 14.8 15.4*  HCT 50.7* 49.5* 49.5* 49.6*  MCV 89.7 88.4 88.2 88.1  PLT PLATELET CLUMPS NOTED ON SMEAR, COUNT APPEARS DECREASED 58* 51* 51*   Basic Metabolic Panel:  Recent Labs Lab 06/13/17 0424 06/15/17 0800 06/15/17 1247 06/16/17 0455 06/17/17 0314  NA 137 133* 134* 134* 133*  K 4.1 5.3* 5.4* 5.3* 4.6  CL 92* 93* 93* 91* 88*  CO2 37* 32 35* 37* 36*  GLUCOSE 93 116* 122* 101* 132*  BUN 15 20 21* 24* 24*  CREATININE 0.71 0.69 0.74 0.83 0.79  CALCIUM 8.5* 8.9 9.0 9.0 9.2   GFR: Estimated Creatinine Clearance: 69.8 mL/min (by C-G formula based on SCr of 0.79 mg/dL). Liver Function Tests:  Recent Labs Lab 06/13/17 0424 06/16/17 0455 06/17/17 0314  AST 13* 18 20  ALT 13* 21 21  ALKPHOS 59 68 86  BILITOT 1.1 1.0 0.8  PROT 5.9* 5.9* 6.3*  ALBUMIN 3.6 3.6 3.9   No results for input(s): LIPASE, AMYLASE in the last 168 hours. No results for input(s): AMMONIA in the last 168 hours. Coagulation Profile: No results for input(s): INR, PROTIME in the last 168 hours. Cardiac Enzymes: No results for  input(s): CKTOTAL, CKMB, CKMBINDEX, TROPONINI in the last 168 hours. BNP (last 3 results) No results for input(s): PROBNP in the last 8760 hours. HbA1C: No results for input(s): HGBA1C in the last 72 hours. CBG: No results for input(s): GLUCAP in the last 168 hours. Lipid Profile: No results for input(s): CHOL, HDL, LDLCALC, TRIG, CHOLHDL, LDLDIRECT in the last 72 hours. Thyroid Function Tests: No results for input(s): TSH, T4TOTAL, FREET4, T3FREE, THYROIDAB in the last 72 hours. Anemia Panel: No results for input(s): VITAMINB12, FOLATE, FERRITIN, TIBC, IRON, RETICCTPCT in the last 72 hours. Urine analysis:    Component Value Date/Time   COLORURINE STRAW (A) 06/09/2017 2044   APPEARANCEUR CLEAR 06/09/2017 2044   LABSPEC 1.005 06/09/2017 2044   PHURINE 6.0 06/09/2017 2044   GLUCOSEU NEGATIVE 06/09/2017 2044   HGBUR SMALL (A) 06/09/2017 2044   BILIRUBINUR NEGATIVE 06/09/2017 2044   KETONESUR NEGATIVE 06/09/2017 2044   PROTEINUR NEGATIVE 06/09/2017 2044   UROBILINOGEN 0.2 11/13/2014 0233  NITRITE NEGATIVE 06/09/2017 2044   LEUKOCYTESUR NEGATIVE 06/09/2017 2044   Sepsis Labs: Invalid input(s): PROCALCITONIN, LACTICIDVEN  No results found for this or any previous visit (from the past 240 hour(s)).    Radiology Studies: No results found.   Scheduled Meds: . carvedilol  6.25 mg Oral BID WC  . doxycycline  100 mg Oral Q12H  . furosemide  80 mg Intravenous Q12H  . magnesium oxide  400 mg Oral Daily  . mouth rinse  15 mL Mouth Rinse BID  . saccharomyces boulardii  250 mg Oral BID   Continuous Infusions:   Arlana Lind[place], [place] 06/17/2017, 10:57 AM

## 2017-06-17 NOTE — Progress Notes (Addendum)
Heart Failure Navigator Consult Note  Presentation: Per Dr Hal Hope: [name] is a 66 y.o. female with history of hypertension chronic low back pain idiopathic thrombocytopenia purpura.  She has not been to a physician for long time and presents to the ER with complaints of worsening shortness of breath and increasing lower extremity edema. Patient has been having these symptoms for last many months but since symptoms have acutely worsened with fluid draining out of the legs patient decided to come to the ER. Denies any fever chills chest pain or productive cough.   Past Medical History:  Diagnosis Date  . Arthritis    lumbar burst fx, osteoporosis  . Back pain 09/29/2013  . Blood dyscrasia   . Chest pain    denies  . Chronic kidney disease    passed stones spontaneously- 40 yrs. ago   . Depression    states her current situation with activity limitations, she has a low spirit at times.   Marland Kitchen Dysrhythmia    occ tachycardia  . Fracture of vertebra, compression ([place])   . GERD (gastroesophageal reflux disease)    occ  . History of kidney stones   . HTN (hypertension)   . ITP (idiopathic thrombocytopenic purpura) 08/29/2013  . Leukocytosis, unspecified 09/05/2013  . Muscle cramp 11/16/2013  . Tachycardia    occ upon exersion, and when put on bp med    Social History   Social History  . Marital status: Divorced    Spouse name: N/A  . Number of children: N/A  . Years of education: N/A   Social History Main Topics  . Smoking status: Former Smoker    Packs/day: 0.50    Years: 23.00    Types: Cigarettes    Quit date: 09/15/2014  . Smokeless tobacco: Never Used  . Alcohol use No  . Drug use: No  . Sexual activity: Not Asked   Other Topics Concern  . None   Social History Narrative  . None    ECHO:Study Conclusions-06/10/17  - Left ventricle: The cavity size was normal. Wall thickness was   increased in a pattern of severe LVH. Systolic function was   normal. The  estimated ejection fraction was in the range of 60%   to 65%. Doppler parameters are consistent with abnormal left   ventricular relaxation (grade 1 diastolic dysfunction). - Aortic valve: There was mild regurgitation. - Atrial septum: No defect or patent foramen ovale was identified. - Pulmonary arteries: PA peak pressure: 37 mm Hg (S). - Pericardium, extracardiac: Small localized posterior pericardial   effusion.  ------------------------------------------------------------------- Study data:  No prior study was available for comparison.  Study status:  Routine.  Procedure:  The patient reported no pain pre or post test. Transthoracic echocardiography. Image quality was adequate.  Study completion:  There were no complications. Transthoracic echocardiography.  M-mode, complete 2D, spectral Doppler, and color Doppler.  Birthdate:  Patient birthdate: Nov 15, 1950.  Age:  Patient is 66 yr old.  Sex:  Gender: female. BMI: 45.3 kg/m^2.  Blood pressure:     143/67  Patient status: Inpatient.  Study date:  Study date: 06/10/2017. Study time: 11:42 AM.  Location:  Echo laboratory.   BNP    Component Value Date/Time   BNP 477.7 (H) 06/09/2017 1726    ProBNP No results found for: PROBNP   Education Assessment and Provision:  Detailed education and instructions provided on heart failure disease management including the following:  Signs and symptoms of Heart Failure When to call  the physician Importance of daily weights Low sodium diet Fluid restriction Medication management Anticipated future follow-up appointments  Patient education given on each of the above topics.  Patient acknowledges understanding and acceptance of all instructions.  I have seen patient and we reviewed her current hospitalization as well as new diagnosis of HF.  She tells me that she is slowly beginning to understand what providers are explaining.  She does not have a scale at home however I will provide one  for home use. We discussed the importance of daily weights and when to contact the physician.  I also reviewed a low sodium diet and high sodium foods to avoid.  She admits that she and her family "eat out fast food often".  I discouraged these habits due to the high sodium content.  She admits that she struggles with paying " high doctor bills" and buying medications.  She does have commercial insurance--however says that she is currently not working.  She will benefit from referral to the [facility] as well as [facility] SW referral for ongoing financial concerns and assistance.  Education Materials:  "Living Better With Heart Failure" Booklet, Daily Weight Tracker Tool    High Risk Criteria for Readmission and/or Poor Patient Outcomes:  (Recommend Follow-up with [facility])--yes patient will need to follow-up as outpatient   EF <30%- No 60-65% with grade 1 dias dys.  2 or more admissions in 6 months- No  Difficult social situation- No  Demonstrates medication noncompliance- Yes- admits to financial issues and inability to purchase meds.   Barriers of Care:  Insight, Knowledge and compliance  Discharge Planning:   Plans to return to home with daughter in [place].  She would benefit from [place] for ongoing education compliance reinforcement and symptom recognition.

## 2017-06-18 DIAGNOSIS — I5031 Acute diastolic (congestive) heart failure: Secondary | ICD-10-CM

## 2017-06-18 LAB — CBC
HCT: 49.8 % — ABNORMAL HIGH (ref 36.0–46.0)
Hemoglobin: 15.7 g/dL — ABNORMAL HIGH (ref 12.0–15.0)
MCH: 27.4 pg (ref 26.0–34.0)
MCHC: 31.5 g/dL (ref 30.0–36.0)
MCV: 86.9 fL (ref 78.0–100.0)
Platelets: 62 10*3/uL — ABNORMAL LOW (ref 150–400)
RBC: 5.73 MIL/uL — ABNORMAL HIGH (ref 3.87–5.11)
RDW: 15.1 % (ref 11.5–15.5)
WBC: 10.5 10*3/uL (ref 4.0–10.5)

## 2017-06-18 LAB — BASIC METABOLIC PANEL
Anion gap: 10 (ref 5–15)
BUN: 21 mg/dL — ABNORMAL HIGH (ref 6–20)
CO2: 37 mmol/L — ABNORMAL HIGH (ref 22–32)
Calcium: 8.9 mg/dL (ref 8.9–10.3)
Chloride: 88 mmol/L — ABNORMAL LOW (ref 101–111)
Creatinine, Ser: 0.75 mg/dL (ref 0.44–1.00)
GFR calc Af Amer: >60 mL/min (ref >60)
GFR calc non Af Amer: >60 mL/min (ref >60)
Glucose, Bld: 100 mg/dL — ABNORMAL HIGH (ref 65–99)
Potassium: 3.9 mmol/L (ref 3.5–5.1)
Sodium: 135 mmol/L (ref 135–145)

## 2017-06-18 LAB — MAGNESIUM: Magnesium: 2.3 mg/dL (ref 1.7–2.4)

## 2017-06-18 NOTE — Progress Notes (Signed)
PROGRESS NOTE  KRISHNA HEUER FIE:[id number] DOB: Apr 29, 1951 DOA: 06/09/2017 PCP: [name], NP   LOS: 9 days   Brief Narrative / Interim history: Patient is 66 year old female with hypertension, chronic back pain, ITP who is lost to follow-up, prior tobacco abuse who was admitted with progressive swelling, shortness of breath over the last several months.  Found herself unable to sleep in the bed and has been sleeping in a recliner, states that mainly this is due to the fact that her legs have become so swollen that she is unable to move them in bed.  Assessment & Plan: Principal Problem:   Acute congestive heart failure (HCC) Active Problems:   HTN (hypertension)   Idiopathic thrombocytopenic purpura (HCC)   Tobacco abuse   CHF (congestive heart failure) (HCC)   Acute diastolic congestive heart failure -Echo done in last week with grade 1 DD, severe LVH, EF was normal at 60-65% -She was maintained on Lasix IV BID, weight improved however when her Lasix was decreased to daily her weight went back up. Now again on Lasix BID, lost 2 lbs yesterday, continue for 1 more day, expect patient to go on po lasix in 1 day and potentially home.  -Discussed again at bedside about low-salt diet and fluid restriction at home  Acute hypoxic respiratory failure -Likely in the setting of #1, suspect obesity hypoventilation and potentially a degree of sleep apnea. -Wean off oxygen as tolerated, recommended weight loss and may benefit from outpatient evaluation for OSA -on room air this morning, encouraged ambulation   Hypertension -Continue Coreg, Lasix, this was not treated at home as patient does not follow-up with a PCP regularly  Bilateral lower extremity venous stasis changes with chronic edema /possible cellulitis -Continue Unna boot, continue doxycycline for possible cellulitis 2 more days remaining   ITP -Platelets appear somewhat at baseline, outpatient follow-up  Tobacco  abuse, in remission -She quit 1/2 years ago  Morbid obesity -Counseled for weight loss   DVT prophylaxis: SCD (thrombocytopenia) Code Status: Full code Family Communication: no family at bedside Disposition Plan: home 1 day  Consultants:   None   Procedures:   2D echo  Antimicrobials:  Doxycycline   Subjective: - no chest pain, shortness of breath, no abdominal pain, nausea or vomiting. On room air, doing better.   Objective: Vitals:   06/17/17 1052 06/17/17 1554 06/17/17 2004 06/18/17 0617  BP:  131/62 135/69 (!) 144/78  Pulse:  81 78 82  Resp:   20 20  Temp:   98.7 F (37.1 C) 98.5 F (36.9 C)  TempSrc:   Oral Oral  SpO2: 93% 92% 94% 92%  Weight:    93.2 kg (205 lb 8 oz)  Height:        Intake/Output Summary (Last 24 hours) at 06/18/17 1225 Last data filed at 06/18/17 0928  Gross per 24 hour  Intake              360 ml  Output             2700 ml  Net            -2340 ml   Filed Weights   06/16/17 0556 06/17/17 0523 06/18/17 0617  Weight: 92.5 kg (204 lb) 94.2 kg (207 lb 9.6 oz) 93.2 kg (205 lb 8 oz)    Examination:  Constitutional: NAD Eyes: lids and conjunctivae normal ENMT: Mucous membranes are moist.  Respiratory: clear to auscultation bilaterally, no wheezing, no crackles. Normal respiratory effort.  No accessory muscle use.  Cardiovascular: Regular rate and rhythm, no murmurs / rubs / gallops. 1+ LE edema. 2+ pedal pulses.  Abdomen: no tenderness. Bowel sounds positive.  Neurologic: non focal  Psychiatric: Normal judgment and insight. Alert and oriented x 3. Normal mood.    Data Reviewed: I have independently reviewed following labs and imaging studies   CBC:  Recent Labs Lab 06/13/17 0424 06/15/17 0551 06/16/17 0455 06/18/17 0619  WBC 12.0* 11.3* 10.9* 10.5  NEUTROABS 8.4* 7.7 6.7  --   HGB 15.2* 14.8 15.4* 15.7*  HCT 49.5* 49.5* 49.6* 49.8*  MCV 88.4 88.2 88.1 86.9  PLT 58* 51* 51* 62*   Basic Metabolic Panel:  Recent  Labs Lab 06/15/17 0800 06/15/17 1247 06/16/17 0455 06/17/17 0314 06/18/17 0619  NA 133* 134* 134* 133* 135  K 5.3* 5.4* 5.3* 4.6 3.9  CL 93* 93* 91* 88* 88*  CO2 32 35* 37* 36* 37*  GLUCOSE 116* 122* 101* 132* 100*  BUN 20 21* 24* 24* 21*  CREATININE 0.69 0.74 0.83 0.79 0.75  CALCIUM 8.9 9.0 9.0 9.2 8.9  MG  --   --   --   --  2.3   GFR: Estimated Creatinine Clearance: 69.3 mL/min (by C-G formula based on SCr of 0.75 mg/dL). Liver Function Tests:  Recent Labs Lab 06/13/17 0424 06/16/17 0455 06/17/17 0314  AST 13* 18 20  ALT 13* 21 21  ALKPHOS 59 68 86  BILITOT 1.1 1.0 0.8  PROT 5.9* 5.9* 6.3*  ALBUMIN 3.6 3.6 3.9   No results for input(s): LIPASE, AMYLASE in the last 168 hours. No results for input(s): AMMONIA in the last 168 hours. Coagulation Profile: No results for input(s): INR, PROTIME in the last 168 hours. Cardiac Enzymes: No results for input(s): CKTOTAL, CKMB, CKMBINDEX, TROPONINI in the last 168 hours. BNP (last 3 results) No results for input(s): PROBNP in the last 8760 hours. HbA1C: No results for input(s): HGBA1C in the last 72 hours. CBG: No results for input(s): GLUCAP in the last 168 hours. Lipid Profile: No results for input(s): CHOL, HDL, LDLCALC, TRIG, CHOLHDL, LDLDIRECT in the last 72 hours. Thyroid Function Tests: No results for input(s): TSH, T4TOTAL, FREET4, T3FREE, THYROIDAB in the last 72 hours. Anemia Panel: No results for input(s): VITAMINB12, FOLATE, FERRITIN, TIBC, IRON, RETICCTPCT in the last 72 hours. Urine analysis:    Component Value Date/Time   COLORURINE STRAW (A) 06/09/2017 2044   APPEARANCEUR CLEAR 06/09/2017 2044   LABSPEC 1.005 06/09/2017 2044   PHURINE 6.0 06/09/2017 2044   GLUCOSEU NEGATIVE 06/09/2017 2044   HGBUR SMALL (A) 06/09/2017 2044   BILIRUBINUR NEGATIVE 06/09/2017 2044   KETONESUR NEGATIVE 06/09/2017 2044   PROTEINUR NEGATIVE 06/09/2017 2044   UROBILINOGEN 0.2 11/13/2014 0233   NITRITE NEGATIVE  06/09/2017 2044   LEUKOCYTESUR NEGATIVE 06/09/2017 2044   Sepsis Labs: Invalid input(s): PROCALCITONIN, LACTICIDVEN  No results found for this or any previous visit (from the past 240 hour(s)).    Radiology Studies: No results found.   Scheduled Meds: . carvedilol  6.25 mg Oral BID WC  . doxycycline  100 mg Oral Q12H  . furosemide  80 mg Intravenous Q12H  . magnesium oxide  400 mg Oral Daily  . mouth rinse  15 mL Mouth Rinse BID  . saccharomyces boulardii  250 mg Oral BID   Continuous Infusions:   Marzetta Board, MD, PhD [facility] Pager [phone]  If 7PM-7AM, please contact night-coverage [email or web address] Password [email or web address] 06/18/2017, 12:25 PM

## 2017-06-18 NOTE — Progress Notes (Signed)
Physical Therapy Treatment Patient Details Name: [name] MRN: [id number] DOB: [birth year] Today's Date: 06/18/2017    History of Present Illness [name] is a 66 y.o. female with history of hypertension chronic low back pain diopathic thrombocytopenia many purpura has not been to a physician for long time presents to the ER with complaints of worsening shortness of breath and increasing lower extremity edema.    PT Comments    Patient overall was mod I/supervision for mobility. Pt still with SOB when ambulating on RA with SpO2 desat to 86%. SpO2 91% at rest on RA. Pt encouraged to use AD upon d/c for energy conservation and balance as pt tends to "furniture walk". Current plan remains appropriate.    Follow Up Recommendations  Home health PT;Supervision - Intermittent     Equipment Recommendations  3in1 (PT) (rollator )    Recommendations for Other Services       Precautions / Restrictions Precautions Precautions: Fall Precaution Comments: Watch O2 sats Restrictions Weight Bearing Restrictions: No    Mobility  Bed Mobility               General bed mobility comments: pt OOB in chair upon arrival  Transfers Overall transfer level: Modified independent                  Ambulation/Gait Ambulation/Gait assistance: Supervision Ambulation Distance (Feet): 180 Feet Assistive device: Rolling walker (2 wheeled);None Gait Pattern/deviations: Decreased step length - right;Decreased step length - left;Trunk flexed;Step-through pattern Gait velocity: decreased   General Gait Details: SOB with mobility; cues for posture, pursed lip breathing, and proximity of RW   Stairs            Wheelchair Mobility    Modified Rankin (Stroke Patients Only)       Balance Overall balance assessment: No apparent balance deficits (not formally assessed)                                          Cognition Arousal/Alertness:  Awake/alert Behavior During Therapy: WFL for tasks assessed/performed Overall Cognitive Status: Within Functional Limits for tasks assessed                                        Exercises      General Comments General comments (skin integrity, edema, etc.): SpO2 desat to 86% on RA while ambulating and up to 91% on RA at rest      Pertinent Vitals/Pain Pain Assessment: Faces Faces Pain Scale: Hurts a little bit Pain Location: back Pain Descriptors / Indicators: Sore Pain Intervention(s): Limited activity within patient's tolerance;Monitored during session;Repositioned    Home Living                      Prior Function            PT Goals (current goals can now be found in the care plan section) Acute Rehab PT Goals PT Goal Formulation: With patient Time For Goal Achievement: 06/24/17 Potential to Achieve Goals: Good Progress towards PT goals: Progressing toward goals    Frequency    Min 3X/week      PT Plan Current plan remains appropriate    Co-evaluation  AM-PAC PT "6 Clicks" Daily Activity  Outcome Measure  Difficulty turning over in bed (including adjusting bedclothes, sheets and blankets)?: A Lot Difficulty moving from lying on back to sitting on the side of the bed? : A Little Difficulty sitting down on and standing up from a chair with arms (e.g., wheelchair, bedside commode, etc,.)?: A Little Help needed moving to and from a bed to chair (including a wheelchair)?: None Help needed walking in hospital room?: None Help needed climbing 3-5 steps with a railing? : A Little 6 Click Score: 19    End of Session Equipment Utilized During Treatment: Oxygen;Gait belt Activity Tolerance: Patient tolerated treatment well Patient left: in chair;with call bell/phone within reach Nurse Communication: Mobility status PT Visit Diagnosis: Unsteadiness on feet (R26.81);Other abnormalities of gait and mobility (R26.89)      Time: 2694-8546 PT Time Calculation (min) (ACUTE ONLY): 25 min  Charges:  $Gait Training: 8-22 mins $Therapeutic Activity: 8-22 mins                    G Codes:       Earney Navy, PTA Pager: [phone]     Darliss Cheney 06/18/2017, 8:52 AM

## 2017-06-19 ENCOUNTER — Other Ambulatory Visit: Payer: Self-pay

## 2017-06-19 LAB — TROPONIN I
Troponin I: <0.03 ng/mL (ref <0.03)
Troponin I: <0.03 ng/mL (ref <0.03)
Troponin I: <0.03 ng/mL (ref <0.03)

## 2017-06-19 LAB — BASIC METABOLIC PANEL
Anion gap: 8 (ref 5–15)
BUN: 23 mg/dL — ABNORMAL HIGH (ref 6–20)
CO2: 35 mmol/L — ABNORMAL HIGH (ref 22–32)
Calcium: 8.7 mg/dL — ABNORMAL LOW (ref 8.9–10.3)
Chloride: 89 mmol/L — ABNORMAL LOW (ref 101–111)
Creatinine, Ser: 0.79 mg/dL (ref 0.44–1.00)
GFR calc Af Amer: >60 mL/min (ref >60)
GFR calc non Af Amer: >60 mL/min (ref >60)
Glucose, Bld: 117 mg/dL — ABNORMAL HIGH (ref 65–99)
Potassium: 3.6 mmol/L (ref 3.5–5.1)
Sodium: 132 mmol/L — ABNORMAL LOW (ref 135–145)

## 2017-06-19 MED ORDER — AMLODIPINE BESYLATE 5 MG PO TABS
5.0000 mg | ORAL_TABLET | Freq: Every day | ORAL | Status: DC
  Administered 2017-06-19 – 2017-06-20 (×2): 5 mg via ORAL
  Filled 2017-06-19 (×2): qty 1

## 2017-06-19 MED ORDER — LISINOPRIL 2.5 MG PO TABS
2.5000 mg | ORAL_TABLET | Freq: Every day | ORAL | Status: DC
  Administered 2017-06-19 – 2017-06-20 (×2): 2.5 mg via ORAL
  Filled 2017-06-19 (×2): qty 1

## 2017-06-19 NOTE — Progress Notes (Signed)
Pt had some CP this am, VS WNL, put pt on 4 L O2, ECG done and in the chart looks normal, once I put pt on O2 pt's CP was relieved, on call MD notified, will cont to monitor, Thanks Arvella Nigh RN

## 2017-06-19 NOTE — Consult Note (Signed)
Ref: [name], NP   Subjective:  Retrosternal chest pain. Patient has known h/o of significant thrombocytopenia. She is not a candidate for cardiac interventions.   Objective:  Vital Signs in the last 24 hours: Temp:  [98.2 F (36.8 C)-98.7 F (37.1 C)] 98.7 F (37.1 C) (10/05 0551) Pulse Rate:  [78-82] 82 (10/05 0551) Resp:  [18-20] 20 (10/05 0551) BP: (134-148)/(65-71) 148/68 (10/05 0551) SpO2:  [91 %-98 %] 98 % (10/05 0551) Weight:  [92.5 kg (203 lb 14.4 oz)] 92.5 kg (203 lb 14.4 oz) (10/05 0300)  Physical Exam: BP Readings from Last 1 Encounters:  06/19/17 (!) 148/68    Wt Readings from Last 1 Encounters:  06/19/17 92.5 kg (203 lb 14.4 oz)    Weight change: -0.726 kg (-1 lb 9.6 oz) Body mass index is 42.62 kg/m. HEENT: [place]/AT, Eyes- PERL, EOMI, Conjunctiva-Pink, Sclera-Non-icteric Neck: No JVD, No bruit, Trachea midline. Lungs:  Clear, Bilateral. Cardiac:  Regular rhythm, normal S1 and S2, no S3. II/VI systolic murmur. Abdomen:  Soft, non-tender. BS present. Extremities:  2 + edema present. No cyanosis. No clubbing. CNS: AxOx3, Cranial nerves grossly intact, moves all 4 extremities.  Skin: Warm and dry.   Intake/Output from previous day: 10/04 0701 - 10/05 0700 In: 464 [P.O.:464] Out: 1951 [EPPIR:5188; Stool:1]    Lab Results: BMET    Component Value Date/Time   NA 132 (L) 06/19/2017 0438   NA 135 06/18/2017 0619   NA 133 (L) 06/17/2017 0314   NA 140 11/16/2013 1238   NA 142 09/29/2013 1500   NA 141 08/24/2013 1419   K 3.6 06/19/2017 0438   K 3.9 06/18/2017 0619   K 4.6 06/17/2017 0314   K 4.1 11/16/2013 1238   K 3.9 09/29/2013 1500   K 4.2 08/24/2013 1419   CL 89 (L) 06/19/2017 0438   CL 88 (L) 06/18/2017 0619   CL 88 (L) 06/17/2017 0314   CO2 35 (H) 06/19/2017 0438   CO2 37 (H) 06/18/2017 0619   CO2 36 (H) 06/17/2017 0314   CO2 24 11/16/2013 1238   CO2 26 09/29/2013 1500   CO2 23 08/24/2013 1419   GLUCOSE 117 (H) 06/19/2017 0438   GLUCOSE 100 (H) 06/18/2017 0619   GLUCOSE 132 (H) 06/17/2017 0314   GLUCOSE 114 11/16/2013 1238   GLUCOSE 132 09/29/2013 1500   GLUCOSE 103 08/24/2013 1419   BUN 23 (H) 06/19/2017 0438   BUN 21 (H) 06/18/2017 0619   BUN 24 (H) 06/17/2017 0314   BUN 12.1 11/16/2013 1238   BUN 10.9 09/29/2013 1500   BUN 13.5 08/24/2013 1419   CREATININE 0.79 06/19/2017 0438   CREATININE 0.75 06/18/2017 0619   CREATININE 0.79 06/17/2017 0314   CREATININE 0.7 11/16/2013 1238   CREATININE 0.8 09/29/2013 1500   CREATININE 0.7 08/24/2013 1419   CALCIUM 8.7 (L) 06/19/2017 0438   CALCIUM 8.9 06/18/2017 0619   CALCIUM 9.2 06/17/2017 0314   CALCIUM 9.7 11/16/2013 1238   CALCIUM 9.6 09/29/2013 1500   CALCIUM 9.6 08/24/2013 1419   GFRNONAA >60 06/19/2017 0438   GFRNONAA >60 06/18/2017 0619   GFRNONAA >60 06/17/2017 0314   GFRAA >60 06/19/2017 0438   GFRAA >60 06/18/2017 0619   GFRAA >60 06/17/2017 0314   CBC    Component Value Date/Time   WBC 10.5 06/18/2017 0619   RBC 5.73 (H) 06/18/2017 0619   HGB 15.7 (H) 06/18/2017 0619   HGB 13.2 04/10/2014 1315   HCT 49.8 (H) 06/18/2017 0619   HCT  40.7 04/10/2014 1315   PLT 62 (L) 06/18/2017 0619   PLT 109 (L) 04/10/2014 1315   MCV 86.9 06/18/2017 0619   MCV 85.3 04/10/2014 1315   MCH 27.4 06/18/2017 0619   MCHC 31.5 06/18/2017 0619   RDW 15.1 06/18/2017 0619   RDW 15.1 (H) 04/10/2014 1315   LYMPHSABS 2.7 06/16/2017 0455   LYMPHSABS 1.4 04/10/2014 1315   MONOABS 1.0 06/16/2017 0455   MONOABS 0.7 04/10/2014 1315   EOSABS 0.5 06/16/2017 0455   EOSABS 0.6 (H) 04/10/2014 1315   BASOSABS 0.0 06/16/2017 0455   BASOSABS 0.1 04/10/2014 1315   HEPATIC Function Panel  Recent Labs  06/13/17 0424 06/16/17 0455 06/17/17 0314  PROT 5.9* 5.9* 6.3*   HEMOGLOBIN A1C No components found for: HGA1C,  MPG CARDIAC ENZYMES Lab Results  Component Value Date   TROPONINI <0.03 06/19/2017   TROPONINI <0.03 06/10/2017   TROPONINI <0.03 06/10/2017   BNP No  results for input(s): PROBNP in the last 8760 hours. TSH  Recent Labs  06/10/17 0028  TSH 1.678   CHOLESTEROL No results for input(s): CHOL in the last 8760 hours.  Scheduled Meds: . amLODipine  5 mg Oral Daily  . carvedilol  6.25 mg Oral BID WC  . doxycycline  100 mg Oral Q12H  . furosemide  80 mg Intravenous Q12H  . lisinopril  2.5 mg Oral Daily  . magnesium oxide  400 mg Oral Daily  . mouth rinse  15 mL Mouth Rinse BID  . saccharomyces boulardii  250 mg Oral BID   Continuous Infusions: PRN Meds:.acetaminophen **OR** acetaminophen, oxyCODONE-acetaminophen **AND** oxyCODONE, polyethylene glycol, sodium chloride, zolpidem  Assessment/Plan: Chest pain Acute on chronic left heart CHF with preserved LV systolic function Hypertrophic non-obstructive cardiomyopathy Morbid obesity Hypertension Idiopathic thrombocytopenia  Add lisinopril and Amlodipine to B-blocker therapy. Patient agrees to medical therapy.   LOS: 10 days    Dixie Dials  MD  06/19/2017, 11:45 AM

## 2017-06-19 NOTE — Progress Notes (Signed)
PROGRESS NOTE  [name] GMW:[id number] DOB: 12/03/1950 DOA: 06/09/2017 PCP: [name], NP   LOS: 10 days   Brief Narrative / Interim history: Patient is 66 year old female with hypertension, chronic back pain, ITP who is lost to follow-up, prior tobacco abuse who was admitted with progressive swelling, shortness of breath over the last several months.  Found herself unable to sleep in the bed and has been sleeping in a recliner, states that mainly this is due to the fact that her legs have become so swollen that she is unable to move them in bed.  Assessment & Plan: Principal Problem:   Acute congestive heart failure (HCC) Active Problems:   HTN (hypertension)   Idiopathic thrombocytopenic purpura (HCC)   Tobacco abuse   CHF (congestive heart failure) (HCC)   Acute diastolic congestive heart failure -Echo done in last week with grade 1 DD, severe LVH, EF was normal at 60-65% -She was maintained on Lasix IV BID, weight improved however when her Lasix was decreased to daily her weight went back up. Now again on Lasix BID, continue  Chest pain -new onset this morning, described pain as "tightness", non irradiating. Resolved within 5 minutes -re-consult cardiology, however she is not a candidate for invasive testing due to thrombocytopenia -cycle CEs, added ACEI and Norvasc per Dr. Doylene Canard, for now medical management  Acute hypoxic respiratory failure -Likely in the setting of #1, suspect obesity hypoventilation and potentially a degree of sleep apnea. -Wean off oxygen as tolerated, recommended weight loss and may benefit from outpatient evaluation for OSA -on room air now  Hypertension -Continue Coreg, Lasix, this was not treated at home as patient does not follow-up with a PCP regularly  Bilateral lower extremity venous stasis changes with chronic edema /possible cellulitis -Continue Unna boot, continue doxycycline for possible cellulitis 2 more days remaining    ITP -Platelets appear somewhat at baseline, outpatient follow-up  Tobacco abuse, in remission -She quit 1/2 years ago  Morbid obesity -Counseled for weight loss   DVT prophylaxis: SCD (thrombocytopenia) Code Status: Full code Family Communication: no family at bedside Disposition Plan: home 1 day  Consultants:   None   Procedures:   2D echo  Antimicrobials:  Doxycycline   Subjective: -chest pain this morning, "tightenss" and "like an elephant sitting on my chest". Resolved within 5 min. No dyspnea, no nausea with it  Objective: Vitals:   06/18/17 1646 06/18/17 2000 06/19/17 0300 06/19/17 0551  BP: (!) 142/71 134/65  (!) 148/68  Pulse: 78 79  82  Resp: 18 20  20   Temp: 98.6 F (37 C) 98.2 F (36.8 C)  98.7 F (37.1 C)  TempSrc: Oral Oral  Oral  SpO2: 91% 95%  98%  Weight:   92.5 kg (203 lb 14.4 oz)   Height:        Intake/Output Summary (Last 24 hours) at 06/19/17 1418 Last data filed at 06/19/17 4403  Gross per 24 hour  Intake              344 ml  Output             1351 ml  Net            -1007 ml   Filed Weights   06/17/17 0523 06/18/17 0617 06/19/17 0300  Weight: 94.2 kg (207 lb 9.6 oz) 93.2 kg (205 lb 8 oz) 92.5 kg (203 lb 14.4 oz)    Examination:  Constitutional: NAD ENMT: MMM Respiratory: CTA biL, no wheezing,  no crackles  Cardiovascular: RRR, no MRG, 1+ LE edema Abdomen: non tender, BS + Neurologic: no focal findings Psychiatric: AxOx3   Data Reviewed: I have independently reviewed following labs and imaging studies   CBC:  Recent Labs Lab 06/13/17 0424 06/15/17 0551 06/16/17 0455 06/18/17 0619  WBC 12.0* 11.3* 10.9* 10.5  NEUTROABS 8.4* 7.7 6.7  --   HGB 15.2* 14.8 15.4* 15.7*  HCT 49.5* 49.5* 49.6* 49.8*  MCV 88.4 88.2 88.1 86.9  PLT 58* 51* 51* 62*   Basic Metabolic Panel:  Recent Labs Lab 06/15/17 1247 06/16/17 0455 06/17/17 0314 06/18/17 0619 06/19/17 0438  NA 134* 134* 133* 135 132*  K 5.4* 5.3* 4.6  3.9 3.6  CL 93* 91* 88* 88* 89*  CO2 35* 37* 36* 37* 35*  GLUCOSE 122* 101* 132* 100* 117*  BUN 21* 24* 24* 21* 23*  CREATININE 0.74 0.83 0.79 0.75 0.79  CALCIUM 9.0 9.0 9.2 8.9 8.7*  MG  --   --   --  2.3  --    GFR: Estimated Creatinine Clearance: 69 mL/min (by C-G formula based on SCr of 0.79 mg/dL). Liver Function Tests:  Recent Labs Lab 06/13/17 0424 06/16/17 0455 06/17/17 0314  AST 13* 18 20  ALT 13* 21 21  ALKPHOS 59 68 86  BILITOT 1.1 1.0 0.8  PROT 5.9* 5.9* 6.3*  ALBUMIN 3.6 3.6 3.9   No results for input(s): LIPASE, AMYLASE in the last 168 hours. No results for input(s): AMMONIA in the last 168 hours. Coagulation Profile: No results for input(s): INR, PROTIME in the last 168 hours. Cardiac Enzymes:  Recent Labs Lab 06/19/17 0654 06/19/17 1211  TROPONINI <0.03 <0.03   BNP (last 3 results) No results for input(s): PROBNP in the last 8760 hours. HbA1C: No results for input(s): HGBA1C in the last 72 hours. CBG: No results for input(s): GLUCAP in the last 168 hours. Lipid Profile: No results for input(s): CHOL, HDL, LDLCALC, TRIG, CHOLHDL, LDLDIRECT in the last 72 hours. Thyroid Function Tests: No results for input(s): TSH, T4TOTAL, FREET4, T3FREE, THYROIDAB in the last 72 hours. Anemia Panel: No results for input(s): VITAMINB12, FOLATE, FERRITIN, TIBC, IRON, RETICCTPCT in the last 72 hours. Urine analysis:    Component Value Date/Time   COLORURINE STRAW (A) 06/09/2017 2044   APPEARANCEUR CLEAR 06/09/2017 2044   LABSPEC 1.005 06/09/2017 2044   PHURINE 6.0 06/09/2017 2044   GLUCOSEU NEGATIVE 06/09/2017 2044   HGBUR SMALL (A) 06/09/2017 2044   BILIRUBINUR NEGATIVE 06/09/2017 2044   KETONESUR NEGATIVE 06/09/2017 2044   PROTEINUR NEGATIVE 06/09/2017 2044   UROBILINOGEN 0.2 11/13/2014 0233   NITRITE NEGATIVE 06/09/2017 2044   LEUKOCYTESUR NEGATIVE 06/09/2017 2044   Sepsis Labs: Invalid input(s): PROCALCITONIN, LACTICIDVEN  No results found for this  or any previous visit (from the past 240 hour(s)).    Radiology Studies: No results found.   Scheduled Meds: . amLODipine  5 mg Oral Daily  . carvedilol  6.25 mg Oral BID WC  . doxycycline  100 mg Oral Q12H  . furosemide  80 mg Intravenous Q12H  . lisinopril  2.5 mg Oral Daily  . magnesium oxide  400 mg Oral Daily  . mouth rinse  15 mL Mouth Rinse BID  . saccharomyces boulardii  250 mg Oral BID   Continuous Infusions:   Marzetta Board, MD, PhD [facility] Pager [phone]  If 7PM-7AM, please contact night-coverage [email or web address] Password [email or web address] 06/19/2017, 2:18 PM

## 2017-06-20 DIAGNOSIS — Z720 Tobacco use: Secondary | ICD-10-CM

## 2017-06-20 LAB — BASIC METABOLIC PANEL
Anion gap: 11 (ref 5–15)
BUN: 28 mg/dL — ABNORMAL HIGH (ref 6–20)
CO2: 36 mmol/L — ABNORMAL HIGH (ref 22–32)
Calcium: 8.9 mg/dL (ref 8.9–10.3)
Chloride: 88 mmol/L — ABNORMAL LOW (ref 101–111)
Creatinine, Ser: 0.91 mg/dL (ref 0.44–1.00)
GFR calc Af Amer: >60 mL/min (ref >60)
GFR calc non Af Amer: >60 mL/min (ref >60)
Glucose, Bld: 114 mg/dL — ABNORMAL HIGH (ref 65–99)
Potassium: 3.5 mmol/L (ref 3.5–5.1)
Sodium: 135 mmol/L (ref 135–145)

## 2017-06-20 LAB — CBC
HCT: 45.3 % (ref 36.0–46.0)
Hemoglobin: 14.8 g/dL (ref 12.0–15.0)
MCH: 28.2 pg (ref 26.0–34.0)
MCHC: 32.7 g/dL (ref 30.0–36.0)
MCV: 86.5 fL (ref 78.0–100.0)
Platelets: 64 10*3/uL — ABNORMAL LOW (ref 150–400)
RBC: 5.24 MIL/uL — ABNORMAL HIGH (ref 3.87–5.11)
RDW: 15.2 % (ref 11.5–15.5)
WBC: 10.9 10*3/uL — ABNORMAL HIGH (ref 4.0–10.5)

## 2017-06-20 MED ORDER — CARVEDILOL 6.25 MG PO TABS: 6.2500 mg | ORAL_TABLET | Freq: Two times a day (BID) | ORAL | 1 refills | Status: DC

## 2017-06-20 MED ORDER — DOXYCYCLINE HYCLATE 100 MG PO TABS: 100.0000 mg | ORAL_TABLET | Freq: Two times a day (BID) | ORAL | 0 refills | Status: DC

## 2017-06-20 MED ORDER — AMLODIPINE BESYLATE 5 MG PO TABS: 5.0000 mg | ORAL_TABLET | Freq: Every day | ORAL | 1 refills | Status: DC

## 2017-06-20 MED ORDER — LISINOPRIL 2.5 MG PO TABS: 2.5000 mg | ORAL_TABLET | Freq: Every day | ORAL | 1 refills | Status: DC

## 2017-06-20 MED ORDER — FUROSEMIDE 80 MG PO TABS: 80.0000 mg | ORAL_TABLET | Freq: Every day | ORAL | 1 refills | Status: DC

## 2017-06-20 MED ORDER — POTASSIUM CHLORIDE ER 10 MEQ PO TBCR: 10.0000 meq | EXTENDED_RELEASE_TABLET | Freq: Every day | ORAL | 1 refills | Status: DC

## 2017-06-20 NOTE — Discharge Instructions (Signed)
Follow with Dr. Doylene Canard in 1 week  CHF  Discharge instructions  DIET  Low sodium Heart Healthy Diet with fluid restriction 1500cc/day  ACTIVITY  Avoid strenuous activity  WEIGH DAILY AT SAME TIME . CALL YOUR DOCTOR IF WEIGHT INCREASES BY MORE THAN 3  POUNDS IN 2 DAYS  CALL YOUR DOCTOR OR COME TO EMERGENCY ROOM IF WORSENING SHORTNESS OF BREATH OR  CHEST PAIN OR SWELLING  F/U with PMD in 1 week to recheck labs and adjust fluid pills as needed  If you smoke cigarettes or use any tobacco products you are advised to stop. Please ask nurse for any written materials  Or additional information you want regarding smoking cessation   Please get a complete blood count and chemistry panel checked by your Primary MD at your next visit, and again as instructed by your Primary MD. Please get your medications reviewed and adjusted by your Primary MD.  Please request your Primary MD to go over all Hospital Tests and Procedure/Radiological results at the follow up, please get all Hospital records sent to your Prim MD by signing hospital release before you go home.  If you had Pneumonia of Lung problems at the Hospital: Please get a 2 view Chest X ray done in 6-8 weeks after hospital discharge or sooner if instructed by your Primary MD.  If you have Congestive Heart Failure: Please call your Cardiologist or Primary MD anytime you have any of the following symptoms:  1) 3 pound weight gain in 24 hours or 5 pounds in 1 week  2) shortness of breath, with or without a dry hacking cough  3) swelling in the hands, feet or stomach  4) if you have to sleep on extra pillows at night in order to breathe  Follow cardiac low salt diet and 1.5 lit/day fluid restriction.  If you have diabetes Accuchecks 4 times/day, Once in AM empty stomach and then before each meal. Log in all results and show them to your primary doctor at your next visit. If any glucose reading is under 80 or above 300 call your primary MD  immediately.  If you have Seizure/Convulsions/Epilepsy: Please do not drive, operate heavy machinery, participate in activities at heights or participate in high speed sports until you have seen by Primary MD or a Neurologist and advised to do so again.  If you had Gastrointestinal Bleeding: Please ask your Primary MD to check a complete blood count within one week of discharge or at your next visit. Your endoscopic/colonoscopic biopsies that are pending at the time of discharge, will also need to followed by your Primary MD.  Get Medicines reviewed and adjusted. Please take all your medications with you for your next visit with your Primary MD  Please request your Primary MD to go over all hospital tests and procedure/radiological results at the follow up, please ask your Primary MD to get all Hospital records sent to his/her office.  If you experience worsening of your admission symptoms, develop shortness of breath, life threatening emergency, suicidal or homicidal thoughts you must seek medical attention immediately by calling 911 or calling your MD immediately  if symptoms less severe.  You must read complete instructions/literature along with all the possible adverse reactions/side effects for all the Medicines you take and that have been prescribed to you. Take any new Medicines after you have completely understood and accpet all the possible adverse reactions/side effects.   Do not drive or operate heavy machinery when taking Pain medications.  Do not take more than prescribed Pain, Sleep and Anxiety Medications  Special Instructions: If you have smoked or chewed Tobacco  in the last 2 yrs please stop smoking, stop any regular Alcohol  and or any Recreational drug use.  Wear Seat belts while driving.  Please note You were cared for by a hospitalist during your hospital stay. If you have any questions about your discharge medications or the care you received while you were in the  hospital after you are discharged, you can call the unit and asked to speak with the hospitalist on call if the hospitalist that took care of you is not available. Once you are discharged, your primary care physician will handle any further medical issues. Please note that NO REFILLS for any discharge medications will be authorized once you are discharged, as it is imperative that you return to your primary care physician (or establish a relationship with a primary care physician if you do not have one) for your aftercare needs so that they can reassess your need for medications and monitor your lab values.  You can reach the hospitalist office at phone [phone] or fax [phone]   If you do not have a primary care physician, you can call [phone] for a physician referral.  Activity: As tolerated with Full fall precautions use walker/cane & assistance as needed  Diet: low salt, heart healthy  Disposition Home

## 2017-06-20 NOTE — Progress Notes (Signed)
Reviewed discharge instructions/medications with patient. Answered her questions. Patient is stable and ready for discharge. Patient is waiting on ride.

## 2017-06-20 NOTE — Progress Notes (Signed)
Ortho tech at bedside, aware to wrap left leg only as per MD Renne Crigler

## 2017-06-20 NOTE — Discharge Summary (Signed)
Physician Discharge Summary  [name] DGU:[id number] DOB: 03/17/1951 DOA: 06/09/2017  PCP: [name], NP  Admit date: 06/09/2017 Discharge date: 06/20/2017  Admitted From: home Disposition:  home  Recommendations for Outpatient Follow-up:  1. Follow up with Dr. Doylene Canard in 1-2 weeks 2. Daily weights on d/c  Home Health: PT, RN Equipment/Devices: 3 in 1   Discharge Condition: stable CODE STATUS: Full code Diet recommendation: low salt, heart healthy  HPI: Per Dr. Hal Hope, [name] is a 66 y.o. female with history of hypertension chronic low back pain diopathic thrombocytopenia many purpura has not been to a physician for long time presents to the ER with complaints of worsening shortness of breath and increasing lower extremity edema. Patient has been having these symptoms for last many months but since symptoms have acutely worsened with fluid draining out of the legs patient decided to come to the ER. Denies any fever chills chest pain or productive cough. ED Course: in the ER on exam patient has significant edema up to the thighs bilaterally.JVD is difficult to assess. Mild crepitations bilaterally. Chest x-ray shows cardiomegaly. EKG shows normal sinus rhythm with BNP of 477. Patient was given Lasix 40 mg IV in the ER and I have added another dose of 40 mg IV. Patient admitted for CHF.  Hospital Course: Discharge Diagnoses:  Principal Problem:   Acute congestive heart failure (HCC) Active Problems:   HTN (hypertension)   Idiopathic thrombocytopenic purpura (HCC)   Tobacco abuse   CHF (congestive heart failure) (HCC)   Acute diastolic congestive heart failure -recent was admitted to the hospital with acute on chronic diastolic CHF.  She has been having progressive swelling over the last several months.  Echo done showed grade 1 DD, severe LVH, EF was normal at 60-65%.  Patient was started on Lasix twice daily, she is net -13.4 L and her weight has  improved from 216 on admission to 204 on discharge.  Her kidney function has remained stable, with BUN steadily climbing as expected with diuresis.  She was transitioned to p.o. Lasix, and was discharged home in stable condition, advised to follow-up within a week with her primary cardiologist Dr. Doylene Canard. Chest pain -on 10/5, patient had an episode of chest pain, described as tightness.  It resolved within 5 minutes.  Cardiac enzymes were negative x3.  Cardiology Dr. Doylene Canard evaluated patient, and given thrombocytopenia she is not a candidate for invasive testing and recommended medical management for now.  Chest pain resolved Acute hypoxic respiratory failure -Likely in the setting of #1, suspect obesity hypoventilation and potentially a degree of sleep apnea.  Patient was able to be weaned off to room air.  Recommend outpatient referral to sleep study as well as weight loss Hypertension -Continue regimen as below, this was not treated at home as patient does not follow-up with a PCP regularly  Bilateral lower extremity venous stasis changes with chronic edema /possible cellulitis -Continue Unna boot, continue doxycycline for possible cellulitis 2 more days remaining  ITP -Platelets appear somewhat at baseline, outpatient follow-up Tobacco abuse, in remission -She quit 1/2 years ago Morbid obesity -Counseled for weight loss   Discharge Instructions   Allergies as of 06/20/2017   No Known Allergies     Medication List    STOP taking these medications   diphenhydramine-acetaminophen 25-500 MG Tabs tablet Commonly known as:  TYLENOL PM   methocarbamol 500 MG tablet Commonly known as:  ROBAXIN   morphine 15 MG tablet Commonly known  as:  MSIR   oxyCODONE-acetaminophen 10-325 MG tablet Commonly known as:  PERCOCET   oxyCODONE-acetaminophen 5-325 MG tablet Commonly known as:  PERCOCET/ROXICET     TAKE these medications   acetaminophen 500 MG tablet Commonly known as:  TYLENOL Take  1,000 mg by mouth every 6 (six) hours as needed for moderate pain.   amLODipine 5 MG tablet Commonly known as:  NORVASC Take 1 tablet (5 mg total) by mouth daily.   carvedilol 6.25 MG tablet Commonly known as:  COREG Take 1 tablet (6.25 mg total) by mouth 2 (two) times daily with a meal.   doxycycline 100 MG tablet Commonly known as:  VIBRA-TABS Take 1 tablet (100 mg total) by mouth every 12 (twelve) hours.   folic acid 1 MG tablet Commonly known as:  FOLVITE Take 1 tablet (1 mg total) by mouth daily.   furosemide 80 MG tablet Commonly known as:  LASIX Take 1 tablet (80 mg total) by mouth daily.   lisinopril 2.5 MG tablet Commonly known as:  PRINIVIL,ZESTRIL Take 1 tablet (2.5 mg total) by mouth daily.   polyethylene glycol packet Commonly known as:  MIRALAX / GLYCOLAX Take 17 g by mouth daily. What changed:  when to take this  reasons to take this   potassium chloride 10 MEQ tablet Commonly known as:  K-DUR Take 1 tablet (10 mEq total) by mouth daily.   vitamin B-12 1000 MCG tablet Commonly known as:  CYANOCOBALAMIN Take 1 tablet (1,000 mcg total) by mouth daily.            Durable Medical Equipment        Start     Ordered   06/20/17 1002  For home use only DME 3 n 1  Once     06/20/17 1002   06/15/17 1104  For home use only DME Cane  Once     06/15/17 1104     Follow-up Information    Dixie Dials, MD. Schedule an appointment as soon as possible for a visit in 1 week(s).   Specialty:  Cardiology Contact information: [place] [phone]           Consultations:  Cardiology   Procedures/Studies:  2D echo  Study Conclusions - Left ventricle: The cavity size was normal. Wall thickness was increased in a pattern of severe LVH. Systolic function was normal. The estimated ejection fraction was in the range of 60% to 65%. Doppler parameters are consistent with abnormal left ventricular relaxation (grade 1  diastolic dysfunction). - Aortic valve: There was mild regurgitation. - Atrial septum: No defect or patent foramen ovale was identified. - Pulmonary arteries: PA peak pressure: 37 mm Hg (S). - Pericardium, extracardiac: Small localized posterior pericardial effusion.   Dg Chest 2 View  Result Date: 06/09/2017 CLINICAL DATA:  Redness of breath, BILATERAL leg swelling for 1 month, history hypertension, former smoker EXAM: CHEST  2 VIEW COMPARISON:  03/31/2014 FINDINGS: Enlargement of cardiac silhouette. Mediastinal contours and pulmonary vascularity normal. Mild subsegmental atelectasis at lower lungs. No definite infiltrate or edema. No pleural effusion or pneumothorax. Bones diffusely demineralized. IMPRESSION: Enlargement of cardiac silhouette with bibasilar atelectasis. Electronically Signed   By: Lavonia Dana M.D.   On: 06/09/2017 18:05     Subjective: - no chest pain, shortness of breath, no abdominal pain, nausea or vomiting.   Discharge Exam: Vitals:   06/20/17 0534 06/20/17 0920  BP: (!) 144/51 (!) 113/98  Pulse: 77 81  Resp:  18   Temp: 97.8 F (36.6 C)   SpO2: 94% 92%    General: Pt is alert, awake, not in acute distress Cardiovascular: RRR, S1/S2 +, no rubs, no gallops, 1+ LE edema Respiratory: CTA bilaterally, no wheezing, no rhonchi Abdominal: Soft, NT, ND, bowel sounds + Extremities: no edema, no cyanosis    The results of significant diagnostics from this hospitalization (including imaging, microbiology, ancillary and laboratory) are listed below for reference.     Microbiology: No results found for this or any previous visit (from the past 240 hour(s)).   Labs: BNP (last 3 results)  Recent Labs  06/09/17 1726  BNP 413.2*   Basic Metabolic Panel:  Recent Labs Lab 06/16/17 0455 06/17/17 0314 06/18/17 0619 06/19/17 0438 06/20/17 0222  NA 134* 133* 135 132* 135  K 5.3* 4.6 3.9 3.6 3.5  CL 91* 88* 88* 89* 88*  CO2 37* 36* 37* 35* 36*  GLUCOSE 101*  132* 100* 117* 114*  BUN 24* 24* 21* 23* 28*  CREATININE 0.83 0.79 0.75 0.79 0.91  CALCIUM 9.0 9.2 8.9 8.7* 8.9  MG  --   --  2.3  --   --    Liver Function Tests:  Recent Labs Lab 06/16/17 0455 06/17/17 0314  AST 18 20  ALT 21 21  ALKPHOS 68 86  BILITOT 1.0 0.8  PROT 5.9* 6.3*  ALBUMIN 3.6 3.9   No results for input(s): LIPASE, AMYLASE in the last 168 hours. No results for input(s): AMMONIA in the last 168 hours. CBC:  Recent Labs Lab 06/15/17 0551 06/16/17 0455 06/18/17 0619 06/20/17 0222  WBC 11.3* 10.9* 10.5 10.9*  NEUTROABS 7.7 6.7  --   --   HGB 14.8 15.4* 15.7* 14.8  HCT 49.5* 49.6* 49.8* 45.3  MCV 88.2 88.1 86.9 86.5  PLT 51* 51* 62* 64*   Cardiac Enzymes:  Recent Labs Lab 06/19/17 0654 06/19/17 1211 06/19/17 1818  TROPONINI <0.03 <0.03 <0.03   BNP: Invalid input(s): POCBNP CBG: No results for input(s): GLUCAP in the last 168 hours. D-Dimer No results for input(s): DDIMER in the last 72 hours. Hgb A1c No results for input(s): HGBA1C in the last 72 hours. Lipid Profile No results for input(s): CHOL, HDL, LDLCALC, TRIG, CHOLHDL, LDLDIRECT in the last 72 hours. Thyroid function studies No results for input(s): TSH, T4TOTAL, T3FREE, THYROIDAB in the last 72 hours.  Invalid input(s): FREET3 Anemia work up No results for input(s): VITAMINB12, FOLATE, FERRITIN, TIBC, IRON, RETICCTPCT in the last 72 hours. Urinalysis    Component Value Date/Time   COLORURINE STRAW (A) 06/09/2017 2044   APPEARANCEUR CLEAR 06/09/2017 2044   LABSPEC 1.005 06/09/2017 2044   PHURINE 6.0 06/09/2017 2044   GLUCOSEU NEGATIVE 06/09/2017 2044   HGBUR SMALL (A) 06/09/2017 2044   BILIRUBINUR NEGATIVE 06/09/2017 2044   KETONESUR NEGATIVE 06/09/2017 2044   PROTEINUR NEGATIVE 06/09/2017 2044   UROBILINOGEN 0.2 11/13/2014 0233   NITRITE NEGATIVE 06/09/2017 2044   LEUKOCYTESUR NEGATIVE 06/09/2017 2044   Sepsis Labs Invalid input(s): PROCALCITONIN,  WBC,   LACTICIDVEN   Time coordinating discharge: 35 minutes  SIGNED:  Marzetta Board, MD  [facility] 06/20/2017, 3:25 PM Pager [phone]  If 7PM-7AM, please contact night-coverage [email or web address] Password [email or web address]

## 2017-06-20 NOTE — Progress Notes (Signed)
Patient has 3-in-1 and cane. Was delivered to the room

## 2017-06-20 NOTE — Progress Notes (Addendum)
Patient is for dc today, with [facility] with HHRN, PT, and a cane, she decided she did not want the 3 n 1.  Jermaine to bring cane to her room.   10:30 NCM received call from RN , stating patient has changed her mind and would like the 3 n 1 now.  NCM informed Jermaine to bring with the cane.

## 2017-06-20 NOTE — Progress Notes (Signed)
MD at bedside requests unna boots to be taken off for evaluation RN removed unna boots MD stated to place unna boots back on only on pt left leg Called orthopedics to place  Pt states she would like the 3 in 1 which she previously denied to case management  Called case management to inform pt needs 3 in 1 and cane  Case manager aware

## 2018-09-29 ENCOUNTER — Encounter (HOSPITAL_COMMUNITY): Payer: Self-pay | Admitting: Emergency Medicine

## 2018-09-29 ENCOUNTER — Other Ambulatory Visit: Payer: Self-pay

## 2018-09-29 ENCOUNTER — Emergency Department (HOSPITAL_COMMUNITY): Payer: Medicare Other

## 2018-09-29 ENCOUNTER — Inpatient Hospital Stay (HOSPITAL_COMMUNITY)
Admission: EM | Admit: 2018-09-29 | Discharge: 2018-10-07 | DRG: 291 | Disposition: A | Discharge status: Home Health | Payer: Medicare Other | Attending: Internal Medicine | Admitting: Internal Medicine

## 2018-09-29 DIAGNOSIS — R600 Localized edema: Secondary | ICD-10-CM | POA: Diagnosis present

## 2018-09-29 DIAGNOSIS — Z8249 Family history of ischemic heart disease and other diseases of the circulatory system: Secondary | ICD-10-CM | POA: Diagnosis not present

## 2018-09-29 DIAGNOSIS — K219 Gastro-esophageal reflux disease without esophagitis: Secondary | ICD-10-CM | POA: Diagnosis present

## 2018-09-29 DIAGNOSIS — K5900 Constipation, unspecified: Secondary | ICD-10-CM | POA: Diagnosis not present

## 2018-09-29 DIAGNOSIS — J9612 Chronic respiratory failure with hypercapnia: Secondary | ICD-10-CM

## 2018-09-29 DIAGNOSIS — Z79899 Other long term (current) drug therapy: Secondary | ICD-10-CM

## 2018-09-29 DIAGNOSIS — I96 Gangrene, not elsewhere classified: Secondary | ICD-10-CM | POA: Diagnosis not present

## 2018-09-29 DIAGNOSIS — L03119 Cellulitis of unspecified part of limb: Secondary | ICD-10-CM

## 2018-09-29 DIAGNOSIS — M62838 Other muscle spasm: Secondary | ICD-10-CM | POA: Diagnosis present

## 2018-09-29 DIAGNOSIS — Z806 Family history of leukemia: Secondary | ICD-10-CM

## 2018-09-29 DIAGNOSIS — I872 Venous insufficiency (chronic) (peripheral): Secondary | ICD-10-CM | POA: Diagnosis present

## 2018-09-29 DIAGNOSIS — J9621 Acute and chronic respiratory failure with hypoxia: Secondary | ICD-10-CM | POA: Diagnosis present

## 2018-09-29 DIAGNOSIS — I5082 Biventricular heart failure: Secondary | ICD-10-CM | POA: Diagnosis present

## 2018-09-29 DIAGNOSIS — M4850XS Collapsed vertebra, not elsewhere classified, site unspecified, sequela of fracture: Secondary | ICD-10-CM | POA: Diagnosis not present

## 2018-09-29 DIAGNOSIS — Y92239 Unspecified place in hospital as the place of occurrence of the external cause: Secondary | ICD-10-CM | POA: Diagnosis not present

## 2018-09-29 DIAGNOSIS — K5903 Drug induced constipation: Secondary | ICD-10-CM | POA: Diagnosis not present

## 2018-09-29 DIAGNOSIS — G8921 Chronic pain due to trauma: Secondary | ICD-10-CM | POA: Diagnosis present

## 2018-09-29 DIAGNOSIS — L03115 Cellulitis of right lower limb: Secondary | ICD-10-CM | POA: Diagnosis present

## 2018-09-29 DIAGNOSIS — M810 Age-related osteoporosis without current pathological fracture: Secondary | ICD-10-CM | POA: Diagnosis present

## 2018-09-29 DIAGNOSIS — E872 Acidosis: Secondary | ICD-10-CM | POA: Diagnosis present

## 2018-09-29 DIAGNOSIS — M545 Low back pain: Secondary | ICD-10-CM | POA: Diagnosis present

## 2018-09-29 DIAGNOSIS — M1990 Unspecified osteoarthritis, unspecified site: Secondary | ICD-10-CM | POA: Diagnosis present

## 2018-09-29 DIAGNOSIS — M62830 Muscle spasm of back: Secondary | ICD-10-CM | POA: Diagnosis not present

## 2018-09-29 DIAGNOSIS — Z79891 Long term (current) use of opiate analgesic: Secondary | ICD-10-CM | POA: Diagnosis not present

## 2018-09-29 DIAGNOSIS — G8929 Other chronic pain: Secondary | ICD-10-CM

## 2018-09-29 DIAGNOSIS — I110 Hypertensive heart disease with heart failure: Secondary | ICD-10-CM | POA: Diagnosis not present

## 2018-09-29 DIAGNOSIS — Z87891 Personal history of nicotine dependence: Secondary | ICD-10-CM

## 2018-09-29 DIAGNOSIS — I5031 Acute diastolic (congestive) heart failure: Secondary | ICD-10-CM | POA: Diagnosis present

## 2018-09-29 DIAGNOSIS — I5033 Acute on chronic diastolic (congestive) heart failure: Secondary | ICD-10-CM | POA: Diagnosis present

## 2018-09-29 DIAGNOSIS — R21 Rash and other nonspecific skin eruption: Secondary | ICD-10-CM | POA: Diagnosis present

## 2018-09-29 DIAGNOSIS — N179 Acute kidney failure, unspecified: Secondary | ICD-10-CM | POA: Diagnosis not present

## 2018-09-29 DIAGNOSIS — I10 Essential (primary) hypertension: Secondary | ICD-10-CM | POA: Diagnosis present

## 2018-09-29 DIAGNOSIS — D751 Secondary polycythemia: Secondary | ICD-10-CM | POA: Diagnosis present

## 2018-09-29 DIAGNOSIS — D693 Immune thrombocytopenic purpura: Secondary | ICD-10-CM | POA: Diagnosis present

## 2018-09-29 DIAGNOSIS — S81809A Unspecified open wound, unspecified lower leg, initial encounter: Secondary | ICD-10-CM | POA: Diagnosis present

## 2018-09-29 DIAGNOSIS — I878 Other specified disorders of veins: Secondary | ICD-10-CM | POA: Diagnosis present

## 2018-09-29 DIAGNOSIS — Z87442 Personal history of urinary calculi: Secondary | ICD-10-CM

## 2018-09-29 DIAGNOSIS — T402X5A Adverse effect of other opioids, initial encounter: Secondary | ICD-10-CM | POA: Diagnosis not present

## 2018-09-29 DIAGNOSIS — M549 Dorsalgia, unspecified: Secondary | ICD-10-CM

## 2018-09-29 DIAGNOSIS — F329 Major depressive disorder, single episode, unspecified: Secondary | ICD-10-CM | POA: Diagnosis present

## 2018-09-29 DIAGNOSIS — Z6841 Body Mass Index (BMI) 40.0 and over, adult: Secondary | ICD-10-CM

## 2018-09-29 DIAGNOSIS — I272 Pulmonary hypertension, unspecified: Secondary | ICD-10-CM | POA: Diagnosis not present

## 2018-09-29 DIAGNOSIS — J9622 Acute and chronic respiratory failure with hypercapnia: Secondary | ICD-10-CM | POA: Diagnosis present

## 2018-09-29 DIAGNOSIS — E662 Morbid (severe) obesity with alveolar hypoventilation: Secondary | ICD-10-CM | POA: Diagnosis present

## 2018-09-29 DIAGNOSIS — I2729 Other secondary pulmonary hypertension: Secondary | ICD-10-CM | POA: Diagnosis present

## 2018-09-29 DIAGNOSIS — L03116 Cellulitis of left lower limb: Secondary | ICD-10-CM | POA: Diagnosis not present

## 2018-09-29 DIAGNOSIS — N189 Chronic kidney disease, unspecified: Secondary | ICD-10-CM | POA: Diagnosis not present

## 2018-09-29 DIAGNOSIS — J9611 Chronic respiratory failure with hypoxia: Secondary | ICD-10-CM | POA: Diagnosis present

## 2018-09-29 DIAGNOSIS — I130 Hypertensive heart and chronic kidney disease with heart failure and stage 1 through stage 4 chronic kidney disease, or unspecified chronic kidney disease: Secondary | ICD-10-CM | POA: Diagnosis not present

## 2018-09-29 DIAGNOSIS — I351 Nonrheumatic aortic (valve) insufficiency: Secondary | ICD-10-CM | POA: Diagnosis present

## 2018-09-29 DIAGNOSIS — Z981 Arthrodesis status: Secondary | ICD-10-CM

## 2018-09-29 DIAGNOSIS — I50810 Right heart failure, unspecified: Secondary | ICD-10-CM | POA: Diagnosis not present

## 2018-09-29 DIAGNOSIS — Z90722 Acquired absence of ovaries, bilateral: Secondary | ICD-10-CM

## 2018-09-29 DIAGNOSIS — Z90710 Acquired absence of both cervix and uterus: Secondary | ICD-10-CM

## 2018-09-29 LAB — CBC WITH DIFFERENTIAL/PLATELET
Abs Immature Granulocytes: 0.09 10*3/uL — ABNORMAL HIGH (ref 0.00–0.07)
Basophils Absolute: 0 10*3/uL (ref 0.0–0.1)
Basophils Relative: 0 %
Eosinophils Absolute: 0.5 10*3/uL (ref 0.0–0.5)
Eosinophils Relative: 4 %
HCT: 53.2 % — ABNORMAL HIGH (ref 36.0–46.0)
Hemoglobin: 16.3 g/dL — ABNORMAL HIGH (ref 12.0–15.0)
Immature Granulocytes: 1 %
Lymphocytes Relative: 12 %
Lymphs Abs: 1.3 10*3/uL (ref 0.7–4.0)
MCH: 28 pg (ref 26.0–34.0)
MCHC: 30.6 g/dL (ref 30.0–36.0)
MCV: 91.3 fL (ref 80.0–100.0)
Monocytes Absolute: 0.6 10*3/uL (ref 0.1–1.0)
Monocytes Relative: 6 %
Neutro Abs: 8.8 10*3/uL — ABNORMAL HIGH (ref 1.7–7.7)
Neutrophils Relative %: 77 %
Platelets: 52 10*3/uL — ABNORMAL LOW (ref 150–400)
RBC: 5.83 MIL/uL — ABNORMAL HIGH (ref 3.87–5.11)
RDW: 17.1 % — ABNORMAL HIGH (ref 11.5–15.5)
WBC: 11.3 10*3/uL — ABNORMAL HIGH (ref 4.0–10.5)
nRBC: 0 % (ref 0.0–0.2)

## 2018-09-29 LAB — COMPREHENSIVE METABOLIC PANEL
ALT: 17 U/L (ref 0–44)
AST: 14 U/L — ABNORMAL LOW (ref 15–41)
Albumin: 3.7 g/dL (ref 3.5–5.0)
Alkaline Phosphatase: 81 U/L (ref 38–126)
Anion gap: 9 (ref 5–15)
BUN: 25 mg/dL — ABNORMAL HIGH (ref 8–23)
CO2: 31 mmol/L (ref 22–32)
Calcium: 9 mg/dL (ref 8.9–10.3)
Chloride: 99 mmol/L (ref 98–111)
Creatinine, Ser: 0.96 mg/dL (ref 0.44–1.00)
GFR calc Af Amer: >60 mL/min (ref >60)
GFR calc non Af Amer: >60 mL/min (ref >60)
Glucose, Bld: 112 mg/dL — ABNORMAL HIGH (ref 70–99)
Potassium: 4.1 mmol/L (ref 3.5–5.1)
Sodium: 139 mmol/L (ref 135–145)
Total Bilirubin: 0.9 mg/dL (ref 0.3–1.2)
Total Protein: 6.3 g/dL — ABNORMAL LOW (ref 6.5–8.1)

## 2018-09-29 LAB — PROTIME-INR
INR: 1.02
Prothrombin Time: 13.3 seconds (ref 11.4–15.2)

## 2018-09-29 LAB — TROPONIN I: Troponin I: <0.03 ng/mL (ref <0.03)

## 2018-09-29 LAB — URINALYSIS, ROUTINE W REFLEX MICROSCOPIC
Bilirubin Urine: NEGATIVE
Glucose, UA: NEGATIVE mg/dL
Hgb urine dipstick: NEGATIVE
Ketones, ur: NEGATIVE mg/dL
Leukocytes, UA: NEGATIVE
Nitrite: NEGATIVE
Protein, ur: NEGATIVE mg/dL
Specific Gravity, Urine: 1.026 (ref 1.005–1.030)
pH: 5 (ref 5.0–8.0)

## 2018-09-29 LAB — I-STAT CG4 LACTIC ACID, ED: Lactic Acid, Venous: 0.85 mmol/L (ref 0.5–1.9)

## 2018-09-29 LAB — BRAIN NATRIURETIC PEPTIDE: B Natriuretic Peptide: 34.7 pg/mL (ref 0.0–100.0)

## 2018-09-29 MED ORDER — ACETAMINOPHEN 325 MG PO TABS
650.0000 mg | ORAL_TABLET | Freq: Four times a day (QID) | ORAL | Status: DC | PRN
  Administered 2018-09-30: 650 mg via ORAL
  Filled 2018-09-29: qty 2

## 2018-09-29 MED ORDER — POLYETHYLENE GLYCOL 3350 17 G PO PACK
17.0000 g | PACK | Freq: Every day | ORAL | Status: DC | PRN
  Administered 2018-10-01 – 2018-10-04 (×4): 17 g via ORAL
  Filled 2018-09-29 (×4): qty 1

## 2018-09-29 MED ORDER — DOXYCYCLINE HYCLATE 100 MG PO TABS
100.0000 mg | ORAL_TABLET | Freq: Once | ORAL | Status: AC
  Administered 2018-09-29: 100 mg via ORAL
  Filled 2018-09-29: qty 1

## 2018-09-29 MED ORDER — METHOCARBAMOL 500 MG PO TABS
500.0000 mg | ORAL_TABLET | Freq: Four times a day (QID) | ORAL | Status: DC | PRN
  Administered 2018-09-29 – 2018-09-30 (×2): 500 mg via ORAL
  Filled 2018-09-29 (×2): qty 1

## 2018-09-29 MED ORDER — LISINOPRIL 5 MG PO TABS
2.5000 mg | ORAL_TABLET | Freq: Every day | ORAL | Status: DC
  Administered 2018-09-30 – 2018-10-07 (×7): 2.5 mg via ORAL
  Filled 2018-09-29 (×7): qty 1

## 2018-09-29 MED ORDER — OXYCODONE-ACETAMINOPHEN 10-325 MG PO TABS: 1.0000 | ORAL_TABLET | Freq: Three times a day (TID) | ORAL | Status: DC | PRN

## 2018-09-29 MED ORDER — OXYCODONE-ACETAMINOPHEN 5-325 MG PO TABS
2.0000 | ORAL_TABLET | Freq: Three times a day (TID) | ORAL | Status: DC | PRN
  Administered 2018-09-29 – 2018-10-07 (×21): 2 via ORAL
  Filled 2018-09-29 (×21): qty 2

## 2018-09-29 MED ORDER — OXYCODONE HCL 5 MG PO TABS: 5.0000 mg | ORAL_TABLET | Freq: Three times a day (TID) | ORAL | Status: DC | PRN

## 2018-09-29 MED ORDER — FUROSEMIDE 10 MG/ML IJ SOLN
60.0000 mg | Freq: Two times a day (BID) | INTRAMUSCULAR | Status: DC
  Administered 2018-09-29: 60 mg via INTRAVENOUS
  Filled 2018-09-29: qty 6

## 2018-09-29 MED ORDER — CEFAZOLIN SODIUM-DEXTROSE 2-4 GM/100ML-% IV SOLN
2.0000 g | Freq: Three times a day (TID) | INTRAVENOUS | Status: DC
  Administered 2018-09-30 – 2018-10-01 (×5): 2 g via INTRAVENOUS
  Filled 2018-09-29 (×6): qty 100

## 2018-09-29 MED ORDER — CARVEDILOL 6.25 MG PO TABS
6.2500 mg | ORAL_TABLET | Freq: Two times a day (BID) | ORAL | Status: DC
  Administered 2018-09-29 – 2018-10-07 (×13): 6.25 mg via ORAL
  Filled 2018-09-29 (×16): qty 1

## 2018-09-29 MED ORDER — ACETAMINOPHEN 650 MG RE SUPP: 650.0000 mg | Freq: Four times a day (QID) | RECTAL | Status: DC | PRN

## 2018-09-29 MED ORDER — AMLODIPINE BESYLATE 2.5 MG PO TABS
5.0000 mg | ORAL_TABLET | Freq: Every day | ORAL | Status: DC
  Administered 2018-09-30: 5 mg via ORAL
  Filled 2018-09-29: qty 2

## 2018-09-29 MED ORDER — SODIUM CHLORIDE 0.9% FLUSH
3.0000 mL | Freq: Once | INTRAVENOUS | Status: AC
  Administered 2018-09-29: 3 mL via INTRAVENOUS

## 2018-09-29 MED ORDER — OXYCODONE-ACETAMINOPHEN 5-325 MG PO TABS: 1.0000 | ORAL_TABLET | Freq: Three times a day (TID) | ORAL | Status: DC | PRN

## 2018-09-29 NOTE — ED Notes (Signed)
Per MD Nanavati, leave legs unwrapped for a little while and then apply a dry dressing.

## 2018-09-29 NOTE — ED Notes (Signed)
Pt placed on purewick 

## 2018-09-29 NOTE — ED Provider Notes (Signed)
[facility] EMERGENCY DEPARTMENT Provider Note   CSN: [id number] Arrival date & time: 09/29/18  1244     History   Chief Complaint Chief Complaint  Patient presents with  . Leg Swelling    HPI [name] is a 68 y.o. female.  HPI 68 year old female comes in with chief complaint of leg swelling.  She has history of CKD, CHF and venous stasis.   Past Medical History:  Diagnosis Date  . Arthritis    lumbar burst fx, osteoporosis  . Back pain 09/29/2013  . Blood dyscrasia   . Chest pain    denies  . Chronic kidney disease    passed stones spontaneously- 40 yrs. ago   . Depression    states her current situation with activity limitations, she has a low spirit at times.   Marland Kitchen Dysrhythmia    occ tachycardia  . Fracture of vertebra, compression ([place])   . GERD (gastroesophageal reflux disease)    occ  . History of kidney stones   . HTN (hypertension)   . ITP (idiopathic thrombocytopenic purpura) 08/29/2013  . Leukocytosis, unspecified 09/05/2013  . Muscle cramp 11/16/2013  . Tachycardia    occ upon exersion, and when put on bp med    Patient Active Problem List   Diagnosis Date Noted  . Acute congestive heart failure ([place]) 06/09/2017  . CHF (congestive heart failure) ([place]) 06/09/2017  . SBO (small bowel obstruction) ([place])   . Leukocytosis 11/13/2014  . SOB (shortness of breath) 11/13/2014  . Abdominal pain 11/13/2014  . Sepsis ([place]) 11/13/2014  . GERD (gastroesophageal reflux disease) 11/13/2014  . Constipation 11/13/2014  . AP (abdominal pain)   . T12 compression fracture ([place]) 03/28/2014  . Tobacco abuse 03/10/2014  . Muscle cramp 11/16/2013  . Back pain 09/29/2013  . Idiopathic thrombocytopenic purpura ([place]) 08/29/2013  . Chest pain   . HTN (hypertension)     Past Surgical History:  Procedure Laterality Date  . ABDOMINAL HYSTERECTOMY    . ANTERIOR LAT LUMBAR FUSION N/A 03/28/2014   Procedure: Anterolateral decompression of L1  fracture with posterior segmental stabilization;  Surgeon: Kristeen Miss, MD;  Location: [facility];  Service: Neurosurgery;  Laterality: N/A;  Anterolateral decompression of Lumbar One fracture with posterior segmental stabilization  . CESAREAN SECTION     x2  . CHEST TUBE INSERTION Left 03/28/2014   Procedure: CHEST TUBE INSERTION;  Surgeon: Ivin Poot, MD;  Location: [facility];  Service: Thoracic;  Laterality: Left;  . INCISIONAL HERNIA REPAIR N/A 11/13/2014   Procedure: PRIMARY REPAIR INCARCERATED INCISIONAL HERNIA WITH PLACEMENT BIOLOGIC MESH;  Surgeon: Rolm Bookbinder, MD;  Location: [facility];  Service: General;  Laterality: N/A;  . LAPAROTOMY Right 09   ovarian cyst  . TOTAL ABDOMINAL HYSTERECTOMY W/ BILATERAL SALPINGOOPHORECTOMY  97     OB History   No obstetric history on file.      Home Medications    Prior to Admission medications   Medication Sig Start Date End Date Taking? Authorizing Provider  amLODipine (NORVASC) 5 MG tablet Take 1 tablet (5 mg total) by mouth daily. 06/21/17  Yes Caren Griffins, MD  carvedilol (COREG) 6.25 MG tablet Take 1 tablet (6.25 mg total) by mouth 2 (two) times daily with a meal. 06/20/17  Yes Gherghe, Vella Redhead, MD  diphenhydramine-acetaminophen (TYLENOL PM) 25-500 MG TABS tablet Take 1 tablet by mouth at bedtime as needed (sleep).   Yes [provider]  furosemide (LASIX) 80 MG  tablet Take 1 tablet (80 mg total) by mouth daily. 06/20/17 09/29/18 Yes Gherghe, Vella Redhead, MD  lisinopril (PRINIVIL,ZESTRIL) 2.5 MG tablet Take 1 tablet (2.5 mg total) by mouth daily. 06/21/17  Yes Gherghe, Vella Redhead, MD  methocarbamol (ROBAXIN) 500 MG tablet Take 500 mg by mouth every 6 (six) hours as needed for muscle spasms.   Yes [provider]  oxyCODONE-acetaminophen (PERCOCET) 10-325 MG tablet Take 1 tablet by mouth 3 (three) times daily as needed for pain.   Yes [provider]  polyethylene glycol (MIRALAX / GLYCOLAX) packet Take 17  g by mouth daily. Patient taking differently: Take 17 g by mouth daily as needed.  11/17/14  Yes Riebock, Emina, NP  potassium chloride (K-DUR) 10 MEQ tablet Take 1 tablet (10 mEq total) by mouth daily. 06/20/17  Yes Gherghe, Vella Redhead, MD  doxycycline (VIBRA-TABS) 100 MG tablet Take 1 tablet (100 mg total) by mouth every 12 (twelve) hours. Patient not taking: Reported on 09/29/2018 06/20/17   Caren Griffins, MD  folic acid (FOLVITE) 1 MG tablet Take 1 tablet (1 mg total) by mouth daily. Patient not taking: Reported on 11/13/2014 09/14/13   Heath Lark, MD  vitamin B-12 (CYANOCOBALAMIN) 1000 MCG tablet Take 1 tablet (1,000 mcg total) by mouth daily. Patient not taking: Reported on 11/13/2014 09/14/13   Heath Lark, MD    Family History Family History  Problem Relation Age of Onset  . Hypertension Other   . Heart attack Other   . Leukemia Paternal Uncle        leukemia    Social History Social History   Tobacco Use  . Smoking status: Former Smoker    Packs/day: 0.50    Years: 23.00    Pack years: 11.50    Types: Cigarettes    Last attempt to quit: 09/15/2014    Years since quitting: 4.0  . Smokeless tobacco: Never Used  Substance Use Topics  . Alcohol use: No  . Drug use: No     Allergies   Patient has no known allergies.   Review of Systems Review of Systems  Constitutional: Positive for activity change.  Respiratory: Negative for shortness of breath.   Cardiovascular: Negative for chest pain.  Musculoskeletal: Positive for arthralgias.  Skin: Positive for rash.  Hematological: Does not bruise/bleed easily.     Physical Exam Updated Vital Signs BP (!) 109/42   Pulse 80   Temp 98.1 F (36.7 C) (Oral)   Resp 16   SpO2 97%   Physical Exam Vitals signs and nursing note reviewed.  Constitutional:      Appearance: She is well-developed.  HENT:     Head: Normocephalic and atraumatic.  Neck:     Musculoskeletal: Normal range of motion and neck supple.    Cardiovascular:     Rate and Rhythm: Normal rate.  Pulmonary:     Effort: Pulmonary effort is normal.  Abdominal:     General: Bowel sounds are normal.  Musculoskeletal:        General: Swelling and tenderness present.     Right lower leg: Edema present.     Left lower leg: Edema present.  Skin:    General: Skin is warm and dry.  Neurological:     Mental Status: She is alert and oriented to person, place, and time.            ED Treatments / Results  Labs (all labs ordered are listed, but only abnormal results are displayed) Labs  Reviewed  COMPREHENSIVE METABOLIC PANEL - Abnormal; Notable for the following components:      Result Value   Glucose, Bld 112 (*)    BUN 25 (*)    Total Protein 6.3 (*)    AST 14 (*)    All other components within normal limits  CBC WITH DIFFERENTIAL/PLATELET - Abnormal; Notable for the following components:   WBC 11.3 (*)    RBC 5.83 (*)    Hemoglobin 16.3 (*)    HCT 53.2 (*)    RDW 17.1 (*)    Platelets 52 (*)    Neutro Abs 8.8 (*)    Abs Immature Granulocytes 0.09 (*)    All other components within normal limits  PROTIME-INR  URINALYSIS, ROUTINE W REFLEX MICROSCOPIC  I-STAT CG4 LACTIC ACID, ED  I-STAT CG4 LACTIC ACID, ED    EKG None  Radiology Dg Chest 2 View  Result Date: 09/29/2018 CLINICAL DATA:  Hypoxia.  Sepsis. EXAM: CHEST - 2 VIEW COMPARISON:  Chest x-ray dated June 09, 2017. CT abdomen pelvis dated November 13, 2014. FINDINGS: The heart size and mediastinal contours are within normal limits. Normal pulmonary vascularity. Low lung volumes with unchanged atelectasis/scarring in the lingula and right lower lobe. No focal consolidation, pleural effusion, or pneumothorax. No acute osseous abnormality. Multiple lower thoracic chronic compression deformities are unchanged. IMPRESSION: 1. No active cardiopulmonary disease. Stable scarring in the lingula and right lower lobe. Electronically Signed   By: Titus Dubin M.D.    On: 09/29/2018 13:40    Procedures Procedures (including critical care time)  Medications Ordered in ED Medications  sodium chloride flush (NS) 0.9 % injection 3 mL (3 mLs Intravenous Given 09/29/18 1430)  doxycycline (VIBRA-TABS) tablet 100 mg (100 mg Oral Given 09/29/18 1524)     Initial Impression / Assessment and Plan / ED Course  I have reviewed the triage vital signs and the nursing notes.  Pertinent labs & imaging results that were available during my care of the patient were reviewed by me and considered in my medical decision making (see chart for details).     Patient comes in with chief complaint of leg swelling. Patient has history of CHF, CKD and known history of peripheral edema. It appears that she has venous stasis, and she has been taking care of herself.  Over the past few days because of her back pain she has been having difficulty taking care of her wound, and now she has foul-smelling discharge from both sides.  Clinically it appears to me that she likely has venous stasis dermatitis with foul smell coming from necrotic skin.  Unsure if there is true cellulitis, but patient states that her symptoms are worse than they were 2 years ago when she was admitted for cellulitis.  We will give her oral doxycycline.  We will also change her dressing.  Additionally, patient was noted to have hypoxia at time of arrival.  She is on nasal cannula and breathing comfortably.  She denies any chest pain, shortness of breath over the past few days.  There is no history of PE, DVT and risk factors for the same.  If she has persistent hypoxia then she might need PE work-up.  Final Clinical Impressions(s) / ED Diagnoses   Final diagnoses:  Bilateral lower extremity edema  Cellulitis of lower extremity, unspecified laterality  Venous stasis dermatitis of both lower extremities    ED Discharge Orders    None       Varney Biles, MD  09/29/18 1610  

## 2018-09-29 NOTE — H&P (Addendum)
Date: 09/29/2018               Patient Name:  [name] MRN: [id number]  DOB: [birth year] Age / Sex: 68 y.o., female   PCP: [name], NP         Medical Service: Internal Medicine Teaching Service         Attending Physician: Dr. Lucious Groves, DO    First Contact: Dr. Nita Sickle MD Pager: [phone]  Second Contact: Dr. Amado Coe MD Pager: [phone]       After Hours (After 5p/  First Contact Pager: [phone]  weekends / holidays): Second Contact Pager: [phone]   Chief Complaint: Lower extremity swelling  History of Present Illness: [name] is a 68 year old female with chronic diastolic congestive heart failure, chronic kidney disease, hypertension, ITP, and chronic back pain who presents with lower extremity swelling.    She states that over the last several months she has had increasing lower extremity edema.  She has also gained a "significant amount of weight" but does not regularly check her weight at home.  She does believe that her abdomen has felt tighter as well.  She is followed by Dr. Doylene Canard (cardiologist) and was told that she should increase her daily dose of Lasix (normally 80 mg daily) several months ago.  She did not do this because she kept forgetting.  Her lower extremity edema continued to worsen and she started to have severe oozing from both legs.  She attempted to wrap her legs up but this did not really help.  She follow-up with Dr. Doylene Canard today and he sent her to the emergency department for evaluation of her lower extremity edema and possible infection.  She states that over the last several weeks she has also felt more short of breath walking around but is unsure whether this is due to her breathing or chronic back pain secondary to compression fracture s/p repair in 2015.  She has also had to prop her self up in bed to sleep comfortably or sleep on a recliner.  She denies fevers but endorses intermittent chills over the last several  weeks.  She is also intermittently constipated and her last bowel movement was 3 days ago after taking a laxative.  She denies chest pain, palpitations, and abdominal pain.   Of note, she reports having cellulitis of her LEs in the past (we do not have record of this) and states that she was given antibiotic treatment with resolution of her symptoms.    Meds:  Current Meds  Medication Sig  . amLODipine (NORVASC) 5 MG tablet Take 1 tablet (5 mg total) by mouth daily.  . carvedilol (COREG) 6.25 MG tablet Take 1 tablet (6.25 mg total) by mouth 2 (two) times daily with a meal.  . diphenhydramine-acetaminophen (TYLENOL PM) 25-500 MG TABS tablet Take 1 tablet by mouth at bedtime as needed (sleep).  . furosemide (LASIX) 80 MG tablet Take 1 tablet (80 mg total) by mouth daily.  Marland Kitchen lisinopril (PRINIVIL,ZESTRIL) 2.5 MG tablet Take 1 tablet (2.5 mg total) by mouth daily.  . methocarbamol (ROBAXIN) 500 MG tablet Take 500 mg by mouth every 6 (six) hours as needed for muscle spasms.  Marland Kitchen oxyCODONE-acetaminophen (PERCOCET) 10-325 MG tablet Take 1 tablet by mouth 3 (three) times daily as needed for pain.  . polyethylene glycol (MIRALAX / GLYCOLAX) packet Take 17 g by mouth daily. (Patient taking differently: Take 17 g by mouth daily as needed. )  .  potassium chloride (K-DUR) 10 MEQ tablet Take 1 tablet (10 mEq total) by mouth daily.     Allergies: Allergies as of 09/29/2018  . (No Known Allergies)   Past Medical History:  Diagnosis Date  . Arthritis    lumbar burst fx, osteoporosis  . Back pain 09/29/2013  . Blood dyscrasia   . Chest pain    denies  . Chronic kidney disease    passed stones spontaneously- 40 yrs. ago   . Depression    states her current situation with activity limitations, she has a low spirit at times.   Marland Kitchen Dysrhythmia    occ tachycardia  . Fracture of vertebra, compression ([place])   . GERD (gastroesophageal reflux disease)    occ  . History of kidney stones   . HTN  (hypertension)   . ITP (idiopathic thrombocytopenic purpura) 08/29/2013  . Leukocytosis, unspecified 09/05/2013  . Muscle cramp 11/16/2013  . Tachycardia    occ upon exersion, and when put on bp med    Family History:  Family History  Problem Relation Age of Onset  . Hypertension Other   . Heart attack Other   . Leukemia Paternal Uncle        leukemia    Social History: She is a retired lunch lady.  She is a formal tobacco user.  She quit 4 years ago.  She denies alcohol and drug use.  Review of Systems: A complete ROS was negative except as per HPI.   Physical Exam: Blood pressure (!) 109/42, pulse 80, temperature 98.1 F (36.7 C), temperature source Oral, resp. rate 16, SpO2 97 %. Physical Exam Vitals signs and nursing note reviewed.  Constitutional:      Comments: Obese female lying in bed.  She appears very uncomfortable.  When attempting to lower her bed so that she can rearrange herself, she becomes more short of breath and has a significant amount of back pain.  HENT:     Head: Normocephalic and atraumatic.  Cardiovascular:     Comments: Normal heart rate.  Difficult to appreciate heart sounds given large body habitus. Pulmonary:     Comments: Initially on 5 L supplemental oxygen.  Attempted to wean her to room air with significant drop in her oxygen saturations to low to mid 80s.  Restarted supplemental oxygen at 2 L with oxygen saturations above 90%.  I auscultated her lungs anteriorly.  They did appear clear to auscultation bilaterally. Musculoskeletal:     Comments: 2+ pitting edema bilaterally up to the knee.  Erythema of both lower extremities present.  Thickened skin consistent with venous stasis present.  Multiple open wounds of both lower extremities (please see photos).  Skin:    General: Skin is warm.  Neurological:     Mental Status: She is oriented to person, place, and time.  Psychiatric:        Mood and Affect: Mood normal.        Behavior: Behavior  normal.        Labs: 3 times UA unremarkable Lactic acid: 0.85 BMP: Unremarkable CBC: Mild leukocytosis 11.3, low platelets 52  CXR: personally reviewed my interpretation is low lung volumes, no interstitial infiltrates, no pleural effusion.  Assessment & Plan by Problem: Principal Problem:   Acute diastolic congestive heart failure (HCC) Active Problems:   HTN (hypertension)   Idiopathic thrombocytopenic purpura (HCC)   Chronic back pain  Ms. Arterburn has HFpEF, CKD, HTN, and chronic back pain and presented from her cardiologist office with  severe LE edema, necrotic skin, and erythema concerning for cellulitis vs venous stasis dermatitis. Additionally she was found to have a new oxygen requirement, worsening orthopnea, and dyspnea on exertion. These symptoms along with her severe LE edema make me concerned for an acute on chronic congestive heart failure exacerbation. Her CXR does not show signs of active infection.   LE Cellulitis vs venous stasis dermatitis: Given doxycycline in the ED. - Start cefazolin 2 g every 8 hours - Continue home Acetaminophen 650 mg every 6 hours PRN pain  - Continue home Percocet 5-3 25 2  tablets every 8 hours PRN pain (taking for chronic back pain) - Consult wound care  Acute on chronic diastolic congestive heart failure exacerbation: 2018 echo with EF of 60 to 65% with grade 1 diastolic dysfunction and mild aortic regurg.  Home medications include amlodipine, carvedilol, furosemide, lisinopril.  We do not have a recent weight on her since 2018.  She does appear to be volume up on exam. - Follow-up BNP - Continue home amlodipine 5 mg daily, carvedilol 6.25 mg twice daily, lisinopril 2.5 mg daily - We will give Lasix 60 mg IV twice daily - Strict I's and O's - Daily weights  Hypertension: Blood pressure within normal limits on home medications - Continue amlodipine, carvedilol, lisinopril, and Lasix per above  ITP: Platelets stable at 52,000  (around baseline). -Continue to monitor  Chronic Back Pain 2/2 compression fracture s/p repair in 2015: - Acetaminophen and Percocet per above - Methocarbamol 500 mg every 6 hours PRN muscle spasms  FEN/GI: heart healthy, no IVFs DVT PPX: Do not want to give her anti-coagulation due to low platelets at 52,000. Cannot place SCDs because of LE cellulitis. CODE STATUS: FULL CODE  Dispo: Admit patient to Inpatient with expected length of stay greater than 2 midnights.  Signed: Carroll Sage, MD 09/29/2018, 3:52 PM  Pager: [phone]

## 2018-09-29 NOTE — Progress Notes (Signed)
Pharmacy Antibiotic Note  [name] is a 68 y.o. female admitted on 09/29/2018 with leg swelling and Pharmacy has been consulted for Ancef dosing.  SCr 0.96, CrCL 57 ml/min, afebrile, WBC 11.3, LA 0.85.  Plan: [place] will sign off as dosage adjustment is likely unnecessary.  Thank you for the consult!   Height: 4\' 10"  (147.3 cm) Weight: 204 lb 9.4 oz (92.8 kg) IBW/kg (Calculated) : 40.9  Temp (24hrs), Avg:98.1 F (36.7 C), Min:98.1 F (36.7 C), Max:98.1 F (36.7 C)  Recent Labs  Lab 09/29/18 1302 09/29/18 1324  WBC 11.3*  --   CREATININE 0.96  --   LATICACIDVEN  --  0.85    Estimated Creatinine Clearance: 56.9 mL/min (by C-G formula based on SCr of 0.96 mg/dL).    No Known Allergies   Francena Zender D. Mina Marble, PharmD, BCPS, [place] 09/29/2018, 4:44 PM

## 2018-09-29 NOTE — ED Triage Notes (Signed)
Pt arrives from cards with a c/o of bilateral leg swelling with wounds. Pt was 80% on room air pt placed on 2L [place].

## 2018-09-29 NOTE — ED Notes (Signed)
MD bedside

## 2018-09-30 ENCOUNTER — Encounter (HOSPITAL_COMMUNITY): Payer: Self-pay | Admitting: *Deleted

## 2018-09-30 ENCOUNTER — Inpatient Hospital Stay (HOSPITAL_COMMUNITY): Payer: Medicare Other

## 2018-09-30 DIAGNOSIS — I5033 Acute on chronic diastolic (congestive) heart failure: Secondary | ICD-10-CM

## 2018-09-30 DIAGNOSIS — N189 Chronic kidney disease, unspecified: Secondary | ICD-10-CM

## 2018-09-30 DIAGNOSIS — G8929 Other chronic pain: Secondary | ICD-10-CM

## 2018-09-30 DIAGNOSIS — Z79891 Long term (current) use of opiate analgesic: Secondary | ICD-10-CM

## 2018-09-30 DIAGNOSIS — I872 Venous insufficiency (chronic) (peripheral): Secondary | ICD-10-CM | POA: Diagnosis present

## 2018-09-30 DIAGNOSIS — M4850XS Collapsed vertebra, not elsewhere classified, site unspecified, sequela of fracture: Secondary | ICD-10-CM

## 2018-09-30 DIAGNOSIS — Z79899 Other long term (current) drug therapy: Secondary | ICD-10-CM

## 2018-09-30 DIAGNOSIS — I96 Gangrene, not elsewhere classified: Secondary | ICD-10-CM

## 2018-09-30 DIAGNOSIS — I130 Hypertensive heart and chronic kidney disease with heart failure and stage 1 through stage 4 chronic kidney disease, or unspecified chronic kidney disease: Secondary | ICD-10-CM

## 2018-09-30 DIAGNOSIS — M62830 Muscle spasm of back: Secondary | ICD-10-CM

## 2018-09-30 DIAGNOSIS — D693 Immune thrombocytopenic purpura: Secondary | ICD-10-CM

## 2018-09-30 DIAGNOSIS — S81809A Unspecified open wound, unspecified lower leg, initial encounter: Secondary | ICD-10-CM | POA: Diagnosis present

## 2018-09-30 LAB — BASIC METABOLIC PANEL
Anion gap: 11 (ref 5–15)
BUN: 19 mg/dL (ref 8–23)
CO2: 28 mmol/L (ref 22–32)
Calcium: 8.8 mg/dL — ABNORMAL LOW (ref 8.9–10.3)
Chloride: 100 mmol/L (ref 98–111)
Creatinine, Ser: 0.8 mg/dL (ref 0.44–1.00)
GFR calc Af Amer: >60 mL/min (ref >60)
GFR calc non Af Amer: >60 mL/min (ref >60)
Glucose, Bld: 115 mg/dL — ABNORMAL HIGH (ref 70–99)
Potassium: 4.3 mmol/L (ref 3.5–5.1)
Sodium: 139 mmol/L (ref 135–145)

## 2018-09-30 LAB — CBC
HCT: 50.9 % — ABNORMAL HIGH (ref 36.0–46.0)
Hemoglobin: 16 g/dL — ABNORMAL HIGH (ref 12.0–15.0)
MCH: 28.4 pg (ref 26.0–34.0)
MCHC: 31.4 g/dL (ref 30.0–36.0)
MCV: 90.4 fL (ref 80.0–100.0)
Platelets: 53 10*3/uL — ABNORMAL LOW (ref 150–400)
RBC: 5.63 MIL/uL — ABNORMAL HIGH (ref 3.87–5.11)
RDW: 16.6 % — ABNORMAL HIGH (ref 11.5–15.5)
WBC: 11.8 10*3/uL — ABNORMAL HIGH (ref 4.0–10.5)
nRBC: 0 % (ref 0.0–0.2)

## 2018-09-30 LAB — TROPONIN I
Troponin I: <0.03 ng/mL (ref <0.03)
Troponin I: <0.03 ng/mL (ref <0.03)

## 2018-09-30 LAB — HIV ANTIBODY (ROUTINE TESTING W REFLEX): HIV Screen 4th Generation wRfx: NONREACTIVE

## 2018-09-30 MED ORDER — PNEUMOCOCCAL VAC POLYVALENT 25 MCG/0.5ML IJ INJ
0.5000 mL | INJECTION | INTRAMUSCULAR | Status: DC
  Filled 2018-09-30: qty 0.5

## 2018-09-30 MED ORDER — INFLUENZA VAC SPLIT HIGH-DOSE 0.5 ML IM SUSY
0.5000 mL | PREFILLED_SYRINGE | INTRAMUSCULAR | Status: DC
  Filled 2018-09-30: qty 0.5

## 2018-09-30 MED ORDER — PROMETHAZINE HCL 25 MG PO TABS
12.5000 mg | ORAL_TABLET | Freq: Four times a day (QID) | ORAL | Status: DC | PRN
  Administered 2018-09-30 – 2018-10-02 (×4): 12.5 mg via ORAL
  Filled 2018-09-30 (×4): qty 1

## 2018-09-30 MED ORDER — RAMELTEON 8 MG PO TABS
8.0000 mg | ORAL_TABLET | Freq: Every day | ORAL | Status: DC
  Administered 2018-09-30 – 2018-10-06 (×8): 8 mg via ORAL
  Filled 2018-09-30 (×8): qty 1

## 2018-09-30 MED ORDER — FUROSEMIDE 10 MG/ML IJ SOLN
80.0000 mg | Freq: Two times a day (BID) | INTRAMUSCULAR | Status: DC
  Administered 2018-09-30 – 2018-10-02 (×6): 80 mg via INTRAVENOUS
  Filled 2018-09-30 (×7): qty 8

## 2018-09-30 MED ORDER — METHOCARBAMOL 500 MG PO TABS
1000.0000 mg | ORAL_TABLET | Freq: Four times a day (QID) | ORAL | Status: DC | PRN
  Administered 2018-09-30 – 2018-10-03 (×10): 1000 mg via ORAL
  Filled 2018-09-30 (×10): qty 2

## 2018-09-30 NOTE — Evaluation (Signed)
Occupational Therapy Evaluation Patient Details Name: [name] MRN: [id number] DOB: Aug 11, 1951 Today's Date: 09/30/2018    History of Present Illness Pt is a 68 year old woman admitted 09/29/18 with CHF exacerbation. PMH: CKD, HTN, depression, compression fx, chronic back pain, obesity.   Clinical Impression   Pt lives with her daughter who works and ambulates with a cane. She has been sponge bathing lately. Pt struggles with reaching her feet for LB ADL. Pt presents with decreased activity tolerance, requiring 3L 02 to maintain sats >90% during activity. She needs assist for donning socks and pericare. Will follow acutely.    Follow Up Recommendations  Home health OT    Equipment Recommendations  Tub/shower bench    Recommendations for Other Services       Precautions / Restrictions Precautions Precautions: Fall Precaution Comments: pt denies any falls at home      Mobility Bed Mobility               General bed mobility comments: pt received in chair  Transfers Overall transfer level: Needs assistance Equipment used: Rolling walker (2 wheeled) Transfers: Sit to/from Stand Sit to Stand: Supervision              Balance Overall balance assessment: Mild deficits observed, not formally tested                                         ADL either performed or assessed with clinical judgement   ADL Overall ADL's : Needs assistance/impaired Eating/Feeding: Independent;Sitting   Grooming: Wash/dry hands;Standing;Minimal assistance Grooming Details (indicate cue type and reason): to reach soap Upper Body Bathing: Set up;Sitting   Lower Body Bathing: Minimal assistance;Sit to/from stand   Upper Body Dressing : Set up;Sitting   Lower Body Dressing: Moderate assistance;Sit to/from stand   Toilet Transfer: Supervision/safety;Ambulation;RW;Regular Toilet   Toileting- Clothing Manipulation and Hygiene: Maximal assistance;Sit to/from  Nurse, children's Details (indicate cue type and reason): educated pt in availability of tub transfer bench Functional mobility during ADLs: Supervision/safety;Rolling walker       Vision Baseline Vision/History: Wears glasses Wears Glasses: At all times Patient Visual Report: No change from baseline       Perception     Praxis      Pertinent Vitals/Pain Pain Assessment: 0-10 Pain Score: 9  Pain Location: back Pain Descriptors / Indicators: Aching Pain Intervention(s): Monitored during session     Hand Dominance Right   Extremity/Trunk Assessment Upper Extremity Assessment Upper Extremity Assessment: Overall WFL for tasks assessed   Lower Extremity Assessment Lower Extremity Assessment: Defer to PT evaluation   Cervical / Trunk Assessment Cervical / Trunk Assessment: Other exceptions(chronic back pain)   Communication Communication Communication: No difficulties   Cognition Arousal/Alertness: Awake/alert Behavior During Therapy: Flat affect Overall Cognitive Status: Within Functional Limits for tasks assessed                                     General Comments       Exercises     Shoulder Instructions      Home Living Family/patient expects to be discharged to:: Private residence Living Arrangements: Children Available Help at Discharge: Family;Available PRN/intermittently Type of Home: House Home Access: Stairs to enter Entrance Stairs-Number of Steps: 3 Entrance Stairs-Rails: Right  Home Layout: One level     Bathroom Shower/Tub: Teacher, early years/pre: Standard     Home Equipment: Cane - single point;Bedside commode          Prior Functioning/Environment Level of Independence: Independent with assistive device(s)        Comments: drives, has been sponge bathing, can make simple meals, 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, pt cannot cut her toenails or don socks--wear slip on shoes         OT Problem List: Decreased activity tolerance      OT Treatment/Interventions: Self-care/ADL training;DME and/or AE instruction;Patient/family education;Balance training;Therapeutic activities;Energy conservation    OT Goals(Current goals can be found in the care plan section) Acute Rehab OT Goals Patient Stated Goal: to return home independently OT Goal Formulation: With patient Time For Goal Achievement: 10/14/18 Potential to Achieve Goals: Good ADL Goals Pt Will Perform Grooming: with modified independence;standing Pt Will Perform Lower Body Bathing: with modified independence;with adaptive equipment;sit to/from stand Pt Will Perform Lower Body Dressing: with modified independence;with adaptive equipment;sit to/from stand Pt Will Transfer to Toilet: with modified independence;ambulating;regular height toilet Pt Will Perform Toileting - Clothing Manipulation and hygiene: with modified independence;with adaptive equipment;sit to/from stand Pt Will Perform Tub/Shower Transfer: Tub transfer;with modified independence;ambulating;tub bench Additional ADL Goal #1: Pt will utilize energy conservation strategies independently.  OT Frequency: Min 2X/week   Barriers to D/C:            Co-evaluation              AM-PAC OT "6 Clicks" Daily Activity     Outcome Measure Help from another person eating meals?: None Help from another person taking care of personal grooming?: A Little Help from another person toileting, which includes using toliet, bedpan, or urinal?: A Lot Help from another person bathing (including washing, rinsing, drying)?: A Lot Help from another person to put on and taking off regular upper body clothing?: None Help from another person to put on and taking off regular lower body clothing?: A Lot 6 Click Score: 17   End of Session Equipment Utilized During Treatment: Rolling walker;Oxygen  Activity Tolerance: Patient tolerated treatment well Patient left: in  chair;with call bell/phone within reach  OT Visit Diagnosis: Pain(decreased activity tolerance)                Time: 1245-8099 OT Time Calculation (min): 15 min Charges:  OT General Charges $OT Visit: 1 Visit OT Evaluation $OT Eval Moderate Complexity: 1 Mod  Malka So 09/30/2018, 9:05 AM  Nestor Lewandowsky, OTR/L Acute Rehabilitation Services Pager: [phone] Office: [phone]

## 2018-09-30 NOTE — Discharge Summary (Addendum)
Name: [name] MRN: [id number] DOB: 09-21-1950 68 y.o. PCP: [name], NP  Date of Admission: 09/29/2018  1:02 PM Date of Discharge: 10/07/2018 Attending Physician: Lucious Groves, DO  Discharge Diagnosis: 1. Venous Stasis Dermatitis complicated by RLE Cellulitis 2. Acute on Chronic Hypoxic respiratory failure secondary to hypoventilation syndrome and acute on chronic heart failure 3. Chronic Back Pain 4. Opioid-induced Constipation 5. Idiopathic Thrombocytopenic Purpura 6. Secondary Polycythemia Vera 7. Hypertension  Discharge Medications: Allergies as of 10/07/2018   No Known Allergies     Medication List    STOP taking these medications   doxycycline 100 MG tablet Commonly known as:  VIBRA-TABS   folic acid 1 MG tablet Commonly known as:  FOLVITE   vitamin B-12 1000 MCG tablet Commonly known as:  CYANOCOBALAMIN     TAKE these medications   amLODipine 5 MG tablet Commonly known as:  NORVASC Take 1 tablet (5 mg total) by mouth daily.   carvedilol 6.25 MG tablet Commonly known as:  COREG Take 1 tablet (6.25 mg total) by mouth 2 (two) times daily with a meal.   diphenhydramine-acetaminophen 25-500 MG Tabs tablet Commonly known as:  TYLENOL PM Take 1 tablet by mouth at bedtime as needed (sleep).   furosemide 80 MG tablet Commonly known as:  LASIX Take 1 tablet (80 mg total) by mouth 2 (two) times daily for 30 days. What changed:  when to take this   lisinopril 2.5 MG tablet Commonly known as:  PRINIVIL,ZESTRIL Take 1 tablet (2.5 mg total) by mouth daily.   methocarbamol 500 MG tablet Commonly known as:  ROBAXIN Take 2 tablets (1,000 mg total) by mouth every 6 (six) hours as needed for up to 15 days for muscle spasms. What changed:  how much to take   oxyCODONE-acetaminophen 10-325 MG tablet Commonly known as:  PERCOCET Take 1 tablet by mouth 3 (three) times daily as needed for pain.   polyethylene glycol packet Commonly known as:   MIRALAX / GLYCOLAX Take 17 g by mouth daily. What changed:    when to take this  reasons to take this   potassium chloride 10 MEQ tablet Commonly known as:  K-DUR Take 1 tablet (10 mEq total) by mouth daily.            Durable Medical Equipment  (From admission, onward)         Start     Ordered   10/07/18 0000  For home use only DME oxygen    Comments:  Supplemental oxygen with ambulation only.  Question Answer Comment  Mode or (Route) Nasal cannula   Liters per Minute 2   Frequency Continuous (stationary and portable oxygen unit needed)   Oxygen conserving device No   Oxygen delivery system Gas      10/07/18 0907          Disposition and follow-up:   [name] was discharged from [facility] in Stable condition.  At the hospital follow up visit please address:  1.  CHF with RV Failure secondary to OHS: recommend scheduling outpatient sleep study and PFTs. Monitor LE edema and signs of worsening fluid overload. Monitor her Cr levels.  Venous Stasis: monitor for healing of LE wounds and decreased edema of legs and resolution of signs of infection. She will need Unna boots changed once a week until wounds heal and swelling decreases enough to be able to place compression stockings.  Opioid induced constipation: please monitor for bowel  movement and check for continued nausea.  2.  Labs / imaging needed at time of follow-up: BMP  3.  Pending labs/ test needing follow-up: none  Follow-up Appointments:  Follow-up Information    [place].   Why:  We have scheduled you an appointment on 1/29 at 2:15pm.  Contact information: [address] [place] Hospital Course by problem list: 1. Venous Stasis Dermatitis complicated by RLE Cellulitis: She presented with clinical signs of chronic venous stasis with new onset cellulitis including circumferential hemosiderin  changes on bilateral lower legs with chronic wounds with purulent drainage, odor, and increased erythema from RLE. We started her on cefazolin 2 g IV for suspected cellulitis and transferred to Keflex for a total of 7 days.  Upon discharge leg pain had decreased, erythema, and purulent drainage had resolved. We maximized her fluid status by increasing IV Lasix to 80 mg BID and eventually transitioning to 80 mg BID oral on discharge. BLE edema was reduced but still present by discharge. There was concern for underlying PAD which could be effecting wound healing but U/S ABI was not able to be completed due to her inability to lie flat 2/2 to chronic low back pain. Unna boots were placed to help with compression of extremities. LE edema most likely sequelae of worsening right heart failure secondary to pulmonary HTN from obesity hypoventilation syndrome so she will need further workup outpatient.  2. Acute on Chronic Hypoxic respiratory failure secondary to hypoventilation syndrome and acute on chronic heart failure: On presentation, she showed signs of acute worsening of heart failure with increased pitting edema in BLE, new onset oxygen requirement,  increased weight gain, and increased SOB on exertion. CXR showed decreased lung volumes but no pleural effusions. On chart review she was found to have a previous echo showing LVEF of 60-65% and grade 1 diastolic dysfunction. Echo during this admission showed LVEF of 60-65% with worsening right ventricular function (mildly-moderately reduced function). Worsening right heart failure most likely due to untreated obesity hypoventilation syndrome leading to pulmonary HTN and resulting right ventricle enlargement and right heart failure. In hospital we attempted to trial BiPaP but was unable to due to her unit not being equipped to start bipap. We recommend an outpatient sleep study and PFTs to evaluate lung function.  Fluid status was maximized with IV and oral lasix as  described above. On discharge, she continued to have SOB on exertion, BLE edema but was saturating well on room air at rest. She was discharged with home O2 for ambulation only.  Home medications including amlodipine 5 mg, carvedilol 6.25 mg BID, and lisinopril 2.5 mg daily which were all continued during this admission. Oral lasix was increased to oral 80 mg BID.  3. Chronic Back Pain: On presentation, she had increased back pain due to increased muscle spasm. She was continued her home dose of Percocet 325mg  TID. We increased her home methocarbamol to 1000 mg q6h. She was discharged on these medications.  4. Opioid-induced Constipation: Treated with Miralax, senokot, and Bisacodyl suppositories.  5. Idiopathic Thrombocytopenic Purpura: Remained stable throughout admission. Discharge platelets 56,000.  6. Secondary Polycythemia: Likely due to underlying obesity hypoventilation syndrome. EPO level 4.6 which is WNLs but Hb did improve with supplemental oxygen (Hb 17.4->14.9) suggesting secondary polycythemia due to acute on chronic hypoxic respiratory failure from OHS. As her Hb levels are now not significantly elevated will defer further polycythemia  work-up. Please consider repeat EPO if her Hb rises.   7. Hypertension: Remained well-controlled on lisinopril and carvedilol.  Discharge Vitals:   BP (!) 117/59 (BP Location: Left Arm)   Pulse 83   Temp 98.8 F (37.1 C) (Oral)   Resp 20   Ht 4\' 10"  (1.473 m)   Wt 99.3 kg Comment: scale a  SpO2 92%   BMI 45.75 kg/m   Pertinent Labs, Studies, and Procedures:  CXR: No active cardiopulmonary disease. Stable scarring in the lingula and right lower lobe. BNP: 34.7 on admission Troponins: <0.03  CBC: Wbc 11.8>12.3 on admission, platelets 53>56 (baseline), Hgb 16.3>14.9, Hct 53.2>46.6 EPO Level: 4.6  (nl 2.6-18.5)   Discharge Instructions:  Discharge Instructions    (HEART FAILURE PATIENTS) Call MD:  Anytime you have any of the following  symptoms: 1) 3 pound weight gain in 24 hours or 5 pounds in 1 week 2) shortness of breath, with or without a dry hacking cough 3) swelling in the hands, feet or stomach 4) if you have to sleep on extra pillows at night in order to breathe.   Complete by:  As directed    Call MD for:  persistant nausea and vomiting   Complete by:  As directed    Call MD for:  redness, tenderness, or signs of infection (pain, swelling, redness, odor or green/yellow discharge around incision site)   Complete by:  As directed    Call MD for:  temperature >100.4   Complete by:  As directed    Diet - low sodium heart healthy   Complete by:  As directed    Discharge instructions   Complete by:  As directed    [name],   You were admitted to the hospital due to an infection in one of your legs. We treated this with antibiotics and unna boots. You completed your antibiotic course in the hospital and wont need to take antibiotics at home. The unna boots can be changed on a weekly basis.  For your heart failure, continue taking your medications as usual. It is super important that you weigh yourself EVERY DAY. If you increase 3 pounds in a week OR 5 pounds in 1 day call either your cardiologist or your regular doctor to let them know. They may want to change your Lasix dose.   Please follow [facility] at [facility] on 10/13/18 at 2:15 PM. We can help you set up the studies you need. You will need a sleep study to see if you need a CPAP as well as lung function tests. This is very very important.   Please call us if you have any questions or concerns.   - Dr. Frederico Hamman   Face-to-face encounter (required for Medicare/Medicaid patients)   Complete by:  As directed    I Carroll Sage certify that this patient is under my care and that I, or a nurse practitioner or physician's assistant working with me, had a face-to-face encounter that meets the physician face-to-face encounter requirements with this patient on  10/07/2018. The encounter with the patient was in whole, or in part for the following medical condition(s) which is the primary reason for home health care (List medical condition): Heart failure with preserved ejection fraction, chronic back pain, morbid obesity, obesity hypoventilation syndrome.   The encounter with the patient was in whole, or in part, for the following medical condition, which is the primary reason for home health care:  Heart failure with preserved  ejection fraction, chronic back pain, morbid obesity, obesity hypoventilation syndrome.   I certify that, based on my findings, the following services are medically necessary home health services:  Nursing   Reason for Medically Necessary Home Health Services:  Skilled Nursing- Change/Decline in Patient Status   My clinical findings support the need for the above services:  Shortness of breath with activity   Further, I certify that my clinical findings support that this patient is homebound due to:  Shortness of Breath with activity   For home use only DME oxygen   Complete by:  As directed    Supplemental oxygen with ambulation only.   Mode or (Route):  Nasal cannula   Liters per Minute:  2   Frequency:  Continuous (stationary and portable oxygen unit needed)   Oxygen conserving device:  No   Oxygen delivery system:  Gas   Home Health   Complete by:  As directed    She will need to have her Unna boots replaced once a week until her wounds are completely healed or otherwise specified by new PCP.   To provide the following care/treatments:   Home Health Aide RN     Increase activity slowly   Complete by:  As directed       Signed: Nita Sickle MD

## 2018-09-30 NOTE — Consult Note (Addendum)
[place] Nurse wound consult note Reason for Consult: Consult requested for bilat legs.  Pt has chronic venous stasis skin changes to legs.  She states she developed increased edema and leaking this week. Pt states she has used compression stockings in the past, but can no longer put them on related to her decreased mobility and increased swelling. Wound type: BLE with generalized edema and erythemia.  Multiple areas of dry crusted skin which lifts off easily in some locations, other sites are tightly adhered yellow moist full thickness wounds.  Left inner calf: 5X8X.2cm Left outer leg 10X5X.2cm Right outer leg 9X10X.2cm Drainage (amount, consistency, odor) mod amt yellow drainage, some odor. Dressing procedure/placement/frequency: Xeroform gauze to promote drying and healing.  Abd pads to absorb drainage, kerlex and ace wrap to provide light compression.  Discussed plan of care with patient and she verbalized understanding.  Pt could benefit from home health assistance after discharge for dressing change assistance, please order if desired. Please re-consult if further assistance is needed.  Gae Dry MSN, RN, [place], [place], [place].

## 2018-09-30 NOTE — Plan of Care (Signed)
  Problem: Nutrition: Goal: Adequate nutrition will be maintained Outcome: Progressing   Problem: Elimination: Goal: Will not experience complications related to bowel motility Outcome: Progressing   Problem: Safety: Goal: Ability to remain free from injury will improve Outcome: Progressing   

## 2018-09-30 NOTE — Progress Notes (Signed)
Pt. Requesting med to help her sleep. On call for IMTS paged to make aware.

## 2018-09-30 NOTE — Progress Notes (Signed)
Subjective:  [name] is doing okay this morning. She states that she did not sleep well last night because she can not get comfortable in the recliner in her room. She states that her trouble with lying down is due to her back pain not due to shortness of breath. She was given ramelteon but she stated that it only helped her sleep for an hours and a half. She notes increased urination but understands it is from the lasix. She does not endorse any other issues today besides her back pain and muscle spasms.   Objective: Vital signs in last 24 hours: Vitals:   09/29/18 1715 09/29/18 1804 09/30/18 0007 09/30/18 0645  BP: (!) 117/46 (!) 126/56 (!) 119/39 (!) 117/59  Pulse: 89 88 87 83  Resp:  16 20 20   Temp:  98.1 F (36.7 C) 99.4 F (37.4 C) 98.8 F (37.1 C)  TempSrc:  Oral Oral Oral  SpO2: 95% 92% 96% 92%  Weight:  98.7 kg  99.3 kg  Height:  4\' 10"  (1.473 m)     Weight change:   Intake/Output Summary (Last 24 hours) at 09/30/2018 3428 Last data filed at 09/30/2018 7681 Gross per 24 hour  Intake 460 ml  Output 1000 ml  Net -540 ml   General appearance: alert, cooperative and no distress Lungs: breath sounds diminished bilaterally without crackles appreciated but exam difficult d/t body habitus Heart: regular rate and rhythm, difficult to appreciate heart sounds due to body habitus Extremities: 2+ pitting edema bilaterally.  Pulses: Could not palpate distal pedal pulses bilaterally.  Skin: chronic wounds with overlying crusting that is removed easily. Right leg with increased purulent drainage from wounds. Hemosiderin changes circumferentially on bilateral legs stopping below the knee.  Medications: I have reviewed the patient's current medications. Scheduled Meds: . amLODipine  5 mg Oral Daily  . carvedilol  6.25 mg Oral BID WC  . lisinopril  2.5 mg Oral Daily  . ramelteon  8 mg Oral QHS   Continuous Infusions: .  ceFAZolin (ANCEF) IV 2 g (09/30/18 0147)    Assessment/Plan: Principal Problem:   Acute diastolic congestive heart failure (HCC) Active Problems:   HTN (hypertension)   Idiopathic thrombocytopenic purpura (HCC)   Chronic back pain   Venous stasis dermatitis   Wound of lower extremity, bilateral   Cellulitis of leg, right  [name] has HFpEF, CKD, HTN, and chronic back pain and presented from her cardiologist office with severe LE edema, necrotic skin, and erythema concerning for cellulitis vs venous stasis dermatitis. Additionally she was found to have a new oxygen requirement, worsening orthopnea, and dyspnea on exertion. These symptoms along with her severe LE edema suggests an acute on chronic congestive heart failure exacerbation.  LE Cellulitis vs venous stasis dermatitis: most likely etiology is venous stasis given that on exam the erythema encircles the entire lower leg bilaterally. It is possible that she could also have underlying PAD as well. Will order ultrasound ABI's to evaluate for PAD. Will maximize fluid status with lasix today to decrease swelling and skin breakdown. There are some signs of infection including purulent discharge, warmth to the right leg, and elevated white count making it difficult to differentiate between infection and venous stasis. We will continue the cefazolin in the mean time.  Wound care consult recommends home health for dressing changes on discharge. - continue cefazolin 2 g every 8 hours - continue Lasix 80 mg BID - Continue home Acetaminophen 650 mg every 6 hours PRN pain  -  Continue home Percocet 5-3 25 2  tablets every 8 hours PRN pain (taking for chronic back pain) -- Follow-up U/S ABI -- Plan for [facility] tomorrow after U/S performed  Acute on chronic diastolic congestive heart failure exacerbation: She remains hypervolemic on exam. Additionally she continued to require 2 L [place]. Will continue with Lasix treatment and add humidified air to decrease nasal dryness and congestion. BNP  normal at 34.7 but could be falsely low given obesity. -- Continue Lasix 80 mg BID -- Start humidified air for nasal cannula -- Continuous cardiac monitoring  - Continue home amlodipine 5 mg daily, carvedilol 6.25 mg twice daily, lisinopril 2.5 mg daily - Strict I's and O's - Daily weights  Hypertension: Blood pressure within normal limits on home medications. - Continue amlodipine, carvedilol, lisinopril, and Lasix per above  ITP: Platelets remain stable at 53,000 (at baseline).-Continue to monitor  Chronic Back Pain 2/2 compression fracture s/p repair in 2015: Continues to have muscle spasms.  - Acetaminophen and Percocet per above - Increase Methocarbamol 1000 mg every 6 hours PRN muscle spasms  FEN/GI: heart healthy, no IVFs DVT PPX: Do not want to give her anti-coagulation due to low platelets. Cannot place SCDs because of LE cellulitis. CODE STATUS: FULL CODE  Dispo: Anticipate discharge in 1-3 days.  This is a Careers information officer Note.  The care of the patient was discussed with Dr. Alfonse Spruce and the assessment and plan formulated with their assistance.  Please see their attached note for official documentation of the daily encounter.   LOS: 1 day   Synetta Shadow, Medical Student 09/30/2018, 7:28 AM  Attestation for Student Documentation:  I personally was present and performed or re-performed the history, physical exam and medical decision-making activities of this service and have verified that the service and findings are accurately documented in the student's note.  Carroll Sage, MD 09/30/2018, 2:01 PM

## 2018-09-30 NOTE — Evaluation (Signed)
Physical Therapy Evaluation Patient Details Name: [name] MRN: [id number] DOB: [birth year] Today's Date: 09/30/2018   History of Present Illness  Pt is a 68 y.o. woman admitted 09/29/18 with CHF exacerbation. PMH includes CKD, HTN, depression, compression fx, chronic back pain, obesity.    Clinical Impression  Pt presents with an overall decrease in functional mobility secondary to above. PTA, pt mod indep with SPC; lives with daughter who assists intermittently. Today, pt ambulatory with RW at supervision-level; reports feeling more stable with walker versus cane; recommend RW for home use. Required standing rest breaks due to fatigue and c/o back pain; SpO2 down to 84% on RA, returning to >90% on 3L O2 [place]. Pt would benefit from continued acute PT services to maximize functional mobility and independence prior to d/c home.     Follow Up Recommendations No PT follow up;Supervision - Intermittent    Equipment Recommendations  Other (comment)(youth-sized rolling walker)    Recommendations for Other Services       Precautions / Restrictions Precautions Precautions: Fall Precaution Comments: H/o compression fxs; chronic back pain Restrictions Weight Bearing Restrictions: No      Mobility  Bed Mobility               General bed mobility comments: Received sitting in recliner  Transfers Overall transfer level: Modified independent Equipment used: Rolling walker (2 wheeled) Transfers: Sit to/from Stand Sit to Stand: Modified independent (Device/Increase time)         General transfer comment: Mod indep standing from recliner and toilet to RW  Ambulation/Gait Ambulation/Gait assistance: Supervision Gait Distance (Feet): 200 Feet Assistive device: Rolling walker (2 wheeled) Gait Pattern/deviations: Step-through pattern;Decreased stride length;Wide base of support;Trunk flexed Gait velocity: Decreased Gait velocity interpretation: 1.31 - 2.62 ft/sec, indicative of  limited community ambulator General Gait Details: Slow, steady ambulation with RW and supervision for safety; 2x standing rest breaks secondary to fatigue and c/o back pain. SpO2 down to 84% on RA, returning to >90% on 3L O2 [place]  Stairs            Wheelchair Mobility    Modified Rankin (Stroke Patients Only)       Balance Overall balance assessment: Mild deficits observed, not formally tested                                           Pertinent Vitals/Pain Pain Assessment: 0-10 Pain Score: 9  Pain Location: back Pain Descriptors / Indicators: Constant;Discomfort;Guarding Pain Intervention(s): Limited activity within patient's tolerance;Monitored during session    [place] expects to be discharged to:: Private residence Living Arrangements: Children Available Help at Discharge: Family;Available PRN/intermittently Type of Home: House Home Access: Stairs to enter Entrance Stairs-Rails: Right Entrance Stairs-Number of Steps: 3 Home Layout: One level Home Equipment: Cane - single point;Bedside commode Additional Comments: Lives with daughter    Prior Function Level of Independence: Independent with assistive device(s)         Comments: Mod indep with SPC. Drives. Has been sponge bathing due to worsening back pain limiting mobility. Cooks simple meals. Wears slip on shoes as she cannot reach feet. Daughter intermittently assists     Hand Dominance   Dominant Hand: Right    Extremity/Trunk Assessment   Upper Extremity Assessment Upper Extremity Assessment: Overall WFL for tasks assessed    Lower Extremity Assessment Lower Extremity Assessment: Overall WFL for  tasks assessed    Cervical / Trunk Assessment Cervical / Trunk Assessment: Other exceptions Cervical / Trunk Exceptions: Chronic back pain and compression fxs  Communication   Communication: No difficulties  Cognition Arousal/Alertness: Awake/alert Behavior During  Therapy: Flat affect Overall Cognitive Status: Within Functional Limits for tasks assessed                                        General Comments      Exercises     Assessment/Plan    PT Assessment Patient needs continued PT services  PT Problem List Decreased activity tolerance;Decreased balance;Decreased mobility;Cardiopulmonary status limiting activity       PT Treatment Interventions DME instruction;Gait training;Stair training;Functional mobility training;Therapeutic activities;Therapeutic exercise;Balance training;Patient/family education    PT Goals (Current goals can be found in the Care Plan section)  Acute Rehab PT Goals Patient Stated Goal: Return home PT Goal Formulation: With patient Time For Goal Achievement: 10/14/18 Potential to Achieve Goals: Good    Frequency Min 3X/week   Barriers to discharge        Co-evaluation               AM-PAC PT "6 Clicks" Mobility  Outcome Measure Help needed turning from your back to your side while in a flat bed without using bedrails?: A Little Help needed moving from lying on your back to sitting on the side of a flat bed without using bedrails?: A Little Help needed moving to and from a bed to a chair (including a wheelchair)?: None Help needed standing up from a chair using your arms (e.g., wheelchair or bedside chair)?: None Help needed to walk in hospital room?: A Little Help needed climbing 3-5 steps with a railing? : A Little 6 Click Score: 20    End of Session Equipment Utilized During Treatment: Oxygen Activity Tolerance: Patient tolerated treatment well;Patient limited by pain Patient left: in chair;with call bell/phone within reach Nurse Communication: Mobility status PT Visit Diagnosis: Other abnormalities of gait and mobility (R26.89)    Time: 6010-9323 PT Time Calculation (min) (ACUTE ONLY): 14 min   Charges:   PT Evaluation $PT Eval Moderate Complexity: [place], PT, DPT Acute Rehabilitation Services  Pager [phone] Office [place] 09/30/2018, 9:25 AM

## 2018-10-01 ENCOUNTER — Inpatient Hospital Stay (HOSPITAL_COMMUNITY): Payer: Medicare Other

## 2018-10-01 DIAGNOSIS — L03116 Cellulitis of left lower limb: Secondary | ICD-10-CM

## 2018-10-01 DIAGNOSIS — L03115 Cellulitis of right lower limb: Secondary | ICD-10-CM

## 2018-10-01 DIAGNOSIS — R600 Localized edema: Secondary | ICD-10-CM

## 2018-10-01 DIAGNOSIS — I5031 Acute diastolic (congestive) heart failure: Secondary | ICD-10-CM

## 2018-10-01 DIAGNOSIS — Z6841 Body Mass Index (BMI) 40.0 and over, adult: Secondary | ICD-10-CM

## 2018-10-01 LAB — BASIC METABOLIC PANEL
Anion gap: 9 (ref 5–15)
BUN: 18 mg/dL (ref 8–23)
CO2: 32 mmol/L (ref 22–32)
Calcium: 8.6 mg/dL — ABNORMAL LOW (ref 8.9–10.3)
Chloride: 97 mmol/L — ABNORMAL LOW (ref 98–111)
Creatinine, Ser: 0.82 mg/dL (ref 0.44–1.00)
GFR calc Af Amer: >60 mL/min (ref >60)
GFR calc non Af Amer: >60 mL/min (ref >60)
Glucose, Bld: 113 mg/dL — ABNORMAL HIGH (ref 70–99)
Potassium: 3.6 mmol/L (ref 3.5–5.1)
Sodium: 138 mmol/L (ref 135–145)

## 2018-10-01 LAB — BLOOD GAS, ARTERIAL
Acid-Base Excess: 6.6 mmol/L — ABNORMAL HIGH (ref 0.0–2.0)
Bicarbonate: 31.1 mmol/L — ABNORMAL HIGH (ref 20.0–28.0)
Drawn by: 441371
FIO2: 21
O2 Saturation: 86.3 %
Patient temperature: 99
pCO2 arterial: 49.5 mmHg — ABNORMAL HIGH (ref 32.0–48.0)
pH, Arterial: 7.416 (ref 7.350–7.450)
pO2, Arterial: 54.1 mmHg — ABNORMAL LOW (ref 83.0–108.0)

## 2018-10-01 LAB — ECHOCARDIOGRAM COMPLETE
Height: 58 in
Weight: 3483.2 oz

## 2018-10-01 LAB — MAGNESIUM: Magnesium: 2 mg/dL (ref 1.7–2.4)

## 2018-10-01 MED ORDER — CEPHALEXIN 500 MG PO CAPS
500.0000 mg | ORAL_CAPSULE | Freq: Two times a day (BID) | ORAL | Status: AC
  Administered 2018-10-01 – 2018-10-05 (×9): 500 mg via ORAL
  Filled 2018-10-01: qty 2
  Filled 2018-10-01 (×3): qty 1
  Filled 2018-10-01 (×3): qty 2
  Filled 2018-10-01 (×2): qty 1
  Filled 2018-10-01: qty 2
  Filled 2018-10-01: qty 1
  Filled 2018-10-01: qty 2
  Filled 2018-10-01: qty 1
  Filled 2018-10-01: qty 2
  Filled 2018-10-01 (×2): qty 1
  Filled 2018-10-01 (×2): qty 2

## 2018-10-01 NOTE — Progress Notes (Signed)
Occupational Therapy Treatment and Discharge Patient Details Name: [name] MRN: [id number] DOB: [birth year] Today's Date: 10/01/2018    History of present illness Pt is a 68 y.o. woman admitted 09/29/18 with CHF exacerbation. PMH includes CKD, HTN, depression, compression fx, chronic back pain, obesity.   OT comments  Pt educated in energy conservation and given handout. Educated in use of AE for LB ADL. Pt verbalizing understanding.   Follow Up Recommendations  Home health OT    Equipment Recommendations  Tub/shower seat(pt will get on her own)    Recommendations for Other Services      Precautions / Restrictions Precautions Precautions: Fall Precaution Comments: H/o compression fxs; chronic back pain       Mobility Bed Mobility               General bed mobility comments: Received sitting in recliner  Transfers Overall transfer level: Modified independent                    Balance                                           ADL either performed or assessed with clinical judgement   ADL Overall ADL's : Needs assistance/impaired     Grooming: Wash/dry hands;Wash/dry face;Sitting;Set up         Lower Body Bathing Details (indicate cue type and reason): educated in use of long handled bath sponge, reacher     Lower Body Dressing: Set up;Sit to/from stand;With adaptive equipment Lower Body Dressing Details (indicate cue type and reason): educated in use of reacher and sock aid Toilet Transfer: Modified Independent;Stand-pivot;BSC   Toileting- Clothing Manipulation and Hygiene: Modified independent;Sit to/from Nurse, children's Details (indicate cue type and reason): educated in tub transfer bench vs carex tub seat   General ADL Comments: Instructed in energy conservation strategies and reinforced with handout.     Vision       Perception     Praxis      Cognition Arousal/Alertness:  Awake/alert Behavior During Therapy: Flat affect Overall Cognitive Status: Within Functional Limits for tasks assessed                                 General Comments: pt with depressed demeanor        Exercises     Shoulder Instructions       General Comments      Pertinent Vitals/ Pain       Pain Assessment: Faces Faces Pain Scale: Hurts even more Pain Location: back Pain Descriptors / Indicators: Constant;Discomfort;Guarding Pain Intervention(s): Monitored during session;Repositioned;Heat applied  Home Living                                          Prior Functioning/Environment              Frequency  Min 2X/week        Progress Toward Goals  OT Goals(current goals can now be found in the care plan section)  Progress towards OT goals: Progressing toward goals  Acute Rehab OT Goals Patient Stated Goal: Return home OT Goal Formulation: With patient Time For Goal Achievement:  10/14/18 Potential to Achieve Goals: Good  Plan Discharge plan remains appropriate    Co-evaluation                 AM-PAC OT "6 Clicks" Daily Activity     Outcome Measure   Help from another person eating meals?: None Help from another person taking care of personal grooming?: A Little Help from another person toileting, which includes using toliet, bedpan, or urinal?: A Little Help from another person bathing (including washing, rinsing, drying)?: A Little Help from another person to put on and taking off regular upper body clothing?: None Help from another person to put on and taking off regular lower body clothing?: A Little 6 Click Score: 20    End of Session    OT Visit Diagnosis: Pain(decreased activity tolerance)   Activity Tolerance Patient tolerated treatment well   Patient Left in chair;with call bell/phone within reach   Nurse Communication          Time: 0076-2263 OT Time Calculation (min): 36 min  Charges: OT  General Charges $OT Visit: 1 Visit OT Treatments $Self Care/Home Management : 23-37 mins  Nestor Lewandowsky, OTR/L Acute Rehabilitation Services Pager: [phone] Office: [phone]   [name] 10/01/2018, 9:38 AM

## 2018-10-01 NOTE — Progress Notes (Signed)
Orthopedic Tech Progress Note Patient Details:  [name] 07/19/1951 [id number]  Ortho Devices Type of Ortho Device: Haematologist Ortho Device/Splint Interventions: Ordered, Application, Adjustment   Post Interventions Patient Tolerated: Well Instructions Provided: Adjustment of device, Care of device   [name] [name] 10/01/2018, 6:26 PM

## 2018-10-01 NOTE — Progress Notes (Addendum)
Subjective: [name] is overall doing well this morning.  She states that her lower extremity edema has improved since yesterday.  She has minimal pain to her legs.  She did have issues with sleeping last night but says it has improved with ramelteon.  She was able to sleep 3 to 4 hours.  She does state that the increase in her methocarbamol did help with her muscle spasms.  She is only having shortness of breath whenever she walks around.  Objective:  Vital signs in last 24 hours: Vitals:   10/01/18 0106 10/01/18 0504 10/01/18 0538 10/01/18 0947  BP: (!) 123/55 112/67  (!) 107/56  Pulse: 86 89    Resp: 20 20    Temp: 99.3 F (37.4 C) 99.7 F (37.6 C) 99 F (37.2 C)   TempSrc: Oral Oral Oral   SpO2: 92% 91%    Weight:  98.7 kg    Height:       General: Morbidly obese female sitting up in the chair no acute distress Cardiovascular: Difficult to appreciate heart sounds due to large body habitus Respiratory: No respiratory distress, on room air, cannot appreciate lung sounds due to large body habitus MSK: Both lower extremities are wrapped.  1+ pitting edema to lower extremities bilaterally.  Edema improved from yesterday.  Assessment/Plan:  Principal Problem:   Acute diastolic congestive heart failure (HCC) Active Problems:   HTN (hypertension)   Idiopathic thrombocytopenic purpura (HCC)   Chronic back pain   Venous stasis dermatitis   Wound of lower extremity, bilateral   Cellulitis of leg, right  [name] hasHFpEF, CKD,HTN, and chronic back pain who was admitted for treatment of lower extremity cellulitis and acute on chronic heart failure exacerbation.   LE Cellulitis vs venous stasis dermatitis:  Working with wound care.  On day 2 of Ancef with improvement in her lower extremity erythema. LE swelling has also improved with Lasix therapy.  Attempted to get ABIs yesterday but patient could not lie down for the procedure.  We will hold off getting ABIs for now. -  Discontinue cefazolin - Start cephalexin 500 mg every 12 hours -Place [place]na boots today -Continue Lasix 80 mg BID -Continue homeAcetaminophen 650 mg every 6 hours PRN pain  -Continue homePercocet 5-3 25 2  tablets every 8 hours PRN pain  Acute on chronicdiastoliccongestive heart failure exacerbation:  She is responding well to Lasix therapy.  She is no longer on supplemental oxygen and is net -1 L today.  We will continue Lasix.  She will get an echo today to evaluate for right heart failure (she has mostly right heart disease symptoms: LE edema and weight gain without pleural edema).  -- Continue Lasix 80 mg BID -- Follow-up echo -- Continuous cardiac monitoring  - Continue home amlodipine 5 mg daily, carvedilol 6.25 mg twice daily, lisinopril 2.5 mg daily - Strict I's and O's - Daily weights  Hypertension: Blood pressure within normal limits on home medications. - Continue amlodipine, carvedilol, lisinopril, and Lasix per above  ITP: Stable.  Chronic Back Pain 2/2 compression fracture s/p repair in 2015:  Muscle spasms improved with increased methocarbamol dose..  -  Continue acetaminophen and Percocet per above -  Continue methocarbamol 1000 mg every 6 hours PRN muscle spasms  FEN/GI:heart healthy, no IVFs DVT PPX: Do not want to give her anti-coagulation due to low platelets. Cannot place SCDs because of LE cellulitis. CODE STATUS: FULL CODE  Dispo: Anticipated discharge in approximately 1-2 days.  Nita Sickle  M, MD 10/01/2018, 11:21 AM Pager: [phone]

## 2018-10-01 NOTE — Care Management Important Message (Signed)
Important Message  Patient Details  Name: [name] MRN: [id number] Date of Birth: August 24, 1951   Medicare Important Message Given:  Yes    Delorse Lek 10/01/2018, 4:56 PM

## 2018-10-01 NOTE — Progress Notes (Signed)
Physical Therapy Treatment Patient Details Name: [name] MRN: [id number] DOB: February 13, 1951 Today's Date: 10/01/2018    History of Present Illness Pt is a 68 y.o. woman admitted 09/29/18 with CHF exacerbation. PMH includes CKD, HTN, depression, compression fx, chronic back pain, obesity.   PT Comments    Pt progressing with mobility. Mod indep ambulating with RW. SpO2 down to 87% on RA, returning to >/88% with standing rest and deep breathing. Pt has been sedentary this hospital admission and reports sedentary lifestyle at home; increased time spent educating on importance of frequent mobility including activity recommendations with CHF. Pt has met short-term acute PT goals; has no further questions or concerns. Encouraged to continue ambulating during hospital admission; will require assist from staff to don socks.    Follow Up Recommendations  No PT follow up;Supervision - Intermittent     Equipment Recommendations  (youth-sized rolling walker)    Recommendations for Other Services       Precautions / Restrictions Precautions Precautions: Fall Precaution Comments: H/o compression fxs; chronic back pain Restrictions Weight Bearing Restrictions: No    Mobility  Bed Mobility               General bed mobility comments: Received sitting in recliner  Transfers Overall transfer level: Modified independent Equipment used: Rolling walker (2 wheeled)                Ambulation/Gait Ambulation/Gait assistance: Modified independent (Device/Increase time) Gait Distance (Feet): 350 Feet Assistive device: Rolling walker (2 wheeled) Gait Pattern/deviations: Step-through pattern;Decreased stride length;Wide base of support;Trunk flexed Gait velocity: Decreased Gait velocity interpretation: 1.31 - 2.62 ft/sec, indicative of limited community ambulator General Gait Details: Slow, steady, antalgic gait mod indep with RW; required intermittent standing rest breaks with  trunk flexed over RW secondary to back pain. SpO2 down to 87% on RA, returning to >88% with rest and deep breathing   Stairs             Wheelchair Mobility    Modified Rankin (Stroke Patients Only)       Balance Overall balance assessment: Needs assistance   Sitting balance-Leahy Scale: Fair       Standing balance-Leahy Scale: Fair Standing balance comment: Can static stand and take steps without UE support                            Cognition Arousal/Alertness: Awake/alert Behavior During Therapy: WFL for tasks assessed/performed Overall Cognitive Status: Within Functional Limits for tasks assessed                                        Exercises      General Comments        Pertinent Vitals/Pain Pain Assessment: Faces Faces Pain Scale: Hurts little more Pain Location: back Pain Descriptors / Indicators: Constant;Discomfort;Guarding Pain Intervention(s): Limited activity within patient's tolerance;Monitored during session    Home Living                      Prior Function            PT Goals (current goals can now be found in the care plan section) Acute Rehab PT Goals Patient Stated Goal: Return home PT Goal Formulation: With patient Time For Goal Achievement: 10/14/18 Potential to Achieve Goals: Good Progress towards PT goals: Progressing  toward goals    Frequency    Min 3X/week      PT Plan Current plan remains appropriate    Co-evaluation              AM-PAC PT "6 Clicks" Mobility   Outcome Measure  Help needed turning from your back to your side while in a flat bed without using bedrails?: A Little Help needed moving from lying on your back to sitting on the side of a flat bed without using bedrails?: A Little Help needed moving to and from a bed to a chair (including a wheelchair)?: None Help needed standing up from a chair using your arms (e.g., wheelchair or bedside chair)?:  None Help needed to walk in hospital room?: None Help needed climbing 3-5 steps with a railing? : A Little 6 Click Score: 21    End of Session   Activity Tolerance: Patient tolerated treatment well;Patient limited by pain Patient left: in chair;with call bell/phone within reach Nurse Communication: Mobility status PT Visit Diagnosis: Other abnormalities of gait and mobility (R26.89)     Time: 7262-0355 PT Time Calculation (min) (ACUTE ONLY): 25 min  Charges:  $Gait Training: 8-22 mins $Self Care/Home Management: 8-22                    Mabeline Caras, PT, DPT Acute Rehabilitation Services  Pager [phone] Office [phone]  Derry Lory 10/01/2018, 4:07 PM

## 2018-10-02 LAB — BASIC METABOLIC PANEL
Anion gap: 13 (ref 5–15)
BUN: 22 mg/dL (ref 8–23)
CO2: 29 mmol/L (ref 22–32)
Calcium: 8.8 mg/dL — ABNORMAL LOW (ref 8.9–10.3)
Chloride: 95 mmol/L — ABNORMAL LOW (ref 98–111)
Creatinine, Ser: 0.7 mg/dL (ref 0.44–1.00)
GFR calc Af Amer: >60 mL/min (ref >60)
GFR calc non Af Amer: >60 mL/min (ref >60)
Glucose, Bld: 127 mg/dL — ABNORMAL HIGH (ref 70–99)
Potassium: 4.1 mmol/L (ref 3.5–5.1)
Sodium: 137 mmol/L (ref 135–145)

## 2018-10-02 LAB — PHOSPHORUS: Phosphorus: 3.9 mg/dL (ref 2.5–4.6)

## 2018-10-02 LAB — TSH: TSH: 2.215 u[IU]/mL (ref 0.350–4.500)

## 2018-10-02 LAB — MAGNESIUM: Magnesium: 2.2 mg/dL (ref 1.7–2.4)

## 2018-10-02 MED ORDER — ONDANSETRON HCL 4 MG/2ML IJ SOLN
4.0000 mg | Freq: Once | INTRAMUSCULAR | Status: AC
  Administered 2018-10-02: 4 mg via INTRAVENOUS
  Filled 2018-10-02: qty 2

## 2018-10-02 MED ORDER — BISACODYL 10 MG RE SUPP
10.0000 mg | Freq: Every day | RECTAL | Status: DC | PRN
  Administered 2018-10-02: 10 mg via RECTAL
  Filled 2018-10-02: qty 1

## 2018-10-02 MED ORDER — SALINE SPRAY 0.65 % NA SOLN
1.0000 | NASAL | Status: DC | PRN
  Filled 2018-10-02: qty 44

## 2018-10-02 MED ORDER — ALUM & MAG HYDROXIDE-SIMETH 200-200-20 MG/5ML PO SUSP
30.0000 mL | ORAL | Status: DC | PRN
  Administered 2018-10-02 (×2): 30 mL via ORAL
  Filled 2018-10-02 (×2): qty 30

## 2018-10-02 NOTE — Progress Notes (Signed)
Medicine attending: I examined this patient today together with resident physician Dr. Isac Sarna and I concur with her evaluation and management plan. Patient admitted with acute on chronic diastolic heart failure.  Likely partially due to noncompliance with full doses of her outpatient diuretics.  Hospital weights have been erratic.  She is diuresing well. Bedside echocardiogram done yesterday, January 17, shows normal left ventricular systolic function, EF 14-97%, grade 1 diastolic dysfunction, right ventricle poorly visualized but appeared mildly dilated with reduced function.  No major valve abnormalities.  Pulmonary artery pressure not estimated.  IVC not visualized. Arterial blood gas does show CO2 retention with PCO2 49.5, PO2 54, pH 7.4.  Done on room air. She clinically has predominantly right-sided heart failure.  Suspect obesity sleep apnea syndrome based on body habitus, predominant peripheral edema, echocardiogram findings, and blood gas. Continue current management plan.

## 2018-10-02 NOTE — Progress Notes (Signed)
   Subjective:  [name] was doing well this morning. She continues to complain of chronic back pain, but her breathing is somewhat better. She also complained of the hospital food.   Objective:  Vital signs in last 24 hours: Vitals:   10/01/18 1858 10/01/18 1954 10/02/18 0344 10/02/18 1003  BP: (!) 104/55 (!) 114/59 (!) 114/59 134/78  Pulse: 81 77 76   Resp:  18 18   Temp:  98.9 F (37.2 C) 98.6 F (37 C)   TempSrc:  Oral Oral   SpO2: 94% 94% 93%   Weight:   99 kg   Height:       General: Chronically ill-appearing female, sitting up in chair, no acute distress CV: RRR, no mrg  Pulm: Clear to auscultation bilaterally Ext: Unna boots bilaterally, 1+ pitting edema noted bilaterally  Assessment/Plan:  Principal Problem:   Acute diastolic congestive heart failure (HCC) Active Problems:   HTN (hypertension)   Idiopathic thrombocytopenic purpura (HCC)   Chronic back pain   Venous stasis dermatitis   Wound of lower extremity, bilateral   Cellulitis of leg, right   BMI 45.0-49.9, adult (HCC)   Bilateral lower extremity edema  [name] hasHFpEF, CKD,HTN, and chronic back pain who was admitted for treatment of lower extremity cellulitis and acute on chronic heart failure exacerbation.   # Acute on chronicdiastoliccongestive heart failure exacerbation:  She continues to diurese well on IV Lasix as below.  Remains on 2 L of oxygen though I suspect this may be from underlying pulmonary hypertension versus pulmonary edema (none seen on CXR). Dry weight 204 lbs, currently at 218 though this may be inaccurate.  -- Continue Lasix 80 mg BID - Continue home amlodipine 5 mg daily, carvedilol 6.25 mg twice daily, lisinopril 2.5 mg daily - Strict I's and O's - Daily weights  # Moderate RV failure: RV poorly visualized on yesterday's echocardiogram, but did show a mildly dilated RV with mild to moderately reduced systolic function as well as mild pulmonary hypertension.  Suspect  this is secondary to OSA/OSH given patient's body habitus as well as ABG showing CO2 retention.  Will need outpatient sleep study.   # Chronic LE wounds 2/2 venous stasis dermatitis complicated by RLE non-purulent cellulitis:  Bilateral Unna boots placed yesterday. She is also on day 4 of antibiotics, switched to Keflex yesterday.  - Continue cephalexin 500 mg every 12 hours  # Hypertension: Blood pressure within normal limits on home medications. - Continue amlodipine, carvedilol, lisinopril, and Lasix per above  # ITP: Stable.  # Chronic Back Pain 2/2 compression fracture s/p repair in 2015:    -  Continue acetaminophen and home Percocet -  Continue methocarbamol 1000 mg every 6 hours PRN muscle spasms   Dispo: Anticipated discharge in approximately 2-3 days pending improvement in volume status.  Welford Roche, MD 10/02/2018, 10:28 AM Pager: [phone]

## 2018-10-03 LAB — MAGNESIUM: Magnesium: 2.6 mg/dL — ABNORMAL HIGH (ref 1.7–2.4)

## 2018-10-03 LAB — BASIC METABOLIC PANEL
Anion gap: 13 (ref 5–15)
BUN: 25 mg/dL — ABNORMAL HIGH (ref 8–23)
CO2: 34 mmol/L — ABNORMAL HIGH (ref 22–32)
Calcium: 9.1 mg/dL (ref 8.9–10.3)
Chloride: 88 mmol/L — ABNORMAL LOW (ref 98–111)
Creatinine, Ser: 1.06 mg/dL — ABNORMAL HIGH (ref 0.44–1.00)
GFR calc Af Amer: >60 mL/min (ref >60)
GFR calc non Af Amer: 55 mL/min — ABNORMAL LOW (ref >60)
Glucose, Bld: 139 mg/dL — ABNORMAL HIGH (ref 70–99)
Potassium: 4.1 mmol/L (ref 3.5–5.1)
Sodium: 135 mmol/L (ref 135–145)

## 2018-10-03 LAB — CBC
HCT: 56.2 % — ABNORMAL HIGH (ref 36.0–46.0)
Hemoglobin: 17.4 g/dL — ABNORMAL HIGH (ref 12.0–15.0)
MCH: 27.9 pg (ref 26.0–34.0)
MCHC: 31 g/dL (ref 30.0–36.0)
MCV: 90.1 fL (ref 80.0–100.0)
Platelets: 68 10*3/uL — ABNORMAL LOW (ref 150–400)
RBC: 6.24 MIL/uL — ABNORMAL HIGH (ref 3.87–5.11)
RDW: 17.2 % — ABNORMAL HIGH (ref 11.5–15.5)
WBC: 17.9 10*3/uL — ABNORMAL HIGH (ref 4.0–10.5)
nRBC: 0 % (ref 0.0–0.2)

## 2018-10-03 MED ORDER — FUROSEMIDE 80 MG PO TABS
80.0000 mg | ORAL_TABLET | Freq: Two times a day (BID) | ORAL | Status: DC
  Filled 2018-10-03 (×2): qty 1

## 2018-10-03 MED ORDER — ONDANSETRON HCL 4 MG/2ML IJ SOLN: 4.0000 mg | Freq: Four times a day (QID) | INTRAMUSCULAR | Status: DC | PRN

## 2018-10-03 MED ORDER — ONDANSETRON HCL 4 MG PO TABS: 4.0000 mg | ORAL_TABLET | Freq: Four times a day (QID) | ORAL | Status: DC | PRN

## 2018-10-03 MED ORDER — GUAIFENESIN 100 MG/5ML PO SOLN: 10.0000 mL | ORAL | Status: DC | PRN

## 2018-10-03 MED ORDER — PROMETHAZINE HCL 25 MG PO TABS
12.5000 mg | ORAL_TABLET | Freq: Four times a day (QID) | ORAL | Status: DC | PRN
  Administered 2018-10-03: 12.5 mg via ORAL
  Filled 2018-10-03: qty 1

## 2018-10-03 MED ORDER — SENNOSIDES-DOCUSATE SODIUM 8.6-50 MG PO TABS
2.0000 | ORAL_TABLET | Freq: Two times a day (BID) | ORAL | Status: DC
  Administered 2018-10-03 – 2018-10-06 (×8): 2 via ORAL
  Filled 2018-10-03 (×10): qty 2

## 2018-10-03 MED ORDER — PROMETHAZINE HCL 25 MG/ML IJ SOLN
12.5000 mg | Freq: Four times a day (QID) | INTRAMUSCULAR | Status: DC | PRN
  Administered 2018-10-03 – 2018-10-06 (×10): 12.5 mg via INTRAVENOUS
  Filled 2018-10-03 (×10): qty 1

## 2018-10-03 MED ORDER — ONDANSETRON HCL 4 MG/2ML IJ SOLN
4.0000 mg | Freq: Four times a day (QID) | INTRAMUSCULAR | Status: DC | PRN
  Administered 2018-10-03 (×2): 4 mg via INTRAVENOUS
  Filled 2018-10-03 (×2): qty 2

## 2018-10-03 MED ORDER — METHOCARBAMOL 500 MG PO TABS
500.0000 mg | ORAL_TABLET | Freq: Four times a day (QID) | ORAL | Status: DC | PRN
  Administered 2018-10-03 – 2018-10-05 (×5): 500 mg via ORAL
  Filled 2018-10-03 (×5): qty 1

## 2018-10-03 NOTE — Plan of Care (Signed)
  Problem: Clinical Measurements: Goal: Will remain free from infection Outcome: Progressing   Problem: Clinical Measurements: Goal: Respiratory complications will improve Outcome: Progressing   Problem: Clinical Measurements: Goal: Cardiovascular complication will be avoided Outcome: Progressing   Problem: Nutrition: Goal: Adequate nutrition will be maintained Outcome: Progressing   

## 2018-10-03 NOTE — Progress Notes (Signed)
Medicine attending: Clinical status and lab database reviewed with resident physician Dr. Nita Sickle and I concur with her evaluation and management plan. 68 year old woman being treated for acute on chronic diastolic heart failure likely related to obesity sleep apnea syndrome with right-sided heart failure, hypoxia, and CO2 retention.  BUN and creatinine now rising day for parenteral diuretics.  We will hold diuretics today.  Reevaluate for transition to oral diuretics if renal function otherwise stable tomorrow.

## 2018-10-03 NOTE — Progress Notes (Signed)
Call placed to internal medicine to verify PO order for lasix, as pt recalls being told she would not receive lasix today and requested clarification/verification of order.  Per MD Frederico Hamman- change of plan, vs no lasix she will return to home dose of lasix. Will relay to pt.

## 2018-10-03 NOTE — Progress Notes (Signed)
Report received on patient from [facility]. Pt c/o continued nausea, reports discomfort but is hesitant to take pain medication due to the continued nausea.

## 2018-10-03 NOTE — Progress Notes (Addendum)
Subjective:  [name] complains of nausea today. She said she has not vomited but has "dry heaved" and this causes her back to hurt even worse. She stated that she had a suppository yesterday but this did not help much and mainly relieved gas. She does not complain of increased SOB, chest pain, or leg pain.   Objective:  Vital signs in last 24 hours: Vitals:   10/02/18 1003 10/02/18 1413 10/02/18 2010 10/03/18 0538  BP: 134/78 117/61 103/70 120/64  Pulse:  90 88 85  Resp:  18 18 18   Temp:  98.8 F (37.1 C) 98.7 F (37.1 C) 98.7 F (37.1 C)  TempSrc:  Oral Oral Oral  SpO2:  93% 95% 92%  Weight:      Height:       General: Chronically ill-appearing female, sitting up in chair, no acute distress CV: RRR, no mrg  Pulm: Clear to auscultation bilaterally Abdom: bowel sounds decreased, possible distension but hard to tell with body habitus.  Ext: Unna boots bilaterally, 1+ pitting edema in bilateral feet, no pitting noted above Unna boots  Assessment/Plan:  Principal Problem:   Acute diastolic congestive heart failure (HCC) Active Problems:   HTN (hypertension)   Idiopathic thrombocytopenic purpura (HCC)   Chronic back pain   Venous stasis dermatitis   Wound of lower extremity, bilateral   Cellulitis of lower extremity   BMI 45.0-49.9, adult ([place])   Bilateral lower extremity edema  [name] hasHFpEF, CKD,HTN, and chronic back pain who was admitted for treatment of lower extremity cellulitis and acute on chronic heart failure exacerbation.   # Acute on chronicdiastoliccongestive heart failure exacerbation: Cr up today at 1.06 from .7 yesterday. Physical exam indicates she is dry, noting no pitting edema above Unna boots, lips look cracked externally, no JVD on exam but difficult to appreciate with body habitus, and no crackles on lung exam. Will hold Lasix given her labs and entire clinical picture indicating low fluid status on exam. Dry weight 204 lbs, currently at  218 though this may be inaccurate.  -- Hold Lasix today and will reassess tomorrow - Continue home amlodipine 5 mg daily, carvedilol 6.25 mg twice daily, lisinopril 2.5 mg daily - Strict I's and O's - Daily weights - Ambulate with pulse ox  # Moderate RV failure: Echo showed RV systolic dysfunction. Most likely due to untreated OSA. She will need a outpatient sleep study.    # Chronic LE wounds 2/2 venous stasis dermatitis complicated by RLE non-purulent cellulitis:  Bilateral Unna boots on today. She is also on day 5/7 of antibiotics, switched to Keflex for remainder of abx course.   - Continue cephalexin 500 mg every 12 hours  # Hypertension: Blood pressure within normal limits on home medications. - Continue amlodipine, carvedilol, lisinopril - holding lasix today  # ITP: Stable.  # Chronic Back Pain 2/2 compression fracture s/p repair in 2015:    -  Continue acetaminophen and home Percocet -  Continue methocarbamol 1000 mg every 6 hours PRN muscle spasms  # Constipation and Nausea:  - Continue Miralax - Continue Bisacodyl 10 mg suppository daily PRN  - Start senokot 2 tabs BID - Change zofran to Phenergan 12.5 mg IV for nausea.   Dispo: Anticipated discharge in approximately 2-3 days pending improvement in volume status.  Synetta Shadow, Medical Student 10/03/2018, 7:22 AM Pager: [phone]  Attestation for Student Documentation:  I personally was present and performed or re-performed the history, physical exam and medical  decision-making activities of this service and have verified that the service and findings are accurately documented in the student's note.  Carroll Sage, MD 10/03/2018, 9:59 AM

## 2018-10-04 DIAGNOSIS — N179 Acute kidney failure, unspecified: Secondary | ICD-10-CM

## 2018-10-04 DIAGNOSIS — K5900 Constipation, unspecified: Secondary | ICD-10-CM

## 2018-10-04 DIAGNOSIS — E662 Morbid (severe) obesity with alveolar hypoventilation: Secondary | ICD-10-CM | POA: Diagnosis present

## 2018-10-04 LAB — BASIC METABOLIC PANEL
Anion gap: 9 (ref 5–15)
BUN: 37 mg/dL — ABNORMAL HIGH (ref 8–23)
CO2: 31 mmol/L (ref 22–32)
Calcium: 8.5 mg/dL — ABNORMAL LOW (ref 8.9–10.3)
Chloride: 91 mmol/L — ABNORMAL LOW (ref 98–111)
Creatinine, Ser: 1.31 mg/dL — ABNORMAL HIGH (ref 0.44–1.00)
GFR calc Af Amer: 49 mL/min — ABNORMAL LOW (ref >60)
GFR calc non Af Amer: 43 mL/min — ABNORMAL LOW (ref >60)
Glucose, Bld: 115 mg/dL — ABNORMAL HIGH (ref 70–99)
Potassium: 4.4 mmol/L (ref 3.5–5.1)
Sodium: 131 mmol/L — ABNORMAL LOW (ref 135–145)

## 2018-10-04 LAB — CBC WITH DIFFERENTIAL/PLATELET
Abs Immature Granulocytes: 0.1 10*3/uL — ABNORMAL HIGH (ref 0.00–0.07)
Basophils Absolute: 0.1 10*3/uL (ref 0.0–0.1)
Basophils Relative: 0 %
Eosinophils Absolute: 1 10*3/uL — ABNORMAL HIGH (ref 0.0–0.5)
Eosinophils Relative: 6 %
HCT: 48.2 % — ABNORMAL HIGH (ref 36.0–46.0)
Hemoglobin: 15 g/dL (ref 12.0–15.0)
Immature Granulocytes: 1 %
Lymphocytes Relative: 12 %
Lymphs Abs: 1.9 10*3/uL (ref 0.7–4.0)
MCH: 27.4 pg (ref 26.0–34.0)
MCHC: 31.1 g/dL (ref 30.0–36.0)
MCV: 88.1 fL (ref 80.0–100.0)
Monocytes Absolute: 1.4 10*3/uL — ABNORMAL HIGH (ref 0.1–1.0)
Monocytes Relative: 9 %
Neutro Abs: 11 10*3/uL — ABNORMAL HIGH (ref 1.7–7.7)
Neutrophils Relative %: 72 %
Platelets: 58 10*3/uL — ABNORMAL LOW (ref 150–400)
RBC: 5.47 MIL/uL — ABNORMAL HIGH (ref 3.87–5.11)
RDW: 16.4 % — ABNORMAL HIGH (ref 11.5–15.5)
WBC: 15.3 10*3/uL — ABNORMAL HIGH (ref 4.0–10.5)
nRBC: 0 % (ref 0.0–0.2)

## 2018-10-04 LAB — MAGNESIUM: Magnesium: 2.6 mg/dL — ABNORMAL HIGH (ref 1.7–2.4)

## 2018-10-04 MED ORDER — SODIUM CHLORIDE 0.9 % IV SOLN
INTRAVENOUS | Status: AC
  Administered 2018-10-04: 10:00:00 via INTRAVENOUS

## 2018-10-04 NOTE — Progress Notes (Signed)
Occupational Therapy Treatment Patient Details Name: [name] MRN: [id number] DOB: [birth year] Today's Date: 10/04/2018    History of present illness Pt is a 68 y.o. woman admitted 09/29/18 with CHF exacerbation. PMH includes CKD, HTN, depression, compression fx, chronic back pain, obesity.   OT comments  Pt is knowledgeable in use of sock aid and has tongs she uses as reacher and long handle bath sponge. Pt able to recall energy conservation strategies and educated in pursed lip breathing this visit. Sp02 86% on RA, increased to 90% with deep breathing. Pt reports less back pain this visit.   Follow Up Recommendations  No OT follow up    Equipment Recommendations  (Rollator vs RW (youth size))    Recommendations for Other Services      Precautions / Restrictions Precautions Precautions: Fall Precaution Comments: H/o compression fxs; chronic back pain, watch sats       Mobility Bed Mobility               General bed mobility comments: Received sitting in recliner  Transfers Overall transfer level: Modified independent Equipment used: Rolling walker (2 wheeled)                  Balance Overall balance assessment: Needs assistance   Sitting balance-Leahy Scale: Good       Standing balance-Leahy Scale: Fair                             ADL either performed or assessed with clinical judgement   ADL Overall ADL's : Needs assistance/impaired     Grooming: Wash/dry hands;Oral care;Standing;Set up;Minimal assistance Grooming Details (indicate cue type and reason): educated in benefits of rollator for sitting at sink/use in kitchen         Upper Body Dressing : Set up;Sitting       Toilet Transfer: Modified Independent;Stand-pivot;BSC   Toileting- Clothing Manipulation and Hygiene: Modified independent;Sit to/from stand         General ADL Comments: Educated in breathing techniques and benefits of rollator vs RW.     Vision        Perception     Praxis      Cognition Arousal/Alertness: Awake/alert Behavior During Therapy: Flat affect Overall Cognitive Status: Within Functional Limits for tasks assessed                                 General Comments: pt able to recall energy conservation strategies from last visit        Exercises     Shoulder Instructions       General Comments      Pertinent Vitals/ Pain       Pain Assessment: Faces Faces Pain Scale: Hurts little more Pain Location: back Pain Descriptors / Indicators: Constant;Discomfort Pain Intervention(s): Monitored during session;Premedicated before session;Repositioned  Home Living                                          Prior Functioning/Environment              Frequency  Min 2X/week        Progress Toward Goals  OT Goals(current goals can now be found in the care plan section)  Progress towards OT goals: Progressing toward goals  Acute Rehab OT Goals Patient Stated Goal: Return home OT Goal Formulation: With patient Time For Goal Achievement: 10/14/18 Potential to Achieve Goals: Good  Plan Other (comment)(RN aware pt's Sp02 86% on RA, to 90 with deep breaths)    Co-evaluation                 AM-PAC OT "6 Clicks" Daily Activity     Outcome Measure   Help from another person eating meals?: None Help from another person taking care of personal grooming?: A Little Help from another person toileting, which includes using toliet, bedpan, or urinal?: None Help from another person bathing (including washing, rinsing, drying)?: A Little Help from another person to put on and taking off regular upper body clothing?: None Help from another person to put on and taking off regular lower body clothing?: A Little 6 Click Score: 21    End of Session Equipment Utilized During Treatment: Rolling walker  OT Visit Diagnosis: Other abnormalities of gait and mobility (R26.89);Pain    Activity Tolerance Patient limited by fatigue   Patient Left in chair;with call bell/phone within reach   Nurse Communication          Time: 1157-2620 OT Time Calculation (min): 20 min  Charges: OT General Charges $OT Visit: 1 Visit OT Treatments $Self Care/Home Management : 8-22 mins  Nestor Lewandowsky, OTR/L Acute Rehabilitation Services Pager: [phone] Office: [phone]   Malka So 10/04/2018, 11:30 AM

## 2018-10-04 NOTE — Progress Notes (Signed)
Medicine attending: I examined this patient today together with resident physician Dr. Nita Sickle and I concur with her evaluation and management plan. Blood pressures running low.  Likely overdiuresis.  Further increase in BUN and creatinine.  Parenteral diuretics discontinued.  All diuretics on hold. We will give her gentle hydration today.  Reevaluate appropriate oral diuretics once blood pressure and renal function stabilized. General exam unchanged.  Lungs are clear.  No JVD.  Regular cardiac rhythm.  No murmur.  Chronic 3+ brawny edema of the lower extremities.

## 2018-10-04 NOTE — Progress Notes (Signed)
RN held Carvedilol medication. BP 92/43. MD made aware.

## 2018-10-04 NOTE — Progress Notes (Signed)
   Subjective: Mr. Manuelito states that she is doing a little better than yesterday. Her breathing is about the same and she is still on supplemental oxygen. She was not ambulated with pulse ox yesterday. She denies any chest pain or abdominal pain. She does state that she feels bloated today. Her congestion has improved.  Objective:  Vital signs in last 24 hours: Vitals:   10/04/18 0409 10/04/18 0412 10/04/18 0747 10/04/18 0748  BP:  95/63 (!) 88/52 (!) 88/52  Pulse:  72 67   Resp:  18    Temp:  (!) 97.4 F (36.3 C)    TempSrc:  Oral    SpO2:  93%    Weight: 99.3 kg     Height:       General: Morbidly obese female sitting up in chair in no acute distress. Cardiovascular: difficult to appreciate heart sounds due to large body habitus. Resp: Normal WOB, CTAB MSK: Unna boots on, no LE edema above Unna boots right under knee.  Assessment/Plan:  Principal Problem:   Acute diastolic congestive heart failure (HCC) Active Problems:   HTN (hypertension)   Idiopathic thrombocytopenic purpura (HCC)   Chronic back pain   Venous stasis dermatitis   Wound of lower extremity, bilateral   Cellulitis of lower extremity   BMI 45.0-49.9, adult ([place])   Bilateral lower extremity edema  Ms. Shuart hasHFpEF, CKD,HTN, and chronic back pain who was admitted for treatment of lower extremity cellulitis and acute on chronic heart failure exacerbation.   # Acute on chronicdiastoliccongestive heart failure exacerbation:  She does appear to be euvolemic on exam.  I/O: +260 mL. Creatinine increased to 1.31 and BUN 37 (1.06/25 respectively yesterday).  I do believe that she has been given an adequate amount of Lasix and is likely to dry at this point.  We will give her a half a liter of normal saline today and hold p.o. Lasix.   -- HoldLasix today - IVFs per above -Continue home amlodipine 5 mg daily, carvedilol 6.25 mg twice daily, lisinopril 2.5 mg daily -Strict I's and O's -Daily  weights -Ambulate with pulse ox  # Moderate RV failure: Echo showed RV systolic dysfunction. Most likely due to untreated OSA. She will need a outpatient sleep study.    # Chronic LE wounds 2/2 venous stasis dermatitis complicated by RLE non-purulent cellulitis: Bilateral Unna boots on today. She is on Keflex, day 6/7. Last dose tomorrow. - Continue cephalexin 500 mg every 12 hours  # Hypertension: Blood pressure soft today. Will give fluids per above and hold Lasix. -Continue amlodipine, carvedilol, lisinopril. - holding lasix today.  # ITP: Stable at 58,000 today.  # Chronic Back Pain 2/2 compression fracture s/p repair in 2015:  - Continue acetaminophen and home Percocet - Continue methocarbamol1000 mg every 6 hours PRN muscle spasms  # Constipation:  - Continue Miralax - Continue Bisacodyl 10 mg suppository daily PRN  - Continue senokot 2 tabs BID - Continue Phenergan 12.5 mg IV for nausea.   Dispo: Anticipated discharge in approximately 1-2 days.  Carroll Sage, MD 10/04/2018, 10:35 AM Pager: [phone]

## 2018-10-05 DIAGNOSIS — I272 Pulmonary hypertension, unspecified: Secondary | ICD-10-CM

## 2018-10-05 DIAGNOSIS — I872 Venous insufficiency (chronic) (peripheral): Secondary | ICD-10-CM

## 2018-10-05 DIAGNOSIS — D751 Secondary polycythemia: Secondary | ICD-10-CM | POA: Diagnosis present

## 2018-10-05 DIAGNOSIS — K5903 Drug induced constipation: Secondary | ICD-10-CM

## 2018-10-05 DIAGNOSIS — T402X5A Adverse effect of other opioids, initial encounter: Secondary | ICD-10-CM

## 2018-10-05 DIAGNOSIS — I50810 Right heart failure, unspecified: Secondary | ICD-10-CM

## 2018-10-05 DIAGNOSIS — E662 Morbid (severe) obesity with alveolar hypoventilation: Secondary | ICD-10-CM

## 2018-10-05 LAB — BASIC METABOLIC PANEL
Anion gap: 8 (ref 5–15)
BUN: 40 mg/dL — ABNORMAL HIGH (ref 8–23)
CO2: 34 mmol/L — ABNORMAL HIGH (ref 22–32)
Calcium: 8.9 mg/dL (ref 8.9–10.3)
Chloride: 89 mmol/L — ABNORMAL LOW (ref 98–111)
Creatinine, Ser: 1.02 mg/dL — ABNORMAL HIGH (ref 0.44–1.00)
GFR calc Af Amer: >60 mL/min (ref >60)
GFR calc non Af Amer: 58 mL/min — ABNORMAL LOW (ref >60)
Glucose, Bld: 115 mg/dL — ABNORMAL HIGH (ref 70–99)
Potassium: 4.6 mmol/L (ref 3.5–5.1)
Sodium: 131 mmol/L — ABNORMAL LOW (ref 135–145)

## 2018-10-05 LAB — CBC WITH DIFFERENTIAL/PLATELET
Abs Immature Granulocytes: 0.05 10*3/uL (ref 0.00–0.07)
Basophils Absolute: 0 10*3/uL (ref 0.0–0.1)
Basophils Relative: 0 %
Eosinophils Absolute: 0.9 10*3/uL — ABNORMAL HIGH (ref 0.0–0.5)
Eosinophils Relative: 7 %
HCT: 46.6 % — ABNORMAL HIGH (ref 36.0–46.0)
Hemoglobin: 14.9 g/dL (ref 12.0–15.0)
Immature Granulocytes: 0 %
Lymphocytes Relative: 14 %
Lymphs Abs: 1.7 10*3/uL (ref 0.7–4.0)
MCH: 28.1 pg (ref 26.0–34.0)
MCHC: 32 g/dL (ref 30.0–36.0)
MCV: 87.9 fL (ref 80.0–100.0)
Monocytes Absolute: 1.1 10*3/uL — ABNORMAL HIGH (ref 0.1–1.0)
Monocytes Relative: 9 %
Neutro Abs: 8.5 10*3/uL — ABNORMAL HIGH (ref 1.7–7.7)
Neutrophils Relative %: 70 %
Platelets: 56 10*3/uL — ABNORMAL LOW (ref 150–400)
RBC: 5.3 MIL/uL — ABNORMAL HIGH (ref 3.87–5.11)
RDW: 15.9 % — ABNORMAL HIGH (ref 11.5–15.5)
WBC: 12.3 10*3/uL — ABNORMAL HIGH (ref 4.0–10.5)
nRBC: 0 % (ref 0.0–0.2)

## 2018-10-05 LAB — MAGNESIUM: Magnesium: 2.7 mg/dL — ABNORMAL HIGH (ref 1.7–2.4)

## 2018-10-05 MED ORDER — METHOCARBAMOL 500 MG PO TABS
1000.0000 mg | ORAL_TABLET | Freq: Four times a day (QID) | ORAL | Status: DC | PRN
  Administered 2018-10-05 – 2018-10-07 (×5): 1000 mg via ORAL
  Filled 2018-10-05 (×5): qty 2

## 2018-10-05 NOTE — Progress Notes (Signed)
SATURATION QUALIFICATIONS:   Patient Saturations on Room Air at Rest = 95%  Patient Saturations on Room Air while Ambulating = 85%  Patient Saturations on 2 Liters of oxygen while Ambulating = 92%

## 2018-10-05 NOTE — Care Management Important Message (Signed)
Important Message  Patient Details  Name: [name] MRN: [id number] Date of Birth: 04-25-1951   Medicare Important Message Given:  Yes    Barb Merino Joetta Delprado 10/05/2018, 1:18 PM

## 2018-10-05 NOTE — Progress Notes (Signed)
Subjective: [name] still complains of nausea, back pain, and leg pain. She states that her legs are itching and mildly painful but thinks that it might be due to the [employer]. She also confirms that decreasing her muscle relaxer has not helped with decreasing her nausea and has lead to an increase in her back spasms. Has not had a good bowel movement in a few days which she notes could also be affecting her nausea.   Objective:  Vital signs in last 24 hours: Vitals:   10/04/18 1702 10/04/18 2056 10/05/18 0330 10/05/18 0333  BP: (!) 92/43 (!) 98/52  (!) 107/51  Pulse: 67 60  68  Resp:  20    Temp:  98.6 F (37 C)  98.1 F (36.7 C)  TempSrc:  Oral  Oral  SpO2:  92%  93%  Weight:   100.6 kg   Height:       General: Morbidly obese female sitting up in chair in no acute distress. Cardiovascular: difficult to appreciate heart sounds due to large body habitus. Resp: Normal WOB MSK: Unna boots on, 1+ BLE edema above Unna boots. Right Unna boot loose.   Assessment/Plan:  Principal Problem:   Acute diastolic congestive heart failure (HCC) Active Problems:   HTN (hypertension)   Idiopathic thrombocytopenic purpura (HCC)   Chronic back pain   Venous stasis dermatitis   Wound of lower extremity, bilateral   Cellulitis of lower extremity   BMI 45.0-49.9, adult (HCC)   Bilateral lower extremity edema   AKI (acute kidney injury) ([place])   Obesity hypoventilation syndrome ([place])  [name] hasHFpEF, CKD,HTN, and chronic back pain who was admitted for treatment of lower extremity cellulitis and acute on chronic heart failure exacerbation.   # Acute on chronicdiastoliccongestive heart failure exacerbation:  I/O: -520 today. Cr decreased to 1.02 from 1.31 yesterday with IVFs. Will continue holding her home lasix today.  - Hold Lasix today -Continue home amlodipine 5 mg daily, carvedilol 6.25 mg twice daily, lisinopril 2.5 mg daily -Strict I's and O's -Daily  weights -Ambulate with pulse ox  # Moderate RV failure secondary to Obesity Hypoventilation Syndrome: Echo showed RV systolic dysfunction and bedside U/S showed enlargement of right ventricle. Most likely due to untreated OHS. Residual edema is most likely due to vasoconstriction in lungs 2/2 to OHS leading to pulmonary HTN and eventually RV failure with back up of fluid to extremities. At this point, PFTs and outpatient sleep study will help further identify the cause of her RV failure. Tonight we plan to trial BiPaP with continuous pulse ox to verify if ventilatory support helps to decrease hypoxia. If BiPap is tolerated well tonight then will plan to set her up with a short term Bipap machine until we can conduct formal testing -bipap Inspiratory pressure 8 mmHg and expiratory 4 mmHg -continous pulse ox  # Chronic LE wounds 2/2 venous stasis dermatitis complicated by RLE non-purulent cellulitis: Bilateral Unna boots on but right leg looser than when previously placed. Will plan for replacement of Unna Boots bilaterally . Will finish Keflex today (day 7/7). - One more dose of Keflex tonight and then will discontinue. - Continue Unna Boots  # Hypertension: Blood pressure soft today but have increased from yesterday after fluid bolus. Will cont to hold lasix today.  -Continue amlodipine, carvedilol, lisinopril. - holding lasix today.  # ITP: Stable at 56,000 today.  # Chronic Back Pain 2/2 compression fracture s/p repair in 2015:  - Continue acetaminophen and  home Percocet - will increase methocarbamolto 1000 mg every 6 hours PRN muscle spasms  # Opioid-induced Constipation: Has not had a BM in several days. Will give a Bisacodyl suppository today. - Continue Miralax - Continue Bisacodyl 10 mg suppository daily PRN  - Continue senokot 2 tabs BID - Continue Phenergan 12.5 mg IV for nausea.   Dispo: Anticipated discharge in approximately 1-2 days.  Synetta Shadow,  Medical Student 10/05/2018, 7:04 AM Pager: [phone]  Attestation for Student Documentation:  I personally was present and performed or re-performed the history, physical exam and medical decision-making activities of this service and have verified that the service and findings are accurately documented in the student's note.  Carroll Sage, MD 10/05/2018, 11:02 AM

## 2018-10-06 DIAGNOSIS — J9612 Chronic respiratory failure with hypercapnia: Secondary | ICD-10-CM

## 2018-10-06 DIAGNOSIS — J9611 Chronic respiratory failure with hypoxia: Secondary | ICD-10-CM | POA: Diagnosis present

## 2018-10-06 LAB — BASIC METABOLIC PANEL
Anion gap: 9 (ref 5–15)
BUN: 26 mg/dL — ABNORMAL HIGH (ref 8–23)
CO2: 30 mmol/L (ref 22–32)
Calcium: 8.9 mg/dL (ref 8.9–10.3)
Chloride: 92 mmol/L — ABNORMAL LOW (ref 98–111)
Creatinine, Ser: 0.67 mg/dL (ref 0.44–1.00)
GFR calc Af Amer: >60 mL/min (ref >60)
GFR calc non Af Amer: >60 mL/min (ref >60)
Glucose, Bld: 94 mg/dL (ref 70–99)
Potassium: 4.3 mmol/L (ref 3.5–5.1)
Sodium: 131 mmol/L — ABNORMAL LOW (ref 135–145)

## 2018-10-06 LAB — MAGNESIUM: Magnesium: 2.4 mg/dL (ref 1.7–2.4)

## 2018-10-06 MED ORDER — BISACODYL 10 MG RE SUPP
10.0000 mg | Freq: Once | RECTAL | Status: AC
  Administered 2018-10-06: 10 mg via RECTAL
  Filled 2018-10-06: qty 1

## 2018-10-06 MED ORDER — ONDANSETRON HCL 4 MG/2ML IJ SOLN
4.0000 mg | Freq: Four times a day (QID) | INTRAMUSCULAR | Status: DC | PRN
  Administered 2018-10-06 – 2018-10-07 (×3): 4 mg via INTRAVENOUS
  Filled 2018-10-06 (×4): qty 2

## 2018-10-06 NOTE — Progress Notes (Signed)
Pt have order to initiate Bipap tonight, Informed Dr. Donne Hazel that floor policy will not allow for initiation of Bipap, Initially Dr. Donne Hazel placed an order for this patient to be transferred to Progressive floor but later discontinued the order, pt will be placed on continuous pulse ox tonight.

## 2018-10-06 NOTE — Progress Notes (Signed)
Called RT- asked them to let on-coming RT know to place patient on BiPAP tonight.  See PCP note

## 2018-10-06 NOTE — Progress Notes (Signed)
Occupational Therapy Treatment and Discharge Patient Details Name: [name] MRN: [id number] DOB: 01/09/1951 Today's Date: 10/06/2018    History of present illness Pt is a 68 y.o. woman admitted 09/29/18 with CHF exacerbation. PMH includes CKD, HTN, depression, compression fx, chronic back pain, obesity.   OT comments  Pt is functioning independently in ADL and mobility and knowledgeable in energy conservation strategies and use of AE, DME. Pt ambulated in halls with youth walker, initiating rest breaks independently. No further OT needs.  Follow Up Recommendations  No OT follow up    Equipment Recommendations  (Youth RW)    Recommendations for Other Services      Precautions / Restrictions Precautions Precautions: None Precaution Comments: H/o compression fxs; chronic back pain       Mobility Bed Mobility               General bed mobility comments: Received sitting in recliner  Transfers Overall transfer level: Independent Equipment used: Rolling walker (2 wheeled)                  Balance Overall balance assessment: Needs assistance   Sitting balance-Leahy Scale: Good       Standing balance-Leahy Scale: Good                             ADL either performed or assessed with clinical judgement   ADL Overall ADL's : Modified independent                         Toilet Transfer: Modified Independent;Ambulation;Regular Toilet   Toileting- Clothing Manipulation and Hygiene: Modified independent;Sit to/from stand       Functional mobility during ADLs: Modified independent;Rolling walker(in halls) General ADL Comments: Pt has been toileting independently and walking about her room without AD. Used RW in hall for energy conservation and comfort due to back pain.     Vision       Perception     Praxis      Cognition Arousal/Alertness: Awake/alert Behavior During Therapy: WFL for tasks assessed/performed Overall  Cognitive Status: Within Functional Limits for tasks assessed                                 General Comments: pt smiling and joking during session        Exercises     Shoulder Instructions       General Comments      Pertinent Vitals/ Pain       Pain Assessment: Faces Faces Pain Scale: Hurts a little bit Pain Location: back Pain Descriptors / Indicators: Constant;Discomfort Pain Intervention(s): Premedicated before session  Home Living                                          Prior Functioning/Environment              Frequency           Progress Toward Goals  OT Goals(current goals can now be found in the care plan section)  Progress towards OT goals: Goals met/education completed, patient discharged from OT  Acute Rehab OT Goals Patient Stated Goal: Return home  Plan All goals met and education completed, patient discharged from OT services  Co-evaluation                 AM-PAC OT "6 Clicks" Daily Activity     Outcome Measure   Help from another person eating meals?: None Help from another person taking care of personal grooming?: None Help from another person toileting, which includes using toliet, bedpan, or urinal?: None Help from another person bathing (including washing, rinsing, drying)?: None Help from another person to put on and taking off regular upper body clothing?: None Help from another person to put on and taking off regular lower body clothing?: None 6 Click Score: 24    End of Session Equipment Utilized During Treatment: Rolling walker  OT Visit Diagnosis: Pain;Other abnormalities of gait and mobility (R26.89)   Activity Tolerance Patient tolerated treatment well   Patient Left in chair;with call bell/phone within reach   Nurse Communication          Time: 3491-7915 OT Time Calculation (min): 27 min  Charges: OT Treatments $Self Care/Home Management : 23-37 mins  Nestor Lewandowsky, OTR/L Acute Rehabilitation Services Pager: [phone] Office: [phone]  Malka So 10/06/2018, 3:45 PM

## 2018-10-06 NOTE — Progress Notes (Signed)
Orthopedic Tech Progress Note Patient Details:  RAYLEI LOSURDO 1951-06-16 [id number]  Ortho Devices Type of Ortho Device: Haematologist Ortho Device/Splint Interventions: Adjustment, Ordered, Application   Post Interventions Patient Tolerated: Well Instructions Provided: Care of device, Adjustment of device   Keierra Nudo J Susy Placzek 10/06/2018, 11:39 AM

## 2018-10-06 NOTE — Progress Notes (Signed)
Subjective: [name] still complains of nausea and feeling weak. She has not had a bowel movement since Saturday and has also not been eating like normal so thinks this may be causing some of her nausea. She states that her legs feel better but are just really itchy.   Objective:  Vital signs in last 24 hours: Vitals:   10/05/18 1518 10/05/18 1945 10/06/18 0520 10/06/18 0850  BP: 114/62 (!) 104/59 (!) 112/54 116/65  Pulse: 73 69 67 72  Resp:  20 18   Temp: 98.9 F (37.2 C) 98.6 F (37 C) 98.6 F (37 C)   TempSrc: Oral Oral Oral   SpO2: 94% 95% 92% 96%  Weight:   99.5 kg   Height:       General: Morbidly obese female sitting up in chair in no acute distress. Cardiovascular: RRR, no m/r/g Resp: normal WOB and CTAB MSK: BLE appear swollen but decreased in size from admission, hemosiderin changes remain with overlying scabs, no purulent drainage or weeping of right LE, Feet remain swollen. Overall legs with 1-2+ pitting edema bilaterally  Assessment/Plan:  Principal Problem:   Acute diastolic congestive heart failure (HCC) Active Problems:   HTN (hypertension)   Idiopathic thrombocytopenic purpura (HCC)   Chronic back pain   Venous stasis dermatitis   Wound of lower extremity, bilateral   Cellulitis of lower extremity   BMI 45.0-49.9, adult (HCC)   Bilateral lower extremity edema   AKI (acute kidney injury) ([place])   Obesity hypoventilation syndrome (HCC)   Polycythemia, secondary  [name] hasHFpEF, CKD,HTN, and chronic back pain who was admitted for treatment of lower extremity cellulitis and acute on chronic heart failure exacerbation.   # Acute on chronicdiastoliccongestive heart failure exacerbation:  Cr decreased from 1.02 to 0.67 today. Will restart her home lasix today to keep her volume status maximized.  - Restart oral lasix 80 mg BID -Continue home amlodipine 5 mg daily, carvedilol 6.25 mg twice daily, lisinopril 2.5 mg daily -Strict I's and  O's -Daily weights -Ambulated with pulse ox yesterday and O2sats decreased to 85%, will plan to send her home with oxygen for ambulation only.  # Moderate RV failure secondary to Obesity Hypoventilation Syndrome: She did not receive BiPap last night and remains on oxygen today when we saw her. We will plan for Bipap tonight. If bipap improves hypoxia then we will set her up with bipap at home until we can formally get a sleep study and PFTs. We also stopped her supplemental O2 at rest today. We will continue to monitor her O2 sats.  -Bipap tonight Inspiratory pressure 8 mmhg and expiratory 4 mmHg -Continuous pulse oximetry -d/c oxygen at rest -EPO lab pending  Secondary Polycythemia: Likely due to underlying obesity hypoventilation syndrome - EPO lab pending.   # Chronic LE wounds 2/2 venous stasis dermatitis complicated by RLE non-purulent cellulitis: Bilateral Unna boots on but right leg looser than when previously placed. Will plan for replacement of Unna Boots bilaterally today. Keflex 7 day course finished. - Change Unna Boots today  # Hypertension: Blood pressure WNL. - Continue home carvedilol and lisinopril - Restart lasix per above  # ITP: Stable.  # Chronic Back Pain 2/2 compression fracture s/p repair in 2015:  - Continue acetaminophen and home Percocet - Cont methocarbamol1000 mg every 6 hours PRN muscle spasms  # Opioid-induced Constipation: Has not had a BM in several days. Will give a Bisacodyl suppository today. - Continue Miralax - Continue Bisacodyl 10 mg  suppository daily PRN  - Continue senokot 2 tabs BID - Continue Phenergan 12.5 mg IV for nausea.   Dispo: Anticipated discharge in approximately 1-2 days.  [name], Medical Student 10/06/2018, 8:23 AM Pager: [phone]  Attestation for Student Documentation:  I personally was present and performed or re-performed the history, physical exam and medical decision-making activities  of this service and have verified that the service and findings are accurately documented in the student's note.  Carroll Sage, MD 10/06/2018, 12:19 PM

## 2018-10-07 ENCOUNTER — Telehealth: Payer: Self-pay | Admitting: *Deleted

## 2018-10-07 ENCOUNTER — Other Ambulatory Visit (HOSPITAL_COMMUNITY): Payer: Self-pay | Admitting: Internal Medicine

## 2018-10-07 DIAGNOSIS — Z9981 Dependence on supplemental oxygen: Secondary | ICD-10-CM

## 2018-10-07 DIAGNOSIS — J9621 Acute and chronic respiratory failure with hypoxia: Secondary | ICD-10-CM

## 2018-10-07 DIAGNOSIS — Z6841 Body Mass Index (BMI) 40.0 and over, adult: Secondary | ICD-10-CM

## 2018-10-07 LAB — BASIC METABOLIC PANEL
Anion gap: 8 (ref 5–15)
BUN: 20 mg/dL (ref 8–23)
CO2: 31 mmol/L (ref 22–32)
Calcium: 9 mg/dL (ref 8.9–10.3)
Chloride: 93 mmol/L — ABNORMAL LOW (ref 98–111)
Creatinine, Ser: 0.6 mg/dL (ref 0.44–1.00)
GFR calc Af Amer: >60 mL/min (ref >60)
GFR calc non Af Amer: >60 mL/min (ref >60)
Glucose, Bld: 107 mg/dL — ABNORMAL HIGH (ref 70–99)
Potassium: 4.6 mmol/L (ref 3.5–5.1)
Sodium: 132 mmol/L — ABNORMAL LOW (ref 135–145)

## 2018-10-07 LAB — ERYTHROPOIETIN: Erythropoietin: 4.6 m[IU]/mL (ref 2.6–18.5)

## 2018-10-07 LAB — MAGNESIUM: Magnesium: 2.2 mg/dL (ref 1.7–2.4)

## 2018-10-07 MED ORDER — FUROSEMIDE 80 MG PO TABS: 80.0000 mg | ORAL_TABLET | Freq: Two times a day (BID) | ORAL | 0 refills | Status: DC

## 2018-10-07 MED ORDER — METHOCARBAMOL 500 MG PO TABS: 1000.0000 mg | ORAL_TABLET | Freq: Four times a day (QID) | ORAL | 0 refills | Status: DC | PRN

## 2018-10-07 MED ORDER — FUROSEMIDE 80 MG PO TABS
80.0000 mg | ORAL_TABLET | Freq: Two times a day (BID) | ORAL | Status: DC
  Administered 2018-10-07: 80 mg via ORAL
  Filled 2018-10-07: qty 1

## 2018-10-07 NOTE — Progress Notes (Signed)
Subjective: Overnight, [name] did not get her Bipap. Her floor was not capable of starting bipap so the order was placed to move her rooms but she refused to move because she did not want to incur more costs. This morning she states that she is ready to go home today. She was concerned about not being able to shower with the Unna boots and changing them as well. She still complains of nausea but medicine seems to help.   Objective:  Vital signs in last 24 hours: Vitals:   10/07/18 0814  BP: (!) 113/45  Pulse: 72  Resp: 18  Temp: 98.7 F (37.1 C)  TempSrc: Oral  SpO2: 94%  Weight:   Height:    General: Morbidly obese female sitting up in chair in no acute distress. Cardiovascular: RRR, no m/r/g Resp: normal WOB and CTAB when auscultated anteriorly MSK: Unna boots replaced today, swelling still present on feet, mild TTP bilaterally  Assessment/Plan:  Principal Problem:   Acute diastolic congestive heart failure (HCC) Active Problems:   HTN (hypertension)   Idiopathic thrombocytopenic purpura (HCC)   Chronic back pain   Venous stasis dermatitis   Wound of lower extremity, bilateral   Cellulitis of lower extremity   BMI 45.0-49.9, adult (HCC)   Bilateral lower extremity edema   AKI (acute kidney injury) ([place])   Obesity hypoventilation syndrome (HCC)   Polycythemia, secondary   Chronic respiratory failure with hypoxia and hypercapnia ([place])  Ms. Antunes hasHFpEF, CKD,HTN, and chronic back pain who was admitted for treatment of lower extremity cellulitis and acute on chronic heart failure exacerbation.   # Acute on Chronic Hypoxic Respiratory Failure secondary to Obesity Hypoventilation Syndrome: Bipap was unable to be started on her floor last night. See progress notes for further details. We will plan to continue the OHS workup outpatient with her PCP. With her hypoventilation, the goal O2 saturation is between 88-94% to avoid CO2 retention. Continuous O2 saturations  while in the room remained above 89% at rest when the oxygen was turned off therefore she does not need oxygen at rest. This information was relayed to her prior to discharge. She is medically stable for discharge home today. - outpatient PCP follow up scheduled, will need sleep study and PFTs   - continue supplemental oxygen with ambulation   # Acute on chronicdiastoliccongestive heart failure:  Cr 0.60 today. She does have some LE edema bilaterally but I forsee this continuing to improve with home Lasix treatment and Unna Boots.  - Restart Oral Lasix 80 mg BID -Continue home amlodipine 5 mg daily, carvedilol 6.25 mg twice daily, lisinopril 2.5 mg daily  # Secondary Polycythemia: Likely due to underlying obesity hypoventilation syndrome. EPO level 4.6.   # Chronic LE wounds 2/2 venous stasis dermatitis complicated by RLE non-purulent cellulitis: Bilateral Unna boots placed yesterday. Will stay on for a week. Will discharge her home with a home health nurse to help change Unna boots until wounds heal. With plans to switch to compression stockings.  # Hypertension: Blood pressure WNL. We will continue her previously prescribed home medications for BP managment - Continue carvedilol, lisinopril, and Lasix  # ITP: Stable.  # Chronic Back Pain 2/2 compression fracture s/p repair in 2015:  - Continue acetaminophen and home Percocet - Cont methocarbamol1000 mg every 6 hours PRN muscle spasms  # Opioid-induced Constipation: still nauseous but change to Zofran seems to have helped. - Continue Miralax - Continue Bisacodyl 10 mg suppository daily PRN  - Continue  senokot 2 tabs BID  Dispo: Discharged home today with Home health.  Synetta Shadow, Medical Student 10/07/2018, 8:47 AM Pager: [phone]  Attestation for Student Documentation:  I personally was present and performed or re-performed the history, physical exam and medical decision-making activities of this service  and have verified that the service and findings are accurately documented in the student's note.  Carroll Sage, MD 10/07/2018, 9:50 AM

## 2018-10-07 NOTE — Progress Notes (Addendum)
Pt discharge instructions reviewed with pt. Pt verbalizes understanding and states she has no questions. Pt belongings with pt. Pt is not in distress. Pt home oxygen and walker at bedside. Pt's son-in-law is driving her home. Pt discharged via wheelchair.

## 2018-10-07 NOTE — Care Management Note (Addendum)
Case Management Note  Patient Details  Name: [name] MRN: [id number] Date of Birth: 11-05-1950  Subjective/Objective:        CHF           Action/Plan: CM talked to patient at the bedside for DCP; Home oxygen arranged with [facility]; CM talked to [facility], he will deliver a portable oxygen tank to the room prior to discharging home; rolling walker order as requested. [facility] is to [facility] with [facility] after discharge home. HHC choice offered, pt chose [facility]; [place] with [facility] called for arrangements. Expected Discharge Date:  10/07/2018              Expected Discharge Plan:  [place]  Discharge planning Services  CM Consult  Choice offered to:  Patient  DME Arranged:  Gilford Rile, Oxygen DME Agency:  [place]:  RN, Nurse's Aide [place] Agency:  [facility] ([facility]  Status of Service:  In process, will continue to follow  Sherrilyn Rist [phone] 10/07/2018, 11:25 AM

## 2018-10-07 NOTE — Progress Notes (Signed)
SATURATION QUALIFICATIONS: (This note is used to comply with regulatory documentation for home oxygen)  Patient Saturations on Room Air at Rest = 83%  Patient Saturations on Room Air while Ambulating = 79%  Patient Saturations on 2 Liters of oxygen while Ambulating = 88%  Please briefly explain why patient needs home oxygen: pt desats without oxygen

## 2018-10-07 NOTE — Plan of Care (Signed)

## 2018-10-07 NOTE — Telephone Encounter (Signed)
01/29 215pm pt appt, hospital f/u per alexi med student/NW

## 2018-10-07 NOTE — Progress Notes (Signed)
RN rounded on pt. Pt is asleep. 

## 2018-10-07 NOTE — Progress Notes (Deleted)
Patient placed himself on home BIPAP unit for the night.

## 2018-10-07 NOTE — Progress Notes (Signed)
Patient refused Bipap for the night.   

## 2018-10-12 ENCOUNTER — Telehealth: Payer: Self-pay | Admitting: *Deleted

## 2018-10-12 ENCOUNTER — Other Ambulatory Visit: Payer: Self-pay | Admitting: Internal Medicine

## 2018-10-12 ENCOUNTER — Other Ambulatory Visit: Payer: Self-pay

## 2018-10-12 DIAGNOSIS — R110 Nausea: Secondary | ICD-10-CM

## 2018-10-12 MED ORDER — PROMETHAZINE HCL 12.5 MG PO TABS: 12.5000 mg | ORAL_TABLET | Freq: Four times a day (QID) | ORAL | 0 refills | Status: DC | PRN

## 2018-10-12 NOTE — Telephone Encounter (Signed)
rtc to pt, gave her message from dr Alfonse Spruce. She verbalized agreement and understanding. States she will keep appt

## 2018-10-12 NOTE — Telephone Encounter (Signed)
Pt calls for something for nauseous. She states it is hard to eat but she did eat some spaghetti last pm. She stated the nausea started again this am. She has been this way since leaving hospital. She states she does not do suppositories because her back pain prevents her from reaching that way. Please advise

## 2018-10-12 NOTE — Telephone Encounter (Signed)
Thank you Bonnita Nasuti for this information. I have sent in a precription for phenergan 12.5 mg q6h PRN #30. Please let Ms. Henery know that I have done this. She has a hospital follow-up visit with our clinic on 10/20/18. Please let her that she will need to be seen in the clinic earlier if she has: intractable vomiting, inability to take anything by mouth, or persistent nausea not relieved by phenergan.

## 2018-10-12 NOTE — Telephone Encounter (Signed)
Requesting to speak with a nurse about getting nausea med. Please call pt back.

## 2018-10-12 NOTE — Telephone Encounter (Signed)
Pt needs pcp, has not seen [facility] since 2014. [facility] calls and ask [employer] to take responsibility for orders. [facility] will be [facility] for h[facility], at that time could we offer pcp services to pt? Pt will have ongoing [facility] services and needs someone to sign orders each time

## 2018-10-13 ENCOUNTER — Ambulatory Visit: Payer: Medicare Other | Admitting: Family Medicine

## 2018-10-13 ENCOUNTER — Ambulatory Visit: Payer: Medicare Other

## 2018-10-20 ENCOUNTER — Ambulatory Visit (INDEPENDENT_AMBULATORY_CARE_PROVIDER_SITE_OTHER): Payer: Medicare Other | Admitting: Internal Medicine

## 2018-10-20 ENCOUNTER — Other Ambulatory Visit: Payer: Self-pay

## 2018-10-20 ENCOUNTER — Encounter: Payer: Self-pay | Admitting: Internal Medicine

## 2018-10-20 VITALS — BP 142/92 | HR 63 | Temp 98.1°F | Ht 65.0 in | Wt 203.0 lb

## 2018-10-20 DIAGNOSIS — D693 Immune thrombocytopenic purpura: Secondary | ICD-10-CM

## 2018-10-20 DIAGNOSIS — Z79899 Other long term (current) drug therapy: Secondary | ICD-10-CM

## 2018-10-20 DIAGNOSIS — M546 Pain in thoracic spine: Secondary | ICD-10-CM

## 2018-10-20 DIAGNOSIS — Z87891 Personal history of nicotine dependence: Secondary | ICD-10-CM

## 2018-10-20 DIAGNOSIS — S81802D Unspecified open wound, left lower leg, subsequent encounter: Secondary | ICD-10-CM | POA: Diagnosis not present

## 2018-10-20 DIAGNOSIS — G8929 Other chronic pain: Secondary | ICD-10-CM

## 2018-10-20 DIAGNOSIS — Z0000 Encounter for general adult medical examination without abnormal findings: Secondary | ICD-10-CM

## 2018-10-20 DIAGNOSIS — I872 Venous insufficiency (chronic) (peripheral): Secondary | ICD-10-CM | POA: Diagnosis not present

## 2018-10-20 DIAGNOSIS — S81801D Unspecified open wound, right lower leg, subsequent encounter: Secondary | ICD-10-CM

## 2018-10-20 DIAGNOSIS — J9612 Chronic respiratory failure with hypercapnia: Secondary | ICD-10-CM

## 2018-10-20 DIAGNOSIS — I10 Essential (primary) hypertension: Secondary | ICD-10-CM

## 2018-10-20 DIAGNOSIS — J9611 Chronic respiratory failure with hypoxia: Secondary | ICD-10-CM | POA: Diagnosis not present

## 2018-10-20 DIAGNOSIS — K5903 Drug induced constipation: Secondary | ICD-10-CM

## 2018-10-20 DIAGNOSIS — Z9981 Dependence on supplemental oxygen: Secondary | ICD-10-CM

## 2018-10-20 DIAGNOSIS — I5032 Chronic diastolic (congestive) heart failure: Secondary | ICD-10-CM | POA: Diagnosis not present

## 2018-10-20 DIAGNOSIS — S81809D Unspecified open wound, unspecified lower leg, subsequent encounter: Secondary | ICD-10-CM

## 2018-10-20 MED ORDER — SENNOSIDES-DOCUSATE SODIUM 8.6-50 MG PO TABS: 2.0000 | ORAL_TABLET | Freq: Two times a day (BID) | ORAL | 3 refills | Status: DC

## 2018-10-20 NOTE — Assessment & Plan Note (Signed)
Blood pressure is at goal on amlodipine 5 mg, Coreg 6.25 mg twice daily, lisinopril 2.5 mg daily, and Lasix 80 daily.  We will continue current regimen.  If issues in the future would recommend increasing either Coreg or lisinopril.  Her heart rate can tolerate increase in Coreg if needed and she has normal renal function at baseline.  I would avoid increasing amlodipine with history of chronic lower extremity edema.

## 2018-10-20 NOTE — Patient Instructions (Addendum)
Ms. Friese,   For your lower extremity wounds, you have an appointment tomorrow to remove the Unna boots and place new ones.  You should receive a call from the nurse to let you know what time your appointment is.  For your heart failure continue taking 1 tablet of Lasix every day.  Please make sure to weigh yourself every day and call us if your weight goes up by 3 pounds in 1 week or 5 pounds in 1 day.  For your low oxygen levels we need to check of your lungs are doing.  For this you will need a sleep study and pulmonary function test.  I have ordered both of these for you the hospital will call you to set up an appointment for these 2 tests.  I also sent a prescription to your pharmacy for Senokot.  This is a laxative you can take as needed when you are constipated.  You can take 2 tablets twice a day every day anterior bowel movements regulate.  Please make a follow-up appointment in 1 month to check up with you again.  In the meantime call us if you have any questions or concerns.  -Dr. Frederico Hamman

## 2018-10-20 NOTE — Assessment & Plan Note (Signed)
Patient has a history of steroid refractory ITP.  She used to follow-up with Dr. Alvy Bimler at the cancer center but has not been seen since 2015.  Per Dr. Alvy Bimler note from 07/2014 patient did not respond to rituximab.  She was placed on Nplate with good response but injections were discontinued, unclear why. Dr. Alvy Bimler stated in her know that patient will likely have chronic thrombocytopenia, and that as well as her platelet count is greater than 50,000 she would not require any further treatment.  Patient denies vaginal bleeding, hematuria, hematochezia, melena, gum bleeding, and easy bruising.  We will continue to monitor.

## 2018-10-20 NOTE — Progress Notes (Signed)
CC: Follow up for chronic LE wounds and to establish Care   HPI:  [name] is a 68 y.o. year-old female with PMH listed below who presents to clinic for follow-up of chronic liver extremity wounds and to establish care. Please see problem based assessment and plan for further details.   Past Medical History:  Diagnosis Date  . Arthritis    lumbar burst fx, osteoporosis  . Back pain 09/29/2013  . Blood dyscrasia   . Chest pain    denies  . Chronic kidney disease    passed stones spontaneously- 40 yrs. ago   . Depression    states her current situation with activity limitations, she has a low spirit at times.   Marland Kitchen Dysrhythmia    occ tachycardia  . Fracture of vertebra, compression ([place])   . GERD (gastroesophageal reflux disease)    occ  . History of kidney stones   . HTN (hypertension)   . ITP (idiopathic thrombocytopenic purpura) 08/29/2013  . Leukocytosis, unspecified 09/05/2013  . Muscle cramp 11/16/2013  . Tachycardia    occ upon exersion, and when put on bp med   Past Surgical History:  Procedure Laterality Date  . ABDOMINAL HYSTERECTOMY    . ANTERIOR LAT LUMBAR FUSION N/A 03/28/2014   Procedure: Anterolateral decompression of L1 fracture with posterior segmental stabilization;  Surgeon: Kristeen Miss, MD;  Location: [facility];  Service: Neurosurgery;  Laterality: N/A;  Anterolateral decompression of Lumbar One fracture with posterior segmental stabilization  . CESAREAN SECTION     x2  . CHEST TUBE INSERTION Left 03/28/2014   Procedure: CHEST TUBE INSERTION;  Surgeon: Ivin Poot, MD;  Location: [facility];  Service: Thoracic;  Laterality: Left;  . INCISIONAL HERNIA REPAIR N/A 11/13/2014   Procedure: PRIMARY REPAIR INCARCERATED INCISIONAL HERNIA WITH PLACEMENT BIOLOGIC MESH;  Surgeon: Rolm Bookbinder, MD;  Location: [facility];  Service: General;  Laterality: N/A;  . LAPAROTOMY Right 09   ovarian cyst  . TOTAL ABDOMINAL HYSTERECTOMY W/ BILATERAL  SALPINGOOPHORECTOMY  97   Allergies: None   Family History  Problem Relation Age of Onset  . Hypertension Other   . Heart attack Other   . Leukemia Paternal Uncle        leukemia   Social History: Ms. Quarry lives at home with her daughter.  She is able to perform ADLs by herself though with some difficulty due to body habitus.  She drives and is able to drive herself to her medical appointments.  She manages her medications.  She is a former smoker, quit in 2015.  She denies alcohol and drug use.  She is not sexually active.  She does not work.   Review of Systems:   Review of Systems  Constitutional: Negative for chills, fever, malaise/fatigue and weight loss.  Respiratory: Positive for shortness of breath.   Cardiovascular: Positive for orthopnea and leg swelling. Negative for chest pain.  Gastrointestinal: Positive for constipation. Negative for abdominal pain, nausea and vomiting.  Musculoskeletal: Positive for back pain, joint pain and myalgias.  Neurological: Negative for dizziness and headaches.    Physical Exam: Vitals:   10/20/18 1332  BP: 139/73  Pulse: 94  Temp: 98.2 F (36.8 C)  TempSrc: Oral  SpO2: 90%  Weight: 216 lb 3.2 oz (98.1 kg)  Height: 4\' 10"  (1.473 m)    General: Obese female, well-appearing, in no acute distress HENT: NCAT, neck supple and FROM, no LAD OP clear without exudates or erythema, poor  dentition  Eyes: anicteric sclera, PERRL Cardiac: regular rate and rhythm, nl S1/S2, no murmurs, rubs or gallops, no JVD  Pulm: distant breath sounds but CTAB, no wheezes or crackles, no increased work of breathing on room air  Abd: abdomen is obese but soft, NTND, with normoactive bowel sounds  Neuro: A&Ox3, CN II-XII intact, sensation intact in all four extremities, strength testing difficult and lower extremities due to pain but no focal deficits noted, upper extremity have normal strength Ext: warm and well perfused, difficult to assess for edema as she  has Unna boots on both legs  Derm: Bilateral legs wrapped with Unna boots, I do appreciate mild erythema over boots   Assessment & Plan:   See Encounters Tab for problem based charting.  Patient discussed with Dr. Lynnae January

## 2018-10-20 NOTE — Assessment & Plan Note (Addendum)
Patient has a 12-pack-year smoking history.  Quit smoking in in 2015.  Has not smoked a cigarette since then.  Denies vaping.  She meets criteria for lung cancer screening with low-dose CT as she only stopped smoking 3 years ago.  I did discuss this with patient during today's visit, but she became overwhelmed by the amount of work-up needed.  I therefore limited evaluation to a sleep study and PFTs.  We will address this at her next visit.

## 2018-10-20 NOTE — Assessment & Plan Note (Addendum)
Patient has a history of chronic thoracic and lumbar back pain after L1 traumatic fracture in 2015 that require surgery.  She is on chronic opiate therapy with Percocet 10- 325 mg 3 times daily as needed that is prescribed by her spinal surgeon Dr. Ellene Route.  She also takes Robaxin 1000 mg every 6 hours as needed for back muscle spasms.  She experiences flare in back pain at times, but is generally well controlled on this regimen.  She does experience constipation at times and does not have a laxative at home.  Will prescribe Senokot 2 tabs twice daily as needed for constipation.  She did respond well to this during her recent admission.  Database reviewed and appropriate.

## 2018-10-20 NOTE — Assessment & Plan Note (Addendum)
Ms. Hjort was recently admitted to the hospital 1/15-1/23 for chronic lower extremity wounds with superimposed cellulitis.  During her admission she was noted to be hypoxic at different times of day requiring supplemental O2. This was thought to be secondary pulmonary hypertension due to OSA vs OHS given her body habitus. She does have evidence of pHTN on echocardiogram as well as moderate RV failure. ABG obtained during the day while she was on room air showed CO2 retention and hypoxia with PO2 at 54 which supports this as well. We attempted to start BiPAP, but per nursing protocol they are not allowed to start and titrate BiPAP on a regular floor. Patient declined transfer to stepdown due to concern for cost burden. She was discharged on supplemental O2 after her home O2 eval showed O2 sat of 83% at rest and 79% on exertion.   She reports feeling better today.  She is not using any supplemental oxygen.  O2 tank was delivered to her house yesterday but is too big for her to carry around.  She does use it at home as needed.  She is oxygenating at 90% on room air today and I do not notice labored breathing.  She is able to speak in full sentences and her lungs sound clear to auscultation.  We will order a sleep study today to evaluate for sleep apnea as she is at high risk (STOP BANG 6) as well as PFTs.

## 2018-10-20 NOTE — Assessment & Plan Note (Signed)
Please see assessment and plan for chronic back pain for further details about management.

## 2018-10-20 NOTE — Assessment & Plan Note (Addendum)
Patient has a history of chronic diastolic heart failure and was treated for an acute heart failure exacerbation during her most recent admission 1/15-1/23.  Echocardiogram from 1/17 showed moderate LVH, EF of 60%, no wall motion abnormalities, G1DD, LAD, and mild-moderate RV dysfunction.  No valvular disease noted.  She diuresed well with IV Lasix and was transitioned to p.o. Lasix 80 mg twice daily. She did developed an AKI that resolved after holding diuresis for 2 days.  On day of discharge her weight was 219 pounds and she is at 216 pounds today.  She does report she has not been taking her Lasix as prescribed and is taking it once a day.  She has not noticed worsening edema on this dose.  She does continue to endorse orthopnea and is sleeping in a recliner.  I think this is related to her body habitus as she had no evidence of pulmonary edema on recent chest x-ray.  She has mild JVD, but otherwise appears euvolemic on exam though volume status difficult to assess due to body habitus.  We will continue therapy with a beta-blocker, ACE inhibitor, and loop diuretic (Lasix 80 mg QD).  Educated patient on importance of daily weights which she has not been doing. She does have a scale at home.  Advised her to take Lasix 80 twice daily if she notices a 3 pound weight gain in 1 week or 5 pound weight gain in 1 day.  She is also to call clinic if this occurs. With her moderate RV failure she is pre-load dependent and will have to be careful not to overdiurese her.   Will check BMP today.

## 2018-10-20 NOTE — Assessment & Plan Note (Signed)
Patient suffers from venous stasis dermatitis that has led to chronic lower extremity wounds bilaterally.  She was admitted to the hospital recently due to worsening swelling and pain on bilateral lower extremities with possible superimposed cellulitis.  She was initially treated with Ancef and was transitioned to Keflex.  She completed 5 days of antibiotic therapy.  Unna boots were placed during admission to help with healing and lower extremity swelling which is worsening this chronic issue.  Unfortunately home health RN was not able to see her last week and her Unna boots have not been changed since discharge.  We called her home health agency and she has an appointment for [employer] removal tomorrow with placement of new ones. Of note, we attempted to obtain ABIs during her recent admission but we were unable to due to her chronic wounds. She declined ABIs today as she could not stay longer. She will follow up with me in 4 weeks. If no Unna boots, will obtain ABIs then.

## 2018-10-20 NOTE — Assessment & Plan Note (Signed)
I did not discuss any preventive measures during today's visit other than the ones mentioned under the history of tobacco use assessment and plan.  Patient became overwhelmed by the amount of studies ordered during today's visit and we have limited evaluation to sleep study and PFTs.  This will need to be addressed in future visits.  She declined flu vaccine today.

## 2018-10-21 ENCOUNTER — Telehealth: Payer: Self-pay

## 2018-10-21 LAB — BMP8+ANION GAP
Anion Gap: 18 mmol/L (ref 10.0–18.0)
BUN/Creatinine Ratio: 37 — ABNORMAL HIGH (ref 12–28)
BUN: 20 mg/dL (ref 8–27)
CO2: 23 mmol/L (ref 20–29)
Calcium: 9.1 mg/dL (ref 8.7–10.3)
Chloride: 99 mmol/L (ref 96–106)
Creatinine, Ser: 0.54 mg/dL — ABNORMAL LOW (ref 0.57–1.00)
GFR calc Af Amer: 115 mL/min/{1.73_m2} (ref >59)
GFR calc non Af Amer: 99 mL/min/{1.73_m2} (ref >59)
Glucose: 125 mg/dL — ABNORMAL HIGH (ref 65–99)
Potassium: 3.6 mmol/L (ref 3.5–5.2)
Sodium: 140 mmol/L (ref 134–144)

## 2018-10-21 NOTE — Telephone Encounter (Signed)
Agree with VO for wound care. No antibiotics for now. Her legs are always red from venous stasis dermatitis and she just completed antibiotics 2 weeks ago.

## 2018-10-21 NOTE — Telephone Encounter (Signed)
Pt came for HFU visit yesterday; he saw Dr Isac Sarna.

## 2018-10-21 NOTE — Telephone Encounter (Signed)
[name] with [facility] requesting VO for wound care. Please call back.

## 2018-10-21 NOTE — Telephone Encounter (Signed)
April, HHN calls and states UNA boots changed today, will need to continue, 2 open areas on LLE and RLE has healing but oozy areas. She ask to change UNA boots 2x week and place zform on open areas. She states pt is not on abx therapy at this time but both LE are red, do you want to hold off on abx or start again? VO for wound care and visit 2x week for 9 weeks with UNA replaced 2x week for 9 weeks. Do you agree? April [phone]

## 2018-10-22 NOTE — Progress Notes (Signed)
Internal Medicine Clinic Attending  Case discussed with Dr. Santos at the time of the visit.  We reviewed the resident's history and exam and pertinent patient test results.  I agree with the assessment, diagnosis, and plan of care documented in the resident's note.    

## 2018-11-03 ENCOUNTER — Other Ambulatory Visit (HOSPITAL_COMMUNITY): Payer: Self-pay | Admitting: Internal Medicine

## 2018-11-05 ENCOUNTER — Other Ambulatory Visit (HOSPITAL_COMMUNITY): Payer: Self-pay | Admitting: Internal Medicine

## 2018-11-05 ENCOUNTER — Inpatient Hospital Stay (HOSPITAL_COMMUNITY): Admission: RE | Admit: 2018-11-05 | Payer: Medicare Other | Source: Ambulatory Visit

## 2018-11-11 ENCOUNTER — Telehealth: Payer: Self-pay | Admitting: *Deleted

## 2018-11-11 ENCOUNTER — Telehealth: Payer: Self-pay

## 2018-11-11 NOTE — Telephone Encounter (Signed)
Randy with [facility] requesting to speak with a nurse about pt using oxygen. Please call back.

## 2018-11-11 NOTE — Telephone Encounter (Signed)
VO given for O2, supplemental at home 2liters [place] as needed, do you agree?

## 2018-11-11 NOTE — Telephone Encounter (Signed)
Yes, only as needed. Thank you!

## 2018-11-26 ENCOUNTER — Encounter: Payer: Medicare Other | Admitting: Internal Medicine

## 2018-11-30 ENCOUNTER — Other Ambulatory Visit: Payer: Self-pay | Admitting: *Deleted

## 2018-11-30 MED ORDER — METHOCARBAMOL 500 MG PO TABS: ORAL_TABLET | ORAL | 0 refills | Status: DC

## 2018-12-08 ENCOUNTER — Encounter (HOSPITAL_BASED_OUTPATIENT_CLINIC_OR_DEPARTMENT_OTHER): Payer: Medicare Other

## 2018-12-29 ENCOUNTER — Other Ambulatory Visit: Payer: Self-pay | Admitting: *Deleted

## 2018-12-29 MED ORDER — METHOCARBAMOL 500 MG PO TABS: ORAL_TABLET | ORAL | 0 refills | Status: DC

## 2019-01-05 ENCOUNTER — Telehealth: Payer: Self-pay | Admitting: *Deleted

## 2019-01-05 NOTE — Telephone Encounter (Signed)
[facility] calls and states that pt called their office and requested visits start again for UNA boots. [facility] ended care 3/19, last eval was 3/16- "wounds healed, pt to obtain compression stockings and begin use, she stated she would go and purchase stockings and start using" Tim, [facility] case manager states pt must have a face to face and they can arrange a video visit on pt side for that visit, informed him [facility] is doing audio telehealth visits at this time and he states this would not be acceptable for insurance.pt told him she would not be willing to come to office due to [place] 19.  Attempted to call pt, ph rang until it cut off with the asking for an access #. [phone]

## 2019-01-17 ENCOUNTER — Other Ambulatory Visit: Payer: Self-pay | Admitting: Internal Medicine

## 2019-01-17 NOTE — Telephone Encounter (Signed)
Will defer to PCP to fill 

## 2019-01-21 ENCOUNTER — Other Ambulatory Visit: Payer: Self-pay

## 2019-01-21 ENCOUNTER — Encounter: Payer: Medicare Other | Admitting: Internal Medicine

## 2019-01-23 ENCOUNTER — Telehealth: Payer: Self-pay | Admitting: Internal Medicine

## 2019-01-23 NOTE — Telephone Encounter (Signed)
Called patient several times to her home phone for telehealth appointment. No answer. Left a voice message to return call.

## 2019-02-15 ENCOUNTER — Other Ambulatory Visit: Payer: Self-pay | Admitting: *Deleted

## 2019-02-17 MED ORDER — METHOCARBAMOL 500 MG PO TABS: ORAL_TABLET | ORAL | 1 refills | Status: DC

## 2019-02-25 ENCOUNTER — Other Ambulatory Visit: Payer: Self-pay

## 2019-02-25 ENCOUNTER — Emergency Department (HOSPITAL_COMMUNITY): Payer: Medicare Other

## 2019-02-25 ENCOUNTER — Encounter (HOSPITAL_COMMUNITY): Payer: Self-pay | Admitting: Emergency Medicine

## 2019-02-25 ENCOUNTER — Observation Stay (HOSPITAL_COMMUNITY)
Admission: EM | Admit: 2019-02-25 | Discharge: 2019-02-26 | Disposition: A | Discharge status: Home / Self Care | Payer: Medicare Other | Attending: Internal Medicine | Admitting: Internal Medicine

## 2019-02-25 DIAGNOSIS — Z1159 Encounter for screening for other viral diseases: Secondary | ICD-10-CM | POA: Diagnosis not present

## 2019-02-25 DIAGNOSIS — Z79899 Other long term (current) drug therapy: Secondary | ICD-10-CM | POA: Diagnosis not present

## 2019-02-25 DIAGNOSIS — I872 Venous insufficiency (chronic) (peripheral): Principal | ICD-10-CM | POA: Diagnosis present

## 2019-02-25 DIAGNOSIS — F329 Major depressive disorder, single episode, unspecified: Secondary | ICD-10-CM | POA: Diagnosis not present

## 2019-02-25 DIAGNOSIS — Z8249 Family history of ischemic heart disease and other diseases of the circulatory system: Secondary | ICD-10-CM | POA: Diagnosis not present

## 2019-02-25 DIAGNOSIS — I5032 Chronic diastolic (congestive) heart failure: Secondary | ICD-10-CM | POA: Diagnosis not present

## 2019-02-25 DIAGNOSIS — I130 Hypertensive heart and chronic kidney disease with heart failure and stage 1 through stage 4 chronic kidney disease, or unspecified chronic kidney disease: Secondary | ICD-10-CM | POA: Diagnosis not present

## 2019-02-25 DIAGNOSIS — Z6841 Body Mass Index (BMI) 40.0 and over, adult: Secondary | ICD-10-CM | POA: Insufficient documentation

## 2019-02-25 DIAGNOSIS — Z87891 Personal history of nicotine dependence: Secondary | ICD-10-CM | POA: Insufficient documentation

## 2019-02-25 DIAGNOSIS — L97929 Non-pressure chronic ulcer of unspecified part of left lower leg with unspecified severity: Secondary | ICD-10-CM | POA: Insufficient documentation

## 2019-02-25 DIAGNOSIS — M549 Dorsalgia, unspecified: Secondary | ICD-10-CM | POA: Diagnosis not present

## 2019-02-25 DIAGNOSIS — R0981 Nasal congestion: Secondary | ICD-10-CM | POA: Diagnosis not present

## 2019-02-25 DIAGNOSIS — G8929 Other chronic pain: Secondary | ICD-10-CM | POA: Insufficient documentation

## 2019-02-25 DIAGNOSIS — N189 Chronic kidney disease, unspecified: Secondary | ICD-10-CM | POA: Insufficient documentation

## 2019-02-25 DIAGNOSIS — D693 Immune thrombocytopenic purpura: Secondary | ICD-10-CM | POA: Diagnosis not present

## 2019-02-25 DIAGNOSIS — I10 Essential (primary) hypertension: Secondary | ICD-10-CM | POA: Diagnosis present

## 2019-02-25 DIAGNOSIS — M8088XA Other osteoporosis with current pathological fracture, vertebra(e), initial encounter for fracture: Secondary | ICD-10-CM | POA: Diagnosis not present

## 2019-02-25 DIAGNOSIS — L03115 Cellulitis of right lower limb: Secondary | ICD-10-CM

## 2019-02-25 DIAGNOSIS — L97919 Non-pressure chronic ulcer of unspecified part of right lower leg with unspecified severity: Secondary | ICD-10-CM | POA: Insufficient documentation

## 2019-02-25 LAB — CBC WITH DIFFERENTIAL/PLATELET
Abs Immature Granulocytes: 0.05 10*3/uL (ref 0.00–0.07)
Basophils Absolute: 0 10*3/uL (ref 0.0–0.1)
Basophils Relative: 0 %
Eosinophils Absolute: 0.5 10*3/uL (ref 0.0–0.5)
Eosinophils Relative: 4 %
HCT: 43.4 % (ref 36.0–46.0)
Hemoglobin: 13.6 g/dL (ref 12.0–15.0)
Immature Granulocytes: 1 %
Lymphocytes Relative: 18 %
Lymphs Abs: 1.8 10*3/uL (ref 0.7–4.0)
MCH: 28.2 pg (ref 26.0–34.0)
MCHC: 31.3 g/dL (ref 30.0–36.0)
MCV: 90 fL (ref 80.0–100.0)
Monocytes Absolute: 0.7 10*3/uL (ref 0.1–1.0)
Monocytes Relative: 6 %
Neutro Abs: 7.2 10*3/uL (ref 1.7–7.7)
Neutrophils Relative %: 71 %
Platelets: 56 10*3/uL — ABNORMAL LOW (ref 150–400)
RBC: 4.82 MIL/uL (ref 3.87–5.11)
RDW: 14.5 % (ref 11.5–15.5)
WBC: 10.3 10*3/uL (ref 4.0–10.5)
nRBC: 0 % (ref 0.0–0.2)

## 2019-02-25 LAB — HEPATIC FUNCTION PANEL
ALT: 30 U/L (ref 0–44)
AST: 22 U/L (ref 15–41)
Albumin: 4.1 g/dL (ref 3.5–5.0)
Alkaline Phosphatase: 82 U/L (ref 38–126)
Bilirubin, Direct: 0.2 mg/dL (ref 0.0–0.2)
Indirect Bilirubin: 0.5 mg/dL (ref 0.3–0.9)
Total Bilirubin: 0.7 mg/dL (ref 0.3–1.2)
Total Protein: 6.5 g/dL (ref 6.5–8.1)

## 2019-02-25 LAB — BASIC METABOLIC PANEL
Anion gap: 10 (ref 5–15)
BUN: 24 mg/dL — ABNORMAL HIGH (ref 8–23)
CO2: 31 mmol/L (ref 22–32)
Calcium: 9.3 mg/dL (ref 8.9–10.3)
Chloride: 97 mmol/L — ABNORMAL LOW (ref 98–111)
Creatinine, Ser: 0.61 mg/dL (ref 0.44–1.00)
GFR calc Af Amer: >60 mL/min (ref >60)
GFR calc non Af Amer: >60 mL/min (ref >60)
Glucose, Bld: 112 mg/dL — ABNORMAL HIGH (ref 70–99)
Potassium: 4.2 mmol/L (ref 3.5–5.1)
Sodium: 138 mmol/L (ref 135–145)

## 2019-02-25 LAB — PROTIME-INR
INR: 1 (ref 0.8–1.2)
Prothrombin Time: 13.1 seconds (ref 11.4–15.2)

## 2019-02-25 LAB — BRAIN NATRIURETIC PEPTIDE: B Natriuretic Peptide: 13.9 pg/mL (ref 0.0–100.0)

## 2019-02-25 MED ORDER — ONDANSETRON HCL 4 MG/2ML IJ SOLN: 4.0000 mg | Freq: Four times a day (QID) | INTRAMUSCULAR | Status: DC | PRN

## 2019-02-25 MED ORDER — RAMELTEON 8 MG PO TABS
8.0000 mg | ORAL_TABLET | Freq: Every day | ORAL | Status: DC
  Administered 2019-02-26: 8 mg via ORAL
  Filled 2019-02-25 (×2): qty 1

## 2019-02-25 MED ORDER — SENNOSIDES-DOCUSATE SODIUM 8.6-50 MG PO TABS
1.0000 | ORAL_TABLET | Freq: Two times a day (BID) | ORAL | Status: DC
  Administered 2019-02-26 (×2): 1 via ORAL
  Filled 2019-02-25 (×2): qty 1

## 2019-02-25 MED ORDER — OXYCODONE-ACETAMINOPHEN 10-325 MG PO TABS: 1.0000 | ORAL_TABLET | Freq: Three times a day (TID) | ORAL | Status: DC

## 2019-02-25 MED ORDER — POTASSIUM CHLORIDE CRYS ER 10 MEQ PO TBCR
10.0000 meq | EXTENDED_RELEASE_TABLET | Freq: Every day | ORAL | Status: DC
  Administered 2019-02-26: 10 meq via ORAL
  Filled 2019-02-25: qty 1

## 2019-02-25 MED ORDER — OXYCODONE-ACETAMINOPHEN 5-325 MG PO TABS
1.0000 | ORAL_TABLET | Freq: Three times a day (TID) | ORAL | Status: DC
  Administered 2019-02-26 (×3): 1 via ORAL
  Filled 2019-02-25 (×3): qty 1

## 2019-02-25 MED ORDER — LISINOPRIL 2.5 MG PO TABS
2.5000 mg | ORAL_TABLET | Freq: Every day | ORAL | Status: DC
  Administered 2019-02-26: 2.5 mg via ORAL
  Filled 2019-02-25: qty 1

## 2019-02-25 MED ORDER — CLINDAMYCIN PHOSPHATE 900 MG/50ML IV SOLN
900.0000 mg | Freq: Once | INTRAVENOUS | Status: AC
  Administered 2019-02-25: 900 mg via INTRAVENOUS
  Filled 2019-02-25: qty 50

## 2019-02-25 MED ORDER — OXYCODONE HCL 5 MG PO TABS
5.0000 mg | ORAL_TABLET | Freq: Three times a day (TID) | ORAL | Status: DC
  Administered 2019-02-26 (×3): 5 mg via ORAL
  Filled 2019-02-25 (×3): qty 1

## 2019-02-25 MED ORDER — ACETAMINOPHEN 650 MG RE SUPP: 650.0000 mg | Freq: Four times a day (QID) | RECTAL | Status: DC | PRN

## 2019-02-25 MED ORDER — CARVEDILOL 12.5 MG PO TABS
6.2500 mg | ORAL_TABLET | Freq: Two times a day (BID) | ORAL | Status: DC
  Administered 2019-02-26 (×2): 6.25 mg via ORAL
  Filled 2019-02-25 (×3): qty 1

## 2019-02-25 MED ORDER — AMLODIPINE BESYLATE 5 MG PO TABS
5.0000 mg | ORAL_TABLET | Freq: Every day | ORAL | Status: DC
  Administered 2019-02-26: 5 mg via ORAL
  Filled 2019-02-25: qty 1

## 2019-02-25 MED ORDER — ACETAMINOPHEN 325 MG PO TABS: 650.0000 mg | ORAL_TABLET | Freq: Four times a day (QID) | ORAL | Status: DC | PRN

## 2019-02-25 MED ORDER — FUROSEMIDE 20 MG PO TABS
80.0000 mg | ORAL_TABLET | Freq: Every day | ORAL | Status: DC
  Administered 2019-02-26: 80 mg via ORAL
  Filled 2019-02-25: qty 4

## 2019-02-25 MED ORDER — ONDANSETRON HCL 4 MG PO TABS: 4.0000 mg | ORAL_TABLET | Freq: Four times a day (QID) | ORAL | Status: DC | PRN

## 2019-02-25 NOTE — ED Notes (Signed)
Admitting physician at the bedside.

## 2019-02-25 NOTE — ED Triage Notes (Signed)
Brought by ems from home for c/o cellulitis to ble.  Not treated in about 3 months.  Was having nurses at home wrapping legs until march.

## 2019-02-25 NOTE — ED Notes (Signed)
Attempted report x1. 

## 2019-02-25 NOTE — H&P (Signed)
Date: 02/25/2019               Patient Name:  DUSTIE BRITTLE MRN: [id number]  DOB: 1951/08/22 Age / Sex: 68 y.o., female   PCP: Welford Roche, MD         Medical Service: Internal Medicine Teaching Service         Attending Physician: Dr. Rex Kras, Wenda Overland, MD    First Contact: Dr. Carmelina Paddock Pager: [phone]  Second Contact: Dr. Berline Lopes Pager: [phone]       After Hours (After 5p/  First Contact Pager: [phone]  weekends / holidays): Second Contact Pager: [phone]   Chief Complaint: "Cellulitis"  History of Present Illness: Rhona Fusilier is a 68 y.o female with HTN, HFpEF, and venous insufficiency resulting in BLE venous ulcers who presented to the ED with "cellulitis" of her legs. History was obtained by the patient and through chart review.    States that her legs have cellulitis. She has had it a couple times in the past with the last time being in January. She has CHF which results in BLE edema that subsequently develops into infections. She has been having wound care come by 2x per week until March. Since then she has stopped wearing her compression stockings and wound care has stopped coming out. Her legs have significantly gotten worse and this is why she came to the ED. Denies fevers, chills, N/V, abdominal pain, headaches, visual changes, focal weakness, CP, SHOB. She does occasionally have shooting pain down her legs. She is unsure if she has any ulcers on her legs stating "I can't see them." Requesting pain medications and the need for sleeping medications.   Meds:  No current facility-administered medications on file prior to encounter.    Current Outpatient Medications on File Prior to Encounter  Medication Sig Dispense Refill  . amLODipine (NORVASC) 5 MG tablet Take 1 tablet (5 mg total) by mouth daily. 30 tablet 1  . carvedilol (COREG) 6.25 MG tablet Take 1 tablet (6.25 mg total) by mouth 2 (two) times daily with a meal. 60 tablet 1  .  diphenhydrAMINE-APAP, sleep, (TYLENOL PM EXTRA STRENGTH PO) Take 1 tablet by mouth See admin instructions. Take one tablet by mouth every 4 hours during the night as needed for sleep    . furosemide (LASIX) 80 MG tablet Take 1 tablet (80 mg total) by mouth daily. Take 1 tablet twice a day if you gain 3 pounds in 1 week or 5 pounds in 1 day. (Patient taking differently: Take 80 mg by mouth daily. ) 60 tablet 5  . lisinopril (PRINIVIL,ZESTRIL) 2.5 MG tablet Take 1 tablet (2.5 mg total) by mouth daily. 30 tablet 1  . methocarbamol (ROBAXIN) 500 MG tablet TAKE 2 TABLETS BY MOUTH EVERY 6 HOURS AS NEEDED FOR MUSCLE SPASMS (Patient taking differently: Take 1,000 mg by mouth every 6 (six) hours as needed for muscle spasms. ) 120 tablet 1  . oxyCODONE-acetaminophen (PERCOCET) 10-325 MG tablet Take 1 tablet by mouth 3 (three) times daily.     . potassium chloride (K-DUR) 10 MEQ tablet Take 1 tablet (10 mEq total) by mouth daily. 30 tablet 1  . promethazine (PHENERGAN) 12.5 MG tablet Take 1 tablet (12.5 mg total) by mouth every 6 (six) hours as needed for nausea or vomiting. 30 tablet 0  . Sennosides (EX-LAX PO) Take 1 tablet by mouth daily as needed (constipation).    Marland Kitchen senna-docusate (SENOKOT S) 8.6-50 MG tablet Take 2 tablets by  mouth 2 (two) times daily. (Patient not taking: Reported on 02/25/2019) 120 tablet 3   Allergies: Allergies as of 02/25/2019  . (No Known Allergies)   Past Medical History:  Diagnosis Date  . Arthritis    lumbar burst fx, osteoporosis  . Back pain 09/29/2013  . Blood dyscrasia   . Chest pain    denies  . Chronic kidney disease    passed stones spontaneously- 40 yrs. ago   . Depression    states her current situation with activity limitations, she has a low spirit at times.   Marland Kitchen Dysrhythmia    occ tachycardia  . Fracture of vertebra, compression (Normal)   . GERD (gastroesophageal reflux disease)    occ  . History of kidney stones   . HTN (hypertension)   . ITP (idiopathic  thrombocytopenic purpura) 08/29/2013  . Leukocytosis, unspecified 09/05/2013  . Muscle cramp 11/16/2013  . Tachycardia    occ upon exersion, and when put on bp med   Family History  Problem Relation Age of Onset  . Hypertension Other   . Heart attack Other   . Leukemia Paternal Uncle        leukemia   Social History   Socioeconomic History  . Marital status: Divorced    Spouse name: Not on file  . Number of children: Not on file  . Years of education: Not on file  . Highest education level: Not on file  Occupational History  . Not on file  Social Needs  . Financial resource strain: Not on file  . Food insecurity    Worry: Not on file    Inability: Not on file  . Transportation needs    Medical: Not on file    Non-medical: Not on file  Tobacco Use  . Smoking status: Former Smoker    Packs/day: 0.50    Years: 23.00    Pack years: 11.50    Types: Cigarettes    Quit date: 09/15/2014    Years since quitting: 4.4  . Smokeless tobacco: Never Used  Substance and Sexual Activity  . Alcohol use: No  . Drug use: No  . Sexual activity: Not on file  Lifestyle  . Physical activity    Days per week: Not on file    Minutes per session: Not on file  . Stress: Not on file  Relationships  . Social Herbalist on phone: Not on file    Gets together: Not on file    Attends religious service: Not on file    Active member of club or organization: Not on file    Attends meetings of clubs or organizations: Not on file    Relationship status: Not on file  Other Topics Concern  . Not on file  Social History Narrative  . Not on file   Review of Systems: A complete ROS was negative except as per HPI.   Physical Exam: Blood pressure (!) 107/48, pulse 95, temperature 98.3 F (36.8 C), temperature source Oral, resp. rate (!) 21, height 5\' 2"  (1.575 m), weight 92.1 kg, SpO2 95 %.  General: Morbidly obese female in no acute distress HENT: Normocephalic, atraumatic, moist  mucus membranes Pulm: Good air movement with no wheezing or crackles  CV: RRR, no murmurs, no rubs  Abdomen: Active bowel sounds, soft, non-distended, no tenderness to palpation  Extremities: Swollen BLEs. Lichenification of the BLEs. No tenderness to palpation. No increased erythema   Skin: Warm and dry  Neuro: Alert  and oriented x 3      CXR: personally reviewed my interpretation is good penetration. Poor inspiration and rotation. Prior dental work but otherwise no bony or soft tissue abnormalities. Difficult to assess for cardiomegaly given rotation.   Assessment & Plan by Problem: Principal Problem:   Venous stasis dermatitis Active Problems:   HTN (hypertension)   BMI 45.0-49.9, adult (HCC)  Chamaine Stankus is a 68 y.o female with HTN, HFpEF, and venous insufficiency resulting in BLE venous ulcers who presented to the ED with "cellulitis" of her legs. She was subsequently admitted for further evaluation and management.   Venous Stasis Dermatitis  - No evidence of cellulitis or systemic infection at this point  - No ABIs on file. Will need these. Can obtain as outpatient. - BLE x-rays unremarkable  - Continue Lasix  - Wound care consult  - Would benefit form compression stockings and topical steroids for her lichenification. Will start after wound care consult - Will need home health / wound care on discharge   HTN - Continue Amlodipine, Lisinopril, Furosemide, and Carvedilol   Chronic Back Pain 2/2 L1 Fracture  - Continue Oxycodone PRN for pain  - Bowel regimen with Senokot-S BID  Idiopathic Thrombocytopenic Purpura  - Plt 56K, near baseline  - No evidence of active bleeding  - Has not followed with oncology since 2015, previously on steroids and rituximab   Diet: Heart Healthy VTE ppx: Lovenox CODE STATUS: Full Code  Dispo: Admit patient to Observation with expected length of stay less than 2 midnights.  Signed: Ina Homes, MD 02/25/2019, 10:48 PM

## 2019-02-25 NOTE — ED Provider Notes (Signed)
[facility] Provider Note   CSN: [id number] Arrival date & time: 02/25/19  1932    History   Chief Complaint Chief Complaint  Patient presents with  . Cellulitis    HPI [name] is a 68 y.o. female.     The history is provided by the patient, the EMS personnel and medical records.  Foot Pain This is a recurrent problem. The current episode started more than 1 week ago. The problem occurs constantly. The problem has been gradually worsening. Pertinent negatives include no chest pain, no abdominal pain, no headaches and no shortness of breath. The symptoms are aggravated by walking, exertion, stress and standing. Nothing relieves the symptoms. She has tried rest, water, a warm compress and a cold compress for the symptoms. The treatment provided no relief.    Past Medical History:  Diagnosis Date  . Arthritis    lumbar burst fx, osteoporosis  . Back pain 09/29/2013  . Blood dyscrasia   . Chest pain    denies  . Chronic kidney disease    passed stones spontaneously- 40 yrs. ago   . Depression    states her current situation with activity limitations, she has a low spirit at times.   Marland Kitchen Dysrhythmia    occ tachycardia  . Fracture of vertebra, compression ([place])   . GERD (gastroesophageal reflux disease)    occ  . History of kidney stones   . HTN (hypertension)   . ITP (idiopathic thrombocytopenic purpura) 08/29/2013  . Leukocytosis, unspecified 09/05/2013  . Muscle cramp 11/16/2013  . Tachycardia    occ upon exersion, and when put on bp med    Patient Active Problem List   Diagnosis Date Noted  . Encounter for preventive care 10/20/2018  . Chronic respiratory failure with hypoxia and hypercapnia ([place]) 10/06/2018  . Polycythemia, secondary 10/05/2018  . Obesity hypoventilation syndrome ([place]) 10/04/2018  . BMI 45.0-49.9, adult ([place])   . Bilateral lower extremity edema   . Venous stasis dermatitis 09/30/2018  . Wound of  lower extremity, bilateral 09/30/2018  . Chronic heart failure with preserved ejection fraction with moderate RV failure 06/09/2017  . GERD (gastroesophageal reflux disease) 11/13/2014  . Opiate-induced constipation 11/13/2014  . History of tobacco abuse 03/10/2014  . Chronic back pain secondary to traumatic L1 fracture 09/29/2013  . Idiopathic thrombocytopenic purpura ([place]) 08/29/2013  . HTN (hypertension)     Past Surgical History:  Procedure Laterality Date  . ABDOMINAL HYSTERECTOMY    . ANTERIOR LAT LUMBAR FUSION N/A 03/28/2014   Procedure: Anterolateral decompression of L1 fracture with posterior segmental stabilization;  Surgeon: Kristeen Miss, MD;  Location: [facility];  Service: Neurosurgery;  Laterality: N/A;  Anterolateral decompression of Lumbar One fracture with posterior segmental stabilization  . CESAREAN SECTION     x2  . CHEST TUBE INSERTION Left 03/28/2014   Procedure: CHEST TUBE INSERTION;  Surgeon: Ivin Poot, MD;  Location: [facility];  Service: Thoracic;  Laterality: Left;  . INCISIONAL HERNIA REPAIR N/A 11/13/2014   Procedure: PRIMARY REPAIR INCARCERATED INCISIONAL HERNIA WITH PLACEMENT BIOLOGIC MESH;  Surgeon: Rolm Bookbinder, MD;  Location: [facility];  Service: General;  Laterality: N/A;  . LAPAROTOMY Right 09   ovarian cyst  . TOTAL ABDOMINAL HYSTERECTOMY W/ BILATERAL SALPINGOOPHORECTOMY  97     OB History   No obstetric history on file.      Home Medications    Prior to Admission medications   Medication Sig Start  Date End Date Taking? Authorizing Provider  amLODipine (NORVASC) 5 MG tablet Take 1 tablet (5 mg total) by mouth daily. 06/21/17   Caren Griffins, MD  carvedilol (COREG) 6.25 MG tablet Take 1 tablet (6.25 mg total) by mouth 2 (two) times daily with a meal. 06/20/17   Gherghe, Vella Redhead, MD  furosemide (LASIX) 80 MG tablet Take 1 tablet (80 mg total) by mouth daily. Take 1 tablet twice a day if you gain 3 pounds in 1 week or 5 pounds in 1  day. 11/03/18   Welford Roche, MD  lisinopril (PRINIVIL,ZESTRIL) 2.5 MG tablet Take 1 tablet (2.5 mg total) by mouth daily. 06/21/17   Gherghe, Vella Redhead, MD  methocarbamol (ROBAXIN) 500 MG tablet TAKE 2 TABLETS BY MOUTH EVERY 6 HOURS AS NEEDED FOR MUSCLE SPASMS 02/17/19   Welford Roche, MD  oxyCODONE-acetaminophen (PERCOCET) 10-325 MG tablet Take 1 tablet by mouth 3 (three) times daily as needed for pain.    [provider]  potassium chloride (K-DUR) 10 MEQ tablet Take 1 tablet (10 mEq total) by mouth daily. 06/20/17   Caren Griffins, MD  promethazine (PHENERGAN) 12.5 MG tablet Take 1 tablet (12.5 mg total) by mouth every 6 (six) hours as needed for nausea or vomiting. 10/12/18   Carroll Sage, MD  senna-docusate (SENOKOT S) 8.6-50 MG tablet Take 2 tablets by mouth 2 (two) times daily. 10/20/18   Welford Roche, MD    Family History Family History  Problem Relation Age of Onset  . Hypertension Other   . Heart attack Other   . Leukemia Paternal Uncle        leukemia    Social History Social History   Tobacco Use  . Smoking status: Former Smoker    Packs/day: 0.50    Years: 23.00    Pack years: 11.50    Types: Cigarettes    Quit date: 09/15/2014    Years since quitting: 4.4  . Smokeless tobacco: Never Used  Substance Use Topics  . Alcohol use: No  . Drug use: No     Allergies   Patient has no known allergies.   Review of Systems Review of Systems  Respiratory: Negative for shortness of breath.   Cardiovascular: Negative for chest pain.  Gastrointestinal: Negative for abdominal pain.  Neurological: Negative for headaches.  All other systems reviewed and are negative.    Physical Exam Updated Vital Signs BP 127/70   Pulse 91   Temp 98.3 F (36.8 C) (Oral)   Resp 19   Ht 5\' 2"  (1.575 m)   Wt 92.1 kg   SpO2 96%   BMI 37.14 kg/m   Physical Exam Vitals signs and nursing note reviewed.  Constitutional:      General: She is not in  acute distress.    Appearance: She is well-developed.  HENT:     Head: Normocephalic and atraumatic.  Eyes:     Conjunctiva/sclera: Conjunctivae normal.  Neck:     Musculoskeletal: Neck supple.  Cardiovascular:     Rate and Rhythm: Normal rate and regular rhythm.     Heart sounds: No murmur.  Pulmonary:     Effort: Pulmonary effort is normal. No respiratory distress.     Breath sounds: Normal breath sounds.  Abdominal:     Palpations: Abdomen is soft.     Tenderness: There is no abdominal tenderness.  Musculoskeletal:        General: Tenderness present.     Right lower leg: Edema present.  Left lower leg: Edema present.     Comments: Bilateral lower extremities mid calf down to ankle diffuse erythema, granulation tissue, skin breakdown, serosanguineous and purulent material appreciated  No obvious crepitus appreciated  No obvious focal area of fluctuance appreciated  Sensation intact bilateral lower extremities, 2+ pedal pulses bilateral lower extremities, patient able to move bilateral lower extremities spontaneously  Skin:    General: Skin is warm and dry.  Neurological:     Mental Status: She is alert.      ED Treatments / Results  Labs (all labs ordered are listed, but only abnormal results are displayed) Labs Reviewed  CBC WITH DIFFERENTIAL/PLATELET - Abnormal; Notable for the following components:      Result Value   Platelets 56 (*)    All other components within normal limits  BASIC METABOLIC PANEL - Abnormal; Notable for the following components:   Chloride 97 (*)    Glucose, Bld 112 (*)    BUN 24 (*)    All other components within normal limits  NOVEL CORONAVIRUS, NAA (HOSPITAL ORDER, SEND-OUT TO REF LAB)  PROTIME-INR  HEPATIC FUNCTION PANEL  BRAIN NATRIURETIC PEPTIDE    EKG None  Radiology Dg Ankle 2 Views Left  Result Date: 02/25/2019 CLINICAL DATA:  Cellulitis EXAM: LEFT ANKLE - 2 VIEW COMPARISON:  None. FINDINGS: Marked diffuse soft tissue  swelling. No acute bony abnormality. Specifically, no fracture, subluxation, or dislocation. No radiographic changes of osteomyelitis. IMPRESSION: No acute bony abnormality. Electronically Signed   By: Rolm Baptise M.D.   On: 02/25/2019 20:23   Dg Ankle 2 Views Right  Result Date: 02/25/2019 CLINICAL DATA:  Cellulitis EXAM: RIGHT ANKLE - 2 VIEW COMPARISON:  None. FINDINGS: Marked soft tissue swelling diffusely. No acute bony abnormality. Specifically, no fracture, subluxation, or dislocation. No radiographic changes of osteomyelitis. IMPRESSION: No acute bony abnormality. Electronically Signed   By: Rolm Baptise M.D.   On: 02/25/2019 20:23   Dg Chest Portable 1 View  Result Date: 02/25/2019 CLINICAL DATA:  Cellulitis EXAM: PORTABLE CHEST 1 VIEW COMPARISON:  09/29/2018 FINDINGS: Cardiomegaly with vascular congestion. Bibasilar atelectasis or scarring. No acute confluent airspace opacities or effusions. No acute bony abnormality. IMPRESSION: Cardiomegaly with vascular congestion. Bibasilar atelectasis or scarring. Electronically Signed   By: Rolm Baptise M.D.   On: 02/25/2019 20:22    Procedures Procedures (including critical care time)  Medications Ordered in ED Medications  clindamycin (CLEOCIN) IVPB 900 mg (900 mg Intravenous New Bag/Given 02/25/19 2114)     Initial Impression / Assessment and Plan / ED Course  I have reviewed the triage vital signs and the nursing notes.  Pertinent labs & imaging results that were available during my care of the patient were reviewed by me and considered in my medical decision making (see chart for details).        Medical Decision Making: [name] is a 68 y.o. female who presented to the ED today with bilateral lower extremity swelling, tenderness, skin changes.  Past medical history significant for chronic diastolic congestive heart failure, chronic kidney disease, hypertension, ITP, and chronic back pain  Reviewed and confirmed nursing  documentation for past medical history, family history, social history.  On my initial exam, the pt was appears uncomfortable, cooperative, conversant, follows commands appropriately, GCS 15, not tachycardic, not hypotensive, afebrile, no increased work of breathing or respiratory distress, no signs of impending respiratory failure.   Concern for worsening skin infection, no obvious focal fluctuance concerning for abscess, will obtain  plain films to assess for osteomyelitis, history of severe venous stasis requiring admission, concern for recurrence of infected skin ulcer secondary to venous stasis Patient given IV antibiotics Plain films do not show any osteomyelitis Creatinine 0.61, sodium 138, white blood cells 10.3, hemoglobin 13.6 All radiology and laboratory studies reviewed independently and with my attending physician, agree with reading provided by radiologist unless otherwise noted.  Upon reassessing patient, patient was calm, resting comfortably. Based on the above findings, I believe patient requires admission for further evaluation and care of bilateral lower extremity wounds. Patient admitted. The above care was discussed with and agreed upon by my attending physician. Emergency Department Medication Summary:  Medications  clindamycin (CLEOCIN) IVPB 900 mg (900 mg Intravenous New Bag/Given 02/25/19 2114)    Final Clinical Impressions(s) / ED Diagnoses   Final diagnoses:  Cellulitis of right lower extremity    ED Discharge Orders    None       Lonzo Candy, MD 02/25/19 2232    Rex Kras, Wenda Overland, MD 02/27/19 1614

## 2019-02-25 NOTE — ED Notes (Signed)
ED TO INPATIENT HANDOFF REPORT  ED Nurse Name and Phone #: Lott Seelbach [phone]  S Name/Age/Gender [name] 68 y.o. female Room/Bed: 016C/016C  Code Status   Code Status: Prior  Home/SNF/Other Home Patient oriented to: self, place, time and situation Is this baseline? Yes   Triage Complete: Triage complete  Chief Complaint cellulitis   Triage Note Brought by ems from home for c/o cellulitis to ble.  Not treated in about 3 months.  Was having nurses at home wrapping legs until march.   Allergies No Known Allergies  Level of Care/Admitting Diagnosis ED Disposition    None      B Medical/Surgery History Past Medical History:  Diagnosis Date  . Arthritis    lumbar burst fx, osteoporosis  . Back pain 09/29/2013  . Blood dyscrasia   . Chest pain    denies  . Chronic kidney disease    passed stones spontaneously- 40 yrs. ago   . Depression    states her current situation with activity limitations, she has a low spirit at times.   Marland Kitchen Dysrhythmia    occ tachycardia  . Fracture of vertebra, compression ([place])   . GERD (gastroesophageal reflux disease)    occ  . History of kidney stones   . HTN (hypertension)   . ITP (idiopathic thrombocytopenic purpura) 08/29/2013  . Leukocytosis, unspecified 09/05/2013  . Muscle cramp 11/16/2013  . Tachycardia    occ upon exersion, and when put on bp med   Past Surgical History:  Procedure Laterality Date  . ABDOMINAL HYSTERECTOMY    . ANTERIOR LAT LUMBAR FUSION N/A 03/28/2014   Procedure: Anterolateral decompression of L1 fracture with posterior segmental stabilization;  Surgeon: Kristeen Miss, MD;  Location: [facility];  Service: Neurosurgery;  Laterality: N/A;  Anterolateral decompression of Lumbar One fracture with posterior segmental stabilization  . CESAREAN SECTION     x2  . CHEST TUBE INSERTION Left 03/28/2014   Procedure: CHEST TUBE INSERTION;  Surgeon: Ivin Poot, MD;  Location: [facility];  Service: Thoracic;   Laterality: Left;  . INCISIONAL HERNIA REPAIR N/A 11/13/2014   Procedure: PRIMARY REPAIR INCARCERATED INCISIONAL HERNIA WITH PLACEMENT BIOLOGIC MESH;  Surgeon: Rolm Bookbinder, MD;  Location: [facility];  Service: General;  Laterality: N/A;  . LAPAROTOMY Right 09   ovarian cyst  . TOTAL ABDOMINAL HYSTERECTOMY W/ BILATERAL SALPINGOOPHORECTOMY  97     A IV Location/Drains/Wounds Patient Lines/Drains/Airways Status   Active Line/Drains/Airways    Name:   Placement date:   Placement time:   Site:   Days:   Peripheral IV 02/25/19 Left Hand   02/25/19    1940    Hand   less than 1   External Urinary Catheter   09/29/18    1830    -   149   Wound / Incision (Open or Dehisced) 09/30/18 Other (Comment) Leg Right;Left cellulitis bil legs    09/30/18    -    Leg   148          Intake/Output Last 24 hours  Intake/Output Summary (Last 24 hours) at 02/25/2019 2243 Last data filed at 02/25/2019 2144 Gross per 24 hour  Intake 100 ml  Output -  Net 100 ml    Labs/Imaging Results for orders placed or performed during the hospital encounter of 02/25/19 (from the past 48 hour(s))  CBC with Differential     Status: Abnormal   Collection Time: 02/25/19  7:42 PM  Result Value Ref  Range   WBC 10.3 4.0 - 10.5 K/uL   RBC 4.82 3.87 - 5.11 MIL/uL   Hemoglobin 13.6 12.0 - 15.0 g/dL   HCT 43.4 36.0 - 46.0 %   MCV 90.0 80.0 - 100.0 fL   MCH 28.2 26.0 - 34.0 pg   MCHC 31.3 30.0 - 36.0 g/dL   RDW 14.5 11.5 - 15.5 %   Platelets 56 (L) 150 - 400 K/uL    Comment: REPEATED TO VERIFY SPECIMEN CHECKED FOR CLOTS Immature Platelet Fraction may be clinically indicated, consider ordering this additional test [id number] CONSISTENT WITH PREVIOUS RESULT    nRBC 0.0 0.0 - 0.2 %   Neutrophils Relative % 71 %   Neutro Abs 7.2 1.7 - 7.7 K/uL   Lymphocytes Relative 18 %   Lymphs Abs 1.8 0.7 - 4.0 K/uL   Monocytes Relative 6 %   Monocytes Absolute 0.7 0.1 - 1.0 K/uL   Eosinophils Relative 4 %   Eosinophils Absolute  0.5 0.0 - 0.5 K/uL   Basophils Relative 0 %   Basophils Absolute 0.0 0.0 - 0.1 K/uL   Immature Granulocytes 1 %   Abs Immature Granulocytes 0.05 0.00 - 0.07 K/uL    Comment: Performed at [facility], [address] [place], [place] [address]  Basic metabolic panel     Status: Abnormal   Collection Time: 02/25/19  7:42 PM  Result Value Ref Range   Sodium 138 135 - 145 mmol/L   Potassium 4.2 3.5 - 5.1 mmol/L   Chloride 97 (L) 98 - 111 mmol/L   CO2 31 22 - 32 mmol/L   Glucose, Bld 112 (H) 70 - 99 mg/dL   BUN 24 (H) 8 - 23 mg/dL   Creatinine, Ser 0.61 0.44 - 1.00 mg/dL   Calcium 9.3 8.9 - 10.3 mg/dL   GFR calc non Af Amer >60 >60 mL/min   GFR calc Af Amer >60 >60 mL/min   Anion gap 10 5 - 15    Comment: Performed at [facility], [place] [address], [place], [place] [address]  Protime-INR     Status: None   Collection Time: 02/25/19  7:42 PM  Result Value Ref Range   Prothrombin Time 13.1 11.4 - 15.2 seconds   INR 1.0 0.8 - 1.2    Comment: (NOTE) INR goal varies based on device and disease states. Performed at [facility], [place] [address], [place], [place] [address]   Hepatic function panel     Status: None   Collection Time: 02/25/19  7:42 PM  Result Value Ref Range   Total Protein 6.5 6.5 - 8.1 g/dL   Albumin 4.1 3.5 - 5.0 g/dL   AST 22 15 - 41 U/L   ALT 30 0 - 44 U/L   Alkaline Phosphatase 82 38 - 126 U/L   Total Bilirubin 0.7 0.3 - 1.2 mg/dL   Bilirubin, Direct 0.2 0.0 - 0.2 mg/dL   Indirect Bilirubin 0.5 0.3 - 0.9 mg/dL    Comment: Performed at [facility] [address], [place], [place] [address]  Brain natriuretic peptide     Status: None   Collection Time: 02/25/19  7:42 PM  Result Value Ref Range   B Natriuretic Peptide 13.9 0.0 - 100.0 pg/mL    Comment: Performed at [facility] [address], [place],  [address]   Dg Ankle 2 Views Left  Result Date: 02/25/2019 CLINICAL DATA:  Cellulitis EXAM: LEFT ANKLE -  2 VIEW  COMPARISON:  None. FINDINGS: Marked diffuse soft tissue swelling. No acute bony abnormality. Specifically, no fracture, subluxation, or dislocation. No radiographic changes of osteomyelitis. IMPRESSION: No acute bony abnormality. Electronically Signed   By: Rolm Baptise M.D.   On: 02/25/2019 20:23   Dg Ankle 2 Views Right  Result Date: 02/25/2019 CLINICAL DATA:  Cellulitis EXAM: RIGHT ANKLE - 2 VIEW COMPARISON:  None. FINDINGS: Marked soft tissue swelling diffusely. No acute bony abnormality. Specifically, no fracture, subluxation, or dislocation. No radiographic changes of osteomyelitis. IMPRESSION: No acute bony abnormality. Electronically Signed   By: Rolm Baptise M.D.   On: 02/25/2019 20:23   Dg Chest Portable 1 View  Result Date: 02/25/2019 CLINICAL DATA:  Cellulitis EXAM: PORTABLE CHEST 1 VIEW COMPARISON:  09/29/2018 FINDINGS: Cardiomegaly with vascular congestion. Bibasilar atelectasis or scarring. No acute confluent airspace opacities or effusions. No acute bony abnormality. IMPRESSION: Cardiomegaly with vascular congestion. Bibasilar atelectasis or scarring. Electronically Signed   By: Rolm Baptise M.D.   On: 02/25/2019 20:22    Pending Labs Unresulted Labs (From admission, onward)    Start     Ordered   02/25/19 2000  Novel Coronavirus,NAA,(SEND-OUT TO REF LAB - TAT 24-48 hrs); Hosp Order  (Asymptomatic Patients Labs)  Once,   STAT    Question:  Rule Out  Answer:  Yes   02/25/19 1959          Vitals/Pain Today's Vitals   02/25/19 2030 02/25/19 2115 02/25/19 2145 02/25/19 2230  BP: (!) 116/57 114/62 (!) 103/47 (!) 107/48  Pulse: 87 93 96 95  Resp: (!) 24 (!) 25 (!) 23 (!) 21  Temp:      TempSrc:      SpO2: 96% 97% 94% 95%  Weight:      Height:      PainSc:        Isolation Precautions No active isolations  Medications Medications  clindamycin (CLEOCIN) IVPB 900 mg (0 mg Intravenous Stopped 02/25/19 2144)    Mobility walks with device Low fall risk   Focused  Assessments skin   R Recommendations: See Admitting Provider Note  Report given to:   Additional Notes: patient is from home but definitely is not able to give herself the care she needs.  Very unkempt.  Reports her lower legs have not been washed since march.  May need placement at discharge.

## 2019-02-26 DIAGNOSIS — I872 Venous insufficiency (chronic) (peripheral): Principal | ICD-10-CM

## 2019-02-26 DIAGNOSIS — M549 Dorsalgia, unspecified: Secondary | ICD-10-CM

## 2019-02-26 DIAGNOSIS — I83009 Varicose veins of unspecified lower extremity with ulcer of unspecified site: Secondary | ICD-10-CM

## 2019-02-26 DIAGNOSIS — L97919 Non-pressure chronic ulcer of unspecified part of right lower leg with unspecified severity: Secondary | ICD-10-CM | POA: Diagnosis not present

## 2019-02-26 DIAGNOSIS — Z79891 Long term (current) use of opiate analgesic: Secondary | ICD-10-CM

## 2019-02-26 DIAGNOSIS — G8929 Other chronic pain: Secondary | ICD-10-CM

## 2019-02-26 DIAGNOSIS — L97929 Non-pressure chronic ulcer of unspecified part of left lower leg with unspecified severity: Secondary | ICD-10-CM | POA: Diagnosis not present

## 2019-02-26 DIAGNOSIS — I5030 Unspecified diastolic (congestive) heart failure: Secondary | ICD-10-CM

## 2019-02-26 DIAGNOSIS — M8448XD Pathological fracture, other site, subsequent encounter for fracture with routine healing: Secondary | ICD-10-CM

## 2019-02-26 DIAGNOSIS — Z79899 Other long term (current) drug therapy: Secondary | ICD-10-CM

## 2019-02-26 DIAGNOSIS — I110 Hypertensive heart disease with heart failure: Secondary | ICD-10-CM

## 2019-02-26 DIAGNOSIS — D693 Immune thrombocytopenic purpura: Secondary | ICD-10-CM

## 2019-02-26 LAB — CBC
HCT: 39.3 % (ref 36.0–46.0)
Hemoglobin: 12.4 g/dL (ref 12.0–15.0)
MCH: 28.2 pg (ref 26.0–34.0)
MCHC: 31.6 g/dL (ref 30.0–36.0)
MCV: 89.3 fL (ref 80.0–100.0)
Platelets: 62 10*3/uL — ABNORMAL LOW (ref 150–400)
RBC: 4.4 MIL/uL (ref 3.87–5.11)
RDW: 14.4 % (ref 11.5–15.5)
WBC: 14.2 10*3/uL — ABNORMAL HIGH (ref 4.0–10.5)
nRBC: 0 % (ref 0.0–0.2)

## 2019-02-26 LAB — BASIC METABOLIC PANEL
Anion gap: 10 (ref 5–15)
BUN: 18 mg/dL (ref 8–23)
CO2: 29 mmol/L (ref 22–32)
Calcium: 8.9 mg/dL (ref 8.9–10.3)
Chloride: 100 mmol/L (ref 98–111)
Creatinine, Ser: 0.57 mg/dL (ref 0.44–1.00)
GFR calc Af Amer: >60 mL/min (ref >60)
GFR calc non Af Amer: >60 mL/min (ref >60)
Glucose, Bld: 135 mg/dL — ABNORMAL HIGH (ref 70–99)
Potassium: 4.1 mmol/L (ref 3.5–5.1)
Sodium: 139 mmol/L (ref 135–145)

## 2019-02-26 LAB — SAVE SMEAR(SSMR), FOR PROVIDER SLIDE REVIEW

## 2019-02-26 MED ORDER — FLUTICASONE PROPIONATE 50 MCG/ACT NA SUSP
1.0000 | Freq: Every day | NASAL | Status: DC
  Administered 2019-02-26: 1 via NASAL
  Filled 2019-02-26: qty 16

## 2019-02-26 MED ORDER — METHOCARBAMOL 500 MG PO TABS
1000.0000 mg | ORAL_TABLET | Freq: Four times a day (QID) | ORAL | Status: DC | PRN
  Administered 2019-02-26 (×2): 1000 mg via ORAL
  Filled 2019-02-26 (×2): qty 2

## 2019-02-26 NOTE — Plan of Care (Signed)
  Problem: Skin Integrity: Goal: Skin integrity will improve Outcome: Not Progressing   Problem: Coping: Goal: Level of anxiety will decrease Outcome: Progressing   Problem: Education: Goal: Knowledge of General Education information will improve Description: Including pain rating scale, medication(s)/side effects and non-pharmacologic comfort measures Outcome: Progressing

## 2019-02-26 NOTE — Progress Notes (Signed)
Received referral to assist with [facility] RN and aide. Spoke with pt and she reports that she used [facility] in March for wound care and she wants to use them again. She reports that she has the support of her daughter who lives with her. Contacted Katina at [facility] and she accepted the referral. They will f/u on Wednesday and pt made aware. Notified RN of referral.

## 2019-02-26 NOTE — Progress Notes (Signed)
Orthopedic Tech Progress Note Patient Details:  [name] [birth year] [id number]  Ortho Devices Type of Ortho Device: Haematologist Ortho Device/Splint Interventions: Adjustment, Application, Ordered   Post Interventions Patient Tolerated: Well Instructions Provided: Care of device, Adjustment of device   Melony Overly T 02/26/2019, 12:18 AM

## 2019-02-26 NOTE — Progress Notes (Signed)
   Subjective: HD#0   Overnight: No acute overnight events  Today, [name] was examined at bedside and comfortably lying in bed.  She reported of nasal congestion which is chronic.  She states that she has these "on and off "and is consistent with seasonal allergies.  Prior to arrival, she was able to complete her ADLs and walk short distances.  Currently she denies lower extremity pain.  She endorses right lower extremity cramps which improves with her muscle relaxer.  She reiterated that she had home health that was managing her stasis dermatitis however this was discontinued after it improved.  Objective:  Vital signs in last 24 hours: Vitals:   02/25/19 2245 02/25/19 2300 02/26/19 0010 02/26/19 0542  BP: 104/61 105/68 (!) 133/52 (!) 122/48  Pulse: 94 99 (!) 102 98  Resp: (!) 21 16 18  (!) 22  Temp:   98.2 F (36.8 C) 98.3 F (36.8 C)  TempSrc:   Oral Oral  SpO2: 96% 95% 95% 93%  Weight:      Height:       Const: In no apparent distress, lying comfortably in bed, conversational HEENT: Atraumatic, normocephalic Resp: CTA BL, no wheezes, crackles, rhonchi CV: RRR, no murmurs, gallop, rub Abd: Bowel sounds present, nondistended, nontender to palpation Ext: Bilateral lower extremity wrapped in Unna boots, noticeable malodorous smell  Assessment/Plan:  Principal Problem:   Venous stasis dermatitis Active Problems:   HTN (hypertension)   BMI 45.0-49.9, adult (HCC)   Venous stasis dermatitis of both lower extremities  [name] is a 68 y.o female with HTN, HFpEF, and venous insufficiency resulting in BLE venous ulcers who presented to the ED with "cellulitis" of her legs. She was subsequently admitted for further evaluation and management.   Venous Stasis Dermatitis  Her lower extremities are wrapped in Unna boots however there is still malodorous smell. Her lab work this morning does show leukocytosis of 14<10 on admission. She has remained afebrile and is status  post 1 dose of Clindamycin.  Her presenting symptoms in correlation to physical exams finding makes cellulitis very less likely.  She probably has superficial wound infection and will require aggressive wound management. - Appreciate wound care assistance  - Continue Lasix  HTN BP stable this morning  - Continue Amlodipine, Lisinopril, Furosemide, and Carvedilol   Chronic Back Pain 2/2 L1 Fracture  - Continue Oxycodone PRN for pain  - Bowel regimen with Senokot-S BID  Idiopathic Thrombocytopenic Purpura  She had been following up with Oncology until 2015. Prior medications include steroids, rituximab and Romiplostim. Her platelets today is 62<56 (around her baseline). - Continue to monitor  - If signs of bleeding (gingival, epistaxis etc and plt <30K, will require IVIG and/or glucocorticoids) - Review blood smear  Diet: Heart Healthy VTE ppx: Lovenox CODE STATUS: Full Code  Dispo: Anticipate discharge in 1 day.   Jean Rosenthal, MD 02/26/2019, 6:44 AM Pager: [phone] Internal Medicine Teaching Service

## 2019-02-26 NOTE — Discharge Summary (Signed)
Name: [name] MRN: [id number] DOB: 12-Jun-1951 68 y.o. PCP: Welford Roche, MD  Date of Admission: 02/25/2019  7:32 PM Date of Discharge: 02/26/2019 Attending Physician: Dr. Rebeca Alert  Discharge Diagnosis: 1.  Venous stasis dermatitis 2.  Hypertension 3.  Chronic back pain secondary to L1 fracture 4.  Idiopathic thrombocytopenic purpura  Discharge Medications: Allergies as of 02/26/2019   No Known Allergies     Medication List    TAKE these medications   amLODipine 5 MG tablet Commonly known as: NORVASC Take 1 tablet (5 mg total) by mouth daily.   carvedilol 6.25 MG tablet Commonly known as: COREG Take 1 tablet (6.25 mg total) by mouth 2 (two) times daily with a meal.   EX-LAX PO Take 1 tablet by mouth daily as needed (constipation).   furosemide 80 MG tablet Commonly known as: LASIX Take 1 tablet (80 mg total) by mouth daily. Take 1 tablet twice a day if you gain 3 pounds in 1 week or 5 pounds in 1 day. What changed: additional instructions   lisinopril 2.5 MG tablet Commonly known as: ZESTRIL Take 1 tablet (2.5 mg total) by mouth daily.   methocarbamol 500 MG tablet Commonly known as: ROBAXIN TAKE 2 TABLETS BY MOUTH EVERY 6 HOURS AS NEEDED FOR MUSCLE SPASMS What changed:   how much to take  how to take this  when to take this  reasons to take this  additional instructions   oxyCODONE-acetaminophen 10-325 MG tablet Commonly known as: PERCOCET Take 1 tablet by mouth 3 (three) times daily.   potassium chloride 10 MEQ tablet Commonly known as: K-DUR Take 1 tablet (10 mEq total) by mouth daily.   promethazine 12.5 MG tablet Commonly known as: PHENERGAN Take 1 tablet (12.5 mg total) by mouth every 6 (six) hours as needed for nausea or vomiting.   senna-docusate 8.6-50 MG tablet Commonly known as: Senokot S Take 2 tablets by mouth 2 (two) times daily.   TYLENOL PM EXTRA STRENGTH PO Take 1 tablet by mouth See admin instructions. Take  one tablet by mouth every 4 hours during the night as needed for sleep       Disposition and follow-up:   [name] was discharged from [facility] in [place] condition.  At the hospital follow up visit please address:  1.  Venous stasis dermatitis: Please ensure proper wound care  2.  Labs / imaging needed at time of follow-up: Consider ABI  3.  Pending labs/ test needing follow-up: None  Follow-up Appointments: Follow-up Information    [facility]   Specialty: [place] Why: [facility] will provider home heatlh nursing and an aide. Contact information: [address] [place] [address] [phone]           Hospital Course by problem list: 1.  Venous stasis dermatitis: [name] is a 68 year old woman with hypertension, heart failure with preserved ejection fraction and chronic venous insufficiency that resulted in bilateral venous ulcers who presented to the hospital complaining of " cellulitis of her lower extremities."  On presentation, she did not appear to have any soft tissue swelling, erythema, warmness or tenderness to palpation.  Her laboratory findings also did not suggest cellulitis.  Prior to arrival, she had been receiving home health wound care assistance up until March when this was discontinued after her venous stasis improved.  She received Unna boots and was instructed to expect home health wound care assistance  to manage her chronic venous stasis and ulcers.  2.  Hypertension: Remained stable and she was continued on home antihypertensives (amlodipine, lisinopril, Lasix, Coreg)  3.  Chronic back pain secondary to L1 fracture: She was continued on home oxycodone  4.  Idiopathic thrombocytopenic purpura: Per chart review, she has previously received steroids, rituximab and Romiplostim.  She was followed up with hematology and oncology until 2015 when she was lost to follow-up.  Her platelets  remained stable with range 50s-60s.  Discharge Vitals:   BP (!) 107/53 (BP Location: Right Arm)   Pulse 83   Temp 99 F (37.2 C) (Oral)   Resp 18   Ht 5\' 2"  (1.575 m)   Wt 92.1 kg   SpO2 95%   BMI 37.14 kg/m   Pertinent Labs, Studies, and Procedures:  RIGHT ANKLE - 2 VIEW  COMPARISON:  None.  FINDINGS: Marked soft tissue swelling diffusely. No acute bony abnormality. Specifically, no fracture, subluxation, or dislocation. No radiographic changes of osteomyelitis.  IMPRESSION: No acute bony abnormality.   LEFT ANKLE - 2 VIEW  COMPARISON:  None.  FINDINGS: Marked diffuse soft tissue swelling. No acute bony abnormality. Specifically, no fracture, subluxation, or dislocation. No radiographic changes of osteomyelitis.  IMPRESSION: No acute bony abnormality.   PORTABLE CHEST 1 VIEW  COMPARISON:  09/29/2018  FINDINGS: Cardiomegaly with vascular congestion. Bibasilar atelectasis or scarring. No acute confluent airspace opacities or effusions. No acute bony abnormality.  IMPRESSION: Cardiomegaly with vascular congestion.  Bibasilar atelectasis or scarring.   Discharge Instructions: Discharge Instructions    Diet - low sodium heart healthy   Complete by: As directed    Discharge instructions   Complete by: As directed    [name],   It was a pleasure taking care of you here at the hospital during your stay.  You were admitted with concern of cellulitis.  However, we did not find any evidence of cellulitis on your blood work or physical exams.  We think that the stasis dermatitis has just worsened because you did not have wound care since March.  I have placed a new consult for home health to help you take care of the wound.  Take care! Dr. Eileen Stanford   Face-to-face encounter (required for Medicare/Medicaid patients)   Complete by: As directed    I [place] certify that this patient is under my care and that I, or a nurse practitioner or  physician's assistant working with me, had a face-to-face encounter that meets the physician face-to-face encounter requirements with this patient on 02/26/2019. The encounter with the patient was in whole, or in part for the following medical condition(s) which is the primary reason for home health care (List medical condition): Severe Stasis Dermatitis   The encounter with the patient was in whole, or in part, for the following medical condition, which is the primary reason for home health care: Severe Stasis Dermatitis   I certify that, based on my findings, the following services are medically necessary home health services: Nursing   Reason for Medically Necessary Home Health Services: Skilled Nursing- Complex Wound Care   My clinical findings support the need for the above services: Pain interferes with ambulation/mobility   Further, I certify that my clinical findings support that this patient is homebound due to: Pain interferes with ambulation/mobility   Home Health   Complete by: As directed    To provide the following care/treatments:  RN Home Health Aide     Increase activity slowly  Complete by: As directed       Signed: Jean Rosenthal, MD 02/28/2019, 10:33 AM   Pager: [phone] Internal Medicine Teaching Service

## 2019-02-26 NOTE — Plan of Care (Signed)
Ready to be discharge

## 2019-02-28 LAB — NOVEL CORONAVIRUS, NAA (HOSP ORDER, SEND-OUT TO REF LAB; TAT 18-24 HRS): SARS-CoV-2, NAA: NOT DETECTED

## 2019-03-03 ENCOUNTER — Telehealth: Payer: Self-pay | Admitting: *Deleted

## 2019-03-03 NOTE — Telephone Encounter (Signed)
[facility] calls back and corrects visit info: 2xweek for 8 week 1xweek for 1 week Echo aide 2x week for 7 weeks PT and OT for home care management, ADL's, safety and strengthening

## 2019-03-03 NOTE — Telephone Encounter (Signed)
Thanks, Bonnita Nasuti. I'm ok with that. I already messaged the front desk so they can add her to my schedule. She was admitted for cellulitis which is an issue for her frequently. If she calls before her appt with me reporting her wounds look worse she should evaluated in [facility].

## 2019-03-03 NOTE — Telephone Encounter (Signed)
[facility] HH2x week for 9 weeks for disease management and UNA boots and medication management. VO given  Pt was not told at disch from Phil Campbell when to have [place], are you agreeable to have him see you 6/29 in the pm in your cont. Clinic? If so please send a note to frontdesk to make appt. Please advise

## 2019-03-11 ENCOUNTER — Telehealth: Payer: Self-pay | Admitting: *Deleted

## 2019-03-11 NOTE — Telephone Encounter (Signed)
erin [facility]/ [facility], PT calls Reduce per pt PCS to 1x week PT is eval only, no continuation per pt desire FYI

## 2019-03-12 NOTE — Telephone Encounter (Signed)
Thank you :)

## 2019-03-14 ENCOUNTER — Encounter: Payer: Medicare Other | Admitting: Internal Medicine

## 2019-03-30 ENCOUNTER — Other Ambulatory Visit: Payer: Self-pay | Admitting: *Deleted

## 2019-03-31 MED ORDER — METHOCARBAMOL 500 MG PO TABS: ORAL_TABLET | ORAL | 3 refills | Status: DC

## 2019-06-16 ENCOUNTER — Other Ambulatory Visit: Payer: Self-pay | Admitting: *Deleted

## 2019-06-16 MED ORDER — METHOCARBAMOL 500 MG PO TABS: ORAL_TABLET | ORAL | 0 refills | Status: DC

## 2019-06-16 NOTE — Telephone Encounter (Signed)
Patient needs appt with me. Have not seen her since 10/2018.

## 2019-06-22 ENCOUNTER — Telehealth: Payer: Self-pay | Admitting: *Deleted

## 2019-06-22 MED ORDER — TIZANIDINE HCL 2 MG PO CAPS: 2.0000 mg | ORAL_CAPSULE | Freq: Four times a day (QID) | ORAL | 1 refills | Status: DC | PRN

## 2019-06-22 NOTE — Telephone Encounter (Signed)
Call to [place] for PA for Methacarbamol 500 mg tablets.  Methocarbamol is not on patient's insurance formulary.  Altrenative medications are Tizanidine, Etodolac 400 mg tabs, Naprosyn 250 mg, Ibuprofen 400 mg and Flurprofen 100 mg tablets.  Message to be sent to Dr. Isac Sarna to consider a change.  Sander Nephew, RN 06/22/2019 11:11 AM

## 2019-06-22 NOTE — Telephone Encounter (Signed)
Will switch to Tizanidine.

## 2019-07-18 ENCOUNTER — Encounter: Payer: Medicare Other | Admitting: Internal Medicine

## 2019-08-18 ENCOUNTER — Telehealth: Payer: Self-pay | Admitting: Internal Medicine

## 2019-08-18 ENCOUNTER — Other Ambulatory Visit: Payer: Self-pay

## 2019-08-18 ENCOUNTER — Encounter: Payer: Self-pay | Admitting: Internal Medicine

## 2019-08-18 ENCOUNTER — Ambulatory Visit (INDEPENDENT_AMBULATORY_CARE_PROVIDER_SITE_OTHER): Payer: Medicare Other | Admitting: Internal Medicine

## 2019-08-18 DIAGNOSIS — L97919 Non-pressure chronic ulcer of unspecified part of right lower leg with unspecified severity: Secondary | ICD-10-CM | POA: Diagnosis not present

## 2019-08-18 DIAGNOSIS — L97929 Non-pressure chronic ulcer of unspecified part of left lower leg with unspecified severity: Secondary | ICD-10-CM | POA: Diagnosis not present

## 2019-08-18 DIAGNOSIS — S81809D Unspecified open wound, unspecified lower leg, subsequent encounter: Secondary | ICD-10-CM

## 2019-08-18 DIAGNOSIS — I83209 Varicose veins of unspecified lower extremity with both ulcer of unspecified site and inflammation: Secondary | ICD-10-CM | POA: Diagnosis not present

## 2019-08-18 DIAGNOSIS — I872 Venous insufficiency (chronic) (peripheral): Secondary | ICD-10-CM

## 2019-08-18 NOTE — Telephone Encounter (Signed)
We would need to see her for wounds.  Do we have capability to do a video visit?   Otherwise, I don't know if we're going to be able to do much to help over the phone for her legs.   Please attempt to set up for video through [place] or [place] if available.

## 2019-08-18 NOTE — Assessment & Plan Note (Signed)
Venous stasis dermatitis of both lower extremities: Bilateral lower extremity leg wounds: The patient was unable to visit the pain today for in person exam.  As such, we conducted a HIPAA secure video chat using [employer].  There was a technical difficulty or end the patient could not hear the physician.  As such we utilized the offices clinic phone for the verbal portion the video phone for the visual.  I explained the limitations of the televisit to the patient and that in person visit with preferred.  There are risks associated with such visits which were explained.  The patient is daughter assisted in the exam. On telemetry evaluation of the leg wounds there appeared to be persistent venous stasis dermatitis of the lower extremities with significant discharge. They were partially wrapped with an Ace bandage which the patient preferred not to remove due to concern for adherence to the wounds.  She denies fever, chills, muscle aches or pains, edema or erythema of the affected legs. I feel that the patient is in need of home health care for lower extremity wounds.  I have also assisted that she be seen in the clinic.  The plan as it stands this for me to refer to home health but if they are not able to see her within 2-3 business days she will need to schedule an appointment in person for our clinic.  Plan: Referral placed to home health Patient given strict return precautions including development of fever, weakness, chills, malaise muscle aches, purulent drainage, redness or swelling of the affected area or other concerns for which she should call 911 if our clinic is unavailable.

## 2019-08-18 NOTE — Progress Notes (Signed)
I connected with  MANISHA CAROL on 08/18/19 by a video enabled telemedicine application and verified that I am speaking with the correct person using two identifiers.   I discussed the limitations of evaluation and management by telemedicine. The patient expressed understanding and agreed to proceed.    CC: draining bad smelling lower leg wounds  HPI:Ms.RAYYAN CUE is a 68 y.o. female who presents for evaluation of bilateral distal lower extremity venous stasis with dermatitis resulting purulence. Please see individual problem based A/P for details.  Past Medical History:  Diagnosis Date  . Arthritis    lumbar burst fx, osteoporosis  . Back pain 09/29/2013  . Blood dyscrasia   . Chest pain    denies  . Chronic kidney disease    passed stones spontaneously- 40 yrs. ago   . Depression    states her current situation with activity limitations, she has a low spirit at times.   Marland Kitchen Dysrhythmia    occ tachycardia  . Fracture of vertebra, compression ([place])   . GERD (gastroesophageal reflux disease)    occ  . History of kidney stones   . HTN (hypertension)   . ITP (idiopathic thrombocytopenic purpura) 08/29/2013  . Leukocytosis, unspecified 09/05/2013  . Muscle cramp 11/16/2013  . Tachycardia    occ upon exersion, and when put on bp med   Review of Systems:  ROS negative except as per HPI.  Assessment & Plan:   See Encounters Tab for problem based charting.  Patient discussed with Dr. Daryll Drown

## 2019-08-18 NOTE — Telephone Encounter (Signed)
RTC to daughter, she states pt cannot come to appt because she does not have portable 02. Could we see about getting some portable 02, have not seen pt in office in almost a year appt TELEHEALTH made for today 12/3 at 1315, daughter states both lower legs have open wounds and foul odor, states "I cant hardly stand being in the same room it smells so bad. She denies fevers Will send to dr Isac Sarna, attending, and clinic docs

## 2019-08-18 NOTE — Telephone Encounter (Signed)
Pls contact pt daughter  [phone], regarding mother leg

## 2019-08-18 NOTE — Assessment & Plan Note (Signed)
See A/P bilateral chronic venous stasis dermatitis for the same day

## 2019-08-19 NOTE — Progress Notes (Signed)
Internal Medicine Clinic Attending  Case discussed with Dr. Berline Lopes at the time of the visit.  We reviewed the resident's history telephone and video conversation and pertinent patient test results.  I agree with the assessment, diagnosis, and plan of care documented in the resident's note.

## 2019-08-22 ENCOUNTER — Telehealth: Payer: Self-pay

## 2019-08-22 NOTE — Telephone Encounter (Signed)
[name]  [name], [name], [name], [name], [name], Evelyn Brill, RN        Hey!   [facility] has accepted this pt and will set up [facility] for 12/8. Thank you!!

## 2019-08-22 NOTE — Telephone Encounter (Signed)
I have sent a community message to [facility] for Wound Care to see if they can assist the patient Silverio Decamp C12/7/202011:21 AM

## 2019-08-23 ENCOUNTER — Telehealth: Payer: Self-pay | Admitting: *Deleted

## 2019-08-23 ENCOUNTER — Telehealth: Payer: Self-pay | Admitting: Internal Medicine

## 2019-08-23 NOTE — Telephone Encounter (Signed)
Return call to [facility]) stated pt's wound was "really bad", worse wound she has seen in a long. Ace bandage was intact but was unsure when it had been changed or when pt has cleaned the wound or taken a bath; pt's long hair was matted in the wound. Stated she did not see any sign of cellulitis. Stated she will try unna boots; but will let us know if a wound ctr referral is needed.

## 2019-08-23 NOTE — Telephone Encounter (Signed)
Call from [facility], [place] - requesting verbal order for "UNA boots bilaterally" - stated pt has stasis dermatitis/ulcers BLE's and currently has ace wraps. VO given for unna boots. If not appropriate, let me know Thanks

## 2019-08-23 NOTE — Telephone Encounter (Signed)
Verdis Frederickson, RN with [facility] called requesting orders. SOC today, 08/23/2019. Hubbard Hartshorn, BSN, RN-BC

## 2019-08-23 NOTE — Telephone Encounter (Signed)
Pls contact regarding [phone]

## 2019-08-25 NOTE — Telephone Encounter (Signed)
chilon ask me to call pt and see if I could talk to her and schedule an appt. I called and she has several reasons:  1) states she does not always have transportation 2) states she can not carry the )@ tank that she has been given that it is too heavy 3) she states she is afraid of COVID 4) states she can not wear a mask, that it makes her thrash and fight to get it off I ask her to please try to arrange transportation and to please reconsider an appt so she would not end up with a much worse wound that would require hosp stay and possibly surgery. She stated she would think about it but she didn't see why the dr couldn't just put her on abx without seeing her, I tried again to explain that it would be difficult to determine whatwas needed to treat her.

## 2019-08-25 NOTE — Telephone Encounter (Signed)
Can we schedule her for an in-person visit? I am concern her wounds may be infected and an Unna boot will not fix that.

## 2019-08-26 ENCOUNTER — Telehealth: Payer: Self-pay | Admitting: Internal Medicine

## 2019-08-26 NOTE — Telephone Encounter (Signed)
Thank you! I'll try calling her and talking to her as well.

## 2019-08-26 NOTE — Telephone Encounter (Signed)
Just wanted to make nurse/cma aware that will be unable to change patient's wound today because supplies have not arrived.  The type of dressing is a unaboot.  Last dressing was applied 08/23/2019 and right know the wound is good and dry and hoping supplies will be here next week.  If you have any questions please call Katie back with [place].

## 2019-08-29 NOTE — Telephone Encounter (Signed)
Thank you! Will be on the look out for a message tomorrow.

## 2019-08-29 NOTE — Telephone Encounter (Signed)
Returned call to nurse Katie with [facility].  Clarified when dressing supplies would arrive, as well as next dressing change.  Per Joellen Jersey, dressing supplies (unaboot supplies) should be in tomorrow morning and pt's primary nurse, Eustaquio Maize, will see patient tomorrow for dressing change.  Joellen Jersey states if for some reason unaboot supplies do not come in, primary nurse will still see patient tomorrow and assess pt's wound.  Primary nurse will call [facility] if not improved.  Will forward to PCP. Higinio Roger, RN,BSN

## 2019-09-05 ENCOUNTER — Telehealth: Payer: Self-pay | Admitting: Internal Medicine

## 2019-09-05 NOTE — Telephone Encounter (Signed)
[facility] Rn calling to request and extension for nursing visits  Please call nurse back.

## 2019-09-05 NOTE — Telephone Encounter (Signed)
Yes

## 2019-09-05 NOTE — Telephone Encounter (Signed)
VO for 2x week for 3 weeks for continued UNA boot given Do you agree?

## 2019-09-19 ENCOUNTER — Telehealth: Payer: Self-pay | Admitting: Internal Medicine

## 2019-09-19 NOTE — Telephone Encounter (Signed)
Return call to [facility] - stated needs a new order since pt's insurance had changed 09/16/19 for continue seeing pt, Verbal order given "Continue home health". Also stated pt does not have any open wounds,does not need unna boots. But has swelling; she will needs some type of compression but not compression stockings since she is unable to put them on and off. Need order (will take verbal order)for compression wraps or zip up compression stockings or whatever the doctor orders. Thanks

## 2019-09-19 NOTE — Telephone Encounter (Signed)
Beth from [facility] need VO [phone]

## 2019-09-21 NOTE — Telephone Encounter (Signed)
I'm ok with either wraps or zip ups as long as patient is able to take them off by self (this has been the main issue in the past).

## 2019-09-21 NOTE — Telephone Encounter (Signed)
Called Beth RN, [facility] - no answer; left message vo for wraps or zip up per Dr Isac Sarna on self identified vm. And to call if needed.

## 2019-10-04 ENCOUNTER — Telehealth: Payer: Self-pay | Admitting: Internal Medicine

## 2019-10-04 NOTE — Telephone Encounter (Signed)
HHN calls and states she would like VO for: Remove UNA BOOTS Has a little area from UNA boot rubbing R leg front of upper leg, would like to use some gauze bandages on that area to keep from getting bigger She will reinforce need for visit to clinic for compression hose that zip, pt states she has no help at home to assist in putting them on 1x week for 2 more weeks VO given, do you agree?

## 2019-10-04 NOTE — Telephone Encounter (Signed)
Beth from [place] [facility] requesting a callback [phone] VO

## 2019-10-04 NOTE — Telephone Encounter (Signed)
Returned call, lm for rtc

## 2019-10-05 NOTE — Telephone Encounter (Signed)
Agree 

## 2019-10-07 ENCOUNTER — Telehealth: Payer: Self-pay | Admitting: Internal Medicine

## 2019-10-07 NOTE — Telephone Encounter (Signed)
Pt has new open areas on both legs, she was to place compression hose after UNA BOOTS removed and did not. She sits in chair all day and when the areas started to drain she just let them drain, it was stuck to her leg and on the floor. Her toenails are appr 3 inches long and she cannot wear shoes and it also makes it difficult for her to walk. She does not bathe her legs and home is in bad condition. Pt refuses office appt, does not ask family to help her either. HHN would like to start UNA boots again Use calcium algionate after cleaning legs Visit 2x week for 2 weeks and then 1x week for 2 weeks Do you agree? She will continue to try to get pt to come in to clinic

## 2019-10-07 NOTE — Telephone Encounter (Signed)
Are the wounds infected? If they are an Hotel manager help.

## 2019-10-07 NOTE — Telephone Encounter (Signed)
Per Beth with [place] [phone] would like a call back

## 2019-10-10 NOTE — Telephone Encounter (Signed)
[facility] calls  [phone] Pt has new areas open but so far no infection noted Pt states she cannot afford supplies  And states [facility] paid for [facility] supplies, HHN states supplies are cheaper from [place]. Pt stated she and her daughter forgot to look at [facility]. HHN has supplied gauze and ointment so far but she cannot do this anymore. Made pt an appt for Thursday 1/28 [facility] at 1315. She was told she must come to clinic appt in order to continue to change orders and discuss how to obtain supplies.

## 2019-10-10 NOTE — Telephone Encounter (Signed)
Detailed Written Order received from [facility] for wound dressing supplies. Placed in PCP's box for review and signature. Hubbard Hartshorn, BSN, RN-BC

## 2019-10-10 NOTE — Telephone Encounter (Signed)
Agree, thank you

## 2019-10-10 NOTE — Telephone Encounter (Signed)
No, they have just reopened. She states pt is not clean but wounds were not infected

## 2019-10-13 ENCOUNTER — Ambulatory Visit: Payer: Medicare Other

## 2019-10-13 ENCOUNTER — Telehealth: Payer: Self-pay | Admitting: Internal Medicine

## 2019-10-13 NOTE — Telephone Encounter (Signed)
Pls beth [phone]

## 2019-10-13 NOTE — Telephone Encounter (Signed)
Lm for rtc 

## 2019-10-13 NOTE — Telephone Encounter (Signed)
Pt did not come to appt, HHN has told pt HH will not provide car until pt sees pcp office, pt has changed appt to 2/2 at 1445

## 2019-10-17 ENCOUNTER — Ambulatory Visit: Payer: Medicare PPO

## 2019-10-18 ENCOUNTER — Ambulatory Visit (INDEPENDENT_AMBULATORY_CARE_PROVIDER_SITE_OTHER): Payer: Medicare PPO | Admitting: Internal Medicine

## 2019-10-18 ENCOUNTER — Encounter: Payer: Self-pay | Admitting: Internal Medicine

## 2019-10-18 ENCOUNTER — Other Ambulatory Visit: Payer: Self-pay

## 2019-10-18 VITALS — BP 129/53 | HR 89 | Temp 98.6°F | Ht <= 58 in

## 2019-10-18 DIAGNOSIS — Z79899 Other long term (current) drug therapy: Secondary | ICD-10-CM | POA: Diagnosis not present

## 2019-10-18 DIAGNOSIS — I872 Venous insufficiency (chronic) (peripheral): Secondary | ICD-10-CM

## 2019-10-18 NOTE — Patient Instructions (Signed)
Hi Ms. Radovic,   I have placed the order for you to have the home health nurse to follow up on your wound. If you begin to have fevers, chills please call use.   Take Care!  Dr. Eileen Stanford  Please call the internal medicine center clinic if you have any questions or concerns, we may be able to help and keep you from a long and expensive emergency room wait. Our clinic and after hours phone number is [phone], the best time to call is Monday through Friday 9 am to 4 pm but there is always someone available 24/7 if you have an emergency. If you need medication refills please notify your pharmacy one week in advance and they will send [place] a request.

## 2019-10-18 NOTE — Progress Notes (Signed)
   CC: F/u Venous stasis, "I need home health renewed."  HPI:  [name] is a 69 y.o. community dwelling woman with medical history listed below presenting and requests for home health wound care renewal.  Please see problem based charting for further details.    Past Medical History:  Diagnosis Date  . Arthritis    lumbar burst fx, osteoporosis  . Back pain 09/29/2013  . Blood dyscrasia   . Chest pain    denies  . Chronic kidney disease    passed stones spontaneously- 40 yrs. ago   . Depression    states her current situation with activity limitations, she has a low spirit at times.   Marland Kitchen Dysrhythmia    occ tachycardia  . Fracture of vertebra, compression ([place])   . GERD (gastroesophageal reflux disease)    occ  . History of kidney stones   . HTN (hypertension)   . ITP (idiopathic thrombocytopenic purpura) 08/29/2013  . Leukocytosis, unspecified 09/05/2013  . Muscle cramp 11/16/2013  . Tachycardia    occ upon exersion, and when put on bp med   Review of Systems:  As per HPI  Physical Exam:  Vitals:   10/18/19 1440  BP: (!) 129/53  Pulse: 89  Temp: 98.6 F (37 C)  TempSrc: Oral  SpO2: 98%  Height: 4\' 5"  (1.346 m)   Physical Exam  Constitutional: She is well-developed, well-nourished, and in no distress.  Cardiovascular: Normal rate, regular rhythm and normal heart sounds.  Pulmonary/Chest: Effort normal and breath sounds normal. She has no wheezes. She has no rales.  Musculoskeletal:        General: No tenderness or edema.     Comments: Bilateral lower extremities wrapped in Ace bandage.  Skin is not warm to touch or tender to palpation.  No evidence of lower extremity pitting edema    Assessment & Plan:   See Encounters Tab for problem based charting.  Patient discussed with Dr. Heber Friesland

## 2019-10-18 NOTE — Assessment & Plan Note (Signed)
Venous stasis dermatitis: She has a longstanding history of venous stasis dermatitis of her bilateral lower extremities and had been following up with home health wound care.  However, the home health agency notified patient that her services will not be renewed until she had seen her PCP.  At the clinic today, she has Ace bandages wrapped around her lower extremities bilaterally which she tells me was recently changed by her home nurse.  She denies fevers, chills or signs of systemic infection.  She does have a history of heart failure with preserved ejection fraction.  On physical exams, she did not have lower extremity pitting edema though I do wonder if excess lower extremity fluid could be playing a role for her chronic venous stasis.  Plan: -Home health wound care renewal ordered -Advised to increase Lasix 80 mg twice daily for 3 days then 80 mg daily afterwards -She might warrant Unna boots placement in the future

## 2019-10-19 ENCOUNTER — Telehealth: Payer: Self-pay | Admitting: Internal Medicine

## 2019-10-19 NOTE — Telephone Encounter (Signed)
Pls contact Beth from [facility] [phone]

## 2019-10-19 NOTE — Telephone Encounter (Signed)
Following [employer] received from [facility]:  Barnet Glasgow, Serena Croissant, Orvis Brill, RN; [place]; [place], [place]; [place], [place], [place]   Deno Lunger  It appears that we still have this pt open to [facility]. I have sent the order and the note from yesterday to the clinical group seeing her.   I will let you know if anything is different. Thanks.   Angie

## 2019-10-19 NOTE — Telephone Encounter (Signed)
RTC, Pt was seen by Dr. Eileen Stanford in [facility] yesterday.  Beth, RN with [facility] is wanting a VO For Unna boot left leg for the area that is open.  She may use Calcium Algionate after cleaning, but will fax a request for this and additional orders. States wound dressing supplies were approved by insurance. VO given for [employer]. Will forward to MD for agreement or denial. SChaplin, RN,BSN

## 2019-10-19 NOTE — Telephone Encounter (Signed)
Thanks for the help.

## 2019-10-23 NOTE — Progress Notes (Signed)
Internal Medicine Clinic Attending  Case discussed with Dr. Agyei at the time of the visit.  We reviewed the resident's history and exam and pertinent patient test results.  I agree with the assessment, diagnosis, and plan of care documented in the resident's note.    

## 2019-10-25 ENCOUNTER — Telehealth: Payer: Self-pay | Admitting: Internal Medicine

## 2019-10-25 NOTE — Telephone Encounter (Signed)
[facility] calls and is upset

## 2019-10-25 NOTE — Telephone Encounter (Signed)
RN calling from [facility] requesting a nurse to call back.

## 2019-10-25 NOTE — Telephone Encounter (Signed)
[facility] states pt told her that md did not look at her legs or feet, she states pt's toenails are at least 2-3 inches long, thick, horrible. She does not know what to do about the legs. This appt was specifically for eval and orders for leg care and podiatry referral.  Pt is not willing to come into clinic usually and was encouraged and told [facility] would be closing the case out without eval and new orders. This has been the subject of calls for appr 2 months. Will send to dr Philipp Ovens and santos-sanchez, please advise

## 2019-10-26 NOTE — Telephone Encounter (Signed)
I did not see patient. Will ask Dr. Eileen Stanford about it. Happy to place podiatry referral without an appt.

## 2019-11-02 ENCOUNTER — Other Ambulatory Visit: Payer: Self-pay | Admitting: Internal Medicine

## 2019-11-02 ENCOUNTER — Telehealth: Payer: Self-pay

## 2019-11-02 NOTE — Telephone Encounter (Signed)
Received TC from [facility] with [place].  States pt's legs (bilateral) have some open areas with some intermittent draining, but it doesn't look infected.  She is requesting a VO for:  bilateral Unna Boots Calcium Alginate for dressings Verbal order given, will forward to PCP for agreement or denial.  Beth also states pt has bilateral LLE and is requesting extra fluid pill.  She states pt is taking furosemide 80mg  once daily.  Beth informed RN will forward request to MD. LOV was 10/18/19 in [facility] by Dr. Eileen Stanford. Thank you, SChaplin, RN,BSN

## 2019-11-02 NOTE — Telephone Encounter (Signed)
Dr. Laurin Coder, [facility] nurse was also requesting a RX for extra fluid pill.  Please send RX if you think this is appropriate. Thanks! Corbin Falck

## 2019-11-02 NOTE — Telephone Encounter (Signed)
Agree 

## 2019-11-05 NOTE — Telephone Encounter (Signed)
Are they able to weigh her?

## 2019-11-17 ENCOUNTER — Telehealth: Payer: Self-pay | Admitting: Internal Medicine

## 2019-11-17 NOTE — Telephone Encounter (Signed)
VO given for [facility] 2x week for 4 weeks 1x week for 3 weeks  For wound care of legs, monitor meds Pt has gained 5 lbs since last visit, pt refuses to weigh herself daily, [facility] gave pt instructions for furosemide of take 80mg  2x daily until weight comes back down and stabilizes Do you agree w/ [facility] VO? Anything more advisable?

## 2019-11-17 NOTE — Telephone Encounter (Signed)
[place] RN Beth requests    New Recertification  VO be given for [facility] as current one expires today .      [place] also reporting weight gain of 5 lbs.  Please call back .

## 2019-11-18 NOTE — Telephone Encounter (Signed)
Agree! Also agree with Lasix instructions. It is difficult for me to provide more specific instructions without seeing her and having daily weights. This is just an unfortunate situation. I understand her concern of contracting COVID if she comes out of the house because she does have underlying pulmonary disease but it has made it difficult for [place] to care for her since most of her issues require in person visits to manage.

## 2019-11-24 ENCOUNTER — Telehealth: Payer: Self-pay | Admitting: Internal Medicine

## 2019-11-24 NOTE — Telephone Encounter (Signed)
RTC to [facility], she ask for CSW consult in home, VO given, do you agree?

## 2019-11-24 NOTE — Telephone Encounter (Signed)
I agree

## 2019-11-24 NOTE — Telephone Encounter (Signed)
Pls contact Arbie Cookey nurse need VO [phone]

## 2019-11-25 ENCOUNTER — Encounter: Payer: Self-pay | Admitting: *Deleted

## 2019-11-25 NOTE — Progress Notes (Unsigned)

## 2019-12-05 NOTE — Progress Notes (Signed)
Hi [name]! I apologized for the delayed response. I was on vacation. When is [name]'s AWV? I am unable to see it in her chart. Was this already completed or do you still need me to fill it out?

## 2019-12-18 NOTE — Progress Notes (Signed)
AWV has not been done.  Please complete the form.

## 2019-12-20 NOTE — Progress Notes (Signed)
Things That May Be Affecting Your Health:  Alcohol  Hearing loss  Pain    Depression  Home Safety  Sexual Health   Diabetes X Lack of physical activity  Stress   Difficulty with daily activities  Loneliness  Tiredness   Drug use  Medicines  Tobacco use  X Falls  Motor Vehicle Safety X Weight   Food choices  Oral Health  Other    YOUR PERSONALIZED HEALTH PLAN : 1. Schedule your next subsequent Medicare Wellness visit in one year 2. Attend all of your regular appointments to address your medical issues 3. Complete the preventative screenings and services   Annual Wellness Visit   Medicare Covered Preventative Screenings and [place] Men and Women Who How Often Need? Date of Last Service Action  Abdominal Aortic Aneurysm Adults with AAA risk factors Once     Alcohol Misuse and Counseling All Adults Screening once a year if no alcohol misuse. Counseling up to 4 face to face sessions.     Bone Density Measurement  Adults at risk for osteoporosis Once every 2 yrs     Lipid Panel Z13.6 All adults without CV disease Once every 5 yrs     Colorectal Cancer  Stool sample or Colonoscopy All adults 66 and older  Once every year Every 10 years Yes    Depression All Adults Once a year  Today   Diabetes Screening Blood glucose, post glucose load, or GTT Z13.1 All adults at risk Pre-diabetics Once per year Twice per year     Diabetes  Self-Management Training All adults Diabetics 10 hrs first year; 2 hours subsequent years. Requires Copay     Glaucoma Diabetics Family history of glaucoma African Americans 1 yrs + Hispanic Americans 28 yrs + Annually - requires coppay     Hepatitis C Z72.89 or F19.20 High Risk for HCV Born between 1945 and 1965 Annually Once     HIV Z11.4 All adults based on risk Annually btw ages 71 & 25 regardless of risk Annually > 65 yrs if at increased risk     Lung Cancer Screening Asymptomatic adults aged 16-77 with 30 pack yr history and  current smoker OR quit within the last 15 yrs Annually Must have counseling and shared decision making documentation before first screen     Medical Nutrition Therapy Adults with  Diabetes Renal disease Kidney transplant within past 3 yrs 3 hours first year; 2 hours subsequent years     Obesity and Counseling All adults Screening once a year Counseling if BMI 30 or higher  Today   Tobacco Use Counseling Adults who use tobacco  Up to 8 visits in one year     Vaccines Z23 Hepatitis B Influenza  Pneumonia  Adults  Once Once every flu season Two different vaccines separated by one year     Next Annual Wellness Visit People with Medicare Every year  Today     Services & Screenings Women Who How Often Need  Date of Last Service Action  Mammogram  Z12.31 Women over 37 One baseline ages 42-39. Annually ager 40 yrs+ Yes    Pap tests All women Annually if high risk. Every 2 yrs for normal risk women     Screening for cervical cancer with  Pap (Z01.419 nl or Z01.411abnl) & HPV Z11.51 Women aged 86 to 84 Once every 5 yrs     Screening pelvic and breast exams All women Annually if high risk. Every 2 yrs  for normal risk women     Sexually Transmitted Diseases Chlamydia Gonorrhea Syphilis All at risk adults Annually for non pregnant females at increased risk         [place] Men Who How Ofter Need  Date of Last Service Action  Prostate Cancer - DRE & PSA Men over 50 Annually.  DRE might require a copay.     Sexually Transmitted Diseases Syphilis All at risk adults Annually for men at increased risk

## 2020-01-06 ENCOUNTER — Other Ambulatory Visit: Payer: Self-pay | Admitting: Internal Medicine

## 2020-01-09 ENCOUNTER — Encounter: Payer: Self-pay | Admitting: *Deleted

## 2020-01-10 ENCOUNTER — Ambulatory Visit (INDEPENDENT_AMBULATORY_CARE_PROVIDER_SITE_OTHER): Payer: Medicare PPO | Admitting: Internal Medicine

## 2020-01-10 ENCOUNTER — Other Ambulatory Visit: Payer: Self-pay

## 2020-01-10 ENCOUNTER — Encounter: Payer: Self-pay | Admitting: Internal Medicine

## 2020-01-10 ENCOUNTER — Telehealth: Payer: Self-pay | Admitting: Internal Medicine

## 2020-01-10 DIAGNOSIS — Z0000 Encounter for general adult medical examination without abnormal findings: Secondary | ICD-10-CM | POA: Diagnosis not present

## 2020-01-10 NOTE — Progress Notes (Addendum)
This AWV is being conducted by [facility] only. The patient was located at home and I was located in [facility]. The patient's identity was confirmed using their DOB and current address. The patient or his/her legal guardian has consented to being evaluated through a telephone encounter and understands the associated risks (an examination cannot be done and the patient may need to come in for an appointment) / benefits (allows the patient to remain at home, decreasing exposure to coronavirus). I personally spent 29 minutes conducting the AWV.  Subjective:   [name] is a 69 y.o. female who presents for a Medicare Annual Wellness Visit.  The following items have been reviewed and updated today in the appropriate area in the EMR.   Health Risk Assessment  Height, weight, BMI, and BP Visual acuity if needed Depression screen Fall risk / safety level Advance directive discussion Medical and family history were reviewed and updated Updating list of other providers & suppliers Medication reconciliation, including over the counter medicines Cognitive screen Written screening schedule Risk Factor list Personalized health advice, risky behaviors, and treatment advice  Social History   Social History Narrative   Current Social History 01/10/2020        Patient lives with daughter and daughter's fiance in a one level home. There are 3 steps with handrail up to the entrance the patient uses.       Patient's method of transportation is personal car. Patient no longer drives. Family member drives.      The highest level of education was high school diploma.      The patient currently retired and disabled.      Identified important Relationships are "My children and grandchildren (4 with one on the way)       Pets : 1 dog, 1 cat, and 2 birds.       Interests / Fun: Watch TV       Current Stressors: "Nothing really stresses me"       Religious / Personal Beliefs: "I believe in  God and Jesus and know I'm going to heaven." ([place])       L. Bernhardt Riemenschneider, BSN, RN-BC             Objective:    Vitals: There were no vitals taken for this visit. Vitals are unable to obtained due to [id number] public health emergency  Activities of Daily Living In your present state of health, do you have any difficulty performing the following activities: 01/10/2020 10/18/2019  Hearing? N N  Vision? N N  Difficulty concentrating or making decisions? N N  Walking or climbing stairs? Y Y  Dressing or bathing? N N  Doing errands, shopping? Y Y  Some recent data might be hidden    Goals Goals     . Exercise 3x per week (10 min per time)     Begin seated and standing exercises with exercise band to help with balance and strengthening         Fall Risk Fall Risk  01/10/2020 10/18/2019 10/20/2018  Falls in the past year? 0 0 0  Risk for fall due to : Impaired mobility Impaired balance/gait -  Risk for fall due to: Comment Back pain - -  Follow up Education provided;Falls prevention discussed Falls prevention discussed -   CDC Handout on Fall Prevention and Handout on Home Exercise Program, Access codes [id number] and [id number] mailed to patient with exercise band.   Depression Screen [facility] 2/9  Scores 01/10/2020 10/18/2019 10/20/2018  PHQ - 2 Score 0 0 0  PHQ- 9 Score 4 - 6    OUD Screen In the past 3 months did you use your opioid medicines for other purposes, for example to help you sleep or to help with stress or worry? Not al all (0 points)     In the past 3 months did opioid medicines cause you to feel slowed down, sluggish, or sedated? Not al all (0 points)     In the past 3 months did opioid medicines cause you to lose interest in your usual activities? Not al all (0 points)     In the past 3 months did you worry about your use of opioid medicines? Not al all (0 points)     A score of 3 or more is concerning for moderate to severe OUD and requires consultation with  a physician.  Patient score = 0  Cognitive Testing Six-Item Cognitive Screener   "I would like to ask you some questions that ask you to use your memory. I am going to name three objects. Please wait until I say all three words, then repeat them. Remember what they are  because I am going to ask you to name them again in a few minutes. Please repeat these words for me: APPLE--TABLE--PENNY." (Interviewer may repeat names 3 times if necessary but repetition not scored.)  Did patient correctly repeat all three words? Yes - may proceed with screen  What year is this? Correct What month is this? Correct What day of the week is this? Correct  What were the three objects I asked you to remember? . Apple Correct . Table  Unable to state . Penny Correct  Score one point for each incorrect answer.  A score of 2 or more points warrants additional investigation.  Patient's score 1    Assessment and Plan:     Patient declined mammogram and colonoscopy at this time Requesting refills that previously were filled by cardiologist (amlodipine, lisinopril, and potassium). Request sent to PCP. Patient will begin seated and standing exercises with exercise band.   During the course of the visit the patient was educated and counseled about appropriate screening and preventive services as documented in the assessment and plan.  The printed AVS was given to the patient and included an updated screening schedule, a list of risk factors, and personalized health advice.        Velora Heckler, RN  01/10/2020    I discussed the AWV findings with the RN who conducted the visit. I was present in the office suite and immediately available to provide assistance and direction throughout the time the service was provided.  Vickki Muff MD PGY-3 Internal Medicine Pager # [phone]

## 2020-01-10 NOTE — Telephone Encounter (Signed)
PT CONTACT [place] AT [facility]

## 2020-01-10 NOTE — Patient Instructions (Addendum)
Annual Wellness Visit   Medicare Covered Preventative Screenings and Services  Services & Screenings Men and Women Who How Often Need? Date of Last Service Action  Abdominal Aortic Aneurysm Adults with AAA risk factors Once     Alcohol Misuse and Counseling All Adults Screening once a year if no alcohol misuse. Counseling up to 4 face to face sessions.     Bone Density Measurement  Adults at risk for osteoporosis Once every 2 yrs     Lipid Panel Z13.6 All adults without CV disease Once every 5 yrs     Colorectal Cancer   Stool sample or  Colonoscopy All adults 107 and older   Once every year  Every 10 years Yes    Depression All Adults Once a year  Today   Diabetes Screening Blood glucose, post glucose load, or GTT Z13.1  All adults at risk  Pre-diabetics  Once per year  Twice per year     Diabetes  Self-Management Training All adults Diabetics 10 hrs first year; 2 hours subsequent years. Requires Copay     Glaucoma  Diabetics  Family history of glaucoma  African Americans 30 yrs +  Hispanic Americans 75 yrs + Annually - requires coppay     Hepatitis C Z72.89 or F19.20  High Risk for HCV  Born between 1945 and 1965  Annually  Once     HIV Z11.4 All adults based on risk  Annually btw ages 21 & 29 regardless of risk  Annually > 65 yrs if at increased risk     Lung Cancer Screening Asymptomatic adults aged 58-77 with 62 pack yr history and current smoker OR quit within the last 15 yrs Annually Must have counseling and shared decision making documentation before first screen     Medical Nutrition Therapy Adults with   Diabetes  Renal disease  Kidney transplant within past 3 yrs 3 hours first year; 2 hours subsequent years     Obesity and Counseling All adults Screening once a year Counseling if BMI 30 or higher  Today   Tobacco Use Counseling Adults who use tobacco  Up to 8 visits in one year     Vaccines Z23  Hepatitis B  Influenza   Pneumonia  Adults     Once  Once every flu season  Two different vaccines separated by one year Yes  Yearly Flu vaccine starting September 1 Due for Prevnar 13 Due for Covid vaccines  Next Annual Wellness Visit People with Medicare Every year  Today     Services & Screenings Women Who How Often Need  Date of Last Service Action  Mammogram  Z12.31 Women over 28 One baseline ages 58-39. Annually ager 40 yrs+ Yes    Pap tests All women Annually if high risk. Every 2 yrs for normal risk women     Screening for cervical cancer with   Pap (Z01.419 nl or Z01.411abnl) &  HPV Z11.51 Women aged 12 to 27 Once every 5 yrs     Screening pelvic and breast exams All women Annually if high risk. Every 2 yrs for normal risk women     Sexually Transmitted Diseases  Chlamydia  Gonorrhea  Syphilis All at risk adults Annually for non pregnant females at increased risk         [place] Men Who How Ofter Need  Date of Last Service Action  Prostate Cancer - DRE & PSA Men over 50 Annually.  DRE might require a copay.  Sexually Transmitted Diseases  Syphilis All at risk adults Annually for men at increased risk         Things That May Be Affecting Your Health:  Alcohol  Hearing loss  Pain    Depression  Home Safety  Sexual Health   Diabetes X Lack of physical activity  Stress   Difficulty with daily activities  Loneliness  Tiredness   Drug use  Medicines  Tobacco use   Falls  Motor Vehicle Safety X Weight   Food choices  Oral Health  Other    YOUR PERSONALIZED HEALTH PLAN : 1. Schedule your next subsequent Medicare Wellness visit in one year 2. Attend all of your regular appointments to address your medical issues 3. Complete the preventative screenings and services 4. Begin seated and standing exercises with exercise band 3 times per week 10 minutes per time. 5. Consider getting mammogram and colonoscopy 6. Consider getting Pneumonia and Covid vaccines     Fall Prevention in  the Home, Adult Falls can cause injuries. They can happen to people of all ages. There are many things you can do to make your home safe and to help prevent falls. Ask for help when making these changes, if needed. What actions can I take to prevent falls? General Instructions  Use good lighting in all rooms. Replace any light bulbs that burn out.  Turn on the lights when you go into a dark area. Use night-lights.  Keep items that you use often in easy-to-reach places. Lower the shelves around your home if necessary.  Set up your furniture so you have a clear path. Avoid moving your furniture around.  Do not have throw rugs and other things on the floor that can make you trip.  Avoid walking on wet floors.  If any of your floors are uneven, fix them.  Add color or contrast paint or tape to clearly mark and help you see: ? Any grab bars or handrails. ? First and last steps of stairways. ? Where the edge of each step is.  If you use a stepladder: ? Make sure that it is fully opened. Do not climb a closed stepladder. ? Make sure that both sides of the stepladder are locked into place. ? Ask someone to hold the stepladder for you while you use it.  If there are any pets around you, be aware of where they are. What can I do in the bathroom?      Keep the floor dry. Clean up any water that spills onto the floor as soon as it happens.  Remove soap buildup in the tub or shower regularly.  Use non-skid mats or decals on the floor of the tub or shower.  Attach bath mats securely with double-sided, non-slip rug tape.  If you need to sit down in the shower, use a plastic, non-slip stool.  Install grab bars by the toilet and in the tub and shower. Do not use towel bars as grab bars. What can I do in the bedroom?  Make sure that you have a light by your bed that is easy to reach.  Do not use any sheets or blankets that are too big for your bed. They should not hang down onto the  floor.  Have a firm chair that has side arms. You can use this for support while you get dressed. What can I do in the kitchen?  Clean up any spills right away.  If you need to reach something above  you, use a strong step stool that has a grab bar.  Keep electrical cords out of the way.  Do not use floor polish or wax that makes floors slippery. If you must use wax, use non-skid floor wax. What can I do with my stairs?  Do not leave any items on the stairs.  Make sure that you have a light switch at the top of the stairs and the bottom of the stairs. If you do not have them, ask someone to add them for you.  Make sure that there are handrails on both sides of the stairs, and use them. Fix handrails that are broken or loose. Make sure that handrails are as long as the stairways.  Install non-slip stair treads on all stairs in your home.  Avoid having throw rugs at the top or bottom of the stairs. If you do have throw rugs, attach them to the floor with carpet tape.  Choose a carpet that does not hide the edge of the steps on the stairway.  Check any carpeting to make sure that it is firmly attached to the stairs. Fix any carpet that is loose or worn. What can I do on the outside of my home?  Use bright outdoor lighting.  Regularly fix the edges of walkways and driveways and fix any cracks.  Remove anything that might make you trip as you walk through a door, such as a raised step or threshold.  Trim any bushes or trees on the path to your home.  Regularly check to see if handrails are loose or broken. Make sure that both sides of any steps have handrails.  Install guardrails along the edges of any raised decks and porches.  Clear walking paths of anything that might make someone trip, such as tools or rocks.  Have any leaves, snow, or ice cleared regularly.  Use sand or salt on walking paths during winter.  Clean up any spills in your garage right away. This includes  grease or oil spills. What other actions can I take?  Wear shoes that: ? Have a low heel. Do not wear high heels. ? Have rubber bottoms. ? Are comfortable and fit you well. ? Are closed at the toe. Do not wear open-toe sandals.  Use tools that help you move around (mobility aids) if they are needed. These include: ? Canes. ? Walkers. ? Scooters. ? Crutches.  Review your medicines with your doctor. Some medicines can make you feel dizzy. This can increase your chance of falling. Ask your doctor what other things you can do to help prevent falls. Where to find more information  Centers for Disease Control and Prevention, STEADI: https://garcia.biz/  [employer] on Aging: BrainJudge.co.uk Contact a doctor if:  You are afraid of falling at home.  You feel weak, drowsy, or dizzy at home.  You fall at home. Summary  There are many simple things that you can do to make your home safe and to help prevent falls.  Ways to make your home safe include removing tripping hazards and installing grab bars in the bathroom.  Ask for help when making these changes in your home. This information is not intended to replace advice given to you by your health care provider. Make sure you discuss any questions you have with your health care provider. Document Revised: 12/23/2018 Document Reviewed: 04/16/2017 Elsevier Patient Education  2020 [place] Maintenance, Female Adopting a healthy lifestyle and getting preventive care are important in  promoting health and wellness. Ask your health care provider about:  The right schedule for you to have regular tests and exams.  Things you can do on your own to prevent diseases and keep yourself healthy. What should I know about diet, weight, and exercise? Eat a healthy diet   Eat a diet that includes plenty of vegetables, fruits, low-fat dairy products, and lean protein.  Do not eat a lot of foods that are high in  solid fats, added sugars, or sodium. Maintain a healthy weight Body mass index (BMI) is used to identify weight problems. It estimates body fat based on height and weight. Your health care provider can help determine your BMI and help you achieve or maintain a healthy weight. Get regular exercise Get regular exercise. This is one of the most important things you can do for your health. Most adults should:  Exercise for at least 150 minutes each week. The exercise should increase your heart rate and make you sweat (moderate-intensity exercise).  Do strengthening exercises at least twice a week. This is in addition to the moderate-intensity exercise.  Spend less time sitting. Even light physical activity can be beneficial. Watch cholesterol and blood lipids Have your blood tested for lipids and cholesterol at 67 years of age, then have this test every 5 years. Have your cholesterol levels checked more often if:  Your lipid or cholesterol levels are high.  You are older than 67 years of age.  You are at high risk for heart disease. What should I know about cancer screening? Depending on your health history and family history, you may need to have cancer screening at various ages. This may include screening for:  Breast cancer.  Cervical cancer.  Colorectal cancer.  Skin cancer.  Lung cancer. What should I know about heart disease, diabetes, and high blood pressure? Blood pressure and heart disease  High blood pressure causes heart disease and increases the risk of stroke. This is more likely to develop in people who have high blood pressure readings, are of African descent, or are overweight.  Have your blood pressure checked: ? Every 3-5 years if you are 45-104 years of age. ? Every year if you are 96 years old or older. Diabetes Have regular diabetes screenings. This checks your fasting blood sugar level. Have the screening done:  Once every three years after age 60 if you  are at a normal weight and have a low risk for diabetes.  More often and at a younger age if you are overweight or have a high risk for diabetes. What should I know about preventing infection? Hepatitis B If you have a higher risk for hepatitis B, you should be screened for this virus. Talk with your health care provider to find out if you are at risk for hepatitis B infection. Hepatitis C Testing is recommended for:  Everyone born from [birth year] through 1963.  Anyone with known risk factors for hepatitis C. Sexually transmitted infections (STIs)  Get screened for STIs, including gonorrhea and chlamydia, if: ? You are sexually active and are younger than 67 years of age. ? You are older than 68 years of age and your health care provider tells you that you are at risk for this type of infection. ? Your sexual activity has changed since you were last screened, and you are at increased risk for chlamydia or gonorrhea. Ask your health care provider if you are at risk.  Ask your health care provider about whether you  are at high risk for HIV. Your health care provider may recommend a prescription medicine to help prevent HIV infection. If you choose to take medicine to prevent HIV, you should first get tested for HIV. You should then be tested every 3 months for as long as you are taking the medicine. Pregnancy  If you are about to stop having your period (premenopausal) and you may become pregnant, seek counseling before you get pregnant.  Take 400 to 800 micrograms (mcg) of folic acid every day if you become pregnant.  Ask for birth control (contraception) if you want to prevent pregnancy. Osteoporosis and menopause Osteoporosis is a disease in which the bones lose minerals and strength with aging. This can result in bone fractures. If you are 19 years old or older, or if you are at risk for osteoporosis and fractures, ask your health care provider if you should:  Be screened for bone  loss.  Take a calcium or vitamin D supplement to lower your risk of fractures.  Be given hormone replacement therapy (HRT) to treat symptoms of menopause. Follow these instructions at home: Lifestyle  Do not use any products that contain nicotine or tobacco, such as cigarettes, e-cigarettes, and chewing tobacco. If you need help quitting, ask your health care provider.  Do not use street drugs.  Do not share needles.  Ask your health care provider for help if you need support or information about quitting drugs. Alcohol use  Do not drink alcohol if: ? Your health care provider tells you not to drink. ? You are pregnant, may be pregnant, or are planning to become pregnant.  If you drink alcohol: ? Limit how much you use to 0-1 drink a day. ? Limit intake if you are breastfeeding.  Be aware of how much alcohol is in your drink. In the [place] one drink equals one 12 oz bottle of beer (355 mL), one 5 oz glass of wine (148 mL), or one 1 oz glass of hard liquor (44 mL). General instructions  Schedule regular health, dental, and eye exams.  Stay current with your vaccines.  Tell your health care provider if: ? You often feel depressed. ? You have ever been abused or do not feel safe at home. Summary  Adopting a healthy lifestyle and getting preventive care are important in promoting health and wellness.  Follow your health care provider's instructions about healthy diet, exercising, and getting tested or screened for diseases.  Follow your health care provider's instructions on monitoring your cholesterol and blood pressure. This information is not intended to replace advice given to you by your health care provider. Make sure you discuss any questions you have with your health care provider. Document Revised: 08/25/2018 Document Reviewed: 08/25/2018 Elsevier Patient Education  2020 [employer].

## 2020-01-10 NOTE — Telephone Encounter (Signed)
Agree 

## 2020-01-10 NOTE — Telephone Encounter (Signed)
Return call to [place], [facility] - stated they are wrapping pt's legs d/t edema. Requesting verbal order "To continue weekly nurse visits". VO given - if not appropriate, let me know Thanks

## 2020-01-11 NOTE — Progress Notes (Signed)
Internal Medicine Clinic Attending  Case discussed with Dr. Shan Levans at the time of the visit.  We reviewed the AWV findings.  I agree with the assessment, diagnosis, and plan of care documented in the AWV note.

## 2020-01-17 ENCOUNTER — Telehealth: Payer: Self-pay

## 2020-01-17 DIAGNOSIS — R269 Unspecified abnormalities of gait and mobility: Secondary | ICD-10-CM | POA: Diagnosis not present

## 2020-01-17 DIAGNOSIS — I5033 Acute on chronic diastolic (congestive) heart failure: Secondary | ICD-10-CM | POA: Diagnosis not present

## 2020-01-17 DIAGNOSIS — J9611 Chronic respiratory failure with hypoxia: Secondary | ICD-10-CM | POA: Diagnosis not present

## 2020-01-17 DIAGNOSIS — E662 Morbid (severe) obesity with alveolar hypoventilation: Secondary | ICD-10-CM | POA: Diagnosis not present

## 2020-01-17 NOTE — Telephone Encounter (Signed)
[name] with [facility] requesting VO for skilled nursing, please call back.

## 2020-01-17 NOTE — Telephone Encounter (Signed)
Returned call to [facility]. Spoke with [employer]. Verbal auth given for [facility] RN 1 week 4 for wound care. Will route to PCP for agreement/denial. Hubbard Hartshorn, RN, BSN

## 2020-01-19 DIAGNOSIS — L98499 Non-pressure chronic ulcer of skin of other sites with unspecified severity: Secondary | ICD-10-CM | POA: Diagnosis not present

## 2020-01-23 NOTE — Telephone Encounter (Signed)
Agree 

## 2020-02-16 ENCOUNTER — Telehealth: Payer: Self-pay | Admitting: Internal Medicine

## 2020-02-16 NOTE — Telephone Encounter (Signed)
[name] from [facility] needs VO [phone]

## 2020-02-16 NOTE — Telephone Encounter (Signed)
Returned call to [place], Therapist, sports with [employer]. Verbal auth given to extend [facility] skilled nursing 1 week 4 for UNNA boots. Will route to PCP for agreement/denial. Hubbard Hartshorn, RN, BSN

## 2020-02-17 DIAGNOSIS — E662 Morbid (severe) obesity with alveolar hypoventilation: Secondary | ICD-10-CM | POA: Diagnosis not present

## 2020-02-17 DIAGNOSIS — R269 Unspecified abnormalities of gait and mobility: Secondary | ICD-10-CM | POA: Diagnosis not present

## 2020-02-17 DIAGNOSIS — J9611 Chronic respiratory failure with hypoxia: Secondary | ICD-10-CM | POA: Diagnosis not present

## 2020-02-17 DIAGNOSIS — I5033 Acute on chronic diastolic (congestive) heart failure: Secondary | ICD-10-CM | POA: Diagnosis not present

## 2020-02-18 NOTE — Telephone Encounter (Signed)
Agree 

## 2020-03-02 ENCOUNTER — Other Ambulatory Visit: Payer: Self-pay | Admitting: Internal Medicine

## 2020-03-06 NOTE — Telephone Encounter (Signed)
Filled prescription for 2 months. Need appt for further refills. Has not seen me in over a year.

## 2020-03-18 DIAGNOSIS — E662 Morbid (severe) obesity with alveolar hypoventilation: Secondary | ICD-10-CM | POA: Diagnosis not present

## 2020-03-18 DIAGNOSIS — R269 Unspecified abnormalities of gait and mobility: Secondary | ICD-10-CM | POA: Diagnosis not present

## 2020-03-18 DIAGNOSIS — J9611 Chronic respiratory failure with hypoxia: Secondary | ICD-10-CM | POA: Diagnosis not present

## 2020-03-18 DIAGNOSIS — I5033 Acute on chronic diastolic (congestive) heart failure: Secondary | ICD-10-CM | POA: Diagnosis not present

## 2020-03-28 ENCOUNTER — Other Ambulatory Visit: Payer: Self-pay | Admitting: Internal Medicine

## 2020-04-09 ENCOUNTER — Other Ambulatory Visit: Payer: Self-pay | Admitting: Internal Medicine

## 2020-04-11 ENCOUNTER — Encounter: Payer: Self-pay | Admitting: Student

## 2020-04-11 ENCOUNTER — Ambulatory Visit (INDEPENDENT_AMBULATORY_CARE_PROVIDER_SITE_OTHER): Payer: Medicare PPO | Admitting: Student

## 2020-04-11 ENCOUNTER — Other Ambulatory Visit: Payer: Self-pay

## 2020-04-11 DIAGNOSIS — I872 Venous insufficiency (chronic) (peripheral): Secondary | ICD-10-CM

## 2020-04-11 DIAGNOSIS — K5903 Drug induced constipation: Secondary | ICD-10-CM | POA: Diagnosis not present

## 2020-04-11 DIAGNOSIS — M549 Dorsalgia, unspecified: Secondary | ICD-10-CM

## 2020-04-11 DIAGNOSIS — I83009 Varicose veins of unspecified lower extremity with ulcer of unspecified site: Secondary | ICD-10-CM

## 2020-04-11 DIAGNOSIS — Z0000 Encounter for general adult medical examination without abnormal findings: Secondary | ICD-10-CM | POA: Diagnosis not present

## 2020-04-11 DIAGNOSIS — I5032 Chronic diastolic (congestive) heart failure: Secondary | ICD-10-CM

## 2020-04-11 DIAGNOSIS — D693 Immune thrombocytopenic purpura: Secondary | ICD-10-CM

## 2020-04-11 DIAGNOSIS — L97909 Non-pressure chronic ulcer of unspecified part of unspecified lower leg with unspecified severity: Secondary | ICD-10-CM

## 2020-04-11 DIAGNOSIS — S81809D Unspecified open wound, unspecified lower leg, subsequent encounter: Secondary | ICD-10-CM

## 2020-04-11 DIAGNOSIS — G8929 Other chronic pain: Secondary | ICD-10-CM

## 2020-04-11 MED ORDER — BACLOFEN 5 MG PO TABS: ORAL_TABLET | ORAL | 1 refills | Status: DC

## 2020-04-11 NOTE — Assessment & Plan Note (Addendum)
A: Patient endorses spasms associated with her back pain. She continues to take Percocet 10-325 TID PRN for pain and has been prescribed Tizanidine 2mg  q 6 hrs PRN for spasms by her back surgeon, Dr. Ellene Route. She states these haven't improved her spasms and requests a new medication.   P: discontinue Tizanidine  - Prescribed Baclofen 5mg  TID for 3 days followed by 10mg  TID thereafter if 5mg  insufficienct - Patient instructed to call if symptoms do not improve on 10mg  TID - may need further increase in dosage.

## 2020-04-11 NOTE — Assessment & Plan Note (Signed)
See wound of lower extremity A/P.

## 2020-04-11 NOTE — Assessment & Plan Note (Signed)
A: Patient endorses mild constipation associated with chronic percocet use for her back pain. She says she had a bowel movement within the past couple days.   P: Encouraged patient to use her Miralax and Senna regularly.

## 2020-04-11 NOTE — Assessment & Plan Note (Signed)
A: Last ECHO 10/01/18 s/p exacerbation showed EF 60-65% with moderate LVH, mildly dilated RV with mild-moderately reduced systolic function with mild pulmonary HTN. Patient denies worsening dyspnea or orthopnea on 2L O2 24/7. She denies pitting edema. She is not weighing herself as her scale is broken. Leg swelling today more consistent with chronic venous stasis than CHF exacerbation.  P: Continue Lasix 80mg  BID.

## 2020-04-11 NOTE — Assessment & Plan Note (Signed)
A: Patient notes that she has had mild nosebleeds and is on 2L O2 24/7. She states her nosebleeds are self-limited and she denies any other bleeding.   P: Instructed patient to call her home health team who manages her oxygen to ask for an oxygen humidifier.  - Patient told she may use OTC nasal saline spray in the meantime.

## 2020-04-11 NOTE — Assessment & Plan Note (Signed)
A: Patient is due for mammogram and colonoscopy as well as COVID, pneumonia, and TDap vaccines.  P: Patient declined referrals for these today. Will follow up at future visit.

## 2020-04-11 NOTE — Progress Notes (Signed)
[facility] Internal Medicine Residency Telephone Encounter Continuity Care Appointment  HPI:   This telephone encounter was created for [name] on 04/11/2020 for the following purpose/cc: leg swelling.  [name] is an obese female with a history of HFpEF (60-65%), chronic venous stasis with ulcers, and chronic ITP with continued leg swelling. She states that she previously had home health for wound care twice weekly followed by once weekly and states that she has not had a nurse come to her home in 3 weeks. She says she still has bandages in place covering her ulcers. She is unsure if her swelling has worsened recently because she is unable to look at her lower legs due to chronic back pain. She states she is not wearing compression stockings. She gets up for frequent urination but says that she is unable to get into a car or leave the house. She endorses shortness of breath on exertion that has not worsened from baseline. She is on 2L O2 24/7. She sleeps sitting up in a recliner. She endorses muscle spasms and states that tizanidine is not helping. She also endorses constipation on Percocet. She denies any fevers, chills, nausea, vomiting, or chest pain (aside from muscle spasms).    Past Medical History:  Past Medical History:  Diagnosis Date  . Arthritis    lumbar burst fx, osteoporosis  . Back pain 09/29/2013  . Blood dyscrasia   . Chest pain    denies  . Chronic kidney disease    passed stones spontaneously- 40 yrs. ago   . Depression    states her current situation with activity limitations, she has a low spirit at times.   Marland Kitchen Dysrhythmia    occ tachycardia  . Fracture of vertebra, compression ([place])   . GERD (gastroesophageal reflux disease)    occ  . History of kidney stones   . HTN (hypertension)   . ITP (idiopathic thrombocytopenic purpura) 08/29/2013  . Leukocytosis, unspecified 09/05/2013  . Muscle cramp 11/16/2013  . Tachycardia    occ upon exersion, and  when put on bp med      PSHx: Patient states she lives at home with her son and daughter in law but notes they are not willing to help take care of her. She does not drive. She quit smoking cigarettes 4-5 years ago (15 pack year history) and rarely drinks alcohol and does not use other drugs.  Allergies: NKDA   ROS:   Positive for nosebleeds on chronic O2. All others negative except as noted above in HPI.   Assessment / Plan / Recommendations:   Please see A&P under problem oriented charting for assessment of the patient's acute and chronic medical conditions.   As always, pt is advised that if symptoms worsen or new symptoms arise, they should go to an urgent care facility or to to ER for further evaluation.   Consent and Medical Decision Making:   Patient seen with Dr. Evette Doffing  This is a telephone encounter between [place] on 04/11/2020 for leg swelling. The visit was conducted with the patient located at home and [name] at [facility]. The patient's identity was confirmed using their DOB and current address. The patient has consented to being evaluated through a telephone encounter and understands the associated risks (an examination cannot be done and the patient may need to come in for an appointment) / benefits (allows the patient to remain at home, decreasing exposure to coronavirus). I personally spent  25 minutes on medical discussion.

## 2020-04-11 NOTE — Assessment & Plan Note (Addendum)
A: Patient has chronic venous insufficiency with ulcers. I was not able to view these given telehealth encounter today, but patient states she was previously being seen at least weekly by a home health wound care nurse; however, nursing hasn't been to her home in 3 weeks. She has had the same dressing in place over this time period covering her wounds, which she cannot see. Denies fevers and chills. She has had care with [facility] in the past.  P: Sent referral for home health wound care to visit patient.  - Discussed with patient that home health is not a permanent solution for her chronic ulcers - counseled patient to begin looking for alternative methods of getting to wound care clinic in person for her care; however, she notes her son and daughter in law would likely not be willing to help with her care and says she cannot get into a car given her current swelling. Will need to discuss further at future visit.  - Encouraged she continue to move about as tolerated.

## 2020-04-12 NOTE — Addendum Note (Signed)
Addended by: Lalla Brothers T on: 04/12/2020 03:00 PM   Modules accepted: Level of Service

## 2020-04-12 NOTE — Progress Notes (Signed)
Internal Medicine Clinic Attending  I was present for this telephone visit.  I personally confirmed the key portions of the history documented by Dr. Konrad Penta and I reviewed pertinent patient test results.  The assessment, diagnosis, and plan were formulated together and I agree with the documentation in the resident's note.

## 2020-04-18 ENCOUNTER — Emergency Department (HOSPITAL_COMMUNITY): Payer: Medicare PPO

## 2020-04-18 ENCOUNTER — Inpatient Hospital Stay (HOSPITAL_COMMUNITY)
Admission: EM | Admit: 2020-04-18 | Discharge: 2020-04-30 | DRG: 871 | Disposition: A | Discharge status: Skilled Nursing Facility | Payer: Medicare PPO | Attending: Internal Medicine | Admitting: Internal Medicine

## 2020-04-18 ENCOUNTER — Encounter (HOSPITAL_COMMUNITY): Payer: Self-pay

## 2020-04-18 ENCOUNTER — Other Ambulatory Visit: Payer: Self-pay

## 2020-04-18 DIAGNOSIS — R4182 Altered mental status, unspecified: Secondary | ICD-10-CM

## 2020-04-18 DIAGNOSIS — A419 Sepsis, unspecified organism: Secondary | ICD-10-CM | POA: Diagnosis not present

## 2020-04-18 DIAGNOSIS — Z90710 Acquired absence of both cervix and uterus: Secondary | ICD-10-CM

## 2020-04-18 DIAGNOSIS — L89601 Pressure ulcer of unspecified heel, stage 1: Secondary | ICD-10-CM | POA: Diagnosis present

## 2020-04-18 DIAGNOSIS — J9690 Respiratory failure, unspecified, unspecified whether with hypoxia or hypercapnia: Secondary | ICD-10-CM | POA: Diagnosis not present

## 2020-04-18 DIAGNOSIS — R609 Edema, unspecified: Secondary | ICD-10-CM | POA: Diagnosis not present

## 2020-04-18 DIAGNOSIS — T402X5A Adverse effect of other opioids, initial encounter: Secondary | ICD-10-CM | POA: Diagnosis present

## 2020-04-18 DIAGNOSIS — K5903 Drug induced constipation: Secondary | ICD-10-CM | POA: Diagnosis present

## 2020-04-18 DIAGNOSIS — E871 Hypo-osmolality and hyponatremia: Secondary | ICD-10-CM | POA: Diagnosis present

## 2020-04-18 DIAGNOSIS — J9621 Acute and chronic respiratory failure with hypoxia: Secondary | ICD-10-CM | POA: Diagnosis not present

## 2020-04-18 DIAGNOSIS — I83009 Varicose veins of unspecified lower extremity with ulcer of unspecified site: Secondary | ICD-10-CM | POA: Diagnosis present

## 2020-04-18 DIAGNOSIS — I878 Other specified disorders of veins: Secondary | ICD-10-CM | POA: Diagnosis present

## 2020-04-18 DIAGNOSIS — Z9981 Dependence on supplemental oxygen: Secondary | ICD-10-CM

## 2020-04-18 DIAGNOSIS — E662 Morbid (severe) obesity with alveolar hypoventilation: Secondary | ICD-10-CM | POA: Diagnosis present

## 2020-04-18 DIAGNOSIS — I5030 Unspecified diastolic (congestive) heart failure: Secondary | ICD-10-CM | POA: Diagnosis not present

## 2020-04-18 DIAGNOSIS — L03115 Cellulitis of right lower limb: Secondary | ICD-10-CM

## 2020-04-18 DIAGNOSIS — J9612 Chronic respiratory failure with hypercapnia: Secondary | ICD-10-CM | POA: Diagnosis not present

## 2020-04-18 DIAGNOSIS — E872 Acidosis: Secondary | ICD-10-CM | POA: Diagnosis present

## 2020-04-18 DIAGNOSIS — I5032 Chronic diastolic (congestive) heart failure: Secondary | ICD-10-CM | POA: Diagnosis present

## 2020-04-18 DIAGNOSIS — I272 Pulmonary hypertension, unspecified: Secondary | ICD-10-CM | POA: Diagnosis present

## 2020-04-18 DIAGNOSIS — L0390 Cellulitis, unspecified: Secondary | ICD-10-CM | POA: Diagnosis not present

## 2020-04-18 DIAGNOSIS — E860 Dehydration: Secondary | ICD-10-CM | POA: Diagnosis present

## 2020-04-18 DIAGNOSIS — K219 Gastro-esophageal reflux disease without esophagitis: Secondary | ICD-10-CM | POA: Diagnosis present

## 2020-04-18 DIAGNOSIS — R6520 Severe sepsis without septic shock: Secondary | ICD-10-CM

## 2020-04-18 DIAGNOSIS — M5489 Other dorsalgia: Secondary | ICD-10-CM | POA: Diagnosis not present

## 2020-04-18 DIAGNOSIS — I517 Cardiomegaly: Secondary | ICD-10-CM | POA: Diagnosis not present

## 2020-04-18 DIAGNOSIS — R0902 Hypoxemia: Secondary | ICD-10-CM | POA: Diagnosis not present

## 2020-04-18 DIAGNOSIS — J9602 Acute respiratory failure with hypercapnia: Secondary | ICD-10-CM | POA: Diagnosis not present

## 2020-04-18 DIAGNOSIS — M2550 Pain in unspecified joint: Secondary | ICD-10-CM | POA: Diagnosis not present

## 2020-04-18 DIAGNOSIS — G8929 Other chronic pain: Secondary | ICD-10-CM | POA: Diagnosis present

## 2020-04-18 DIAGNOSIS — J449 Chronic obstructive pulmonary disease, unspecified: Secondary | ICD-10-CM | POA: Diagnosis present

## 2020-04-18 DIAGNOSIS — M549 Dorsalgia, unspecified: Secondary | ICD-10-CM | POA: Diagnosis present

## 2020-04-18 DIAGNOSIS — K760 Fatty (change of) liver, not elsewhere classified: Secondary | ICD-10-CM | POA: Diagnosis not present

## 2020-04-18 DIAGNOSIS — Z6841 Body Mass Index (BMI) 40.0 and over, adult: Secondary | ICD-10-CM

## 2020-04-18 DIAGNOSIS — B9689 Other specified bacterial agents as the cause of diseases classified elsewhere: Secondary | ICD-10-CM | POA: Diagnosis not present

## 2020-04-18 DIAGNOSIS — L03116 Cellulitis of left lower limb: Secondary | ICD-10-CM

## 2020-04-18 DIAGNOSIS — Z743 Need for continuous supervision: Secondary | ICD-10-CM | POA: Diagnosis not present

## 2020-04-18 DIAGNOSIS — J9611 Chronic respiratory failure with hypoxia: Secondary | ICD-10-CM | POA: Diagnosis not present

## 2020-04-18 DIAGNOSIS — R7989 Other specified abnormal findings of blood chemistry: Secondary | ICD-10-CM

## 2020-04-18 DIAGNOSIS — Z20822 Contact with and (suspected) exposure to covid-19: Secondary | ICD-10-CM | POA: Diagnosis not present

## 2020-04-18 DIAGNOSIS — Z7401 Bed confinement status: Secondary | ICD-10-CM | POA: Diagnosis not present

## 2020-04-18 DIAGNOSIS — D693 Immune thrombocytopenic purpura: Secondary | ICD-10-CM | POA: Diagnosis present

## 2020-04-18 DIAGNOSIS — I10 Essential (primary) hypertension: Secondary | ICD-10-CM | POA: Diagnosis not present

## 2020-04-18 DIAGNOSIS — S81809A Unspecified open wound, unspecified lower leg, initial encounter: Secondary | ICD-10-CM | POA: Diagnosis present

## 2020-04-18 DIAGNOSIS — Z79891 Long term (current) use of opiate analgesic: Secondary | ICD-10-CM

## 2020-04-18 DIAGNOSIS — F40240 Claustrophobia: Secondary | ICD-10-CM | POA: Diagnosis present

## 2020-04-18 DIAGNOSIS — J9811 Atelectasis: Secondary | ICD-10-CM | POA: Diagnosis not present

## 2020-04-18 DIAGNOSIS — J9600 Acute respiratory failure, unspecified whether with hypoxia or hypercapnia: Secondary | ICD-10-CM | POA: Diagnosis not present

## 2020-04-18 DIAGNOSIS — L8990 Pressure ulcer of unspecified site, unspecified stage: Secondary | ICD-10-CM | POA: Insufficient documentation

## 2020-04-18 DIAGNOSIS — R269 Unspecified abnormalities of gait and mobility: Secondary | ICD-10-CM | POA: Diagnosis not present

## 2020-04-18 DIAGNOSIS — I361 Nonrheumatic tricuspid (valve) insufficiency: Secondary | ICD-10-CM | POA: Diagnosis not present

## 2020-04-18 DIAGNOSIS — I5033 Acute on chronic diastolic (congestive) heart failure: Secondary | ICD-10-CM | POA: Diagnosis not present

## 2020-04-18 DIAGNOSIS — J9601 Acute respiratory failure with hypoxia: Secondary | ICD-10-CM | POA: Diagnosis not present

## 2020-04-18 DIAGNOSIS — R110 Nausea: Secondary | ICD-10-CM | POA: Diagnosis not present

## 2020-04-18 DIAGNOSIS — R0602 Shortness of breath: Secondary | ICD-10-CM | POA: Diagnosis present

## 2020-04-18 DIAGNOSIS — R000 Tachycardia, unspecified: Secondary | ICD-10-CM | POA: Diagnosis not present

## 2020-04-18 DIAGNOSIS — Z87891 Personal history of nicotine dependence: Secondary | ICD-10-CM

## 2020-04-18 DIAGNOSIS — R600 Localized edema: Secondary | ICD-10-CM | POA: Diagnosis not present

## 2020-04-18 DIAGNOSIS — I959 Hypotension, unspecified: Secondary | ICD-10-CM | POA: Diagnosis not present

## 2020-04-18 DIAGNOSIS — L89891 Pressure ulcer of other site, stage 1: Secondary | ICD-10-CM | POA: Diagnosis not present

## 2020-04-18 DIAGNOSIS — I509 Heart failure, unspecified: Secondary | ICD-10-CM | POA: Diagnosis not present

## 2020-04-18 DIAGNOSIS — K8020 Calculus of gallbladder without cholecystitis without obstruction: Secondary | ICD-10-CM | POA: Diagnosis present

## 2020-04-18 DIAGNOSIS — I890 Lymphedema, not elsewhere classified: Secondary | ICD-10-CM | POA: Diagnosis present

## 2020-04-18 DIAGNOSIS — Z87442 Personal history of urinary calculi: Secondary | ICD-10-CM

## 2020-04-18 DIAGNOSIS — J811 Chronic pulmonary edema: Secondary | ICD-10-CM | POA: Diagnosis not present

## 2020-04-18 DIAGNOSIS — Z79899 Other long term (current) drug therapy: Secondary | ICD-10-CM

## 2020-04-18 DIAGNOSIS — Z8249 Family history of ischemic heart disease and other diseases of the circulatory system: Secondary | ICD-10-CM

## 2020-04-18 DIAGNOSIS — J9622 Acute and chronic respiratory failure with hypercapnia: Secondary | ICD-10-CM | POA: Diagnosis present

## 2020-04-18 DIAGNOSIS — F329 Major depressive disorder, single episode, unspecified: Secondary | ICD-10-CM | POA: Diagnosis present

## 2020-04-18 DIAGNOSIS — I110 Hypertensive heart disease with heart failure: Secondary | ICD-10-CM | POA: Diagnosis not present

## 2020-04-18 DIAGNOSIS — M62838 Other muscle spasm: Secondary | ICD-10-CM | POA: Diagnosis not present

## 2020-04-18 LAB — I-STAT ARTERIAL BLOOD GAS, ED
Acid-Base Excess: 14 mmol/L — ABNORMAL HIGH (ref 0.0–2.0)
Bicarbonate: 42.3 mmol/L — ABNORMAL HIGH (ref 20.0–28.0)
Calcium, Ion: 1.09 mmol/L — ABNORMAL LOW (ref 1.15–1.40)
HCT: 38 % (ref 36.0–46.0)
Hemoglobin: 12.9 g/dL (ref 12.0–15.0)
O2 Saturation: 83 %
Patient temperature: 102.2
Potassium: 3.8 mmol/L (ref 3.5–5.1)
Sodium: 136 mmol/L (ref 135–145)
TCO2: 44 mmol/L — ABNORMAL HIGH (ref 22–32)
pCO2 arterial: 74.1 mmHg (ref 32.0–48.0)
pH, Arterial: 7.373 (ref 7.350–7.450)
pO2, Arterial: 58 mmHg — ABNORMAL LOW (ref 83.0–108.0)

## 2020-04-18 LAB — COMPREHENSIVE METABOLIC PANEL
ALT: 54 U/L — ABNORMAL HIGH (ref 0–44)
AST: 55 U/L — ABNORMAL HIGH (ref 15–41)
Albumin: 4 g/dL (ref 3.5–5.0)
Alkaline Phosphatase: 127 U/L — ABNORMAL HIGH (ref 38–126)
Anion gap: 10 (ref 5–15)
BUN: 18 mg/dL (ref 8–23)
CO2: 37 mmol/L — ABNORMAL HIGH (ref 22–32)
Calcium: 8.6 mg/dL — ABNORMAL LOW (ref 8.9–10.3)
Chloride: 84 mmol/L — ABNORMAL LOW (ref 98–111)
Creatinine, Ser: 0.73 mg/dL (ref 0.44–1.00)
GFR calc Af Amer: >60 mL/min (ref >60)
GFR calc non Af Amer: >60 mL/min (ref >60)
Glucose, Bld: 148 mg/dL — ABNORMAL HIGH (ref 70–99)
Potassium: 3.7 mmol/L (ref 3.5–5.1)
Sodium: 131 mmol/L — ABNORMAL LOW (ref 135–145)
Total Bilirubin: 2 mg/dL — ABNORMAL HIGH (ref 0.3–1.2)
Total Protein: 6.7 g/dL (ref 6.5–8.1)

## 2020-04-18 LAB — CBC WITH DIFFERENTIAL/PLATELET
Abs Immature Granulocytes: 0.19 10*3/uL — ABNORMAL HIGH (ref 0.00–0.07)
Basophils Absolute: 0.1 10*3/uL (ref 0.0–0.1)
Basophils Relative: 0 %
Eosinophils Absolute: 0.1 10*3/uL (ref 0.0–0.5)
Eosinophils Relative: 1 %
HCT: 44.6 % (ref 36.0–46.0)
Hemoglobin: 13.5 g/dL (ref 12.0–15.0)
Immature Granulocytes: 1 %
Lymphocytes Relative: 2 %
Lymphs Abs: 0.4 10*3/uL — ABNORMAL LOW (ref 0.7–4.0)
MCH: 27.9 pg (ref 26.0–34.0)
MCHC: 30.3 g/dL (ref 30.0–36.0)
MCV: 92.1 fL (ref 80.0–100.0)
Monocytes Absolute: 0.9 10*3/uL (ref 0.1–1.0)
Monocytes Relative: 4 %
Neutro Abs: 20.1 10*3/uL — ABNORMAL HIGH (ref 1.7–7.7)
Neutrophils Relative %: 92 %
Platelets: 59 10*3/uL — ABNORMAL LOW (ref 150–400)
RBC: 4.84 MIL/uL (ref 3.87–5.11)
RDW: 15.2 % (ref 11.5–15.5)
WBC: 21.8 10*3/uL — ABNORMAL HIGH (ref 4.0–10.5)
nRBC: 0 % (ref 0.0–0.2)

## 2020-04-18 LAB — URINALYSIS, ROUTINE W REFLEX MICROSCOPIC
Bilirubin Urine: NEGATIVE
Glucose, UA: NEGATIVE mg/dL
Ketones, ur: NEGATIVE mg/dL
Nitrite: NEGATIVE
Protein, ur: NEGATIVE mg/dL
Specific Gravity, Urine: 1.012 (ref 1.005–1.030)
pH: 8 (ref 5.0–8.0)

## 2020-04-18 LAB — LACTIC ACID, PLASMA: Lactic Acid, Venous: 1.2 mmol/L (ref 0.5–1.9)

## 2020-04-18 LAB — SARS CORONAVIRUS 2 BY RT PCR (HOSPITAL ORDER, PERFORMED IN ~~LOC~~ HOSPITAL LAB): SARS Coronavirus 2: NEGATIVE

## 2020-04-18 LAB — PROTIME-INR
INR: 1.1 (ref 0.8–1.2)
Prothrombin Time: 13.5 seconds (ref 11.4–15.2)

## 2020-04-18 MED ORDER — VANCOMYCIN HCL IN DEXTROSE 1-5 GM/200ML-% IV SOLN: 1000.0000 mg | Freq: Once | INTRAVENOUS | Status: DC

## 2020-04-18 MED ORDER — OXYCODONE-ACETAMINOPHEN 10-325 MG PO TABS: 1.0000 | ORAL_TABLET | Freq: Three times a day (TID) | ORAL | Status: DC | PRN

## 2020-04-18 MED ORDER — LACTATED RINGERS IV BOLUS (SEPSIS)
1000.0000 mL | Freq: Once | INTRAVENOUS | Status: AC
  Administered 2020-04-18: 1000 mL via INTRAVENOUS

## 2020-04-18 MED ORDER — IPRATROPIUM-ALBUTEROL 0.5-2.5 (3) MG/3ML IN SOLN
3.0000 mL | RESPIRATORY_TRACT | Status: DC
  Administered 2020-04-18: 3 mL via RESPIRATORY_TRACT
  Filled 2020-04-18: qty 3

## 2020-04-18 MED ORDER — ACETAMINOPHEN 500 MG PO TABS
1000.0000 mg | ORAL_TABLET | Freq: Once | ORAL | Status: AC
  Administered 2020-04-18: 1000 mg via ORAL
  Filled 2020-04-18: qty 2

## 2020-04-18 MED ORDER — IPRATROPIUM-ALBUTEROL 0.5-2.5 (3) MG/3ML IN SOLN
3.0000 mL | Freq: Once | RESPIRATORY_TRACT | Status: AC
  Administered 2020-04-18: 3 mL via RESPIRATORY_TRACT
  Filled 2020-04-18: qty 3

## 2020-04-18 MED ORDER — OXYCODONE HCL 5 MG PO TABS
10.0000 mg | ORAL_TABLET | Freq: Three times a day (TID) | ORAL | Status: DC | PRN
  Administered 2020-04-19: 10 mg via ORAL
  Filled 2020-04-18: qty 2

## 2020-04-18 MED ORDER — SODIUM CHLORIDE 0.9 % IV SOLN
2.0000 g | Freq: Once | INTRAVENOUS | Status: AC
  Administered 2020-04-18: 2 g via INTRAVENOUS
  Filled 2020-04-18: qty 20

## 2020-04-18 MED ORDER — LACTATED RINGERS IV SOLN: INTRAVENOUS | Status: DC

## 2020-04-18 MED ORDER — LACTATED RINGERS IV BOLUS (SEPSIS): 1000.0000 mL | Freq: Once | INTRAVENOUS | Status: DC

## 2020-04-18 MED ORDER — CARVEDILOL 6.25 MG PO TABS
6.2500 mg | ORAL_TABLET | Freq: Two times a day (BID) | ORAL | Status: DC
  Administered 2020-04-19 – 2020-04-30 (×23): 6.25 mg via ORAL
  Filled 2020-04-18 (×7): qty 1
  Filled 2020-04-18: qty 2
  Filled 2020-04-18 (×12): qty 1
  Filled 2020-04-18: qty 2
  Filled 2020-04-18 (×3): qty 1

## 2020-04-18 MED ORDER — ACETAMINOPHEN 325 MG PO TABS: 325.0000 mg | ORAL_TABLET | Freq: Three times a day (TID) | ORAL | Status: DC | PRN

## 2020-04-18 MED ORDER — PREDNISONE 20 MG PO TABS
40.0000 mg | ORAL_TABLET | Freq: Every day | ORAL | Status: DC
  Administered 2020-04-19: 40 mg via ORAL
  Filled 2020-04-18: qty 2

## 2020-04-18 MED ORDER — IPRATROPIUM-ALBUTEROL 0.5-2.5 (3) MG/3ML IN SOLN: 3.0000 mL | RESPIRATORY_TRACT | Status: DC | PRN

## 2020-04-18 MED ORDER — VANCOMYCIN HCL 750 MG/150ML IV SOLN
750.0000 mg | Freq: Two times a day (BID) | INTRAVENOUS | Status: DC
  Administered 2020-04-19 – 2020-04-20 (×3): 750 mg via INTRAVENOUS
  Filled 2020-04-18 (×5): qty 150

## 2020-04-18 MED ORDER — METHYLPREDNISOLONE SODIUM SUCC 125 MG IJ SOLR
125.0000 mg | Freq: Once | INTRAMUSCULAR | Status: AC
  Administered 2020-04-18: 125 mg via INTRAVENOUS
  Filled 2020-04-18: qty 2

## 2020-04-18 MED ORDER — SODIUM CHLORIDE 0.9 % IV SOLN
1.0000 g | INTRAVENOUS | Status: AC
  Administered 2020-04-19 – 2020-04-24 (×6): 1 g via INTRAVENOUS
  Filled 2020-04-18: qty 10
  Filled 2020-04-18: qty 1
  Filled 2020-04-18 (×4): qty 10

## 2020-04-18 MED ORDER — POLYETHYLENE GLYCOL 3350 17 G PO PACK
17.0000 g | PACK | Freq: Every day | ORAL | Status: DC
  Administered 2020-04-23 – 2020-04-28 (×6): 17 g via ORAL
  Filled 2020-04-18 (×8): qty 1

## 2020-04-18 MED ORDER — ALBUTEROL SULFATE HFA 108 (90 BASE) MCG/ACT IN AERS: 6.0000 | INHALATION_SPRAY | Freq: Once | RESPIRATORY_TRACT | Status: DC

## 2020-04-18 MED ORDER — VANCOMYCIN HCL 1500 MG/300ML IV SOLN
1500.0000 mg | Freq: Once | INTRAVENOUS | Status: AC
  Administered 2020-04-18: 1500 mg via INTRAVENOUS
  Filled 2020-04-18: qty 300

## 2020-04-18 MED ORDER — ALBUTEROL SULFATE HFA 108 (90 BASE) MCG/ACT IN AERS
INHALATION_SPRAY | RESPIRATORY_TRACT | Status: AC
  Filled 2020-04-18: qty 6.7

## 2020-04-18 NOTE — ED Triage Notes (Signed)
Pt BIB [facility] for eval of fever. Ems reports daughter noted she a fever this afternoon, prompting them to call EMS shortly PTA. Pt reports nausea earlier. EMS reports redness/weeping, foul odor. Pt tachypneic to the 30s, satting in the 80s on RA, improved to low 90s on NRB. Ems reports possible necrosis to L leg.

## 2020-04-18 NOTE — ED Provider Notes (Signed)
[facility] EMERGENCY DEPARTMENT Provider Note   CSN: [id number] Arrival date & time: 04/18/20  1916     History Chief Complaint  Patient presents with  . Fever    [name] is a 69 y.o. female with history of ITP, COPD, hypertension, nephrolithiasis, GERD, CKD presents for evaluation of acute onset, progressively worsening generalized weakness for a few days.  Today family noted that she was febrile with a temperature of around 101 F.  When they rechecked her temperature later it was higher so they called out for EMS.  On EMS arrival she was noted to be hypoxic to 85% on her usual 2 L supplemental oxygen via nasal cannula and placed her on a nonrebreather.  She does tell me that she feels more short of breath than baseline and notes dyspnea on exertion.  She denies chest pain or change in chronic cough.  She denies abdominal pain or vomiting but she has had some nausea.  No urinary symptoms.   She has chronic bilateral lower extremity wounds that she reports have been worsening over the last couple of weeks. She states that she was previously receiving home health several times a week but they have not come to her home for the last 3 weeks.  She has had the same dressings on her lower extremities during that time.  She states over the last 2 weeks or so she has noticed a foul odor coming from her lower extremities.  She is unable to visualize her lower extremities due to chronic back pain.  Denies recent travel or surgeries, hemoptysis, prior history of DVT or PE, or hormone replacement therapy.  She is a former smoker, quit around 4 years ago. Denies pain to her lower extremities.  Has not tried anything for her symptoms.   The history is provided by the patient.       Past Medical History:  Diagnosis Date  . Arthritis    lumbar burst fx, osteoporosis  . Back pain 09/29/2013  . Blood dyscrasia   . Chest pain    denies  . Chronic kidney disease    passed  stones spontaneously- 40 yrs. ago   . Depression    states her current situation with activity limitations, she has a low spirit at times.   Marland Kitchen Dysrhythmia    occ tachycardia  . Fracture of vertebra, compression ([place])   . GERD (gastroesophageal reflux disease)    occ  . History of kidney stones   . HTN (hypertension)   . ITP (idiopathic thrombocytopenic purpura) 08/29/2013  . Leukocytosis, unspecified 09/05/2013  . Muscle cramp 11/16/2013  . Tachycardia    occ upon exersion, and when put on bp med    Patient Active Problem List   Diagnosis Date Noted  . Cellulitis 04/18/2020  . Venous stasis dermatitis of both lower extremities 02/25/2019  . Encounter for preventive care 10/20/2018  . Chronic respiratory failure with hypoxia and hypercapnia ([place]) 10/06/2018  . Polycythemia, secondary 10/05/2018  . Obesity hypoventilation syndrome ([place]) 10/04/2018  . BMI 45.0-49.9, adult ([place])   . Bilateral lower extremity edema   . Venous stasis dermatitis 09/30/2018  . Wound of lower extremity, bilateral 09/30/2018  . Chronic heart failure with preserved ejection fraction with moderate RV failure 06/09/2017  . GERD (gastroesophageal reflux disease) 11/13/2014  . Opiate-induced constipation 11/13/2014  . History of tobacco abuse 03/10/2014  . Chronic back pain secondary to traumatic L1 fracture 09/29/2013  . Idiopathic thrombocytopenic purpura ([place])  08/29/2013  . HTN (hypertension)     Past Surgical History:  Procedure Laterality Date  . ABDOMINAL HYSTERECTOMY    . ANTERIOR LAT LUMBAR FUSION N/A 03/28/2014   Procedure: Anterolateral decompression of L1 fracture with posterior segmental stabilization;  Surgeon: Kristeen Miss, MD;  Location: [place] [facility];  Service: Neurosurgery;  Laterality: N/A;  Anterolateral decompression of Lumbar One fracture with posterior segmental stabilization  . CESAREAN SECTION     x2  . CHEST TUBE INSERTION Left 03/28/2014   Procedure: CHEST TUBE INSERTION;  Surgeon:  Ivin Poot, MD;  Location: [facility];  Service: Thoracic;  Laterality: Left;  . INCISIONAL HERNIA REPAIR N/A 11/13/2014   Procedure: PRIMARY REPAIR INCARCERATED INCISIONAL HERNIA WITH PLACEMENT BIOLOGIC MESH;  Surgeon: Rolm Bookbinder, MD;  Location: [place];  Service: General;  Laterality: N/A;  . LAPAROTOMY Right 09   ovarian cyst  . TOTAL ABDOMINAL HYSTERECTOMY W/ BILATERAL SALPINGOOPHORECTOMY  97     OB History   No obstetric history on file.     Family History  Problem Relation Age of Onset  . Hypertension Other   . Heart attack Other   . Leukemia Paternal Uncle        leukemia  . Heart attack Mother   . Hypertension Mother   . Heart disease Father     Social History   Tobacco Use  . Smoking status: Former Smoker    Packs/day: 1.00    Years: 15.00    Pack years: 15.00    Types: Cigarettes    Quit date: 09/15/2014    Years since quitting: 5.5  . Smokeless tobacco: Never Used  Vaping Use  . Vaping Use: Never used  Substance Use Topics  . Alcohol use: Yes    Comment: rare  . Drug use: No    Home Medications Prior to Admission medications   Medication Sig Start Date End Date Taking? Authorizing Provider  amLODipine (NORVASC) 5 MG tablet Take 1 tablet (5 mg total) by mouth daily. 06/21/17  Yes Caren Griffins, MD  carvedilol (COREG) 6.25 MG tablet Take 1 tablet (6.25 mg total) by mouth 2 (two) times daily with a meal. 06/20/17  Yes Gherghe, Costin M, MD  diphenhydrAMINE-APAP, sleep, (TYLENOL PM EXTRA STRENGTH PO) Take 2 tablets by mouth at bedtime.    Yes [provider]  furosemide (LASIX) 80 MG tablet TAKE 1 TABLET BY MOUTH DAILY; 1 TABLET TWICE DAILY IF YOU GAIN 3 POUNDS IN 1 WEEK OR 5 POUNDS IN 1 DAY Patient taking differently: Take 80 mg by mouth See admin instructions. Take 80 mg by mouth once a day and increase to 80 mg two times a day if either 3 pounds are gained in a week OR 5 pounds in one day 03/28/20  Yes Aslam, Sadia, MD  lisinopril  (PRINIVIL,ZESTRIL) 2.5 MG tablet Take 1 tablet (2.5 mg total) by mouth daily. 06/21/17  Yes Gherghe, Vella Redhead, MD  oxyCODONE-acetaminophen (PERCOCET) 10-325 MG tablet Take 1 tablet by mouth 3 (three) times daily.    Yes [provider]  polyethylene glycol (MIRALAX / GLYCOLAX) 17 g packet Take 17 g by mouth daily as needed for mild constipation ([place]).    Yes [provider]  potassium chloride (K-DUR) 10 MEQ tablet Take 1 tablet (10 mEq total) by mouth daily. 06/20/17  Yes Gherghe, Vella Redhead, MD  promethazine (PHENERGAN) 12.5 MG tablet Take 1 tablet (12.5 mg total) by mouth every 6 (six) hours as needed  for nausea or vomiting. 10/12/18  Yes Carroll Sage, MD  senna-docusate (SENOKOT S) 8.6-50 MG tablet Take 2 tablets by mouth 2 (two) times daily. Patient taking differently: Take 2 tablets by mouth 2 (two) times daily as needed for mild constipation or moderate constipation.  10/20/18  Yes Santos-Sanchez, Merlene Morse, MD  Sennosides (EX-LAX PO) Take 1 tablet by mouth as needed (for constipation).    Yes [provider]  tiZANidine (ZANAFLEX) 2 MG tablet Take 2 mg by mouth every 6 (six) hours as needed for muscle spasms.  04/12/20  Yes [provider]  baclofen 5 MG TABS Take one tablet three times per day for 3 days. If spasms do not improve, take 2 tablets three times per day. If still having spasms on 2 tablets three times daily, please call the clinic. 04/11/20   Harvie Heck, MD    Allergies    Patient has no known allergies.  Review of Systems   Review of Systems  Constitutional: Positive for fatigue.  Respiratory: Positive for shortness of breath.   Cardiovascular: Positive for leg swelling.  Gastrointestinal: Negative for abdominal pain.  Genitourinary: Negative for urgency.  Skin: Positive for wound.  Neurological: Positive for weakness (Generalized).  All other systems reviewed and are negative.   Physical Exam Updated Vital Signs BP (!) 109/51  (BP Location: Left Wrist)   Pulse (!) 112   Temp (!) 102.2 F (39 C) (Oral)   Resp (!) 29   Ht 4\' 5"  (1.346 m)   Wt 93 kg   SpO2 (!) 86%   BMI 51.32 kg/m   Physical Exam Vitals and nursing note reviewed.  Constitutional:      General: She is not in acute distress.    Appearance: She is well-developed.  HENT:     Head: Normocephalic and atraumatic.  Eyes:     General:        Right eye: No discharge.        Left eye: No discharge.     Conjunctiva/sclera: Conjunctivae normal.  Neck:     Vascular: No JVD.     Trachea: No tracheal deviation.  Cardiovascular:     Rate and Rhythm: Normal rate and regular rhythm.     Comments: 2+ radial and DP/PT pulses bilaterally. Pulmonary:     Effort: Pulmonary effort is normal.     Comments: Scattered expiratory wheezes.  Tachypneic.  Speaking in shorter sentences.  SPO2 saturations 88 to 89% on her usual 2 L supplemental oxygen.  Improved to 94% on 4 L/min via nasal cannula but desaturates into the 70s when rolled over to her side Abdominal:     General: Bowel sounds are normal. There is no distension.     Palpations: Abdomen is soft.     Tenderness: There is no abdominal tenderness. There is no guarding.  Genitourinary:    Comments: Examination performed in the presence of a chaperone.  No frank rectal bleeding.  There is light brown stool caked along the buttocks and low back. Musculoskeletal:     Cervical back: Neck supple.  Skin:    General: Skin is warm.     Findings: Erythema present.     Comments: Candidal rash noted to the groin and under the breasts. See below image.  Patient with chronic venous stasis skin changes to the bilateral lower extremities with evidence of superimposed infection.  There are several areas that are weeping malodorous purulent fluid.  There is surrounding erythema and mild induration.  Some areas of desquamation as well.  Neurological:     Mental Status: She is alert.  Psychiatric:        Behavior:  Behavior normal.       ED Results / Procedures / Treatments   Labs (all labs ordered are listed, but only abnormal results are displayed) Labs Reviewed  COMPREHENSIVE METABOLIC PANEL - Abnormal; Notable for the following components:      Result Value   Sodium 131 (*)    Chloride 84 (*)    CO2 37 (*)    Glucose, Bld 148 (*)    Calcium 8.6 (*)    AST 55 (*)    ALT 54 (*)    Alkaline Phosphatase 127 (*)    Total Bilirubin 2.0 (*)    All other components within normal limits  CBC WITH DIFFERENTIAL/PLATELET - Abnormal; Notable for the following components:   WBC 21.8 (*)    Platelets 59 (*)    Neutro Abs 20.1 (*)    Lymphs Abs 0.4 (*)    Abs Immature Granulocytes 0.19 (*)    All other components within normal limits  URINALYSIS, ROUTINE W REFLEX MICROSCOPIC - Abnormal; Notable for the following components:   APPearance HAZY (*)    Hgb urine dipstick SMALL (*)    Leukocytes,Ua MODERATE (*)    Bacteria, UA RARE (*)    All other components within normal limits  I-STAT ARTERIAL BLOOD GAS, ED - Abnormal; Notable for the following components:   pCO2 arterial 74.1 (*)    pO2, Arterial 58 (*)    Bicarbonate 42.3 (*)    TCO2 44 (*)    Acid-Base Excess 14.0 (*)    Calcium, Ion 1.09 (*)    All other components within normal limits  SARS CORONAVIRUS 2 BY RT PCR (HOSPITAL ORDER, [place] [facility])  CULTURE, BLOOD (ROUTINE X 2)  CULTURE, BLOOD (ROUTINE X 2)  URINE CULTURE  LACTIC ACID, PLASMA  PROTIME-INR  APTT  BRAIN NATRIURETIC PEPTIDE  BLOOD GAS, ARTERIAL  HIV ANTIBODY (ROUTINE TESTING W REFLEX)  COMPREHENSIVE METABOLIC PANEL  MAGNESIUM  CBC WITH DIFFERENTIAL/PLATELET  PHOSPHORUS    EKG None  Radiology DG Chest Portable 1 View  Result Date: 04/18/2020 CLINICAL DATA:  Shortness of breath, fever, possible necrosis of the lower extremities EXAM: PORTABLE CHEST 1 VIEW COMPARISON:  Radiograph 02/25/2019, CT 09/28/2013 FINDINGS: Low lung volumes. Some  bandlike opacities may reflect areas of subsegmental atelectasis with more patchy consolidative opacity in the right infrahilar lung which could reflect further atelectasis or infectious consolidation. No pneumothorax or visible effusion. Stable cardiomediastinal contours. No acute osseous or soft tissue abnormality. Degenerative changes are present in the imaged spine and shoulders. Postsurgical changes from prior L1 corpectomy and spacer placement, incompletely assessed on this exam. IMPRESSION: Low lung volumes with bandlike opacities may reflect areas of subsegmental atelectasis More patchy consolidative opacity in the right infrahilar lung which could reflect further atelectasis or infectious consolidation. Electronically Signed   By: Lovena Le M.D.   On: 04/18/2020 20:46   US Abdomen Limited RUQ  Result Date: 04/18/2020 CLINICAL DATA:  Elevated liver function tests. EXAM: ULTRASOUND ABDOMEN LIMITED RIGHT UPPER QUADRANT COMPARISON:  None. FINDINGS: Gallbladder: Multiple shadowing echogenic gallstones are seen within the gallbladder without evidence of gallbladder wall thickening (4.3 mm). No sonographic Murphy sign noted by sonographer. Common bile duct: Diameter: The common bile duct is not clearly visualized. Liver: No focal lesion identified. There is diffusely increased echogenicity of the  liver parenchyma. Portal vein is patent on color Doppler imaging with normal direction of blood flow towards the liver. Other: It should be noted that the study is limited secondary to inability of the patient to lay flat during the examination. IMPRESSION: 1. Cholelithiasis without evidence of acute cholecystitis. 2. Fatty liver. Electronically Signed   By: Virgina Norfolk M.D.   On: 04/18/2020 22:02    Procedures .Critical Care Performed by: Renita Papa, PA-C Authorized by: Renita Papa, PA-C   Critical care provider statement:    Critical care time (minutes):  45   Critical care was necessary to  treat or prevent imminent or life-threatening deterioration of the following conditions:  Sepsis and respiratory failure   Critical care was time spent personally by me on the following activities:  Discussions with consultants, evaluation of patient's response to treatment, examination of patient, ordering and performing treatments and interventions, ordering and review of laboratory studies, ordering and review of radiographic studies, pulse oximetry, re-evaluation of patient's condition, obtaining history from patient or surrogate and review of old charts   (including critical care time)  Medications Ordered in ED Medications  lactated ringers infusion ( Intravenous New Bag/Given 04/18/20 2102)  vancomycin (VANCOREADY) IVPB 1500 mg/300 mL (1,500 mg Intravenous New Bag/Given 04/18/20 2211)  vancomycin (VANCOREADY) IVPB 750 mg/150 mL (has no administration in time range)  oxyCODONE (Oxy IR/ROXICODONE) immediate release tablet 10 mg (has no administration in time range)    And  acetaminophen (TYLENOL) tablet 325 mg (has no administration in time range)  carvedilol (COREG) tablet 6.25 mg (has no administration in time range)  polyethylene glycol (MIRALAX / GLYCOLAX) packet 17 g (has no administration in time range)  ipratropium-albuterol (DUONEB) 0.5-2.5 (3) MG/3ML nebulizer solution 3 mL (has no administration in time range)    Followed by  ipratropium-albuterol (DUONEB) 0.5-2.5 (3) MG/3ML nebulizer solution 3 mL (has no administration in time range)  predniSONE (DELTASONE) tablet 40 mg (has no administration in time range)  acetaminophen (TYLENOL) tablet 1,000 mg (1,000 mg Oral Given 04/18/20 2102)  cefTRIAXone (ROCEPHIN) 2 g in sodium chloride 0.9 % 100 mL IVPB (0 g Intravenous Stopped 04/18/20 2133)  lactated ringers bolus 1,000 mL (0 mLs Intravenous Stopped 04/18/20 2202)  methylPREDNISolone sodium succinate (SOLU-MEDROL) 125 mg/2 mL injection 125 mg (125 mg Intravenous Given 04/18/20 2103)   ipratropium-albuterol (DUONEB) 0.5-2.5 (3) MG/3ML nebulizer solution 3 mL (3 mLs Nebulization Given 04/18/20 2211)    ED Course  I have reviewed the triage vital signs and the nursing notes.  Pertinent labs & imaging results that were available during my care of the patient were reviewed by me and considered in my medical decision making (see chart for details).    MDM Rules/Calculators/A&P                          Patient presenting for evaluation of fever, generalized weakness, shortness of breath, worsening bilateral lower extremity wounds.  She is febrile, tachycardic, tachypneic and hypoxic on her usual home O2 requirement.  EMS placed her on nonrebreather but was able to be weaned down to 5 L/min via nasal cannula.  Her lower extremity dressings were removed which revealed wounds concerning for cellulitis with purulent drainage.  Peripheral pulses are intact.  In the setting of fever and tachycardia with suspected source, will initiate sepsis protocol with 30 cc/kg fluid bolus and broad-spectrum antibiotics.  She is critically ill and will likely require admission to the  hospital for further evaluation and management.  Lab work reviewed and interpreted by myself shows leukocytosis of 21.8, thrombocytopenia with platelet count of 59, acutely elevated LFTs with elevated total bilirubin of 2.0.  Unsure of clinic significance as her abdomen is soft and nontender.  Will obtain right upper quadrant ultrasound for further evaluation.  No renal insufficiency.  Her bicarb today is elevated, concerning for retention in the setting of COPD.  She does have some scattered expiratory wheezes.  Will give Solu-Medrol and provide DuoNeb as her Covid test is negative.  Her chest x-ray shows subsegmental atelectasis and possible superimposed infectious consolidation.  Vancomycin and Rocephin should cover for community-acquired pneumonia pathogens.  Internal medicine teaching service to admit.  Spoke with Dr.  Sharon Seller with internal medicine teaching service who agrees to some care of patient.  While in the ED the patient's respiratory status began to decline.  Her tachypnea worsened and her SPO2 saturations began to drop.  ABG shows PCO2 74.1.  BiPAP ordered.    Final Clinical Impression(s) / ED Diagnoses Final diagnoses:  Elevated LFTs  Sepsis with acute hypoxic respiratory failure without septic shock, due to unspecified organism [facility])  Bilateral lower leg cellulitis    Rx / DC Orders ED Discharge Orders    None       Debroah Baller 04/18/20 2343    Maudie Flakes, MD 04/18/20 [phone]

## 2020-04-18 NOTE — Progress Notes (Signed)
Pharmacy Antibiotic Note  [name] is a 69 y.o. female admitted on 04/18/2020 with sepsis. The patient also received one dose of ceftriaxone 2 g IV. Pharmacy has been consulted for vancomycin dosing.   Plan: Vancomycin 1500 mg IV once then 750 mg q12h thereafter Monitor renal function, cultures, clinical status and length of therapy Order vancomycin troughs as indicated Deescalate antibiotic therapy as appropriate  Height: 4\' 5"  (134.6 cm) Weight: 93 kg (205 lb 0.4 oz) IBW/kg (Calculated) : 29.4  Temp (24hrs), Avg:102.2 F (39 C), Min:102.2 F (39 C), Max:102.2 F (39 C)  Recent Labs  Lab 04/18/20 1935 04/18/20 2015  WBC  --  21.8*  CREATININE  --  PENDING  LATICACIDVEN 1.2  --     CrCl cannot be calculated (This lab value cannot be used to calculate CrCl because it is not a number: PENDING).    No Known Allergies  Antimicrobials this admission: 8/4 ceftriaxone x1 8/4 vancomycin >>  Microbiology results: 8/4 BCx: sent 8/4 UCx: sent  Thank you for allowing pharmacy to be a part of this patient's care.  Shauna Hugh, PharmD, [place]  PGY-1 Pharmacy Resident 04/18/2020 9:10 PM  Please check [email or web address] for unit-specific pharmacy phone numbers.

## 2020-04-18 NOTE — H&P (Addendum)
Date: 04/19/2020               Patient Name:  [name] MRN: [id number]  DOB: 07-Sep-1951 Age / Sex: 69 y.o., female   PCP: [name], MD         Medical Service: Internal Medicine Teaching Service         Attending Physician: Dr. Evette Doffing, Mallie Mussel, *    First Contact: Dr. Collene Gobble Pager: [phone]  Second Contact: Dr. Truman Hayward Pager: [phone]       After Hours (After 5p/  First Contact Pager: [phone]  weekends / holidays): Second Contact Pager: [phone]   Chief Complaint: shortness of breath  History of Present Illness:  [name] is a 69 y.o. F w/ PMHx ITP, HFpEF with moderate RV failure, HTN, L1 fx with chronic back pain and opiate-induced constipation, GERD, and tobacco use disorder on 24/7 2L [place] presenting with worsening shortness of breath over the past couple of days associated with generalized weakness. The patient's daughter who lives with her states that she acutely developed a fever this morning with a temperature of 101.8 degrees, which increased in the afternoon to 102.8. Her daughter notes she was disoriented around 4pm today, dialing the house phone instead of the hospital, and picking up the TV remote as if it were a phone. She called EMS, who noted that she was hypoxic to 85% on her usual 2L [place]. The patient does endorse chills and says she feels cold. She also endorses fatigue and says she developed nausea with dry-heaving this morning. She notes the swelling in her legs has worsened recently. She has chronic venous stasis with ulcers that were previously managed by a wound care nurse that came to her home at least weekly, but she states nursing staff have not come by her house in about 4 weeks. She had had the same bandages on her legs for the past 4 weeks. Her daughter notes her legs developed an odor about 1 week ago. ED staff note the patient's clothing was soiled with stool and appeared as if it hadn't been changed in several days, and note a candidal groin  infection. She endorses chronic urinary frequency that she attributes to her Lasix 80mg  which she takes daily but denies any dysuria, urgency, or hematuria. She denies any diarrhea and states her last BM was around yesterday. She does endorse chronic back pain that makes it difficult for her to walk. She denies any CP, worsening of her chronic productive cough, or vomiting.   Home Meds: Current Meds  Medication Sig  . amLODipine (NORVASC) 5 MG tablet Take 1 tablet (5 mg total) by mouth daily.  . carvedilol (COREG) 6.25 MG tablet Take 1 tablet (6.25 mg total) by mouth 2 (two) times daily with a meal.  . diphenhydrAMINE-APAP, sleep, (TYLENOL PM EXTRA STRENGTH PO) Take 2 tablets by mouth at bedtime.   . furosemide (LASIX) 80 MG tablet TAKE 1 TABLET BY MOUTH DAILY; 1 TABLET TWICE DAILY IF YOU GAIN 3 POUNDS IN 1 WEEK OR 5 POUNDS IN 1 DAY (Patient taking differently: Take 80 mg by mouth See admin instructions. Take 80 mg by mouth once a day and increase to 80 mg two times a day if either 3 pounds are gained in a week OR 5 pounds in one day)  . lisinopril (PRINIVIL,ZESTRIL) 2.5 MG tablet Take 1 tablet (2.5 mg total) by mouth daily.  Marland Kitchen oxyCODONE-acetaminophen (PERCOCET) 10-325 MG tablet Take 1 tablet by mouth  3 (three) times daily.   . polyethylene glycol (MIRALAX / GLYCOLAX) 17 g packet Take 17 g by mouth daily as needed for mild constipation (MIX AND DRINK).   . potassium chloride (K-DUR) 10 MEQ tablet Take 1 tablet (10 mEq total) by mouth daily.  . promethazine (PHENERGAN) 12.5 MG tablet Take 1 tablet (12.5 mg total) by mouth every 6 (six) hours as needed for nausea or vomiting.  . senna-docusate (SENOKOT S) 8.6-50 MG tablet Take 2 tablets by mouth 2 (two) times daily. (Patient taking differently: Take 2 tablets by mouth 2 (two) times daily as needed for mild constipation or moderate constipation. )  . Sennosides (EX-LAX PO) Take 1 tablet by mouth as needed (for constipation).   Marland Kitchen tiZANidine (ZANAFLEX) 2  MG tablet Take 2 mg by mouth every 6 (six) hours as needed for muscle spasms.    Current Meds  Medication Sig  . amLODipine (NORVASC) 5 MG tablet Take 1 tablet (5 mg total) by mouth daily.  . carvedilol (COREG) 6.25 MG tablet Take 1 tablet (6.25 mg total) by mouth 2 (two) times daily with a meal.  . diphenhydrAMINE-APAP, sleep, (TYLENOL PM EXTRA STRENGTH PO) Take 2 tablets by mouth at bedtime.   . furosemide (LASIX) 80 MG tablet TAKE 1 TABLET BY MOUTH DAILY; 1 TABLET TWICE DAILY IF YOU GAIN 3 POUNDS IN 1 WEEK OR 5 POUNDS IN 1 DAY (Patient taking differently: Take 80 mg by mouth See admin instructions. Take 80 mg by mouth once a day and increase to 80 mg two times a day if either 3 pounds are gained in a week OR 5 pounds in one day)  . lisinopril (PRINIVIL,ZESTRIL) 2.5 MG tablet Take 1 tablet (2.5 mg total) by mouth daily.  Marland Kitchen oxyCODONE-acetaminophen (PERCOCET) 10-325 MG tablet Take 1 tablet by mouth 3 (three) times daily.   . polyethylene glycol (MIRALAX / GLYCOLAX) 17 g packet Take 17 g by mouth daily as needed for mild constipation (MIX AND DRINK).   . potassium chloride (K-DUR) 10 MEQ tablet Take 1 tablet (10 mEq total) by mouth daily.  . promethazine (PHENERGAN) 12.5 MG tablet Take 1 tablet (12.5 mg total) by mouth every 6 (six) hours as needed for nausea or vomiting.  . senna-docusate (SENOKOT S) 8.6-50 MG tablet Take 2 tablets by mouth 2 (two) times daily. (Patient taking differently: Take 2 tablets by mouth 2 (two) times daily as needed for mild constipation or moderate constipation. )  . Sennosides (EX-LAX PO) Take 1 tablet by mouth as needed (for constipation).   Marland Kitchen tiZANidine (ZANAFLEX) 2 MG tablet Take 2 mg by mouth every 6 (six) hours as needed for muscle spasms.     Allergies: Allergies as of 04/18/2020  . (No Known Allergies)    Family History:  Family History  Problem Relation Age of Onset  . Hypertension Other   . Heart attack Other   . Leukemia Paternal Uncle         leukemia  . Heart attack Mother   . Hypertension Mother   . Heart disease Father     Social History: Patient lives at home with her daughter and son-in-law. She is not as open to her daughter helping with her care, but does state she has a good support system. She does not leave her house or drive. She smoked 1PPD intermittently for many years but quit smoking 4 years ago. She doesn't drink alcohol or use other drugs.   Review of Systems: A complete ROS  was negative except as per HPI.   Physical Exam: Blood pressure (!) 148/56, pulse 100, temperature 99.9 F (37.7 C), temperature source Oral, resp. rate 19, height 4\' 5"  (1.346 m), weight 93 kg, SpO2 91 %. General: Patient appears slightly unkempt. She is in acute distress secondary to trouble breathing.  Eyes: Sclera non-icteric. No conjunctival injection.  HENT: No nasal discharge. Difficult to appreciate any JVD. Respiratory: Patient is tachypneic with increased work of breathing. There are scattered end-expiratory wheezes bilaterally, worse on the left. Lung sounds diminished bilaterally. No rhonchi.  Cardiovascular: Patient is tachycardic to the 110's. Rate is regular. There is a 2/6 early systolic murmur heard over the entire anterior chest, loudest at the right upper sternal border. Radial pulses are 2+ bilaterally. PT and DP pulses are 1+ bilaterally. Trace pitting edema present bilaterally.  Abdominal: Soft and non-distended with minimal, diffuse tenderness to palpation but no guarding or rebound. Bowel sounds intact. Patient has significant minimally-pitting lower extremity edema bilaterally.  Skin: Bilateral lower extremities below the knees are erythematous and warm, with open, draining lesions. There is dark crusting in the flexural surface of the left knee. There is onychomycosis with scaling of both feet and scaling about her forehead.  Neurologic: Sensation is grossly intact to light touch throughout. No dysarthria or slurred  speech. No asterixis.  Musculoskeletal: Diffuse upper extremity muscle twitching is present intermittently and bilaterally. Decreased muscle bulk. Lower extremities are swollen with trace pitting edema, bilaterally. Psych: Patient is pleasant and cooperative. Patient had short episodes where she would string together nonsensical words.   EKG: personally reviewed my interpretation is sinus tachycardia at a rate of about 124bpm. Low voltage. T wave inversions in V1.   CXR: personally reviewed my interpretation is decreased lung volumes with patchy bibasilar consolidations most consistent with atelectasis. Less concerning for PNA.   Assessment & Plan by Problem: Active Problems:   Obesity hypoventilation syndrome (HCC)   Cellulitis  Acute on chronic hypercapnic, hypoxic respiratory failure  Patient endorses worsening orthopnea and SOB both at rest and with exertion over the past few days despite continuous 2L [place] home O2 supplementation. Patient is febrile to 102.2 degrees but denies worsening of her chronic productive cough. Patient is tachycardic with tachypnea and was started on BIPAP after desatting to the high 80's on 6L [place]. Currently satting at 94% on BIPAP. ABG shows pH 7.373, pCO2 74.1, pO2 58, bicarb 42.3. Repeat ABG: pH 7.348, pCO2 76.5, pO2 93, Bicarb 42.1. CXR shows bandlike opacities and right infrahilar patchy consolidation, most likely 2/2 atelectasis, less likely due to PNA. RF most likely 2/2 OHS but may also be due to COPD vs. Worsening pulmonary HTN. Patient has never had PFT testing. Wells score 3 for tachycardia and immobilization; PE less likely though cannot be excluded. - Continue BIPAP with goal O2 saturations >92% - Given increased pCO2 on BIPAP, stopped Percocet for now - Check morning ABG  - Give Duonebs (scheduled TID and PRN for wheezing/SOB)  - Start prednisone 40mg  daily for 4 days  - Azithromycin 500mg  x 1 then 250mg  x 4 for possible COPD exacerbation  - Check  ECHO - Check D-dimer  - Will require outpatient PFT testing   AMS in the setting of fever and leukocytosis  Patient endorses fatigue and daughter states that she had moments of confusion at home, thinking the TV remote was a phone and dialing incorrect numbers. On exam, patient is febrile and occasionally strings together nonsensical words. WBC 21.8, ANC 20.1,  lymphocyte count 0.4. Likely secondary to lower extremity cellulitis in the setting of chronic venous stasis with nonhealing ulcers. Cannot rule out PNA by CXR though this is less likely. Urine appears hazy with moderate leukocytes, but negative nitrites. Patient denies new urinary symptoms.  - Will empirically start vancomycin and ceftriaxone for presumed cellulitis - Will check blood cultures x 2  - Consulted wound care   - Check CMP, Mg2+, phosphorous. HgbA1c   HFpEF (EF 60-65%) Last ECHO 10/01/2018 showed EF 60-65% with moderate LVH and grade 1 diastolic dysfunction with mildly dilated RV with mild-moderately reduced systolic dysfunction and no valvular regurgitation. Patient endorses worsening lower extremity swelling. Lung sounds diminished, bilaterally. 2/6 early systolic murmur loudest along right sternal boarder present on exam. - Will check ECHO  - Check BNP, CMP  - Monitor strict I&O, weigh patient daily  Mild Transaminitis AST 55, ALT 54, ALP 127. RUQ demonstrates fatty liver with cholelithiasis but no evidence of acute cholecystitis. Patient denies alcohol use. May be 2/2 NAFLD vs. HF exacerbation given trace bilateral pitting edema.   - Check HepBsAg/Ab, HepBcAb IgM, HCV Ab  - Check alpha1-AT  - Check lipid panel  - Check BNP  Hypertension Blood pressures well-controlled this admission. - Continue home carvedilol 6.25mg  BID   Stable ITP Platelet count 59 with no active sources of bleeding noted. Patient has a history of nosebleeds on chronic O2 therapy. - Monitor platelet counts - Monitor for bleeding    Hyponatremia, resolved Likely secondary to dehydration - resolve after 1L LR bolus  - Continue LR at 159mL/hr and monitor fluid status  Mild Hypocalcemia Calcium 8.6, ionized calcium 1.09 - Replace as needed   Chronic Back Pain Patient takes Percocet 10-325mg  TID at home for chronic pain s/p L1 fracture. She has opioid-induced constipation for which she takes Senokot and Miralax.  - Give oxycodone 10mg  and Tylenol 325mg  TID PRN for severe pain - Continue Miralax  - PT/OT   Dispo: Admit patient to Inpatient with expected length of stay greater than 2 midnights.   Diet: NPO except meds  IVF: LR 16mL/Hr  DVT PPx: SCDs Code Status: Full code   Signed: [name], MD 04/19/2020, 7:34 AM  Pager: [phone] After 5pm on weekdays and 1pm on weekends: On Call pager: [phone]

## 2020-04-19 ENCOUNTER — Inpatient Hospital Stay (HOSPITAL_COMMUNITY): Payer: Medicare PPO

## 2020-04-19 ENCOUNTER — Other Ambulatory Visit: Payer: Self-pay

## 2020-04-19 DIAGNOSIS — I361 Nonrheumatic tricuspid (valve) insufficiency: Secondary | ICD-10-CM

## 2020-04-19 LAB — CBC WITH DIFFERENTIAL/PLATELET
Abs Immature Granulocytes: 0.24 10*3/uL — ABNORMAL HIGH (ref 0.00–0.07)
Basophils Absolute: 0.1 10*3/uL (ref 0.0–0.1)
Basophils Relative: 0 %
Eosinophils Absolute: 0 10*3/uL (ref 0.0–0.5)
Eosinophils Relative: 0 %
HCT: 40.2 % (ref 36.0–46.0)
Hemoglobin: 12.1 g/dL (ref 12.0–15.0)
Immature Granulocytes: 1 %
Lymphocytes Relative: 2 %
Lymphs Abs: 0.5 10*3/uL — ABNORMAL LOW (ref 0.7–4.0)
MCH: 28.4 pg (ref 26.0–34.0)
MCHC: 30.1 g/dL (ref 30.0–36.0)
MCV: 94.4 fL (ref 80.0–100.0)
Monocytes Absolute: 0.3 10*3/uL (ref 0.1–1.0)
Monocytes Relative: 2 %
Neutro Abs: 20.9 10*3/uL — ABNORMAL HIGH (ref 1.7–7.7)
Neutrophils Relative %: 95 %
Platelets: 50 10*3/uL — ABNORMAL LOW (ref 150–400)
RBC: 4.26 MIL/uL (ref 3.87–5.11)
RDW: 15.3 % (ref 11.5–15.5)
WBC: 22.1 10*3/uL — ABNORMAL HIGH (ref 4.0–10.5)
nRBC: 0 % (ref 0.0–0.2)

## 2020-04-19 LAB — LIPID PANEL
Cholesterol: 140 mg/dL (ref 0–200)
HDL: 25 mg/dL — ABNORMAL LOW (ref >40)
LDL Cholesterol: 95 mg/dL (ref 0–99)
Total CHOL/HDL Ratio: 5.6 RATIO
Triglycerides: 99 mg/dL (ref <150)
VLDL: 20 mg/dL (ref 0–40)

## 2020-04-19 LAB — PHOSPHORUS: Phosphorus: 3.7 mg/dL (ref 2.5–4.6)

## 2020-04-19 LAB — I-STAT ARTERIAL BLOOD GAS, ED
Acid-Base Excess: 13 mmol/L — ABNORMAL HIGH (ref 0.0–2.0)
Acid-Base Excess: 14 mmol/L — ABNORMAL HIGH (ref 0.0–2.0)
Bicarbonate: 42.1 mmol/L — ABNORMAL HIGH (ref 20.0–28.0)
Bicarbonate: 43.1 mmol/L — ABNORMAL HIGH (ref 20.0–28.0)
Calcium, Ion: 1.15 mmol/L (ref 1.15–1.40)
Calcium, Ion: 1.15 mmol/L (ref 1.15–1.40)
HCT: 38 % (ref 36.0–46.0)
HCT: 37 % (ref 36.0–46.0)
Hemoglobin: 12.9 g/dL (ref 12.0–15.0)
Hemoglobin: 12.6 g/dL (ref 12.0–15.0)
O2 Saturation: 96 %
O2 Saturation: 95 %
Patient temperature: 98.6
Patient temperature: 99.9
Potassium: 3.7 mmol/L (ref 3.5–5.1)
Potassium: 3.5 mmol/L (ref 3.5–5.1)
Sodium: 140 mmol/L (ref 135–145)
Sodium: 139 mmol/L (ref 135–145)
TCO2: 44 mmol/L — ABNORMAL HIGH (ref 22–32)
TCO2: 45 mmol/L — ABNORMAL HIGH (ref 22–32)
pCO2 arterial: 76.5 mmHg (ref 32.0–48.0)
pCO2 arterial: 78.8 mmHg (ref 32.0–48.0)
pH, Arterial: 7.348 — ABNORMAL LOW (ref 7.350–7.450)
pH, Arterial: 7.35 (ref 7.350–7.450)
pO2, Arterial: 93 mmHg (ref 83.0–108.0)
pO2, Arterial: 88 mmHg (ref 83.0–108.0)

## 2020-04-19 LAB — COMPREHENSIVE METABOLIC PANEL
ALT: 71 U/L — ABNORMAL HIGH (ref 0–44)
AST: 54 U/L — ABNORMAL HIGH (ref 15–41)
Albumin: 3.3 g/dL — ABNORMAL LOW (ref 3.5–5.0)
Alkaline Phosphatase: 104 U/L (ref 38–126)
Anion gap: 9 (ref 5–15)
BUN: 17 mg/dL (ref 8–23)
CO2: 38 mmol/L — ABNORMAL HIGH (ref 22–32)
Calcium: 8.5 mg/dL — ABNORMAL LOW (ref 8.9–10.3)
Chloride: 93 mmol/L — ABNORMAL LOW (ref 98–111)
Creatinine, Ser: 0.73 mg/dL (ref 0.44–1.00)
GFR calc Af Amer: >60 mL/min (ref >60)
GFR calc non Af Amer: >60 mL/min (ref >60)
Glucose, Bld: 210 mg/dL — ABNORMAL HIGH (ref 70–99)
Potassium: 3.5 mmol/L (ref 3.5–5.1)
Sodium: 140 mmol/L (ref 135–145)
Total Bilirubin: 1.1 mg/dL (ref 0.3–1.2)
Total Protein: 5.8 g/dL — ABNORMAL LOW (ref 6.5–8.1)

## 2020-04-19 LAB — ECHOCARDIOGRAM COMPLETE
Area-P 1/2: 3.6 cm2
Height: 53 in
S' Lateral: 3.6 cm
Weight: 3280.44 oz

## 2020-04-19 LAB — MAGNESIUM: Magnesium: 1.8 mg/dL (ref 1.7–2.4)

## 2020-04-19 LAB — CBG MONITORING, ED
Glucose-Capillary: 174 mg/dL — ABNORMAL HIGH (ref 70–99)
Glucose-Capillary: 154 mg/dL — ABNORMAL HIGH (ref 70–99)

## 2020-04-19 LAB — HEMOGLOBIN A1C
Hgb A1c MFr Bld: 6.1 % — ABNORMAL HIGH (ref 4.8–5.6)
Mean Plasma Glucose: 128.37 mg/dL

## 2020-04-19 LAB — BRAIN NATRIURETIC PEPTIDE: B Natriuretic Peptide: 211 pg/mL — ABNORMAL HIGH (ref 0.0–100.0)

## 2020-04-19 LAB — HEPATITIS B SURFACE ANTIGEN: Hepatitis B Surface Ag: NONREACTIVE

## 2020-04-19 LAB — APTT: aPTT: 31 seconds (ref 24–36)

## 2020-04-19 LAB — GLUCOSE, CAPILLARY
Glucose-Capillary: 147 mg/dL — ABNORMAL HIGH (ref 70–99)
Glucose-Capillary: 143 mg/dL — ABNORMAL HIGH (ref 70–99)

## 2020-04-19 LAB — HIV ANTIBODY (ROUTINE TESTING W REFLEX): HIV Screen 4th Generation wRfx: NONREACTIVE

## 2020-04-19 LAB — D-DIMER, QUANTITATIVE: D-Dimer, Quant: 0.36 ug/mL-FEU (ref 0.00–0.50)

## 2020-04-19 LAB — HEPATITIS B CORE ANTIBODY, IGM: Hep B C IgM: NONREACTIVE

## 2020-04-19 MED ORDER — ACETAMINOPHEN 325 MG PO TABS: 325.0000 mg | ORAL_TABLET | Freq: Three times a day (TID) | ORAL | Status: DC | PRN

## 2020-04-19 MED ORDER — OXYCODONE-ACETAMINOPHEN 5-325 MG PO TABS: 1.0000 | ORAL_TABLET | Freq: Three times a day (TID) | ORAL | Status: DC | PRN

## 2020-04-19 MED ORDER — OXYCODONE-ACETAMINOPHEN 5-325 MG PO TABS
1.0000 | ORAL_TABLET | Freq: Three times a day (TID) | ORAL | Status: DC | PRN
  Administered 2020-04-19 – 2020-04-21 (×4): 1 via ORAL
  Filled 2020-04-19 (×5): qty 1

## 2020-04-19 MED ORDER — IOHEXOL 300 MG/ML  SOLN
100.0000 mL | Freq: Once | INTRAMUSCULAR | Status: AC | PRN
  Administered 2020-04-19: 100 mL via INTRAVENOUS

## 2020-04-19 MED ORDER — INSULIN ASPART 100 UNIT/ML ~~LOC~~ SOLN
0.0000 [IU] | Freq: Three times a day (TID) | SUBCUTANEOUS | Status: DC
  Administered 2020-04-19 (×2): 2 [IU] via SUBCUTANEOUS
  Administered 2020-04-19 – 2020-04-20 (×3): 1 [IU] via SUBCUTANEOUS
  Administered 2020-04-23 – 2020-04-26 (×8): 2 [IU] via SUBCUTANEOUS
  Administered 2020-04-27: 1 [IU] via SUBCUTANEOUS
  Administered 2020-04-27: 3 [IU] via SUBCUTANEOUS
  Administered 2020-04-28: 2 [IU] via SUBCUTANEOUS
  Administered 2020-04-28 (×2): 1 [IU] via SUBCUTANEOUS
  Administered 2020-04-29: 3 [IU] via SUBCUTANEOUS
  Administered 2020-04-29: 2 [IU] via SUBCUTANEOUS
  Administered 2020-04-30: 1 [IU] via SUBCUTANEOUS
  Administered 2020-04-30: 3 [IU] via SUBCUTANEOUS

## 2020-04-19 MED ORDER — METRONIDAZOLE IN NACL 5-0.79 MG/ML-% IV SOLN
500.0000 mg | Freq: Four times a day (QID) | INTRAVENOUS | Status: DC
  Administered 2020-04-19 – 2020-04-20 (×3): 500 mg via INTRAVENOUS
  Filled 2020-04-19 (×3): qty 100

## 2020-04-19 MED ORDER — IPRATROPIUM-ALBUTEROL 0.5-2.5 (3) MG/3ML IN SOLN: 3.0000 mL | RESPIRATORY_TRACT | Status: DC | PRN

## 2020-04-19 MED ORDER — IPRATROPIUM-ALBUTEROL 0.5-2.5 (3) MG/3ML IN SOLN
3.0000 mL | Freq: Three times a day (TID) | RESPIRATORY_TRACT | Status: DC
  Administered 2020-04-19: 3 mL via RESPIRATORY_TRACT
  Filled 2020-04-19 (×2): qty 3

## 2020-04-19 MED ORDER — ENOXAPARIN SODIUM 40 MG/0.4ML ~~LOC~~ SOLN
40.0000 mg | SUBCUTANEOUS | Status: DC
  Administered 2020-04-19 – 2020-04-22 (×4): 40 mg via SUBCUTANEOUS
  Filled 2020-04-19 (×4): qty 0.4

## 2020-04-19 MED ORDER — MELATONIN 3 MG PO TABS
3.0000 mg | ORAL_TABLET | Freq: Once | ORAL | Status: AC
  Administered 2020-04-20: 3 mg via ORAL
  Filled 2020-04-19: qty 1

## 2020-04-19 MED ORDER — IPRATROPIUM-ALBUTEROL 0.5-2.5 (3) MG/3ML IN SOLN
3.0000 mL | RESPIRATORY_TRACT | Status: DC
  Administered 2020-04-19 – 2020-04-21 (×9): 3 mL via RESPIRATORY_TRACT
  Filled 2020-04-19 (×10): qty 3

## 2020-04-19 MED ORDER — NYSTATIN 100000 UNIT/GM EX POWD
Freq: Two times a day (BID) | CUTANEOUS | Status: DC
  Filled 2020-04-19 (×2): qty 15

## 2020-04-19 MED ORDER — AZITHROMYCIN 250 MG PO TABS: 250.0000 mg | ORAL_TABLET | Freq: Every day | ORAL | Status: DC

## 2020-04-19 MED ORDER — OXYCODONE HCL 5 MG PO TABS: 5.0000 mg | ORAL_TABLET | Freq: Three times a day (TID) | ORAL | Status: DC | PRN

## 2020-04-19 MED ORDER — AZITHROMYCIN 250 MG PO TABS
500.0000 mg | ORAL_TABLET | Freq: Every day | ORAL | Status: AC
  Administered 2020-04-19: 500 mg via ORAL
  Filled 2020-04-19: qty 2

## 2020-04-19 MED ORDER — PERFLUTREN LIPID MICROSPHERE
1.0000 mL | INTRAVENOUS | Status: AC | PRN
  Administered 2020-04-19: 2 mL via INTRAVENOUS
  Filled 2020-04-19: qty 10

## 2020-04-19 NOTE — Hospital Course (Addendum)
Reports sleeping pretty well last night. Reports breathing better compared to home. Discussed inability to send to SNF on BiPAP and attempting to get CPAP. Discussed attempting to get sleep study done at [facility].

## 2020-04-19 NOTE — Progress Notes (Addendum)
PHARMACY - PHYSICIAN COMMUNICATION CRITICAL VALUE ALERT - BLOOD CULTURE IDENTIFICATION (BCID)  [name] is an 69 y.o. female who presented to [facility] on 04/18/2020 with a chief complaint of sepsis due to cellulitis  Assessment:  One positive blood culture with Gram Positive Rods - anaerobic bottle, source thought to be cellulitis  Name of physician (or Provider) Contacted: Dr. Konrad Penta  Current antibiotics: Ceftriaxone 1 gram IV q24hr  Changes to prescribed antibiotics recommended:  Add either metronidazole or clindamycin to current regimen Recommend ation accepted by MD   Alanda Slim, PharmD, FCCM Clinical Pharmacist Please see AMION for all Pharmacists' Contact Phone Numbers 04/19/2020, 8:15 PM

## 2020-04-19 NOTE — Progress Notes (Signed)
Messaged by nursing staff regarding worsening dyspnea. Examined and evaluated at bedside. She was noted to be satting in low 90s on 15L high flow, however holding steady saturation. She denies worsening dyspnea. Discussed watching her oxygen sat at current level and holding off on any anxiolytics at this time. [name] expressed understanding.

## 2020-04-19 NOTE — Progress Notes (Addendum)
Blood culture growing anaerobic gram positive rods from one bottle secondary to cellulitis of LLE. Patient assessed for crepitus. No crepitus palpitated bilateral lower extremities but does have pain out of proportion to exam of superior left calf in area of erythema. Will obtain CT, add flagyl, may need to consider DVT doppler if negative for abscess or gas.   ADDENDUM:  - CT without evidence of gas or abscess. Non-specific muscular edema seen along the vastus lateralis, tibialis anterior, and gastrocnemius, reactive vs. Myositis. No intramuscular air. Discussed findings with radiology with similar conclusion, MRI would only confirm what's already seen. Continue broad spectrum antibiotics. Add CK.   Evelyn Finger A, DO 04/19/2020, 10:00 PM Pager: [phone]

## 2020-04-19 NOTE — Progress Notes (Addendum)
Subjective:  Patient seen at bedside this AM. She reports back pain and being very tired. She is able to move her feet but it does cause pain. Reports she cannot go anywhere because she cannot lift her legs into a car. Her daughter and fiance lives with her and helps her.   Objective:  Vital signs in last 24 hours: Vitals:   04/19/20 0245 04/19/20 0400 04/19/20 0418 04/19/20 0500  BP: (!) 123/54 (!) 114/53 127/65 137/64  Pulse: 97 96 97 98  Resp: 19 (!) 22 (!) 32 (!) 22  Temp:      TempSrc:      SpO2: 92% 96% 94% 93%  Weight:      Height:       Physical Exam: General: uncomfortable, anxious laying in bed on noninvasive PPV machine Skin: Bilateral lower extremities distal to knees warm with large, erythematous, crusted lesions with purulence. Left leg more extensive than right.   Assessment/Plan:  Active Problems:   Obesity hypoventilation syndrome (HCC)   Cellulitis  Patient is 69yo female with obesity hypoventilation syndrome, chronic venous stasis, HFpEF, stable ITP admitted for acute on chronic hypercapnic, hypoxic respiratory failure with extensive cellulitis in bilateral lower extremities.   #Acute hypercapnic, hypoxic respiratory failure #Obesity hypoventilation syndrome Patient reports worsening SOB, orthopnea, weakness over the last few days. She has chronic productive cough but reports no change during this time. She is usually at home on 2L [place]. When EMS arrived, SpO2 85% on 2L [place]. In ED, patient started on BiPAP after sating upper 80's on 6L [place]. Multiple ABG's showed normal pH, pCO2 in 70's. D-dimer neg. CXR not consistent with PNA. This AM, patient on noninvasive PPV at 22/6 with FiO2 w/ tachypnea. No wheezing on exam. Originally treated as COPD exacerbation, but presentation more consistent with respiratory failure d/t sepsis 2/2 extensive cellulitis. Will continue DuoNebs while awake, wean O2 as able w/ goal >92%. This AM she was unable to tolerate [place], began 15L [place].  -High flow O2, BiPAP if needed w/ goal>92% -DuoNebs q4 while awake -d/c pred, azithromycin -Outpatient PFT s/p hospitalization  #Sepsis 2/2 LLE cellulitis Patient presents with cellulitis 2/2 chronic venous stasis ulcers. These were managed by [facility] RN, but last visit ~4wk ago, and bandages had not been changed since then. Reportedly patient febrile to 102.8 at home before arrival. Family says patient was altered at home as well. Upon arrival, patient febrile, tachycardic, tachypneic. Leukocytosis present, lactic acid normal. Empirically started on vanc, ceftriaxone. Will need to ensure patient has sufficient follow-up upon discharge. -Vanc, ceftriaxone (8/4-) -Pending BCx -WOC  #HFpEF Given recent leg swelling, hx of HF, and last Echo in January 2020, r/p Echo today. Today's Echo EF >75%, moderate LVH, elevated PA pressure. BNP on admission 211.  -I&Os -hold home lasix  #Mild Transaminitis On arrival AST 55, ALT 54. RUQ u/s revealed cholelithiasis, fatty liver. NAFLD v HF. Lipid panel normal. Pending liver labs.  -BNP 211 -HBcAb neg, pending HBsAg, HCV Ab -Pending alpha-1 antitrypsin  #ITP Patient last seen by hematology in 2015 as she has been unable to leave her house. Previously treated with rituximab without improvement. On arrival, PLT 50,000, no signs of active bleeding. Given patient's current state of health and history of immobility, will put on Lovenox for DVT prophylaxis given PLT >50. -Lovenox 40mg  QD  #Disability 2/2 morbid obesity Patient has been unable to leave house for awhile d/t difficulty lifting legs. She lives with daughter and fiance that helps her  when needed. On arrival, it was noted by ED staff patient's clothing was soiled with stool and appeared disheveled. It will be important to set-up good follow-up for the patient, including SW, PT, OT for best recommendations moving forward. -PT/OT -CSW  #HTN BP stable since admission, will continue home  meds. -Carvedilol 6.25mg  BID  #Chronic Back Pain Patient has had chronic back pain 2/2 L1 fracture, for which she takes Percocet. She also experiences opioid-induced constipation, taking Senokot, Miralax. -Oxy 10mg , tylenol 325mg  TID PRN -Miralax  Diet: HH IVF: n/a DVT PPX: Lovenox 40mg  QD Code Status: Full Prior to Admission Living Arrangement: home Anticipated Discharge Location: pending PT/OT eval Barriers to Discharge: hypoxia Dispo: Anticipated discharge in approximately 2-3 day(s).   Sanjuan Dame, MD 04/19/2020, 5:28 AM Pager: [phone] After 5pm on weekdays and 1pm on weekends: On Call pager [phone]

## 2020-04-19 NOTE — Consult Note (Addendum)
[place] Nurse wound consult note Pt is familiar to [facility] team from previous admission.  Consult requested for bilat legs; performed remotely after review of photos and progress notes in the EMR.  Reason for Consult: Pt has chronic venous stasis skin changes to legs.  She states she developed increased edema and leaking this week. Pt previously stated she has used compression stockings in the past, but can no longer put them on related to her decreased mobility and increased swelling. Wound type: BLE with generalized edema and erythemia.  Multiple areas of dark brown dry crusted skin and patchy sites are yellow moist partial thickness wounds.  Drainage (amount, consistency, odor) mod amt yellow drainage, some odor reported. Dressing procedure/placement/frequency: Topical treatment orders provided for bedside nurses to perform as follows to provide light compression and promote drying and healing: Bedside nurse: Apply Xeroform gauze to bilat legs Q day, then cover with abd pads to absorb drainage, then apply kerlex and ace wrap in a spiral fashion, beginning just behind the toes to below the knees. Pt could benefit from home health assistance after discharge for dressing change assistance, please order if desired. Please re-consult if further assistance is needed.  Gae Dry MSN, RN, [place], [place], [place].

## 2020-04-19 NOTE — Progress Notes (Signed)
  Echocardiogram 2D Echocardiogram with definity has been performed.  Darlina Sicilian M 04/19/2020, 9:55 AM

## 2020-04-19 NOTE — ED Notes (Signed)
Lunch Tray Ordered @ 1049. °

## 2020-04-19 NOTE — Progress Notes (Signed)
Pt has quite high CO2 and is on 15 L high flow O2, SOB and distressed.  Will re-attempt as pt can tolerate.   04/19/20 1200  PT Visit Information  Last PT Received On 04/19/20  Reason Eval/Treat Not Completed Patient not medically ready    Mee Hives, PT MS Acute Rehab Dept. Number: [facility] [place]

## 2020-04-20 DIAGNOSIS — G8929 Other chronic pain: Secondary | ICD-10-CM

## 2020-04-20 DIAGNOSIS — I5030 Unspecified diastolic (congestive) heart failure: Secondary | ICD-10-CM

## 2020-04-20 DIAGNOSIS — I110 Hypertensive heart disease with heart failure: Secondary | ICD-10-CM

## 2020-04-20 DIAGNOSIS — M549 Dorsalgia, unspecified: Secondary | ICD-10-CM

## 2020-04-20 DIAGNOSIS — R7401 Elevation of levels of liver transaminase levels: Secondary | ICD-10-CM

## 2020-04-20 DIAGNOSIS — I878 Other specified disorders of veins: Secondary | ICD-10-CM

## 2020-04-20 LAB — CBC
HCT: 38.1 % (ref 36.0–46.0)
Hemoglobin: 11 g/dL — ABNORMAL LOW (ref 12.0–15.0)
MCH: 27.8 pg (ref 26.0–34.0)
MCHC: 28.9 g/dL — ABNORMAL LOW (ref 30.0–36.0)
MCV: 96.5 fL (ref 80.0–100.0)
Platelets: 50 10*3/uL — ABNORMAL LOW (ref 150–400)
RBC: 3.95 MIL/uL (ref 3.87–5.11)
RDW: 15.2 % (ref 11.5–15.5)
WBC: 18.3 10*3/uL — ABNORMAL HIGH (ref 4.0–10.5)
nRBC: 0 % (ref 0.0–0.2)

## 2020-04-20 LAB — COMPREHENSIVE METABOLIC PANEL
ALT: 47 U/L — ABNORMAL HIGH (ref 0–44)
AST: 23 U/L (ref 15–41)
Albumin: 2.9 g/dL — ABNORMAL LOW (ref 3.5–5.0)
Alkaline Phosphatase: 82 U/L (ref 38–126)
Anion gap: 10 (ref 5–15)
BUN: 19 mg/dL (ref 8–23)
CO2: 38 mmol/L — ABNORMAL HIGH (ref 22–32)
Calcium: 8.5 mg/dL — ABNORMAL LOW (ref 8.9–10.3)
Chloride: 95 mmol/L — ABNORMAL LOW (ref 98–111)
Creatinine, Ser: 0.55 mg/dL (ref 0.44–1.00)
GFR calc Af Amer: >60 mL/min (ref >60)
GFR calc non Af Amer: >60 mL/min (ref >60)
Glucose, Bld: 114 mg/dL — ABNORMAL HIGH (ref 70–99)
Potassium: 3.6 mmol/L (ref 3.5–5.1)
Sodium: 143 mmol/L (ref 135–145)
Total Bilirubin: 0.6 mg/dL (ref 0.3–1.2)
Total Protein: 5.6 g/dL — ABNORMAL LOW (ref 6.5–8.1)

## 2020-04-20 LAB — HEPATITIS B SURFACE ANTIBODY, QUANTITATIVE: Hep B S AB Quant (Post): <3.1 m[IU]/mL — ABNORMAL LOW (ref >9.9)

## 2020-04-20 LAB — ALPHA-1-ANTITRYPSIN: A-1 Antitrypsin, Ser: 175 mg/dL (ref 101–187)

## 2020-04-20 LAB — TSH: TSH: 0.408 u[IU]/mL (ref 0.350–4.500)

## 2020-04-20 LAB — HCV AB W REFLEX TO QUANT PCR: HCV Ab: <0.1 s/co ratio (ref 0.0–0.9)

## 2020-04-20 LAB — CK: Total CK: 10 U/L — ABNORMAL LOW (ref 38–234)

## 2020-04-20 LAB — GLUCOSE, CAPILLARY
Glucose-Capillary: 127 mg/dL — ABNORMAL HIGH (ref 70–99)
Glucose-Capillary: 116 mg/dL — ABNORMAL HIGH (ref 70–99)
Glucose-Capillary: 143 mg/dL — ABNORMAL HIGH (ref 70–99)
Glucose-Capillary: 135 mg/dL — ABNORMAL HIGH (ref 70–99)

## 2020-04-20 LAB — HCV INTERPRETATION

## 2020-04-20 LAB — MRSA PCR SCREENING: MRSA by PCR: NEGATIVE

## 2020-04-20 MED ORDER — MELATONIN 5 MG PO TABS
5.0000 mg | ORAL_TABLET | Freq: Every evening | ORAL | Status: DC | PRN
  Administered 2020-04-24 – 2020-04-29 (×6): 5 mg via ORAL
  Filled 2020-04-20 (×6): qty 1

## 2020-04-20 MED ORDER — FUROSEMIDE 10 MG/ML IJ SOLN
40.0000 mg | Freq: Once | INTRAMUSCULAR | Status: AC
  Administered 2020-04-20: 40 mg via INTRAVENOUS
  Filled 2020-04-20: qty 4

## 2020-04-20 NOTE — Evaluation (Signed)
Occupational Therapy Evaluation Patient Details Name: [name] MRN: [id number] DOB: 11/29/1950 Today's Date: 04/20/2020    History of Present Illness 69yo female brought to the ED with AMS at home, found be septic likely due to purulent cellulitis of LLE. Needed NPPV in ED at FiO2 at 50%. CXR clear. Found to be covered in stool in ED. Also found to have acute respiratory failure. PMH HTN, vertebral compression fractures, depression, CKD, CP, LBP, lumbar fusion   Clinical Impression   PTA, pt lives with family (daughter works from home) and uses cane for mobility. Pt reports Modified Independent for dressing, sponge bathing, and toileting tasks though has been having increasing difficulty completing these tasks. Pt typically wears 2 L O2 at baseline, received on 6 L O2 today. Pt presents with diagnoses above and deficits in strength, cardiopulmonary tolerance, endurance, balance, and cognition. Pt required overall Max A x 2 for bed mobility, Mod A for initial sitting balance progressing to min guard. Pt sat EOB for 10 min, but ultimately deferred OOB activities due to back pain. Pt desats to 83% on 6 L O2 during activity, requiring cues for pursed lip breathing to recover. Pt requires Mod A for UB ADLs and up to Total A for LB ADLs. Recommend SNF for short term rehab to maximize independence.     Follow Up Recommendations  SNF;Supervision/Assistance - 24 hour    Equipment Recommendations  Other (comment) (TBD)    Recommendations for Other Services       Precautions / Restrictions Precautions Precautions: Fall;Other (comment) Precaution Comments: watch sats, morbid obesity, severe back pain Restrictions Weight Bearing Restrictions: No      Mobility Bed Mobility Overal bed mobility: Needs Assistance Bed Mobility: Rolling;Sidelying to Sit;Sit to Supine Rolling: Max assist;+2 for physical assistance Sidelying to sit: Max assist;+2 for physical assistance   Sit to supine: Max  assist;+2 for physical assistance   General bed mobility comments: maxAx2 for log rolliing, patient very anxious and hesitant about mobility today  Transfers                 General transfer comment: deferred- fatigue and pain    Balance Overall balance assessment: Needs assistance Sitting-balance support: Bilateral upper extremity supported;Feet unsupported Sitting balance-Leahy Scale: Fair Sitting balance - Comments: initially [place] for midline sitting, faded to min guard. Able to sit at EOB 10 mintues Postural control: Posterior lean     Standing balance comment: unable to try at eval                           ADL either performed or assessed with clinical judgement   ADL Overall ADL's : Needs assistance/impaired Eating/Feeding: Set up;Sitting   Grooming: Set up;Sitting   Upper Body Bathing: Moderate assistance;Sitting   Lower Body Bathing: Total assistance;Bed level   Upper Body Dressing : Moderate assistance;Sitting   Lower Body Dressing: Total assistance;Bed level Lower Body Dressing Details (indicate cue type and reason): Total A to don socks bed level. Pt reports wearing flip flops and shorts at home     Toileting- Clothing Manipulation and Hygiene: Total assistance;Bed level         General ADL Comments: Pt with deficits in cardiopulmonary tolerance (on 6 L O2 with desats - wears 2 L O2 at baseline), strength, balance and cognition.      Vision Baseline Vision/History: Wears glasses Wears Glasses: At all times Patient Visual Report: No change from baseline Vision Assessment?:  No apparent visual deficits     Perception     Praxis      Pertinent Vitals/Pain Pain Assessment: Faces Faces Pain Scale: Hurts whole lot Pain Location: back Pain Descriptors / Indicators: Aching;Sore;Grimacing;Guarding Pain Intervention(s): Monitored during session;Limited activity within patient's tolerance;Repositioned     Hand Dominance Right    Extremity/Trunk Assessment Upper Extremity Assessment Upper Extremity Assessment: Generalized weakness   Lower Extremity Assessment Lower Extremity Assessment: Defer to PT evaluation   Cervical / Trunk Assessment Cervical / Trunk Assessment: Kyphotic   Communication Communication Communication: No difficulties   Cognition Arousal/Alertness: Awake/alert Behavior During Therapy: Flat affect;Anxious Overall Cognitive Status: No family/caregiver present to determine baseline cognitive functioning Area of Impairment: Attention;Following commands;Awareness;Safety/judgement;Problem solving                   Current Attention Level: Sustained   Following Commands: Follows one step commands inconsistently;Follows one step commands with increased time Safety/Judgement: Decreased awareness of safety;Decreased awareness of deficits Awareness: Intellectual Problem Solving: Slow processing;Decreased initiation;Difficulty sequencing;Requires verbal cues;Requires tactile cues General Comments: Distractible - perseverating on removing socks when being cued for PLB. very anxious when it comes to mobility in general, perseverated on various activities like getting her sock on, often yells "I can't do this!"   General Comments  SpO2 desats to 83% on 6 L O2, required consistent cueing for pursed lip breathing for recovery to 92%. RR up to upper 30s during activity, noted to hold breath during bed mobility    Exercises     Shoulder Instructions      Home Living Family/patient expects to be discharged to:: Private residence Living Arrangements: Children Available Help at Discharge: Available PRN/intermittently;Family Type of Home: House Home Access: Stairs to enter [employer] of Steps: 3 Entrance Stairs-Rails: Can reach both Home Layout: One level     Bathroom Shower/Tub: Tub/shower unit;Other (comment) (reports does not use shower due to LE wounds)   Bathroom Toilet:  Standard     Home Equipment: Cane - single point;Bedside commode          Prior Functioning/Environment Level of Independence: Needs assistance  Gait / Transfers Assistance Needed: bathroom to bed mobilty with use of cane ADL's / Homemaking Assistance Needed: able to dress herself. daughter helps with cooking, cleaning. Pt reports increasing difficulty with sponge bathing tasks, reports daughter does not help so pt just had not been completing these tasks   Comments: Mod indep with SPC. Has been sponge bathing due to leg wounds. Daughter intermittently assists with ADL's        OT Problem List: Decreased strength;Decreased activity tolerance;Impaired balance (sitting and/or standing);Decreased cognition;Decreased safety awareness;Decreased knowledge of use of DME or AE;Cardiopulmonary status limiting activity;Pain      OT Treatment/Interventions: Self-care/ADL training;Therapeutic exercise;Energy conservation;DME and/or AE instruction;Therapeutic activities;Patient/family education    OT Goals(Current goals can be found in the care plan section) Acute Rehab OT Goals Patient Stated Goal: get back in bed, take a nap OT Goal Formulation: With patient Time For Goal Achievement: 05/04/20 Potential to Achieve Goals: Good ADL Goals Pt Will Perform Grooming: with modified independence;sitting Pt Will Perform Upper Body Bathing: with min guard assist;sitting Pt Will Perform Lower Body Bathing: with mod assist;sitting/lateral leans Additional ADL Goal #1: Pt to demonstrate ability to complete sit to stand transfer with Mod A x 2 in preparation for ADL transfers Additional ADL Goal #2: Pt to verbalize at least 3 energy conservation strategies to maximize ADL performance  OT Frequency: Min 2X/week  Barriers to D/C:            Co-evaluation PT/OT/SLP Co-Evaluation/Treatment: Yes Reason for Co-Treatment: Complexity of the patient's impairments (multi-system involvement);Necessary to  address cognition/behavior during functional activity;For patient/therapist safety   OT goals addressed during session: ADL's and self-care      AM-PAC OT "6 Clicks" Daily Activity     Outcome Measure Help from another person eating meals?: A Little Help from another person taking care of personal grooming?: A Little Help from another person toileting, which includes using toliet, bedpan, or urinal?: Total Help from another person bathing (including washing, rinsing, drying)?: Total Help from another person to put on and taking off regular upper body clothing?: A Lot Help from another person to put on and taking off regular lower body clothing?: Total 6 Click Score: 11   End of Session Equipment Utilized During Treatment: Oxygen Nurse Communication: Mobility status;Other (comment) (O2)  Activity Tolerance: Patient limited by fatigue;Patient limited by pain Patient left: in bed;with call bell/phone within reach;with bed alarm set  OT Visit Diagnosis: Unsteadiness on feet (R26.81);Other abnormalities of gait and mobility (R26.89);Muscle weakness (generalized) (M62.81)                Time: 8675-4492 OT Time Calculation (min): 26 min Charges:  OT General Charges $OT Visit: 1 Visit OT Evaluation $OT Eval Moderate Complexity: 1 Mod  Layla Maw, OTR/L  Layla Maw 04/20/2020, 1:00 PM

## 2020-04-20 NOTE — Progress Notes (Signed)
   Subjective:  Patient seen at bedside this AM. She reports she is still not feeling we.. Also says she isn't sleeping well. States she misses her dog, [name], and wants to go home soon. Denies leg pain, fever.  Objective:  Vital signs in last 24 hours: Vitals:   04/19/20 1918 04/19/20 2008 04/19/20 2317 04/20/20 0314  BP: 132/63  (!) 108/54 (!) 102/47  Pulse: 100  89   Resp:    20  Temp: 98.1 F (36.7 C)  98.2 F (36.8 C) 98.3 F (36.8 C)  TempSrc: Axillary  Oral Oral  SpO2:  97% 95%   Weight:      Height:       Physical Exam: General: Resting in bed, anxious but no acute distress, on HF O2 Pulm: Limited d/t pain. Bilateral rales. MSK: Both legs distal to knee wrapped with gauze.  Assessment/Plan:  Principal Problem:   Sepsis due to cellulitis [facility]) Active Problems:   Chronic idiopathic thrombocytopenia (HCC)   Chronic heart failure with preserved ejection fraction with moderate RV failure   Wound of lower extremity, bilateral   Obesity hypoventilation syndrome (HCC)   Chronic respiratory failure with hypoxia and hypercapnia (HCC)  #Acute hypercapnic, hypoxic respiratory failure #Obesity hypoventilation syndrome Patient's respiratory status improving. Overnight refused CPAP. This AM, she is on 6L HFNC, with rales, no wheezing. Still likely respiratory failure d/t sepsis. Continue current treatment plan of DuoNebs and wean O2 as able with goal >92%. Appreciate respiratory therapists' assistance. -HFNC, BiPAP if needed, goal >92% -CPAP QHS -DuoNebs q4 whil awake -Outpatient PFTs  #Sepsis 2/2 LLE cellulitis Patient remains afebrile, HDS. Leukocytosis downtrending. BCx w/ GPR, started on Flagyl overnight. Culture likely contaminated and will d/c. Will continue with current abx for now, pending MRSA swab. Will narrow if possible. -Vanc, ceftriaxone (8/4-) -Flagyl (8/5-8/6) -Appreciate WOC for assistance  #HFpEF Patient continues to have fluid, as noted on exam.  -IV  lasix 40mg  QD -I&Os  #Mild Transaminitis AST, ALT improved from yesterday. RUQ w/ fatty liver, lipid panel normal. Will continue to trend after IV lasix today. -Hepatitis labs neg -Pending alpha-1-antitrypsin -CMP trend  #ITP No active signs of bleeding. PLT 50,000. Will continue DVT prophylaxis given platelets stay at least 50,000. -Lovenox 40mg  QD  #Disability 2/2 morbid obesity Continue work with PT, OT, SW for ideal placement and plan moving forward when patient is medically ready for discharge. It appears patient has poor insight into extent of medication condition, will need continuing discussion with patient on best path towards recovery. -Per PT/OT - SNF, supervision/assistance 24hr  #HTN BP stable, continue home meds -Carvedilol 6.25mg  BID  #Chronic back pain Continue pain regimen. Given patient's respiratory status will monitor opioids. -Percocet 5mg  q8 PRN -Tylenol 325mg  TID PRN -Miralax for opioid-induced constipation  Diet: HH IVF: n/a DVT PPX: Lovenox 40mg  QD Code Status: Full Prior to Admission Living Arrangement: home Anticipated Discharge Location: SNF per PT/OT Barriers to Discharge: hypoxia  Dispo: Anticipated discharge in approximately 2-3 day(s).   Sanjuan Dame, MD 04/20/2020, 5:33 AM Pager: [phone] After 5pm on weekdays and 1pm on weekends: On Call pager [phone]

## 2020-04-20 NOTE — Evaluation (Signed)
Physical Therapy Evaluation Patient Details Name: [name] MRN: [id number] DOB: 1951-07-10 Today's Date: 04/20/2020   History of Present Illness  69yo female brought to the ED with AMS at home, found be septic likely due to purulent cellulitis of LLE. Needed NPPV in ED at FiO2 at 50%. CXR clear. Found to be covered in stool in ED. Also found to have acute respiratory failure. PMH HTN, vertebral compression fractures, depression, CKD, CP, LBP, lumbar fusion  Clinical Impression   Patient received in bed initially trying to refuse therapy, however able to be convinced to participate with education and coaxing. See below for physical assist levels- in general needs MaxAx2 to get to EOB and ModA to initially maintain sitting balance due to posterior lean, then faded to min guard and able to maintain for 10 minutes with intermittent SPO2 desat to 83%  At lowest on 6LPM O2. Able to bring sats back into 90s with coached PLB. Very anxious and pain limited, poor attention and likely poor insight into deficits. Left positioned to comfort in bed with all needs met, bed alarm active. Will strongly benefit from SNF moving forward.     Follow Up Recommendations SNF;Supervision/Assistance - 24 hour    Equipment Recommendations  Other (comment) (defer to next venue)    Recommendations for Other Services       Precautions / Restrictions Precautions Precautions: Fall;Other (comment) Precaution Comments: watch sats, morbid obesity, severe back pain Restrictions Weight Bearing Restrictions: No      Mobility  Bed Mobility Overal bed mobility: Needs Assistance Bed Mobility: Rolling;Sidelying to Sit;Sit to Supine Rolling: Max assist;+2 for physical assistance Sidelying to sit: Max assist;+2 for physical assistance   Sit to supine: Max assist;+2 for physical assistance   General bed mobility comments: maxAx2 for log rolliing, patient very anxious and hesitant about mobility today  Transfers                  General transfer comment: deferred- fatigue and pain  Ambulation/Gait             General Gait Details: deferred- fatigue and pain  Stairs            Wheelchair Mobility    Modified Rankin (Stroke Patients Only)       Balance Overall balance assessment: Needs assistance Sitting-balance support: Bilateral upper extremity supported;Feet unsupported Sitting balance-Leahy Scale: Fair Sitting balance - Comments: initially [place] for midline sitting, faded to min guard. Able to sit at EOB 10 mintues Postural control: Posterior lean     Standing balance comment: unable to try at eval                             Pertinent Vitals/Pain Pain Assessment: Faces Faces Pain Scale: Hurts whole lot Pain Location: back Pain Descriptors / Indicators: Aching;Sore;Grimacing;Guarding Pain Intervention(s): Limited activity within patient's tolerance;Monitored during session;Repositioned    Home Living Family/patient expects to be discharged to:: Private residence Living Arrangements: Children Available Help at Discharge: Available PRN/intermittently;Family Type of Home: House Home Access: Stairs to enter Entrance Stairs-Rails: Can reach both Entrance Stairs-Number of Steps: 3   Home Equipment: Cane - single point      Prior Function Level of Independence: Needs assistance   Gait / Transfers Assistance Needed: bathroom to bed mobilty  ADL's / Homemaking Assistance Needed: able to dress herself. daughter helps with cooking, cleaning, bathing.  Comments: Mod indep with SPC. Has been sponge bathing due  to leg wounds. Daughter intermittently assists with ADL's     Hand Dominance   Dominant Hand: Right    Extremity/Trunk Assessment   Upper Extremity Assessment Upper Extremity Assessment: Defer to OT evaluation    Lower Extremity Assessment Lower Extremity Assessment: Generalized weakness    Cervical / Trunk Assessment Cervical / Trunk  Assessment: Kyphotic  Communication   Communication: No difficulties  Cognition Arousal/Alertness: Awake/alert Behavior During Therapy: Flat affect;Anxious Overall Cognitive Status: No family/caregiver present to determine baseline cognitive functioning Area of Impairment: Attention;Following commands;Awareness;Safety/judgement;Problem solving                   Current Attention Level: Sustained   Following Commands: Follows one step commands inconsistently;Follows one step commands with increased time Safety/Judgement: Decreased awareness of safety;Decreased awareness of deficits Awareness: Intellectual Problem Solving: Slow processing;Decreased initiation;Difficulty sequencing;Requires verbal cues;Requires tactile cues General Comments: very anxious when it comes to mobility in general, perseverated on various activities like getting her sock on, often yells "I can't do this!"      General Comments General comments (skin integrity, edema, etc.): SPO2 drop to 83% on 6LPM, needed coached PLB to bring SPO2 back to 92% on 6LPM- tends to mouthbreath. VSS otherwise    Exercises     Assessment/Plan    PT Assessment Patient needs continued PT services  PT Problem List Decreased strength;Decreased cognition;Obesity;Decreased activity tolerance;Decreased safety awareness;Decreased balance;Pain;Decreased mobility;Decreased coordination       PT Treatment Interventions DME instruction;Balance training;Gait training;Stair training;Cognitive remediation;Functional mobility training;Patient/family education;Therapeutic exercise;Therapeutic activities    PT Goals (Current goals can be found in the Care Plan section)  Acute Rehab PT Goals Patient Stated Goal: get back in bed, take a nap PT Goal Formulation: With patient Time For Goal Achievement: 05/04/20 Potential to Achieve Goals: Fair    Frequency Min 2X/week   Barriers to discharge Inaccessible home environment;Decreased  caregiver support unsure of how much physical assist family will reliably provide    Co-evaluation               AM-PAC PT "6 Clicks" Mobility  Outcome Measure Help needed turning from your back to your side while in a flat bed without using bedrails?: A Lot Help needed moving from lying on your back to sitting on the side of a flat bed without using bedrails?: Total Help needed moving to and from a bed to a chair (including a wheelchair)?: Total Help needed standing up from a chair using your arms (e.g., wheelchair or bedside chair)?: Total Help needed to walk in hospital room?: Total Help needed climbing 3-5 steps with a railing? : Total 6 Click Score: 7    End of Session Equipment Utilized During Treatment: Oxygen Activity Tolerance: Patient tolerated treatment well;Patient limited by fatigue Patient left: in bed;with call bell/phone within reach;with bed alarm set Nurse Communication: Mobility status PT Visit Diagnosis: Unsteadiness on feet (R26.81);Difficulty in walking, not elsewhere classified (R26.2);Muscle weakness (generalized) (M62.81);Adult, failure to thrive (R62.7)    Time: 1157-1223 PT Time Calculation (min) (ACUTE ONLY): 26 min   Charges:   PT Evaluation $PT Eval Moderate Complexity: 1 Mod (co-tx with OT)          Windell Norfolk, DPT, PN1   Supplemental Physical Therapist [place]    Pager [phone] Acute Rehab Office [phone]

## 2020-04-20 NOTE — Progress Notes (Signed)
PT did not want to wear CPAP.  Pt is still on 6-7L [place]. RT will continue to monitor.

## 2020-04-20 NOTE — TOC Initial Note (Signed)
Transition of Care [facility]) - Initial/Assessment Note    Patient Details  Name: [name] MRN: [id number] Date of Birth: [birth year]  Transition [facility]) CM/SW Contact:    Joanne Chars, LCSW Phone Number: 04/20/2020, 2:10 PM  Clinical Narrative:   CSW met with pt to discuss PT/OT recommendation for SNF placement.  Pt states she is not interested in SNF and wants to return home, but is willing to work with [facility].  Pt reports she lives with her adult daughter and soon to be son-in-law, who provide support.  Pt reports she has recently been working with [facility] but reports they stopped services due to staffing issues. Pt has also worked with [facility] in the past and would like to try them again.  Choice offered and explained.  Pt reports she has her PCP through a [facility] office and that her MD recently left, but she does believe she has another one assigned.  Pt reports she has a walker, cane, and bedside commode already in the home.  Has been using the cane most recently.  Pt is not vaccinated.                  Expected Discharge Plan: [place] Barriers to Discharge: Continued Medical Work up   Patient Goals and CMS Choice Patient states their goals for this hospitalization and ongoing recovery are:: get better CMS Medicare.gov Compare Post Acute Care list provided to:: Patient Choice offered to / list presented to : Patient  Expected Discharge Plan and Services Expected Discharge Plan: [place] Choice: [place] arrangements for the past 2 months: Single Family Home                                      Prior Living Arrangements/Services Living arrangements for the past 2 months: Single Family Home Lives with:: Adult Children (daughter, soon to be son-in-law) Patient language and need for interpreter reviewed:: Yes Do you feel safe going back to the place where you live?: Yes      Need  for Family Participation in Patient Care: No (Comment) Care giver support system in place?: Yes (comment)   Criminal Activity/Legal Involvement Pertinent to Current Situation/Hospitalization: No - Comment as needed  Activities of Daily Living Home Assistive Devices/Equipment: Cane (specify quad or straight) ADL Screening (condition at time of admission) Patient's cognitive ability adequate to safely complete daily activities?: Yes Is the patient deaf or have difficulty hearing?: No Does the patient have difficulty seeing, even when wearing glasses/contacts?: No Does the patient have difficulty concentrating, remembering, or making decisions?: No Patient able to express need for assistance with ADLs?: No Does the patient have difficulty dressing or bathing?: Yes Independently performs ADLs?: No Communication: Independent Dressing (OT): Dependent Is this a change from baseline?: Pre-admission baseline Grooming: Needs assistance Is this a change from baseline?: Pre-admission baseline Feeding: Independent Bathing: Dependent Is this a change from baseline?: Pre-admission baseline Toileting: Dependent Is this a change from baseline?: Pre-admission baseline In/Out Bed: Independent with device (comment) Is this a change from baseline?: Pre-admission baseline Walks in Home: Independent with device (comment) (cane) Is this a change from baseline?: Pre-admission baseline Does the patient have difficulty walking or climbing stairs?: Yes Weakness of Legs: Both Weakness of Arms/Hands: None  Permission Sought/Granted Permission sought to share information  with : Customer service manager, Family Supports Permission granted to share information with : Yes, Verbal Permission Granted  Share Information with NAME: Maximino Greenland  Permission granted to share info w AGENCY: [place] granted to share info w Relationship: daughter     Emotional Assessment Appearance:: Appears stated  age Attitude/Demeanor/Rapport: Engaged Affect (typically observed): Pleasant Orientation: : Oriented to Self, Oriented to Place, Oriented to  Time, Oriented to Situation Alcohol / Substance Use: Not Applicable Psych Involvement: No (comment)  Admission diagnosis:  Elevated LFTs [R79.89] Bilateral lower leg cellulitis [L03.116, L03.115] Acute hypercapnic respiratory failure (HCC) [J96.02] Sepsis with acute hypoxic respiratory failure without septic shock, due to unspecified organism ([place]) [A41.9, R65.20, J96.01] Patient Active Problem List   Diagnosis Date Noted  . Sepsis due to cellulitis ([place]) 04/18/2020  . Venous stasis dermatitis of both lower extremities 02/25/2019  . Encounter for preventive care 10/20/2018  . Chronic respiratory failure with hypoxia and hypercapnia ([place]) 10/06/2018  . Polycythemia, secondary 10/05/2018  . Obesity hypoventilation syndrome ([place]) 10/04/2018  . BMI 45.0-49.9, adult ([place])   . Bilateral lower extremity edema   . Venous stasis dermatitis 09/30/2018  . Wound of lower extremity, bilateral 09/30/2018  . Chronic heart failure with preserved ejection fraction with moderate RV failure 06/09/2017  . GERD (gastroesophageal reflux disease) 11/13/2014  . Opiate-induced constipation 11/13/2014  . History of tobacco abuse 03/10/2014  . Chronic back pain secondary to traumatic L1 fracture 09/29/2013  . Chronic idiopathic thrombocytopenia ([place]) 08/29/2013  . HTN (hypertension)    PCP:  [name], MD Pharmacy:   [facility] [place], [place] - [address] AT [address] [place] [address] Phone: [phone] Fax: [phone]     Social Determinants of Health (SDOH) Interventions    Readmission Risk Interventions No flowsheet data found.

## 2020-04-20 NOTE — Progress Notes (Signed)
Took Dreamstation to patient's room along with a nasal mask, since patient does not use one at home.  Patient stated that she would not be able to use the machine because she gets extremely claustrophobic, and was barely able to tolerate [place].  I left the machine in the room and informed the RN.  Asked RN to call me if patient changed her mind and wanted to try it.

## 2020-04-21 ENCOUNTER — Inpatient Hospital Stay (HOSPITAL_COMMUNITY): Payer: Medicare PPO

## 2020-04-21 DIAGNOSIS — A419 Sepsis, unspecified organism: Principal | ICD-10-CM

## 2020-04-21 DIAGNOSIS — L0390 Cellulitis, unspecified: Secondary | ICD-10-CM

## 2020-04-21 DIAGNOSIS — J9611 Chronic respiratory failure with hypoxia: Secondary | ICD-10-CM

## 2020-04-21 DIAGNOSIS — I5032 Chronic diastolic (congestive) heart failure: Secondary | ICD-10-CM

## 2020-04-21 DIAGNOSIS — J9612 Chronic respiratory failure with hypercapnia: Secondary | ICD-10-CM

## 2020-04-21 DIAGNOSIS — E662 Morbid (severe) obesity with alveolar hypoventilation: Secondary | ICD-10-CM

## 2020-04-21 DIAGNOSIS — J9621 Acute and chronic respiratory failure with hypoxia: Secondary | ICD-10-CM

## 2020-04-21 DIAGNOSIS — J9622 Acute and chronic respiratory failure with hypercapnia: Secondary | ICD-10-CM

## 2020-04-21 LAB — BLOOD GAS, ARTERIAL
Acid-Base Excess: 16.2 mmol/L — ABNORMAL HIGH (ref 0.0–2.0)
Acid-Base Excess: 17.9 mmol/L — ABNORMAL HIGH (ref 0.0–2.0)
Bicarbonate: 44.5 mmol/L — ABNORMAL HIGH (ref 20.0–28.0)
Bicarbonate: 44.5 mmol/L — ABNORMAL HIGH (ref 20.0–28.0)
Drawn by: 28338
Drawn by: 24686
FIO2: 60
FIO2: 50
O2 Saturation: 96.9 %
O2 Saturation: 95.8 %
Patient temperature: 36.7
Patient temperature: 37
pCO2 arterial: 114 mmHg (ref 32.0–48.0)
pCO2 arterial: 85 mmHg (ref 32.0–48.0)
pH, Arterial: 7.213 — ABNORMAL LOW (ref 7.350–7.450)
pH, Arterial: 7.339 — ABNORMAL LOW (ref 7.350–7.450)
pO2, Arterial: 89.8 mmHg (ref 83.0–108.0)
pO2, Arterial: 75.2 mmHg — ABNORMAL LOW (ref 83.0–108.0)

## 2020-04-21 LAB — COMPREHENSIVE METABOLIC PANEL
ALT: 43 U/L (ref 0–44)
AST: 15 U/L (ref 15–41)
Albumin: 3.3 g/dL — ABNORMAL LOW (ref 3.5–5.0)
Alkaline Phosphatase: 80 U/L (ref 38–126)
Anion gap: 11 (ref 5–15)
BUN: 18 mg/dL (ref 8–23)
CO2: 39 mmol/L — ABNORMAL HIGH (ref 22–32)
Calcium: 8.6 mg/dL — ABNORMAL LOW (ref 8.9–10.3)
Chloride: 92 mmol/L — ABNORMAL LOW (ref 98–111)
Creatinine, Ser: 0.51 mg/dL (ref 0.44–1.00)
GFR calc Af Amer: >60 mL/min (ref >60)
GFR calc non Af Amer: >60 mL/min (ref >60)
Glucose, Bld: 110 mg/dL — ABNORMAL HIGH (ref 70–99)
Potassium: 3.8 mmol/L (ref 3.5–5.1)
Sodium: 142 mmol/L (ref 135–145)
Total Bilirubin: 1 mg/dL (ref 0.3–1.2)
Total Protein: 6 g/dL — ABNORMAL LOW (ref 6.5–8.1)

## 2020-04-21 LAB — CULTURE, BLOOD (ROUTINE X 2)

## 2020-04-21 LAB — URINE CULTURE: Culture: 60000 — AB

## 2020-04-21 LAB — GLUCOSE, CAPILLARY
Glucose-Capillary: 115 mg/dL — ABNORMAL HIGH (ref 70–99)
Glucose-Capillary: 122 mg/dL — ABNORMAL HIGH (ref 70–99)
Glucose-Capillary: 106 mg/dL — ABNORMAL HIGH (ref 70–99)
Glucose-Capillary: 103 mg/dL — ABNORMAL HIGH (ref 70–99)
Glucose-Capillary: 89 mg/dL (ref 70–99)

## 2020-04-21 LAB — CBC
HCT: 41.5 % (ref 36.0–46.0)
Hemoglobin: 12.2 g/dL (ref 12.0–15.0)
MCH: 28.3 pg (ref 26.0–34.0)
MCHC: 29.4 g/dL — ABNORMAL LOW (ref 30.0–36.0)
MCV: 96.3 fL (ref 80.0–100.0)
Platelets: 54 10*3/uL — ABNORMAL LOW (ref 150–400)
RBC: 4.31 MIL/uL (ref 3.87–5.11)
RDW: 15.5 % (ref 11.5–15.5)
WBC: 18 10*3/uL — ABNORMAL HIGH (ref 4.0–10.5)
nRBC: 0 % (ref 0.0–0.2)

## 2020-04-21 MED ORDER — FUROSEMIDE 10 MG/ML IJ SOLN
40.0000 mg | Freq: Two times a day (BID) | INTRAMUSCULAR | Status: DC
  Administered 2020-04-21 – 2020-04-23 (×4): 40 mg via INTRAVENOUS
  Filled 2020-04-21 (×4): qty 4

## 2020-04-21 MED ORDER — FUROSEMIDE 10 MG/ML IJ SOLN
40.0000 mg | Freq: Once | INTRAMUSCULAR | Status: AC
  Administered 2020-04-21: 40 mg via INTRAVENOUS
  Filled 2020-04-21: qty 4

## 2020-04-21 NOTE — Consult Note (Signed)
NAME:  LUCIANN GOSSETT, MRN:  [id number], DOB:  1951-02-20, LOS: 3 ADMISSION DATE:  04/18/2020, CONSULTATION DATE:  04/21/20 REFERRING MD:  Axel Filler, *  CHIEF COMPLAINT:  shortness of breath   Brief History   Ms.Waidelich is a 69 yo F w/ PMH of OSH/OSA, diastolic heart failure, chronic ITP, chronic venous stasis wounds and chronic hypoxic/hypercabic respiratory failure admit for sepsis due to LLE cellulitis whop developed AMS 8/7 AM whom we are consulted for hypercarbic respiratory failure.   History of present illness   Patient is easily arousable but a bit confused at time of evaluation. History obtained via chart. Admitted with LLE cellulitis. Hypoxemic above baseline 2L [place] requiring NIPPV at admission. Some small improvement over time. On vanc/cefepime/flagyl. Slow improvement and de-escalation to vanc/CTX/flagyl 8/6. Per primary team had not been wearing nocturnal NIPPV as ordered. Had worsening respiratory status with AMS this morning. ABG demonstrated acute on chronic hypercarbic respiratory failure with pCO2 114 (baseline in mid 70s). She was started on BiPAP. Slow to no clinical improvement prompting rapid response and PCCM consultation given respiratory failure.  At time of evaluation patient sleeping but arousable to voice. Following commands in all extremities. Oriented to person and place. Repeat ABG obtained at time of evaluation with improved hypercarbia to pCO2 85 on BiPAP.   Micro Data:  8/4 blood cx 1 of 2 diptheroids  8/4 urine cx aerococcus sp.  Antimicrobials:  Vanc 8/4 >> Flagyl 8/4 >> Cefepime 8/4 >> 8/6 CTX 8/6 >>  Interim history/subjective:  As above  Objective   Blood pressure (!) 143/45, pulse (!) 108, temperature 98.3 F (36.8 C), temperature source Oral, resp. rate (!) 28, height 5' (1.524 m), weight 111.8 kg, SpO2 93 %.    FiO2 (%):  [35 %-50 %] 50 %   Intake/Output Summary (Last 24 hours) at 04/21/2020 1348 Last data filed at 04/21/2020  0555 Gross per 24 hour  Intake --  Output 1700 ml  Net -1700 ml   Filed Weights   04/19/20 1700 04/20/20 0708 04/21/20 0651  Weight: 84.6 kg 111.6 kg 111.8 kg    Examination: General: chronically ill appearing, sleepy Eyes: EOMI, no icterus Lungs: coarse distant sounds bilaterally,  Cardiovascular: tachy, no murmur Extremities: chronic skin changes, bilateral LEs wrapped  Resolved Hospital Problem list   n/a  Assessment & Plan:  Acute on chronic hypoxemic and hypercarbic respiratory failure: Has 2L O2 requirement at home and elevated bicarb on BMPs to suggest OHS as a chronic cause. Her CXR on admission and in the past year with chronic volume overload. Suspect acute decompensation due to worsened edema and fluid overload given CXR appearance 8/7 (day of transfer). --aggressive diuresis, goal negative 2L (dosed 40 mg IV furosemide administered noon) --Continue BiPAP  Diastolic heart failure: --Diurese  Disposition: Given improved mentation and blood gas, continue progressive care  Labs   CBC: Recent Labs  Lab 04/18/20 2015 04/18/20 2300 04/19/20 0224 04/19/20 0341 04/19/20 0618 04/20/20 0237 04/21/20 0047  WBC 21.8*  --   --  22.1*  --  18.3* 18.0*  NEUTROABS 20.1*  --   --  20.9*  --   --   --   HGB 13.5   < > 12.9 12.1 12.6 11.0* 12.2  HCT 44.6   < > 38.0 40.2 37.0 38.1 41.5  MCV 92.1  --   --  94.4  --  96.5 96.3  PLT 59*  --   --  50*  --  50* 54*   < > = values in this interval not displayed.    Basic Metabolic Panel: Recent Labs  Lab 04/18/20 2015 04/18/20 2300 04/19/20 0224 04/19/20 0341 04/19/20 0618 04/20/20 0237 04/21/20 0047  NA 131*   < > 140 140 139 143 142  K 3.7   < > 3.7 3.5 3.5 3.6 3.8  CL 84*  --   --  93*  --  95* 92*  CO2 37*  --   --  38*  --  38* 39*  GLUCOSE 148*  --   --  210*  --  114* 110*  BUN 18  --   --  17  --  19 18  CREATININE 0.73  --   --  0.73  --  0.55 0.51  CALCIUM 8.6*  --   --  8.5*  --  8.5* 8.6*  MG  --   --    --  1.8  --   --   --   PHOS  --   --   --  3.7  --   --   --    < > = values in this interval not displayed.   GFR: Estimated Creatinine Clearance: 77.6 mL/min (by C-G formula based on SCr of 0.51 mg/dL). Recent Labs  Lab 04/18/20 1935 04/18/20 2015 04/19/20 0341 04/20/20 0237 04/21/20 0047  WBC  --  21.8* 22.1* 18.3* 18.0*  LATICACIDVEN 1.2  --   --   --   --     Liver Function Tests: Recent Labs  Lab 04/18/20 2015 04/19/20 0341 04/20/20 0237 04/21/20 0047  AST 55* 54* 23 15  ALT 54* 71* 47* 43  ALKPHOS 127* 104 82 80  BILITOT 2.0* 1.1 0.6 1.0  PROT 6.7 5.8* 5.6* 6.0*  ALBUMIN 4.0 3.3* 2.9* 3.3*   No results for input(s): LIPASE, AMYLASE in the last 168 hours. No results for input(s): AMMONIA in the last 168 hours.  ABG    Component Value Date/Time   PHART 7.339 (L) 04/21/2020 1315   PCO2ART 85.0 (HH) 04/21/2020 1315   PO2ART 75.2 (L) 04/21/2020 1315   HCO3 44.5 (H) 04/21/2020 1315   TCO2 45 (H) 04/19/2020 0618   O2SAT 95.8 04/21/2020 1315     Coagulation Profile: Recent Labs  Lab 04/18/20 2015  INR 1.1    Cardiac Enzymes: Recent Labs  Lab 04/20/20 0237  CKTOTAL 10*    HbA1C: Hgb A1c MFr Bld  Date/Time Value Ref Range Status  04/19/2020 03:41 AM 6.1 (H) 4.8 - 5.6 % Final    Comment:    (NOTE) Pre diabetes:          5.7%-6.4%  Diabetes:              >6.4%  Glycemic control for   <7.0% adults with diabetes     CBG: Recent Labs  Lab 04/20/20 1555 04/20/20 2057 04/21/20 0733 04/21/20 0959 04/21/20 1155  GLUCAP 143* 135* 115* 122* 106*    Review of Systems:   Unobtainable due to patient factors  Past Medical History  She,  has a past medical history of Arthritis, Back pain (09/29/2013), Blood dyscrasia, Chest pain, Chronic kidney disease, Depression, Dysrhythmia, Fracture of vertebra, compression (HCC), GERD (gastroesophageal reflux disease), History of kidney stones, HTN (hypertension), ITP (idiopathic thrombocytopenic purpura)  (08/29/2013), Leukocytosis, unspecified (09/05/2013), Muscle cramp (11/16/2013), and Tachycardia.   Surgical History    Past Surgical History:  Procedure Laterality Date  . ABDOMINAL HYSTERECTOMY    .  ANTERIOR LAT LUMBAR FUSION N/A 03/28/2014   Procedure: Anterolateral decompression of L1 fracture with posterior segmental stabilization;  Surgeon: Kristeen Miss, MD;  Location: [facility];  Service: Neurosurgery;  Laterality: N/A;  Anterolateral decompression of Lumbar One fracture with posterior segmental stabilization  . CESAREAN SECTION     x2  . CHEST TUBE INSERTION Left 03/28/2014   Procedure: CHEST TUBE INSERTION;  Surgeon: Ivin Poot, MD;  Location: [facility];  Service: Thoracic;  Laterality: Left;  . INCISIONAL HERNIA REPAIR N/A 11/13/2014   Procedure: PRIMARY REPAIR INCARCERATED INCISIONAL HERNIA WITH PLACEMENT BIOLOGIC MESH;  Surgeon: Rolm Bookbinder, MD;  Location: [facility];  Service: General;  Laterality: N/A;  . LAPAROTOMY Right 09   ovarian cyst  . TOTAL ABDOMINAL HYSTERECTOMY W/ BILATERAL SALPINGOOPHORECTOMY  68     Social History   reports that she quit smoking about 5 years ago. Her smoking use included cigarettes. She has a 15.00 pack-year smoking history. She has never used smokeless tobacco. She reports current alcohol use. She reports that she does not use drugs.   Family History   Her family history includes Heart attack in her mother and another family member; Heart disease in her father; Hypertension in her mother and another family member; Leukemia in her paternal uncle.   Allergies No Known Allergies   Home Medications  Prior to Admission medications   Medication Sig Start Date End Date Taking? Authorizing Provider  amLODipine (NORVASC) 5 MG tablet Take 1 tablet (5 mg total) by mouth daily. 06/21/17  Yes Caren Griffins, MD  carvedilol (COREG) 6.25 MG tablet Take 1 tablet (6.25 mg total) by mouth 2 (two) times daily with a meal. 06/20/17  Yes Gherghe,  Costin M, MD  diphenhydrAMINE-APAP, sleep, (TYLENOL PM EXTRA STRENGTH PO) Take 2 tablets by mouth at bedtime.    Yes [provider]  furosemide (LASIX) 80 MG tablet TAKE 1 TABLET BY MOUTH DAILY; 1 TABLET TWICE DAILY IF YOU GAIN 3 POUNDS IN 1 WEEK OR 5 POUNDS IN 1 DAY Patient taking differently: Take 80 mg by mouth See admin instructions. Take 80 mg by mouth once a day and increase to 80 mg two times a day if either 3 pounds are gained in a week OR 5 pounds in one day 03/28/20  Yes Aslam, Sadia, MD  lisinopril (PRINIVIL,ZESTRIL) 2.5 MG tablet Take 1 tablet (2.5 mg total) by mouth daily. 06/21/17  Yes Gherghe, Vella Redhead, MD  oxyCODONE-acetaminophen (PERCOCET) 10-325 MG tablet Take 1 tablet by mouth 3 (three) times daily.    Yes [provider]  polyethylene glycol (MIRALAX / GLYCOLAX) 17 g packet Take 17 g by mouth daily as needed for mild constipation ([place]).    Yes [provider]  potassium chloride (K-DUR) 10 MEQ tablet Take 1 tablet (10 mEq total) by mouth daily. 06/20/17  Yes Caren Griffins, MD  promethazine (PHENERGAN) 12.5 MG tablet Take 1 tablet (12.5 mg total) by mouth every 6 (six) hours as needed for nausea or vomiting. 10/12/18  Yes Carroll Sage, MD  senna-docusate (SENOKOT S) 8.6-50 MG tablet Take 2 tablets by mouth 2 (two) times daily. Patient taking differently: Take 2 tablets by mouth 2 (two) times daily as needed for mild constipation or moderate constipation.  10/20/18  Yes Santos-Sanchez, Merlene Morse, MD  Sennosides (EX-LAX PO) Take 1 tablet by mouth as needed (for constipation).    Yes [provider]  tiZANidine (ZANAFLEX) 2 MG tablet Take 2 mg  by mouth every 6 (six) hours as needed for muscle spasms.  04/12/20  Yes [provider]  baclofen 5 MG TABS Take one tablet three times per day for 3 days. If spasms do not improve, take 2 tablets three times per day. If still having spasms on 2 tablets three times daily, please call the clinic.  04/11/20   Harvie Heck, MD     Critical care time: 32 minutes     Critical care time was exclusive of separately billable procedures and treating other patients.  Critical care was necessary to treat or prevent imminent or life-threatening deterioration.  Critical care was time spent personally by me on the following activities: development of treatment plan with patient and/or surrogate as well as nursing, discussions with consultants, evaluation of patient's response to treatment, examination of patient, obtaining history from patient or surrogate, ordering and performing treatments and interventions, ordering and review of laboratory studies, ordering and review of radiographic studies, pulse oximetry, re-evaluation of patient's condition and participation in multidisciplinary rounds.

## 2020-04-21 NOTE — Progress Notes (Signed)
   04/21/20 1041  BiPAP/CPAP/SIPAP  $ Non-Invasive Ventilator  Non-Invasive Vent Set Up;Non-Invasive Vent Initial  BiPAP/CPAP/SIPAP Pt Type Adult  Mask Type Full face mask  Mask Size Medium  Set Rate 8 breaths/min  Respiratory Rate 32 breaths/min  IPAP 12 cmH20  EPAP 6 cmH2O  Oxygen Percent 35 %  Minute Ventilation 7.2  Leak 28  Peak Inspiratory Pressure (PIP) 14  Tidal Volume (Vt) 416  BiPAP/CPAP/SIPAP BiPAP  Press High Alarm 25 cmH2O  Press Low Alarm 5 cmH2O  BiPAP/CPAP /SiPAP Vitals  Pulse Rate (!) 112  Resp (!) 32  SpO2 95 %  MEWS Score/Color  MEWS Score 4  MEWS Score Color Red  Pt placed on Bipap on above setting per MD order due to increased respiratory distress. Pt tolerating well at this time RT will continue to monitor. ABG obtained.

## 2020-04-21 NOTE — Progress Notes (Signed)
During initial assessment this morning at 0930, pt was alert and oriented * 2. Pt was bathed and sat up semi-fowler position to help improve breathing but pt continued to decline. Contacted Rapid response RN, RT and assigned physician on sticky notes via page. Dr Truman Hayward showed up at bedside and placed new orders. Pt is currently on BIPAP. Monitoring pt and update treatment as needed.

## 2020-04-21 NOTE — Progress Notes (Signed)
Paged by nursing staff regarding worsening vitals. Examined and evaluated at bedside. Noted to have hypotension w/ Bp 90/40, O2 sat down to 88s on Bipap. Abg prior to Bipap use reviewed w/ pH 7.213, pCO2 114, pO2 90, Biacrb 44.5. Likely having worsening acute on chronic hypercapnic respiratory failure. Chest X-ray showing worsening pulmonary edema and infiltrates. Discussed case with PCCM. Plan to transfer to ICU for closer monitoring.

## 2020-04-21 NOTE — Progress Notes (Signed)
   Subjective:  Ms.Cecilio is a 69 yo F w/ PMH of OSH/OSA, diastolic heart failure, chronic ITP, chronic venous stasis wounds and chronic hypoxic/hypercabic respiratory failure admit for sepsis due to LLE cellulitis.  Ms.Voris was examined and evaluated at bedside. She was noted to be AAOx0 with response to verbal stimulation but unable to follow directions. Examined with nursing staff at bedside who states she did not get BiPAP overnight due.  Objective:  Vital signs in last 24 hours: Vitals:   04/21/20 1041 04/21/20 1131 04/21/20 1220 04/21/20 1235  BP:   (!) 101/53   Pulse: (!) 112  (!) 109 (!) 106  Resp: 20  (!) 42 (!) 21  Temp:   98.3 F (36.8 C)   TempSrc:   Oral   SpO2: 95% 94% (!) 85% 93%  Weight:      Height:       Physical Exam: Gen: ill-appearing, somnolent, GCS 10 CV: RRR, S1, S2 wnl Pulm: Bibasilar rales, + accessory muscle use Abd: Soft, BS+, NTND, No rebound, no guarding Extm: Bilateral lower extremities wrapped in gauze. Skin: Diaphoretic  Assessment/Plan:  Principal Problem:   Sepsis due to [facility]ulitis [facility]) Active Problems:   Chronic idiopathic thrombocytopenia (HCC)   Chronic heart failure with preserved ejection fraction with moderate RV failure   Wound of lower extremity, bilateral   Obesity hypoventilation syndrome (HCC)   Chronic respiratory failure with hypoxia and hypercapnia (HCC)  #Acute hypercapnic, hypoxic respiratory failure #Obesity hypoventilation syndrome # Acute on chronic diastolic heart failure Worsening respiratory status with desaturation during high flow. BiPAP ordered for night time but she refused. Currently altered likely due to worsening hypercapnia and acute on chronic respiratory failure. Exam also shows worsening respiratory distress likely in part due to acute on chronic diastolic heart failure as she is hypervolemic on exam -Stat ABG, Chest X-ray - Continuous BIPAP -DuoNebs q4 whil awake - IV lasix 40mg    #Sepsis 2/2  LLE cellulitis Patient remains afebrile, HDS. Leukocytosis downtrending 18.3->18.0 BCx 1/2 growing diphtheroids. Likely contaminant. On day 4 of IV antibiotics -Vanc, ceftriaxone (8/4-) -Flagyl (8/5-8/6) -Appreciate WOC for assistance  #Mild Transaminitis AST, ALT improved from yesterday. RUQ w/ fatty liver, lipid panel normal. Will continue to trend after IV lasix today. -Hepatitis labs neg -Pending alpha-1-antitrypsin -CMP trend  #ITP No active signs of bleeding. PLT 50->54. Will continue DVT prophylaxis given platelets stay at least 50,000. -Lovenox 40mg  QD  #Disability 2/2 morbid obesity Continue work with PT, OT, SW for ideal placement and plan moving forward when patient is medically ready for discharge. It appears patient has poor insight into extent of medication condition, will need continuing discussion with patient on best path towards recovery. -Per PT/OT - SNF, supervision/assistance 24hr  #HTN BP stable, continue home meds -Carvedilol 6.25mg  BID  #Chronic back pain Continue pain regimen. Given patient's respiratory status will monitor opioids. -Percocet 5mg  q8 PRN -Tylenol 325mg  TID PRN -Miralax for opioid-induced constipation  Diet: HH IVF: n/a DVT PPX: Lovenox 40mg  QD Code Status: Full Prior to Admission Living Arrangement: home Anticipated Discharge Location: SNF per PT/OT Barriers to Discharge: hypoxia  Dispo: Anticipated discharge in approximately 2-3 day(s).   Mosetta Anis, MD 04/21/2020, 12:42 PM Pager: [phone] After 5pm on weekdays and 1pm on weekends: On Call pager [phone]

## 2020-04-21 NOTE — Significant Event (Signed)
Rapid Response Event Note   Reason for Call : Received a call from the LPN taking care of the patient stating that the patient is having increased trouble breathing.     Initial Focused Assessment:  When walking into the room the patient had been transferred from 10 [facility] to [place].  She did not appear to be having difficulty breathing but her O2 sat was reading in the mid 80s.  She was not able to answer orientation questions properly.  Her pupils are 4, equal, and reactive.  Her lungs sounded diminished.  The patient is twitching her left shoulder, which according to the LPN is a new development.  She continues to make moan-like noises from under the bipap mask.  She did follow commands with cueing.    BP 131/63 Pulse 109 O2 94 RR 22 CBG 106    Interventions:  Teaching service was called to bedside, Dr. Truman Hayward is paging PCCM, we have orders to repeat ABG at 13, increased her O2 level to 50% on bipap, took vitals.   Plan of Care:  Awaiting new orders from MD.  Will continue to keep patient on bipap.  Repeat Abg is scheduled for 1300.   PCCM is putting in transfer orders to ICU level    Event Summary:   MD Notified:  Dr. Truman Hayward  Call Time: Arrival Time: 1215 End Time:  Venetia Maxon, RN

## 2020-04-21 NOTE — Progress Notes (Signed)
BiPap settings increased to 16/8 due to ABG results.

## 2020-04-21 NOTE — Progress Notes (Signed)
Pt remains on Bipap and demonstrating improvement in alertness and arousability. Remains confused to the situation and year but is oriented to self and place. Pt not exhibiting signs of increased work of breathing and VSS on current Bipap settings. Care discussed with Dr. Tamala Julian and plan to keep pt on 2W progressive care at this time.

## 2020-04-22 DIAGNOSIS — A419 Sepsis, unspecified organism: Secondary | ICD-10-CM | POA: Diagnosis not present

## 2020-04-22 DIAGNOSIS — L8990 Pressure ulcer of unspecified site, unspecified stage: Secondary | ICD-10-CM | POA: Insufficient documentation

## 2020-04-22 DIAGNOSIS — L0390 Cellulitis, unspecified: Secondary | ICD-10-CM | POA: Diagnosis not present

## 2020-04-22 LAB — COMPREHENSIVE METABOLIC PANEL
ALT: 32 U/L (ref 0–44)
AST: 14 U/L — ABNORMAL LOW (ref 15–41)
Albumin: 3 g/dL — ABNORMAL LOW (ref 3.5–5.0)
Alkaline Phosphatase: 68 U/L (ref 38–126)
Anion gap: 14 (ref 5–15)
BUN: 18 mg/dL (ref 8–23)
CO2: 43 mmol/L — ABNORMAL HIGH (ref 22–32)
Calcium: 8.4 mg/dL — ABNORMAL LOW (ref 8.9–10.3)
Chloride: 87 mmol/L — ABNORMAL LOW (ref 98–111)
Creatinine, Ser: 0.58 mg/dL (ref 0.44–1.00)
GFR calc Af Amer: >60 mL/min (ref >60)
GFR calc non Af Amer: >60 mL/min (ref >60)
Glucose, Bld: 91 mg/dL (ref 70–99)
Potassium: 3.2 mmol/L — ABNORMAL LOW (ref 3.5–5.1)
Sodium: 144 mmol/L (ref 135–145)
Total Bilirubin: 1.2 mg/dL (ref 0.3–1.2)
Total Protein: 5.6 g/dL — ABNORMAL LOW (ref 6.5–8.1)

## 2020-04-22 LAB — GLUCOSE, CAPILLARY
Glucose-Capillary: 90 mg/dL (ref 70–99)
Glucose-Capillary: 107 mg/dL — ABNORMAL HIGH (ref 70–99)
Glucose-Capillary: 103 mg/dL — ABNORMAL HIGH (ref 70–99)
Glucose-Capillary: 179 mg/dL — ABNORMAL HIGH (ref 70–99)
Glucose-Capillary: 119 mg/dL — ABNORMAL HIGH (ref 70–99)

## 2020-04-22 LAB — BLOOD GAS, ARTERIAL
Acid-Base Excess: 22.8 mmol/L — ABNORMAL HIGH (ref 0.0–2.0)
Bicarbonate: 49 mmol/L — ABNORMAL HIGH (ref 20.0–28.0)
Drawn by: 51155
FIO2: 70
O2 Saturation: 98.7 %
Patient temperature: 37.9
pCO2 arterial: 75.2 mmHg (ref 32.0–48.0)
pH, Arterial: 7.431 (ref 7.350–7.450)
pO2, Arterial: 105 mmHg (ref 83.0–108.0)

## 2020-04-22 LAB — CBC
HCT: 38.9 % (ref 36.0–46.0)
Hemoglobin: 11.6 g/dL — ABNORMAL LOW (ref 12.0–15.0)
MCH: 28.5 pg (ref 26.0–34.0)
MCHC: 29.8 g/dL — ABNORMAL LOW (ref 30.0–36.0)
MCV: 95.6 fL (ref 80.0–100.0)
Platelets: 58 10*3/uL — ABNORMAL LOW (ref 150–400)
RBC: 4.07 MIL/uL (ref 3.87–5.11)
RDW: 14.7 % (ref 11.5–15.5)
WBC: 10.1 10*3/uL (ref 4.0–10.5)
nRBC: 0 % (ref 0.0–0.2)

## 2020-04-22 MED ORDER — POTASSIUM CHLORIDE CRYS ER 20 MEQ PO TBCR
40.0000 meq | EXTENDED_RELEASE_TABLET | Freq: Two times a day (BID) | ORAL | Status: DC
  Filled 2020-04-22 (×2): qty 2

## 2020-04-22 MED ORDER — WHITE PETROLATUM EX OINT
TOPICAL_OINTMENT | CUTANEOUS | Status: AC
  Filled 2020-04-22: qty 28.35

## 2020-04-22 MED ORDER — IPRATROPIUM-ALBUTEROL 0.5-2.5 (3) MG/3ML IN SOLN
3.0000 mL | Freq: Three times a day (TID) | RESPIRATORY_TRACT | Status: DC
  Administered 2020-04-22 (×3): 3 mL via RESPIRATORY_TRACT
  Filled 2020-04-22 (×3): qty 3

## 2020-04-22 MED ORDER — POTASSIUM CHLORIDE 10 MEQ/100ML IV SOLN
10.0000 meq | INTRAVENOUS | Status: AC
  Administered 2020-04-22 (×3): 10 meq via INTRAVENOUS
  Filled 2020-04-22 (×3): qty 100

## 2020-04-22 NOTE — Progress Notes (Signed)
Paged by RN because patient was unarousable, RT was also notified prior to this an obtained an ABG and changed Bipap settings. RT noted patient was not breathing over the set rate of 8 and was earlier in the night. RT increased FIO2 from 50% to 70% , IPAP from 18 to 20, and set rate from 8 to 20. On my exam patient was arousable and following commands. She was alert to person, place, her age , and uncertain of the year.    Arterial Blood Gas result:  pO2 105; pCO2 75.2; pH 7.43;  HCO3 49, %O2 Sat 98.7. Blood pressure (!) 154/80, pulse 95, temperature 100.3 F (37.9 C), temperature source Axillary, resp. rate (!) 23, height 5' (1.524 m), weight 111.8 kg, SpO2 96 %.  A/P: Acute on Chronic Hypercapnic Respiratory Failure - After ABG results, patient was turned back down to FIO2 50%. - no further workup or changes at this time

## 2020-04-22 NOTE — Significant Event (Addendum)
Rapid Response Event Note   Reason for Call :  Called d/t decreased LOC and SpO2-80s on .50 bipap.  Initial Focused Assessment:  Pt laying in bed with eyes closed. Pt is very hard to arouse. With painful stimulation, she will open eyes, move all extremities, and follow commands. She goes back to sleep very easily with no stimulation. Pupils 4 and brisk. Lungs diminished t/o. Skin warm and dry. T-100.3, HR-96, BP-147/75, RR-22, SpO-95% on .70 bipap.  Prior to my arrival, RT made some changes to the bipap settings.  Once MD arrived to room, pt was more awake than previous assessment.     Interventions:  Bipap increased from .50 to .70, IPAP from 18 to 20, and set rate from 8 to 20 prior to my arrival.  ABG-7.43/75.2/105/49   Plan of Care:  Pt is more awake now. ABG is better than previously. Continue to monitor pt closely. Call RRT if further assistance need.    Event Summary:   MD Notified: 0225 Call [place] Time:0220 End Time:0250  Dillard Essex, RN

## 2020-04-22 NOTE — Progress Notes (Signed)
   Subjective:  Ms.Altemose is a 69 yo F w/ PMH of OSH/OSA, diastolic heart failure, chronic ITP, chronic venous stasis wounds and chronic hypoxic/hypercabic respiratory failure admit for sepsis due to LLE cellulitis.  Ms.Wyffels was examined and evaluated at bedside. She was noted to be somnolent and is moaning. Unable to follow directions but does awaken to verbal response.  Objective:  Vital signs in last 24 hours: Vitals:   04/22/20 1141 04/22/20 1200 04/22/20 1501 04/22/20 1502  BP:  (!) 148/69    Pulse: 86 87    Resp: (!) 23 (!) 26    Temp:  97.9 F (36.6 C)    TempSrc:  Oral    SpO2: 96% 97% 96% 92%  Weight:      Height:       Physical Exam: Gen: Ill-appearing, somnolent CV: RRR, S1, S2 wnl Pulm: On bipap, improved bilateral rales, no wheezing Extm: Bilateral lower extremities with gauze removed to inspect. Showing continued wide poorly demarcated area w/ erythema, edema, and warmth Skin: Diaphoretic Neuro:GCS 10  Assessment/Plan:  Principal Problem:   Sepsis due to cellulitis [facility]) Active Problems:   Chronic idiopathic thrombocytopenia (HCC)   Chronic heart failure with preserved ejection fraction with moderate RV failure   Wound of lower extremity, bilateral   Obesity hypoventilation syndrome (HCC)   Chronic respiratory failure with hypoxia and hypercapnia (HCC)  #Acute hypercapnic, hypoxic respiratory failure #Obesity hypoventilation syndrome # Acute on chronic diastolic heart failure BIPAP overnight with significant improvement in respiratory acidosis pH 7.21->7.34->7.43. Diuresed 4L overnight with improvement in volume status on exam. Continues to be on BIpap 18/6 w/ FIO2 of 50% - Wean off BIPAP as tolerated - DuoNebs q4 whil awake - C/w IV lasix 40mg  BID  #Sepsis 2/2 LLE cellulitis Remains afebrile. Stable vitals  BCx 1/2 growing diphtheroids. Likely contaminant. On day 5 of IV antibiotics. Leukocytosis continues to improve 18.3->18.0->10.1 -Vanc,  ceftriaxone (8/4-) -Flagyl (8/5-8/6) -Appreciate WOC for assistance  #ITP No active signs of bleeding. PLT 50->54->58. Will continue DVT prophylaxis given platelets stay at least 50,000. -Lovenox 40mg  QD  #Disability 2/2 morbid obesity Continue work with PT, OT, SW for ideal placement and plan moving forward when patient is medically ready for discharge. It appears patient has poor insight into extent of medication condition, will need continuing discussion with patient on best path towards recovery. -Per PT/OT - SNF, supervision/assistance 24hr  #HTN BP stable, continue home meds -Carvedilol 6.25mg  BID  #Chronic back pain Continue pain regimen. Given patient's respiratory status will monitor opioids. -Percocet 5mg  q8 PRN -Tylenol 325mg  TID PRN -Miralax for opioid-induced constipation  Diet: HH IVF: n/a DVT PPX: Lovenox 40mg  QD Code Status: Full Prior to Admission Living Arrangement: home Anticipated Discharge Location: SNF per PT/OT Barriers to Discharge: hypoxia  Dispo: Anticipated discharge in approximately 2-3 day(s).   Mosetta Anis, MD 04/22/2020, 3:04 PM Pager: [phone] After 5pm on weekdays and 1pm on weekends: On Call pager [phone]

## 2020-04-22 NOTE — Consult Note (Signed)
NAME:  [name], MRN:  [id number], DOB:  Jun 24, 1951, LOS: 4 ADMISSION DATE:  04/18/2020, CONSULTATION DATE:  04/22/20 REFERRING MD:  Axel Filler, *  CHIEF COMPLAINT:  shortness of breath   Brief History   [name] is a 69 yo F w/ PMH of OSH/OSA, diastolic heart failure, chronic ITP, chronic venous stasis wounds and chronic hypoxic/hypercabic respiratory failure admit for sepsis due to LLE cellulitis whop developed AMS 8/7 AM whom we are consulted for hypercarbic respiratory failure.   History of present illness   Patient is easily arousable but a bit confused at time of evaluation. History obtained via chart. Admitted with LLE cellulitis. Hypoxemic above baseline 2L [place] requiring NIPPV at admission. Some small improvement over time. On vanc/cefepime/flagyl. Slow improvement and de-escalation to vanc/CTX/flagyl 8/6. Per primary team had not been wearing nocturnal NIPPV as ordered. Had worsening respiratory status with AMS this morning. ABG demonstrated acute on chronic hypercarbic respiratory failure with pCO2 114 (baseline in mid 70s). She was started on BiPAP. Slow to no clinical improvement prompting rapid response and PCCM consultation given respiratory failure.  At time of evaluation patient sleeping but arousable to voice. Following commands in all extremities. Oriented to person and place. Repeat ABG obtained at time of evaluation with improved hypercarbia to pCO2 85 on BiPAP.   Micro Data:  8/4 blood cx 1 of 2 diptheroids  8/4 urine cx aerococcus sp.  Antimicrobials:  Vanc 8/4 >> Flagyl 8/4 >> Cefepime 8/4 >> 8/6 CTX 8/6 >>  Interim history/subjective:  Reports of unresponsiveness, worsening hypoxemia overnight. Remedied with BiPAP adjustments. Feels ok today.  Objective   Blood pressure (!) 159/99, pulse 86, temperature 98.2 F (36.8 C), temperature source Oral, resp. rate (!) 23, height 5' (1.524 m), weight 107.4 kg, SpO2 96 %.    FiO2 (%):  [35 %-50 %] 50  %   Intake/Output Summary (Last 24 hours) at 04/22/2020 1150 Last data filed at 04/22/2020 0300 Gross per 24 hour  Intake --  Output 4200 ml  Net -4200 ml   Filed Weights   04/20/20 0708 04/21/20 0651 04/22/20 0408  Weight: 111.6 kg 111.8 kg 107.4 kg    Examination: General: chronically ill appearing, sleepy Eyes: EOMI, no icterus Lungs: coarse distant sounds bilaterally, decreased air movement Cardiovascular: tachy, no murmur Extremities: chronic skin changes, bilateral LEs wrapped  Resolved Hospital Problem list   n/a  Assessment & Plan:  Acute on chronic hypoxemic and hypercarbic respiratory failure: Has 2L O2 requirement at home and elevated bicarb on BMPs to suggest OHS as a chronic cause. Her CXR on admission and in the past year with chronic volume overload. Suspect acute decompensation due to worsened edema and fluid overload given CXR appearance 8/7 (day of rapid response). --Continue diuresis, goal negative 1L  --PRN BiPAP during day, ok to trial on [place] as tolerated --Strict BiPAP adherence at night    Labs   CBC: Recent Labs  Lab 04/18/20 2015 04/18/20 2300 04/19/20 0341 04/19/20 0618 04/20/20 0237 04/21/20 0047 04/22/20 0114  WBC 21.8*  --  22.1*  --  18.3* 18.0* 10.1  NEUTROABS 20.1*  --  20.9*  --   --   --   --   HGB 13.5   < > 12.1 12.6 11.0* 12.2 11.6*  HCT 44.6   < > 40.2 37.0 38.1 41.5 38.9  MCV 92.1  --  94.4  --  96.5 96.3 95.6  PLT 59*  --  50*  --  50* 54* 58*   < > = values in this interval not displayed.    Basic Metabolic Panel: Recent Labs  Lab 04/18/20 2015 04/18/20 2300 04/19/20 0341 04/19/20 0618 04/20/20 0237 04/21/20 0047 04/22/20 0114  NA 131*   < > 140 139 143 142 144  K 3.7   < > 3.5 3.5 3.6 3.8 3.2*  CL 84*  --  93*  --  95* 92* 87*  CO2 37*  --  38*  --  38* 39* 43*  GLUCOSE 148*  --  210*  --  114* 110* 91  BUN 18  --  17  --  19 18 18   CREATININE 0.73  --  0.73  --  0.55 0.51 0.58  CALCIUM 8.6*  --  8.5*  --  8.5*  8.6* 8.4*  MG  --   --  1.8  --   --   --   --   PHOS  --   --  3.7  --   --   --   --    < > = values in this interval not displayed.   GFR: Estimated Creatinine Clearance: 75.7 mL/min (by C-G formula based on SCr of 0.58 mg/dL). Recent Labs  Lab 04/18/20 1935 04/18/20 2015 04/19/20 0341 04/20/20 0237 04/21/20 0047 04/22/20 0114  WBC  --    < > 22.1* 18.3* 18.0* 10.1  LATICACIDVEN 1.2  --   --   --   --   --    < > = values in this interval not displayed.    Liver Function Tests: Recent Labs  Lab 04/18/20 2015 04/19/20 0341 04/20/20 0237 04/21/20 0047 04/22/20 0114  AST 55* 54* 23 15 14*  ALT 54* 71* 47* 43 32  ALKPHOS 127* 104 82 80 68  BILITOT 2.0* 1.1 0.6 1.0 1.2  PROT 6.7 5.8* 5.6* 6.0* 5.6*  ALBUMIN 4.0 3.3* 2.9* 3.3* 3.0*   No results for input(s): LIPASE, AMYLASE in the last 168 hours. No results for input(s): AMMONIA in the last 168 hours.  ABG    Component Value Date/Time   PHART 7.431 04/22/2020 0228   PCO2ART 75.2 (HH) 04/22/2020 0228   PO2ART 105 04/22/2020 0228   HCO3 49.0 (H) 04/22/2020 0228   TCO2 45 (H) 04/19/2020 0618   O2SAT 98.7 04/22/2020 0228     Coagulation Profile: Recent Labs  Lab 04/18/20 2015  INR 1.1    Cardiac Enzymes: Recent Labs  Lab 04/20/20 0237  CKTOTAL 10*    HbA1C: Hgb A1c MFr Bld  Date/Time Value Ref Range Status  04/19/2020 03:41 AM 6.1 (H) 4.8 - 5.6 % Final    Comment:    (NOTE) Pre diabetes:          5.7%-6.4%  Diabetes:              >6.4%  Glycemic control for   <7.0% adults with diabetes     CBG: Recent Labs  Lab 04/21/20 1621 04/21/20 2149 04/22/20 0220 04/22/20 0715 04/22/20 1141  GLUCAP 103* 89 90 107* 103*    Review of Systems:   Unobtainable due to patient factors  Past Medical History  She,  has a past medical history of Arthritis, Back pain (09/29/2013), Blood dyscrasia, Chest pain, Chronic kidney disease, Depression, Dysrhythmia, Fracture of vertebra, compression (HCC), GERD  (gastroesophageal reflux disease), History of kidney stones, HTN (hypertension), ITP (idiopathic thrombocytopenic purpura) (08/29/2013), Leukocytosis, unspecified (09/05/2013), Muscle cramp (11/16/2013), and Tachycardia.   Surgical History  Past Surgical History:  Procedure Laterality Date  . ABDOMINAL HYSTERECTOMY    . ANTERIOR LAT LUMBAR FUSION N/A 03/28/2014   Procedure: Anterolateral decompression of L1 fracture with posterior segmental stabilization;  Surgeon: Kristeen Miss, MD;  Location: [facility];  Service: Neurosurgery;  Laterality: N/A;  Anterolateral decompression of Lumbar One fracture with posterior segmental stabilization  . CESAREAN SECTION     x2  . CHEST TUBE INSERTION Left 03/28/2014   Procedure: CHEST TUBE INSERTION;  Surgeon: Ivin Poot, MD;  Location: [facility];  Service: Thoracic;  Laterality: Left;  . INCISIONAL HERNIA REPAIR N/A 11/13/2014   Procedure: PRIMARY REPAIR INCARCERATED INCISIONAL HERNIA WITH PLACEMENT BIOLOGIC MESH;  Surgeon: Rolm Bookbinder, MD;  Location: [facility];  Service: General;  Laterality: N/A;  . LAPAROTOMY Right 09   ovarian cyst  . TOTAL ABDOMINAL HYSTERECTOMY W/ BILATERAL SALPINGOOPHORECTOMY  61     Social History   reports that she quit smoking about 5 years ago. Her smoking use included cigarettes. She has a 15.00 pack-year smoking history. She has never used smokeless tobacco. She reports current alcohol use. She reports that she does not use drugs.   Family History   Her family history includes Heart attack in her mother and another family member; Heart disease in her father; Hypertension in her mother and another family member; Leukemia in her paternal uncle.   Allergies No Known Allergies   Home Medications  Prior to Admission medications   Medication Sig Start Date End Date Taking? Authorizing Provider  amLODipine (NORVASC) 5 MG tablet Take 1 tablet (5 mg total) by mouth daily. 06/21/17  Yes Caren Griffins, MD  carvedilol  (COREG) 6.25 MG tablet Take 1 tablet (6.25 mg total) by mouth 2 (two) times daily with a meal. 06/20/17  Yes Gherghe, Costin M, MD  diphenhydrAMINE-APAP, sleep, (TYLENOL PM EXTRA STRENGTH PO) Take 2 tablets by mouth at bedtime.    Yes [provider]  furosemide (LASIX) 80 MG tablet TAKE 1 TABLET BY MOUTH DAILY; 1 TABLET TWICE DAILY IF YOU GAIN 3 POUNDS IN 1 WEEK OR 5 POUNDS IN 1 DAY Patient taking differently: Take 80 mg by mouth See admin instructions. Take 80 mg by mouth once a day and increase to 80 mg two times a day if either 3 pounds are gained in a week OR 5 pounds in one day 03/28/20  Yes Aslam, Sadia, MD  lisinopril (PRINIVIL,ZESTRIL) 2.5 MG tablet Take 1 tablet (2.5 mg total) by mouth daily. 06/21/17  Yes Gherghe, Vella Redhead, MD  oxyCODONE-acetaminophen (PERCOCET) 10-325 MG tablet Take 1 tablet by mouth 3 (three) times daily.    Yes [provider]  polyethylene glycol (MIRALAX / GLYCOLAX) 17 g packet Take 17 g by mouth daily as needed for mild constipation ([place]).    Yes [provider]  potassium chloride (K-DUR) 10 MEQ tablet Take 1 tablet (10 mEq total) by mouth daily. 06/20/17  Yes Caren Griffins, MD  promethazine (PHENERGAN) 12.5 MG tablet Take 1 tablet (12.5 mg total) by mouth every 6 (six) hours as needed for nausea or vomiting. 10/12/18  Yes Carroll Sage, MD  senna-docusate (SENOKOT S) 8.6-50 MG tablet Take 2 tablets by mouth 2 (two) times daily. Patient taking differently: Take 2 tablets by mouth 2 (two) times daily as needed for mild constipation or moderate constipation.  10/20/18  Yes Santos-Sanchez, Merlene Morse, MD  Sennosides (EX-LAX PO) Take 1 tablet by mouth as needed (for constipation).  Yes [provider]  tiZANidine (ZANAFLEX) 2 MG tablet Take 2 mg by mouth every 6 (six) hours as needed for muscle spasms.  04/12/20  Yes [provider]  baclofen 5 MG TABS Take one tablet three times per day for 3 days. If spasms do not  improve, take 2 tablets three times per day. If still having spasms on 2 tablets three times daily, please call the clinic. 04/11/20   Harvie Heck, MD

## 2020-04-22 NOTE — Plan of Care (Signed)
  Problem: Education: Goal: Knowledge of General Education information will improve Description: Including pain rating scale, medication(s)/side effects and non-pharmacologic comfort measures Outcome: Not Progressing   Problem: Health Behavior/Discharge Planning: Goal: Ability to manage health-related needs will improve Outcome: Not Progressing   Problem: Clinical Measurements: Goal: Will remain free from infection Outcome: Not Progressing Goal: Respiratory complications will improve Outcome: Not Progressing   Problem: Activity: Goal: Risk for activity intolerance will decrease Outcome: Not Progressing   Problem: Nutrition: Goal: Adequate nutrition will be maintained Outcome: Not Progressing   Problem: Coping: Goal: Level of anxiety will decrease Outcome: Not Progressing   Problem: Elimination: Goal: Will not experience complications related to bowel motility Outcome: Not Progressing Goal: Will not experience complications related to urinary retention Outcome: Not Progressing   Problem: Pain Managment: Goal: General experience of comfort will improve Outcome: Not Progressing   Problem: Safety: Goal: Ability to remain free from injury will improve Outcome: Not Progressing   Problem: Skin Integrity: Goal: Risk for impaired skin integrity will decrease Outcome: Not Progressing

## 2020-04-22 NOTE — Progress Notes (Signed)
Pt taken off Bipap to give pt a break. Pt placed on 13L HFNC. Sp02 96%, pt looks comfortable at this time.  Bipap on standby.

## 2020-04-22 NOTE — Progress Notes (Signed)
RN responded to patient after telemetry called and informed nurse that o2 at 55%.RN assessed patient and found mentation same as previous assessment but oxygen in 50s on 10L high flow nasal cannula.  Increased high flow to 15L while getting bipap ready to go back on patient (sats up to 70s with high flow turned up to 15).  After putting bipap back on patient slowly came up to 80%. Rapid response called for further assistance with oxygen.  After increasing oxygen settings on bipap- patient o2 back to high 90s.

## 2020-04-22 NOTE — Progress Notes (Addendum)
Rapid Response Rounding Note  Trialed pt on 100% NRB to provide oral care and to administer her PO Coreg. Pt maintained 98-100% on NRB and had no increased work of breathing. Supplemental oxygen provided at 8L HFNC with oxygen saturation of 93% or greater. Pt comfortable in bed and provided short break from BiPAP so that she could telephone her daughter. Angelica Chessman, RN made aware. Recommend replacing BiPAP within an hour to help further correct CO2. Monitor pt for increased work of breathing, change in mental status, and inability to maintain oxygen saturation >93% while off BiPAP.    Addendum: 1000am Pt trialed off BiPAP for one hour. Pt did well and was able to maintain oxygen saturation >93% with no increased work of breathing. Pt placed back on BiPAP at 1010. K. Sickles, RN updated.   Call rapid response for additional needs.   [place] [name] Rapid Response RN

## 2020-04-22 NOTE — Progress Notes (Signed)
Duplicate note opened in error  

## 2020-04-23 DIAGNOSIS — J9601 Acute respiratory failure with hypoxia: Secondary | ICD-10-CM

## 2020-04-23 DIAGNOSIS — J9602 Acute respiratory failure with hypercapnia: Secondary | ICD-10-CM

## 2020-04-23 LAB — CBC
HCT: 40.9 % (ref 36.0–46.0)
Hemoglobin: 12.3 g/dL (ref 12.0–15.0)
MCH: 28.3 pg (ref 26.0–34.0)
MCHC: 30.1 g/dL (ref 30.0–36.0)
MCV: 94.2 fL (ref 80.0–100.0)
Platelets: 48 10*3/uL — ABNORMAL LOW (ref 150–400)
RBC: 4.34 MIL/uL (ref 3.87–5.11)
RDW: 14.6 % (ref 11.5–15.5)
WBC: 10.3 10*3/uL (ref 4.0–10.5)
nRBC: 0 % (ref 0.0–0.2)

## 2020-04-23 LAB — CULTURE, BLOOD (ROUTINE X 2)
Culture: NO GROWTH
Special Requests: ADEQUATE

## 2020-04-23 LAB — BASIC METABOLIC PANEL
Anion gap: 11 (ref 5–15)
BUN: 19 mg/dL (ref 8–23)
CO2: 47 mmol/L — ABNORMAL HIGH (ref 22–32)
Calcium: 8.1 mg/dL — ABNORMAL LOW (ref 8.9–10.3)
Chloride: 83 mmol/L — ABNORMAL LOW (ref 98–111)
Creatinine, Ser: 0.47 mg/dL (ref 0.44–1.00)
GFR calc Af Amer: >60 mL/min (ref >60)
GFR calc non Af Amer: >60 mL/min (ref >60)
Glucose, Bld: 106 mg/dL — ABNORMAL HIGH (ref 70–99)
Potassium: 2.6 mmol/L — CL (ref 3.5–5.1)
Sodium: 141 mmol/L (ref 135–145)

## 2020-04-23 LAB — MAGNESIUM: Magnesium: 2.1 mg/dL (ref 1.7–2.4)

## 2020-04-23 LAB — GLUCOSE, CAPILLARY
Glucose-Capillary: 94 mg/dL (ref 70–99)
Glucose-Capillary: 166 mg/dL — ABNORMAL HIGH (ref 70–99)
Glucose-Capillary: 107 mg/dL — ABNORMAL HIGH (ref 70–99)
Glucose-Capillary: 131 mg/dL — ABNORMAL HIGH (ref 70–99)

## 2020-04-23 MED ORDER — POTASSIUM CHLORIDE CRYS ER 20 MEQ PO TBCR: 40.0000 meq | EXTENDED_RELEASE_TABLET | Freq: Two times a day (BID) | ORAL | Status: DC

## 2020-04-23 MED ORDER — POTASSIUM CHLORIDE 10 MEQ/100ML IV SOLN
10.0000 meq | INTRAVENOUS | Status: AC
  Administered 2020-04-23 (×6): 10 meq via INTRAVENOUS
  Filled 2020-04-23 (×6): qty 100

## 2020-04-23 MED ORDER — IPRATROPIUM-ALBUTEROL 0.5-2.5 (3) MG/3ML IN SOLN: 3.0000 mL | RESPIRATORY_TRACT | Status: DC | PRN

## 2020-04-23 MED ORDER — POTASSIUM CHLORIDE 10 MEQ/100ML IV SOLN
10.0000 meq | INTRAVENOUS | Status: DC
  Administered 2020-04-23: 10 meq via INTRAVENOUS
  Filled 2020-04-23: qty 100

## 2020-04-23 MED ORDER — POTASSIUM CHLORIDE CRYS ER 20 MEQ PO TBCR
40.0000 meq | EXTENDED_RELEASE_TABLET | ORAL | Status: AC
  Administered 2020-04-23 (×2): 40 meq via ORAL
  Filled 2020-04-23 (×2): qty 2

## 2020-04-23 MED ORDER — POTASSIUM CHLORIDE CRYS ER 20 MEQ PO TBCR
40.0000 meq | EXTENDED_RELEASE_TABLET | ORAL | Status: DC
  Filled 2020-04-23: qty 2

## 2020-04-23 MED ORDER — IPRATROPIUM-ALBUTEROL 0.5-2.5 (3) MG/3ML IN SOLN
3.0000 mL | Freq: Two times a day (BID) | RESPIRATORY_TRACT | Status: DC
  Administered 2020-04-23 – 2020-04-27 (×9): 3 mL via RESPIRATORY_TRACT
  Filled 2020-04-23 (×9): qty 3

## 2020-04-23 NOTE — Progress Notes (Signed)
   Subjective:  Patient is seen at bedside this AM. She reports not feeling well as she did not sleep last night. Says her legs are doing okay as far as she can tell. Otherwise, denies CP.    Objective:  Vital signs in last 24 hours: Vitals:   04/22/20 2217 04/23/20 0000 04/23/20 0307 04/23/20 0400  BP:  (!) 143/72  (!) 146/64  Pulse: 86 84 86 78  Resp: (!) 24 20 (!) 23 19  Temp:  98 F (36.7 C)  97.8 F (36.6 C)  TempSrc:  Axillary  Axillary  SpO2: 97% 94% 96% 93%  Weight:      Height:       Physical Exam: General: Patient on BiPAP, looking uncomfortable in bed. CV: Normal rate, regular rhythm. No murmurs/rubs/gallops. MSK: Legs bilaterally dressed with gauze below knee.  Assessment/Plan:  Principal Problem:   Sepsis due to cellulitis [facility]) Active Problems:   Chronic idiopathic thrombocytopenia (HCC)   Chronic heart failure with preserved ejection fraction with moderate RV failure   Wound of lower extremity, bilateral   Obesity hypoventilation syndrome (HCC)   Chronic respiratory failure with hypoxia and hypercapnia (HCC)   Pressure injury of skin  Patient is 69yo female with diastolic heart failure, OSH, chronic ITP admitted for hypoxic, hypercarbic respiratory failure and sepsis 2/2 lower extremity cellulitis.  #Acute hypercapnic, hypoxic respiratory failure #Obesity hypoventilation syndrome #Acute on chronic diastolic heart failure Patient continues on BiPAP, as she has for the past two days. She does not appear to have much improvement of respiratory status since then. Patient continues to de-sat to 70's when taken off BiPAP. Discussed with nursing staff to attempt heated high flow today with respiratory therapist. Continuing diuresis as well. -BiPAP QHS, HHF during day as tolerated -DuoNebs BID -IV lasix 40mg  BID -Outpatient PFTs  #Sepsis 2/2 lower extremity cellulitis Patient continues to remain afebrile, HDS. Leukocytosis resolved. Will continue Rocephin for  full 7 day course.  -Rocephin (8/4-) (day 6/7) -s/p Vanc (8/4-8/6), Flagyl (8/5-8/6) -Appreciate WOC for assistance  #Chronic ITP No active signs of bleeding. PLT today 48,000, plan to d/c DVT prophylaxis for now. -D/c Lovenox  #Disability 2/2 morbid obesity PT/OT recommending SNF, supervision/assistance 24hr. I agree that Ms. Stivers will need prolonged assistance and rehabilitation as she recovers from this hospitalization. It will be important to have discussions with her regarding her medical conditions and f/u appointments, as she appears to have poor insight. Appreciate PT/OT for recs, continued following. -Continue with PT/OT   #HTN BP stable on home meds. -Carvedilol 6.25mg  Bid  #Chronic back pain Will continue to monitor pain regimen given patient's respiratory status -Percocet 5mg  q8 PRN -Tylenol 325mg  TID PRN -Miralax for opioid-induced constipation   Diet: HH IVF: n/a DVT PPX: n/a - PLT 48,000 Code Status: Full Prior to Admission Living Arrangement: home Anticipated Discharge Location: SNF per PT/OT Barriers to Discharge: hypoxia Dispo: Anticipated discharge in approximately 2-3 day(s).   Sanjuan Dame, MD 04/23/2020, 5:04 AM Pager: [phone] After 5pm on weekdays and 1pm on weekends: On Call pager [phone]

## 2020-04-23 NOTE — Progress Notes (Addendum)
NAME:  [name], MRN:  [id number], DOB:  02-Dec-1950, LOS: 5 ADMISSION DATE:  04/18/2020, CONSULTATION DATE:  04/23/20 REFERRING MD: Graciella Freer / Dr. Evette Doffing CHIEF COMPLAINT:  shortness of breath   Brief History   [name] is a 69 yo F w/ PMH of OSH/OSA, diastolic heart failure, chronic ITP, chronic venous stasis wounds and chronic hypoxic/hypercabic respiratory failure, 2L O2 dependent admitted for sepsis due to LLE cellulitis who developed AMS 8/7 AM, PCCM consulted for hypercarbic respiratory failure.    Micro Data:  BCx2 8/4 >> 1 of 2 diptheroids  UC 8/4 >> aerococcus sp.  Antimicrobials:  Vanc 8/4 >> 8/6 Flagyl 8/4 >> 8/6 Cefepime 8/4 >> 8/6 CTX 8/6 >>   Interim history/subjective:  Afebrile  Pt reports she does not want to be here, hopeful to go home On heated high flow [place] 1.8L UOP in last 24 hours, negative balance overall  Objective   Blood pressure (!) 156/77, pulse 92, temperature 98.8 F (37.1 C), temperature source Axillary, resp. rate (!) 24, height 5' (1.524 m), weight 107.4 kg, SpO2 94 %.    FiO2 (%):  [40 %-70 %] 70 %   Intake/Output Summary (Last 24 hours) at 04/23/2020 1050 Last data filed at 04/23/2020 0548 Gross per 24 hour  Intake 500 ml  Output 1800 ml  Net -1300 ml   Filed Weights   04/20/20 0708 04/21/20 0651 04/22/20 0408  Weight: 111.6 kg 111.8 kg 107.4 kg    Examination: General: chronically ill appearing adult female lying in bed in NAD HEENT: MM pink/moist, wearing glasses, anicteric Neuro: AAOx4 CV: s1s2 rrr, no m/r/g PULM: non-labored on HFNC, lungs bilaterally clear anterior, diminished bases  GI: soft, bsx4 active  Extremities: warm/dry, BLE edema / wrapped with bandages, changes consistent with chronic venous stasis  Skin: no rashes or lesions  Resolved Hospital Problem list      Assessment & Plan:   Acute on chronic hypoxemic and hypercarbic respiratory failure OHS Pulmonary Hypertension - on ECHO  Baseline 2L O2  requirement at home and elevated bicarb on BMPs to suggest OHS as a chronic cause. Her CXR on admission and in the past year with chronic volume overload. Suspect acute decompensation due to worsened edema and fluid overload given CXR appearance 8/7 (day of rapid response) in setting of decompensated OHS / PH.  -stop lasix, follow I/O's suspect nearing contraction alkalosis  -Mandatory QHS BiPAP and PRN daytime sleep  -consider home PRN lasix dosing for edema  -wean O2 for sats >90%, currently 100% on HFNC > room to wean back to baseline  -continue duoneb  -follow up CXR in am  -rec's for outpatient pulmonary follow up but if she will not come, could consider home sleep study    PCCM will be available as needed. Please call back if new needs arise.    Labs   CBC: Recent Labs  Lab 04/18/20 2015 04/18/20 2300 04/19/20 0341 04/19/20 0341 04/19/20 0618 04/20/20 0237 04/21/20 0047 04/22/20 0114 04/23/20 0048  WBC 21.8*   < > 22.1*  --   --  18.3* 18.0* 10.1 10.3  NEUTROABS 20.1*  --  20.9*  --   --   --   --   --   --   HGB 13.5   < > 12.1   < > 12.6 11.0* 12.2 11.6* 12.3  HCT 44.6   < > 40.2   < > 37.0 38.1 41.5 38.9 40.9  MCV 92.1   < >  94.4  --   --  96.5 96.3 95.6 94.2  PLT 59*   < > 50*  --   --  50* 54* 58* 48*   < > = values in this interval not displayed.    Basic Metabolic Panel: Recent Labs  Lab 04/19/20 0341 04/19/20 0341 04/19/20 0618 04/20/20 0237 04/21/20 0047 04/22/20 0114 04/23/20 0048  NA 140   < > 139 143 142 144 141  K 3.5   < > 3.5 3.6 3.8 3.2* 2.6*  CL 93*  --   --  95* 92* 87* 83*  CO2 38*  --   --  38* 39* 43* 47*  GLUCOSE 210*  --   --  114* 110* 91 106*  BUN 17  --   --  19 18 18 19   CREATININE 0.73  --   --  0.55 0.51 0.58 0.47  CALCIUM 8.5*  --   --  8.5* 8.6* 8.4* 8.1*  MG 1.8  --   --   --   --   --  2.1  PHOS 3.7  --   --   --   --   --   --    < > = values in this interval not displayed.   GFR: Estimated Creatinine Clearance: 75.7  mL/min (by C-G formula based on SCr of 0.47 mg/dL). Recent Labs  Lab 04/18/20 1935 04/18/20 2015 04/20/20 0237 04/21/20 0047 04/22/20 0114 04/23/20 0048  WBC  --    < > 18.3* 18.0* 10.1 10.3  LATICACIDVEN 1.2  --   --   --   --   --    < > = values in this interval not displayed.    Liver Function Tests: Recent Labs  Lab 04/18/20 2015 04/19/20 0341 04/20/20 0237 04/21/20 0047 04/22/20 0114  AST 55* 54* 23 15 14*  ALT 54* 71* 47* 43 32  ALKPHOS 127* 104 82 80 68  BILITOT 2.0* 1.1 0.6 1.0 1.2  PROT 6.7 5.8* 5.6* 6.0* 5.6*  ALBUMIN 4.0 3.3* 2.9* 3.3* 3.0*   No results for input(s): LIPASE, AMYLASE in the last 168 hours. No results for input(s): AMMONIA in the last 168 hours.  ABG    Component Value Date/Time   PHART 7.431 04/22/2020 0228   PCO2ART 75.2 (HH) 04/22/2020 0228   PO2ART 105 04/22/2020 0228   HCO3 49.0 (H) 04/22/2020 0228   TCO2 45 (H) 04/19/2020 0618   O2SAT 98.7 04/22/2020 0228     Coagulation Profile: Recent Labs  Lab 04/18/20 2015  INR 1.1    Cardiac Enzymes: Recent Labs  Lab 04/20/20 0237  CKTOTAL 10*    HbA1C: Hgb A1c MFr Bld  Date/Time Value Ref Range Status  04/19/2020 03:41 AM 6.1 (H) 4.8 - 5.6 % Final    Comment:    (NOTE) Pre diabetes:          5.7%-6.4%  Diabetes:              >6.4%  Glycemic control for   <7.0% adults with diabetes     CBG: Recent Labs  Lab 04/22/20 0715 04/22/20 1141 04/22/20 1625 04/22/20 2100 04/23/20 [place], MSN, NP-C [facility] 04/23/2020, 10:50 AM   Please see [email or web address] for pager details.

## 2020-04-23 NOTE — Progress Notes (Signed)
CRITICAL VALUE ALERT  Critical Value:  Potassium 2.6  Date & Time Notied:  04/23/20 0225  Provider Notified: Physicians on sticky note  Orders Received/Actions taken: Potassium via IV

## 2020-04-24 ENCOUNTER — Inpatient Hospital Stay (HOSPITAL_COMMUNITY): Payer: Medicare PPO

## 2020-04-24 LAB — GLUCOSE, CAPILLARY
Glucose-Capillary: 99 mg/dL (ref 70–99)
Glucose-Capillary: 161 mg/dL — ABNORMAL HIGH (ref 70–99)
Glucose-Capillary: 161 mg/dL — ABNORMAL HIGH (ref 70–99)
Glucose-Capillary: 180 mg/dL — ABNORMAL HIGH (ref 70–99)

## 2020-04-24 LAB — BASIC METABOLIC PANEL
Anion gap: 11 (ref 5–15)
BUN: 14 mg/dL (ref 8–23)
CO2: 37 mmol/L — ABNORMAL HIGH (ref 22–32)
Calcium: 8.3 mg/dL — ABNORMAL LOW (ref 8.9–10.3)
Chloride: 91 mmol/L — ABNORMAL LOW (ref 98–111)
Creatinine, Ser: 0.41 mg/dL — ABNORMAL LOW (ref 0.44–1.00)
GFR calc Af Amer: >60 mL/min (ref >60)
GFR calc non Af Amer: >60 mL/min (ref >60)
Glucose, Bld: 90 mg/dL (ref 70–99)
Potassium: 4.6 mmol/L (ref 3.5–5.1)
Sodium: 139 mmol/L (ref 135–145)

## 2020-04-24 LAB — CBC
HCT: 42.5 % (ref 36.0–46.0)
Hemoglobin: 12.6 g/dL (ref 12.0–15.0)
MCH: 27.8 pg (ref 26.0–34.0)
MCHC: 29.6 g/dL — ABNORMAL LOW (ref 30.0–36.0)
MCV: 93.6 fL (ref 80.0–100.0)
Platelets: 51 10*3/uL — ABNORMAL LOW (ref 150–400)
RBC: 4.54 MIL/uL (ref 3.87–5.11)
RDW: 14.6 % (ref 11.5–15.5)
WBC: 13.7 10*3/uL — ABNORMAL HIGH (ref 4.0–10.5)
nRBC: 0 % (ref 0.0–0.2)

## 2020-04-24 MED ORDER — FUROSEMIDE 80 MG PO TABS
80.0000 mg | ORAL_TABLET | Freq: Every day | ORAL | Status: DC
  Administered 2020-04-24 – 2020-04-30 (×7): 80 mg via ORAL
  Filled 2020-04-24 (×7): qty 1

## 2020-04-24 MED ORDER — ACETAMINOPHEN 325 MG PO TABS
650.0000 mg | ORAL_TABLET | Freq: Four times a day (QID) | ORAL | Status: DC | PRN
  Administered 2020-04-24 – 2020-04-29 (×10): 650 mg via ORAL
  Filled 2020-04-24 (×10): qty 2

## 2020-04-24 MED ORDER — DICLOFENAC SODIUM 1 % EX GEL
2.0000 g | Freq: Four times a day (QID) | CUTANEOUS | Status: DC | PRN
  Administered 2020-04-24: 2 g via TOPICAL
  Filled 2020-04-24: qty 100

## 2020-04-24 MED ORDER — ENOXAPARIN SODIUM 40 MG/0.4ML ~~LOC~~ SOLN
40.0000 mg | SUBCUTANEOUS | Status: AC
  Administered 2020-04-24: 40 mg via SUBCUTANEOUS
  Filled 2020-04-24: qty 0.4

## 2020-04-24 NOTE — Progress Notes (Addendum)
Physical Therapy Treatment Patient Details Name: [name] MRN: [id number] DOB: April 18, 1951 Today's Date: 04/24/2020    History of Present Illness 69yo female brought to the ED with AMS at home, found be septic likely due to purulent cellulitis of LLE. Needed NPPV in ED at FiO2 at 50%. CXR clear. Found to be covered in stool in ED. Also found to have acute respiratory failure. PMH HTN, vertebral compression fractures, depression, CKD, CP, LBP, lumbar fusion    PT Comments    Patient was received laying in bed and said she "did not want to get up" but was willing to do exercises in bed. Therapeutic exercises: Heel slides, glute bridges, modified straight leg raises, ankle pumps, and adduction squeezes x 10 bilaterally. Patient was tired while completing these exercises and had difficulty completing heel slides, glute bridges, and mod SLR's against gravity. Patient's O2 sat remained above 95% on 20L HFNC. HR remained WNL. Discussed that we strongly recommend d/c to SNF to help with functional mobility as she stated she currently feels "much weaker than her baseline" and she said she would consider going but has to talk to her daughter. She was left laying in bed with all needs within reach.   Patient suffers from morbid obesity, severe deconditioning and weakness, and impaired mobility which impairs their ability to perform daily activities like transfers and gait in the home.  A walker alone will not resolve the issues with performing activities of daily living. A wheelchair will allow patient to safely perform daily activities.  The patient can self propel in the home or has a caregiver who can provide assistance.        Follow Up Recommendations  SNF;Supervision/Assistance - 24 hour     Equipment Recommendations  Other (comment);Hospital bed;Wheelchair (measurements PT);Rolling walker with 5" wheels;Wheelchair cushion (measurements PT);3in1 (PT) (**if patient decides to d/c home she will  need the following equipment (bariatric RW and WC))    Recommendations for Other Services       Precautions / Restrictions Precautions Precautions: Fall;Other (comment) Precaution Comments: watch sats, morbid obesity Restrictions Weight Bearing Restrictions: No    Mobility  Bed Mobility               General bed mobility comments: deferred  Transfers                 General transfer comment: deferred  Ambulation/Gait             General Gait Details: deferred   Stairs             Wheelchair Mobility    Modified Rankin (Stroke Patients Only)       Balance       Sitting balance - Comments: not able to assss       Standing balance comment: not able to assess                            Cognition Arousal/Alertness: Awake/alert Behavior During Therapy: Flat affect Overall Cognitive Status: No family/caregiver present to determine baseline cognitive functioning Area of Impairment: Attention;Following commands;Awareness;Safety/judgement;Problem solving                   Current Attention Level: Sustained   Following Commands: Follows one step commands consistently Safety/Judgement: Decreased awareness of safety;Decreased awareness of deficits Awareness: Emergent Problem Solving: Slow processing;Decreased initiation;Difficulty sequencing;Requires verbal cues;Requires tactile cues        Exercises  General Comments        Pertinent Vitals/Pain Pain Assessment: Faces Faces Pain Scale: Hurts little more Pain Descriptors / Indicators: Discomfort Pain Intervention(s): Monitored during session;Limited activity within patient's tolerance    Home Living                      Prior Function            PT Goals (current goals can now be found in the care plan section) Acute Rehab PT Goals Patient Stated Goal: get back in bed, take a nap PT Goal Formulation: With patient Time For Goal Achievement:  05/04/20 Potential to Achieve Goals: Fair Progress towards PT goals: Progressing toward goals    Frequency    Min 2X/week      PT Plan Current plan remains appropriate    Co-evaluation              AM-PAC PT "6 Clicks" Mobility   Outcome Measure  Help needed turning from your back to your side while in a flat bed without using bedrails?: A Lot Help needed moving from lying on your back to sitting on the side of a flat bed without using bedrails?: Total Help needed moving to and from a bed to a chair (including a wheelchair)?: Total Help needed standing up from a chair using your arms (e.g., wheelchair or bedside chair)?: Total Help needed to walk in hospital room?: Total Help needed climbing 3-5 steps with a railing? : Total 6 Click Score: 7    End of Session Equipment Utilized During Treatment: Oxygen Activity Tolerance: Patient limited by fatigue Patient left: in bed;with call bell/phone within reach Nurse Communication: Mobility status PT Visit Diagnosis: Unsteadiness on feet (R26.81);Difficulty in walking, not elsewhere classified (R26.2);Muscle weakness (generalized) (M62.81);Adult, failure to thrive (R62.7)        04/24/20 1500  PT Time Calculation  PT Start Time (ACUTE ONLY) 1517  PT Stop Time (ACUTE ONLY) 1532  PT Time Calculation (min) (ACUTE ONLY) 15 min  PT General Charges  $$ ACUTE PT VISIT 1 Visit  PT Treatments  $Therapeutic Exercise 8-22 mins                     Livingston Diones, SPT, ATC  Windell Norfolk, DPT, PN1   Supplemental Physical Therapist [place]    Pager [phone] Acute Rehab Office [phone]

## 2020-04-24 NOTE — TOC Progression Note (Signed)
Transition of Care [facility]) - Progression Note    Patient Details  Name: [name] MRN: [id number] Date of Birth: 1951-01-28  Transition of Care [facility]) CM/SW Contact  Joanne Chars, LCSW Phone Number: 04/24/2020, 2:53 PM  Clinical Narrative:   CSW met with pt and determined she had not had sleep study and cannot get bipap here in hospital.  CSW discussed with teaching service that bipap not option and whether trilogy would be plan.  Plan confirmed by MD.  Damaris Schooner with Thedore Mins from [employer] who brought up paperwork, met with MD Truman Hayward, who signed paperwork and agreed to provide needed note for this equipment.  Thedore Mins will follow up with insurance auth and speak with patient regarding copays.    Expected Discharge Plan: [place] Barriers to Discharge: Continued Medical Work up  Expected Discharge Plan and Services Expected Discharge Plan: [place]: [place] arrangements for the past 2 months: Single Family Home                                       Social Determinants of Health (SDOH) Interventions    Readmission Risk Interventions No flowsheet data found.

## 2020-04-24 NOTE — Progress Notes (Signed)
   Subjective:  Patient seen at bedside this AM. Reports she is breathing a little better today, no pain in her legs. Denies any other complaints.  Objective:  Vital signs in last 24 hours: Vitals:   04/23/20 2319 04/23/20 2321 04/24/20 0323 04/24/20 0405  BP: (!) 155/68 (!) 155/68  (!) 148/64  Pulse: 76 78  65  Resp: 19 (!) 22  18  Temp: 98.5 F (36.9 C)  98.3 F (36.8 C) 98.4 F (36.9 C)  TempSrc: Oral  Axillary Axillary  SpO2: 100% 93%  98%  Weight:      Height:       Physical Exam: General: Resting in bed w/ HFNC, no acute distress Pulm: Bilateral rales in lung bases, poor respiratory effort MSK: BLE wrapped with gauze, chronic skin changes in feet, toenails  Assessment/Plan:  Principal Problem:   Sepsis due to cellulitis (HCC) Active Problems:   Chronic idiopathic thrombocytopenia (HCC)   Chronic heart failure with preserved ejection fraction with moderate RV failure   Wound of lower extremity, bilateral   Obesity hypoventilation syndrome (HCC)   Chronic respiratory failure with hypoxia and hypercapnia (HCC)   Pressure injury of skin  Patient is 69yo female with diastolic heart failure, OSH, chronic ITP admitted for hypercarbic respiratory failure, sepsis 2/2 LE cellulitis.  #Acute hypercapnic, hypoxic respiratory failure #Obesity hypoventilation syndrome #Acute on chronic diastolic HF Patient tolerated HFNC yesterday with BiPAP QHS. This AM sating well on HFNC 20 L/min, 70% FiO2. IV lasix d/c'd yesterday given concern for contraction alkalosis. AM labs today w/ K+ 3.9, bicarb 37. On exam, continues to have bilateral rales. CXR this AM w/ interstitial edema, slightly worse than last Friday. Plan to re-start home lasix. Will need to discuss options with CSW to get BiPAP QHS after discharge. Patient was not previously using BiPAP last week, leading to worsening mentation and hypercapnia, hypoxia. She has nighttime hypoventilation and will need an outpatient sleep study  (no prior study). Goal to wean to supplemental O2 for discharge. -HFNC as tolerated -BiPAP QHS -DuoNebs BID -Start home po lasix 80mg  QD -Outpatient PFT's, sleep study  #Sepsis 2/2 lower extremity cellulitis Patient afebrile, HDS. WBC 13.7 < 10.3, likely 2/2 DuoNebs. Will continue to monitor vitals. Last day of Rocephin today. -IV Rocephin (8/4-8/10) (day 7/7) -s/p vanc (8/4-8/6), flagyl (8/5-8/6) -Appreciate WOC assistance  #Chronic ITP PLT 51,000. Continue to trend daily. Will give DVT prophylaxis if >50,000. -Lovenox 40mg  QD  #Disability 2/2 morbid obesity PT/OT recommending SNF, supervision/assistance. I agree [name] will need prolonged assistance, rehabilitation as she recovers from this hospitalization. It will be important to have discussions with her regarding her medical conditions and ensuring she follows-up with scheduled appointments, as she appears to have poor insight. Appreciate PT/OT recs -C/w PT/OT  #HTN BP stable, continue home medications. -Coreg 6.25mg  BID  #Chronic back pain Pain is controlled, will monitor given respiratory status. -Percocet 5mg  q8 PRN -Tylenol 325mg  TID PRN -Miralax for opioid-induced constipation  Diet: HH IVF: n/a Bowel: Miralax DVT PPX: Lovenox 40mg  QD (if PLT >50,000) Code Status: Full Prior to Admission Living Arrangement: home Anticipated Discharge Location: SNF per PT/OT Barriers to Discharge: hypoxia Dispo: Anticipated discharge in approximately 2-3 day(s).   Sanjuan Dame, MD 04/24/2020, 7:15 AM Pager: [phone] After 5pm on weekdays and 1pm on weekends: On Call pager [phone]

## 2020-04-24 NOTE — Discharge Summary (Addendum)
Name: [name] MRN: [id number] DOB: [birth year] 69 y.o. PCP: Evelyn Bennett, MD  Date of Admission: 04/18/2020  7:16 PM Date of Discharge: 04/30/20 Attending Physician: Evelyn Contes, MD  Discharge Diagnosis: 1. Acute on chronic hypercapnic, hypoxic respiratory failure 2. Sepsis 2/2 lower extremity cellulitis 3. Obesity hypoventilation syndrome 4. Acute on chronic diastolic heart failure 5. Chronic ITP 6. Stage 1 pressure ulcer L foot   Discharge Medications: Allergies as of 04/28/2020   No Known Allergies     Medication List    TAKE these medications   amLODipine 5 MG tablet Commonly known as: NORVASC Take 1 tablet (5 mg total) by mouth daily.   Baclofen 5 MG Tabs Take one tablet three times per day for 3 days. If spasms do not improve, take 2 tablets three times per day. If still having spasms on 2 tablets three times daily, please call the clinic.   carvedilol 6.25 MG tablet Commonly known as: COREG Take 1 tablet (6.25 mg total) by mouth 2 (two) times daily with a meal.   EX-LAX PO Take 1 tablet by mouth as needed (for constipation).   furosemide 80 MG tablet Commonly known as: LASIX TAKE 1 TABLET BY MOUTH DAILY; 1 TABLET TWICE DAILY IF YOU GAIN 3 POUNDS IN 1 WEEK OR 5 POUNDS IN 1 DAY What changed: See the new instructions.   lisinopril 2.5 MG tablet Commonly known as: ZESTRIL Take 1 tablet (2.5 mg total) by mouth daily.   oxyCODONE-acetaminophen 10-325 MG tablet Commonly known as: PERCOCET Take 1 tablet by mouth 3 (three) times daily.   polyethylene glycol 17 g packet Commonly known as: MIRALAX / GLYCOLAX Take 17 g by mouth daily as needed for mild constipation (MIX AND DRINK).   potassium chloride 10 MEQ tablet Commonly known as: KLOR-CON Take 1 tablet (10 mEq total) by mouth daily.   promethazine 12.5 MG tablet Commonly known as: PHENERGAN Take 1 tablet (12.5 mg total) by mouth every 6 (six) hours as needed for nausea or vomiting.    senna-docusate 8.6-50 MG tablet Commonly known as: Senokot S Take 2 tablets by mouth 2 (two) times daily. What changed:   when to take this  reasons to take this   tiZANidine 2 MG tablet Commonly known as: ZANAFLEX Take 2 mg by mouth every 6 (six) hours as needed for muscle spasms.   TYLENOL PM EXTRA STRENGTH PO Take 2 tablets by mouth at bedtime.      Disposition and follow-up:   [name] was discharged from [facility] in Stable condition.  At the hospital follow up visit please address:  1.  Patient with acute on chronic hypoxic, hypercapnic respiratory failure on arrival, requiring NRB. ABG w/ normal pH, pCO2 74. Patient requiring BiPAP QHS during admission. She initially refused BiPAP, which subsequently led to altered mentation, worsening respiratory status. Patient improved with regularly scheduled BiPAP, will need outpatient sleep study, PFT's. Patient's lower extremities w/ acute on chronic changes, including open, draining lesions with dark crusting. Completed 7d course of ceftriaxone.  2.  Labs / imaging needed at time of follow-up: PFT's, sleep study, CBC, BMP  3.  Pending labs/ test needing follow-up: n/a  Follow-up Appointments:   Hospital Course by problem list: 1. Acute on chronic hypoxic, hypercapnic respiratory failure, acute on chronic diastolic heart failure exacerbation, obesity hypoventilation syndrome: Patient initially arrived altered, requiring BiPAP, NRB. After initial improvement with DuoNebs and lasix, patient refused BiPAP QHS. Subsequently patient's respiratory status declined  and required BiPAP x2d. Since then, patient has incrementally weaned down on O2 while using BiPAP QHS, now only requiring 2-3L O2 [place]. Patient on supplemental O2 at home and is stable for discharge to SNF.  2.Sepsis 2/2 lower extremity cellulitis: On arrival patient was febrile, tachycardic, tachypneic w/ elevated WBC. Bilateral legs with dark crusted,  draining lesions. Reportedly patient had Home Health previously take care of wounds, but no visit x3 weeks prior to arrival. The dressing on the wounds had not been changed since then. Patient completed 7d course of Rocephin, and has since been afebrile, HDS without leukocytosis.  3. Chronic ITP: Patient dx in 2009, stable since then. PLT stable during hospitalization. Patient was given DVT PPX when PLT >50,000.  4. Stage 1 pressure ulcer on L foot: On exam noted to have closed, erythematous ulcer on L foot. Given K pads, encouraged patient to get out of bed and relieve pressure.  Discharge Vitals:   BP 139/69 (BP Location: Left Arm)   Pulse 82   Temp 98 F (36.7 C) (Oral)   Resp 20   Ht 5' (1.524 m)   Wt 101.9 kg   SpO2 93%   BMI 43.87 kg/m   Pertinent Labs, Studies, and Procedures:  CBC Latest Ref Rng & Units 04/30/2020 04/28/2020 04/27/2020  WBC 4.0 - 10.5 K/uL 9.3 8.9 8.3  Hemoglobin 12.0 - 15.0 g/dL 12.2 12.3 11.3(L)  Hematocrit 36 - 46 % 40.0 41.4 38.5  Platelets 150 - 400 K/uL 55(L) 52(L) 45(L)   BMP Latest Ref Rng & Units 04/30/2020 04/30/2020 04/29/2020  Glucose 70 - 99 mg/dL 144(H) 155(H) 139(H)  BUN 8 - 23 mg/dL 13 16 13   Creatinine 0.44 - 1.00 mg/dL 0.45 0.50 0.47  BUN/Creat Ratio 12 - 28 - - -  Sodium 135 - 145 mmol/L 139 137 141  Potassium 3.5 - 5.1 mmol/L 4.5 5.2(H) 5.1  Chloride 98 - 111 mmol/L 89(L) 85(L) 88(L)  CO2 22 - 32 mmol/L 41(H) 43(H) 45(H)  Calcium 8.9 - 10.3 mg/dL 8.9 8.9 9.0   CXR 04/18/20: IMPRESSION: Low lung volumes with bandlike opacities may reflect areas of subsegmental atelectasis  More patchy consolidative opacity in the right infrahilar lung which could reflect further atelectasis or infectious consolidation.  TTE 04/19/20: IMPRESSIONS: 1. Left ventricular ejection fraction, by estimation, is >75%. The left  ventricle has hyperdynamic function. The left ventricle has no regional  wall motion abnormalities. There is moderate left ventricular  hypertrophy.  Left ventricular diastolic  parameters are consistent with Grade I diastolic dysfunction (impaired  relaxation).  2. Right ventricular systolic function is normal. The right ventricular  size is normal. There is moderately elevated pulmonary artery systolic  pressure.  3. The mitral valve is normal in structure. No evidence of mitral valve  regurgitation. No evidence of mitral stenosis.  4. The aortic valve is tricuspid. Aortic valve regurgitation is not  visualized. Mild aortic valve sclerosis is present, with no evidence of  aortic valve stenosis.  5. The inferior vena cava is normal in size with <50% respiratory  variability, suggesting right atrial pressure of 8 mmHg.   Discharge Instructions:  Ms. Grayer, I am glad you are feeling better. You were admitted because your oxygen levels were dangerously low and there was an infection in your legs. In addition, you had a lot of fluid built up from your heart failure. In order to keep your oxygen levels high, we had to put you on a BiPAP machine. We also took  fluid off with lasix. As time progressed, we were able to decrease the amount of extra oxygen required to keep your oxygen levels high. Moving forward, it will be very important to continue using the BiPAP machine at night. This will help decrease the risk of further hospitalizations. Please see the following notes:  -In order to continue improving, it is very important you work with physical therapy every day at the facility. You need to build up strength so that you will be able to move around easier. If you are not able to move around, this puts you at an increase risk for heart failure exacerbations, further breathing problems, and infections.   -In addition, you will need to continue using your BiPAP machine at night. This might take awhile to get used to, but it is very important for your condition. When you were in the hospital and did not use the BiPAP machine at  night, you had a hard time breathing and were confused. Using the BiPAP machine will help you decrease the risk of any further complications.  -Please make sure to follow-up with your primary care doctor. You will need to have a sleep study and have lung function tests in order to put you on the best medications.  -You will need to have [facility] continue looking after your leg wounds. They were infected when you came into the hospital. We treated the infection with 7 days of ceftriaxone, an antibiotic. You want to make sure to take care of the wounds so that you can prevent any further infection and further time spent in the hospital.  -Continue taking your home medications as you were before you came to the hospital.  -If you develop a fever, chest pain, or worsening shortness of breath, please make sure to visit the doctor as soon as possible.  -It was a pleasure meeting you, Ms. Fass. I hope you recover quickly and I wish you the best!  Thank you, Sanjuan Dame, MD  Signed: Sanjuan Dame, MD 04/30/2020, 12:06 PM   Pager: [phone]

## 2020-04-25 DIAGNOSIS — L988 Other specified disorders of the skin and subcutaneous tissue: Secondary | ICD-10-CM

## 2020-04-25 LAB — BASIC METABOLIC PANEL
Anion gap: 8 (ref 5–15)
BUN: 15 mg/dL (ref 8–23)
CO2: 44 mmol/L — ABNORMAL HIGH (ref 22–32)
Calcium: 8.9 mg/dL (ref 8.9–10.3)
Chloride: 86 mmol/L — ABNORMAL LOW (ref 98–111)
Creatinine, Ser: 0.37 mg/dL — ABNORMAL LOW (ref 0.44–1.00)
GFR calc Af Amer: >60 mL/min (ref >60)
GFR calc non Af Amer: >60 mL/min (ref >60)
Glucose, Bld: 155 mg/dL — ABNORMAL HIGH (ref 70–99)
Potassium: 3.6 mmol/L (ref 3.5–5.1)
Sodium: 138 mmol/L (ref 135–145)

## 2020-04-25 LAB — CBC
HCT: 39 % (ref 36.0–46.0)
Hemoglobin: 11.9 g/dL — ABNORMAL LOW (ref 12.0–15.0)
MCH: 28.5 pg (ref 26.0–34.0)
MCHC: 30.5 g/dL (ref 30.0–36.0)
MCV: 93.3 fL (ref 80.0–100.0)
Platelets: 41 10*3/uL — ABNORMAL LOW (ref 150–400)
RBC: 4.18 MIL/uL (ref 3.87–5.11)
RDW: 14.9 % (ref 11.5–15.5)
WBC: 14.6 10*3/uL — ABNORMAL HIGH (ref 4.0–10.5)
nRBC: 0 % (ref 0.0–0.2)

## 2020-04-25 LAB — GLUCOSE, CAPILLARY
Glucose-Capillary: 105 mg/dL — ABNORMAL HIGH (ref 70–99)
Glucose-Capillary: 176 mg/dL — ABNORMAL HIGH (ref 70–99)
Glucose-Capillary: 175 mg/dL — ABNORMAL HIGH (ref 70–99)
Glucose-Capillary: 149 mg/dL — ABNORMAL HIGH (ref 70–99)
Glucose-Capillary: 150 mg/dL — ABNORMAL HIGH (ref 70–99)

## 2020-04-25 MED ORDER — SENNOSIDES-DOCUSATE SODIUM 8.6-50 MG PO TABS
1.0000 | ORAL_TABLET | Freq: Every day | ORAL | Status: DC
  Administered 2020-04-25 – 2020-04-27 (×3): 1 via ORAL
  Filled 2020-04-25 (×3): qty 1

## 2020-04-25 NOTE — Progress Notes (Signed)
Pt wants to wait until 11:00pm to put on bipap

## 2020-04-25 NOTE — Progress Notes (Signed)
Occupational Therapy Treatment Patient Details Name: [name] MRN: [id number] DOB: [birth year] Today's Date: 04/25/2020    History of present illness 69yo female brought to the ED with AMS at home, found be septic likely due to purulent cellulitis of LLE. Needed NPPV in ED at FiO2 at 50%. CXR clear. Found to be covered in stool in ED. Also found to have acute respiratory failure. PMH HTN, vertebral compression fractures, depression, CKD, CP, LBP, lumbar fusion   OT comments  Pt agreeable to OT session this date. She required modA to progress to EOB, pt completed grooming activity sitting EOB, vitals within normal range. Pt on 20L HFNC, SpO2 dropped to 90% with standing otherwise SpO2 >93% throughout session. Pt stood from EOB with modA to powerup and use of RW for stability, she took side steps toward [facility] to reposition requiring minA. Pt will continue to benefit from occupational therapy services to address activity tolerance, strength and stability, and energy conservation strategies to maximize safety and independence with ADL/IADL and functional mobility. Pt verbalized she is continuing to think about d/c to SNF prior to returning home.    Follow Up Recommendations  SNF;Supervision/Assistance - 24 hour    Equipment Recommendations  Other (comment) (TBD)    Recommendations for Other Services      Precautions / Restrictions Precautions Precautions: Fall;Other (comment) Precaution Comments: watch sats, morbid obesity Restrictions Weight Bearing Restrictions: No       Mobility Bed Mobility Overal bed mobility: Needs Assistance Bed Mobility: Rolling;Sidelying to Sit;Sit to Supine Rolling: Mod assist Sidelying to sit: Mod assist;HOB elevated   Sit to supine: Mod assist;HOB elevated   General bed mobility comments: modA for BLE management and trunk mangement;educated pt on log rolling technique for bed mobiltiy  Transfers Overall transfer level: Needs assistance Equipment  used: Rolling walker (2 wheeled) Transfers: Sit to/from Stand Sit to Stand: Mod assist         General transfer comment: modA to stand from EOB, minA to side step toward [facility]    Balance Overall balance assessment: Needs assistance Sitting-balance support: Single extremity supported;Feet supported Sitting balance-Leahy Scale: Poor Sitting balance - Comments: reliant on at least single UE support Postural control: Posterior lean Standing balance support: Bilateral upper extremity supported;During functional activity Standing balance-Leahy Scale: Poor Standing balance comment: reliant on BUE support                           ADL either performed or assessed with clinical judgement   ADL Overall ADL's : Needs assistance/impaired     Grooming: Set up;Sitting Grooming Details (indicate cue type and reason): sitting EOB             Lower Body Dressing: Moderate assistance;Sit to/from stand Lower Body Dressing Details (indicate cue type and reason): assist to don socks, modA to stand from Bennett and Hygiene: Moderate assistance;Sit to/from stand Toileting - Clothing Manipulation Details (indicate cue type and reason): vc for safe hand placement     Functional mobility during ADLs: Moderate assistance;Rolling walker General ADL Comments: pt sat EOB for 20 min while engaging in ADL with stable vitals on 20L O2. pt stood at EOB and took side steps toward HOB, SpO2 dropped to 90% on 20L     Vision   Vision Assessment?: No apparent visual deficits   Perception     Praxis      Cognition Arousal/Alertness: Awake/alert Behavior During  Therapy: Flat affect Overall Cognitive Status: Within Functional Limits for tasks assessed                                 General Comments: for basic mobility; pt with self-limiting behaviors, anxious during mobility         Exercises     Shoulder Instructions       General  Comments RR 17-19, SpO2 90%-98% on 20LPM    Pertinent Vitals/ Pain       Pain Assessment: Faces Faces Pain Scale: Hurts little more Pain Location: back Pain Descriptors / Indicators: Grimacing;Guarding Pain Intervention(s): Limited activity within patient's tolerance;Monitored during session  Home Living                                          Prior Functioning/Environment              Frequency  Min 2X/week        Progress Toward Goals  OT Goals(current goals can now be found in the care plan section)  Progress towards OT goals: Progressing toward goals  Acute Rehab OT Goals Patient Stated Goal: to go home OT Goal Formulation: With patient Time For Goal Achievement: 05/04/20 Potential to Achieve Goals: Good ADL Goals Pt Will Perform Grooming: with modified independence;sitting Pt Will Perform Upper Body Bathing: with min guard assist;sitting Pt Will Perform Lower Body Bathing: with mod assist;sitting/lateral leans Additional ADL Goal #1: Pt to demonstrate ability to complete sit to stand transfer with Mod A x 2 in preparation for ADL transfers Additional ADL Goal #2: Pt to verbalize at least 3 energy conservation strategies to maximize ADL performance  Plan Discharge plan remains appropriate    Co-evaluation                 AM-PAC OT "6 Clicks" Daily Activity     Outcome Measure   Help from another person eating meals?: A Little Help from another person taking care of personal grooming?: A Little Help from another person toileting, which includes using toliet, bedpan, or urinal?: A Lot Help from another person bathing (including washing, rinsing, drying)?: A Lot Help from another person to put on and taking off regular upper body clothing?: A Lot Help from another person to put on and taking off regular lower body clothing?: A Lot 6 Click Score: 14    End of Session Equipment Utilized During Treatment: Oxygen;Rolling walker  OT  Visit Diagnosis: Unsteadiness on feet (R26.81);Other abnormalities of gait and mobility (R26.89);Muscle weakness (generalized) (M62.81);Pain Pain - part of body:  (back)   Activity Tolerance Patient limited by pain;Patient tolerated treatment well   Patient Left in bed;with call bell/phone within reach;with bed alarm set (in chair position)   Nurse Communication Mobility status;Other (comment) (O2)        Time: 1032-1100 OT Time Calculation (min): 28 min  Charges: OT General Charges $OT Visit: 1 Visit OT Treatments $Self Care/Home Management : 23-37 mins  Helene Kelp OTR/L Acute Rehabilitation Services Office: [place] 04/25/2020, 12:11 PM

## 2020-04-25 NOTE — Progress Notes (Signed)
Subjective:  [name] is a 69 y.o. with PMH of OSH/OSA, diastoilc heart failure, chronic ITP, chronic venous stasis wounds and chronic hypoxic/hypercarbic respiratory failure admit for sepsis on hospital day 7  [name] was examined and evaluated at bedside this am. She mentions that she is continuing to endorse dyspnea. She states that she wants to be discharged to home rather than rehab. Discussed current recommendation to be discharged to SNF for safety. She mentions concern with transportation. [name] expressed understanding and states she will think about i  Objective:  Vital signs in last 24 hours: Vitals:   04/25/20 0345 04/25/20 0400 04/25/20 0800 04/25/20 0821  BP: 129/63 (!) 147/64 (!) 144/65   Pulse:  72 69 78  Resp:  (!) 22 (!) 27 (!) 22  Temp:   97.9 F (36.6 C)   TempSrc:   Oral   SpO2:  97% 97% 94%  Weight:      Height:       Gen: Well-developed, well nourished, NAD HEENT: NCAT head, On heated high flow nasal cannula Pulm: Breathing comfortably on room air, no cough, no distress  Extm: BLE w/ ulcers wrapped in bandaging, chronic skin changes in feet, toenails Neuro: AAOx3  Assessment/Plan:  Principal Problem:   Sepsis due to cellulitis (HCC) Active Problems:   Chronic idiopathic thrombocytopenia (HCC)   Chronic heart failure with preserved ejection fraction with moderate RV failure   Wound of lower extremity, bilateral   Obesity hypoventilation syndrome (HCC)   Chronic respiratory failure with hypoxia and hypercapnia (HCC)   Pressure injury of skin  [name] is a 70 y.o. with PMH of OSH/OSA, diastoilc heart failure, chronic ITP, chronic venous stasis wounds and chronic hypoxic/hypercarbic respiratory failure admit for sepsis   Acute hypoxic, hypercapnic respiratory failure. Obesity Hypoventilation Syndrome Acute on chronic diastolic heart failure [name] continues to exhibit signs of hypercapnia associated with morbid obesity that  is causing thoracic restriction.  Interruption or failure to provide NIV would quickly lead to exacerbation of the patient's condition, hospital admission, and likely harm to the patient. Continued use is preferred.  The use of the NIV will treat patient's high PC02 levels and can reduce risk of exacerbations and future hospitalizations when used at night and during the day. BiLevel/RAD has been considered and ruled out as patient requires continuous alarms, backup battery, and portability which are not possible with BiLevel/RAD devices. Ventilation is required to decrease the work of breathing and improve pulmonary status. Interruption of ventilator support would lead to decline of health status. Patient is able to protect their airways and clear secretions on their own.  On heated high flow nasal cannula 20L. Currently satting 99. Will attempt to wean off. Lasix switched to home oral dose starting today. Continues to exhibit signs of hypercapnea but mentation improving. Co2 37->44.  - Wean down to HFNC as tolerated - Bipap qhs - Duonebs - C/w furosemide 80mg  daily  Sepsis 2/2 lower extremity cellulitis Chronic venous stasis ulcers Afebrile. Vitals stable WBC 10.3->13.7->14.6, likely 2/2 DuoNebs. Finished 7 day course of ceftriaxone - C/w wound care  Morbid obesity PT/OT rec SNF. [name] states she would be willing to consider. Will discuss w/ family regarding current recommendations - Appreciate social work assistance in finding SNF placement.  Prior to Admission Living Arrangement: Home Anticipated Discharge Location: SNF per PT/OT Barriers to Discharge: Medical treatment Dispo: Anticipated discharge in approximately 1-2 day(s).   Evelyn Anis, MD 04/25/2020, 10:22 AM Pager: [phone] After 5pm  on weekdays and 1pm on weekends: On Call Pager: [phone]

## 2020-04-26 LAB — BASIC METABOLIC PANEL
Anion gap: 9 (ref 5–15)
BUN: 11 mg/dL (ref 8–23)
CO2: 44 mmol/L — ABNORMAL HIGH (ref 22–32)
Calcium: 8.7 mg/dL — ABNORMAL LOW (ref 8.9–10.3)
Chloride: 87 mmol/L — ABNORMAL LOW (ref 98–111)
Creatinine, Ser: 0.41 mg/dL — ABNORMAL LOW (ref 0.44–1.00)
GFR calc Af Amer: >60 mL/min (ref >60)
GFR calc non Af Amer: >60 mL/min (ref >60)
Glucose, Bld: 153 mg/dL — ABNORMAL HIGH (ref 70–99)
Potassium: 3.8 mmol/L (ref 3.5–5.1)
Sodium: 140 mmol/L (ref 135–145)

## 2020-04-26 LAB — GLUCOSE, CAPILLARY
Glucose-Capillary: 152 mg/dL — ABNORMAL HIGH (ref 70–99)
Glucose-Capillary: 161 mg/dL — ABNORMAL HIGH (ref 70–99)
Glucose-Capillary: 163 mg/dL — ABNORMAL HIGH (ref 70–99)
Glucose-Capillary: 215 mg/dL — ABNORMAL HIGH (ref 70–99)

## 2020-04-26 LAB — CBC
HCT: 37.9 % (ref 36.0–46.0)
Hemoglobin: 11.1 g/dL — ABNORMAL LOW (ref 12.0–15.0)
MCH: 27.7 pg (ref 26.0–34.0)
MCHC: 29.3 g/dL — ABNORMAL LOW (ref 30.0–36.0)
MCV: 94.5 fL (ref 80.0–100.0)
Platelets: 34 10*3/uL — ABNORMAL LOW (ref 150–400)
RBC: 4.01 MIL/uL (ref 3.87–5.11)
RDW: 14.6 % (ref 11.5–15.5)
WBC: 8.3 10*3/uL (ref 4.0–10.5)
nRBC: 0 % (ref 0.0–0.2)

## 2020-04-26 MED ORDER — FLUTICASONE PROPIONATE 50 MCG/ACT NA SUSP
1.0000 | Freq: Every day | NASAL | Status: AC
  Administered 2020-04-26: 1 via NASAL
  Filled 2020-04-26: qty 16

## 2020-04-26 NOTE — NC FL2 (Signed)
[place] MEDICAID FL2 LEVEL OF CARE SCREENING TOOL     IDENTIFICATION  Patient Name: [name] Birthdate: 1951/04/20 Sex: female Admission Date (Current Location): 04/18/2020  [facility] [place] Number:  Herbalist and Address:  [facility] [place] [address], [place], [place] [address]      Provider Number: [id number]  Attending Physician Name and Address:  Axel Filler, *  Relative Name and Phone Number:  Evette Doffing, [place]    Current Level of Care: Hospital Recommended Level of Care: [place] Prior Approval Number:    Date Approved/Denied:   PASRR Number: [id number]  Discharge Plan: SNF    Current Diagnoses: Patient Active Problem List   Diagnosis Date Noted  . Pressure injury of skin 04/22/2020  . Sepsis due to cellulitis ([place]) 04/18/2020  . Venous stasis dermatitis of both lower extremities 02/25/2019  . Encounter for preventive care 10/20/2018  . Chronic respiratory failure with hypoxia and hypercapnia ([place]) 10/06/2018  . Polycythemia, secondary 10/05/2018  . Obesity hypoventilation syndrome ([place]) 10/04/2018  . BMI 45.0-49.9, adult ([place])   . Bilateral lower extremity edema   . Venous stasis dermatitis 09/30/2018  . Wound of lower extremity, bilateral 09/30/2018  . Chronic heart failure with preserved ejection fraction with moderate RV failure 06/09/2017  . GERD (gastroesophageal reflux disease) 11/13/2014  . Opiate-induced constipation 11/13/2014  . History of tobacco abuse 03/10/2014  . Chronic back pain secondary to traumatic L1 fracture 09/29/2013  . Chronic idiopathic thrombocytopenia ([place]) 08/29/2013  . HTN (hypertension)     Orientation RESPIRATION BLADDER Height & Weight     Self, Time, Situation, Place  O2 Incontinent Weight: 236 lb 12.4 oz (107.4 kg) Height:  5' (152.4 cm)  BEHAVIORAL SYMPTOMS/MOOD NEUROLOGICAL BOWEL NUTRITION STATUS      Continent Diet (heart  healthy, carb modified.  See discharge summary)  AMBULATORY STATUS COMMUNICATION OF NEEDS Skin   Extensive Assist Verbally Normal                       Personal Care Assistance Level of Assistance  Bathing, Feeding, Dressing Bathing Assistance: Maximum assistance Feeding assistance: Limited assistance Dressing Assistance: Maximum assistance     Functional Limitations Info             SPECIAL CARE FACTORS FREQUENCY                       Contractures Contractures Info: Not present    Additional Factors Info  Code Status, Allergies, Insulin Sliding Scale Code Status Info: full Allergies Info: NKA   Insulin Sliding Scale Info: 0-9 units 3x daily with meals       Current Medications (04/26/2020):  This is the current hospital active medication list Current Facility-Administered Medications  Medication Dose Route Frequency Provider Last Rate Last Admin  . acetaminophen (TYLENOL) tablet 650 mg  650 mg Oral Q6H PRN [name], MD   650 mg at 04/25/20 2307  . carvedilol (COREG) tablet 6.25 mg  6.25 mg Oral BID WC Hunsucker, Bonna Gains, MD   6.25 mg at 04/26/20 0827  . diclofenac Sodium (VOLTAREN) 1 % topical gel 2 g  2 g Topical QID PRN [name], MD   2 g at 04/24/20 2141  . furosemide (LASIX) tablet 80 mg  80 mg Oral Daily Sanjuan Dame, MD   80 mg at 04/26/20 0827  . insulin aspart (novoLOG) injection 0-9 Units  0-9  Units Subcutaneous TID WC Hunsucker, Bonna Gains, MD   2 Units at 04/26/20 [phone]  . ipratropium-albuterol (DUONEB) 0.5-2.5 (3) MG/3ML nebulizer solution 3 mL  3 mL Nebulization Q2H PRN Hunsucker, Bonna Gains, MD      . ipratropium-albuterol (DUONEB) 0.5-2.5 (3) MG/3ML nebulizer solution 3 mL  3 mL Nebulization BID Axel Filler, MD   3 mL at 04/26/20 0738  . melatonin tablet 5 mg  5 mg Oral QHS PRN Hunsucker, Bonna Gains, MD   5 mg at 04/25/20 2226  . nystatin (MYCOSTATIN/NYSTOP) topical powder   Topical BID Hunsucker, Bonna Gains, MD    Given at 04/26/20 [phone]  . polyethylene glycol (MIRALAX / GLYCOLAX) packet 17 g  17 g Oral Daily Hunsucker, Bonna Gains, MD   17 g at 04/26/20 0827  . senna-docusate (Senokot-S) tablet 1 tablet  1 tablet Oral QHS Mosetta Anis, MD   1 tablet at 04/25/20 2226     Discharge Medications: Please see discharge summary for a list of discharge medications.  Relevant Imaging Results:  Relevant Lab Results:   Additional Information    Joanne Chars, LCSW

## 2020-04-26 NOTE — Progress Notes (Signed)
   Subjective:  Patient seen at bedside this AM. Reports she is feeling a little better, breathing okay. Discloses she is still open to SNF, but worried about the cost. Otherwise, no complaints.  Objective:  Vital signs in last 24 hours: Vitals:   04/25/20 2200 04/25/20 2259 04/25/20 2307 04/26/20 0400  BP:  (!) 158/76 (!) 158/76 133/62  Pulse:  87 82 63  Resp:  (!) 22 19 17   Temp:  97.8 F (36.6 C)  98.4 F (36.9 C)  TempSrc:  Skin  Skin  SpO2: 94% 95% 95% 97%  Weight:      Height:       Physical Exam: General: Resting in bed, no acute distress Pulm: No increased WOB. MSK: Bilateral legs wrapped with gauze, chronic skin changes on feet.  Assessment/Plan:  Principal Problem:   Sepsis due to cellulitis [facility]) Active Problems:   Chronic idiopathic thrombocytopenia (HCC)   Chronic heart failure with preserved ejection fraction with moderate RV failure   Wound of lower extremity, bilateral   Obesity hypoventilation syndrome (HCC)   Chronic respiratory failure with hypoxia and hypercapnia (HCC)   Pressure injury of skin  Patient is 69yo female with diastolic heart failure, chronic respiratory failure, obesity hypoventilation syndrome, chronic ITP admitted for sepsis, weaning O2 as tolerated.  #Acute hypoxic, hypercapnic respiratory failure #Obesity hypoventilation syndrome #Acute on chronic diastolic heart failure Ms.Calamia continues to exhibit signs of hypercapnia associated with morbid obesity that is causing thoracic restriction.  Interruption or failure to provide NIV would quickly lead to exacerbation of the patient's condition, hospital admission, and likely harm to the patient. Continued use is preferred.  The use of the NIV will treat patient's high PC02 levels and can reduce risk of exacerbations and future hospitalizations when used at night and during the day. BiLevel/RAD has been considered and ruled out as patient requires continuous alarms, backup battery, and  portability which are not possible with BiLevel/RAD devices. Ventilation is required to decrease the work of breathing and improve pulmonary status. Interruption of ventilator support would lead to decline of health status. Patient is able to protect their airways and clear secretions on their own.  Patient on HF [place] 20L this AM. While in room, turned down to 10L satting 93%. Continue attempt to wean off as tolerated. Continue home lasix. Mentation continues to improve. K+, CO2 stable over last 24hrs. -Wean O2 -BiPAP QHS -Duonebs -po lasix 80mg  QD  #Sepsis 2/2 LE cellulitis #Chronic venous stasis ulcers Patient continues to be afebrile, HDS. WBC 8.3 < 14.6 < 13.7.  -S/p 7d Rocephin -Appreciate WOC assistance  #Diability 2/2 morbid obesity Patient continues to be open to SNF. Discussed with daughter yesterday who agreed SNF placement would benefit Ms. Simpson the most. Will continue discussion with Ms. Fugate and work with [place] on appropriate placement when she is medically cleared. Appreciated PT/OT, CSW assistance and recs. -C/w PT/OT  #Chronic ITP PLT 34,000, will continue to trend. DVT prophylaxis if >50,000 -Hold lovenox   Diet: HH IVF: n/a Bowel: Miralax DVT PPX: holding Lovenox 40mg  QD (PLT 34,000) Code Status: Full Prior to Admission Living Arrangement: home Anticipated Discharge Location: SNF v HH Barriers to Discharge: hypoxia Dispo: Anticipated discharge in approximately 2-3 day(s).   Sanjuan Dame, MD 04/26/2020, 6:26 AM Pager: [phone] After 5pm on weekdays and 1pm on weekends: On Call pager [phone]

## 2020-04-26 NOTE — Progress Notes (Signed)
RT NOTES: Removed patient from bipap and placed on HHFNC 20L/60%.

## 2020-04-26 NOTE — TOC Progression Note (Signed)
Transition of Care [facility]) - Progression Note    Patient Details  Name: [name] MRN: [id number] Date of Birth: [birth year]  [facility]) CM/SW Contact  Joanne Chars, LCSW Phone Number: 04/26/2020, 10:50 AM  Clinical Narrative: Message from MD that he spoke with pt yesterday and pt is now willing to consider SNF.  Confirmed this with pt, who is agreeable if she can go to a "nice" place.  FL2/PASSR completed and pt sent out in hub.      Expected Discharge Plan: [place] Barriers to Discharge: Continued Medical Work up  Expected Discharge Plan and Services Expected Discharge Plan: [place] Choice: [place] arrangements for the past 2 months: Single Family Home                                       Social Determinants of Health (SDOH) Interventions    Readmission Risk Interventions No flowsheet data found.

## 2020-04-27 LAB — CBC
HCT: 38.5 % (ref 36.0–46.0)
Hemoglobin: 11.3 g/dL — ABNORMAL LOW (ref 12.0–15.0)
MCH: 27.8 pg (ref 26.0–34.0)
MCHC: 29.4 g/dL — ABNORMAL LOW (ref 30.0–36.0)
MCV: 94.8 fL (ref 80.0–100.0)
Platelets: 45 10*3/uL — ABNORMAL LOW (ref 150–400)
RBC: 4.06 MIL/uL (ref 3.87–5.11)
RDW: 14.7 % (ref 11.5–15.5)
WBC: 8.3 10*3/uL (ref 4.0–10.5)
nRBC: 0 % (ref 0.0–0.2)

## 2020-04-27 LAB — BASIC METABOLIC PANEL
Anion gap: 5 (ref 5–15)
BUN: 11 mg/dL (ref 8–23)
CO2: 49 mmol/L — ABNORMAL HIGH (ref 22–32)
Calcium: 9 mg/dL (ref 8.9–10.3)
Chloride: 86 mmol/L — ABNORMAL LOW (ref 98–111)
Creatinine, Ser: 0.42 mg/dL — ABNORMAL LOW (ref 0.44–1.00)
GFR calc Af Amer: >60 mL/min (ref >60)
GFR calc non Af Amer: >60 mL/min (ref >60)
Glucose, Bld: 198 mg/dL — ABNORMAL HIGH (ref 70–99)
Potassium: 4.1 mmol/L (ref 3.5–5.1)
Sodium: 140 mmol/L (ref 135–145)

## 2020-04-27 LAB — GLUCOSE, CAPILLARY
Glucose-Capillary: 150 mg/dL — ABNORMAL HIGH (ref 70–99)
Glucose-Capillary: 230 mg/dL — ABNORMAL HIGH (ref 70–99)
Glucose-Capillary: 104 mg/dL — ABNORMAL HIGH (ref 70–99)
Glucose-Capillary: 189 mg/dL — ABNORMAL HIGH (ref 70–99)

## 2020-04-27 NOTE — Progress Notes (Signed)
Subjective:  Patient seen at bedside this AM. States she's doing okay, slowly improving. Reports she did not get out of bed yesterday but is willing to do so with physical therapy. No other complaints.  Objective:  Vital signs in last 24 hours: Vitals:   04/26/20 2315 04/26/20 2335 04/27/20 0322 04/27/20 0343  BP: (!) 151/69 (!) 151/69 (!) 149/63 (!) 149/63  Pulse: 83 78 75 80  Resp: 20 16 20  (!) 23  Temp: 99.1 F (37.3 C)  97.6 F (36.4 C)   TempSrc: Oral  Axillary   SpO2: 93% 97% 97% 94%  Weight:      Height:       Physical Exam: General: Resting in bed, no acute distress. Pulm: Difficult to assess with effort and body habitus, but no rales heard bilaterally. MSK: Bilateral legs wrapped with gauze  Assessment/Plan:  Principal Problem:   Sepsis due to cellulitis [facility]) Active Problems:   Chronic idiopathic thrombocytopenia (HCC)   Chronic heart failure with preserved ejection fraction with moderate RV failure   Wound of lower extremity, bilateral   Obesity hypoventilation syndrome (HCC)   Chronic respiratory failure with hypoxia and hypercapnia (HCC)   Pressure injury of skin  Patient is 69yo female with diastolic heart failure, chronic respiratory failure, obesity hypoventilation syndrome, chronic ITP admitted for sepsis, weaning O2 as tolerated.  #Acute hypoxic, hypercapnic respiratory failure #Obesity hypoventilation syndrome #Acute on chronic diastolic heart failure Ms.Boernercontinues to exhibit signs of hypercapnia associated with morbid obesity that is causing thoracic restriction. Interruption or failure to provide NIV would quickly lead to exacerbation of the patient's condition, hospital admission, and likely harm to the patient. Continued use is preferred. The use of the NIV will treat patient's high PC02 levels and can reduce risk of exacerbations and future hospitalizations when used at night and during the day. BiLevel/RAD has been considered and ruled out  as patient requires continuous alarms, backup battery, and portability which are not possible with BiLevel/RAD devices. Ventilation is required to decrease the work of breathing and improve pulmonary status. Interruption of ventilator support would lead to decline of health status. Patient is able to protect their airways and clear secretions on their own.  Patient on 6L [place] this AM, sating well. Continue to work with respiratory therapy, nursing to wean down as tolerated. Will need to continue hospital-level care until she is stable at 4L [place]. Mentation improved. Ordered spirometer, encouraged patient to use often to improve respiratory function. Continuing discussions with patient regarding SNF. It is my opinion that the best option for long-term success and avoidance of further exacerbations, hospitalizations would be rehabilitation at [facility]. Discussed plan with patient's daughter, who stated it is difficult for her to take care of the patient and she believes SNF to be the most beneficial option. Patient is hesitant, but agreeable to SNF at this time. Appreciate assistance of PT/OT, CSW. -Wean O2 -BiPAP QHS -DuoNebs -Incentive spirometry -po lasix 80mg  QD  -PT/OT  #Sepsis 2/2 LE cellulitis #Chronic venous stasis ulcers Patient afebrile, HDS. WBC 8.3 < 8.3.  -S/p 7d Rocephin -Appreciate WOC assistance  #Chronic ITP PLT 45,000. Holding DVT prophylaxis -Hold lovenox  Diet: HH IVF: n/a Bowel: Miralax, tap water enema PRN DVT PPX: holding Lovenox (PLT 45,000) Code Status: Full Prior to Admission Living Arrangement: home Anticipated Discharge Location: SNF Barriers to Discharge: hypoxia Dispo: Anticipated discharge in approximately 2-3 day(s).   Sanjuan Dame, MD 04/27/2020, 6:03 AM Pager: [phone] After 5pm on weekdays and 1pm on  weekends: On Call pager [phone]

## 2020-04-27 NOTE — Progress Notes (Signed)
OT Cancellation Note  Patient Details Name: [name] MRN: [id number] DOB: 1951/07/27   Cancelled Treatment:    Reason Eval/Treat Not Completed: Patient declined, no reason specified. Pt declined x2, reporting fatigue, not sleeping well last night, and not wanting to do therapy today. Pt reports "Im a stubborn old woman".   Layla Maw 04/27/2020, 2:48 PM

## 2020-04-27 NOTE — Progress Notes (Signed)
RT NOTES: Removed patient from heated high flow and placed on 15L [place] HFNC. Sats 98%. Will continue to monitor.

## 2020-04-27 NOTE — TOC Progression Note (Signed)
Transition of Care [facility]) - Progression Note    Patient Details  Name: [name] MRN: [id number] Date of Birth: [birth year]  Transition of Care [facility]) CM/SW Contact  Joanne Chars, LCSW Phone Number: 04/27/2020, 1:45 PM  Clinical Narrative:   Pt accepts bed offer for [facility].  [employer] is managed by [employer] request faxed in.    Expected Discharge Plan: [place] Barriers to Discharge: Continued Medical Work up  Expected Discharge Plan and Services Expected Discharge Plan: [place] Choice: [place] arrangements for the past 2 months: Single Family Home                                       Social Determinants of Health (SDOH) Interventions    Readmission Risk Interventions No flowsheet data found.

## 2020-04-27 NOTE — Progress Notes (Signed)
PT Cancellation Note  Patient Details Name: [name] MRN: [id number] DOB: 26-Mar-1951   Cancelled Treatment:    Reason Eval/Treat Not Completed: Patient declined, no reason specified patient adamantly declines working with therapies today- when asked as to why not, she seemed to get a bit agitated and stated "I'm 69 years old, if I don't want to do something I don't have to". Continues to refuse despite multiple offers of types of activities and bargaining- keeps stating, "I will do it when I get to the SNF". Wouldn't even do bed level exercises today. Will continue to follow acutely.    Windell Norfolk, DPT, PN1   Supplemental Physical Therapist [facility]    Pager [phone] Acute Rehab Office [phone]

## 2020-04-28 LAB — CBC
HCT: 41.4 % (ref 36.0–46.0)
Hemoglobin: 12.3 g/dL (ref 12.0–15.0)
MCH: 28.3 pg (ref 26.0–34.0)
MCHC: 29.7 g/dL — ABNORMAL LOW (ref 30.0–36.0)
MCV: 95.4 fL (ref 80.0–100.0)
Platelets: 52 10*3/uL — ABNORMAL LOW (ref 150–400)
RBC: 4.34 MIL/uL (ref 3.87–5.11)
RDW: 15 % (ref 11.5–15.5)
WBC: 8.9 10*3/uL (ref 4.0–10.5)
nRBC: 0 % (ref 0.0–0.2)

## 2020-04-28 LAB — GLUCOSE, CAPILLARY
Glucose-Capillary: 138 mg/dL — ABNORMAL HIGH (ref 70–99)
Glucose-Capillary: 177 mg/dL — ABNORMAL HIGH (ref 70–99)
Glucose-Capillary: 128 mg/dL — ABNORMAL HIGH (ref 70–99)
Glucose-Capillary: 160 mg/dL — ABNORMAL HIGH (ref 70–99)

## 2020-04-28 LAB — BASIC METABOLIC PANEL
Anion gap: 9 (ref 5–15)
BUN: 11 mg/dL (ref 8–23)
CO2: 45 mmol/L — ABNORMAL HIGH (ref 22–32)
Calcium: 9.2 mg/dL (ref 8.9–10.3)
Chloride: 86 mmol/L — ABNORMAL LOW (ref 98–111)
Creatinine, Ser: 0.47 mg/dL (ref 0.44–1.00)
GFR calc Af Amer: >60 mL/min (ref >60)
GFR calc non Af Amer: >60 mL/min (ref >60)
Glucose, Bld: 142 mg/dL — ABNORMAL HIGH (ref 70–99)
Potassium: 4.1 mmol/L (ref 3.5–5.1)
Sodium: 140 mmol/L (ref 135–145)

## 2020-04-28 MED ORDER — SENNOSIDES-DOCUSATE SODIUM 8.6-50 MG PO TABS
2.0000 | ORAL_TABLET | Freq: Two times a day (BID) | ORAL | Status: DC
  Filled 2020-04-28 (×3): qty 2

## 2020-04-28 MED ORDER — ENOXAPARIN SODIUM 40 MG/0.4ML ~~LOC~~ SOLN
40.0000 mg | Freq: Once | SUBCUTANEOUS | Status: AC
  Administered 2020-04-28: 40 mg via SUBCUTANEOUS
  Filled 2020-04-28: qty 0.4

## 2020-04-28 MED ORDER — MAGNESIUM CITRATE PO SOLN
1.0000 | Freq: Once | ORAL | Status: AC
  Administered 2020-04-28: 1 via ORAL
  Filled 2020-04-28: qty 296

## 2020-04-28 NOTE — TOC Progression Note (Signed)
Transition of Care [facility]) - Progression Note    Patient Details  Name: [name] MRN: [id number] Date of Birth: [birth year]  Transition of Care [facility]) CM/SW [place], [place] Phone Number: 04/28/2020, 1:43 PM  Clinical Narrative:    CSW contacted [facility] to inquire about patient discharging to the facility. CSW informed they do not take trilogy at the facility. CSW contacted [facility] SNF to inquire and left a voicemail with the admissions liaison. CSW called the facility directly and they were unable to verify if they are able to take trilogy. CSW updated MD.   Expected Discharge Plan: [place] Barriers to Discharge: Continued Medical Work up  Expected Discharge Plan and Services Expected Discharge Plan: [place] arrangements for the past 2 months: Single Family Home Expected Discharge Date: 04/28/20                                     Social Determinants of Health (SDOH) Interventions    Readmission Risk Interventions No flowsheet data found.

## 2020-04-28 NOTE — Progress Notes (Signed)
Disposition confirmed this evening with [name]'s nurse that pt is not going home tonight or over the weekend; SW note earlier this afternoon had indicated she may be discharged to home with [facility] services today, as SNF declined admission due to inability to accept Trilogy respiratory device.

## 2020-04-28 NOTE — Progress Notes (Signed)
Subjective:   [name] was examined and evaluated at bedside this am. Mentions that she was told that her SNF location has been confirmed. Discussed plan to continue to down-titrate her oxygen. Ms.Crago states her breathing appears to be improving  Objective:  Vital signs in last 24 hours: Vitals:   04/28/20 0023 04/28/20 0300 04/28/20 0344 04/28/20 0414  BP: 137/67 (!) 147/78  (!) 158/76  Pulse: 74 81 79 76  Resp:  16 16 19   Temp: 98.7 F (37.1 C) 98.1 F (36.7 C)  98 F (36.7 C)  TempSrc: Oral Axillary  Axillary  SpO2: 99% 93%  98%  Weight:  102.8 kg    Height:       Physical Exam: General: Patient lying in bed on supp O2, no acute distress CV: Regular rate, rhythm. No murmurs, rubs, gallops. Abdomen: Distended, tympanic on percussion. Abdominal discomfort. MSK: Bilateral legs wrapped with gauze, chronic skin changes on feet. Closed, non-blanching erythematous ulcer on L lateral heel.   Assessment/Plan:  Principal Problem:   Sepsis due to cellulitis [facility]) Active Problems:   Chronic idiopathic thrombocytopenia (HCC)   Chronic heart failure with preserved ejection fraction with moderate RV failure   Wound of lower extremity, bilateral   Obesity hypoventilation syndrome (HCC)   Chronic respiratory failure with hypoxia and hypercapnia (HCC)   Pressure injury of skin  Patient is 69yo female with diastolic heart failure, chronic respiratory failure, obesity hypoventilation syndrome, chronic ITP admitted for acute hypoxic, hypercapnic respiratory failure 2/2 sepsis d/t LE cellulitis.  #Acute hypoxic, hypercapnic respiratory failure #Obesity hypoventilation syndrome #Acute on chronic diastolic heart failure Ms.Boernercontinues to exhibit signs of hypercapnia associated with morbid obesity that is causing thoracic restriction. Interruption or failure to provide NIV would quickly lead to exacerbation of the patient's condition, hospital admission, and likely harm to the  patient. Continued use is preferred. The use of the NIV will treat patient's high PC02 levels and can reduce risk of exacerbations and future hospitalizations when used at night and during the day. BiLevel/RAD has been considered and ruled out as patient requires continuous alarms, backup battery, and portability which are not possible with BiLevel/RAD devices. Ventilation is required to decrease the work of breathing and improve pulmonary status. Interruption of ventilator support would lead to decline of health status. Patient is able to protect their airways and clear secretions on their own.  Patient continues to do well. Decreased to 3L [place] today and sating well. She is on 2L O2 at home. Patient reports she is feeling much better than when she arrived. She is stable, plan to d/c to SNF on supp O2. -BiPAP QHS -DuoNebs -Incentive spirometry -C/w home po lasix 80mg  QD -PT/OT  #Sepsis 2/2 LE cellulitis #Chronic venous stasis ulcers Afebrile, HDS. WBC 8.9 < 8.3 -s/p 7d Rocephin -Appreciate WOC assistance  #Stage 1 pressure ulcer On exam, patient with pressure ulcer on L lateral heel. Encouraged patient to work with PT/OT and get out of bed when possible. -K pad  #Constipation Patient has hx of intermittent constipation 2/2 opioid use for back pain. This morning, endorses abdominal discomfort, says she has not had BM in a couple days. Will add extra laxative. -Miralax -Magnesium citrate   #Chronic ITP PLT 52,000 this AM -DVT PPX today  Diet: HH IVF: n/a Bowel: Miralax DVT PPX: Lovenox 40mg  QD (PLT 52,000) Code Status: Full Prior to Admission Living Arrangement: home Anticipated Discharge Location: SNF ([place]) Barriers to Discharge: n/a Dispo: Anticipated discharge in approximately  0-1 day(s).   Sanjuan Dame, MD 04/28/2020, 5:21 AM Pager: [phone] After 5pm on weekdays and 1pm on weekends: On Call pager [phone]

## 2020-04-28 NOTE — Progress Notes (Signed)
   04/28/20 1900  Assess: MEWS Score  Pulse Rate 79  ECG Heart Rate 81  Resp (!) 27  SpO2 94 %  Assess: MEWS Score  MEWS Temp 0  MEWS Systolic 0  MEWS Pulse 0  MEWS RR 2  MEWS LOC 0  MEWS Score 2  MEWS Score Color Yellow  Assess: if the MEWS score is Yellow or Red  Were vital signs taken at a resting state? Yes  Focused Assessment No change from prior assessment  Early Detection of Sepsis Score *See Row Information* Low  MEWS guidelines implemented *See Row Information* No, vital signs rechecked (pt tachypneic, will recheck RR)

## 2020-04-28 NOTE — Discharge Instructions (Signed)
Cellulitis, Adult  Cellulitis is a skin infection. The infected area is often warm, red, swollen, and sore. It occurs most often in the arms and lower legs. It is very important to get treated for this condition. What are the causes? This condition is caused by bacteria. The bacteria enter through a break in the skin, such as a cut, burn, insect bite, open sore, or crack. What increases the risk? This condition is more likely to occur in people who:  Have a weak body defense system (immune system).  Have open cuts, burns, bites, or scrapes on the skin.  Are older than 67 years of age.  Have a blood sugar problem (diabetes).  Have a long-lasting (chronic) liver disease (cirrhosis) or kidney disease.  Are very overweight (obese).  Have a skin problem, such as: ? Itchy rash (eczema). ? Slow movement of blood in the veins (venous stasis). ? Fluid buildup below the skin (edema).  Have been treated with high-energy rays (radiation).  Use IV drugs. What are the signs or symptoms? Symptoms of this condition include:  Skin that is: ? Red. ? Streaking. ? Spotting. ? Swollen. ? Sore or painful when you touch it. ? Warm.  A fever.  Chills.  Blisters. How is this diagnosed? This condition is diagnosed based on:  Medical history.  Physical exam.  Blood tests.  Imaging tests. How is this treated? Treatment for this condition may include:  Medicines to treat infections or allergies.  Home care, such as: ? Rest. ? Placing cold or warm cloths (compresses) on the skin.  Hospital care, if the condition is very bad. Follow these instructions at home: Medicines  Take over-the-counter and prescription medicines only as told by your doctor.  If you were prescribed an antibiotic medicine, take it as told by your doctor. Do not stop taking it even if you start to feel better. General instructions   Drink enough fluid to keep your pee (urine) pale yellow.  Do not touch  or rub the infected area.  Raise (elevate) the infected area above the level of your heart while you are sitting or lying down.  Place cold or warm cloths on the area as told by your doctor.  Keep all follow-up visits as told by your doctor. This is important. Contact a doctor if:  You have a fever.  You do not start to get better after 1-2 days of treatment.  Your bone or joint under the infected area starts to hurt after the skin has healed.  Your infection comes back. This can happen in the same area or another area.  You have a swollen bump in the area.  You have new symptoms.  You feel ill and have muscle aches and pains. Get help right away if:  Your symptoms get worse.  You feel very sleepy.  You throw up (vomit) or have watery poop (diarrhea) for a long time.  You see red streaks coming from the area.  Your red area gets larger.  Your red area turns dark in color. These symptoms may represent a serious problem that is an emergency. Do not wait to see if the symptoms will go away. Get medical help right away. Call your local emergency services (911 in the [place] Do not drive yourself to the hospital. Summary  Cellulitis is a skin infection. The area is often warm, red, swollen, and sore.  This condition is treated with medicines, rest, and cold and warm cloths.  Take all medicines only   as told by your doctor.  Tell your doctor if symptoms do not start to get better after 1-2 days of treatment. This information is not intended to replace advice given to you by your health care provider. Make sure you discuss any questions you have with your health care provider. Document Revised: 01/21/2018 Document Reviewed: 01/21/2018 Elsevier Patient Education  [place].   Acute Respiratory Failure, Adult  Acute respiratory failure occurs when there is not enough oxygen passing from your lungs to your body. When this happens, your lungs have trouble removing  carbon dioxide from the blood. This causes your blood oxygen level to drop too low as carbon dioxide builds up. Acute respiratory failure is a medical emergency. It can develop quickly, but it is temporary if treated promptly. Your lung capacity, or how much air your lungs can hold, may improve with time, exercise, and treatment. What are the causes? There are many possible causes of acute respiratory failure, including:  Lung injury.  Chest injury or damage to the ribs or tissues near the lungs.  Lung conditions that affect the flow of air and blood into and out of the lungs, such as pneumonia, acute respiratory distress syndrome, and cystic fibrosis.  Medical conditions, such as strokes or spinal cord injuries, that affect the muscles and nerves that control breathing.  Blood infection (sepsis).  Inflammation of the pancreas (pancreatitis).  A blood clot in the lungs (pulmonary embolism).  A large-volume blood transfusion.  Burns.  Near-drowning.  Seizure.  Smoke inhalation.  Reaction to medicines.  Alcohol or drug overdose. What increases the risk? This condition is more likely to develop in people who have:  A blocked airway.  Asthma.  A condition or disease that damages or weakens the muscles, nerves, bones, or tissues that are involved in breathing.  A serious infection.  A health problem that blocks the unconscious reflex that is involved in breathing, such as hypothyroidism or sleep apnea.  A lung injury or trauma. What are the signs or symptoms? Trouble breathing is the main symptom of acute respiratory failure. Symptoms may also include:  Rapid breathing.  Restlessness or anxiety.  Skin, lips, or fingernails that appear blue (cyanosis).  Rapid heart rate.  Abnormal heart rhythms (arrhythmias).  Confusion or changes in behavior.  Tiredness or loss of energy.  Feeling sleepy or having a loss of consciousness. How is this diagnosed? Your health  care provider can diagnose acute respiratory failure with a medical history and physical exam. During the exam, your health care provider will listen to your heart and check for crackling or wheezing sounds in your lungs. Your may also have tests to confirm the diagnosis and determine what is causing respiratory failure. These tests may include:  Measuring the amount of oxygen in your blood (pulse oximetry). The measurement comes from a small device that is placed on your finger, earlobe, or toe.  Other blood tests to measure blood gases and to look for signs of infection.  Sampling your cerebral spinal fluid or tracheal fluid to check for infections.  Chest X-ray to look for fluid in spaces that should be filled with air.  Electrocardiogram (ECG) to look at the heart's electrical activity. How is this treated? Treatment for this condition usually takes places in a hospital intensive care unit (ICU). Treatment depends on what is causing the condition. It may include one or more treatments until your symptoms improve. Treatment may include:  Supplemental oxygen. Extra oxygen is given through a  tube in the nose, a face mask, or a hood.  A device such as a continuous positive airway pressure (CPAP) or bi-level positive airway pressure (BiPAP or BPAP) machine. This treatment uses mild air pressure to keep the airways open. A mask or other device will be placed over your nose or mouth. A tube that is connected to a motor will deliver oxygen through the mask.  Ventilator. This treatment helps move air into and out of the lungs. This may be done with a bag and mask or a machine. For this treatment, a tube is placed in your windpipe (trachea) so air and oxygen can flow to the lungs.  Extracorporeal membrane oxygenation (ECMO). This treatment temporarily takes over the function of the heart and lungs, supplying oxygen and removing carbon dioxide. ECMO gives the lungs a chance to recover. It may be used if  a ventilator is not effective.  Tracheostomy. This is a procedure that creates a hole in the neck to insert a breathing tube.  Receiving fluids and medicines.  Rocking the bed to help breathing. Follow these instructions at home:  Take over-the-counter and prescription medicines only as told by your health care provider.  Return to normal activities as told by your health care provider. Ask your health care provider what activities are safe for you.  Keep all follow-up visits as told by your health care provider. This is important. How is this prevented? Treating infections and medical conditions that may lead to acute respiratory failure can help prevent the condition from developing. Contact a health care provider if:  You have a fever.  Your symptoms do not improve or they get worse. Get help right away if:  You are having trouble breathing.  You lose consciousness.  Your have cyanosis or turn blue.  You develop a rapid heart rate.  You are confused. These symptoms may represent a serious problem that is an emergency. Do not wait to see if the symptoms will go away. Get medical help right away. Call your local emergency services (911 in the [place] Do not drive yourself to the hospital. This information is not intended to replace advice given to you by your health care provider. Make sure you discuss any questions you have with your health care provider. Document Revised: 08/14/2017 Document Reviewed: 03/19/2016 Elsevier Patient Education  [place].

## 2020-04-28 NOTE — Progress Notes (Signed)
   04/28/20 2200  Assess: MEWS Score  Pulse Rate 80  ECG Heart Rate 81  Resp (!) 31  SpO2 95 %  Assess: MEWS Score  MEWS Temp 0  MEWS Systolic 0  MEWS Pulse 0  MEWS RR 2  MEWS LOC 0  MEWS Score 2  MEWS Score Color Yellow  Assess: if the MEWS score is Yellow or Red  Were vital signs taken at a resting state? Yes  Focused Assessment No change from prior assessment  Early Detection of Sepsis Score *See Row Information* Low  MEWS guidelines implemented *See Row Information* No, vital signs rechecked (Pt tachypneic; will recheck RR)

## 2020-04-29 LAB — BASIC METABOLIC PANEL
Anion gap: 8 (ref 5–15)
BUN: 13 mg/dL (ref 8–23)
CO2: 45 mmol/L — ABNORMAL HIGH (ref 22–32)
Calcium: 9 mg/dL (ref 8.9–10.3)
Chloride: 88 mmol/L — ABNORMAL LOW (ref 98–111)
Creatinine, Ser: 0.47 mg/dL (ref 0.44–1.00)
GFR calc Af Amer: >60 mL/min (ref >60)
GFR calc non Af Amer: >60 mL/min (ref >60)
Glucose, Bld: 139 mg/dL — ABNORMAL HIGH (ref 70–99)
Potassium: 5.1 mmol/L (ref 3.5–5.1)
Sodium: 141 mmol/L (ref 135–145)

## 2020-04-29 LAB — GLUCOSE, CAPILLARY
Glucose-Capillary: 202 mg/dL — ABNORMAL HIGH (ref 70–99)
Glucose-Capillary: 170 mg/dL — ABNORMAL HIGH (ref 70–99)
Glucose-Capillary: 93 mg/dL (ref 70–99)
Glucose-Capillary: 136 mg/dL — ABNORMAL HIGH (ref 70–99)

## 2020-04-29 MED ORDER — ENOXAPARIN SODIUM 40 MG/0.4ML ~~LOC~~ SOLN
40.0000 mg | SUBCUTANEOUS | Status: DC
  Administered 2020-04-29: 40 mg via SUBCUTANEOUS
  Filled 2020-04-29: qty 0.4

## 2020-04-29 NOTE — Progress Notes (Signed)
   04/29/20 2022  Assess: MEWS Score  BP (!) 123/55  Pulse Rate 77  ECG Heart Rate 80  Resp (!) 34  SpO2 94 %  Assess: MEWS Score  MEWS Temp 0  MEWS Systolic 0  MEWS Pulse 0  MEWS RR 2  MEWS LOC 0  MEWS Score 2  MEWS Score Color Yellow  Assess: if the MEWS score is Yellow or Red  Were vital signs taken at a resting state? No (Pt turned & being cleaned up; incr. RR. will recheck)  Focused Assessment No change from prior assessment  Early Detection of Sepsis Score *See Row Information* Low  MEWS guidelines implemented *See Row Information* No, vital signs rechecked

## 2020-04-29 NOTE — Progress Notes (Signed)
   04/29/20 2300  Assess: MEWS Score  Pulse Rate 87  ECG Heart Rate 88  Resp (!) 26  SpO2 97 %  Assess: MEWS Score  MEWS Temp 0  MEWS Systolic 0  MEWS Pulse 0  MEWS RR 2  MEWS LOC 0  MEWS Score 2  MEWS Score Color Yellow  Assess: if the MEWS score is Yellow or Red  Were vital signs taken at a resting state? Yes  Focused Assessment No change from prior assessment  Early Detection of Sepsis Score *See Row Information* Low  MEWS guidelines implemented *See Row Information* No, vital signs rechecked (Pt intermittently tachypneic; will recheck RR)

## 2020-04-29 NOTE — Progress Notes (Signed)
   04/29/20 2020  Assess: MEWS Score  Temp 98.2 F (36.8 C)  Pulse Rate 80  ECG Heart Rate 80  Resp (!) 28  SpO2 95 %  Assess: MEWS Score  MEWS Temp 0  MEWS Systolic 0  MEWS Pulse 0  MEWS RR 2  MEWS LOC 0  MEWS Score 2  MEWS Score Color Yellow  Assess: if the MEWS score is Yellow or Red  Were vital signs taken at a resting state? No (Pt turned & being cleaned up; incr. RR. will recheck)  Focused Assessment No change from prior assessment  Early Detection of Sepsis Score *See Row Information* Low  MEWS guidelines implemented *See Row Information* No, vital signs rechecked

## 2020-04-29 NOTE — Progress Notes (Signed)
   04/29/20 0100  Assess: MEWS Score  Pulse Rate 72  ECG Heart Rate 71  Resp (!) 26  SpO2 98 %  Assess: MEWS Score  MEWS Temp 0  MEWS Systolic 0  MEWS Pulse 0  MEWS RR 2  MEWS LOC 0  MEWS Score 2  MEWS Score Color Yellow  Assess: if the MEWS score is Yellow or Red  Were vital signs taken at a resting state? Yes  Focused Assessment No change from prior assessment  Early Detection of Sepsis Score *See Row Information* Low  MEWS guidelines implemented *See Row Information* No, vital signs rechecked (pt slightly tachypneic; will recheck RR)

## 2020-04-29 NOTE — Progress Notes (Signed)
   Subjective:  [name] is a 69 y.o. with PMH of diastolic heart failure,  admit for acute hypoxic respiratory failure on hospital day 95  [name] was examined and evaluated at bedside this am. Plan was for her to discharge to SNF overnight but was informed SNF refused admission last minute as they do not take patients on trilogy. She mentions feeling well and has no acute complaints this am. She mentions having difficulty with tolerating Bipap overnight due to claustrophobia. Denies any other significant complaint.  Objective:  Vital signs in last 24 hours: Vitals:   04/29/20 0400 04/29/20 0500 04/29/20 0600 04/29/20 0819  BP: 137/62   128/65  Pulse: 71 75 73 77  Resp: (!) 21 20 (!) 22 (!) 24  Temp:      TempSrc:      SpO2: 100% 100% 94% 90%  Weight:      Height:       Gen: Well-developed, NAD CV: RRR, S1, S2 normal, No rubs, no murmurs, no gallops Pulm: Distant breath sounds, no rales Abd: Soft, BS+, NTND Extm: ROM intact, Bilateral lower extremities covered in bandaging  Assessment/Plan:  Principal Problem:   Sepsis due to cellulitis [name]) Active Problems:   Chronic idiopathic thrombocytopenia (HCC)   Chronic heart failure with preserved ejection fraction with moderate RV failure   Wound of lower extremity, bilateral   Obesity hypoventilation syndrome (HCC)   Chronic respiratory failure with hypoxia and hypercapnia (HCC)   Pressure injury of skin  [name] is 69yo female with diastolic heart failure, chronic respiratory failure, obesity hypoventilation syndrome, chronic ITP admitted for acute hypoxic, hypercapnic respiratory failure 2/2 sepsis d/t LE cellulitis.  #Acute on chronic hypoxic, hypercapnic respiratory failure (resolved) #Obesity hypoventilation syndrome #OSA #COPD Significantly improved respiratory status. Came off high flow 2 days prior. Was able to wean down to 3L yesterday. Currently on home oxygen requirement. Plan was for d/c to SNF  yesterday but SNF refused due to patient's use of Trilogy ventilator. Unfortunately patient needs positive pressure but unable to qualify for CPAP at home w/o sleep study but Trilogy makes finding placement difficult. Possibly can have sleep study done in outpatient setting while at SNF. Will trial CPAP at nightime only -CPAP QHS -DuoNebs -Incentive spirometry -C/w home po lasix 80mg  QD  #Sepsis 2/2 LE cellulitis (resolved) #Chronic venous stasis ulcers Afebrile, HDS. -s/p 7d Rocephin -Appreciate WOC assistance  #Chronic ITP PLT 52,000 this AM -DVT PPX today  DVT prophx: lovenox Diet: HH/CM Bowel: senokot Code: Full  Prior to Admission Living Arrangement: Home Anticipated Discharge Location: SNF Barriers to Discharge: placement Dispo: Anticipated discharge in approximately 2-3 day(s).   Mosetta Anis, MD 04/29/2020, 9:32 AM Pager: [phone] After 5pm on weekdays and 1pm on weekends: On Call Pager: [phone]

## 2020-04-30 DIAGNOSIS — L03115 Cellulitis of right lower limb: Secondary | ICD-10-CM

## 2020-04-30 DIAGNOSIS — M62838 Other muscle spasm: Secondary | ICD-10-CM | POA: Diagnosis not present

## 2020-04-30 DIAGNOSIS — L89891 Pressure ulcer of other site, stage 1: Secondary | ICD-10-CM | POA: Diagnosis not present

## 2020-04-30 DIAGNOSIS — I5033 Acute on chronic diastolic (congestive) heart failure: Secondary | ICD-10-CM

## 2020-04-30 DIAGNOSIS — E662 Morbid (severe) obesity with alveolar hypoventilation: Secondary | ICD-10-CM | POA: Diagnosis not present

## 2020-04-30 DIAGNOSIS — D693 Immune thrombocytopenic purpura: Secondary | ICD-10-CM | POA: Diagnosis not present

## 2020-04-30 DIAGNOSIS — L0390 Cellulitis, unspecified: Secondary | ICD-10-CM | POA: Diagnosis not present

## 2020-04-30 DIAGNOSIS — Z743 Need for continuous supervision: Secondary | ICD-10-CM | POA: Diagnosis not present

## 2020-04-30 DIAGNOSIS — I10 Essential (primary) hypertension: Secondary | ICD-10-CM | POA: Diagnosis not present

## 2020-04-30 DIAGNOSIS — L03116 Cellulitis of left lower limb: Secondary | ICD-10-CM | POA: Diagnosis not present

## 2020-04-30 DIAGNOSIS — G8929 Other chronic pain: Secondary | ICD-10-CM | POA: Diagnosis not present

## 2020-04-30 DIAGNOSIS — R0902 Hypoxemia: Secondary | ICD-10-CM | POA: Diagnosis not present

## 2020-04-30 DIAGNOSIS — J9611 Chronic respiratory failure with hypoxia: Secondary | ICD-10-CM | POA: Diagnosis not present

## 2020-04-30 DIAGNOSIS — M2550 Pain in unspecified joint: Secondary | ICD-10-CM | POA: Diagnosis not present

## 2020-04-30 DIAGNOSIS — M6281 Muscle weakness (generalized): Secondary | ICD-10-CM | POA: Diagnosis not present

## 2020-04-30 DIAGNOSIS — J9622 Acute and chronic respiratory failure with hypercapnia: Secondary | ICD-10-CM | POA: Diagnosis not present

## 2020-04-30 DIAGNOSIS — Z6841 Body Mass Index (BMI) 40.0 and over, adult: Secondary | ICD-10-CM | POA: Diagnosis not present

## 2020-04-30 DIAGNOSIS — Z7401 Bed confinement status: Secondary | ICD-10-CM | POA: Diagnosis not present

## 2020-04-30 DIAGNOSIS — K5909 Other constipation: Secondary | ICD-10-CM | POA: Diagnosis not present

## 2020-04-30 DIAGNOSIS — A419 Sepsis, unspecified organism: Secondary | ICD-10-CM | POA: Diagnosis not present

## 2020-04-30 DIAGNOSIS — J9612 Chronic respiratory failure with hypercapnia: Secondary | ICD-10-CM | POA: Diagnosis not present

## 2020-04-30 DIAGNOSIS — M5489 Other dorsalgia: Secondary | ICD-10-CM | POA: Diagnosis not present

## 2020-04-30 DIAGNOSIS — J9621 Acute and chronic respiratory failure with hypoxia: Secondary | ICD-10-CM | POA: Diagnosis not present

## 2020-04-30 LAB — BASIC METABOLIC PANEL
Anion gap: 9 (ref 5–15)
Anion gap: 9 (ref 5–15)
BUN: 16 mg/dL (ref 8–23)
BUN: 13 mg/dL (ref 8–23)
CO2: 43 mmol/L — ABNORMAL HIGH (ref 22–32)
CO2: 41 mmol/L — ABNORMAL HIGH (ref 22–32)
Calcium: 8.9 mg/dL (ref 8.9–10.3)
Calcium: 8.9 mg/dL (ref 8.9–10.3)
Chloride: 85 mmol/L — ABNORMAL LOW (ref 98–111)
Chloride: 89 mmol/L — ABNORMAL LOW (ref 98–111)
Creatinine, Ser: 0.5 mg/dL (ref 0.44–1.00)
Creatinine, Ser: 0.45 mg/dL (ref 0.44–1.00)
GFR calc Af Amer: >60 mL/min (ref >60)
GFR calc Af Amer: >60 mL/min (ref >60)
GFR calc non Af Amer: >60 mL/min (ref >60)
GFR calc non Af Amer: >60 mL/min (ref >60)
Glucose, Bld: 155 mg/dL — ABNORMAL HIGH (ref 70–99)
Glucose, Bld: 144 mg/dL — ABNORMAL HIGH (ref 70–99)
Potassium: 5.2 mmol/L — ABNORMAL HIGH (ref 3.5–5.1)
Potassium: 4.5 mmol/L (ref 3.5–5.1)
Sodium: 137 mmol/L (ref 135–145)
Sodium: 139 mmol/L (ref 135–145)

## 2020-04-30 LAB — CBC
HCT: 40 % (ref 36.0–46.0)
Hemoglobin: 12.2 g/dL (ref 12.0–15.0)
MCH: 28.6 pg (ref 26.0–34.0)
MCHC: 30.5 g/dL (ref 30.0–36.0)
MCV: 93.7 fL (ref 80.0–100.0)
Platelets: 55 10*3/uL — ABNORMAL LOW (ref 150–400)
RBC: 4.27 MIL/uL (ref 3.87–5.11)
RDW: 15.3 % (ref 11.5–15.5)
WBC: 9.3 10*3/uL (ref 4.0–10.5)
nRBC: 0 % (ref 0.0–0.2)

## 2020-04-30 LAB — GLUCOSE, CAPILLARY
Glucose-Capillary: 137 mg/dL — ABNORMAL HIGH (ref 70–99)
Glucose-Capillary: 218 mg/dL — ABNORMAL HIGH (ref 70–99)
Glucose-Capillary: 157 mg/dL — ABNORMAL HIGH (ref 70–99)

## 2020-04-30 LAB — SARS CORONAVIRUS 2 (TAT 6-24 HRS): SARS Coronavirus 2: NEGATIVE

## 2020-04-30 MED ORDER — SODIUM ZIRCONIUM CYCLOSILICATE 5 G PO PACK
5.0000 g | PACK | Freq: Once | ORAL | Status: AC
  Administered 2020-04-30: 5 g via ORAL
  Filled 2020-04-30: qty 1

## 2020-04-30 NOTE — Progress Notes (Signed)
Physical Therapy Treatment Patient Details Name: [name] MRN: [id number] DOB: 02-01-1951 Today's Date: 04/30/2020    History of Present Illness 69yo female brought to the ED with AMS at home, found be septic likely due to purulent cellulitis of LLE. Needed NPPV in ED at FiO2 at 50%. CXR clear. Found to be covered in stool in ED. Also found to have acute respiratory failure. PMH HTN, vertebral compression fractures, depression, CKD, CP, LBP, lumbar fusion    PT Comments    Patient received in bed, willing to work with therapy today but had been incontinent of bowel and bladder and soiled bed. Needed MaxA for rolling, also totalA for pericare; when asked to wipe her front in supine, she refuses stating "I don't want to". Able to get to EOB with modAx2 but from there able to progress mobility significantly and performed multiple functional transfers with MinAx2 and RW! Left up in the recliner with all needs met, chair alarm active and NT present and attending. Continue to recommend SNF.     Follow Up Recommendations  SNF;Supervision/Assistance - 24 hour;Other (comment) (will need HHPT and 24/7A if she is unable to go to SNF)     Equipment Recommendations  Other (comment);Hospital bed;Wheelchair (measurements PT);Rolling walker with 5" wheels;Wheelchair cushion (measurements PT);3in1 (PT)    Recommendations for Other Services       Precautions / Restrictions Precautions Precautions: Fall;Other (comment) Precaution Comments: watch sats, morbid obesity Restrictions Weight Bearing Restrictions: No    Mobility  Bed Mobility Overal bed mobility: Needs Assistance Bed Mobility: Rolling;Sidelying to Sit Rolling: Max assist Sidelying to sit: Mod assist;+2 for physical assistance       General bed mobility comments: cues for technique and sequencing, practiced log rolling to avoid exacerbating back pain  Transfers Overall transfer level: Needs assistance Equipment used: Rolling  walker (2 wheeled) Transfers: Sit to/from [facility] Sit to Stand: Min assist;+2 physical assistance Stand pivot transfers: Min assist;+2 physical assistance       General transfer comment: MinAx2 to rise to full standing position, then needed extended time and had increased effort to take pivotal steps to recliner, cues for safety and sequencing  Ambulation/Gait             General Gait Details: deferred   Stairs             Wheelchair Mobility    Modified Rankin (Stroke Patients Only)       Balance Overall balance assessment: Needs assistance Sitting-balance support: Single extremity supported;Feet supported Sitting balance-Leahy Scale: Fair Sitting balance - Comments: BUE support but did not challenge dynamically   Standing balance support: Bilateral upper extremity supported;During functional activity Standing balance-Leahy Scale: Poor Standing balance comment: reliant on BUE support                            Cognition Arousal/Alertness: Awake/alert Behavior During Therapy: Flat affect;Anxious Overall Cognitive Status: Impaired/Different from baseline                     Current Attention Level: Sustained   Following Commands: Follows one step commands consistently;Follows one step commands with increased time Safety/Judgement: Decreased awareness of safety;Decreased awareness of deficits Awareness: Emergent Problem Solving: Slow processing;Decreased initiation;Difficulty sequencing;Requires verbal cues;Requires tactile cues General Comments: anxious during mobility but less self limitng today, willing to get up to chair and able to reflect back on events of refusal on friday  Exercises      General Comments General comments (skin integrity, edema, etc.): VSS on 3LPM O2      Pertinent Vitals/Pain Pain Assessment: Faces Faces Pain Scale: Hurts a little bit Pain Location: back Pain Descriptors /  Indicators: Grimacing;Guarding;Sore Pain Intervention(s): Limited activity within patient's tolerance;Monitored during session    Home Living                      Prior Function            PT Goals (current goals can now be found in the care plan section) Acute Rehab PT Goals Patient Stated Goal: to go home PT Goal Formulation: With patient Time For Goal Achievement: 05/04/20 Potential to Achieve Goals: Fair Progress towards PT goals: Progressing toward goals    Frequency    Min 2X/week      PT Plan Current plan remains appropriate    Co-evaluation              AM-PAC PT "6 Clicks" Mobility   Outcome Measure  Help needed turning from your back to your side while in a flat bed without using bedrails?: A Lot Help needed moving from lying on your back to sitting on the side of a flat bed without using bedrails?: A Lot Help needed moving to and from a bed to a chair (including a wheelchair)?: A Lot Help needed standing up from a chair using your arms (e.g., wheelchair or bedside chair)?: A Lot Help needed to walk in hospital room?: A Lot Help needed climbing 3-5 steps with a railing? : Total 6 Click Score: 11    End of Session Equipment Utilized During Treatment: Oxygen;Gait belt Activity Tolerance: Patient limited by fatigue Patient left: in chair;with call bell/phone within reach;with chair alarm set Nurse Communication: Mobility status PT Visit Diagnosis: Unsteadiness on feet (R26.81);Difficulty in walking, not elsewhere classified (R26.2);Muscle weakness (generalized) (M62.81);Adult, failure to thrive (R62.7)     Time: 1019-1059 PT Time Calculation (min) (ACUTE ONLY): 40 min  Charges:  $Therapeutic Activity: 38-52 mins                      Windell Norfolk, DPT, PN1   Supplemental Physical Therapist [place]    Pager [phone] Acute Rehab Office [phone]

## 2020-04-30 NOTE — Progress Notes (Signed)
   04/30/20 0600  Assess: MEWS Score  Pulse Rate 69  ECG Heart Rate 70  Resp (!) 27  SpO2 98 %  Assess: MEWS Score  MEWS Temp 0  MEWS Systolic 0  MEWS Pulse 0  MEWS RR 2  MEWS LOC 0  MEWS Score 2  MEWS Score Color Yellow  Assess: if the MEWS score is Yellow or Red  Were vital signs taken at a resting state? Yes  Focused Assessment No change from prior assessment  Early Detection of Sepsis Score *See Row Information* Low  MEWS guidelines implemented *See Row Information* No, vital signs rechecked

## 2020-04-30 NOTE — Progress Notes (Signed)
   04/29/20 2352  Assess: MEWS Score  Temp 98.8 F (37.1 C)  BP (!) 85/61  Pulse Rate 85  ECG Heart Rate 85  SpO2 93 %  O2 Device Nasal Cannula  Assess: MEWS Score  MEWS Temp 0  MEWS Systolic 1  MEWS Pulse 0  MEWS RR 1  MEWS LOC 0  MEWS Score 2  MEWS Score Color Yellow  Assess: if the MEWS score is Yellow or Red  Were vital signs taken at a resting state? Yes  Focused Assessment No change from prior assessment  Early Detection of Sepsis Score *See Row Information* Low  MEWS guidelines implemented *See Row Information* No, vital signs rechecked (pt asymptomatic; will recheck VS)

## 2020-04-30 NOTE — Progress Notes (Signed)
   04/30/20 0200  Assess: MEWS Score  Pulse Rate 76  ECG Heart Rate 75  Resp (!) 26  SpO2 99 %  Assess: MEWS Score  MEWS Temp 0  MEWS Systolic 0  MEWS Pulse 0  MEWS RR 2  MEWS LOC 0  MEWS Score 2  MEWS Score Color Yellow  Assess: if the MEWS score is Yellow or Red  Were vital signs taken at a resting state? Yes  Focused Assessment No change from prior assessment  Early Detection of Sepsis Score *See Row Information* Low  MEWS guidelines implemented *See Row Information* No, vital signs rechecked (Pt is intermttently tachypneic, will recheck RR)

## 2020-04-30 NOTE — Progress Notes (Signed)
   Subjective:  Patient seen at bedside this AM. Reports she's doing well, no acute issues overnight. Breathing okay, much better than before hospitalization.  Objective:  Vital signs in last 24 hours: Vitals:   04/30/20 0200 04/30/20 0204 04/30/20 0300 04/30/20 0415  BP:    (!) 135/52  Pulse: 76 80  78  Resp: (!) 26 (!) 22  20  Temp:    97.9 F (36.6 C)  TempSrc:    Axillary  SpO2: 99% 97%  100%  Weight:   101.9 kg   Height:       Physical Exam: General: Resting in bed comfortably on [place], no acute distress CV: Regular rate rhythm, no murmurs, gallops, rubs Pulm: performed w/ pt lying down, CTA bilaterally Abdomen: Soft, non-tender, non-distended MSK: Bilateral legs wrapped with gauze, chronic skin changes on feet  Assessment/Plan:  Principal Problem:   Sepsis due to cellulitis (HCC) Active Problems:   Chronic idiopathic thrombocytopenia (HCC)   Chronic heart failure with preserved ejection fraction with moderate RV failure   Wound of lower extremity, bilateral   Obesity hypoventilation syndrome (HCC)   Chronic respiratory failure with hypoxia and hypercapnia (HCC)   Pressure injury of skin  Patient is 69yo female with diastolic heart failure, chronic respiratory failure, obesity hypoventilation syndrome, chronic ITP admitted for acute on chronic hypoxic, hypercapnic respiratory failure 2/2 sepsis d/t LE cellulitis, now resolved.  #Acute on chronic hypoxic, hypercapnic respiratory failure, resolved #Obesity hypoventilation syndrome #OSA Patient continues to do well on 3L [place], on home O2 requirement. Discussed with CSW this AM, SNF able to accept patient with BiPAP, but not Trilogy machine. Plan to d/c today w/ BiPAP settings 18/6, rate 16, FiO2 40-50% per RRT. She will need outpatient sleep study, PFT's once acute rehabilitation is complete.  -BiPAP QHS -DuoNebs -Incentive spirometry -Home po lasix 80mg  QD  #Sepsis 2/2 Le cellulitis, resolved #Chronic venous stasis  ulcers Afebrile, HDS -s/p 7d Rocephin -Appreciate WOC assistance  #Chronic ITP PLT 55,000 this AM -DVT PPX  Diet: HH, CM IVF: n/a Bowel: Senna DVT PPX: Lovenox Code Status: Full Prior to Admission Living Arrangement: homa Anticipated Discharge Location: SNF Barriers to Discharge: n/a Dispo: Anticipated discharge in approximately 0 day(s).   Sanjuan Dame, MD 04/30/2020, 5:12 AM Pager: [phone] After 5pm on weekdays and 1pm on weekends: On Call pager [phone]

## 2020-04-30 NOTE — Progress Notes (Signed)
   04/30/20 0000  Assess: MEWS Score  Pulse Rate 86  ECG Heart Rate 96  Resp (!) 26  SpO2 92 %  Assess: MEWS Score  MEWS Temp 0  MEWS Systolic 1  MEWS Pulse 0  MEWS RR 2  MEWS LOC 0  MEWS Score 3  MEWS Score Color Yellow  Assess: if the MEWS score is Yellow or Red  Were vital signs taken at a resting state? Yes  Focused Assessment No change from prior assessment  Early Detection of Sepsis Score *See Row Information* Low  MEWS guidelines implemented *See Row Information* No, vital signs rechecked

## 2020-04-30 NOTE — TOC Transition Note (Signed)
Transition of Care [facility]) - CM/SW Discharge Note   Patient Details  Name: LYNNA ZAMORANO MRN: [id number] Date of Birth: Jul 05, 1951  Transition of Care [facility]) CM/SW Contact:  Joanne Chars, LCSW Phone Number: 04/30/2020, 1:27 PM   Clinical Narrative:   CSW spoke with Dr Collene Gobble regarding potential of pt using bipap at SNF rather than trilogy and he agreed with this plan.  Spoke with Juliann Pulse at [facility] care and they can accept pt with bipap.  All settings information sent to [facility], insurance auth: ID [id number], plan Josem Kaufmann [id number], 5 days starting 8/16 with review on 8/18, fax [phone] for clinicals, reviewer Arist Dagort.  Pt in agreement with plan.  RN call report to [phone].  Pt going to room 115.     Final next level of care: Skilled Nursing Facility Barriers to Discharge: Barriers Resolved   Patient Goals and CMS Choice Patient states their goals for this hospitalization and ongoing recovery are:: get better CMS Medicare.gov Compare Post Acute Care list provided to:: Patient Choice offered to / list presented to : Patient  Discharge Placement              Patient [facility] bed at: [facility] Patient to be transferred to facility by: [place] Name of family member notified: patient Patient and family notified of of transfer: 04/30/20  Discharge Plan and Services     Post Acute Care Choice: Home Health                               Social Determinants of Health (SDOH) Interventions     Readmission Risk Interventions No flowsheet data found.

## 2020-04-30 NOTE — Plan of Care (Signed)
  Problem: Education: Goal: Knowledge of General Education information will improve Description: Including pain rating scale, medication(s)/side effects and non-pharmacologic comfort measures Outcome: Completed/Met   Problem: Health Behavior/Discharge Planning: Goal: Ability to manage health-related needs will improve Outcome: Completed/Met   Problem: Clinical Measurements: Goal: Will remain free from infection Outcome: Completed/Met Goal: Respiratory complications will improve Outcome: Completed/Met   Problem: Activity: Goal: Risk for activity intolerance will decrease Outcome: Completed/Met   Problem: Nutrition: Goal: Adequate nutrition will be maintained Outcome: Completed/Met   Problem: Coping: Goal: Level of anxiety will decrease Outcome: Completed/Met   Problem: Elimination: Goal: Will not experience complications related to bowel motility Outcome: Completed/Met Goal: Will not experience complications related to urinary retention Outcome: Completed/Met   Problem: Pain Managment: Goal: General experience of comfort will improve Outcome: Completed/Met   Problem: Safety: Goal: Ability to remain free from injury will improve Outcome: Completed/Met   Problem: Skin Integrity: Goal: Risk for impaired skin integrity will decrease Outcome: Completed/Met

## 2020-04-30 NOTE — Progress Notes (Signed)
Placed patient on CPAP via nasal mask (pt states the FFM is not tolerable) 6.0 cm H20 for patient comfort. (attempted 8.0 cm H20) with 2lpm O2 bleed in.  Patient tolerating well at this time. Patient aware to call for assistance if needed. RN aware.

## 2020-04-30 NOTE — Progress Notes (Signed)
Pt is being discharged tonight.

## 2020-05-01 DIAGNOSIS — E662 Morbid (severe) obesity with alveolar hypoventilation: Secondary | ICD-10-CM | POA: Diagnosis not present

## 2020-05-01 DIAGNOSIS — I5033 Acute on chronic diastolic (congestive) heart failure: Secondary | ICD-10-CM | POA: Diagnosis not present

## 2020-05-01 DIAGNOSIS — J9621 Acute and chronic respiratory failure with hypoxia: Secondary | ICD-10-CM | POA: Diagnosis not present

## 2020-05-01 DIAGNOSIS — D693 Immune thrombocytopenic purpura: Secondary | ICD-10-CM | POA: Diagnosis not present

## 2020-05-10 DIAGNOSIS — I5033 Acute on chronic diastolic (congestive) heart failure: Secondary | ICD-10-CM | POA: Diagnosis not present

## 2020-05-10 DIAGNOSIS — G8929 Other chronic pain: Secondary | ICD-10-CM | POA: Diagnosis not present

## 2020-05-10 DIAGNOSIS — L0390 Cellulitis, unspecified: Secondary | ICD-10-CM | POA: Diagnosis not present

## 2020-05-10 DIAGNOSIS — J9611 Chronic respiratory failure with hypoxia: Secondary | ICD-10-CM | POA: Diagnosis not present

## 2020-05-10 DIAGNOSIS — K5909 Other constipation: Secondary | ICD-10-CM | POA: Diagnosis not present

## 2020-05-10 DIAGNOSIS — D693 Immune thrombocytopenic purpura: Secondary | ICD-10-CM | POA: Diagnosis not present

## 2020-05-10 DIAGNOSIS — I10 Essential (primary) hypertension: Secondary | ICD-10-CM | POA: Diagnosis not present

## 2020-05-10 DIAGNOSIS — M6281 Muscle weakness (generalized): Secondary | ICD-10-CM | POA: Diagnosis not present

## 2020-05-10 DIAGNOSIS — M62838 Other muscle spasm: Secondary | ICD-10-CM | POA: Diagnosis not present

## 2020-05-13 ENCOUNTER — Encounter (HOSPITAL_COMMUNITY): Payer: Self-pay | Admitting: Emergency Medicine

## 2020-05-13 ENCOUNTER — Emergency Department (HOSPITAL_COMMUNITY): Payer: Medicare PPO

## 2020-05-13 ENCOUNTER — Inpatient Hospital Stay (HOSPITAL_COMMUNITY)
Admission: EM | Admit: 2020-05-13 | Discharge: 2020-05-19 | DRG: 917 | Disposition: A | Discharge status: Home Health | Payer: Medicare PPO | Attending: Internal Medicine | Admitting: Internal Medicine

## 2020-05-13 ENCOUNTER — Other Ambulatory Visit: Payer: Self-pay

## 2020-05-13 DIAGNOSIS — Z981 Arthrodesis status: Secondary | ICD-10-CM

## 2020-05-13 DIAGNOSIS — Z87442 Personal history of urinary calculi: Secondary | ICD-10-CM

## 2020-05-13 DIAGNOSIS — Z6841 Body Mass Index (BMI) 40.0 and over, adult: Secondary | ICD-10-CM | POA: Diagnosis not present

## 2020-05-13 DIAGNOSIS — L89302 Pressure ulcer of unspecified buttock, stage 2: Secondary | ICD-10-CM | POA: Diagnosis present

## 2020-05-13 DIAGNOSIS — G9389 Other specified disorders of brain: Secondary | ICD-10-CM | POA: Diagnosis not present

## 2020-05-13 DIAGNOSIS — G319 Degenerative disease of nervous system, unspecified: Secondary | ICD-10-CM | POA: Diagnosis not present

## 2020-05-13 DIAGNOSIS — I872 Venous insufficiency (chronic) (peripheral): Secondary | ICD-10-CM | POA: Diagnosis present

## 2020-05-13 DIAGNOSIS — E860 Dehydration: Secondary | ICD-10-CM | POA: Diagnosis present

## 2020-05-13 DIAGNOSIS — I959 Hypotension, unspecified: Secondary | ICD-10-CM | POA: Diagnosis not present

## 2020-05-13 DIAGNOSIS — J9611 Chronic respiratory failure with hypoxia: Secondary | ICD-10-CM | POA: Diagnosis not present

## 2020-05-13 DIAGNOSIS — R269 Unspecified abnormalities of gait and mobility: Secondary | ICD-10-CM | POA: Diagnosis not present

## 2020-05-13 DIAGNOSIS — F1190 Opioid use, unspecified, uncomplicated: Secondary | ICD-10-CM | POA: Diagnosis present

## 2020-05-13 DIAGNOSIS — R579 Shock, unspecified: Secondary | ICD-10-CM | POA: Diagnosis present

## 2020-05-13 DIAGNOSIS — N179 Acute kidney failure, unspecified: Secondary | ICD-10-CM | POA: Diagnosis present

## 2020-05-13 DIAGNOSIS — E662 Morbid (severe) obesity with alveolar hypoventilation: Secondary | ICD-10-CM | POA: Diagnosis present

## 2020-05-13 DIAGNOSIS — G8929 Other chronic pain: Secondary | ICD-10-CM | POA: Diagnosis present

## 2020-05-13 DIAGNOSIS — F329 Major depressive disorder, single episode, unspecified: Secondary | ICD-10-CM | POA: Diagnosis present

## 2020-05-13 DIAGNOSIS — I5032 Chronic diastolic (congestive) heart failure: Secondary | ICD-10-CM | POA: Diagnosis present

## 2020-05-13 DIAGNOSIS — G92 Toxic encephalopathy: Secondary | ICD-10-CM | POA: Diagnosis present

## 2020-05-13 DIAGNOSIS — Z90722 Acquired absence of ovaries, bilateral: Secondary | ICD-10-CM

## 2020-05-13 DIAGNOSIS — Z9079 Acquired absence of other genital organ(s): Secondary | ICD-10-CM | POA: Diagnosis not present

## 2020-05-13 DIAGNOSIS — D693 Immune thrombocytopenic purpura: Secondary | ICD-10-CM | POA: Diagnosis present

## 2020-05-13 DIAGNOSIS — Z8249 Family history of ischemic heart disease and other diseases of the circulatory system: Secondary | ICD-10-CM

## 2020-05-13 DIAGNOSIS — J9621 Acute and chronic respiratory failure with hypoxia: Secondary | ICD-10-CM | POA: Diagnosis present

## 2020-05-13 DIAGNOSIS — R578 Other shock: Secondary | ICD-10-CM | POA: Diagnosis present

## 2020-05-13 DIAGNOSIS — T402X1A Poisoning by other opioids, accidental (unintentional), initial encounter: Principal | ICD-10-CM | POA: Diagnosis present

## 2020-05-13 DIAGNOSIS — G9340 Encephalopathy, unspecified: Secondary | ICD-10-CM | POA: Diagnosis not present

## 2020-05-13 DIAGNOSIS — J9612 Chronic respiratory failure with hypercapnia: Secondary | ICD-10-CM | POA: Diagnosis not present

## 2020-05-13 DIAGNOSIS — Z87891 Personal history of nicotine dependence: Secondary | ICD-10-CM | POA: Diagnosis not present

## 2020-05-13 DIAGNOSIS — L8990 Pressure ulcer of unspecified site, unspecified stage: Secondary | ICD-10-CM | POA: Diagnosis present

## 2020-05-13 DIAGNOSIS — Z20822 Contact with and (suspected) exposure to covid-19: Secondary | ICD-10-CM | POA: Diagnosis present

## 2020-05-13 DIAGNOSIS — Z9119 Patient's noncompliance with other medical treatment and regimen: Secondary | ICD-10-CM

## 2020-05-13 DIAGNOSIS — R0902 Hypoxemia: Secondary | ICD-10-CM | POA: Diagnosis not present

## 2020-05-13 DIAGNOSIS — I10 Essential (primary) hypertension: Secondary | ICD-10-CM | POA: Diagnosis not present

## 2020-05-13 DIAGNOSIS — R001 Bradycardia, unspecified: Secondary | ICD-10-CM | POA: Diagnosis present

## 2020-05-13 DIAGNOSIS — J9610 Chronic respiratory failure, unspecified whether with hypoxia or hypercapnia: Secondary | ICD-10-CM | POA: Diagnosis not present

## 2020-05-13 DIAGNOSIS — I110 Hypertensive heart disease with heart failure: Secondary | ICD-10-CM | POA: Diagnosis present

## 2020-05-13 DIAGNOSIS — J9622 Acute and chronic respiratory failure with hypercapnia: Secondary | ICD-10-CM | POA: Diagnosis present

## 2020-05-13 DIAGNOSIS — T391X5A Adverse effect of 4-Aminophenol derivatives, initial encounter: Secondary | ICD-10-CM | POA: Diagnosis not present

## 2020-05-13 DIAGNOSIS — R5381 Other malaise: Secondary | ICD-10-CM | POA: Diagnosis not present

## 2020-05-13 DIAGNOSIS — I878 Other specified disorders of veins: Secondary | ICD-10-CM | POA: Diagnosis not present

## 2020-05-13 DIAGNOSIS — Z90710 Acquired absence of both cervix and uterus: Secondary | ICD-10-CM

## 2020-05-13 DIAGNOSIS — M810 Age-related osteoporosis without current pathological fracture: Secondary | ICD-10-CM | POA: Diagnosis present

## 2020-05-13 DIAGNOSIS — J9811 Atelectasis: Secondary | ICD-10-CM | POA: Diagnosis not present

## 2020-05-13 DIAGNOSIS — I5033 Acute on chronic diastolic (congestive) heart failure: Secondary | ICD-10-CM | POA: Diagnosis not present

## 2020-05-13 DIAGNOSIS — K219 Gastro-esophageal reflux disease without esophagitis: Secondary | ICD-10-CM | POA: Diagnosis present

## 2020-05-13 DIAGNOSIS — R4182 Altered mental status, unspecified: Secondary | ICD-10-CM

## 2020-05-13 DIAGNOSIS — M549 Dorsalgia, unspecified: Secondary | ICD-10-CM | POA: Diagnosis not present

## 2020-05-13 DIAGNOSIS — R0689 Other abnormalities of breathing: Secondary | ICD-10-CM | POA: Diagnosis not present

## 2020-05-13 DIAGNOSIS — R404 Transient alteration of awareness: Secondary | ICD-10-CM | POA: Diagnosis not present

## 2020-05-13 DIAGNOSIS — Z7401 Bed confinement status: Secondary | ICD-10-CM | POA: Diagnosis not present

## 2020-05-13 DIAGNOSIS — T428X5A Adverse effect of antiparkinsonism drugs and other central muscle-tone depressants, initial encounter: Secondary | ICD-10-CM | POA: Diagnosis not present

## 2020-05-13 DIAGNOSIS — M25569 Pain in unspecified knee: Secondary | ICD-10-CM | POA: Diagnosis not present

## 2020-05-13 DIAGNOSIS — A419 Sepsis, unspecified organism: Secondary | ICD-10-CM | POA: Diagnosis not present

## 2020-05-13 DIAGNOSIS — R55 Syncope and collapse: Secondary | ICD-10-CM | POA: Diagnosis not present

## 2020-05-13 DIAGNOSIS — Z79891 Long term (current) use of opiate analgesic: Secondary | ICD-10-CM

## 2020-05-13 DIAGNOSIS — M2550 Pain in unspecified joint: Secondary | ICD-10-CM | POA: Diagnosis not present

## 2020-05-13 DIAGNOSIS — Z79899 Other long term (current) drug therapy: Secondary | ICD-10-CM

## 2020-05-13 LAB — I-STAT VENOUS BLOOD GAS, ED
Acid-Base Excess: 6 mmol/L — ABNORMAL HIGH (ref 0.0–2.0)
Bicarbonate: 33.6 mmol/L — ABNORMAL HIGH (ref 20.0–28.0)
Calcium, Ion: 1.02 mmol/L — ABNORMAL LOW (ref 1.15–1.40)
HCT: 41 % (ref 36.0–46.0)
Hemoglobin: 13.9 g/dL (ref 12.0–15.0)
O2 Saturation: 47 %
Potassium: 4.2 mmol/L (ref 3.5–5.1)
Sodium: 132 mmol/L — ABNORMAL LOW (ref 135–145)
TCO2: 35 mmol/L — ABNORMAL HIGH (ref 22–32)
pCO2, Ven: 59.3 mmHg (ref 44.0–60.0)
pH, Ven: 7.361 (ref 7.250–7.430)
pO2, Ven: 27 mmHg — CL (ref 32.0–45.0)

## 2020-05-13 LAB — LACTIC ACID, PLASMA: Lactic Acid, Venous: 1.2 mmol/L (ref 0.5–1.9)

## 2020-05-13 LAB — PROTIME-INR
INR: 1.1 (ref 0.8–1.2)
Prothrombin Time: 13.3 seconds (ref 11.4–15.2)

## 2020-05-13 LAB — COMPREHENSIVE METABOLIC PANEL
ALT: 26 U/L (ref 0–44)
AST: 16 U/L (ref 15–41)
Albumin: 3.4 g/dL — ABNORMAL LOW (ref 3.5–5.0)
Alkaline Phosphatase: 79 U/L (ref 38–126)
Anion gap: 12 (ref 5–15)
BUN: 36 mg/dL — ABNORMAL HIGH (ref 8–23)
CO2: 27 mmol/L (ref 22–32)
Calcium: 7.8 mg/dL — ABNORMAL LOW (ref 8.9–10.3)
Chloride: 94 mmol/L — ABNORMAL LOW (ref 98–111)
Creatinine, Ser: 2.47 mg/dL — ABNORMAL HIGH (ref 0.44–1.00)
GFR calc Af Amer: 23 mL/min — ABNORMAL LOW (ref >60)
GFR calc non Af Amer: 20 mL/min — ABNORMAL LOW (ref >60)
Glucose, Bld: 120 mg/dL — ABNORMAL HIGH (ref 70–99)
Potassium: 4 mmol/L (ref 3.5–5.1)
Sodium: 133 mmol/L — ABNORMAL LOW (ref 135–145)
Total Bilirubin: 1.3 mg/dL — ABNORMAL HIGH (ref 0.3–1.2)
Total Protein: 5.4 g/dL — ABNORMAL LOW (ref 6.5–8.1)

## 2020-05-13 LAB — CBG MONITORING, ED: Glucose-Capillary: 140 mg/dL — ABNORMAL HIGH (ref 70–99)

## 2020-05-13 LAB — APTT: aPTT: 24 seconds (ref 24–36)

## 2020-05-13 LAB — TROPONIN I (HIGH SENSITIVITY): Troponin I (High Sensitivity): 16 ng/L (ref <18)

## 2020-05-13 MED ORDER — SODIUM CHLORIDE 0.9 % IV BOLUS
1000.0000 mL | Freq: Once | INTRAVENOUS | Status: AC
  Administered 2020-05-13: 1000 mL via INTRAVENOUS

## 2020-05-13 MED ORDER — SODIUM CHLORIDE 0.9 % IV SOLN
2.0000 g | Freq: Once | INTRAVENOUS | Status: AC
  Administered 2020-05-13: 2 g via INTRAVENOUS
  Filled 2020-05-13: qty 20

## 2020-05-13 NOTE — ED Triage Notes (Signed)
Pt BIB [facility] from home, family reports pt d/c from rehab facility yesterday and has been having multiple syncopal events since. Pt hypotensive with EMS, BP 70/54, given fluids.

## 2020-05-13 NOTE — ED Notes (Signed)
Unable to obtain second set up blood cultures. Starting IV antibiotics at this time

## 2020-05-13 NOTE — ED Provider Notes (Signed)
[facility] Emergency Department Provider Note MRN:  [id number]  Arrival date & time: 05/13/20     Chief Complaint   Loss of Consciousness   History of Present Illness   [name] is a 69 y.o. year-old female with a history of obesity, CKD presenting to the ED with chief complaint of loss of consciousness.  Multiple episodes of syncope at rehab facility.  Hypotensive with EMS.  Patient is altered.  I was unable to obtain an accurate HPI, PMH, or ROS due to the patient's altered mental status.  Level 5 caveat.  Review of Systems  Positive for multiple syncopal episodes, hypotension, altered mental status.  Patient's Health History    Past Medical History:  Diagnosis Date  . Arthritis    lumbar burst fx, osteoporosis  . Back pain 09/29/2013  . Blood dyscrasia   . Chest pain    denies  . Chronic kidney disease    passed stones spontaneously- 40 yrs. ago   . Depression    states her current situation with activity limitations, she has a low spirit at times.   Marland Kitchen Dysrhythmia    occ tachycardia  . Fracture of vertebra, compression ([place])   . GERD (gastroesophageal reflux disease)    occ  . History of kidney stones   . HTN (hypertension)   . ITP (idiopathic thrombocytopenic purpura) 08/29/2013  . Leukocytosis, unspecified 09/05/2013  . Muscle cramp 11/16/2013  . Tachycardia    occ upon exersion, and when put on bp med    Past Surgical History:  Procedure Laterality Date  . ABDOMINAL HYSTERECTOMY    . ANTERIOR LAT LUMBAR FUSION N/A 03/28/2014   Procedure: Anterolateral decompression of L1 fracture with posterior segmental stabilization;  Surgeon: Kristeen Miss, MD;  Location: [facility];  Service: Neurosurgery;  Laterality: N/A;  Anterolateral decompression of Lumbar One fracture with posterior segmental stabilization  . CESAREAN SECTION     x2  . CHEST TUBE INSERTION Left 03/28/2014   Procedure: CHEST TUBE INSERTION;  Surgeon: Ivin Poot,  MD;  Location: [facility];  Service: Thoracic;  Laterality: Left;  . INCISIONAL HERNIA REPAIR N/A 11/13/2014   Procedure: PRIMARY REPAIR INCARCERATED INCISIONAL HERNIA WITH PLACEMENT BIOLOGIC MESH;  Surgeon: Rolm Bookbinder, MD;  Location: [facility];  Service: General;  Laterality: N/A;  . LAPAROTOMY Right 09   ovarian cyst  . TOTAL ABDOMINAL HYSTERECTOMY W/ BILATERAL SALPINGOOPHORECTOMY  90    Family History  Problem Relation Age of Onset  . Hypertension Other   . Heart attack Other   . Leukemia Paternal Uncle        leukemia  . Heart attack Mother   . Hypertension Mother   . Heart disease Father     Social History   Socioeconomic History  . Marital status: Divorced    Spouse name: Not on file  . Number of children: Not on file  . Years of education: Not on file  . Highest education level: Not on file  Occupational History  . Occupation: Retired  . Occupation: Disabled  Tobacco Use  . Smoking status: Former Smoker    Packs/day: 1.00    Years: 15.00    Pack years: 15.00    Types: Cigarettes    Quit date: 09/15/2014    Years since quitting: 5.6  . Smokeless tobacco: Never Used  Vaping Use  . Vaping Use: Never used  Substance and Sexual Activity  . Alcohol use: Yes    Comment: rare  .  Drug use: No  . Sexual activity: Not on file  Other Topics Concern  . Not on file  Social History Narrative   Current Social History 01/10/2020        Patient lives with daughter and daughter's fiance in a one level home. There are 3 steps with handrail up to the entrance the patient uses.       Patient's method of transportation is personal car. Patient no longer drives. Family member drives.      The highest level of education was high school diploma.      The patient currently retired and disabled.      Identified important Relationships are "My children and grandchildren (4 with one on the way)       Pets : 1 dog, 1 cat, and 2 birds.       Interests / Fun: Watch TV        Current Stressors: "Nothing really stresses me"       Religious / Personal Beliefs: "I believe in God and Jesus and know I'm going to heaven." ([place])       L. [place], BSN, RN-BC       Social Determinants of Health   Financial Resource Strain:   . Difficulty of Paying Living Expenses: Not on file  Food Insecurity:   . Worried About Charity fundraiser in the Last Year: Not on file  . Ran Out of Food in the Last Year: Not on file  Transportation Needs:   . Lack of Transportation (Medical): Not on file  . Lack of Transportation (Non-Medical): Not on file  Physical Activity:   . Days of Exercise per Week: Not on file  . Minutes of Exercise per Session: Not on file  Stress:   . Feeling of Stress : Not on file  Social Connections:   . Frequency of Communication with Friends and Family: Not on file  . Frequency of Social Gatherings with Friends and Family: Not on file  . Attends Religious Services: Not on file  . Active Member of Clubs or Organizations: Not on file  . Attends Archivist Meetings: Not on file  . Marital Status: Not on file  Intimate Partner Violence:   . Fear of Current or Ex-Partner: Not on file  . Emotionally Abused: Not on file  . Physically Abused: Not on file  . Sexually Abused: Not on file     Physical Exam   Vitals:   05/13/20 2245 05/13/20 2322  BP: (!) 91/51 110/81  Pulse: 66 96  Resp: 16 (!) 22  SpO2: 98% 94%    CONSTITUTIONAL: Ill-appearing, obese, sonorous NEURO: Minimal response to painful stimulus EYES:  eyes equal and reactive ENT/NECK:  no LAD, no JVD CARDIO: Bradycardic rate, well-perfused, normal S1 and S2 PULM:  CTAB no wheezing or rhonchi GI/GU:  normal bowel sounds, non-distended, non-tender MSK/SPINE:  No gross deformities, no edema SKIN: Chronic erythematous skin changes to bilateral lower extremities PSYCH: Unable to assess  *Additional and/or pertinent findings included in MDM below  Diagnostic and  Interventional Summary    EKG Interpretation  Date/Time:  Sunday May 13 2020 21:53:35 EDT Ventricular Rate:  68 PR Interval:    QRS Duration: 96 QT Interval:  423 QTC Calculation: 450 R Axis:   17 Text Interpretation: Sinus rhythm Probable left atrial enlargement ST elevation, consider inferior injury Confirmed by Gerlene Fee [id number]) on 05/13/2020 10:44:23 PM      Labs Reviewed  COMPREHENSIVE  METABOLIC PANEL - Abnormal; Notable for the following components:      Result Value   Sodium 133 (*)    Chloride 94 (*)    Glucose, Bld 120 (*)    BUN 36 (*)    Creatinine, Ser 2.47 (*)    Calcium 7.8 (*)    Total Protein 5.4 (*)    Albumin 3.4 (*)    Total Bilirubin 1.3 (*)    GFR calc non Af Amer 20 (*)    GFR calc Af Amer 23 (*)    All other components within normal limits  CBC WITH DIFFERENTIAL/PLATELET - Abnormal; Notable for the following components:   WBC 12.5 (*)    RDW 16.4 (*)    Platelets 39 (*)    Neutro Abs 10.3 (*)    Abs Immature Granulocytes 0.10 (*)    All other components within normal limits  CBG MONITORING, ED - Abnormal; Notable for the following components:   Glucose-Capillary 140 (*)    All other components within normal limits  I-STAT VENOUS BLOOD GAS, ED - Abnormal; Notable for the following components:   pO2, Ven 27.0 (*)    Bicarbonate 33.6 (*)    TCO2 35 (*)    Acid-Base Excess 6.0 (*)    Sodium 132 (*)    Calcium, Ion 1.02 (*)    All other components within normal limits  URINE CULTURE  CULTURE, BLOOD (ROUTINE X 2)  CULTURE, BLOOD (ROUTINE X 2)  LACTIC ACID, PLASMA  PROTIME-INR  APTT  LACTIC ACID, PLASMA  URINALYSIS, ROUTINE W REFLEX MICROSCOPIC  TROPONIN I (HIGH SENSITIVITY)  TROPONIN I (HIGH SENSITIVITY)    CT Head Wo Contrast  Final Result    DG Chest Port 1 View  Final Result      Medications  cefTRIAXone (ROCEPHIN) 2 g in sodium chloride 0.9 % 100 mL IVPB (0 g Intravenous Stopped 05/13/20 2319)  sodium chloride 0.9 % bolus  1,000 mL (1,000 mLs Intravenous New Bag/Given 05/13/20 2205)  sodium chloride 0.9 % bolus 1,000 mL (1,000 mLs Intravenous New Bag/Given 05/13/20 2132)     Procedures  /  Critical Care .Critical Care Performed by: Maudie Flakes, MD Authorized by: Maudie Flakes, MD   Critical care provider statement:    Critical care time (minutes):  45   Critical care was time spent personally by me on the following activities:  Discussions with consultants, evaluation of patient's response to treatment, examination of patient, ordering and performing treatments and interventions, ordering and review of laboratory studies, ordering and review of radiographic studies, pulse oximetry, re-evaluation of patient's condition, obtaining history from patient or surrogate and review of old charts    ED Course and Medical Decision Making  I have reviewed the triage vital signs, the nursing notes, and pertinent available records from the EMR.  Listed above are laboratory and imaging tests that I personally ordered, reviewed, and interpreted and then considered in my medical decision making (see below for details).  Concern for sepsis or CNS lesion or bleeding or metabolic disarray or dehydration.  Providing empiric IV fluids, antibiotics, work-up pending.     Work-up reveals acute kidney injury, patient continues to be altered.  Blood pressure improving with IV fluids.  CT head is without obvious bleeding or masses or midline shift, admitted to internal medicine service for further care.  Barth Kirks. Sedonia Small, [place] [email or web address]  Final Clinical Impressions(s) / ED Diagnoses  ICD-10-CM   1. AKI (acute kidney injury) ([place])  N17.9   2. Altered mental status, unspecified altered mental status type  R41.82     ED Discharge Orders    None       Discharge Instructions Discussed with and Provided to Patient:   Discharge Instructions   None        Maudie Flakes, MD 05/13/20 2358

## 2020-05-14 DIAGNOSIS — J9622 Acute and chronic respiratory failure with hypercapnia: Secondary | ICD-10-CM | POA: Diagnosis present

## 2020-05-14 DIAGNOSIS — J9621 Acute and chronic respiratory failure with hypoxia: Secondary | ICD-10-CM | POA: Diagnosis present

## 2020-05-14 DIAGNOSIS — Z9079 Acquired absence of other genital organ(s): Secondary | ICD-10-CM | POA: Diagnosis not present

## 2020-05-14 DIAGNOSIS — J9611 Chronic respiratory failure with hypoxia: Secondary | ICD-10-CM | POA: Diagnosis not present

## 2020-05-14 DIAGNOSIS — Z87891 Personal history of nicotine dependence: Secondary | ICD-10-CM

## 2020-05-14 DIAGNOSIS — T391X5A Adverse effect of 4-Aminophenol derivatives, initial encounter: Secondary | ICD-10-CM | POA: Diagnosis not present

## 2020-05-14 DIAGNOSIS — M810 Age-related osteoporosis without current pathological fracture: Secondary | ICD-10-CM | POA: Diagnosis present

## 2020-05-14 DIAGNOSIS — G8929 Other chronic pain: Secondary | ICD-10-CM | POA: Diagnosis present

## 2020-05-14 DIAGNOSIS — R579 Shock, unspecified: Secondary | ICD-10-CM | POA: Diagnosis present

## 2020-05-14 DIAGNOSIS — I10 Essential (primary) hypertension: Secondary | ICD-10-CM

## 2020-05-14 DIAGNOSIS — N179 Acute kidney failure, unspecified: Secondary | ICD-10-CM | POA: Diagnosis present

## 2020-05-14 DIAGNOSIS — E662 Morbid (severe) obesity with alveolar hypoventilation: Secondary | ICD-10-CM

## 2020-05-14 DIAGNOSIS — T428X5A Adverse effect of antiparkinsonism drugs and other central muscle-tone depressants, initial encounter: Secondary | ICD-10-CM | POA: Diagnosis not present

## 2020-05-14 DIAGNOSIS — Z20822 Contact with and (suspected) exposure to covid-19: Secondary | ICD-10-CM | POA: Diagnosis present

## 2020-05-14 DIAGNOSIS — D693 Immune thrombocytopenic purpura: Secondary | ICD-10-CM | POA: Diagnosis present

## 2020-05-14 DIAGNOSIS — R001 Bradycardia, unspecified: Secondary | ICD-10-CM | POA: Diagnosis present

## 2020-05-14 DIAGNOSIS — L89302 Pressure ulcer of unspecified buttock, stage 2: Secondary | ICD-10-CM | POA: Diagnosis present

## 2020-05-14 DIAGNOSIS — T402X1A Poisoning by other opioids, accidental (unintentional), initial encounter: Secondary | ICD-10-CM | POA: Diagnosis present

## 2020-05-14 DIAGNOSIS — G92 Toxic encephalopathy: Secondary | ICD-10-CM

## 2020-05-14 DIAGNOSIS — I110 Hypertensive heart disease with heart failure: Secondary | ICD-10-CM | POA: Diagnosis present

## 2020-05-14 DIAGNOSIS — E860 Dehydration: Secondary | ICD-10-CM | POA: Diagnosis present

## 2020-05-14 DIAGNOSIS — K219 Gastro-esophageal reflux disease without esophagitis: Secondary | ICD-10-CM | POA: Diagnosis present

## 2020-05-14 DIAGNOSIS — I5032 Chronic diastolic (congestive) heart failure: Secondary | ICD-10-CM | POA: Diagnosis present

## 2020-05-14 DIAGNOSIS — J9612 Chronic respiratory failure with hypercapnia: Secondary | ICD-10-CM | POA: Diagnosis not present

## 2020-05-14 DIAGNOSIS — I959 Hypotension, unspecified: Secondary | ICD-10-CM | POA: Diagnosis not present

## 2020-05-14 DIAGNOSIS — Z87442 Personal history of urinary calculi: Secondary | ICD-10-CM | POA: Diagnosis not present

## 2020-05-14 DIAGNOSIS — I872 Venous insufficiency (chronic) (peripheral): Secondary | ICD-10-CM

## 2020-05-14 DIAGNOSIS — Z6841 Body Mass Index (BMI) 40.0 and over, adult: Secondary | ICD-10-CM | POA: Diagnosis not present

## 2020-05-14 DIAGNOSIS — R578 Other shock: Secondary | ICD-10-CM | POA: Diagnosis present

## 2020-05-14 DIAGNOSIS — F329 Major depressive disorder, single episode, unspecified: Secondary | ICD-10-CM | POA: Diagnosis present

## 2020-05-14 DIAGNOSIS — Z981 Arthrodesis status: Secondary | ICD-10-CM | POA: Diagnosis not present

## 2020-05-14 DIAGNOSIS — Z90710 Acquired absence of both cervix and uterus: Secondary | ICD-10-CM | POA: Diagnosis not present

## 2020-05-14 DIAGNOSIS — F1190 Opioid use, unspecified, uncomplicated: Secondary | ICD-10-CM | POA: Diagnosis present

## 2020-05-14 LAB — I-STAT ARTERIAL BLOOD GAS, ED
Acid-Base Excess: 5 mmol/L — ABNORMAL HIGH (ref 0.0–2.0)
Bicarbonate: 31.8 mmol/L — ABNORMAL HIGH (ref 20.0–28.0)
Calcium, Ion: 1.16 mmol/L (ref 1.15–1.40)
HCT: 37 % (ref 36.0–46.0)
Hemoglobin: 12.6 g/dL (ref 12.0–15.0)
O2 Saturation: 84 %
Patient temperature: 100
Potassium: 4.1 mmol/L (ref 3.5–5.1)
Sodium: 133 mmol/L — ABNORMAL LOW (ref 135–145)
TCO2: 34 mmol/L — ABNORMAL HIGH (ref 22–32)
pCO2 arterial: 57.5 mmHg — ABNORMAL HIGH (ref 32.0–48.0)
pH, Arterial: 7.355 (ref 7.350–7.450)
pO2, Arterial: 55 mmHg — ABNORMAL LOW (ref 83.0–108.0)

## 2020-05-14 LAB — CBC WITH DIFFERENTIAL/PLATELET
Abs Immature Granulocytes: 0.1 10*3/uL — ABNORMAL HIGH (ref 0.00–0.07)
Basophils Absolute: 0 10*3/uL (ref 0.0–0.1)
Basophils Relative: 0 %
Eosinophils Absolute: 0.1 10*3/uL (ref 0.0–0.5)
Eosinophils Relative: 1 %
HCT: 38.5 % (ref 36.0–46.0)
Hemoglobin: 12 g/dL (ref 12.0–15.0)
Immature Granulocytes: 1 %
Lymphocytes Relative: 10 %
Lymphs Abs: 1.2 10*3/uL (ref 0.7–4.0)
MCH: 28.4 pg (ref 26.0–34.0)
MCHC: 31.2 g/dL (ref 30.0–36.0)
MCV: 91.2 fL (ref 80.0–100.0)
Monocytes Absolute: 0.8 10*3/uL (ref 0.1–1.0)
Monocytes Relative: 7 %
Neutro Abs: 10.3 10*3/uL — ABNORMAL HIGH (ref 1.7–7.7)
Neutrophils Relative %: 81 %
Platelets: 39 10*3/uL — ABNORMAL LOW (ref 150–400)
RBC: 4.22 MIL/uL (ref 3.87–5.11)
RDW: 16.4 % — ABNORMAL HIGH (ref 11.5–15.5)
WBC: 12.5 10*3/uL — ABNORMAL HIGH (ref 4.0–10.5)
nRBC: 0 % (ref 0.0–0.2)

## 2020-05-14 LAB — SARS CORONAVIRUS 2 BY RT PCR (HOSPITAL ORDER, PERFORMED IN ~~LOC~~ HOSPITAL LAB): SARS Coronavirus 2: NEGATIVE

## 2020-05-14 LAB — CBC
HCT: 37.2 % (ref 36.0–46.0)
Hemoglobin: 12 g/dL (ref 12.0–15.0)
MCH: 29.3 pg (ref 26.0–34.0)
MCHC: 32.3 g/dL (ref 30.0–36.0)
MCV: 91 fL (ref 80.0–100.0)
Platelets: 37 10*3/uL — ABNORMAL LOW (ref 150–400)
RBC: 4.09 MIL/uL (ref 3.87–5.11)
RDW: 16.5 % — ABNORMAL HIGH (ref 11.5–15.5)
WBC: 11 10*3/uL — ABNORMAL HIGH (ref 4.0–10.5)
nRBC: 0 % (ref 0.0–0.2)

## 2020-05-14 LAB — COMPREHENSIVE METABOLIC PANEL
ALT: 24 U/L (ref 0–44)
AST: 16 U/L (ref 15–41)
Albumin: 3.3 g/dL — ABNORMAL LOW (ref 3.5–5.0)
Alkaline Phosphatase: 76 U/L (ref 38–126)
Anion gap: 9 (ref 5–15)
BUN: 35 mg/dL — ABNORMAL HIGH (ref 8–23)
CO2: 34 mmol/L — ABNORMAL HIGH (ref 22–32)
Calcium: 8.5 mg/dL — ABNORMAL LOW (ref 8.9–10.3)
Chloride: 92 mmol/L — ABNORMAL LOW (ref 98–111)
Creatinine, Ser: 1.87 mg/dL — ABNORMAL HIGH (ref 0.44–1.00)
GFR calc Af Amer: 32 mL/min — ABNORMAL LOW (ref >60)
GFR calc non Af Amer: 27 mL/min — ABNORMAL LOW (ref >60)
Glucose, Bld: 96 mg/dL (ref 70–99)
Potassium: 3.9 mmol/L (ref 3.5–5.1)
Sodium: 135 mmol/L (ref 135–145)
Total Bilirubin: 0.5 mg/dL (ref 0.3–1.2)
Total Protein: 5.4 g/dL — ABNORMAL LOW (ref 6.5–8.1)

## 2020-05-14 LAB — GLUCOSE, CAPILLARY
Glucose-Capillary: 72 mg/dL (ref 70–99)
Glucose-Capillary: 70 mg/dL (ref 70–99)

## 2020-05-14 LAB — MRSA PCR SCREENING: MRSA by PCR: NEGATIVE

## 2020-05-14 LAB — LACTIC ACID, PLASMA: Lactic Acid, Venous: 1.2 mmol/L (ref 0.5–1.9)

## 2020-05-14 LAB — TROPONIN I (HIGH SENSITIVITY): Troponin I (High Sensitivity): 14 ng/L (ref <18)

## 2020-05-14 MED ORDER — ACETAMINOPHEN 160 MG/5ML PO SOLN
650.0000 mg | Freq: Four times a day (QID) | ORAL | Status: DC | PRN
  Administered 2020-05-14 – 2020-05-19 (×14): 650 mg via ORAL
  Filled 2020-05-14 (×14): qty 20.3

## 2020-05-14 MED ORDER — NALOXONE HCL 2 MG/2ML IJ SOSY
PREFILLED_SYRINGE | INTRAMUSCULAR | Status: AC
  Filled 2020-05-14: qty 2

## 2020-05-14 MED ORDER — ACETAMINOPHEN 325 MG PO TABS
650.0000 mg | ORAL_TABLET | Freq: Four times a day (QID) | ORAL | Status: DC | PRN
  Filled 2020-05-14: qty 2

## 2020-05-14 MED ORDER — LACTATED RINGERS IV SOLN: INTRAVENOUS | Status: DC

## 2020-05-14 MED ORDER — ACETAMINOPHEN 650 MG RE SUPP: 650.0000 mg | Freq: Four times a day (QID) | RECTAL | Status: DC | PRN

## 2020-05-14 MED ORDER — NALOXONE HCL 0.4 MG/ML IJ SOLN
0.4000 mg | Freq: Once | INTRAMUSCULAR | Status: AC
  Administered 2020-05-14: 0.4 mg via INTRAVENOUS

## 2020-05-14 MED ORDER — NALOXONE HCL 0.4 MG/ML IJ SOLN
INTRAMUSCULAR | Status: AC
  Administered 2020-05-14: 0.4 mg via INTRAVENOUS
  Filled 2020-05-14: qty 1

## 2020-05-14 MED ORDER — HEPARIN SODIUM (PORCINE) 5000 UNIT/ML IJ SOLN
5000.0000 [IU] | Freq: Three times a day (TID) | INTRAMUSCULAR | Status: DC
  Administered 2020-05-14: 5000 [IU] via SUBCUTANEOUS
  Filled 2020-05-14: qty 1

## 2020-05-14 MED ORDER — LACTATED RINGERS IV BOLUS
1000.0000 mL | Freq: Once | INTRAVENOUS | Status: AC
  Administered 2020-05-14: 1000 mL via INTRAVENOUS

## 2020-05-14 NOTE — NC FL2 (Signed)
[place] MEDICAID FL2 LEVEL OF CARE SCREENING TOOL     IDENTIFICATION  Patient Name: [name] Birthdate: June 22, 1951 Sex: female Admission Date (Current Location): 05/13/2020  [facility] and [place] Number:  Herbalist and Address:  [facility] [place] [place] [address] [place], [place] [address]      Provider Number: [id number]  Attending Physician Name and Address:  Aldine Contes, MD  Relative Name and Phone Number:  Evette Doffing, [phone]    Current Level of Care: Hospital Recommended Level of Care: [place] Prior Approval Number:    Date Approved/Denied:   PASRR Number: [id number]  Discharge Plan: SNF    Current Diagnoses: Patient Active Problem List   Diagnosis Date Noted  . Shock ([place]) 05/14/2020  . Chronic, continuous use of opioids 05/14/2020  . Opioid overdose ([place]) 05/14/2020  . Acute on chronic respiratory failure with hypoxia and hypercapnia ([place]) 05/14/2020  . Pressure injury of skin 04/22/2020  . Sepsis due to cellulitis ([place]) 04/18/2020  . Venous stasis dermatitis of both lower extremities 02/25/2019  . Encounter for preventive care 10/20/2018  . Chronic respiratory failure with hypoxia and hypercapnia ([place]) 10/06/2018  . Polycythemia, secondary 10/05/2018  . Obesity hypoventilation syndrome ([place]) 10/04/2018  . AKI (acute kidney injury) ([place])   . BMI 40.0-44.9, adult ([place])   . Bilateral lower extremity edema   . Venous stasis dermatitis 09/30/2018  . Wound of lower extremity, bilateral 09/30/2018  . Chronic heart failure with preserved ejection fraction with moderate RV failure 06/09/2017  . GERD (gastroesophageal reflux disease) 11/13/2014  . Opiate-induced constipation 11/13/2014  . History of tobacco abuse 03/10/2014  . Chronic back pain secondary to traumatic L1 fracture 09/29/2013  . Chronic idiopathic thrombocytopenia ([place]) 08/29/2013  . HTN (hypertension)     Orientation  RESPIRATION BLADDER Height & Weight     Self, Place  O2 (Nasal cannula 4L; Needs Bipap 16/6 IPAP/EPAP) Incontinent, External catheter Weight: 223 lb 8.7 oz (101.4 kg) Height:  5' (152.4 cm)  BEHAVIORAL SYMPTOMS/MOOD NEUROLOGICAL BOWEL NUTRITION STATUS      Incontinent Diet (See DC Summary)  AMBULATORY STATUS COMMUNICATION OF NEEDS Skin   Extensive Assist Verbally PU Stage and Appropriate Care (Stage II on buttocks, sacrum, thigh,)                       Personal Care Assistance Level of Assistance  Bathing, Feeding, Dressing Bathing Assistance: Maximum assistance Feeding assistance: Limited assistance Dressing Assistance: Maximum assistance     Functional Limitations Info  Sight Sight Info: Impaired        SPECIAL CARE FACTORS FREQUENCY  PT (By licensed PT), OT (By licensed OT)     PT Frequency: 5x/week OT Frequency: 5x/week            Contractures Contractures Info: Not present    Additional Factors Info  Code Status, Allergies, Insulin Sliding Scale Code Status Info: Full Allergies Info: NKA   Insulin Sliding Scale Info: See dc summary       Current Medications (05/14/2020):  This is the current hospital active medication list Current Facility-Administered Medications  Medication Dose Route Frequency Provider Last Rate Last Admin  . acetaminophen (TYLENOL) tablet 650 mg  650 mg Oral Q6H PRN Christian, Rylee, MD       Or  . acetaminophen (TYLENOL) suppository 650 mg  650 mg Rectal Q6H PRN Mitzi Hansen, MD         Discharge Medications: Please  see discharge summary for a list of discharge medications.  Relevant Imaging Results:  Relevant Lab Results:   Additional Information SSN: [id number]. Needs Bipap ordered at snf  Benard Halsted, LCSW

## 2020-05-14 NOTE — Hospital Course (Addendum)
Shock. End organ damage with AKI, hypotension. Suspect hypovolemic vs sepsis Lactate normal. Elevated white count with left shift.  No significant findings to suggest cardiogenic or hemmorhagic shock.  Plan --f/u procalc --f/u repeat lactate --follow blood/urine cultures --f/u UA --LR bolus  2. AKI. Likely pre-renal. Fluid repletion.  3. Acute on chronic respiratory failure 2/2 OHS.   4. Acute toxic encephalopathy. Non-focal exam findings and negative head CT so less likely CVA. Large number of centrally acting medications on home meds. Pinpoint pupils on exam.  --f/u ABG --hold centrally acting meds  5. Chronic ITP. Platelets 39 on admission. Around baseline.       Reports that her back is still hurting a lot. Reports she has trouble controlling her pain due to her blood pressure being so low. Reports not using her BiPAP at night due to her oxygen staying up. She reports not wanting to wear it. Discussed that main concern is the Co2 build up. She states the mask hurts her face and we discussed attempting to try other masks.  Discussed having SLP come and evaluate her swallowing function to be able to advance her diet from liquids. Discussed that kidney function has returned to baseline. States her breathing is alright.  She stated that she had issues at the SNF she was out. Said she had muscle spasms and complained of staff troubles.    DC plan: bclofen 5 mg tid prn (no more meds)
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
## 2020-05-14 NOTE — ED Notes (Signed)
RT requested to [facility]

## 2020-05-14 NOTE — Progress Notes (Signed)
Performed bedside swallow screen ([facility] swallow screen), patient swallows liquids and applesauce well with no complications but is complaining that it is harder to swallow apple sauce (or other thickened substances) because her throat is extremely sore.  Patient may need to start with liquid diet and advance to thick liquids as her throat soreness improves---per order, I will be advising Dr Collene Gobble of my findings
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
## 2020-05-14 NOTE — ED Notes (Signed)
MAP > 65 goal for BP, page if unable to sustain after LR IV fluids boluses

## 2020-05-14 NOTE — ED Notes (Signed)
Pt alert after receiving narcan. Pt requesting something for pain. She states she takes percocet. This RN informed her she could not receive medication at this time

## 2020-05-14 NOTE — Progress Notes (Signed)
OT Cancellation Note  Patient Details Name: [name] MRN: [id number] DOB: 28-Feb-1951   Cancelled Treatment:    Reason Eval/Treat Not Completed: Patient not medically ready;Active bedrest order  [name]  [phone] 05/14/2020, 7:26 AM

## 2020-05-14 NOTE — H&P (Addendum)
Date: 05/14/2020               Patient Name:  [name] MRN: [id number]  DOB: [birth year] Age / Sex: 69 y.o., female   PCP: [name], MD         Medical Service: Internal Medicine Teaching Service         Attending Physician: Dr. Aldine Contes, MD    First Contact: Dr. Sanjuan Dame Pager: [phone]  Second Contact: Dr. Gilberto Better Pager: [phone]       After Hours (After 5p/  First Contact Pager: [phone]  weekends / holidays): Second Contact Pager: [phone]   Chief Complaint: Altered mental status  History of Present Illness: Ms. Brehanna "Denice" Schewe is a 69 year old female with past medical history of chronic back pain s/p L1 fracture, chronic hypoxic and hypercarbic respiratory failure, morbid obesity, HFpEF, chronic ITP, HTN who presented for evaluation of altered mental status.  Patient was recently admitted on 04/18/20 for sepsis secondary to lower extremity purulent cellulitis in the setting of chronic lower extremity venous stasis wounds, and acute on chronic hypercapnic, hypoxic respiratory failure. During her admission, she received antibiotics and required BiPAP at night. On 04/30/20, she was discharged to a skilled nursing facility where she stayed until 05/12/20. Per discussion with patient's daughter, [name], during her stay at the SNF, she complained that staff was being rough with her, not having her up and moving out of bed, and not tending to her chronic leg wounds. She was discharged yesterday to her daughter's house. Initially, patient was reportedly alert and joking. Upon arrival to the house, patient was required to ambulate up some stairs as they do not have a wheelchair ramp, which she did with significant difficulty. Following ambulation, she reported having significant back pain, worse from her baseline. Her daughter treated her pain with her prescribed percocet and baclofen. She had dinner last night without fluid intake. Upon waking up  this morning, patient reported to continue to be in significant pain and was slightly altered. She received additional percocet and baclofen at this time for pain. Throughout the day, she had no additional PO intake with worsening of her mental status, somnolence and "mumbling." This afternoon, she received additional percocet and baclofen for continuation of her pain. Since arriving home from the SNF, the only other home medication that she received was lasix. Due to her progressively worsening mental status, she was brought to the ED for evaluation.   ED Course: On arrival to the ED, patient was somnolent and minimally responsive. She was hypotensive (79/49), bradycardic (57), otherwise saturating well on home oxygen requirement of 3L. Initial labs: CMP, creatinine of 2.47 (up from baseline of 0.45), BUN 36; CBC, WBC 36.6 with neutrophilic predominance 44.0, PLT 39; VBG, pH 7.361, pCO2 59.3, HCO3 33.6; lactic acid 1.2. CXR revealed low lung volumes with no acute process; CTH showed generalized cerebral atrophy with no acute abnormality. She received ceftriaxone and 2L IVF. She was admitted to IMTS for further evaluation and management.  Meds:  No current facility-administered medications on file prior to encounter.   Current Outpatient Medications on File Prior to Encounter  Medication Sig Dispense Refill  . amLODipine (NORVASC) 5 MG tablet Take 1 tablet (5 mg total) by mouth daily. 30 tablet 1  . carvedilol (COREG) 6.25 MG tablet Take 1 tablet (6.25 mg total) by mouth 2 (two) times daily with a meal. 60 tablet 1  . furosemide (LASIX) 80 MG tablet TAKE 1  TABLET BY MOUTH DAILY; 1 TABLET TWICE DAILY IF YOU GAIN 3 POUNDS IN 1 WEEK OR 5 POUNDS IN 1 DAY (Patient taking differently: Take 80 mg by mouth See admin instructions. Take 80 mg by mouth once a day and increase to 80 mg two times a day if either 3 pounds are gained in a week OR 5 pounds in one day) 60 tablet 5  . lisinopril (PRINIVIL,ZESTRIL) 2.5 MG  tablet Take 1 tablet (2.5 mg total) by mouth daily. 30 tablet 1  . oxyCODONE-acetaminophen (PERCOCET) 10-325 MG tablet Take 1 tablet by mouth 3 (three) times daily.     . potassium chloride (K-DUR) 10 MEQ tablet Take 1 tablet (10 mEq total) by mouth daily. 30 tablet 1  . promethazine (PHENERGAN) 12.5 MG tablet Take 1 tablet (12.5 mg total) by mouth every 6 (six) hours as needed for nausea or vomiting. 30 tablet 0  . tiZANidine (ZANAFLEX) 2 MG tablet Take 2 mg by mouth every 6 (six) hours as needed for muscle spasms.     . baclofen 5 MG TABS Take one tablet three times per day for 3 days. If spasms do not improve, take 2 tablets three times per day. If still having spasms on 2 tablets three times daily, please call the clinic. 90 tablet 1  . diphenhydrAMINE-APAP, sleep, (TYLENOL PM EXTRA STRENGTH PO) Take 2 tablets by mouth at bedtime.     . polyethylene glycol (MIRALAX / GLYCOLAX) 17 g packet Take 17 g by mouth daily as needed for mild constipation (MIX AND DRINK).     Marland Kitchen senna-docusate (SENOKOT S) 8.6-50 MG tablet Take 2 tablets by mouth 2 (two) times daily. (Patient taking differently: Take 2 tablets by mouth 2 (two) times daily as needed for mild constipation or moderate constipation. ) 120 tablet 3  . Sennosides (EX-LAX PO) Take 1 tablet by mouth as needed (for constipation).      Allergies: Allergies as of 05/13/2020  . (No Known Allergies)   Past Medical History:  Diagnosis Date  . Arthritis    lumbar burst fx, osteoporosis  . Back pain 09/29/2013  . Blood dyscrasia   . Chest pain    denies  . Chronic kidney disease    passed stones spontaneously- 40 yrs. ago   . Depression    states her current situation with activity limitations, she has a low spirit at times.   Marland Kitchen Dysrhythmia    occ tachycardia  . Fracture of vertebra, compression ([place])   . GERD (gastroesophageal reflux disease)    occ  . History of kidney stones   . HTN (hypertension)   . ITP (idiopathic thrombocytopenic  purpura) 08/29/2013  . Leukocytosis, unspecified 09/05/2013  . Muscle cramp 11/16/2013  . Tachycardia    occ upon exersion, and when put on bp med   Family History:  Family History  Problem Relation Age of Onset  . Hypertension Other   . Heart attack Other   . Leukemia Paternal Uncle        leukemia  . Heart attack Mother   . Hypertension Mother   . Heart disease Father    Social History:  Patient currently lives at home with daughter, [name], and daughter's fiance. [name] reports that she is the primary caretaker of her mother. She has significant limitation in her mobility due to her morbid obesity. She is a former smoker with a 15-pack year smoking history.  Review of Systems: A complete ROS was negative except as per HPI.  Physical Exam: Blood pressure (!) 85/29, pulse 72, resp. rate 14, height 5' (1.524 m), weight 102 kg, SpO2 99 %. Physical Exam Vitals and nursing note reviewed.  Constitutional:      Appearance: She is obese. She is ill-appearing.     Comments: Somnolent  HENT:     Head: Normocephalic and atraumatic.  Eyes:     Extraocular Movements: Extraocular movements intact.     Conjunctiva/sclera: Conjunctivae normal.     Comments: Pinpoint pupils bilaterally  Cardiovascular:     Rate and Rhythm: Normal rate and regular rhythm.     Pulses: Normal pulses.     Heart sounds: Murmur heard.   Pulmonary:     Effort: No respiratory distress.     Breath sounds: Normal breath sounds.     Comments: Diminished lung sounds throughout Abdominal:     General: Abdomen is flat. Bowel sounds are normal. There is distension.     Palpations: Abdomen is soft.  Musculoskeletal:     Cervical back: Normal range of motion and neck supple.     Right lower leg: Edema present.     Left lower leg: Edema present.  Skin:    General: Skin is warm and dry.     Comments: Symmetric, bilateral lower extremity erythematous plaques with overlying scale and crust.  Neurological:      General: No focal deficit present.     Mental Status: She is oriented to person, place, and time.     Comments: Minimally responsive to physical stimuli, but oriented x3. She is able to follow basic commands such as squeezing fingers and moving toes.    EKG: personally reviewed my interpretation is sinus rhythm  CXR: personally reviewed my interpretation is no acute cardiopulmonary abnormality.  Assessment & Plan by Problem: Active Problems:   Shock [facility])  Ms. Kyasia "Denice" Schommer is a 69 year old female with past medical history of chronic back pain s/p L1 fracture, chronic hypoxic and hypercarbic respiratory failure, morbid obesity, HFpEF, chronic ITP, HTN who presented for evaluation of altered mental status.  #Acute encephalopathy #Chronic back pain s/p L1 fracture Patient presenting with progressively worsening mentation since discharge from her SNF. Patient was at her baseline mental status on 05/12/20. Patient experienced significant pain while ambulating up stairs to daughter's house because of a lack of wheelchair ramp. She received multiple doses of her home percocet and baclofen (prescribed for back pain) in the setting of inadequate oral intake. She subsequently developed altered mentation which progressively worsened with repeated dosing of her home centrally acting medications. Her encephalopathy is likely secondary to impaired clearance of these centrally acting medications in the setting of her AKI. Upon receiving 0.4mg  of narcan, she had rapid improvement of her mentation. -Redose narcan as needed -Holding centrally acting home medications  #Distributive shock Patient's initial blood pressure on arrival to the ED was 79/49. She initially received 2L IVF boluses with improvement of her MAP. Her hypotension is likely secondary to hypovolemia due to poor PO intake for the past few days. She has a mild leukocytosis with no lactic acidosis and no signs or symptoms to suggest an  underlying infection. -1L bolus of IVF -LR 12mL/hr -Maintain MAP >65 -Repeat lactic acid -CBC -Procalcitonin  #AKI Creatinine elevated today to 2.47 up from her baseline of approximately 0.5. Suspect that her AKI is pre-renal secondary to very poor PO intake for the past few days with unknown intake while at her skilled nursing facility for two weeks. -1L bolus of  IVF -LR 173mL/hr -BMP in AM  #Acute on Chronic Hypoxic and Hypercapnic Respiratory Failure Patient is on 3L oxygen at home for chronic hypoxia. During previous hospitalization, she required nightly use of BiPAP with altered mentation if not adequate adherence. She is not tachypneic and she is saturating well on home oxygen requirement. ABG here reveals pH of 7.355, pO2 of 55, pCO2 of 57.5, and bicarbonate of 31.8. It does not appear that patient has been using BiPAP since hospital discharge per daughter's report. -BiPAP qhs with goal O2 saturation >92% -Continue home 3L oxygen during day  #Chronic venous stasis dermatitis Patient has chronic history of venous stasis dermatitis requiring continued treatment. Daughter reports that the patient did not receive appropriate management of her dermatitis while in SNF. Her legs today appear consistent with her chronic condition. Her dermatitis is symmetric with no significant tenderness to palpation. Her rash does not appear consistent with cellulitis in either leg, however she does have a mild leukocytosis and the severity of her dermatitis impairs the ability to visualize a potential underlying acute infection. She has received ceftriaxone in the ED. -Apply bilateral unna boot -Continue to monitor for signs of infection  #Severe immobility Patient has significantly impaired mobility and disability. While at SNF, she reportedly continued to have minimal functioning with discharge to her daughter's house where she was unable to get up stairs.  -PT/OT eval and treat -Transition of care  consult  #Chronic ITP Platelets today 39,000 down slightly from baseline of approximately 50,000. -Monitor for signs of bleeding -CBC  #HTN Holding home antihypertensives in the setting of her distributive shock -Reintroduce home antihypertensive as blood pressure improves.  #Code status: Full #Diet: NPO #IVF: LR 152mL/hr #VTE ppx: Heparin #Bowel regimen: None  Dispo: Admit patient to Inpatient with expected length of stay greater than 2 midnights.  Signed: Cato Mulligan, MD 05/14/2020, 1:05 AM  Pager: [phone] After 5pm on weekdays and 1pm on weekends: On Call pager: [phone]

## 2020-05-14 NOTE — Progress Notes (Signed)
Patient currently of BiPAP and on [place] and doing well. RT to monitor as needed

## 2020-05-14 NOTE — Progress Notes (Signed)
PT Cancellation Note  Patient Details Name: KANIYA TRUEHEART MRN: [id number] DOB: 06/24/1951   Cancelled Treatment:    Reason Eval/Treat Not Completed: Active bedrest order   Sandy Salaam Prater 05/14/2020, 7:23 AM  Bayard Males, PT Acute Rehabilitation Services Pager: [phone] Office: [phone]

## 2020-05-14 NOTE — Progress Notes (Addendum)
Advised nite shift rounding IMTS team, dr Collene Gobble, patient's blood pressure has been consistently low today, esp diastolic numbers as low as 38, never above 60, maps are also below 60, patient is mostly asymptomatic except for being sleepy and fatigued,but answers all questions appropriately, alert and oriented x4 now;  do we need boundaries for what is too low and do we need to start IV fluids, he will review patient's history and talk over with night team, will put in boundaries if needed, but continue to monitor bp readings and if patient becomes symptomatic, page night team to advise

## 2020-05-14 NOTE — Progress Notes (Signed)
Subjective:  Patient seen at bedside this AM, initially asleep but woken up easily. Reports she is upset to be back in hospital. Believes she was admitted 2/2 hypotension. Discussed poor po intake and Percocet usage likely caused symptoms. Mentioned she would like her daughter and son to have updates. No other complaints at this time.  Objective:  Vital signs in last 24 hours: Vitals:   05/14/20 0200 05/14/20 0215 05/14/20 0245 05/14/20 0510  BP: 137/85 113/62 (!) 116/100 (!) 105/53  Pulse: (!) 120 96 62 86  Resp: (!) 28 (!) 26 20 20   Temp:    98.1 F (36.7 C)  TempSrc:    Axillary  SpO2: 94% 92% 98% 95%  Weight:    101.4 kg  Height:       Physical Exam: General: Obese female with BiPAP machine, no acute distress CV: Regular rate, rhythm. No m/r/g Pulm: Diminished sounds, clear to auscultation bilaterally Abdomen: Distended, non-tender MSK: Bilateral non-purulent crusting, scales on erythematous base below knees Neuro: Easily arousable, oriented x3  Assessment/Plan:  Active Problems:   Chronic back pain secondary to traumatic L1 fracture   Venous stasis dermatitis   BMI 40.0-44.9, adult (HCC)   AKI (acute kidney injury) ([place])   Obesity hypoventilation syndrome (HCC)   Chronic respiratory failure with hypoxia and hypercapnia (HCC)   Shock (HCC)   Chronic, continuous use of opioids   Opioid overdose (HCC)   Acute on chronic respiratory failure with hypoxia and hypercapnia ([place])  Ms. Allender is 69yo female with chronic hypoxic, hypercapnic respiratory failure, morbid obesity, HFpEF, chronic back pain s/p L1 fracture, chronic ITP admitted for altered mental status and hypotension.  #Acute encephalopathy Patient discharged from SNF on 05/12/20, mentating well at the time. Gradual AMS over 24 hours once she arrived home. Reportedly received multiple doses of home percocet, baclofen for back pain. Patient reportedly with decreased po intake during this time as well (presumable  at SNF as well). Given AKI, likely decreased clearance of medications, leading to AMS.  She also has not been using BiPAP as prescribed. During last hospitalization, patient developed AMS s/p not using BiPAP. Patient received narcan in ED, with marked improvement afterward. This morning, patient asleep but easily arousable, oriented x3. Will continue to hold centrally acting medications and give narcan as needed. -Narcan as needed -holding centrally acting home meds  -PT/OT eval  #Acute kidney injury #Distributive shock Upon arrival, MAP's in 40's-50's. Received 3-4L fluid since arrival, MAP this AM in 70's. Likely 2/2 recent poor po intake. sCr on arrival 2.47, now 1.87 this AM (baseline sCr ~0.5). Holding IVF today given heart failure, will not want to overload on fluids. Low threshold to give IVF if becomes hypotensive. No evidence of infection, as she has been afebrile, normal lactic acid, no signs on exam. Per RN, bedside swallow done at bedside. No aspiration, patient reports irritation to throat, therefore will start patient on clear liquids and advanced diet as tolerated. Will continue to trend CMP, give IVF as needed for MAP>65. -Advance diet as tolerated -Daily BMP, CBC -IVF if MAP>65  #Acute on chronic hypoxic, hypercapnic respiratory failure Patient on supplemental O2 at home. Per daughter, patient has not been using BiPAP since discharge. ABG on arrival 7.355 / 58 / 32. Will continue using BiPAP QHS while inpatient and encourage use of BiPAP once stable and ready for discharge. -BiPAP QHS  #Chronic venous stasis dermatitis #Severe immobility Per daughter, patient's wounds were not cared for appropriately during SNF stay.  On examination, wounds are not purulent, non-tender, appear to be healing compared to last hospitalization. She received Rocephin in ED, although continues to be afebrile, mild leukocytosis (improving this AM, 12>11). Unclear on if she can be taken care of  appropriately at home, as she had minimal function at discharge from SNF, subsequently leading to a hospitalization after 1 day away from SNF.  -PT/OT eval -TOC  #Chronic ITP PLT 39,000. Will hold DVT PPX today and monitor daily. Will give once PLT >50,000. -Hold DVT PPX -Daily CBC  #HTN Given low MAPs, will hold home medications. Can give once patient improves clinically.  Diet: Clear liquids IVF: n/a DVT PPX: holding heparin (PLT<50) Code Status: Full Prior to Admission Living Arrangement: home Anticipated Discharge Location: unclear Barriers to Discharge: medical management Dispo: Anticipated discharge in approximately >3 day(s).   Sanjuan Dame, MD 05/14/2020, 6:23 AM Pager: [phone] After 5pm on weekdays and 1pm on weekends: On Call pager [phone]

## 2020-05-14 NOTE — Progress Notes (Signed)
Orthopedic Tech Progress Note Patient Details:  [name] 16-Sep-1950 [id number]  Ortho Devices Type of Ortho Device: Haematologist Ortho Device/Splint Location: BLE Ortho Device/Splint Interventions: Ordered, Application   Post Interventions Patient Tolerated: Well Instructions Provided: Care of device   Janit Pagan 05/14/2020, 1:50 PM

## 2020-05-14 NOTE — ED Notes (Signed)
Pts brief changed  

## 2020-05-14 NOTE — ED Notes (Signed)
Ortho called request unna boot

## 2020-05-15 DIAGNOSIS — G9340 Encephalopathy, unspecified: Secondary | ICD-10-CM

## 2020-05-15 DIAGNOSIS — J9612 Chronic respiratory failure with hypercapnia: Secondary | ICD-10-CM

## 2020-05-15 DIAGNOSIS — Z6841 Body Mass Index (BMI) 40.0 and over, adult: Secondary | ICD-10-CM

## 2020-05-15 DIAGNOSIS — I959 Hypotension, unspecified: Secondary | ICD-10-CM

## 2020-05-15 LAB — COMPREHENSIVE METABOLIC PANEL
ALT: 22 U/L (ref 0–44)
AST: 18 U/L (ref 15–41)
Albumin: 3 g/dL — ABNORMAL LOW (ref 3.5–5.0)
Alkaline Phosphatase: 70 U/L (ref 38–126)
Anion gap: 9 (ref 5–15)
BUN: 21 mg/dL (ref 8–23)
CO2: 33 mmol/L — ABNORMAL HIGH (ref 22–32)
Calcium: 8.7 mg/dL — ABNORMAL LOW (ref 8.9–10.3)
Chloride: 96 mmol/L — ABNORMAL LOW (ref 98–111)
Creatinine, Ser: 0.85 mg/dL (ref 0.44–1.00)
GFR calc Af Amer: >60 mL/min (ref >60)
GFR calc non Af Amer: >60 mL/min (ref >60)
Glucose, Bld: 136 mg/dL — ABNORMAL HIGH (ref 70–99)
Potassium: 3.7 mmol/L (ref 3.5–5.1)
Sodium: 138 mmol/L (ref 135–145)
Total Bilirubin: 0.4 mg/dL (ref 0.3–1.2)
Total Protein: 4.9 g/dL — ABNORMAL LOW (ref 6.5–8.1)

## 2020-05-15 LAB — CBC
HCT: 35.6 % — ABNORMAL LOW (ref 36.0–46.0)
Hemoglobin: 11.2 g/dL — ABNORMAL LOW (ref 12.0–15.0)
MCH: 29.2 pg (ref 26.0–34.0)
MCHC: 31.5 g/dL (ref 30.0–36.0)
MCV: 93 fL (ref 80.0–100.0)
Platelets: 38 10*3/uL — ABNORMAL LOW (ref 150–400)
RBC: 3.83 MIL/uL — ABNORMAL LOW (ref 3.87–5.11)
RDW: 16.3 % — ABNORMAL HIGH (ref 11.5–15.5)
WBC: 8.8 10*3/uL (ref 4.0–10.5)
nRBC: 0 % (ref 0.0–0.2)

## 2020-05-15 LAB — GLUCOSE, CAPILLARY
Glucose-Capillary: 86 mg/dL (ref 70–99)
Glucose-Capillary: 111 mg/dL — ABNORMAL HIGH (ref 70–99)

## 2020-05-15 MED ORDER — DICLOFENAC SODIUM 1 % EX GEL
4.0000 g | Freq: Four times a day (QID) | CUTANEOUS | Status: AC | PRN
  Administered 2020-05-15 – 2020-05-17 (×2): 4 g via TOPICAL
  Filled 2020-05-15: qty 100

## 2020-05-15 MED ORDER — LIDOCAINE 5 % EX PTCH
1.0000 | MEDICATED_PATCH | CUTANEOUS | Status: AC
  Administered 2020-05-15 – 2020-05-17 (×3): 1 via TRANSDERMAL
  Filled 2020-05-15 (×3): qty 1

## 2020-05-15 MED ORDER — LOPERAMIDE HCL 2 MG PO CAPS
2.0000 mg | ORAL_CAPSULE | ORAL | Status: DC | PRN
  Administered 2020-05-15 – 2020-05-16 (×3): 2 mg via ORAL
  Filled 2020-05-15 (×3): qty 1

## 2020-05-15 NOTE — Plan of Care (Signed)

## 2020-05-15 NOTE — Progress Notes (Signed)
Subjective:  Patient seen at bedside this AM. Mentions her back continues to hurt, but breathing okay this AM. Reports she is not using BiPAP because "she doesn't need it because oxygen levels are high." Discussed importance of BiPAP machine and need to keep CO2 levels down. Mentioned the BiPAP mask hurts her face. Also discussed SLP eval for advancing diet. Describes throat soreness because of a tube they put down her throat "so she wouldn't die." Encouraged patient to use BiPAP and discussed improving kidney function.  Objective:  Vital signs in last 24 hours: Vitals:   05/14/20 1937 05/14/20 2300 05/15/20 0308 05/15/20 0601  BP: (!) 119/52 116/60 (!) 106/52   Pulse: 80 84 80   Resp: 20 20 20    Temp: 98.9 F (37.2 C) 98.5 F (36.9 C) 98.6 F (37 C)   TempSrc: Oral Oral Oral   SpO2: 98% 100% 100%   Weight:    103.9 kg  Height:       Physical Exam: General: Obese, unkept, ill-appearing female CV: Regular rate, rhythm. Pulm: Unable d/t perform given patient's pain Abdomen: Distended, soft, non-tender MSK: Bilateral legs wrapped w/ gauze  Assessment/Plan:  Active Problems:   Chronic back pain secondary to traumatic L1 fracture   Venous stasis dermatitis   BMI 40.0-44.9, adult (HCC)   AKI (acute kidney injury) ([place])   Obesity hypoventilation syndrome (HCC)   Chronic respiratory failure with hypoxia and hypercapnia (HCC)   Pressure injury of skin   Shock (HCC)   Chronic, continuous use of opioids   Opioid overdose ([place])   Acute on chronic respiratory failure with hypoxia and hypercapnia ([place])  Ms. Hadaway is 69yo female with chronic hypoxic, hypercapnic respiratory failure, morbid obesity, HFpEF, chronic back pain s/p L1 fracture, chronic ITP admitted for altered mental status, hypotension, now oriented and mentating well.  #Hypotension #AKI, resolved Yesterday evening, BP dropped 88/38. On call team gave 1L bolus, subsequently improved BP. This AM, MAP 70's. Continues  to be afebrile, no signs of infection, leukocytosis resolved. Kidney function back to baseline sCr 0.85 < 1.87 s/p 1L IVF yesterday. Patient's blood pressure continues to be soft, will hold antihypertensives for now. If BP drops again, can give IVF but will need to be careful given patient's HFpEF. Patient reportedly w/ poor po intake at SNF and home. Yesterday patient tolerated bedside swallow but patient reports she cannot swallow well, will have SLP evaluation today. -Advancing diet as tolerated -SLP eval -Daily BMP, CBC -Slow IVF if needed, goal MAP>65  #Acute encephalopathy Patient mentating well this AM, fully oriented. Original presentation most likely 2/2 baclofen, percocet use in setting of acute kidney injury. Holding off opioid use for back pain for now. Patient also not using BiPAP at home. Discussed importance of BiPAP use, as she developed AMS during last hospitalization after refusing BiPAP for multiple days in a row. Holding home meds, will continue to monitor. -Holding centrally acting medications  #Chronic hypoxic, hypercapnic respiratory failure Patient denies SOB this AM. ABG on arrival 7.36 / 58 / 32. Patient reportedly not using BiPAP at home due to O2 levels being sufficient. Explained to patient the concern regarding CO2 levels as well. She seemed hesitant but agreeable to wearing BiPAP at night. States mask hurts face and nose, will discuss with RT as she might need different mask. Will need to continue to monitor if further fluids are given, as patient's HFpEF leaves her susceptible for pulmonary edema. -BiPAP QHS  #Chronic venous stasis dermatitis #Severe immobility  No signs of infection, leukocytosis resolved, s/p Rocephin in ED. Wounds wrapped with gauze this AM. Discussed with patient's daughter yesterday that she has much difficulty maneuvering around the house and is not able to function independently. States multiple neighbors had to help push patient up the stairs  into the house when she arrived from SNF. Also discussed care patient received at [facility]. Per patient's daughter, staff was rude to patient and did not fill her necessary needs. She states if patient needs SNF, she requests another facility, such as [place]. PT/OT eval yesterday recommending SNF. In my medical opinion, I agree with this assessment as it is unlikely Ms. Dellis will do well at home given her severe immobility and complex medical issues. Will discuss with patient and family. -SNF per PT/OT -Appreciate TOC, CSW assistance  #Back, knee pain She endorses worsening back and knee pain since her time at SNF, leading to increased usage in percocet, baclofen at home. Holding any opioid medications for now given acute encephalopathy.  -Voltaren gel PRN for knee pain -Lidocaine patch for back pain  #Chronic ITP PLT 38,000. Holding DVT PPX, continue to monitor. Will give ppx once PLT>50,000.  Diet: Clear liquids IVF: n/a DVT PPX: holding (PLT <50) Code Status: Full Prior to Admission Living Arrangement: home Anticipated Discharge Location: SNF Barriers to Discharge: medical management Dispo: Anticipated discharge in approximately 2-3 day(s).   Sanjuan Dame, MD 05/15/2020, 6:18 AM Pager: [phone] After 5pm on weekdays and 1pm on weekends: On Call pager [phone]

## 2020-05-15 NOTE — TOC Initial Note (Signed)
Transition of Care [facility]) - Initial/Assessment Note    Patient Details  Name: [name] MRN: [id number] Date of Birth: 1951-04-01  Transition of Care [facility]) CM/SW Contact:    [address], [place] Phone Number: 05/15/2020, 1:51 PM  Clinical Narrative:                 CSW received consult for possible SNF placement at time of discharge. CSW spoke with patient regarding PT recommendation of SNF placement at time of discharge. Patient reported that patient's daughter is currently unable to care for patient at their home given patient's current physical needs and fall risk. Patient expressed understanding of PT recommendation and is agreeable to SNF placement at time of discharge. Patient reports preference for [facility]. CSW discussed insurance authorization process and provided Medicare SNF ratings list. Patient expressed being hopeful for rehab and to feel better soon. No further questions reported at this time. Patient gave permission to speak with her daughter [name] [phone], [place] spoke with her and updated her on the plan. Stated that when pt was at [facility] she received horrible treatment and would not like her to go back. CSW explained to both patient and her daughter that pt is nearing copay days and will be responsible for $184 per day payment to the facility. CSW went over two current offers with patient's daughter of [facility] and [facility] , daughter said she would review offers and get back in touch with me. Pt is not vaccinated. CSW to continue to follow and assist with discharge planning needs.    Barriers to Discharge: Ship broker, Continued Medical Work up   Patient Goals and CMS Choice        Expected Discharge Plan and Services                                                Prior Living Arrangements/Services                       Activities of Daily Living      Permission Sought/Granted Permission sought to  share information with : Facility Sport and exercise psychologist, Family Supports Permission granted to share information with : Yes, Verbal Permission Granted     Permission granted to share info w AGENCY: SNF     Permission granted to share info w Contact Information: [phone]  Emotional Assessment              Admission diagnosis:  Shock ([place]) [R57.9] AKI (acute kidney injury) ([place]) [N17.9] Altered mental status, unspecified altered mental status type [R41.82] Patient Active Problem List   Diagnosis Date Noted  . Shock ([place]) 05/14/2020  . Chronic, continuous use of opioids 05/14/2020  . Opioid overdose ([place]) 05/14/2020  . Acute on chronic respiratory failure with hypoxia and hypercapnia ([place]) 05/14/2020  . Pressure injury of skin 04/22/2020  . Sepsis due to cellulitis ([place]) 04/18/2020  . Venous stasis dermatitis of both lower extremities 02/25/2019  . Encounter for preventive care 10/20/2018  . Chronic respiratory failure with hypoxia and hypercapnia ([place]) 10/06/2018  . Polycythemia, secondary 10/05/2018  . Obesity hypoventilation syndrome ([place]) 10/04/2018  . AKI (acute kidney injury) ([place])   . BMI 40.0-44.9, adult ([place])   . Bilateral lower extremity edema   . Venous stasis dermatitis 09/30/2018  . Wound of lower extremity, bilateral 09/30/2018  .  Chronic heart failure with preserved ejection fraction with moderate RV failure 06/09/2017  . GERD (gastroesophageal reflux disease) 11/13/2014  . Opiate-induced constipation 11/13/2014  . History of tobacco abuse 03/10/2014  . Chronic back pain secondary to traumatic L1 fracture 09/29/2013  . Chronic idiopathic thrombocytopenia ([place]) 08/29/2013  . HTN (hypertension)    PCP:  [name], MD Pharmacy:   [facility] [place], [place] - [address] AT [address] [place] [address] Phone: [phone] Fax: [phone]     Social Determinants of Health (SDOH)  Interventions    Readmission Risk Interventions No flowsheet data found.

## 2020-05-15 NOTE — Evaluation (Signed)
Clinical/Bedside Swallow Evaluation Patient Details  Name: [name] MRN: [id number] Date of Birth: 12-03-1950  Today's Date: 05/15/2020 Time: SLP Start Time (ACUTE ONLY): 1015 SLP Stop Time (ACUTE ONLY): 1035 SLP Time Calculation (min) (ACUTE ONLY): 20 min  Past Medical History:  Past Medical History:  Diagnosis Date  . Arthritis    lumbar burst fx, osteoporosis  . Back pain 09/29/2013  . Blood dyscrasia   . Chest pain    denies  . Chronic kidney disease    passed stones spontaneously- 40 yrs. ago   . Depression    states her current situation with activity limitations, she has a low spirit at times.   Marland Kitchen Dysrhythmia    occ tachycardia  . Fracture of vertebra, compression ([place])   . GERD (gastroesophageal reflux disease)    occ  . History of kidney stones   . HTN (hypertension)   . ITP (idiopathic thrombocytopenic purpura) 08/29/2013  . Leukocytosis, unspecified 09/05/2013  . Muscle cramp 11/16/2013  . Tachycardia    occ upon exersion, and when put on bp med   Past Surgical History:  Past Surgical History:  Procedure Laterality Date  . ABDOMINAL HYSTERECTOMY    . ANTERIOR LAT LUMBAR FUSION N/A 03/28/2014   Procedure: Anterolateral decompression of L1 fracture with posterior segmental stabilization;  Surgeon: Kristeen Miss, MD;  Location: [place] [facility];  Service: Neurosurgery;  Laterality: N/A;  Anterolateral decompression of Lumbar One fracture with posterior segmental stabilization  . CESAREAN SECTION     x2  . CHEST TUBE INSERTION Left 03/28/2014   Procedure: CHEST TUBE INSERTION;  Surgeon: Ivin Poot, MD;  Location: [facility];  Service: Thoracic;  Laterality: Left;  . INCISIONAL HERNIA REPAIR N/A 11/13/2014   Procedure: PRIMARY REPAIR INCARCERATED INCISIONAL HERNIA WITH PLACEMENT BIOLOGIC MESH;  Surgeon: Rolm Bookbinder, MD;  Location: [facility];  Service: General;  Laterality: N/A;  . LAPAROTOMY Right 09   ovarian cyst  . TOTAL ABDOMINAL HYSTERECTOMY W/  BILATERAL SALPINGOOPHORECTOMY  97   HPI:  69 yo admitted 8/29 with AMS from home after pain medication. Pt with admission 8/4-8/16 due to sepsis with LE cellutis and D/C to SNF from 8/16-8/28 then returned home. CXR  Low lung volumes, no acute process. Per chart pt complained of sore throat and cannot swallow well due to "having that tube so I wouldn't die." She is on a clear liquid diet. No previous ST notes found. PMHx; GERD, chronic back pain with lumbar fusion and L1 fx, respiratory failure, HTN, ITP, heart failure, obesity.   Assessment / Plan / Recommendation Clinical Impression  Pt reports sore throat and "they put something in there". She reports coughing "sometimes" with liquids. No documentation of intubation however suspect she may have had a oral or nasal pharyngeal tube to assist with airway patency in ED. She was very hesitant to consume cracker but did so without difficulty after encouragement. No s/s aspiration during any consistency taking sip after each bite cracker. Volitional cough is strong, no oral-motor impairments. Therapist recommended regular texture and provided a menu and education re: softer textures to order if desired. She preferred to stay on clear liquids and assisted to call and order beef broth (vs chicken). Pt does not need follow up ST and discussed with pt and RN that pt can notify nursing when she is ready to upgrade. Pills with liquids.    SLP Visit Diagnosis: Dysphagia, unspecified (R13.10)    Aspiration Risk  Mild aspiration risk  Diet Recommendation Other (Comment) (therapist recommends regular-pt prefers clears for now)   Liquid Administration via: Cup;Straw Medication Administration: Whole meds with liquid Supervision: Patient able to self feed Compensations: Slow rate;Small sips/bites Postural Changes: Seated upright at 90 degrees    Other  Recommendations Oral Care Recommendations: Oral care BID   Follow up Recommendations None      Frequency  and Duration            Prognosis        Swallow Study   General HPI: 69 yo admitted 8/29 with AMS from home after pain medication. Pt with admission 8/4-8/16 due to sepsis with LE cellutis and D/C to SNF from 8/16-8/28 then returned home. CXR  Low lung volumes, no acute process. Per chart pt complained of sore throat and cannot swallow well due to "having that tube so I wouldn't die." She is on a clear liquid diet. No previous ST notes found. PMHx; GERD, chronic back pain with lumbar fusion and L1 fx, respiratory failure, HTN, ITP, heart failure, obesity. Type of Study: Bedside Swallow Evaluation Previous Swallow Assessment: none Diet Prior to this Study: Thin liquids;Other (Comment) (clear liquids) Temperature Spikes Noted: No Respiratory Status: Nasal cannula History of Recent Intubation: No Behavior/Cognition: Alert Oral Cavity Assessment: Within Functional Limits Oral Care Completed by SLP: No Oral Cavity - Dentition: Missing dentition;Adequate natural dentition (missing several posterior) Vision: Functional for self-feeding Self-Feeding Abilities: Able to feed self Patient Positioning: Upright in bed Baseline Vocal Quality: Normal Volitional Cough: Strong Volitional Swallow: Able to elicit    Oral/Motor/Sensory Function Overall Oral Motor/Sensory Function: Within functional limits   Ice Chips Ice chips: Not tested   Thin Liquid Thin Liquid: Within functional limits Presentation: Cup;Straw    Nectar Thick Nectar Thick Liquid: Not tested   Honey Thick Honey Thick Liquid: Not tested   Puree Puree: Within functional limits   Solid     Solid: Within functional limits      Houston Siren 05/15/2020,11:02 AM  Orbie Pyo Colvin Caroli.Ed Risk analyst [phone] Office [phone]

## 2020-05-15 NOTE — Progress Notes (Addendum)
Patient mews scored changed to yellow due to an elevated Heart rate and respirations. Patient alert and oriented. Vitals rechecked. No distress. Patient keeps moving the head of her bed up and down attempting to get more comfortable. Will retake at a resting period.   0043- Patient mews fired yellow again due to her elevated respirations. In bed, alert with c pap on. Easily awaken. Vitals retaken. Turned mews back to green.

## 2020-05-15 NOTE — Progress Notes (Signed)
MD on call paged due to patient's c/o runny stools.  New order for imodium at this time.

## 2020-05-16 DIAGNOSIS — G8929 Other chronic pain: Secondary | ICD-10-CM

## 2020-05-16 DIAGNOSIS — M25569 Pain in unspecified knee: Secondary | ICD-10-CM

## 2020-05-16 DIAGNOSIS — M549 Dorsalgia, unspecified: Secondary | ICD-10-CM

## 2020-05-16 DIAGNOSIS — I878 Other specified disorders of veins: Secondary | ICD-10-CM

## 2020-05-16 LAB — CBC
HCT: 35.6 % — ABNORMAL LOW (ref 36.0–46.0)
Hemoglobin: 10.8 g/dL — ABNORMAL LOW (ref 12.0–15.0)
MCH: 28.5 pg (ref 26.0–34.0)
MCHC: 30.3 g/dL (ref 30.0–36.0)
MCV: 93.9 fL (ref 80.0–100.0)
Platelets: 45 10*3/uL — ABNORMAL LOW (ref 150–400)
RBC: 3.79 MIL/uL — ABNORMAL LOW (ref 3.87–5.11)
RDW: 15.9 % — ABNORMAL HIGH (ref 11.5–15.5)
WBC: 6.8 10*3/uL (ref 4.0–10.5)
nRBC: 0 % (ref 0.0–0.2)

## 2020-05-16 LAB — COMPREHENSIVE METABOLIC PANEL
ALT: 18 U/L (ref 0–44)
AST: 12 U/L — ABNORMAL LOW (ref 15–41)
Albumin: 3 g/dL — ABNORMAL LOW (ref 3.5–5.0)
Alkaline Phosphatase: 66 U/L (ref 38–126)
Anion gap: 8 (ref 5–15)
BUN: 9 mg/dL (ref 8–23)
CO2: 35 mmol/L — ABNORMAL HIGH (ref 22–32)
Calcium: 9.1 mg/dL (ref 8.9–10.3)
Chloride: 97 mmol/L — ABNORMAL LOW (ref 98–111)
Creatinine, Ser: 0.55 mg/dL (ref 0.44–1.00)
GFR calc Af Amer: >60 mL/min (ref >60)
GFR calc non Af Amer: >60 mL/min (ref >60)
Glucose, Bld: 95 mg/dL (ref 70–99)
Potassium: 3.8 mmol/L (ref 3.5–5.1)
Sodium: 140 mmol/L (ref 135–145)
Total Bilirubin: 0.4 mg/dL (ref 0.3–1.2)
Total Protein: 5 g/dL — ABNORMAL LOW (ref 6.5–8.1)

## 2020-05-16 LAB — GLUCOSE, CAPILLARY: Glucose-Capillary: 96 mg/dL (ref 70–99)

## 2020-05-16 MED ORDER — PROMETHAZINE HCL 25 MG PO TABS
12.5000 mg | ORAL_TABLET | Freq: Four times a day (QID) | ORAL | Status: DC | PRN
  Administered 2020-05-16 – 2020-05-19 (×6): 12.5 mg via ORAL
  Filled 2020-05-16 (×6): qty 1

## 2020-05-16 MED ORDER — RAMELTEON 8 MG PO TABS
8.0000 mg | ORAL_TABLET | Freq: Every day | ORAL | Status: DC
  Administered 2020-05-16 – 2020-05-18 (×3): 8 mg via ORAL
  Filled 2020-05-16 (×5): qty 1

## 2020-05-16 NOTE — Telephone Encounter (Signed)
Review meds 

## 2020-05-16 NOTE — Discharge Summary (Signed)
Name: [name] MRN: [id number] DOB: June 27, 1951 69 y.o. PCP: Evelyn Bennett, MD  Date of Admission: 05/13/2020  8:32 PM Date of Discharge: 05/18/20 Attending Physician: Evelyn Groves, DO  Discharge Diagnosis: 1. Distributive shock 2. Polypharmacy 3. AKI 4. Chronic hypoxic/hypercapneic respiratory failure secondary to obesity hypoventilation syndrome 5. Chronic ITP  Discharge Medications: Allergies as of 05/18/2020   No Known Allergies     Medication List    STOP taking these medications   amLODipine 5 MG tablet Commonly known as: NORVASC   lisinopril 2.5 MG tablet Commonly known as: ZESTRIL   oxyCODONE-acetaminophen 10-325 MG tablet Commonly known as: PERCOCET   polyethylene glycol 17 g packet Commonly known as: MIRALAX / GLYCOLAX   promethazine 12.5 MG tablet Commonly known as: PHENERGAN   senna-docusate 8.6-50 MG tablet Commonly known as: Senokot S   tiZANidine 2 MG tablet Commonly known as: ZANAFLEX   TYLENOL PM EXTRA STRENGTH PO     TAKE these medications   Baclofen 5 MG Tabs Take 5 mg by mouth every 8 (eight) hours as needed for muscle spasms. What changed:   how much to take  how to take this  when to take this  reasons to take this  additional instructions   carvedilol 3.125 MG tablet Commonly known as: COREG Take 1 tablet (3.125 mg total) by mouth 2 (two) times daily with a meal. What changed:   medication strength  how much to take   diclofenac Sodium 1 % Gel Commonly known as: VOLTAREN Apply 4 g topically 4 (four) times daily as needed (knee pain).   furosemide 80 MG tablet Commonly known as: LASIX TAKE 1 TABLET BY MOUTH DAILY; 1 TABLET TWICE DAILY IF YOU GAIN 3 POUNDS IN 1 WEEK OR 5 POUNDS IN 1 DAY What changed: See the new instructions.   lidocaine 5 % Commonly known as: Lidoderm Place 1 patch onto the skin every 12 (twelve) hours as needed. Remove & Discard patch within 12 hours or as directed by MD   loperamide  2 MG capsule Commonly known as: IMODIUM Take 1 capsule (2 mg total) by mouth as needed for diarrhea or loose stools.   potassium chloride 10 MEQ tablet Commonly known as: KLOR-CON Take 1 tablet (10 mEq total) by mouth daily.   ramelteon 8 MG tablet Commonly known as: ROZEREM Take 1 tablet (8 mg total) by mouth at bedtime.            Durable Medical Equipment  (From admission, onward)         Start     Ordered   05/17/20 1434  For home use only DME standard manual wheelchair with seat cushion  Once       Comments: Patient suffers from   Chronic back pain secondary to traumatic L1 fracture which impairs their ability to perform daily activities like ambulation  in the home.  A cane will not resolve issue with performing activities of daily living. A wheelchair will allow patient to safely perform daily activities. Patient can safely propel the wheelchair in the home or has a caregiver who can provide assistance. Length of need 6 months . Accessories: elevating leg rests (ELRs), wheel locks, extensions and anti-tippers.   05/17/20 1434   05/17/20 1432  For home use only DME Hospital bed  Once       Question Answer Comment  Length of Need 6 Months   Patient has (list medical condition): Chronic back pain secondary to traumatic L1  fracture   The above medical condition requires: Patient requires the ability to reposition frequently   Head must be elevated greater than: 45 degrees   Bed type Semi-electric   Hoyer Lift Yes   Support Surface: Gel Overlay      05/17/20 1432           Discharge Care Instructions  (From admission, onward)         Start     Ordered   05/18/20 0000  Discharge wound care:       Comments: [name] has been ordered for woud care   05/18/20 1317          Disposition and follow-up:   [name] was discharged from [facility] in Stable condition.  At the hospital follow up visit please address:  1.   Followup     A. Distributive shock and AMS 2/2 decreased PO intake due to polypharmacy -  including Percocet and Baclofen, was also taking OTC Tylenol PM for sleep - discharged with ramelteon and lower dose of baclofen - assess for excessive sedation on follow up      B. Obesity hypoventilation syndrome - Patient had difficulty tolerating nighttime BiPAP mask during hospitalization. Assess for compliance with mask      C. Chronic ITP - maximum platelet count of 54,000 during admission. No signs of bleeding.      D. HTN/HFpEF - stopped all her HTN meds upon admission, restarted her Coreg at 3.125mg  BID (1/2 home dose), BP 140s/70s on day of discharge. Discharging at decreased Coreg dose and home Lasix dose. Consider increase Coreg to prior doss of 6.25mg  BID and resuming prior meds of Amlodipine 5mg  and lisinopril 2.5mg .     E. AKI - resolved  2.  Labs / imaging needed at time of follow-up: BMP, CBC  3.  Pending labs/ test needing follow-up: none  Follow-up Appointments:  Follow-up Information    [facility].   Specialty: Home Health Services Contact information: [address]  [place] [address] [phone]               Hospital Course by problem list: Distributive shock due to decreased PO intake 2/2 polypharmacy AKI Acute encephalopathy 2/2 polypharmacy Patient presented after recent hospitalization for sepsis 2/2 cellulitis with AMS and hypotension with MAPs in 50s-60s. Mental status improved markedly with narcan administration x1, and blood pressure responded well to IV fluids. Was also found to have an AKI which responded well to fluids, resolving by day 3 of hospitalization. We held her home amlodipine, lisinopril, Coreg, and Lasix. Coreg was resumed at 1/2 her home dose. BP has been normotensive for several days by time of discharge. Lasix resumed at home dose. Consider resuming her other medications at outpatient follow up.  Chronic back pain, knee  pain Was receiving percocet, tizanidine, and baclofen for this at home, likely leading to her altered mental status. Discharged with lidocaine patch, voltaren gel, and lower dose of baclofen prn.  Chronic hypoxic, hypercapnic respiratory failure Obesity hypoventilation syndrome ABG on arrival 7.36 / 58 / 32. She is reluctant to use her BiPAP. due to discomfort.  Was discharged with BiPAP at last hospitalization which she received at SNF, but never received at home due to cost. Worked extensively on this with patient's daughter and case Freight forwarder. They will receive the machine from [employer] and have applied for hardship assistance. Have reinforced the importance of preventing hypercapnea.  Chronic venous stasis dermatitis Severe immobility No signs of infection, had a mild leukocytosis that resolved. Patient was discharged to SNF after last hospitalization, then had only a few days at home during which time there was significant difficulty with mobility and pain, leading to heavy use of baclofen and percocet which resulted in the current admission. Venous stasis treated with Unna boots during hospitalization, home health nursing ordered to continue with wound care. Also home health PT and OT ordered. Attempted to get SNF placement, but had difficulty with placement due to payment issues and patient preferred home health.   Chronic ITP Presented with platelet count of 39,000 which trended upward gradually to 54,000 on day of admission without intervention. No bleeding during this hospitalization.  Discharge Vitals:   BP (!) 153/72 (BP Location: Left Arm)   Pulse 82   Temp 98.4 F (36.9 C) (Oral)   Resp 20   Ht 5' (1.524 m)   Wt 100.4 kg   SpO2 95%   BMI 43.23 kg/m   Pertinent Labs, Studies, and Procedures:  CT Head Wo Contrast  Result Date: 05/13/2020 CLINICAL DATA:  Syncope. EXAM: CT HEAD WITHOUT CONTRAST TECHNIQUE: Contiguous axial images were obtained from the base of the skull through the  vertex without intravenous contrast. COMPARISON:  None. FINDINGS: Brain: There is mild cerebral atrophy with widening of the extra-axial spaces and ventricular dilatation. There are areas of decreased attenuation within the white matter tracts of the supratentorial brain, consistent with microvascular disease changes. Vascular: No hyperdense vessel or unexpected calcification. Skull: Normal. Negative for fracture or focal lesion. Sinuses/Orbits: No acute finding. Other: None. IMPRESSION: 1. Generalized cerebral atrophy. 2. No acute intracranial abnormality. Electronically Signed   By: Virgina Norfolk M.D.   On: 05/13/2020 23:38   BMP Latest Ref Rng & Units 05/17/2020 05/16/2020 05/15/2020  Glucose 70 - 99 mg/dL 91 95 136(H)  BUN 8 - 23 mg/dL 7(L) 9 21  Creatinine 0.44 - 1.00 mg/dL 0.56 0.55 0.85  BUN/Creat Ratio 12 - 28 - - -  Sodium 135 - 145 mmol/L 142 140 138  Potassium 3.5 - 5.1 mmol/L 3.9 3.8 3.7  Chloride 98 - 111 mmol/L 99 97(L) 96(L)  CO2 22 - 32 mmol/L 35(H) 35(H) 33(H)  Calcium 8.9 - 10.3 mg/dL 9.3 9.1 8.7(L)    CBC Latest Ref Rng & Units 05/18/2020 05/17/2020 05/16/2020  WBC 4.0 - 10.5 K/uL 8.0 8.2 6.8  Hemoglobin 12.0 - 15.0 g/dL 11.7(L) 11.2(L) 10.8(L)  Hematocrit 36 - 46 % 38.0 38.1 35.6(L)  Platelets 150 - 400 K/uL 54(L) 46(L) 45(L)     Discharge Instructions: Discharge Instructions    Call MD for:  extreme fatigue   Complete by: As directed    Call MD for:  persistant dizziness or light-headedness   Complete by: As directed    Diet - low sodium heart healthy   Complete by: As directed    Diet Carb Modified   Complete by: As directed    Discharge instructions   Complete by: As directed    Ms. Boeh, it has been a pleasure taking care of you! I believe that the reason you became confused is an interaction between multiple medications that can be sedating.       For your back pain, we are using baclofen 5mg  every 8 hours as needed, as well as Lidocaine patch and Voltaren gel.  You can also try using a warm compress. We are holding off on the Percocet and tizanidine for  now. If needed, your PCP can increase these medication.     Because you came in with low blood pressure, we stopped all your blood pressure medications while you were here and have bene slowly restarting them. You will be taking the Coreg at 1/2 dose and your normal dose of Lasix. We are going to continue holding your lisinopril and amlodipine for now.     Please try to use your BiPAP machine at night. It will help reduce the carbon dioxide in your blood. You may have to adjust the mark to find how it works best for you. You can also try putting a bandage over the scab on your nose.  Here are your instructions. 1) Follow up with your PCP 2) for your pain,        -start baclofen 5mg  every 8 hours as needed        -start voltaren gel as needed        -start lidocaine patch every 12 hours as needed        -stop percocet, stop tizanodine 3) for your blood pressure        -resume your carvedilol (Coreg) at 3.125mg  twice a day (this is 1/2 your previous dose)        -resume your Lasix 80mg  daily        -resume your Klor-con        -stop your amlodipine and lisinopril 4) for sleep        -start ramelteon 8mg  nightly        -stop Tylenol PM   Discharge wound care:   Complete by: As directed    [place] has been ordered for woud care   Increase activity slowly   Complete by: As directed       Signed: Andrew Au, MD 05/18/2020, 3:16 PM   Pager: [phone]

## 2020-05-16 NOTE — Progress Notes (Signed)
Subjective:   Patient interviewed at bedside. She feels well this morning, main complaint is her chronic back pain. Denies SOB, confusion. Did have some loose stools, which she states did not occur while she was on her chronic opioids.   Spoke about SNF placement today. She is reluctant to go because of prior experience. We reassured her, and will work on SNF placement.  Objective:  Vital signs in last 24 hours: Vitals:   05/15/20 2350 05/16/20 0000 05/16/20 0047 05/16/20 0300  BP:  138/70  (!) 145/70  Pulse: 83 81 72 82  Resp: 18 (!) 27 (!) 24 (!) 25  Temp:    98.5 F (36.9 C)  TempSrc:    Oral  SpO2: 100% 98% 96% 99%  Weight:    100.4 kg  Height:        Physical Exam Constitutional: no acute distress Head: atraumatic ENT: external ears normal Eyes: EOMI Cardiovascular: regular rate and rhythm, normal heart sounds Pulmonary: effort normal, minimal wheezing in lung bases, otherwise lungs clear to ascultation bilaterally Abdominal: flat, nontender, no rebound tenderness, bowel sounds normal Musculoskeletal: bilateral legs are bandaged Skin: warm and dry Neurological: alert, no focal deficit Psychiatric: normal mood and affect  Assessment/Plan: [name] is a 69 y.o. female with hx of chronic hypoxic, hypercapnic respiratory failure, morbid obesity, HFpEF, chronic back pain s/p L1 fracture, chronic ITP admitted for altered mental status, hypotension, and AKI. Now oriented and mentating well and AKI resolved s/o IVF.  Active Problems:   Chronic back pain secondary to traumatic L1 fracture   Venous stasis dermatitis   BMI 40.0-44.9, adult (HCC)   AKI (acute kidney injury) ([place])   Obesity hypoventilation syndrome (HCC)   Chronic respiratory failure with hypoxia and hypercapnia (HCC)   Pressure injury of skin   Shock (HCC)   Chronic, continuous use of opioids   Opioid overdose ([place])   Acute on chronic respiratory failure with hypoxia and hypercapnia  ([place])  #Hypotension #AKI, resolved Previously had soft blood pressure but had sustained improvement over the past 24 hours. No further IVF administered since 8/30. Continues to be afebrile, no signs of infection, leukocytosis resolved. Kidney function back to baseline sCr 0.55 < 0.85. If BP drops again, can give IVF but will need to be careful given patient's HFpEF. Patient reportedly w/ poor po intake at SNF and home. SLP for swallow eval yesterday approved the patient for regular, but she requests clear liquids due to sore throat. -continue holding home amlodipine, carvedilol, and lisinopril for now -clear liquid diet, advance as tolerated -Daily BMP, CBC -Slow IVF if needed, goal MAP>65  #Acute encephalopathy Patient mentating well this AM, fully oriented. Original presentation most likely 2/2 baclofen, percocet use in setting of acute kidney injury. Reportedly had improvement after naloxone. Holding off opioid use for back pain for now. Patient also not using BiPAP at home. Discussed importance of BiPAP use, as she developed AMS during last hospitalization after refusing BiPAP for multiple days in a row. Holding home meds, will continue to monitor. -Holding centrally acting medications  #Chronic hypoxic, hypercapnic respiratory failure Patient denies SOB this AM, currently on 4L O2 and is on 3L at home. ABG on arrival 7.36 / 58 / 32. Patient reportedly not using BiPAP at home due to O2 levels being sufficient. Explained to patient the concern regarding CO2 levels as well. She is still reluctant to wear the BiPAP because thes mask hurts face and nose, especially at the location of a  scab at her nose. RT tried a different mask last night which is somewhat better. Will need to continue to monitor if further fluids are given, as patient's HFpEF leaves her susceptible for pulmonary edema. -BiPAP QHS, try applying a dressing to the scab on her nose  #Chronic venous stasis dermatitis #Severe  immobility No signs of infection, leukocytosis resolved, s/p Rocephin in ED. Wounds wrapped with gauze. Discussed with patient's daughter that she has much difficulty maneuvering around the house and is not able to function independently. States multiple neighbors had to help push patient up the stairs into the house when she arrived from SNF. Also discussed care patient received at [facility]. Per patient's daughter, staff was rude to patient and did not fill her necessary needs. She states if patient needs SNF, she requests another facility, such as [facility]. PT/OT eval yesterday recommending SNF. In my medical opinion, I agree with this assessment as it is unlikely [name] will do well at home given her severe immobility and complex medical issues. Due to recent SNF use, she has only 8 days before they will have to pay a co-pay. Social work following and helping with placement. Also having difficulty with placement due to unvaccinated status. -SNF per PT/OT -PT/OT eval and treat -patient will consider receiving the COVID vaccine here -Appreciate TOC, CSW assistance  #Chronic back pain, knee pain She endorses worsening back and knee pain since her time at SNF, leading to increased usage in percocet, baclofen at home. Holding any opioid medications for now given acute encephalopathy.  -Voltaren gel PRN for knee pain -Lidocaine patch and warm compresses for back pain  #Chronic ITP PLT 45,000 < 38,000. Holding DVT PPX, continue to monitor. Will give ppx once PLT>50,000.  Diet: clear liquids per patient preference due to sore throat, regular would be appropriate per SLP IVF: n/a VTE: holding due to platelets <50 Prior to Admission Living Arrangement: home Anticipated Discharge Location: SNF Barriers to Discharge: medical management Dispo: Anticipated discharge in approximately 1-2 day(s).   Andrew Au, MD 05/16/2020, 6:46 AM Pager: [phone] After 5pm on weekdays and 1pm on  weekends: On Call pager [phone]

## 2020-05-16 NOTE — Progress Notes (Signed)
[facility], [facility], and [facility] declined offer. CSW reached out to [facility] and [employer], waiting on response. CSW reached out to daughter of pt to ask if she has applied for facility medicaid.

## 2020-05-16 NOTE — Plan of Care (Signed)

## 2020-05-16 NOTE — Progress Notes (Signed)
Pt refuse NIV for the night, wants to just wear oxygen in nose.

## 2020-05-16 NOTE — Progress Notes (Signed)
Occupational Therapy Treatment Patient Details Name: [name] MRN: [id number] DOB: 1951/03/08 Today's Date: 05/16/2020    History of present illness 69 yo admitted 8/29 with AMS from home after pain medication. Pt with admission 8/4-8/16 due to sepsis with LE cellutis and D/C to SNF from 8/16-8/28 then returned home. PMHx; chronic back pain with lumbar fusion and L1 fx, respiratory failure, HTN, ITP, heart failure, obesity   OT comments  Pt requiring some encouragement to participate in therapy session (moreso due to anxiousness with movement vs not wanting to participate). She tolerated bed mobility and OOB to recliner via Stedy during session as well as increased time seated EOB (given bil UE support on Stedy) while nursing applying lidocaine patch. Pt continues to require two person assist for safe completion of mobility/transfers given weakness and body habitus. VSS throughout. Pt to benefit from continued acute OT services and continue to recommend SNF level therapies at time of discharge.    Follow Up Recommendations  SNF    Equipment Recommendations  3 in 1 bedside commode;Wheelchair (measurements OT);Wheelchair cushion (measurements OT);Other (comment) (TBD)          Precautions / Restrictions Precautions Precautions: Fall Precaution Comments: noted to have skin break down in peri area, pt with short round stature as a limitation in safe mobility Restrictions Weight Bearing Restrictions: No       Mobility Bed Mobility Overal bed mobility: Needs Assistance Bed Mobility: Supine to Sit     Supine to sit: Max assist;+2 for safety/equipment;+2 for physical assistance     General bed mobility comments: assist for LEs towards EOB, heavy assist for trunk elevation  Transfers Overall transfer level: Needs assistance   Transfers: Sit to/from Stand Sit to Stand: From elevated surface;+2 physical assistance;Min assist         General transfer comment: pillow placed  on stedy pad to cushion knees. pt able to power up from elevated surface onto stedy with pivot performed bed to chair with stedy. pt very short in height so transfer was made easier by positioning at EOB and into stedy. Max +2 to slide back in chair with use of pad and head reclined to slide back    Balance Overall balance assessment: Needs assistance Sitting-balance support: Bilateral upper extremity supported;Feet supported Sitting balance-Leahy Scale: Poor Sitting balance - Comments: reliant on bil UE support    Standing balance support: Bilateral upper extremity supported;During functional activity Standing balance-Leahy Scale: Poor Standing balance comment: reliant on BUE support                           ADL either performed or assessed with clinical judgement   ADL Overall ADL's : Needs assistance/impaired     Grooming: Minimal assistance;Sitting               Lower Body Dressing: Total assistance Lower Body Dressing Details (indicate cue type and reason): for socks             Functional mobility during ADLs: Moderate assistance;+2 for physical assistance (via Stedy, progressed to minA +2) General ADL Comments: use of Stedy for transfer OOB during session                       Cognition Arousal/Alertness: Awake/alert Behavior During Therapy: Flat affect Overall Cognitive Status: Impaired/Different from baseline Area of Impairment: Attention;Following commands;Awareness;Safety/judgement;Problem solving  Current Attention Level: Sustained   Following Commands: Follows one step commands consistently;Follows one step commands with increased time Safety/Judgement: Decreased awareness of safety;Decreased awareness of deficits Awareness: Emergent Problem Solving: Slow processing;Decreased initiation;Difficulty sequencing;Requires verbal cues;Requires tactile cues          Exercises     Shoulder Instructions        General Comments      Pertinent Vitals/ Pain       Pain Assessment: Faces Faces Pain Scale: Hurts even more Pain Location: back with certain movements Pain Descriptors / Indicators: Guarding;Sore;Discomfort Pain Intervention(s): Limited activity within patient's tolerance;Monitored during session;Repositioned;Other (comment) (RN applying lidocane patch)  Home Living                                          Prior Functioning/Environment              Frequency  Min 2X/week        Progress Toward Goals  OT Goals(current goals can now be found in the care plan section)  Progress towards OT goals: Progressing toward goals  Acute Rehab OT Goals Patient Stated Goal: be able to walk OT Goal Formulation: With patient Time For Goal Achievement: 05/28/20 Potential to Achieve Goals: Good ADL Goals Pt Will Perform Eating: with modified independence;sitting Pt Will Perform Grooming: with modified independence;sitting Pt Will Perform Upper Body Bathing: with min guard assist;sitting Pt Will Transfer to Toilet: with mod assist;stand pivot transfer;bedside commode Additional ADL Goal #1: pt will complete bed mobility supervision level as precursor to adls  Plan Discharge plan remains appropriate    Co-evaluation                 AM-PAC OT "6 Clicks" Daily Activity     Outcome Measure   Help from another person eating meals?: A Little Help from another person taking care of personal grooming?: A Little Help from another person toileting, which includes using toliet, bedpan, or urinal?: A Lot Help from another person bathing (including washing, rinsing, drying)?: A Lot Help from another person to put on and taking off regular upper body clothing?: A Lot Help from another person to put on and taking off regular lower body clothing?: Total 6 Click Score: 13    End of Session Equipment Utilized During Treatment: Oxygen;Gait belt  OT Visit Diagnosis:  Unsteadiness on feet (R26.81);Muscle weakness (generalized) (M62.81);Pain Pain - part of body:  (back)   Activity Tolerance Patient tolerated treatment well   Patient Left in chair;with call bell/phone within reach;with chair alarm set   Nurse Communication Mobility status        Time: 1194-1740 OT Time Calculation (min): 44 min  Charges: OT General Charges $OT Visit: 1 Visit OT Treatments $Self Care/Home Management : 38-52 mins  Lou Cal, OT Acute Rehabilitation Services Pager [phone] Office [phone]   Raymondo Band 05/16/2020, 12:40 PM

## 2020-05-17 LAB — COMPREHENSIVE METABOLIC PANEL
ALT: 17 U/L (ref 0–44)
AST: 11 U/L — ABNORMAL LOW (ref 15–41)
Albumin: 3.2 g/dL — ABNORMAL LOW (ref 3.5–5.0)
Alkaline Phosphatase: 71 U/L (ref 38–126)
Anion gap: 8 (ref 5–15)
BUN: 7 mg/dL — ABNORMAL LOW (ref 8–23)
CO2: 35 mmol/L — ABNORMAL HIGH (ref 22–32)
Calcium: 9.3 mg/dL (ref 8.9–10.3)
Chloride: 99 mmol/L (ref 98–111)
Creatinine, Ser: 0.56 mg/dL (ref 0.44–1.00)
GFR calc Af Amer: >60 mL/min (ref >60)
GFR calc non Af Amer: >60 mL/min (ref >60)
Glucose, Bld: 91 mg/dL (ref 70–99)
Potassium: 3.9 mmol/L (ref 3.5–5.1)
Sodium: 142 mmol/L (ref 135–145)
Total Bilirubin: 0.5 mg/dL (ref 0.3–1.2)
Total Protein: 5.4 g/dL — ABNORMAL LOW (ref 6.5–8.1)

## 2020-05-17 LAB — CBC
HCT: 38.1 % (ref 36.0–46.0)
Hemoglobin: 11.2 g/dL — ABNORMAL LOW (ref 12.0–15.0)
MCH: 28 pg (ref 26.0–34.0)
MCHC: 29.4 g/dL — ABNORMAL LOW (ref 30.0–36.0)
MCV: 95.3 fL (ref 80.0–100.0)
Platelets: 46 10*3/uL — ABNORMAL LOW (ref 150–400)
RBC: 4 MIL/uL (ref 3.87–5.11)
RDW: 15.8 % — ABNORMAL HIGH (ref 11.5–15.5)
WBC: 8.2 10*3/uL (ref 4.0–10.5)
nRBC: 0 % (ref 0.0–0.2)

## 2020-05-17 LAB — GLUCOSE, CAPILLARY: Glucose-Capillary: 118 mg/dL — ABNORMAL HIGH (ref 70–99)

## 2020-05-17 MED ORDER — CARVEDILOL 3.125 MG PO TABS
3.1250 mg | ORAL_TABLET | Freq: Two times a day (BID) | ORAL | Status: DC
  Administered 2020-05-17 – 2020-05-19 (×5): 3.125 mg via ORAL
  Filled 2020-05-17 (×5): qty 1

## 2020-05-17 MED ORDER — BACLOFEN 10 MG PO TABS
5.0000 mg | ORAL_TABLET | Freq: Three times a day (TID) | ORAL | Status: DC
  Administered 2020-05-17 – 2020-05-19 (×7): 5 mg via ORAL
  Filled 2020-05-17 (×7): qty 1

## 2020-05-17 NOTE — TOC Initial Note (Signed)
Transition of Care [facility]) - Initial/Assessment Note    Patient Details  Name: [name] MRN: [id number] Date of Birth: 1951-05-04  Transition of Care [facility]) CM/SW Contact:    Marilu Favre, RN Phone Number: 05/17/2020, 2:56 PM  Clinical Narrative:                 Discussed discharge plan with patient at bedside and daughter Maximino [place] [phone] via speaker phone. Currently no SNF bed offers.   Patient wants to go home. [place] in agreement. Patient lives with [place] and [place]'s boyfriend. [place] works from home. Patient has walker and bedside commode and lift recliner at home already. [name] has borrowed a Psychologist, sport and exercise from friends, they have a ramp.   Confirmed face sheet information.    PT recommendations hospital bed , hoyer lift and wheel chair . Ordered all with Zach with [facility]. They do not have any wheel chairs at present , however when they do they will deliver. [place] aware and states they can keep friends wheel chair for as long as they need.   Also ordered BiPAP through [facility]. Patient has a $222.00 /per month co pay. [employer] will provide a hardship application which [name] will complete . [name] will provide card number to [employer]. [employer] will call [facility] directly with co pays and to arrange delivery . Per Thedore Mins will [employer] it may take a day or two to get BIPAP, patient, [name] and MD aware.   At discharge patient will need PTAR transportation home. Confirmed address. Patient and daughter aware PTAR will file transportation with insurance.   Discussed home health, provided [email or web address]  [facility] had received referral for patient from SNF. Tiffany with [facility] of all of above and will follow.     Expected Discharge Plan: [place] Barriers to Discharge: Continued Medical Work up   Patient Goals and CMS Choice Patient states their goals for this hospitalization and ongoing recovery are:: to  return to home CMS Medicare.gov Compare Post Acute Care list provided to:: Patient Choice offered to / list presented to : Patient, Adult Children  Expected Discharge Plan and Services Expected Discharge Plan: [place] Choice: [place] arrangements for the past 2 months: [place]                 DME Arranged: Hospital bed, Wheelchair manual, Other see comment (hoyer lift, BIPAP) DME Agency: [employer] Date DME Agency Contacted: 05/17/20 Time DME Agency Contacted: [phone] Representative spoke with at DME Agency: Thedore Mins HH Arranged: RN, PT, OT, Nurse's Aide [place] Agency: [facility] (formerly [employer]) Date [place]: 05/17/20 Time [place]: [place] Representative spoke with at [place]: [place]  Prior Living Arrangements/Services Living arrangements for the past 2 months: [place] Lives with:: Adult Children Patient language and need for interpreter reviewed:: Yes Do you feel safe going back to the place where you live?: Yes      Need for Family Participation in Patient Care: Yes (Comment) Care giver support system in place?: Yes (comment) Current home services: DME Criminal Activity/Legal Involvement Pertinent to Current Situation/Hospitalization: No - Comment as needed  Activities of Daily Living      Permission Sought/Granted Permission sought to share information with : Facility Sport and exercise psychologist, Family Supports Permission granted to share information with : Yes, Verbal Permission Granted  Share  Information with NAME: Bluford Kaufmann [phone]  Permission granted to share info w AGENCY: [facility] granted to share info w Relationship: daughter  Permission granted to share info w Contact Information: [phone]  Emotional Assessment Appearance:: Appears stated age Attitude/Demeanor/Rapport: Engaged Affect (typically observed): Accepting Orientation:  : Oriented to Self, Oriented to Place, Oriented to  Time, Oriented to Situation Alcohol / Substance Use: Not Applicable Psych Involvement: No (comment)  Admission diagnosis:  Shock ([place]) [R57.9] AKI (acute kidney injury) ([place]) [N17.9] Altered mental status, unspecified altered mental status type [R41.82] Patient Active Problem List   Diagnosis Date Noted  . Shock ([place]) 05/14/2020  . Chronic, continuous use of opioids 05/14/2020  . Opioid overdose ([place]) 05/14/2020  . Acute on chronic respiratory failure with hypoxia and hypercapnia ([place]) 05/14/2020  . Pressure injury of skin 04/22/2020  . Sepsis due to cellulitis ([place]) 04/18/2020  . Venous stasis dermatitis of both lower extremities 02/25/2019  . Encounter for preventive care 10/20/2018  . Chronic respiratory failure with hypoxia and hypercapnia ([place]) 10/06/2018  . Polycythemia, secondary 10/05/2018  . Obesity hypoventilation syndrome ([place]) 10/04/2018  . AKI (acute kidney injury) ([place])   . BMI 40.0-44.9, adult ([place])   . Bilateral lower extremity edema   . Venous stasis dermatitis 09/30/2018  . Wound of lower extremity, bilateral 09/30/2018  . Chronic heart failure with preserved ejection fraction with moderate RV failure 06/09/2017  . GERD (gastroesophageal reflux disease) 11/13/2014  . Opiate-induced constipation 11/13/2014  . History of tobacco abuse 03/10/2014  . Chronic back pain secondary to traumatic L1 fracture 09/29/2013  . Chronic idiopathic thrombocytopenia ([place]) 08/29/2013  . HTN (hypertension)    PCP:  [name], MD Pharmacy:   [facility] [place], [place] - [address] AT [address] [place] [address] Phone: [phone] Fax: [phone]     Social Determinants of Health (SDOH) Interventions    Readmission Risk Interventions No flowsheet data found.

## 2020-05-17 NOTE — Progress Notes (Signed)
Orthopedic Tech Progress Note Patient Details:  [name] [birth year] [id number]  Ortho Devices Type of Ortho Device: Haematologist Ortho Device/Splint Location: Bilateral Ortho Device/Splint Interventions: Application, Ordered   Post Interventions Patient Tolerated: Well Instructions Provided: Care of device   Kyra A Tye 05/17/2020, 6:43 PM

## 2020-05-17 NOTE — Progress Notes (Signed)
Physical Therapy Treatment Patient Details Name: [name] MRN: [id number] DOB: May 19, 1951 Today's Date: 05/17/2020    History of Present Illness 70 yo admitted 8/29 with AMS from home after pain medication. Pt with admission 8/4-8/16 due to sepsis with LE cellutis and D/C to SNF from 8/16-8/28 then returned home. PMHx; chronic back pain with lumbar fusion and L1 fx, respiratory failure, HTN, ITP, heart failure, obesity    PT Comments    Pt progressing slowly towards her physical therapy goals. Session focused on bed level therapeutic exercises for strengthening and functional mobility. Pt requiring two person maximal assist for bed mobility, utilizing lift equipment Charlaine Dalton) for out of bed mobility. SpO2 > 90% on 3L O2. Continue to recommend SNF for ongoing Physical Therapy.      Follow Up Recommendations  SNF;Supervision/Assistance - 24 hour    Equipment Recommendations  Other (comment);Hospital bed;Wheelchair (measurements PT);Wheelchair cushion (measurements PT) (hoyer lift)    Recommendations for Other Services       Precautions / Restrictions Precautions Precautions: Fall Restrictions Weight Bearing Restrictions: No    Mobility  Bed Mobility Overal bed mobility: Needs Assistance Bed Mobility: Supine to Sit     Supine to sit: Max assist;+2 for safety/equipment;+2 for physical assistance     General bed mobility comments: assist for LE's towards edge of bed, trunk to upright  Transfers Overall transfer level: Needs assistance   Transfers: Sit to/from Stand Sit to Stand: From elevated surface;+2 safety/equipment;Min assist         General transfer comment: MinA to rise from elevated bed height to [place]. MaxA + 2 for scooting back in recliner  Ambulation/Gait                 Stairs             Wheelchair Mobility    Modified Rankin (Stroke Patients Only)       Balance Overall balance assessment: Needs assistance Sitting-balance  support: Bilateral upper extremity supported;Feet supported Sitting balance-Leahy Scale: Poor Sitting balance - Comments: reliant on bil UE support    Standing balance support: Bilateral upper extremity supported;During functional activity Standing balance-Leahy Scale: Poor Standing balance comment: reliant on BUE support                            Cognition Arousal/Alertness: Awake/alert Behavior During Therapy: Flat affect Overall Cognitive Status: Impaired/Different from baseline Area of Impairment: Attention;Following commands;Awareness;Safety/judgement;Problem solving                   Current Attention Level: Sustained   Following Commands: Follows one step commands consistently;Follows one step commands with increased time Safety/Judgement: Decreased awareness of safety;Decreased awareness of deficits Awareness: Emergent Problem Solving: Slow processing;Decreased initiation;Difficulty sequencing;Requires verbal cues;Requires tactile cues        Exercises General Exercises - Upper Extremity Shoulder Flexion: Both;10 reps;Supine Elbow Flexion: Both;10 reps;Supine General Exercises - Lower Extremity Ankle Circles/Pumps: Both;20 reps;Supine Quad Sets: Both;10 reps;Supine Heel Slides: Both;5 reps;Supine    General Comments        Pertinent Vitals/Pain Pain Assessment: Faces Faces Pain Scale: Hurts even more Pain Location: chronic back pain Pain Descriptors / Indicators: Guarding;Discomfort;Aching Pain Intervention(s): Limited activity within patient's tolerance;Monitored during session;Repositioned;Premedicated before session    Home Living                      Prior Function  PT Goals (current goals can now be found in the care plan section) Acute Rehab PT Goals Patient Stated Goal: be able to walk Potential to Achieve Goals: Fair Progress towards PT goals: Progressing toward goals    Frequency    Min 2X/week       PT Plan Current plan remains appropriate    Co-evaluation              AM-PAC PT "6 Clicks" Mobility   Outcome Measure  Help needed turning from your back to your side while in a flat bed without using bedrails?: A Lot Help needed moving from lying on your back to sitting on the side of a flat bed without using bedrails?: Total Help needed moving to and from a bed to a chair (including a wheelchair)?: Total Help needed standing up from a chair using your arms (e.g., wheelchair or bedside chair)?: Total Help needed to walk in hospital room?: Total Help needed climbing 3-5 steps with a railing? : Total 6 Click Score: 7    End of Session   Activity Tolerance: Patient tolerated treatment well Patient left: in chair;with call bell/phone within reach;with chair alarm set Nurse Communication: Mobility status;Need for lift equipment PT Visit Diagnosis: Unsteadiness on feet (R26.81);Difficulty in walking, not elsewhere classified (R26.2);Muscle weakness (generalized) (M62.81);Other abnormalities of gait and mobility (R26.89)     Time: 2841-3244 PT Time Calculation (min) (ACUTE ONLY): 30 min  Charges:  $Therapeutic Exercise: 8-22 mins $Therapeutic Activity: 8-22 mins                       Wyona Almas, PT, DPT Acute Rehabilitation Services Pager [phone] Office [phone]    Deno Etienne 05/17/2020, 1:50 PM

## 2020-05-17 NOTE — TOC Progression Note (Signed)
Transition of Care [facility]) - Progression Note    Patient Details  Name: [name] MRN: [id number] Date of Birth: [birth year]  Transition of Care [facility]) CM/SW Beaulieu, Luke Phone Number: 05/17/2020, 8:41 AM  Clinical Narrative:    CSW received verbal hand off from previous floor CSW Cherish. Pt currently with no SNF offers due to multiple barriers including: no Medicaid, current level of care needs, no COVID 19 vaccination.   TOC team per verbal hand off has encouraged pt daughter to apply for Medicaid however that usually takes 60-90 days to process application. TOC to follow.   Expected Discharge Plan: Skilled Nursing Facility Barriers to Discharge: No SNF bed, Inadequate or no insurance, Ship broker  Expected Discharge Plan and Services Expected Discharge Plan: [place]  Readmission Risk Interventions No flowsheet data found.

## 2020-05-17 NOTE — Care Management (Signed)
    Durable Medical Equipment  (From admission, onward)         Start     Ordered   05/17/20 1434  For home use only DME standard manual wheelchair with seat cushion  Once       Comments: Patient suffers from   Chronic back pain secondary to traumatic L1 fracture which impairs their ability to perform daily activities like ambulation  in the home.  A cane will not resolve issue with performing activities of daily living. A wheelchair will allow patient to safely perform daily activities. Patient can safely propel the wheelchair in the home or has a caregiver who can provide assistance. Length of need 6 months . Accessories: elevating leg rests (ELRs), wheel locks, extensions and anti-tippers.   05/17/20 1434   05/17/20 1432  For home use only DME Hospital bed  Once       Question Answer Comment  Length of Need 6 Months   Patient has (list medical condition): Chronic back pain secondary to traumatic L1 fracture   The above medical condition requires: Patient requires the ability to reposition frequently   Head must be elevated greater than: 45 degrees   Bed type Semi-electric   Hoyer Lift Yes   Support Surface: Gel Overlay      05/17/20 1432

## 2020-05-17 NOTE — Progress Notes (Signed)
Subjective:   Patient interviewed at bedside. She complains of back pain and nausea this morning. Feels that her back pain is related to spasms, but is apprehensive of muscle relaxers and opioids which she had previously. Denies SOB. Wore her BiPAP until around 3am last night, again complains that the scab on her nose hurts while wearing the mask.   Objective:  Vital signs in last 24 hours: Vitals:   05/16/20 1551 05/16/20 2126 05/17/20 0243 05/17/20 0553  BP: (!) 145/72 (!) 129/52 139/67 (!) 147/67  Pulse: 87 78 74 83  Resp: 16 15 18 16   Temp: 99 F (37.2 C) 98.8 F (37.1 C) 98.2 F (36.8 C) 98.2 F (36.8 C)  TempSrc: Oral Oral Oral Oral  SpO2: 96% 100% 97% 97%  Weight:      Height:        Physical Exam Constitutional: no acute distress Head: atraumatic ENT: external ears normal, small non-bleeding scab on top of nose Eyes: EOMI Cardiovascular: regular rate and rhythm, normal heart sounds Pulmonary: effort normal, lungs clear to ascultation bilaterally Abdominal: flat, nontender, no rebound tenderness, bowel sounds normal Musculoskeletal: bilateral legs are bandaged, paravertebral lumbar area is tender bilaterally with very tense muscles Skin: warm and dry Neurological: alert, no focal deficit Psychiatric: normal mood and affect  Assessment/Plan: [name] is a 69 y.o. female with hx of chronic hypoxic, hypercapnic respiratory failure, morbid obesity, HFpEF, chronic back pain s/p L1 fracture, chronic ITP admitted for altered mental status, hypotension, and AKI. Now oriented and mentating well and AKI resolved s/p IVF.  Active Problems:   Chronic back pain secondary to traumatic L1 fracture   Venous stasis dermatitis   BMI 40.0-44.9, adult (HCC)   AKI (acute kidney injury) ([place])   Obesity hypoventilation syndrome (HCC)   Chronic respiratory failure with hypoxia and hypercapnia (HCC)   Pressure injury of skin   Shock (HCC)   Chronic, continuous use of  opioids   Opioid overdose ([place])   Acute on chronic respiratory failure with hypoxia and hypercapnia ([place])  #Hypotension #AKI, resolved Now stable BP over the past 2 days. No further IVF administered since 8/30. Continues to be afebrile, no signs of infection, leukocytosis resolved. Kidney function back to baseline. If BP drops again, hydrate gently given hx of HFpEF. Passed SLP swallow eval but patient complains of throat pain and wanted advance diet slowly. -continue holding home amlodipine and lisinopril for now -restart carvedilol at 3.125mg  BID (1/2 home dose) -advanced diet to heart healthy, tolerated well -Daily BMP, CBC -Slow IVF if needed, goal MAP>65  #Acute encephalopathy Patient mentating well this AM, fully oriented. Original presentation most likely 2/2 baclofen, percocet use in setting of acute kidney injury. Reportedly had improvement after naloxone. Holding off opioid use for back pain for now. Patient also not using BiPAP at home. Discussed importance of BiPAP and effects of hypercapnea. -Holding centrally acting medications  #Chronic hypoxic, hypercapnic respiratory failure Patient denies SOB this AM, currently on 3L O2 (home rate). ABG on arrival 7.36 / 58 / 32. Patient reportedly not using BiPAP at home due to O2 levels being sufficient. Explained to patient the concern regarding CO2 levels as well. She is still reluctant to wear the BiPAP because thes mask hurts face and nose, especially at the location of a scab at her nose. Working on getting her home BiPAP through [facility] but monthly co-pay is prohibitive, discussed extensively with daughter. They will order the BiPAP and apply for hardship.  -BiPAP  QHS, try applying a dressing to the scab on her nose  #Chronic venous stasis dermatitis #Severe immobility No signs of infection, leukocytosis resolved, s/p Rocephin in ED. Wounds wrapped with gauze. Discussed with patient's daughter that she has much difficulty  maneuvering around the house and is not able to function independently. PT/OT eval recommending SNF. However, she is having great difficulty because Medicare will only pay for 8 days, given recent SNF stay. Patient and daughter would prefer that she go home. Social work following and helping with placement.  -PT/OT eval and treat -Appreciate TOC, CSW assistance  #Chronic back pain, knee pain She endorses worsening back and knee pain since her time at SNF, leading to increased usage in percocet, baclofen at home. Holding opioids, but starting baclofen at a lower dose because it was helpful before. Physical exam with back muscle spasms. -Voltaren gel PRN for knee pain -Lidocaine patch and warm compresses for back pain -start baclofen 5mg  TID  #Chronic ITP PLT 46,000 < 45,000. Holding DVT PPX, continue to monitor. Will give ppx once PLT>50,000.  Diet: heart healthy IVF: n/a VTE: holding due to platelets <50 Prior to Admission Living Arrangement: home Anticipated Discharge Location: SNF vs home Barriers to Discharge: medical management Dispo: Anticipated discharge in approximately 1-2 day(s).   Andrew Au, MD 05/17/2020, 6:41 AM Pager: [phone] After 5pm on weekdays and 1pm on weekends: On Call pager [phone]

## 2020-05-18 DIAGNOSIS — R578 Other shock: Secondary | ICD-10-CM

## 2020-05-18 DIAGNOSIS — T458X5A Adverse effect of other primarily systemic and hematological agents, initial encounter: Secondary | ICD-10-CM

## 2020-05-18 DIAGNOSIS — R4182 Altered mental status, unspecified: Secondary | ICD-10-CM

## 2020-05-18 DIAGNOSIS — I110 Hypertensive heart disease with heart failure: Secondary | ICD-10-CM

## 2020-05-18 DIAGNOSIS — I5030 Unspecified diastolic (congestive) heart failure: Secondary | ICD-10-CM

## 2020-05-18 LAB — CBC
HCT: 38 % (ref 36.0–46.0)
Hemoglobin: 11.7 g/dL — ABNORMAL LOW (ref 12.0–15.0)
MCH: 29.2 pg (ref 26.0–34.0)
MCHC: 30.8 g/dL (ref 30.0–36.0)
MCV: 94.8 fL (ref 80.0–100.0)
Platelets: 54 10*3/uL — ABNORMAL LOW (ref 150–400)
RBC: 4.01 MIL/uL (ref 3.87–5.11)
RDW: 15.6 % — ABNORMAL HIGH (ref 11.5–15.5)
WBC: 8 10*3/uL (ref 4.0–10.5)

## 2020-05-18 LAB — GLUCOSE, CAPILLARY: Glucose-Capillary: 95 mg/dL (ref 70–99)

## 2020-05-18 MED ORDER — CARVEDILOL 3.125 MG PO TABS
3.1250 mg | ORAL_TABLET | Freq: Two times a day (BID) | ORAL | 0 refills | Status: DC
Start: 2020-05-18 — End: 2020-10-02

## 2020-05-18 MED ORDER — LOPERAMIDE HCL 2 MG PO CAPS: 2.0000 mg | ORAL_CAPSULE | ORAL | 0 refills | Status: DC | PRN

## 2020-05-18 MED ORDER — DICLOFENAC SODIUM 1 % EX GEL: 4.0000 g | Freq: Four times a day (QID) | CUTANEOUS | 1 refills | Status: DC | PRN

## 2020-05-18 MED ORDER — ENOXAPARIN SODIUM 40 MG/0.4ML ~~LOC~~ SOLN
40.0000 mg | Freq: Every day | SUBCUTANEOUS | Status: DC
  Administered 2020-05-18: 40 mg via SUBCUTANEOUS
  Filled 2020-05-18 (×2): qty 0.4

## 2020-05-18 MED ORDER — LIDOCAINE 5 % EX PTCH: 1.0000 | MEDICATED_PATCH | Freq: Two times a day (BID) | CUTANEOUS | 1 refills | Status: AC | PRN

## 2020-05-18 MED ORDER — BACLOFEN 5 MG PO TABS: 5.0000 mg | ORAL_TABLET | Freq: Three times a day (TID) | ORAL | 1 refills | Status: AC | PRN

## 2020-05-18 MED ORDER — RAMELTEON 8 MG PO TABS
8.0000 mg | ORAL_TABLET | Freq: Every day | ORAL | 2 refills | Status: DC
Start: 2020-05-18 — End: 2020-09-30

## 2020-05-18 NOTE — Plan of Care (Signed)
  Problem: Clinical Measurements: Goal: Respiratory complications will improve Outcome: Progressing   Problem: Activity: Goal: Risk for activity intolerance will decrease Outcome: Progressing   Problem: Nutrition: Goal: Adequate nutrition will be maintained Outcome: Not Applicable   Problem: Pain Managment: Goal: General experience of comfort will improve Outcome: Progressing

## 2020-05-18 NOTE — TOC Progression Note (Signed)
Transition of Care [facility]) - Progression Note    Patient Details  Name: [name] MRN: [id number] Date of Birth: 28-Apr-1951  Transition of [facility]) CM/SW Contact  Berlin Mokry, Edson Snowball, RN Phone Number: 05/18/2020, 1:34 PM  Clinical Narrative:     Damaris Schooner with Zach with [facility] , they do have a BiPAP for patient today, they just need card number from [facility] . [facility], she just called [employer] with card number and was told someone would be calling her back with estimated delivery time.   Called Zach with [employer] to confirm awaiting call back.   Confirmed address. [place] requesting PTAR transport home once DME is delivered. Patient already has home oxygen with [place].   [name] will call her mother's nurse once DME is delivered. NCM will place PTAR paper work in chart with number for nurse to call.   Expected Discharge Plan: [place] Barriers to Discharge: Continued Medical Work up  Expected Discharge Plan and Services Expected Discharge Plan: [place] Choice: [place] arrangements for the past 2 months: Single Family Home Expected Discharge Date: 05/18/20               DME Arranged: Hospital bed, Wheelchair manual, Other see comment (hoyer lift, BIPAP) DME Agency: [employer] Date DME Agency Contacted: 05/17/20 Time DME Agency Contacted: [id number] Representative spoke with at DME Agency: [place]: RN, PT, OT, Nurse's Aide [place] Agency: [facility] (formerly [employer]) Date [place]: 05/17/20 Time [place]: 1453 Representative spoke with at [place]: [place] ([place]) Interventions    Readmission Risk Interventions No flowsheet data found.

## 2020-05-18 NOTE — Progress Notes (Signed)
Pt's 02 monitor keeps on beeping. Sats reading was 86%. Cpap is on but pt said that she can feel it's leaking undeaneth her chin. Asked if she can be placed on a Ten Sleep for awhile, sats came up to 93-94%. Will call RT to check.

## 2020-05-18 NOTE — Consult Note (Signed)
   [name] Inpatient Consult   05/18/2020  CHRISTINAMARIE TALL [birth year] [id number]   [place] [facility] [ACO] Patient:  [name] PPO   Patient screened for less than 30 day readmission  hospitalization to check if potential [place] [facility] service needs.  Review of patient's medical record  transition for post hospital care. Patient desires to return home with home health.    Primary Care Provider is [name], MD this provider is an Embedded practice and listed to provide the transition of care [TOC] for post hospital follow up.   Patient needs to be met with home health care support, currently.  For questions contact:   Natividad Brood, RN BSN [facility] Liaison  [phone] business mobile phone Toll free office [phone]  Fax number: [phone] [email or web address]

## 2020-05-18 NOTE — Progress Notes (Signed)
Subjective:   Patient states she didn't sleep well last night. She states her oxygen levels dropped in the 80's because her CPAP was leaking, and her nurse switched her to 2.5L [place] and her O2 sats improved to the 90's. She states her daughter doesn't drive and is concerned the ambulance took her clothes while she coming here. She is otherwise okay with discharge today. Denies feeling more tired with baclofen today and states it does help her back spasms.   Objective:  Vital signs in last 24 hours: Vitals:   05/17/20 1701 05/17/20 1954 05/18/20 0507 05/18/20 1357  BP: (!) 147/69 (!) 148/78 (!) 156/67 (!) 153/72  Pulse: 73 86 81 82  Resp: 16 18 18 20   Temp: 98 F (36.7 C) 98.3 F (36.8 C) 98 F (36.7 C) 98.4 F (36.9 C)  TempSrc: Oral Oral Oral Oral  SpO2: 99% 91% 94% 95%  Weight:      Height:        Physical Exam Constitutional: no acute distress Head: atraumatic ENT: external ears normal, small non-bleeding scab on top of nose Eyes: EOMI Cardiovascular: regular rate and rhythm, normal heart sounds Pulmonary: effort normal, lungs clear to ascultation bilaterally Abdominal: bowel sounds normal Musculoskeletal: bilateral legs are wrapped Skin: warm and dry Neurological: alert, no focal deficit Psychiatric: normal mood and affect  Assessment/Plan: [name] is a 69 y.o. female with hx of chronic hypoxic, hypercapnic respiratory failure, morbid obesity, HFpEF, chronic back pain s/p L1 fracture, chronic ITP admitted for altered mental status, hypotension, and AKI. Now oriented and mentating well, AKI resolved s/p IVF.  Active Problems:   Chronic idiopathic thrombocytopenia (HCC)   Chronic back pain secondary to traumatic L1 fracture   Venous stasis dermatitis   BMI 40.0-44.9, adult (HCC)   AKI (acute kidney injury) ([place])   Obesity hypoventilation syndrome (HCC)   Chronic respiratory failure with hypoxia and hypercapnia (HCC)   Pressure injury of skin   Shock (HCC)    Chronic, continuous use of opioids   Opioid overdose ([place])   Acute on chronic respiratory failure with hypoxia and hypercapnia ([place])  #Hypotension #AKI, resolved BP stable over past several days. No further IVF administered since 8/30. Continues to be afebrile, no signs of infection, leukocytosis resolved. Kidney function back to baseline. -holding home amlodipine and lisinopril  -c/w carvedilol at 3.125mg  BID (1/2 home dose) -tolerating diet -Daily BMP, CBC -Slow IVF if needed, goal MAP>65  #Chronic hypoxic, hypercapnic respiratory failure Patient denies SOB this AM, currently on 3L O2 (home rate). ABG on arrival 7.36 / 58 / 32. Patient reportedly not using BiPAP at home due to O2 levels being sufficient. Explained to patient the concern regarding CO2 levels as well.  Patient's daughter has stated she will call [employer] and has a debit card to give them, then apply for their hardship program.  -BiPAP QHS  #Chronic venous stasis dermatitis #Severe immobility No signs of infection, leukocytosis resolved, s/p Rocephin in ED. Wounds wrapped with gauze. Discussed with patient's daughter that she has much difficulty maneuvering around the house and is not able to function independently. PT/OT eval recommending SNF. However, she is having great difficulty because Medicare will only pay for 8 days, given recent SNF stay. Patient and daughter would prefer that she go home. Social work following and helping with placement.  -PT/OT eval and treat -Unna boots -Appreciate TOC, CSW assistance  #Chronic back pain, knee pain She endorses worsening back and knee pain since her time  at SNF, leading to increased usage in percocet, baclofen at home. Holding opioids, but starting baclofen at a lower dose because it was helpful before. Physical exam with back muscle spasms. -Voltaren gel PRN for knee pain -Lidocaine patch and warm compresses for back pain -c/w baclofen 5mg  TID, to discharge with same dose  prn  #Chronic ITP PLT 54,000 < 46,000. We were holding DVT prophylaxis until level reached 50,000. -start Lovenox DVT prophylaxis  #Acute encephalopathy - resolved Patient mentating well this AM, fully oriented. Original presentation most likely 2/2 baclofen, percocet use in setting of acute kidney injury. Reportedly had improvement after naloxone. Holding off opioid use for back pain for now. Patient also not using BiPAP at home. Discussed importance of BiPAP and effects of hypercapnea. -Holding centrally acting medications  Diet: heart healthy IVF: n/a VTE: Lovenox Prior to Admission Living Arrangement: home Anticipated Discharge Location: SNF vs home Barriers to Discharge: medical management Dispo: Anticipated discharge in approximately 1-2 day(s).   Andrew Au, MD 05/18/2020, 6:51 AM Pager: [phone] After 5pm on weekdays and 1pm on weekends: On Call pager [phone]

## 2020-05-18 NOTE — Plan of Care (Signed)
?  Problem: Clinical Measurements: ?Goal: Respiratory complications will improve ?Outcome: Progressing ?  ?Problem: Activity: ?Goal: Risk for activity intolerance will decrease ?Outcome: Progressing ?  ?Problem: Nutrition: ?Goal: Adequate nutrition will be maintained ?Outcome: Progressing ?  ?Problem: Coping: ?Goal: Level of anxiety will decrease ?Outcome: Progressing ?  ?

## 2020-05-19 LAB — CULTURE, BLOOD (ROUTINE X 2)
Culture: NO GROWTH
Culture: NO GROWTH
Special Requests: ADEQUATE

## 2020-05-19 LAB — CBC
HCT: 39.2 % (ref 36.0–46.0)
Hemoglobin: 11.9 g/dL — ABNORMAL LOW (ref 12.0–15.0)
MCH: 28.9 pg (ref 26.0–34.0)
MCHC: 30.4 g/dL (ref 30.0–36.0)
MCV: 95.1 fL (ref 80.0–100.0)
Platelets: 43 10*3/uL — ABNORMAL LOW (ref 150–400)
RBC: 4.12 MIL/uL (ref 3.87–5.11)
RDW: 15.6 % — ABNORMAL HIGH (ref 11.5–15.5)
WBC: 7.5 10*3/uL (ref 4.0–10.5)
nRBC: 0 % (ref 0.0–0.2)

## 2020-05-19 LAB — GLUCOSE, CAPILLARY: Glucose-Capillary: 111 mg/dL — ABNORMAL HIGH (ref 70–99)

## 2020-05-19 NOTE — Progress Notes (Signed)
   Subjective: Patient was seen and evaluated at bedside on morning rounds. No complaint.  No acute event overnight.  Objective:  Vital signs in last 24 hours: Vitals:   05/18/20 1357 05/18/20 2147 05/19/20 0407 05/19/20 0500  BP: (!) 153/72 (!) 144/85 (!) 142/70   Pulse: 82 93 88   Resp: 20 20 19    Temp: 98.4 F (36.9 C) 98.8 F (37.1 C) 98.6 F (37 C)   TempSrc: Oral Oral Oral   SpO2: 95% 93% 93%   Weight:    100.6 kg  Height:       Physical Exam Constitutional:      General: She is not in acute distress.    Appearance: She is obese. She is not ill-appearing.  Cardiovascular:     Rate and Rhythm: Normal rate and regular rhythm.  Musculoskeletal:     Comments: Has Unna boots  Neurological:     General: No focal deficit present.     Mental Status: She is oriented to person, place, and time.     Assessment/Plan:  Active Problems:   Chronic idiopathic thrombocytopenia (HCC)   Chronic back pain secondary to traumatic L1 fracture   Venous stasis dermatitis   BMI 40.0-44.9, adult (HCC)   AKI (acute kidney injury) ([place])   Obesity hypoventilation syndrome (HCC)   Chronic respiratory failure with hypoxia and hypercapnia (HCC)   Pressure injury of skin   Shock (HCC)   Chronic, continuous use of opioids   Opioid overdose (HCC)   Acute on chronic respiratory failure with hypoxia and hypercapnia (HCC)   Candance D Mangini is a 69 y.o. female with hx of chronic hypoxic, hypercapnic respiratory failure, morbid obesity, HFpEF, chronic back pain s/p L1 fracture, chronic ITP admitted for altered mental status, hypotension, and AKI. Now oriented and mentating well, AKI resolved s/p IVF.  Altered mental status Hypotension, distributive shock  Pre renal AKI  2/2 poor po intake im setting of polypharmacy and AMS Resolved. BP stable off of IVF since 8/30.  AKI resolved ith IVF. Has good PO intake.  -Clinically stable to be discharge.  -Pain med rec: Baclofen 5 mg TID PRN, DC  Thizanidin. Avoid opioid meds as much as possible.  -Holding home amlodipine and lisinopril after DC until being seen by PCP -c/w carvedilol at 3.125mg  BID (1/2 home dose) -f/u w PCP within 1 week (send message to [facility] front desk)  Chronic hypoxic, hypercapnic respiratory failure: 2/2 OSA and probable OHS.  Had not received her BiPAP after recent hospitalization.  She is on O2 at home.  -Advised pt to use BiPAP at night at home. Case manager is working on providing BiPAP for home use  Anticipating BiPAP delivered to her house today.   Obesity, immobility Chronic pain: -PT OT recommended SNF but placement was unsuccessful due to recent SNF placement and as pt will have to pay [place] for stay >8 days -DC home with [facility] and home equipment. Appreciate TOC assistance  -Pain regimen as above -Unna boots  #Chronic ITP PLT 54,000 < 46,000. We were holding DVT prophylaxis until level reached 50,000. -start Lovenox DVT prophylaxis -f/u outpt  Prior to Admission Living Arrangement: Anticipated Discharge Location: Home  Barriers to Discharge: Clinically ready to be discharged. Dispo: Anticipated discharge today  Dewayne Hatch, MD 05/19/2020, 7:29 AM Pager: [phone] After 5pm on weekdays and 1pm on weekends: On Call pager [phone]

## 2020-05-19 NOTE — Plan of Care (Signed)
  Problem: Education: Goal: Knowledge of General Education information will improve Description: Including pain rating scale, medication(s)/side effects and non-pharmacologic comfort measures 05/19/2020 1007 by Everlean Patterson, RN Outcome: Adequate for Discharge 05/19/2020 1007 by Everlean Patterson, RN Outcome: Adequate for Discharge   Problem: Health Behavior/Discharge Planning: Goal: Ability to manage health-related needs will improve 05/19/2020 1007 by Everlean Patterson, RN Outcome: Adequate for Discharge 05/19/2020 1007 by Everlean Patterson, RN Outcome: Adequate for Discharge   Problem: Clinical Measurements: Goal: Ability to maintain clinical measurements within normal limits will improve 05/19/2020 1007 by Everlean Patterson, RN Outcome: Adequate for Discharge 05/19/2020 1007 by Everlean Patterson, RN Outcome: Adequate for Discharge Goal: Will remain free from infection 05/19/2020 1007 by Everlean Patterson, RN Outcome: Adequate for Discharge 05/19/2020 1007 by Everlean Patterson, RN Outcome: Adequate for Discharge Goal: Diagnostic test results will improve 05/19/2020 1007 by Everlean Patterson, RN Outcome: Adequate for Discharge 05/19/2020 1007 by Everlean Patterson, RN Outcome: Adequate for Discharge Goal: Respiratory complications will improve 05/19/2020 1007 by Everlean Patterson, RN Outcome: Adequate for Discharge 05/19/2020 1007 by Everlean Patterson, RN Outcome: Adequate for Discharge Goal: Cardiovascular complication will be avoided 05/19/2020 1007 by Everlean Patterson, RN Outcome: Adequate for Discharge 05/19/2020 1007 by Everlean Patterson, RN Outcome: Adequate for Discharge   Problem: Activity: Goal: Risk for activity intolerance will decrease 05/19/2020 1007 by Everlean Patterson, RN Outcome: Adequate for Discharge 05/19/2020 1007 by Everlean Patterson, RN Outcome: Adequate for Discharge   Problem: Coping: Goal: Level of anxiety will decrease 05/19/2020 1007 by Everlean Patterson, RN Outcome: Adequate for Discharge 05/19/2020 1007 by Everlean Patterson, RN Outcome: Adequate for Discharge   Problem: Elimination: Goal: Will not experience complications related to bowel motility 05/19/2020 1007 by Everlean Patterson, RN Outcome: Adequate for Discharge 05/19/2020 1007 by Everlean Patterson, RN Outcome: Adequate for Discharge Goal: Will not experience complications related to urinary retention 05/19/2020 1007 by Everlean Patterson, RN Outcome: Adequate for Discharge 05/19/2020 1007 by Everlean Patterson, RN Outcome: Adequate for Discharge   Problem: Pain Managment: Goal: General experience of comfort will improve 05/19/2020 1007 by Everlean Patterson, RN Outcome: Adequate for Discharge 05/19/2020 1007 by Everlean Patterson, RN Outcome: Adequate for Discharge   Problem: Safety: Goal: Ability to remain free from injury will improve 05/19/2020 1007 by Everlean Patterson, RN Outcome: Adequate for Discharge 05/19/2020 1007 by Everlean Patterson, RN Outcome: Adequate for Discharge   Problem: Skin Integrity: Goal: Risk for impaired skin integrity will decrease 05/19/2020 1007 by Everlean Patterson, RN Outcome: Adequate for Discharge 05/19/2020 1007 by Everlean Patterson, RN Outcome: Adequate for Discharge

## 2020-05-19 NOTE — Progress Notes (Signed)
  Date: 05/19/2020  Patient name: [name]  Medical record number: [id number]  Date of birth: 1951/06/09   This patient's plan of care was discussed with the house staff. Please see Dr. Darcey Nora note for complete details. I concur with her findings.   Sid Falcon, MD 05/19/2020, 9:51 PM

## 2020-05-19 NOTE — TOC Transition Note (Signed)
Transition of Care [facility]) - CM/SW Discharge Note   Patient Details  Name: [name] MRN: [id number] Date of Birth: [birth year]  Transition of Care [facility]) CM/SW Contact:  Sharin Mons, RN Phone Number: 05/19/2020, 1:31 PM   Clinical Narrative:     Patient will DC to: Home Anticipated DC date: 05/19/2020 Family notified: [name] ( daughter) Transport by: Corey Harold   Per MD patient ready for DC today . RN, patient, and patient's family notified of DC. Ambulance transport requested for patient. DME ( hospital bed, W/C) already delivered to home. Per [employer] Bipap will be delivered to pt's home today after pt's arrival to home.  RNCM will sign off for now as intervention is no longer needed. Please consult [place] again if new needs arise.   Final next level of care: [place] Barriers to Discharge: No Barriers Identified   Patient Goals and CMS Choice Patient states their goals for this hospitalization and ongoing recovery are:: to return to home CMS Medicare.gov Compare Post Acute Care list provided to:: Patient Choice offered to / list presented to : Patient, Adult Children  Discharge Placement                       Discharge Plan and Services     Post Acute Care Choice: Home Health          DME Arranged: Hospital bed, Wheelchair manual, Other see comment (hoyer lift, BIPAP) DME Agency: [employer] Date DME Agency Contacted: 05/17/20 Time DME Agency Contacted: [id number] Representative spoke with at DME Agency: [place]: RN, PT, OT, Nurse's Aide [place] Agency: [facility] (formerly [employer]) Date [place]: 05/17/20 Time [place]: [place] Representative spoke with at [place]: [place] ([place]) Interventions     Readmission Risk Interventions No flowsheet data found.

## 2020-05-20 DIAGNOSIS — I872 Venous insufficiency (chronic) (peripheral): Secondary | ICD-10-CM | POA: Diagnosis not present

## 2020-05-20 DIAGNOSIS — L97919 Non-pressure chronic ulcer of unspecified part of right lower leg with unspecified severity: Secondary | ICD-10-CM | POA: Diagnosis not present

## 2020-05-20 DIAGNOSIS — L89321 Pressure ulcer of left buttock, stage 1: Secondary | ICD-10-CM | POA: Diagnosis not present

## 2020-05-20 DIAGNOSIS — I130 Hypertensive heart and chronic kidney disease with heart failure and stage 1 through stage 4 chronic kidney disease, or unspecified chronic kidney disease: Secondary | ICD-10-CM | POA: Diagnosis not present

## 2020-05-20 DIAGNOSIS — N189 Chronic kidney disease, unspecified: Secondary | ICD-10-CM | POA: Diagnosis not present

## 2020-05-20 DIAGNOSIS — L0390 Cellulitis, unspecified: Secondary | ICD-10-CM | POA: Diagnosis not present

## 2020-05-20 DIAGNOSIS — D693 Immune thrombocytopenic purpura: Secondary | ICD-10-CM | POA: Diagnosis not present

## 2020-05-20 DIAGNOSIS — L97929 Non-pressure chronic ulcer of unspecified part of left lower leg with unspecified severity: Secondary | ICD-10-CM | POA: Diagnosis not present

## 2020-05-20 DIAGNOSIS — I5033 Acute on chronic diastolic (congestive) heart failure: Secondary | ICD-10-CM | POA: Diagnosis not present

## 2020-05-22 ENCOUNTER — Telehealth: Payer: Self-pay | Admitting: *Deleted

## 2020-05-22 ENCOUNTER — Telehealth: Payer: Self-pay | Admitting: Student

## 2020-05-22 ENCOUNTER — Other Ambulatory Visit: Payer: Self-pay

## 2020-05-22 NOTE — Telephone Encounter (Addendum)
Information was sent through CoverMyMeds for PA for Lidocaine Patches 5%.  Awaiting determination within 3-7 days.  Sander Nephew, RN 05/22/2020 3:55 PM.  [name] from [facility] Lidocaine 5% patches was approved until 09/14/2020.  Sander Nephew, RN 05/24/2020 1:21  PM.

## 2020-05-22 NOTE — Telephone Encounter (Signed)
Pls contact Estill Bamberg w/ [facility] VO [phone]

## 2020-05-22 NOTE — Telephone Encounter (Signed)
Return call to [facility], [facility] - stated pt recent discharge to home, wound on leg. Stated she came home w/compression wrap but had rolled down which caused swelling. Requesting verbal order for "Silver alginate with kerlix wraps twice a week". VO given - if not appropriate, let me know.

## 2020-05-22 NOTE — Patient Outreach (Signed)
[place] [facility]) Care Management  05/22/2020  [name] 06/25/1951 [id number]    EMMI-General Discharge RED ON EMMI ALERT Day # 1 Date: 05/21/2020 Red Alert Reason: "Scheduled follow -up? No"   Outreach attempt #1 to patient. No answer after multiple rings and unable to leave message.          Plan: RN CM will make outreach attempt to patient within 3-4 business days. RN CM will send unsuccessful outreach letter to patient.  Enzo Montgomery, RN,BSN,CCM [place] Management Telephonic Care Management Coordinator Direct Phone: [phone] Toll Free: [phone] Fax: [phone]

## 2020-05-22 NOTE — Telephone Encounter (Signed)
Agree. Thanks

## 2020-05-22 NOTE — Telephone Encounter (Addendum)
Information was sent through CoverMyMeds for PA for [place].  Awaiting decision Sander Nephew, RN 05/22/2020 3:33 PM. PA for Ramelteon was denied. Patient will need to try and fail Belsomra or Trazodone if appropriate.  Message to be sent to [facility] team to consider a change if appropriate.  Sander Nephew, RN 05/25/2020 9:12 AM.

## 2020-05-23 ENCOUNTER — Other Ambulatory Visit: Payer: Self-pay

## 2020-05-23 NOTE — Patient Outreach (Signed)
[name] [facility]) Care Management  05/23/2020  [name] 1951-03-31 [id number]   EMMI-General Discharge RED ON EMMI ALERT Day # 1 Date: 05/21/2020 Red Alert Reason: "Scheduled follow -up? No"   Outreach attempt #2 to patient. No answer after multiple rings and unable to leave message.    Plan: RN CM will make outreach attempt to patient within 3-4 business days.  Enzo Montgomery, RN,BSN,CCM [place] Management Telephonic Care Management Coordinator Direct Phone: [phone] Toll Free: [phone] Fax: [phone]

## 2020-05-24 ENCOUNTER — Other Ambulatory Visit: Payer: Self-pay

## 2020-05-24 NOTE — Patient Outreach (Signed)
[place] [facility]) Care Management  05/24/2020  MYLI PAE 1950-10-12 [id number]    EMMI-General Discharge RED ON EMMI ALERT Day #1 Date:05/21/2020 Red Alert Reason:"Scheduled follow -up? No"   Outreach attempt #3 to patient. No answer at present.      Plan: RN CM will make outreach attempt to patient within 3 weeks if no response from letter mailed to patient.   Enzo Montgomery, RN,BSN,CCM [place] Management Telephonic Care Management Coordinator Direct Phone: [phone] Toll Free: [phone] Fax: [phone]

## 2020-05-25 ENCOUNTER — Other Ambulatory Visit: Payer: Self-pay

## 2020-05-25 DIAGNOSIS — I130 Hypertensive heart and chronic kidney disease with heart failure and stage 1 through stage 4 chronic kidney disease, or unspecified chronic kidney disease: Secondary | ICD-10-CM | POA: Diagnosis not present

## 2020-05-25 DIAGNOSIS — L89321 Pressure ulcer of left buttock, stage 1: Secondary | ICD-10-CM | POA: Diagnosis not present

## 2020-05-25 DIAGNOSIS — I5033 Acute on chronic diastolic (congestive) heart failure: Secondary | ICD-10-CM | POA: Diagnosis not present

## 2020-05-25 DIAGNOSIS — D693 Immune thrombocytopenic purpura: Secondary | ICD-10-CM | POA: Diagnosis not present

## 2020-05-25 DIAGNOSIS — N189 Chronic kidney disease, unspecified: Secondary | ICD-10-CM | POA: Diagnosis not present

## 2020-05-25 DIAGNOSIS — L97929 Non-pressure chronic ulcer of unspecified part of left lower leg with unspecified severity: Secondary | ICD-10-CM | POA: Diagnosis not present

## 2020-05-25 DIAGNOSIS — L0390 Cellulitis, unspecified: Secondary | ICD-10-CM | POA: Diagnosis not present

## 2020-05-25 DIAGNOSIS — I872 Venous insufficiency (chronic) (peripheral): Secondary | ICD-10-CM | POA: Diagnosis not present

## 2020-05-25 DIAGNOSIS — L97919 Non-pressure chronic ulcer of unspecified part of right lower leg with unspecified severity: Secondary | ICD-10-CM | POA: Diagnosis not present

## 2020-05-25 NOTE — Patient Outreach (Signed)
[place] [facility]) Care Management  05/25/2020  TYWANA ROBOTHAM [birth year] [id number]   EMMI-General Discharge RED ON EMMI ALERT Day #1 Date:05/21/2020 Red Alert Reason:"Scheduled follow -up? No"   EMMI-General Discharge RED ON EMMI ALERT Day # 4 Date: 05/24/20 Red Alert Reason: "Scheduled follow-up? No"   Unsuccessful outreach attempt to patient.    Plan:  RN CM will make outreach attempt to patient within 3 weeks if no response from letter mailed to patient.   Enzo Montgomery, RN,BSN,CCM [place] Management Telephonic Care Management Coordinator Direct Phone: [phone] Toll Free: [phone] Fax: [phone]

## 2020-05-29 ENCOUNTER — Telehealth: Payer: Self-pay | Admitting: Student

## 2020-05-29 DIAGNOSIS — I130 Hypertensive heart and chronic kidney disease with heart failure and stage 1 through stage 4 chronic kidney disease, or unspecified chronic kidney disease: Secondary | ICD-10-CM | POA: Diagnosis not present

## 2020-05-29 DIAGNOSIS — L97919 Non-pressure chronic ulcer of unspecified part of right lower leg with unspecified severity: Secondary | ICD-10-CM | POA: Diagnosis not present

## 2020-05-29 DIAGNOSIS — L89321 Pressure ulcer of left buttock, stage 1: Secondary | ICD-10-CM | POA: Diagnosis not present

## 2020-05-29 DIAGNOSIS — I872 Venous insufficiency (chronic) (peripheral): Secondary | ICD-10-CM | POA: Diagnosis not present

## 2020-05-29 DIAGNOSIS — L97929 Non-pressure chronic ulcer of unspecified part of left lower leg with unspecified severity: Secondary | ICD-10-CM | POA: Diagnosis not present

## 2020-05-29 DIAGNOSIS — N189 Chronic kidney disease, unspecified: Secondary | ICD-10-CM | POA: Diagnosis not present

## 2020-05-29 DIAGNOSIS — I5033 Acute on chronic diastolic (congestive) heart failure: Secondary | ICD-10-CM | POA: Diagnosis not present

## 2020-05-29 DIAGNOSIS — L0390 Cellulitis, unspecified: Secondary | ICD-10-CM | POA: Diagnosis not present

## 2020-05-29 DIAGNOSIS — D693 Immune thrombocytopenic purpura: Secondary | ICD-10-CM | POA: Diagnosis not present

## 2020-05-29 NOTE — Telephone Encounter (Signed)
Pls contact Verdis Frederickson from [place] @ [facility] [phone] need VO

## 2020-05-29 NOTE — Telephone Encounter (Signed)
Returned call to [place], [place] with [facility]. Verbal auth given to continue [facility] PT 1 week 9 to work on strengthening, transfers, balance, and gait training. Will route to Yellow Team for agreement/denial. Hubbard Hartshorn, RN, BSN

## 2020-05-30 DIAGNOSIS — I130 Hypertensive heart and chronic kidney disease with heart failure and stage 1 through stage 4 chronic kidney disease, or unspecified chronic kidney disease: Secondary | ICD-10-CM | POA: Diagnosis not present

## 2020-05-30 DIAGNOSIS — D693 Immune thrombocytopenic purpura: Secondary | ICD-10-CM | POA: Diagnosis not present

## 2020-05-30 DIAGNOSIS — N189 Chronic kidney disease, unspecified: Secondary | ICD-10-CM | POA: Diagnosis not present

## 2020-05-30 DIAGNOSIS — I5033 Acute on chronic diastolic (congestive) heart failure: Secondary | ICD-10-CM | POA: Diagnosis not present

## 2020-05-30 DIAGNOSIS — L89321 Pressure ulcer of left buttock, stage 1: Secondary | ICD-10-CM | POA: Diagnosis not present

## 2020-05-30 DIAGNOSIS — L97929 Non-pressure chronic ulcer of unspecified part of left lower leg with unspecified severity: Secondary | ICD-10-CM | POA: Diagnosis not present

## 2020-05-30 DIAGNOSIS — L97919 Non-pressure chronic ulcer of unspecified part of right lower leg with unspecified severity: Secondary | ICD-10-CM | POA: Diagnosis not present

## 2020-05-30 DIAGNOSIS — I872 Venous insufficiency (chronic) (peripheral): Secondary | ICD-10-CM | POA: Diagnosis not present

## 2020-05-30 DIAGNOSIS — L0390 Cellulitis, unspecified: Secondary | ICD-10-CM | POA: Diagnosis not present

## 2020-05-31 DIAGNOSIS — L97929 Non-pressure chronic ulcer of unspecified part of left lower leg with unspecified severity: Secondary | ICD-10-CM | POA: Diagnosis not present

## 2020-05-31 DIAGNOSIS — I130 Hypertensive heart and chronic kidney disease with heart failure and stage 1 through stage 4 chronic kidney disease, or unspecified chronic kidney disease: Secondary | ICD-10-CM | POA: Diagnosis not present

## 2020-05-31 DIAGNOSIS — I872 Venous insufficiency (chronic) (peripheral): Secondary | ICD-10-CM | POA: Diagnosis not present

## 2020-05-31 DIAGNOSIS — I5033 Acute on chronic diastolic (congestive) heart failure: Secondary | ICD-10-CM | POA: Diagnosis not present

## 2020-05-31 DIAGNOSIS — N189 Chronic kidney disease, unspecified: Secondary | ICD-10-CM | POA: Diagnosis not present

## 2020-05-31 DIAGNOSIS — D693 Immune thrombocytopenic purpura: Secondary | ICD-10-CM | POA: Diagnosis not present

## 2020-05-31 DIAGNOSIS — L0390 Cellulitis, unspecified: Secondary | ICD-10-CM | POA: Diagnosis not present

## 2020-05-31 DIAGNOSIS — L89321 Pressure ulcer of left buttock, stage 1: Secondary | ICD-10-CM | POA: Diagnosis not present

## 2020-05-31 DIAGNOSIS — L97919 Non-pressure chronic ulcer of unspecified part of right lower leg with unspecified severity: Secondary | ICD-10-CM | POA: Diagnosis not present

## 2020-06-01 ENCOUNTER — Encounter: Payer: Medicare PPO | Admitting: Student

## 2020-06-01 DIAGNOSIS — L89321 Pressure ulcer of left buttock, stage 1: Secondary | ICD-10-CM | POA: Diagnosis not present

## 2020-06-01 DIAGNOSIS — I872 Venous insufficiency (chronic) (peripheral): Secondary | ICD-10-CM | POA: Diagnosis not present

## 2020-06-01 DIAGNOSIS — L97929 Non-pressure chronic ulcer of unspecified part of left lower leg with unspecified severity: Secondary | ICD-10-CM | POA: Diagnosis not present

## 2020-06-01 DIAGNOSIS — L0390 Cellulitis, unspecified: Secondary | ICD-10-CM | POA: Diagnosis not present

## 2020-06-01 DIAGNOSIS — N189 Chronic kidney disease, unspecified: Secondary | ICD-10-CM | POA: Diagnosis not present

## 2020-06-01 DIAGNOSIS — I130 Hypertensive heart and chronic kidney disease with heart failure and stage 1 through stage 4 chronic kidney disease, or unspecified chronic kidney disease: Secondary | ICD-10-CM | POA: Diagnosis not present

## 2020-06-01 DIAGNOSIS — L97919 Non-pressure chronic ulcer of unspecified part of right lower leg with unspecified severity: Secondary | ICD-10-CM | POA: Diagnosis not present

## 2020-06-01 DIAGNOSIS — I5033 Acute on chronic diastolic (congestive) heart failure: Secondary | ICD-10-CM | POA: Diagnosis not present

## 2020-06-01 DIAGNOSIS — D693 Immune thrombocytopenic purpura: Secondary | ICD-10-CM | POA: Diagnosis not present

## 2020-06-04 NOTE — Telephone Encounter (Signed)
She can try over the counter melatonin 5mg  nightly.

## 2020-06-05 DIAGNOSIS — L97919 Non-pressure chronic ulcer of unspecified part of right lower leg with unspecified severity: Secondary | ICD-10-CM | POA: Diagnosis not present

## 2020-06-05 DIAGNOSIS — N189 Chronic kidney disease, unspecified: Secondary | ICD-10-CM | POA: Diagnosis not present

## 2020-06-05 DIAGNOSIS — I5033 Acute on chronic diastolic (congestive) heart failure: Secondary | ICD-10-CM | POA: Diagnosis not present

## 2020-06-05 DIAGNOSIS — L97929 Non-pressure chronic ulcer of unspecified part of left lower leg with unspecified severity: Secondary | ICD-10-CM | POA: Diagnosis not present

## 2020-06-05 DIAGNOSIS — L89321 Pressure ulcer of left buttock, stage 1: Secondary | ICD-10-CM | POA: Diagnosis not present

## 2020-06-05 DIAGNOSIS — L0390 Cellulitis, unspecified: Secondary | ICD-10-CM | POA: Diagnosis not present

## 2020-06-05 DIAGNOSIS — D693 Immune thrombocytopenic purpura: Secondary | ICD-10-CM | POA: Diagnosis not present

## 2020-06-05 DIAGNOSIS — I130 Hypertensive heart and chronic kidney disease with heart failure and stage 1 through stage 4 chronic kidney disease, or unspecified chronic kidney disease: Secondary | ICD-10-CM | POA: Diagnosis not present

## 2020-06-05 DIAGNOSIS — I872 Venous insufficiency (chronic) (peripheral): Secondary | ICD-10-CM | POA: Diagnosis not present

## 2020-06-08 DIAGNOSIS — I872 Venous insufficiency (chronic) (peripheral): Secondary | ICD-10-CM | POA: Diagnosis not present

## 2020-06-08 DIAGNOSIS — L97919 Non-pressure chronic ulcer of unspecified part of right lower leg with unspecified severity: Secondary | ICD-10-CM | POA: Diagnosis not present

## 2020-06-08 DIAGNOSIS — L89321 Pressure ulcer of left buttock, stage 1: Secondary | ICD-10-CM | POA: Diagnosis not present

## 2020-06-08 DIAGNOSIS — I130 Hypertensive heart and chronic kidney disease with heart failure and stage 1 through stage 4 chronic kidney disease, or unspecified chronic kidney disease: Secondary | ICD-10-CM | POA: Diagnosis not present

## 2020-06-08 DIAGNOSIS — D693 Immune thrombocytopenic purpura: Secondary | ICD-10-CM | POA: Diagnosis not present

## 2020-06-08 DIAGNOSIS — N189 Chronic kidney disease, unspecified: Secondary | ICD-10-CM | POA: Diagnosis not present

## 2020-06-08 DIAGNOSIS — I5033 Acute on chronic diastolic (congestive) heart failure: Secondary | ICD-10-CM | POA: Diagnosis not present

## 2020-06-08 DIAGNOSIS — L0390 Cellulitis, unspecified: Secondary | ICD-10-CM | POA: Diagnosis not present

## 2020-06-08 DIAGNOSIS — L97929 Non-pressure chronic ulcer of unspecified part of left lower leg with unspecified severity: Secondary | ICD-10-CM | POA: Diagnosis not present

## 2020-06-11 DIAGNOSIS — L97929 Non-pressure chronic ulcer of unspecified part of left lower leg with unspecified severity: Secondary | ICD-10-CM | POA: Diagnosis not present

## 2020-06-11 DIAGNOSIS — L89321 Pressure ulcer of left buttock, stage 1: Secondary | ICD-10-CM | POA: Diagnosis not present

## 2020-06-11 DIAGNOSIS — D693 Immune thrombocytopenic purpura: Secondary | ICD-10-CM | POA: Diagnosis not present

## 2020-06-11 DIAGNOSIS — I130 Hypertensive heart and chronic kidney disease with heart failure and stage 1 through stage 4 chronic kidney disease, or unspecified chronic kidney disease: Secondary | ICD-10-CM | POA: Diagnosis not present

## 2020-06-11 DIAGNOSIS — N189 Chronic kidney disease, unspecified: Secondary | ICD-10-CM | POA: Diagnosis not present

## 2020-06-11 DIAGNOSIS — I872 Venous insufficiency (chronic) (peripheral): Secondary | ICD-10-CM | POA: Diagnosis not present

## 2020-06-11 DIAGNOSIS — L97919 Non-pressure chronic ulcer of unspecified part of right lower leg with unspecified severity: Secondary | ICD-10-CM | POA: Diagnosis not present

## 2020-06-11 DIAGNOSIS — L0390 Cellulitis, unspecified: Secondary | ICD-10-CM | POA: Diagnosis not present

## 2020-06-11 DIAGNOSIS — I5033 Acute on chronic diastolic (congestive) heart failure: Secondary | ICD-10-CM | POA: Diagnosis not present

## 2020-06-12 ENCOUNTER — Telehealth: Payer: Self-pay | Admitting: *Deleted

## 2020-06-12 ENCOUNTER — Encounter: Payer: Medicare PPO | Admitting: Internal Medicine

## 2020-06-12 DIAGNOSIS — L89321 Pressure ulcer of left buttock, stage 1: Secondary | ICD-10-CM | POA: Diagnosis not present

## 2020-06-12 DIAGNOSIS — I130 Hypertensive heart and chronic kidney disease with heart failure and stage 1 through stage 4 chronic kidney disease, or unspecified chronic kidney disease: Secondary | ICD-10-CM | POA: Diagnosis not present

## 2020-06-12 DIAGNOSIS — L97929 Non-pressure chronic ulcer of unspecified part of left lower leg with unspecified severity: Secondary | ICD-10-CM | POA: Diagnosis not present

## 2020-06-12 DIAGNOSIS — L0390 Cellulitis, unspecified: Secondary | ICD-10-CM | POA: Diagnosis not present

## 2020-06-12 DIAGNOSIS — N189 Chronic kidney disease, unspecified: Secondary | ICD-10-CM | POA: Diagnosis not present

## 2020-06-12 DIAGNOSIS — I5033 Acute on chronic diastolic (congestive) heart failure: Secondary | ICD-10-CM | POA: Diagnosis not present

## 2020-06-12 DIAGNOSIS — I872 Venous insufficiency (chronic) (peripheral): Secondary | ICD-10-CM | POA: Diagnosis not present

## 2020-06-12 DIAGNOSIS — D693 Immune thrombocytopenic purpura: Secondary | ICD-10-CM | POA: Diagnosis not present

## 2020-06-12 DIAGNOSIS — L97919 Non-pressure chronic ulcer of unspecified part of right lower leg with unspecified severity: Secondary | ICD-10-CM | POA: Diagnosis not present

## 2020-06-12 NOTE — Telephone Encounter (Signed)
Attempted call to Tonya, left VM.

## 2020-06-12 NOTE — Telephone Encounter (Signed)
Received call from [place], [place] with [facility]. States she visited patient for first time today. Was told patient had one wound on LLE but patient actually has multiple wounds on both LEs that "look terrible." Some look worse than others, some are covered with slough. Feels patient would benefit from higher level of care such as the [place]. Patient would not let Tonya speak to her about the severity of the wounds; told Tonya, "just dress them." Tonya dressed wounds with calcium alginate. An RN will continue seeing patient twice weekly till Jul 18, 2020 but is unsure how helpful this will be. Kenney Houseman may be reached at [phone]. If there are any additional orders, please fax to Tamsen Meek at [place] at [phone]. Hubbard Hartshorn, BSN, RN-BC

## 2020-06-13 ENCOUNTER — Other Ambulatory Visit: Payer: Self-pay

## 2020-06-13 ENCOUNTER — Other Ambulatory Visit: Payer: Self-pay | Admitting: Student

## 2020-06-13 DIAGNOSIS — S81809D Unspecified open wound, unspecified lower leg, subsequent encounter: Secondary | ICD-10-CM

## 2020-06-13 DIAGNOSIS — L97929 Non-pressure chronic ulcer of unspecified part of left lower leg with unspecified severity: Secondary | ICD-10-CM | POA: Diagnosis not present

## 2020-06-13 DIAGNOSIS — D693 Immune thrombocytopenic purpura: Secondary | ICD-10-CM | POA: Diagnosis not present

## 2020-06-13 DIAGNOSIS — L97919 Non-pressure chronic ulcer of unspecified part of right lower leg with unspecified severity: Secondary | ICD-10-CM | POA: Diagnosis not present

## 2020-06-13 DIAGNOSIS — I872 Venous insufficiency (chronic) (peripheral): Secondary | ICD-10-CM | POA: Diagnosis not present

## 2020-06-13 DIAGNOSIS — N189 Chronic kidney disease, unspecified: Secondary | ICD-10-CM | POA: Diagnosis not present

## 2020-06-13 DIAGNOSIS — I130 Hypertensive heart and chronic kidney disease with heart failure and stage 1 through stage 4 chronic kidney disease, or unspecified chronic kidney disease: Secondary | ICD-10-CM | POA: Diagnosis not present

## 2020-06-13 DIAGNOSIS — L0390 Cellulitis, unspecified: Secondary | ICD-10-CM | POA: Diagnosis not present

## 2020-06-13 DIAGNOSIS — L89321 Pressure ulcer of left buttock, stage 1: Secondary | ICD-10-CM | POA: Diagnosis not present

## 2020-06-13 DIAGNOSIS — I5033 Acute on chronic diastolic (congestive) heart failure: Secondary | ICD-10-CM | POA: Diagnosis not present

## 2020-06-13 NOTE — Progress Notes (Signed)
I called and spoke to [facility], who comes to the patient's house to take care for her wounds.  Kenney Houseman states that patient has multiple wounds of bilateral lower extremities, and some of them are sloughing off.  She is concerned that patient will need more than wound care at home due to risk of infection.  A referral to wound care center is recommended.  I called and spoke to Ms. Deamer and her daughter [name].  I explained to them the severity of her wounds and the risk of infection and hospitalization associated with it.  I recommended a referral to wound care center.  Patient initially declined to go and asked for antibiotics.  However her daughter stated that she and her fianc will try to accommodate the transportation to take patient to the wound center.  They are advised to call us back for any questions or concerns.

## 2020-06-13 NOTE — Telephone Encounter (Signed)
Kenney Houseman was returning call to Dr. Collene Gobble. She may be reached at [phone] till 2 PM or after 3 PM today. Hubbard Hartshorn, BSN, RN-BC

## 2020-06-13 NOTE — Telephone Encounter (Signed)
I called [name] and she recommended a referral to wound care center.  I spoke to patient and her daughter, they will try to accommodate the transportation to wound care center.  Ambulatory referral to wound center placed.  Thank you.

## 2020-06-13 NOTE — Patient Outreach (Signed)
[place] [facility] Care Management  06/13/2020  NICHOLAS OSSA Jan 24, 1951 [id number]   EMMI-General Discharge RED ON EMMI ALERT Day #1 Date:05/21/2020 Red Alert Reason:"Scheduled follow -up? No"   Multiple attempts to establish contact with patient without success. No response from letter mailed to patient. Case is being closed at this time.    Plan: RN CM will close case at this time.  Enzo Montgomery, RN,BSN,CCM [place] Management Telephonic Care Management Coordinator Direct Phone: [phone] Toll Free: [phone] Fax: [phone]

## 2020-06-13 NOTE — Telephone Encounter (Signed)
Tonya with [facility] requesting to speak with a nurse, please call back.

## 2020-06-14 ENCOUNTER — Ambulatory Visit: Payer: Self-pay

## 2020-06-14 ENCOUNTER — Other Ambulatory Visit: Payer: Self-pay | Admitting: Internal Medicine

## 2020-06-14 ENCOUNTER — Telehealth: Payer: Self-pay | Admitting: *Deleted

## 2020-06-14 DIAGNOSIS — N189 Chronic kidney disease, unspecified: Secondary | ICD-10-CM | POA: Diagnosis not present

## 2020-06-14 DIAGNOSIS — L97929 Non-pressure chronic ulcer of unspecified part of left lower leg with unspecified severity: Secondary | ICD-10-CM | POA: Diagnosis not present

## 2020-06-14 DIAGNOSIS — I872 Venous insufficiency (chronic) (peripheral): Secondary | ICD-10-CM | POA: Diagnosis not present

## 2020-06-14 DIAGNOSIS — I130 Hypertensive heart and chronic kidney disease with heart failure and stage 1 through stage 4 chronic kidney disease, or unspecified chronic kidney disease: Secondary | ICD-10-CM | POA: Diagnosis not present

## 2020-06-14 DIAGNOSIS — L0390 Cellulitis, unspecified: Secondary | ICD-10-CM | POA: Diagnosis not present

## 2020-06-14 DIAGNOSIS — I5033 Acute on chronic diastolic (congestive) heart failure: Secondary | ICD-10-CM | POA: Diagnosis not present

## 2020-06-14 DIAGNOSIS — L89321 Pressure ulcer of left buttock, stage 1: Secondary | ICD-10-CM | POA: Diagnosis not present

## 2020-06-14 DIAGNOSIS — R627 Adult failure to thrive: Secondary | ICD-10-CM

## 2020-06-14 DIAGNOSIS — D693 Immune thrombocytopenic purpura: Secondary | ICD-10-CM | POA: Diagnosis not present

## 2020-06-14 DIAGNOSIS — L97919 Non-pressure chronic ulcer of unspecified part of right lower leg with unspecified severity: Secondary | ICD-10-CM | POA: Diagnosis not present

## 2020-06-14 NOTE — Telephone Encounter (Signed)
Received call from [place], [place] with [facility]. Requesting [place] SW consult. States Aide went out yesterday and patient was dirty with same gown she had placed on patient the week before. Also, thinks patient may be hospice appropriate. SW will be able to determine that. VO given. Will route to Yellow Team for agreement/denial. Hubbard Hartshorn, BSN, RN-BC

## 2020-06-14 NOTE — Telephone Encounter (Signed)
Thanks

## 2020-06-15 NOTE — Addendum Note (Signed)
Addended by: Jean Rosenthal on: 06/15/2020 08:14 AM   Modules accepted: Orders

## 2020-06-18 ENCOUNTER — Encounter: Payer: Self-pay | Admitting: *Deleted

## 2020-06-18 DIAGNOSIS — I5033 Acute on chronic diastolic (congestive) heart failure: Secondary | ICD-10-CM | POA: Diagnosis not present

## 2020-06-18 DIAGNOSIS — D693 Immune thrombocytopenic purpura: Secondary | ICD-10-CM | POA: Diagnosis not present

## 2020-06-18 DIAGNOSIS — L97929 Non-pressure chronic ulcer of unspecified part of left lower leg with unspecified severity: Secondary | ICD-10-CM | POA: Diagnosis not present

## 2020-06-18 DIAGNOSIS — R269 Unspecified abnormalities of gait and mobility: Secondary | ICD-10-CM | POA: Diagnosis not present

## 2020-06-18 DIAGNOSIS — J9610 Chronic respiratory failure, unspecified whether with hypoxia or hypercapnia: Secondary | ICD-10-CM | POA: Diagnosis not present

## 2020-06-18 DIAGNOSIS — L89321 Pressure ulcer of left buttock, stage 1: Secondary | ICD-10-CM | POA: Diagnosis not present

## 2020-06-18 DIAGNOSIS — I130 Hypertensive heart and chronic kidney disease with heart failure and stage 1 through stage 4 chronic kidney disease, or unspecified chronic kidney disease: Secondary | ICD-10-CM | POA: Diagnosis not present

## 2020-06-18 DIAGNOSIS — L0390 Cellulitis, unspecified: Secondary | ICD-10-CM | POA: Diagnosis not present

## 2020-06-18 DIAGNOSIS — L97919 Non-pressure chronic ulcer of unspecified part of right lower leg with unspecified severity: Secondary | ICD-10-CM | POA: Diagnosis not present

## 2020-06-18 DIAGNOSIS — E662 Morbid (severe) obesity with alveolar hypoventilation: Secondary | ICD-10-CM | POA: Diagnosis not present

## 2020-06-18 DIAGNOSIS — N189 Chronic kidney disease, unspecified: Secondary | ICD-10-CM | POA: Diagnosis not present

## 2020-06-18 DIAGNOSIS — J9611 Chronic respiratory failure with hypoxia: Secondary | ICD-10-CM | POA: Diagnosis not present

## 2020-06-18 DIAGNOSIS — I872 Venous insufficiency (chronic) (peripheral): Secondary | ICD-10-CM | POA: Diagnosis not present

## 2020-06-19 DIAGNOSIS — L89321 Pressure ulcer of left buttock, stage 1: Secondary | ICD-10-CM | POA: Diagnosis not present

## 2020-06-19 DIAGNOSIS — L97919 Non-pressure chronic ulcer of unspecified part of right lower leg with unspecified severity: Secondary | ICD-10-CM | POA: Diagnosis not present

## 2020-06-19 DIAGNOSIS — D693 Immune thrombocytopenic purpura: Secondary | ICD-10-CM | POA: Diagnosis not present

## 2020-06-19 DIAGNOSIS — I872 Venous insufficiency (chronic) (peripheral): Secondary | ICD-10-CM | POA: Diagnosis not present

## 2020-06-19 DIAGNOSIS — L97929 Non-pressure chronic ulcer of unspecified part of left lower leg with unspecified severity: Secondary | ICD-10-CM | POA: Diagnosis not present

## 2020-06-19 DIAGNOSIS — I5033 Acute on chronic diastolic (congestive) heart failure: Secondary | ICD-10-CM | POA: Diagnosis not present

## 2020-06-19 DIAGNOSIS — N189 Chronic kidney disease, unspecified: Secondary | ICD-10-CM | POA: Diagnosis not present

## 2020-06-19 DIAGNOSIS — I130 Hypertensive heart and chronic kidney disease with heart failure and stage 1 through stage 4 chronic kidney disease, or unspecified chronic kidney disease: Secondary | ICD-10-CM | POA: Diagnosis not present

## 2020-06-19 DIAGNOSIS — L0390 Cellulitis, unspecified: Secondary | ICD-10-CM | POA: Diagnosis not present

## 2020-06-20 DIAGNOSIS — L89321 Pressure ulcer of left buttock, stage 1: Secondary | ICD-10-CM | POA: Diagnosis not present

## 2020-06-20 DIAGNOSIS — L97919 Non-pressure chronic ulcer of unspecified part of right lower leg with unspecified severity: Secondary | ICD-10-CM | POA: Diagnosis not present

## 2020-06-20 DIAGNOSIS — D693 Immune thrombocytopenic purpura: Secondary | ICD-10-CM | POA: Diagnosis not present

## 2020-06-20 DIAGNOSIS — I5033 Acute on chronic diastolic (congestive) heart failure: Secondary | ICD-10-CM | POA: Diagnosis not present

## 2020-06-20 DIAGNOSIS — N189 Chronic kidney disease, unspecified: Secondary | ICD-10-CM | POA: Diagnosis not present

## 2020-06-20 DIAGNOSIS — L97929 Non-pressure chronic ulcer of unspecified part of left lower leg with unspecified severity: Secondary | ICD-10-CM | POA: Diagnosis not present

## 2020-06-20 DIAGNOSIS — I872 Venous insufficiency (chronic) (peripheral): Secondary | ICD-10-CM | POA: Diagnosis not present

## 2020-06-20 DIAGNOSIS — I130 Hypertensive heart and chronic kidney disease with heart failure and stage 1 through stage 4 chronic kidney disease, or unspecified chronic kidney disease: Secondary | ICD-10-CM | POA: Diagnosis not present

## 2020-06-20 DIAGNOSIS — L0390 Cellulitis, unspecified: Secondary | ICD-10-CM | POA: Diagnosis not present

## 2020-06-21 DIAGNOSIS — I130 Hypertensive heart and chronic kidney disease with heart failure and stage 1 through stage 4 chronic kidney disease, or unspecified chronic kidney disease: Secondary | ICD-10-CM | POA: Diagnosis not present

## 2020-06-21 DIAGNOSIS — L0390 Cellulitis, unspecified: Secondary | ICD-10-CM | POA: Diagnosis not present

## 2020-06-21 DIAGNOSIS — I5033 Acute on chronic diastolic (congestive) heart failure: Secondary | ICD-10-CM | POA: Diagnosis not present

## 2020-06-21 DIAGNOSIS — I872 Venous insufficiency (chronic) (peripheral): Secondary | ICD-10-CM | POA: Diagnosis not present

## 2020-06-21 DIAGNOSIS — L89321 Pressure ulcer of left buttock, stage 1: Secondary | ICD-10-CM | POA: Diagnosis not present

## 2020-06-21 DIAGNOSIS — D693 Immune thrombocytopenic purpura: Secondary | ICD-10-CM | POA: Diagnosis not present

## 2020-06-21 DIAGNOSIS — L97929 Non-pressure chronic ulcer of unspecified part of left lower leg with unspecified severity: Secondary | ICD-10-CM | POA: Diagnosis not present

## 2020-06-21 DIAGNOSIS — L97919 Non-pressure chronic ulcer of unspecified part of right lower leg with unspecified severity: Secondary | ICD-10-CM | POA: Diagnosis not present

## 2020-06-21 DIAGNOSIS — N189 Chronic kidney disease, unspecified: Secondary | ICD-10-CM | POA: Diagnosis not present

## 2020-06-22 ENCOUNTER — Telehealth: Payer: Self-pay | Admitting: *Deleted

## 2020-06-22 DIAGNOSIS — N189 Chronic kidney disease, unspecified: Secondary | ICD-10-CM | POA: Diagnosis not present

## 2020-06-22 DIAGNOSIS — L0390 Cellulitis, unspecified: Secondary | ICD-10-CM | POA: Diagnosis not present

## 2020-06-22 DIAGNOSIS — I5033 Acute on chronic diastolic (congestive) heart failure: Secondary | ICD-10-CM | POA: Diagnosis not present

## 2020-06-22 DIAGNOSIS — I130 Hypertensive heart and chronic kidney disease with heart failure and stage 1 through stage 4 chronic kidney disease, or unspecified chronic kidney disease: Secondary | ICD-10-CM | POA: Diagnosis not present

## 2020-06-22 DIAGNOSIS — I872 Venous insufficiency (chronic) (peripheral): Secondary | ICD-10-CM | POA: Diagnosis not present

## 2020-06-22 DIAGNOSIS — D693 Immune thrombocytopenic purpura: Secondary | ICD-10-CM | POA: Diagnosis not present

## 2020-06-22 DIAGNOSIS — L89321 Pressure ulcer of left buttock, stage 1: Secondary | ICD-10-CM | POA: Diagnosis not present

## 2020-06-22 DIAGNOSIS — L97929 Non-pressure chronic ulcer of unspecified part of left lower leg with unspecified severity: Secondary | ICD-10-CM | POA: Diagnosis not present

## 2020-06-22 DIAGNOSIS — L97919 Non-pressure chronic ulcer of unspecified part of right lower leg with unspecified severity: Secondary | ICD-10-CM | POA: Diagnosis not present

## 2020-06-22 NOTE — Telephone Encounter (Signed)
CALLED PATIENT REGARDING WOUND CENTER REFERRAL/ PHONE RINGS SOMEONE PICKS UP WANT SAY NOTHING AND HANGS UP.

## 2020-06-25 DIAGNOSIS — N189 Chronic kidney disease, unspecified: Secondary | ICD-10-CM | POA: Diagnosis not present

## 2020-06-25 DIAGNOSIS — L97919 Non-pressure chronic ulcer of unspecified part of right lower leg with unspecified severity: Secondary | ICD-10-CM | POA: Diagnosis not present

## 2020-06-25 DIAGNOSIS — I5033 Acute on chronic diastolic (congestive) heart failure: Secondary | ICD-10-CM | POA: Diagnosis not present

## 2020-06-25 DIAGNOSIS — L0390 Cellulitis, unspecified: Secondary | ICD-10-CM | POA: Diagnosis not present

## 2020-06-25 DIAGNOSIS — I872 Venous insufficiency (chronic) (peripheral): Secondary | ICD-10-CM | POA: Diagnosis not present

## 2020-06-25 DIAGNOSIS — I130 Hypertensive heart and chronic kidney disease with heart failure and stage 1 through stage 4 chronic kidney disease, or unspecified chronic kidney disease: Secondary | ICD-10-CM | POA: Diagnosis not present

## 2020-06-25 DIAGNOSIS — D693 Immune thrombocytopenic purpura: Secondary | ICD-10-CM | POA: Diagnosis not present

## 2020-06-25 DIAGNOSIS — L89321 Pressure ulcer of left buttock, stage 1: Secondary | ICD-10-CM | POA: Diagnosis not present

## 2020-06-25 DIAGNOSIS — L97929 Non-pressure chronic ulcer of unspecified part of left lower leg with unspecified severity: Secondary | ICD-10-CM | POA: Diagnosis not present

## 2020-06-26 ENCOUNTER — Telehealth: Payer: Self-pay

## 2020-06-26 DIAGNOSIS — L97919 Non-pressure chronic ulcer of unspecified part of right lower leg with unspecified severity: Secondary | ICD-10-CM | POA: Diagnosis not present

## 2020-06-26 DIAGNOSIS — L89321 Pressure ulcer of left buttock, stage 1: Secondary | ICD-10-CM | POA: Diagnosis not present

## 2020-06-26 DIAGNOSIS — N189 Chronic kidney disease, unspecified: Secondary | ICD-10-CM | POA: Diagnosis not present

## 2020-06-26 DIAGNOSIS — L0390 Cellulitis, unspecified: Secondary | ICD-10-CM | POA: Diagnosis not present

## 2020-06-26 DIAGNOSIS — I5033 Acute on chronic diastolic (congestive) heart failure: Secondary | ICD-10-CM | POA: Diagnosis not present

## 2020-06-26 DIAGNOSIS — L97929 Non-pressure chronic ulcer of unspecified part of left lower leg with unspecified severity: Secondary | ICD-10-CM | POA: Diagnosis not present

## 2020-06-26 DIAGNOSIS — D693 Immune thrombocytopenic purpura: Secondary | ICD-10-CM | POA: Diagnosis not present

## 2020-06-26 DIAGNOSIS — I130 Hypertensive heart and chronic kidney disease with heart failure and stage 1 through stage 4 chronic kidney disease, or unspecified chronic kidney disease: Secondary | ICD-10-CM | POA: Diagnosis not present

## 2020-06-26 DIAGNOSIS — I872 Venous insufficiency (chronic) (peripheral): Secondary | ICD-10-CM | POA: Diagnosis not present

## 2020-06-26 NOTE — Telephone Encounter (Signed)
Received TC from [place] w/ [facility], states he saw patient for dressing change today and patient had a fever of 102.0, productive cough, chills.  Reports no SOB at rest, but some noted with activity.  He reports patient's family had a wedding over the weekend and had a lot of people come in and out of patient's home.  Randy encouraged patient to take tylenol, drink a lot of fluids, and have a Covid test.  He states he also informed patient she might need to go to the ER if she felt worse, but states patient is often noncompliant.   TC to patient, phone is answered by someone, then someone immediately hangs up on nurse.  RN attempted 3 different calls and each time someone hung up on nurse. SChaplin, RN,BSN

## 2020-06-26 NOTE — Telephone Encounter (Signed)
Agree with COVID test and urgent visit to ER w/ worsening symptoms.

## 2020-06-29 ENCOUNTER — Emergency Department (HOSPITAL_COMMUNITY): Payer: Medicare PPO

## 2020-06-29 ENCOUNTER — Encounter (HOSPITAL_COMMUNITY): Payer: Self-pay | Admitting: Emergency Medicine

## 2020-06-29 ENCOUNTER — Telehealth: Payer: Self-pay | Admitting: *Deleted

## 2020-06-29 ENCOUNTER — Other Ambulatory Visit: Payer: Self-pay

## 2020-06-29 ENCOUNTER — Inpatient Hospital Stay (HOSPITAL_COMMUNITY)
Admission: EM | Admit: 2020-06-29 | Discharge: 2020-07-10 | DRG: 177 | Disposition: A | Discharge status: Home Health | Payer: Medicare PPO | Attending: Internal Medicine | Admitting: Internal Medicine

## 2020-06-29 DIAGNOSIS — F32A Depression, unspecified: Secondary | ICD-10-CM | POA: Diagnosis present

## 2020-06-29 DIAGNOSIS — U071 COVID-19: Principal | ICD-10-CM | POA: Diagnosis present

## 2020-06-29 DIAGNOSIS — I517 Cardiomegaly: Secondary | ICD-10-CM | POA: Diagnosis not present

## 2020-06-29 DIAGNOSIS — I5033 Acute on chronic diastolic (congestive) heart failure: Secondary | ICD-10-CM | POA: Diagnosis not present

## 2020-06-29 DIAGNOSIS — J90 Pleural effusion, not elsewhere classified: Secondary | ICD-10-CM | POA: Diagnosis not present

## 2020-06-29 DIAGNOSIS — R0600 Dyspnea, unspecified: Secondary | ICD-10-CM | POA: Diagnosis not present

## 2020-06-29 DIAGNOSIS — E876 Hypokalemia: Secondary | ICD-10-CM | POA: Diagnosis present

## 2020-06-29 DIAGNOSIS — I159 Secondary hypertension, unspecified: Secondary | ICD-10-CM | POA: Diagnosis not present

## 2020-06-29 DIAGNOSIS — R0602 Shortness of breath: Secondary | ICD-10-CM | POA: Diagnosis not present

## 2020-06-29 DIAGNOSIS — E874 Mixed disorder of acid-base balance: Secondary | ICD-10-CM | POA: Diagnosis present

## 2020-06-29 DIAGNOSIS — R0902 Hypoxemia: Secondary | ICD-10-CM

## 2020-06-29 DIAGNOSIS — Z209 Contact with and (suspected) exposure to unspecified communicable disease: Secondary | ICD-10-CM | POA: Diagnosis not present

## 2020-06-29 DIAGNOSIS — Z79899 Other long term (current) drug therapy: Secondary | ICD-10-CM

## 2020-06-29 DIAGNOSIS — D693 Immune thrombocytopenic purpura: Secondary | ICD-10-CM | POA: Diagnosis not present

## 2020-06-29 DIAGNOSIS — L89812 Pressure ulcer of head, stage 2: Secondary | ICD-10-CM | POA: Diagnosis present

## 2020-06-29 DIAGNOSIS — Z23 Encounter for immunization: Secondary | ICD-10-CM | POA: Diagnosis not present

## 2020-06-29 DIAGNOSIS — I110 Hypertensive heart disease with heart failure: Secondary | ICD-10-CM | POA: Diagnosis present

## 2020-06-29 DIAGNOSIS — Z8249 Family history of ischemic heart disease and other diseases of the circulatory system: Secondary | ICD-10-CM

## 2020-06-29 DIAGNOSIS — K219 Gastro-esophageal reflux disease without esophagitis: Secondary | ICD-10-CM | POA: Diagnosis present

## 2020-06-29 DIAGNOSIS — R5381 Other malaise: Secondary | ICD-10-CM | POA: Diagnosis not present

## 2020-06-29 DIAGNOSIS — G8929 Other chronic pain: Secondary | ICD-10-CM | POA: Diagnosis present

## 2020-06-29 DIAGNOSIS — I10 Essential (primary) hypertension: Secondary | ICD-10-CM | POA: Diagnosis present

## 2020-06-29 DIAGNOSIS — Z9079 Acquired absence of other genital organ(s): Secondary | ICD-10-CM

## 2020-06-29 DIAGNOSIS — Z9981 Dependence on supplemental oxygen: Secondary | ICD-10-CM

## 2020-06-29 DIAGNOSIS — J1282 Pneumonia due to coronavirus disease 2019: Secondary | ICD-10-CM | POA: Diagnosis not present

## 2020-06-29 DIAGNOSIS — R269 Unspecified abnormalities of gait and mobility: Secondary | ICD-10-CM | POA: Diagnosis present

## 2020-06-29 DIAGNOSIS — Z90722 Acquired absence of ovaries, bilateral: Secondary | ICD-10-CM

## 2020-06-29 DIAGNOSIS — I130 Hypertensive heart and chronic kidney disease with heart failure and stage 1 through stage 4 chronic kidney disease, or unspecified chronic kidney disease: Secondary | ICD-10-CM | POA: Diagnosis not present

## 2020-06-29 DIAGNOSIS — K2100 Gastro-esophageal reflux disease with esophagitis, without bleeding: Secondary | ICD-10-CM | POA: Diagnosis not present

## 2020-06-29 DIAGNOSIS — I959 Hypotension, unspecified: Secondary | ICD-10-CM | POA: Diagnosis not present

## 2020-06-29 DIAGNOSIS — E662 Morbid (severe) obesity with alveolar hypoventilation: Secondary | ICD-10-CM | POA: Diagnosis present

## 2020-06-29 DIAGNOSIS — J9690 Respiratory failure, unspecified, unspecified whether with hypoxia or hypercapnia: Secondary | ICD-10-CM | POA: Diagnosis present

## 2020-06-29 DIAGNOSIS — R0689 Other abnormalities of breathing: Secondary | ICD-10-CM | POA: Diagnosis not present

## 2020-06-29 DIAGNOSIS — I890 Lymphedema, not elsewhere classified: Secondary | ICD-10-CM | POA: Diagnosis present

## 2020-06-29 DIAGNOSIS — J9601 Acute respiratory failure with hypoxia: Secondary | ICD-10-CM | POA: Diagnosis not present

## 2020-06-29 DIAGNOSIS — N189 Chronic kidney disease, unspecified: Secondary | ICD-10-CM | POA: Diagnosis not present

## 2020-06-29 DIAGNOSIS — J9622 Acute and chronic respiratory failure with hypercapnia: Secondary | ICD-10-CM | POA: Diagnosis not present

## 2020-06-29 DIAGNOSIS — L89321 Pressure ulcer of left buttock, stage 1: Secondary | ICD-10-CM | POA: Diagnosis not present

## 2020-06-29 DIAGNOSIS — Z90710 Acquired absence of both cervix and uterus: Secondary | ICD-10-CM

## 2020-06-29 DIAGNOSIS — G9341 Metabolic encephalopathy: Secondary | ICD-10-CM | POA: Diagnosis present

## 2020-06-29 DIAGNOSIS — J9691 Respiratory failure, unspecified with hypoxia: Secondary | ICD-10-CM | POA: Diagnosis not present

## 2020-06-29 DIAGNOSIS — M810 Age-related osteoporosis without current pathological fracture: Secondary | ICD-10-CM | POA: Diagnosis present

## 2020-06-29 DIAGNOSIS — I872 Venous insufficiency (chronic) (peripheral): Secondary | ICD-10-CM | POA: Diagnosis present

## 2020-06-29 DIAGNOSIS — R000 Tachycardia, unspecified: Secondary | ICD-10-CM | POA: Diagnosis not present

## 2020-06-29 DIAGNOSIS — Z6841 Body Mass Index (BMI) 40.0 and over, adult: Secondary | ICD-10-CM | POA: Diagnosis not present

## 2020-06-29 DIAGNOSIS — L97919 Non-pressure chronic ulcer of unspecified part of right lower leg with unspecified severity: Secondary | ICD-10-CM | POA: Diagnosis not present

## 2020-06-29 DIAGNOSIS — L0390 Cellulitis, unspecified: Secondary | ICD-10-CM | POA: Diagnosis not present

## 2020-06-29 DIAGNOSIS — G4733 Obstructive sleep apnea (adult) (pediatric): Secondary | ICD-10-CM | POA: Diagnosis not present

## 2020-06-29 DIAGNOSIS — M2550 Pain in unspecified joint: Secondary | ICD-10-CM | POA: Diagnosis not present

## 2020-06-29 DIAGNOSIS — J9621 Acute and chronic respiratory failure with hypoxia: Secondary | ICD-10-CM | POA: Diagnosis present

## 2020-06-29 DIAGNOSIS — Z7401 Bed confinement status: Secondary | ICD-10-CM | POA: Diagnosis not present

## 2020-06-29 DIAGNOSIS — Z5329 Procedure and treatment not carried out because of patient's decision for other reasons: Secondary | ICD-10-CM | POA: Diagnosis not present

## 2020-06-29 DIAGNOSIS — L97929 Non-pressure chronic ulcer of unspecified part of left lower leg with unspecified severity: Secondary | ICD-10-CM | POA: Diagnosis not present

## 2020-06-29 DIAGNOSIS — J9811 Atelectasis: Secondary | ICD-10-CM | POA: Diagnosis not present

## 2020-06-29 DIAGNOSIS — Z87891 Personal history of nicotine dependence: Secondary | ICD-10-CM

## 2020-06-29 DIAGNOSIS — Z981 Arthrodesis status: Secondary | ICD-10-CM

## 2020-06-29 DIAGNOSIS — J9602 Acute respiratory failure with hypercapnia: Secondary | ICD-10-CM | POA: Diagnosis not present

## 2020-06-29 LAB — I-STAT ARTERIAL BLOOD GAS, ED
Acid-Base Excess: 6 mmol/L — ABNORMAL HIGH (ref 0.0–2.0)
Acid-Base Excess: 6 mmol/L — ABNORMAL HIGH (ref 0.0–2.0)
Acid-Base Excess: 7 mmol/L — ABNORMAL HIGH (ref 0.0–2.0)
Bicarbonate: 35.3 mmol/L — ABNORMAL HIGH (ref 20.0–28.0)
Bicarbonate: 34.6 mmol/L — ABNORMAL HIGH (ref 20.0–28.0)
Bicarbonate: 35.6 mmol/L — ABNORMAL HIGH (ref 20.0–28.0)
Calcium, Ion: 1.23 mmol/L (ref 1.15–1.40)
Calcium, Ion: 1.19 mmol/L (ref 1.15–1.40)
Calcium, Ion: 1.17 mmol/L (ref 1.15–1.40)
HCT: 32 % — ABNORMAL LOW (ref 36.0–46.0)
HCT: 31 % — ABNORMAL LOW (ref 36.0–46.0)
HCT: 32 % — ABNORMAL LOW (ref 36.0–46.0)
Hemoglobin: 10.9 g/dL — ABNORMAL LOW (ref 12.0–15.0)
Hemoglobin: 10.5 g/dL — ABNORMAL LOW (ref 12.0–15.0)
Hemoglobin: 10.9 g/dL — ABNORMAL LOW (ref 12.0–15.0)
O2 Saturation: 93 %
O2 Saturation: 93 %
O2 Saturation: 93 %
Patient temperature: 99.4
Patient temperature: 99.4
Potassium: 3.9 mmol/L (ref 3.5–5.1)
Potassium: 3.9 mmol/L (ref 3.5–5.1)
Potassium: 3.5 mmol/L (ref 3.5–5.1)
Sodium: 138 mmol/L (ref 135–145)
Sodium: 139 mmol/L (ref 135–145)
Sodium: 138 mmol/L (ref 135–145)
TCO2: 38 mmol/L — ABNORMAL HIGH (ref 22–32)
TCO2: 37 mmol/L — ABNORMAL HIGH (ref 22–32)
TCO2: 38 mmol/L — ABNORMAL HIGH (ref 22–32)
pCO2 arterial: 77.1 mmHg (ref 32.0–48.0)
pCO2 arterial: 75.8 mmHg (ref 32.0–48.0)
pCO2 arterial: 75.3 mmHg (ref 32.0–48.0)
pH, Arterial: 7.269 — ABNORMAL LOW (ref 7.350–7.450)
pH, Arterial: 7.269 — ABNORMAL LOW (ref 7.350–7.450)
pH, Arterial: 7.286 — ABNORMAL LOW (ref 7.350–7.450)
pO2, Arterial: 78 mmHg — ABNORMAL LOW (ref 83.0–108.0)
pO2, Arterial: 82 mmHg — ABNORMAL LOW (ref 83.0–108.0)
pO2, Arterial: 80 mmHg — ABNORMAL LOW (ref 83.0–108.0)

## 2020-06-29 LAB — CBC
HCT: 36.4 % (ref 36.0–46.0)
Hemoglobin: 10.6 g/dL — ABNORMAL LOW (ref 12.0–15.0)
MCH: 28.3 pg (ref 26.0–34.0)
MCHC: 29.1 g/dL — ABNORMAL LOW (ref 30.0–36.0)
MCV: 97.1 fL (ref 80.0–100.0)
Platelets: 55 10*3/uL — ABNORMAL LOW (ref 150–400)
RBC: 3.75 MIL/uL — ABNORMAL LOW (ref 3.87–5.11)
RDW: 16.5 % — ABNORMAL HIGH (ref 11.5–15.5)
WBC: 6.2 10*3/uL (ref 4.0–10.5)
nRBC: 0 % (ref 0.0–0.2)

## 2020-06-29 LAB — LACTIC ACID, PLASMA
Lactic Acid, Venous: 0.7 mmol/L (ref 0.5–1.9)
Lactic Acid, Venous: 2.1 mmol/L (ref 0.5–1.9)
Lactic Acid, Venous: 0.9 mmol/L (ref 0.5–1.9)

## 2020-06-29 LAB — COMPREHENSIVE METABOLIC PANEL
ALT: 24 U/L (ref 0–44)
AST: 31 U/L (ref 15–41)
Albumin: 3.3 g/dL — ABNORMAL LOW (ref 3.5–5.0)
Alkaline Phosphatase: 83 U/L (ref 38–126)
Anion gap: 11 (ref 5–15)
BUN: 15 mg/dL (ref 8–23)
CO2: 30 mmol/L (ref 22–32)
Calcium: 8.4 mg/dL — ABNORMAL LOW (ref 8.9–10.3)
Chloride: 96 mmol/L — ABNORMAL LOW (ref 98–111)
Creatinine, Ser: 0.66 mg/dL (ref 0.44–1.00)
GFR, Estimated: >60 mL/min (ref >60)
Glucose, Bld: 82 mg/dL (ref 70–99)
Potassium: 4.2 mmol/L (ref 3.5–5.1)
Sodium: 137 mmol/L (ref 135–145)
Total Bilirubin: 1.2 mg/dL (ref 0.3–1.2)
Total Protein: 5.7 g/dL — ABNORMAL LOW (ref 6.5–8.1)

## 2020-06-29 LAB — CBC WITH DIFFERENTIAL/PLATELET
Abs Immature Granulocytes: 0.04 10*3/uL (ref 0.00–0.07)
Basophils Absolute: 0 10*3/uL (ref 0.0–0.1)
Basophils Relative: 0 %
Eosinophils Absolute: 0 10*3/uL (ref 0.0–0.5)
Eosinophils Relative: 0 %
HCT: 38.4 % (ref 36.0–46.0)
Hemoglobin: 11.3 g/dL — ABNORMAL LOW (ref 12.0–15.0)
Immature Granulocytes: 1 %
Lymphocytes Relative: 16 %
Lymphs Abs: 1 10*3/uL (ref 0.7–4.0)
MCH: 28.4 pg (ref 26.0–34.0)
MCHC: 29.4 g/dL — ABNORMAL LOW (ref 30.0–36.0)
MCV: 96.5 fL (ref 80.0–100.0)
Monocytes Absolute: 0.6 10*3/uL (ref 0.1–1.0)
Monocytes Relative: 10 %
Neutro Abs: 4.6 10*3/uL (ref 1.7–7.7)
Neutrophils Relative %: 73 %
Platelets: 58 10*3/uL — ABNORMAL LOW (ref 150–400)
RBC: 3.98 MIL/uL (ref 3.87–5.11)
RDW: 16.5 % — ABNORMAL HIGH (ref 11.5–15.5)
WBC: 6.3 10*3/uL (ref 4.0–10.5)
nRBC: 0 % (ref 0.0–0.2)

## 2020-06-29 LAB — FIBRINOGEN: Fibrinogen: 446 mg/dL (ref 210–475)

## 2020-06-29 LAB — URINALYSIS, ROUTINE W REFLEX MICROSCOPIC
Bilirubin Urine: NEGATIVE
Glucose, UA: NEGATIVE mg/dL
Hgb urine dipstick: NEGATIVE
Ketones, ur: 5 mg/dL — AB
Leukocytes,Ua: NEGATIVE
Nitrite: NEGATIVE
Protein, ur: 30 mg/dL — AB
Specific Gravity, Urine: 1.013 (ref 1.005–1.030)
pH: 5 (ref 5.0–8.0)

## 2020-06-29 LAB — LACTATE DEHYDROGENASE: LDH: 200 U/L — ABNORMAL HIGH (ref 98–192)

## 2020-06-29 LAB — CREATININE, SERUM
Creatinine, Ser: 0.67 mg/dL (ref 0.44–1.00)
GFR, Estimated: >60 mL/min (ref >60)

## 2020-06-29 LAB — RESPIRATORY PANEL BY RT PCR (FLU A&B, COVID)
Influenza A by PCR: NEGATIVE
Influenza B by PCR: NEGATIVE
SARS Coronavirus 2 by RT PCR: POSITIVE — AB

## 2020-06-29 LAB — C-REACTIVE PROTEIN: CRP: 2.5 mg/dL — ABNORMAL HIGH (ref <1.0)

## 2020-06-29 LAB — PROTIME-INR
INR: 1 (ref 0.8–1.2)
Prothrombin Time: 12.8 seconds (ref 11.4–15.2)

## 2020-06-29 LAB — FERRITIN: Ferritin: 163 ng/mL (ref 11–307)

## 2020-06-29 LAB — APTT: aPTT: 30 seconds (ref 24–36)

## 2020-06-29 LAB — D-DIMER, QUANTITATIVE: D-Dimer, Quant: 0.44 ug/mL-FEU (ref 0.00–0.50)

## 2020-06-29 LAB — PROCALCITONIN: Procalcitonin: <0.1 ng/mL

## 2020-06-29 LAB — TRIGLYCERIDES: Triglycerides: 229 mg/dL — ABNORMAL HIGH (ref <150)

## 2020-06-29 MED ORDER — VANCOMYCIN HCL 10 G IV SOLR
2500.0000 mg | Freq: Once | INTRAVENOUS | Status: AC
  Administered 2020-06-29: 2500 mg via INTRAVENOUS
  Filled 2020-06-29: qty 2500

## 2020-06-29 MED ORDER — METRONIDAZOLE IN NACL 5-0.79 MG/ML-% IV SOLN
500.0000 mg | Freq: Once | INTRAVENOUS | Status: AC
  Administered 2020-06-29: 500 mg via INTRAVENOUS
  Filled 2020-06-29: qty 100

## 2020-06-29 MED ORDER — VANCOMYCIN HCL IN DEXTROSE 1-5 GM/200ML-% IV SOLN: 1000.0000 mg | Freq: Once | INTRAVENOUS | Status: DC

## 2020-06-29 MED ORDER — LACTATED RINGERS IV SOLN: INTRAVENOUS | Status: DC

## 2020-06-29 MED ORDER — POLYETHYLENE GLYCOL 3350 17 G PO PACK: 17.0000 g | PACK | Freq: Every day | ORAL | Status: DC | PRN

## 2020-06-29 MED ORDER — SODIUM CHLORIDE 0.9 % IV SOLN
200.0000 mg | Freq: Once | INTRAVENOUS | Status: AC
  Administered 2020-06-29: 200 mg via INTRAVENOUS
  Filled 2020-06-29: qty 40

## 2020-06-29 MED ORDER — DOCUSATE SODIUM 100 MG PO CAPS: 100.0000 mg | ORAL_CAPSULE | Freq: Two times a day (BID) | ORAL | Status: DC | PRN

## 2020-06-29 MED ORDER — CARVEDILOL 3.125 MG PO TABS
3.1250 mg | ORAL_TABLET | Freq: Two times a day (BID) | ORAL | Status: DC
  Administered 2020-06-30 – 2020-07-03 (×7): 3.125 mg via ORAL
  Filled 2020-06-29 (×8): qty 1

## 2020-06-29 MED ORDER — SODIUM CHLORIDE 0.9 % IV SOLN
2.0000 g | Freq: Three times a day (TID) | INTRAVENOUS | Status: DC
  Administered 2020-06-29 – 2020-06-30 (×2): 2 g via INTRAVENOUS
  Filled 2020-06-29 (×2): qty 2

## 2020-06-29 MED ORDER — SODIUM CHLORIDE 0.9 % IV SOLN
2.0000 g | Freq: Once | INTRAVENOUS | Status: AC
  Administered 2020-06-29: 2 g via INTRAVENOUS
  Filled 2020-06-29 (×2): qty 2

## 2020-06-29 MED ORDER — SODIUM CHLORIDE 0.9 % IV SOLN
100.0000 mg | Freq: Every day | INTRAVENOUS | Status: AC
  Administered 2020-06-30 – 2020-07-03 (×4): 100 mg via INTRAVENOUS
  Filled 2020-06-29 (×5): qty 20

## 2020-06-29 MED ORDER — HEPARIN SODIUM (PORCINE) 5000 UNIT/ML IJ SOLN
5000.0000 [IU] | Freq: Three times a day (TID) | INTRAMUSCULAR | Status: DC
  Administered 2020-06-29 – 2020-07-01 (×5): 5000 [IU] via SUBCUTANEOUS
  Filled 2020-06-29 (×4): qty 1

## 2020-06-29 MED ORDER — SODIUM CHLORIDE 0.9 % IV SOLN: INTRAVENOUS | Status: DC

## 2020-06-29 MED ORDER — VANCOMYCIN HCL 750 MG/150ML IV SOLN
750.0000 mg | Freq: Two times a day (BID) | INTRAVENOUS | Status: DC
  Administered 2020-06-30: 750 mg via INTRAVENOUS
  Filled 2020-06-29 (×2): qty 150

## 2020-06-29 MED ORDER — VANCOMYCIN HCL 2000 MG/400ML IV SOLN
2000.0000 mg | Freq: Once | INTRAVENOUS | Status: DC
  Filled 2020-06-29: qty 400

## 2020-06-29 MED ORDER — FUROSEMIDE 10 MG/ML IJ SOLN
80.0000 mg | Freq: Every day | INTRAMUSCULAR | Status: DC
  Administered 2020-06-29 – 2020-07-05 (×7): 80 mg via INTRAVENOUS
  Filled 2020-06-29 (×8): qty 8

## 2020-06-29 MED ORDER — IPRATROPIUM-ALBUTEROL 0.5-2.5 (3) MG/3ML IN SOLN
3.0000 mL | RESPIRATORY_TRACT | Status: DC
  Administered 2020-06-30: 3 mL via RESPIRATORY_TRACT
  Filled 2020-06-29: qty 3

## 2020-06-29 MED ORDER — VANCOMYCIN HCL 750 MG/150ML IV SOLN
750.0000 mg | Freq: Two times a day (BID) | INTRAVENOUS | Status: DC
  Filled 2020-06-29: qty 150

## 2020-06-29 MED ORDER — SODIUM CHLORIDE 0.9 % IV BOLUS: 1000.0000 mL | Freq: Once | INTRAVENOUS | Status: DC

## 2020-06-29 NOTE — ED Notes (Signed)
Son Randall Hiss [phone] like an update

## 2020-06-29 NOTE — Progress Notes (Signed)
Placed pt on HFNC per MD request at 8L with spo2 100%. Pt denies SOB no increased WOB, VS within normal limits. RN made aware

## 2020-06-29 NOTE — Progress Notes (Signed)
eLink Physician-Brief Progress Note Patient Name: [name] DOB: December 17, 1950 MRN: [id number]   Date of Service  06/29/2020  HPI/Events of Note  ABG on 60%/IPAP 12/EPAP 8/Rate 28 = 7.286/75.3/80.  eICU Interventions  Plan: 1. Increase rate to 32. 2. Repeat ABG at 1 AM.     Intervention Category Major Interventions: Acid-Base disturbance - evaluation and management;Respiratory failure - evaluation and management  Djuan Talton Eugene 06/29/2020, 11:22 PM

## 2020-06-29 NOTE — ED Triage Notes (Signed)
Pt here from home with known covid exposure at a weeding , pt is normally on 4 liters ,sats in the 80'S pt placed on NRB sats  Up to 100 % , non vaccinated

## 2020-06-29 NOTE — Telephone Encounter (Signed)
Agree with ED evaluation. 

## 2020-06-29 NOTE — ED Notes (Signed)
Patient becoming less responsive, notified RT and CCM. Order for repeat abg stat.

## 2020-06-29 NOTE — ED Notes (Signed)
PAGED CCM TO RN KIM--Yalda Herd

## 2020-06-29 NOTE — Telephone Encounter (Addendum)
Museum/gallery conservator, RN with [facility] called in from patient's home. States patient O2 sat is 81% on 4 L O2 via [place]. T 99.1 P 112 BP 138/82. Patient is refusing to go to ED. After speaking with patient she has agreed. Amber will call 911 right now. Please see phone note from 06/26/2020. Patient has not had test for Covid and did not know what it was. Hubbard Hartshorn, BSN, RN-BC

## 2020-06-29 NOTE — ED Provider Notes (Signed)
[facility] EMERGENCY DEPARTMENT Provider Note   CSN: [id number] Arrival date & time: 06/29/20  1308     History No chief complaint on file.   [name] is a 69 y.o. female.  Patient followed by internal medicine teaching service.  Patient normally on 4 L of oxygen due to respiratory compromise due to elevated diaphragm as well as obesity.  Patient also has bilateral lower extremity swelling.  And has had some skin breakdown due to that.  Patient states that she started having trouble breathing about 4 days ago.  Her oxygen sats today were 80% on her normal amount of oxygen.  Patient was placed on high percent nonrebreather oxygen sats went up to 100%.  Patient not vaccinated.  Patient without any known exposures her daughter recently got married but she did not go to the wedding.  Initial concerns for possible sepsis due to the tachycardia and the tachypnea.  Temp was only 99.4.  No hypotension.  Also patient was started on sepsis protocol.  Received broad-spectrum antibiotics did not meet requirement for the 30 cc/kg fluid challenge.  Concern would be for possible Covid infection.        Past Medical History:  Diagnosis Date  . Arthritis    lumbar burst fx, osteoporosis  . Back pain 09/29/2013  . Blood dyscrasia   . Chest pain    denies  . Chronic kidney disease    passed stones spontaneously- 40 yrs. ago   . Depression    states her current situation with activity limitations, she has a low spirit at times.   Marland Kitchen Dysrhythmia    occ tachycardia  . Fracture of vertebra, compression ([place])   . GERD (gastroesophageal reflux disease)    occ  . History of kidney stones   . HTN (hypertension)   . ITP (idiopathic thrombocytopenic purpura) 08/29/2013  . Leukocytosis, unspecified 09/05/2013  . Muscle cramp 11/16/2013  . Tachycardia    occ upon exersion, and when put on bp med    Patient Active Problem List   Diagnosis Date Noted  . Shock ([place])  05/14/2020  . Chronic, continuous use of opioids 05/14/2020  . Opioid overdose ([place]) 05/14/2020  . Acute on chronic respiratory failure with hypoxia and hypercapnia ([place]) 05/14/2020  . Pressure injury of skin 04/22/2020  . Sepsis due to cellulitis ([place]) 04/18/2020  . Venous stasis dermatitis of both lower extremities 02/25/2019  . Encounter for preventive care 10/20/2018  . Chronic respiratory failure with hypoxia and hypercapnia ([place]) 10/06/2018  . Polycythemia, secondary 10/05/2018  . Obesity hypoventilation syndrome ([place]) 10/04/2018  . AKI (acute kidney injury) ([place])   . BMI 40.0-44.9, adult ([place])   . Bilateral lower extremity edema   . Venous stasis dermatitis 09/30/2018  . Wound of lower extremity, bilateral 09/30/2018  . Chronic heart failure with preserved ejection fraction with moderate RV failure 06/09/2017  . GERD (gastroesophageal reflux disease) 11/13/2014  . Opiate-induced constipation 11/13/2014  . History of tobacco abuse 03/10/2014  . Chronic back pain secondary to traumatic L1 fracture 09/29/2013  . Chronic idiopathic thrombocytopenia ([place]) 08/29/2013  . HTN (hypertension)     Past Surgical History:  Procedure Laterality Date  . ABDOMINAL HYSTERECTOMY    . ANTERIOR LAT LUMBAR FUSION N/A 03/28/2014   Procedure: Anterolateral decompression of L1 fracture with posterior segmental stabilization;  Surgeon: Kristeen Miss, MD;  Location: [facility];  Service: Neurosurgery;  Laterality: N/A;  Anterolateral decompression of Lumbar One fracture with posterior segmental  stabilization  . CESAREAN SECTION     x2  . CHEST TUBE INSERTION Left 03/28/2014   Procedure: CHEST TUBE INSERTION;  Surgeon: Ivin Poot, MD;  Location: [facility];  Service: Thoracic;  Laterality: Left;  . INCISIONAL HERNIA REPAIR N/A 11/13/2014   Procedure: PRIMARY REPAIR INCARCERATED INCISIONAL HERNIA WITH PLACEMENT BIOLOGIC MESH;  Surgeon: Rolm Bookbinder, MD;  Location: [facility];  Service: General;   Laterality: N/A;  . LAPAROTOMY Right 09   ovarian cyst  . TOTAL ABDOMINAL HYSTERECTOMY W/ BILATERAL SALPINGOOPHORECTOMY  97     OB History   No obstetric history on file.     Family History  Problem Relation Age of Onset  . Hypertension Other   . Heart attack Other   . Leukemia Paternal Uncle        leukemia  . Heart attack Mother   . Hypertension Mother   . Heart disease Father     Social History   Tobacco Use  . Smoking status: Former Smoker    Packs/day: 1.00    Years: 15.00    Pack years: 15.00    Types: Cigarettes    Quit date: 09/15/2014    Years since quitting: 5.7  . Smokeless tobacco: Never Used  Vaping Use  . Vaping Use: Never used  Substance Use Topics  . Alcohol use: Yes    Comment: rare  . Drug use: No    Home Medications Prior to Admission medications   Medication Sig Start Date End Date Taking? Authorizing Provider  carvedilol (COREG) 3.125 MG tablet Take 1 tablet (3.125 mg total) by mouth 2 (two) times daily with a meal. 05/18/20   Andrew Au, MD  diclofenac Sodium (VOLTAREN) 1 % GEL Apply 4 g topically 4 (four) times daily as needed (knee pain). 05/18/20   Andrew Au, MD  furosemide (LASIX) 80 MG tablet TAKE 1 TABLET BY MOUTH DAILY; 1 TABLET TWICE DAILY IF YOU GAIN 3 POUNDS IN 1 WEEK OR 5 POUNDS IN 1 DAY Patient taking differently: Take 80 mg by mouth See admin instructions. Take 80 mg by mouth once a day and increase to 80 mg two times a day if either 3 pounds are gained in a week OR 5 pounds in one day 03/28/20   Harvie Heck, MD  loperamide (IMODIUM) 2 MG capsule Take 1 capsule (2 mg total) by mouth as needed for diarrhea or loose stools. 05/18/20   Andrew Au, MD  potassium chloride (K-DUR) 10 MEQ tablet Take 1 tablet (10 mEq total) by mouth daily. 06/20/17   Caren Griffins, MD  ramelteon (ROZEREM) 8 MG tablet Take 1 tablet (8 mg total) by mouth at bedtime. 05/18/20   Andrew Au, MD    Allergies    Patient has no known  allergies.  Review of Systems   Review of Systems  Unable to perform ROS: Severe respiratory distress    Physical Exam Updated Vital Signs BP (!) 124/52 Comment: pt placed on 10L HFNC and 15 NRB  Pulse (!) 116 Comment: pt placed on 10L HFNC and 15 NRB  Temp 99.4 F (37.4 C) (Oral)   Resp (!) 22 Comment: pt placed on 10L HFNC and 15 NRB  Ht 1.524 m (5')   Wt 100.6 kg   SpO2 100%   BMI 43.31 kg/m   Physical Exam Vitals and nursing note reviewed.  Constitutional:      General: She is not in acute distress.    Appearance:  Normal appearance. She is well-developed.  HENT:     Head: Normocephalic and atraumatic.  Eyes:     Extraocular Movements: Extraocular movements intact.     Conjunctiva/sclera: Conjunctivae normal.     Pupils: Pupils are equal, round, and reactive to light.  Cardiovascular:     Rate and Rhythm: Regular rhythm. Tachycardia present.     Heart sounds: No murmur heard.   Pulmonary:     Effort: Respiratory distress present.     Breath sounds: Normal breath sounds.  Abdominal:     Palpations: Abdomen is soft.     Tenderness: There is no abdominal tenderness.  Musculoskeletal:     Cervical back: Normal range of motion and neck supple.     Right lower leg: Edema present.     Left lower leg: Edema present.  Skin:    General: Skin is warm and dry.  Neurological:     General: No focal deficit present.     Mental Status: She is alert and oriented to person, place, and time.     Cranial Nerves: No cranial nerve deficit.     Motor: No weakness.     ED Results / Procedures / Treatments   Labs (all labs ordered are listed, but only abnormal results are displayed) Labs Reviewed  RESPIRATORY PANEL BY RT PCR (FLU A&B, COVID) - Abnormal; Notable for the following components:      Result Value   SARS Coronavirus 2 by RT PCR POSITIVE (*)    All other components within normal limits  COMPREHENSIVE METABOLIC PANEL - Abnormal; Notable for the following components:    Chloride 96 (*)    Calcium 8.4 (*)    Total Protein 5.7 (*)    Albumin 3.3 (*)    All other components within normal limits  LACTIC ACID, PLASMA - Abnormal; Notable for the following components:   Lactic Acid, Venous 2.1 (*)    All other components within normal limits  LACTATE DEHYDROGENASE - Abnormal; Notable for the following components:   LDH 200 (*)    All other components within normal limits  CBC WITH DIFFERENTIAL/PLATELET - Abnormal; Notable for the following components:   Hemoglobin 11.3 (*)    MCHC 29.4 (*)    RDW 16.5 (*)    Platelets 58 (*)    All other components within normal limits  TRIGLYCERIDES - Abnormal; Notable for the following components:   Triglycerides 229 (*)    All other components within normal limits  URINE CULTURE  LACTIC ACID, PLASMA  PROTIME-INR  APTT  D-DIMER, QUANTITATIVE (NOT AT [facility])  FIBRINOGEN  CBC WITH DIFFERENTIAL/PLATELET  URINALYSIS, ROUTINE W REFLEX MICROSCOPIC  PROCALCITONIN  FERRITIN  C-REACTIVE PROTEIN  BLOOD GAS, ARTERIAL  BLOOD GAS, ARTERIAL  I-STAT ARTERIAL BLOOD GAS, ED    EKG EKG Interpretation  Date/Time:  Friday June 29 2020 13:15:27 EDT Ventricular Rate:  109 PR Interval:    QRS Duration: 96 QT Interval:  333 QTC Calculation: 449 R Axis:   114 Text Interpretation: Sinus tachycardia Probable left atrial enlargement Low voltage, precordial leads Consider right ventricular hypertrophy Probable anteroseptal infarct, old Minimal ST elevation, inferior leads Confirmed by Fredia Sorrow [phone]) on 06/29/2020 1:35:43 PM   Radiology DG Chest Port 1 View  Result Date: 06/29/2020 CLINICAL DATA:  Shortness of breath.  Known COVID exposure. EXAM: PORTABLE CHEST 1 VIEW COMPARISON:  Chest x-ray dated May 13, 2020. FINDINGS: Cardiomegaly. Normal pulmonary vascularity. Similar appearing low lung volumes with chronically elevated right hemidiaphragm. Streaky  opacities at the lung bases, slightly progressed since the  prior study. No pneumothorax or large pleural effusion. No acute osseous abnormality. Prior corpectomy near the thoracolumbar junction. IMPRESSION: 1. Slightly worsened bibasilar atelectasis versus infiltrates. Electronically Signed   By: Titus Dubin M.D.   On: 06/29/2020 14:39    Procedures Procedures (including critical care time) CRITICAL CARE Performed by: Fredia Sorrow Total critical care time: 60 minutes Critical care time was exclusive of separately billable procedures and treating other patients. Critical care was necessary to treat or prevent imminent or life-threatening deterioration. Critical care was time spent personally by me on the following activities: development of treatment plan with patient and/or surrogate as well as nursing, discussions with consultants, evaluation of patient's response to treatment, examination of patient, obtaining history from patient or surrogate, ordering and performing treatments and interventions, ordering and review of laboratory studies, ordering and review of radiographic studies, pulse oximetry and re-evaluation of patient's condition.   Medications Ordered in ED Medications  vancomycin (VANCOCIN) 2,500 mg in sodium chloride 0.9 % 500 mL IVPB (2,500 mg Intravenous New Bag/Given 06/29/20 1657)  0.9 %  sodium chloride infusion ( Intravenous New Bag/Given 06/29/20 1701)  ceFEPIme (MAXIPIME) 2 g in sodium chloride 0.9 % 100 mL IVPB (has no administration in time range)  vancomycin (VANCOREADY) IVPB 750 mg/150 mL (has no administration in time range)  ceFEPIme (MAXIPIME) 2 g in sodium chloride 0.9 % 100 mL IVPB (0 g Intravenous Stopped 06/29/20 1609)  metroNIDAZOLE (FLAGYL) IVPB 500 mg (0 mg Intravenous Stopped 06/29/20 1700)    ED Course  I have reviewed the triage vital signs and the nursing notes.  Pertinent labs & imaging results that were available during my care of the patient were reviewed by me and considered in my medical decision  making (see chart for details).    MDM Rules/Calculators/A&P                          Patient was switched to high flow nasal cannula.  Initially was satting pretty well on that.  But then began to run into respiratory distress breathing rate went up.  Oxygen sats even at 8 L high flow was dropping down to 88%.  So patient is put back on 100% nonrebreather.  Respiratory consulted and patient mentation was still pretty good so we opted to do BiPAP with the ventilator.  Patient actually tolerated this very well.  Oxygen sats came up respiratory rate went down.  Patient is mentating well following commands.  Patient's Covid test was positive so this is Covid infection chest x-ray without any marked abnormalities on the chest x-ray.  Patient's lactic acid elevated a little bit at 2.1.  No leukocytosis.  Renal function normal.  On the fact the patient was going on BiPAP critical care was called.  They will see patient in consultation.  Covid labs have been ordered and also remdesivir is been ordered.   Final Clinical Impression(s) / ED Diagnoses Final diagnoses:  COVID  Hypoxia    Rx / DC Orders ED Discharge Orders    None       Fredia Sorrow, MD 06/29/20 1713

## 2020-06-29 NOTE — Progress Notes (Signed)
ABG collected and critical values called over to [facility].

## 2020-06-29 NOTE — ED Provider Notes (Signed)
  Physical Exam  BP (!) 124/52 Comment: pt placed on 10L HFNC and 15 NRB  Pulse (!) 116 Comment: pt placed on 10L HFNC and 15 NRB  Temp 99.4 F (37.4 C) (Oral)   Resp (!) 22 Comment: pt placed on 10L HFNC and 15 NRB  Ht 1.524 m (5')   Wt 100.6 kg   SpO2 95% Comment: pt placed on 10L HFNC and 15 NRB  BMI 43.31 kg/m   Physical Exam  ED Course/Procedures     Procedures  MDM  69 yo female received from Dr. Rogene Houston- patient presents with hypoxic respiratory failure and covid.  Critical care consulted by Dr. Rogene Houston and they will see in consult for admission. ABG pending       Pattricia Boss, MD 06/30/20 0004

## 2020-06-29 NOTE — H&P (Signed)
NAME:  [name], MRN:  [id number], DOB:  1951-04-21, LOS: 0 ADMISSION DATE:  06/29/2020, CONSULTATION DATE:  06/29/2020 REFERRING MD:  Rogene Houston _[facility], CHIEF COMPLAINT:  hypoxia   HPI/course in hospital  69 year old woman who was convinced to come into ED by home health RN for oxygen saturations down to the 80's.     Patient is confused and cannot provide reliable collateral history.    Past Medical History   Past Medical History:  Diagnosis Date  . Arthritis    lumbar burst fx, osteoporosis  . Back pain 09/29/2013  . Blood dyscrasia   . Chest pain    denies  . Chronic kidney disease    passed stones spontaneously- 40 yrs. ago   . Depression    states her current situation with activity limitations, she has a low spirit at times.   Marland Kitchen Dysrhythmia    occ tachycardia  . Fracture of vertebra, compression ([place])   . GERD (gastroesophageal reflux disease)    occ  . History of kidney stones   . HTN (hypertension)   . ITP (idiopathic thrombocytopenic purpura) 08/29/2013  . Leukocytosis, unspecified 09/05/2013  . Muscle cramp 11/16/2013  . Tachycardia    occ upon exersion, and when put on bp med     Past Surgical History:  Procedure Laterality Date  . ABDOMINAL HYSTERECTOMY    . ANTERIOR LAT LUMBAR FUSION N/A 03/28/2014   Procedure: Anterolateral decompression of L1 fracture with posterior segmental stabilization;  Surgeon: Kristeen Miss, MD;  Location: [facility];  Service: Neurosurgery;  Laterality: N/A;  Anterolateral decompression of Lumbar One fracture with posterior segmental stabilization  . CESAREAN SECTION     x2  . CHEST TUBE INSERTION Left 03/28/2014   Procedure: CHEST TUBE INSERTION;  Surgeon: Ivin Poot, MD;  Location: [facility];  Service: Thoracic;  Laterality: Left;  . INCISIONAL HERNIA REPAIR N/A 11/13/2014   Procedure: PRIMARY REPAIR INCARCERATED INCISIONAL HERNIA WITH PLACEMENT BIOLOGIC MESH;  Surgeon: Rolm Bookbinder, MD;  Location: [facility];   Service: General;  Laterality: N/A;  . LAPAROTOMY Right 09   ovarian cyst  . TOTAL ABDOMINAL HYSTERECTOMY W/ BILATERAL SALPINGOOPHORECTOMY  97     Review of Systems:   Review of Systems  Unable to perform ROS: Acuity of condition    Social History   reports that she quit smoking about 5 years ago. Her smoking use included cigarettes. She has a 15.00 pack-year smoking history. She has never used smokeless tobacco. She reports current alcohol use. She reports that she does not use drugs.   Family History   Her family history includes Heart attack in her mother and another family member; Heart disease in her father; Hypertension in her mother and another family member; Leukemia in her paternal uncle.   Allergies No Known Allergies   Home Medications  Prior to Admission medications   Medication Sig Start Date End Date Taking? Authorizing Provider  carvedilol (COREG) 3.125 MG tablet Take 1 tablet (3.125 mg total) by mouth 2 (two) times daily with a meal. 05/18/20   Andrew Au, MD  diclofenac Sodium (VOLTAREN) 1 % GEL Apply 4 g topically 4 (four) times daily as needed (knee pain). 05/18/20   Andrew Au, MD  furosemide (LASIX) 80 MG tablet TAKE 1 TABLET BY MOUTH DAILY; 1 TABLET TWICE DAILY IF YOU GAIN 3 POUNDS IN 1 WEEK OR 5 POUNDS IN 1 DAY Patient taking differently: Take 80 mg by mouth See  admin instructions. Take 80 mg by mouth once a day and increase to 80 mg two times a day if either 3 pounds are gained in a week OR 5 pounds in one day 03/28/20   Harvie Heck, MD  loperamide (IMODIUM) 2 MG capsule Take 1 capsule (2 mg total) by mouth as needed for diarrhea or loose stools. 05/18/20   Andrew Au, MD  potassium chloride (K-DUR) 10 MEQ tablet Take 1 tablet (10 mEq total) by mouth daily. 06/20/17   Caren Griffins, MD  ramelteon (ROZEREM) 8 MG tablet Take 1 tablet (8 mg total) by mouth at bedtime. 05/18/20   Andrew Au, MD     Interim history/subjective:  Comfortable on  BiPAP  Objective   Blood pressure (!) 124/52, pulse (!) 116, temperature 99.4 F (37.4 C), temperature source Oral, resp. rate (!) 22, height 5' (1.524 m), weight 100.6 kg, SpO2 100 %.    Vent Mode: BIPAP FiO2 (%):  [60 %] 60 % Set Rate:  [24 bmp] 24 bmp PEEP:  [8 cmH20] 8 cmH20  No intake or output data in the 24 hours ending 06/29/20 1659 Filed Weights   06/29/20 1530  Weight: 100.6 kg    Examination: Physical Exam Constitutional:      General: She is awake. She is not in acute distress.    Appearance: She is obese.     Comments: Poorly groomed   HENT:     Mouth/Throat:     Lips: Pink.     Mouth: Mucous membranes are moist.     Comments: BiPAP mask in place with good seal.  Cardiovascular:     Rate and Rhythm: Normal rate and regular rhythm.     Heart sounds: Normal heart sounds.  Pulmonary:     Breath sounds: Wheezing present.  Abdominal:     Palpations: Abdomen is soft.     Comments: Protuberant.   Musculoskeletal:        General: Swelling present.     Comments: Bandaged bilateral lower extremities. Edema.  Skin:    General: Skin is warm.  Neurological:     General: No focal deficit present.     Comments: Coarse tremor  Psychiatric:        Behavior: Behavior is cooperative.      Ancillary tests (personally reviewed)  CBC: No results for input(s): WBC, NEUTROABS, HGB, HCT, MCV, PLT in the last 168 hours.  Basic Metabolic Panel: Recent Labs  Lab 06/29/20 1427  NA 137  K 4.2  CL 96*  CO2 30  GLUCOSE 82  BUN 15  CREATININE 0.66  CALCIUM 8.4*   GFR: Estimated Creatinine Clearance: 72.7 mL/min (by C-G formula based on SCr of 0.66 mg/dL). Recent Labs  Lab 06/29/20 1427 06/29/20 1556  LATICACIDVEN 0.7 2.1*    Liver Function Tests: Recent Labs  Lab 06/29/20 1427  AST 31  ALT 24  ALKPHOS 83  BILITOT 1.2  PROT 5.7*  ALBUMIN 3.3*   No results for input(s): LIPASE, AMYLASE in the last 168 hours. No results for input(s): AMMONIA in the last  168 hours.    Coagulation Profile: Recent Labs  Lab 06/29/20 1427  INR 1.0    Cardiac Enzymes: No results for input(s): CKTOTAL, CKMB, CKMBINDEX, TROPONINI in the last 168 hours.  HbA1C: Hgb A1c MFr Bld  Date/Time Value Ref Range Status  04/19/2020 03:41 AM 6.1 (H) 4.8 - 5.6 % Final    Comment:    (NOTE) Pre diabetes:  5.7%-6.4%  Diabetes:              >6.4%  Glycemic control for   <7.0% adults with diabetes     CBG: No results for input(s): GLUCAP in the last 168 hours.   Assessment & Plan:   Critically ill due to acute on chronic hypoxic and hypercarbic respiratory failure requiring BIPAP support.  - Continue BiPAP - follow ABG and mentation.  - Trial of bronchodilators.   Lactic acidosis due to hypoxia - should correct with BiPAP  COVID-19 pneumonia  - Start steroids, remdesivir - Hold on other immunomodulators given CRP <10  Obesity hypoventilation at baseline with chronic metabolic alkalosis.  Chronic venous statis - Continue diuretics  - Wound care.   Daily Goals Checklist  Pain/Anxiety/Delirium protocol (if indicated): none required VAP protocol (if indicated): not intubated Respiratory support goals: Continue BIPAP, Repeat ABG Blood pressure target: allow autoregulation DVT prophylaxis: Lovenox  Nutritional status and feeding goals: NPO while on BiPAP GI prophylaxis: Not indicated. Fluid status goals: Continue home diuresis Urinary catheter: external catheter Central lines: PIV Glucose control: SSI, monitor for worsening with steroids. Mobility/therapy needs: ad lib Antibiotic de-escalation: continue antibiotics pending PCT results Home medication reconciliation: Continue home medication Daily labs: daily CBC, CMP Code Status: Full  Family Communication: Will update patient's daughter Disposition: ICU   CRITICAL CARE Performed by: Kipp Brood   Total critical care time: 45 minutes  Critical care time was exclusive of  separately billable procedures and treating other patients.  Critical care was necessary to treat or prevent imminent or life-threatening deterioration.  Critical care was time spent personally by me on the following activities: development of treatment plan with patient and/or surrogate as well as nursing, discussions with consultants, evaluation of patient's response to treatment, examination of patient, obtaining history from patient or surrogate, ordering and performing treatments and interventions, ordering and review of laboratory studies, ordering and review of radiographic studies, pulse oximetry, re-evaluation of patient's condition and participation in multidisciplinary rounds.  Kipp Brood, MD [facility] ICU Physician [place]  Pager: [phone] Mobile: [phone] After hours: [phone].

## 2020-06-29 NOTE — Progress Notes (Signed)
eLink Physician-Brief Progress Note Patient Name: [name] DOB: 04/11/1951 MRN: [id number]   Date of Service  06/29/2020  HPI/Events of Note  ABG on 60%/IPAP 12/EPAP 8/Rate 24 = 7.269/75.8/82.   eICU Interventions  Plan: 1. Increase rate to 28.  2. Repeat ABG at 10 PM.     Intervention Category Major Interventions: Hypoxemia - evaluation and management;Respiratory failure - evaluation and management  Parker Wherley Cornelia Copa 06/29/2020, 9:03 PM

## 2020-06-29 NOTE — Progress Notes (Signed)
ABG run on Istat however CO2 >97, Sample sent to lab to run. PH 7.17, >97, 145. ABG shown to CCM (Dr Lynetta Mare). Changes made to bipap to improve ABG. Repeat will be drawn in 2 hours

## 2020-06-29 NOTE — ED Notes (Signed)
Date and time results received: 06/29/20  1632  Test: Lactic  Critical Value: 2.1  Name of Provider Notified: Zackowski  Orders Received? Or Actions Taken?: Orders Received - See Orders for details

## 2020-06-29 NOTE — Progress Notes (Signed)
Pharmacy Antibiotic Note  [name] is a 69 y.o. female admitted on 06/29/2020 with unknown source of sepsis. Pt also admitted with COVID19 pneumonia. Pharmacy has been consulted for vancomycin and cefepime dosing. Pt is also on flagyl. This is day 1 of therapy. Pt is afebrile, LA 0.7, WBC pending.  Plan: Vancomycin 2500mg  IV loading dose x1 Vancomycin 750mg  q12h IV Cefepime 2g IV q8h  Flagyl per primary  Monitor clinical progress, micro data, CBC, and renal function   Height: 5' (152.4 cm) Weight: 100.6 kg (221 lb 12.5 oz) IBW/kg (Calculated) : 45.5  Temp (24hrs), Avg:99.4 F (37.4 C), Min:99.4 F (37.4 C), Max:99.4 F (37.4 C)  Recent Labs  Lab 06/29/20 1427  CREATININE 0.66  LATICACIDVEN 0.7    Estimated Creatinine Clearance: 72.7 mL/min (by C-G formula based on SCr of 0.66 mg/dL).    No Known Allergies  Antimicrobials this admission: Cefepime 10/15> vanc 10/15> Flagyl 10/15>  Microbiology results: 10/15 COVID pos 10/15 flu neg 10/15 BCx ordered 10/15 UCx and UA ordered   Thank you for allowing pharmacy to be a part of this patient's care.  Carolin Guernsey  PGY1 pharmacy resident 06/29/2020 3:44 PM

## 2020-06-29 NOTE — ED Notes (Addendum)
At bedside with provider. Patient Spo2 saturation decrease to 80. Patient HFNC increased to 10L and placed on nrb. Patient also exhibiting increased work of breathing. RT called to bedside.

## 2020-06-29 NOTE — Progress Notes (Signed)
ABG drawn and Called Elink to give them critical values.

## 2020-06-29 NOTE — Progress Notes (Signed)
Placed pt on bipap pending ABG results then adjusted settings based on ABG. CCM MD at bedside and in agreement with bipap and to hold off on intubation for now. Will check ABG again in 2 hours to see if any improvement.

## 2020-06-30 DIAGNOSIS — J9691 Respiratory failure, unspecified with hypoxia: Secondary | ICD-10-CM | POA: Diagnosis not present

## 2020-06-30 DIAGNOSIS — J9690 Respiratory failure, unspecified, unspecified whether with hypoxia or hypercapnia: Secondary | ICD-10-CM | POA: Insufficient documentation

## 2020-06-30 LAB — URINE CULTURE: Culture: NO GROWTH

## 2020-06-30 LAB — I-STAT ARTERIAL BLOOD GAS, ED
Acid-Base Excess: 11 mmol/L — ABNORMAL HIGH (ref 0.0–2.0)
Acid-Base Excess: 7 mmol/L — ABNORMAL HIGH (ref 0.0–2.0)
Bicarbonate: 40 mmol/L — ABNORMAL HIGH (ref 20.0–28.0)
Bicarbonate: 34.6 mmol/L — ABNORMAL HIGH (ref 20.0–28.0)
Calcium, Ion: 1.15 mmol/L (ref 1.15–1.40)
Calcium, Ion: 1.15 mmol/L (ref 1.15–1.40)
HCT: 31 % — ABNORMAL LOW (ref 36.0–46.0)
HCT: 31 % — ABNORMAL LOW (ref 36.0–46.0)
Hemoglobin: 10.5 g/dL — ABNORMAL LOW (ref 12.0–15.0)
Hemoglobin: 10.5 g/dL — ABNORMAL LOW (ref 12.0–15.0)
O2 Saturation: 98 %
O2 Saturation: 93 %
Patient temperature: 99.4
Potassium: 3.3 mmol/L — ABNORMAL LOW (ref 3.5–5.1)
Potassium: 3.5 mmol/L (ref 3.5–5.1)
Sodium: 140 mmol/L (ref 135–145)
Sodium: 142 mmol/L (ref 135–145)
TCO2: 42 mmol/L — ABNORMAL HIGH (ref 22–32)
TCO2: 37 mmol/L — ABNORMAL HIGH (ref 22–32)
pCO2 arterial: 83.4 mmHg (ref 32.0–48.0)
pCO2 arterial: 64.5 mmHg — ABNORMAL HIGH (ref 32.0–48.0)
pH, Arterial: 7.291 — ABNORMAL LOW (ref 7.350–7.450)
pH, Arterial: 7.337 — ABNORMAL LOW (ref 7.350–7.450)
pO2, Arterial: 124 mmHg — ABNORMAL HIGH (ref 83.0–108.0)
pO2, Arterial: 72 mmHg — ABNORMAL LOW (ref 83.0–108.0)

## 2020-06-30 LAB — MRSA PCR SCREENING: MRSA by PCR: NEGATIVE

## 2020-06-30 LAB — PROCALCITONIN: Procalcitonin: <0.1 ng/mL

## 2020-06-30 MED ORDER — ORAL CARE MOUTH RINSE
15.0000 mL | Freq: Two times a day (BID) | OROMUCOSAL | Status: DC
  Administered 2020-06-30 – 2020-07-10 (×14): 15 mL via OROMUCOSAL

## 2020-06-30 MED ORDER — DEXAMETHASONE SODIUM PHOSPHATE 10 MG/ML IJ SOLN
6.0000 mg | INTRAMUSCULAR | Status: DC
  Administered 2020-06-30 – 2020-07-03 (×4): 6 mg via INTRAVENOUS
  Filled 2020-06-30 (×5): qty 1

## 2020-06-30 MED ORDER — IPRATROPIUM-ALBUTEROL 0.5-2.5 (3) MG/3ML IN SOLN
3.0000 mL | RESPIRATORY_TRACT | Status: DC | PRN
  Administered 2020-07-01: 3 mL via RESPIRATORY_TRACT
  Filled 2020-06-30: qty 3

## 2020-06-30 MED ORDER — CHLORHEXIDINE GLUCONATE 0.12 % MT SOLN
15.0000 mL | Freq: Two times a day (BID) | OROMUCOSAL | Status: DC
  Administered 2020-06-30 – 2020-07-10 (×19): 15 mL via OROMUCOSAL
  Filled 2020-06-30 (×14): qty 15

## 2020-06-30 MED ORDER — IPRATROPIUM-ALBUTEROL 20-100 MCG/ACT IN AERS
2.0000 | INHALATION_SPRAY | RESPIRATORY_TRACT | Status: DC
  Administered 2020-06-30 – 2020-07-04 (×20): 2 via RESPIRATORY_TRACT
  Filled 2020-06-30: qty 4

## 2020-06-30 MED ORDER — CHLORHEXIDINE GLUCONATE CLOTH 2 % EX PADS
6.0000 | MEDICATED_PAD | Freq: Every day | CUTANEOUS | Status: DC
  Administered 2020-06-30 – 2020-07-09 (×10): 6 via TOPICAL

## 2020-06-30 NOTE — H&P (Addendum)
NAME:  [name], MRN:  [id number], DOB:  [birth year], LOS: 1 ADMISSION DATE:  06/29/2020, CONSULTATION DATE:  06/29/2020 REFERRING MD:  Rogene Houston _[facility], CHIEF COMPLAINT:  hypoxia   HPI/course in hospital  69 year old woman who was convinced to come into ED by home health RN for oxygen saturations down to the 80's.     Patient is confused and cannot provide reliable collateral history.    Past Medical History   Past Medical History:  Diagnosis Date  . Arthritis    lumbar burst fx, osteoporosis  . Back pain 09/29/2013  . Blood dyscrasia   . Chest pain    denies  . Chronic kidney disease    passed stones spontaneously- 40 yrs. ago   . Depression    states her current situation with activity limitations, she has a low spirit at times.   Marland Kitchen Dysrhythmia    occ tachycardia  . Fracture of vertebra, compression ([place])   . GERD (gastroesophageal reflux disease)    occ  . History of kidney stones   . HTN (hypertension)   . ITP (idiopathic thrombocytopenic purpura) 08/29/2013  . Leukocytosis, unspecified 09/05/2013  . Muscle cramp 11/16/2013  . Tachycardia    occ upon exersion, and when put on bp med     Past Surgical History:  Procedure Laterality Date  . ABDOMINAL HYSTERECTOMY    . ANTERIOR LAT LUMBAR FUSION N/A 03/28/2014   Procedure: Anterolateral decompression of L1 fracture with posterior segmental stabilization;  Surgeon: Kristeen Miss, MD;  Location: [facility];  Service: Neurosurgery;  Laterality: N/A;  Anterolateral decompression of Lumbar One fracture with posterior segmental stabilization  . CESAREAN SECTION     x2  . CHEST TUBE INSERTION Left 03/28/2014   Procedure: CHEST TUBE INSERTION;  Surgeon: Ivin Poot, MD;  Location: [facility];  Service: Thoracic;  Laterality: Left;  . INCISIONAL HERNIA REPAIR N/A 11/13/2014   Procedure: PRIMARY REPAIR INCARCERATED INCISIONAL HERNIA WITH PLACEMENT BIOLOGIC MESH;  Surgeon: Rolm Bookbinder, MD;  Location: [facility];   Service: General;  Laterality: N/A;  . LAPAROTOMY Right 09   ovarian cyst  . TOTAL ABDOMINAL HYSTERECTOMY W/ BILATERAL SALPINGOOPHORECTOMY  97      Interim history/subjective:  More awake, requesting water.   Spoke to daughter who stated that patient began having a cough 5 days ago and a low grade fever.   Objective   Blood pressure (!) 109/53, pulse 95, temperature 99.4 F (37.4 C), temperature source Oral, resp. rate (!) 32, height 5' (1.524 m), weight 100.6 kg, SpO2 92 %.    Vent Mode: BIPAP FiO2 (%):  [60 %-90 %] 60 % Set Rate:  [24 bmp-32 bmp] 32 bmp PEEP:  [8 cmH20] 8 cmH20 Pressure Support:  [8 cmH20] 8 cmH20   Intake/Output Summary (Last 24 hours) at 06/30/2020 1035 Last data filed at 06/29/2020 2244 Gross per 24 hour  Intake 850 ml  Output 650 ml  Net 200 ml   Filed Weights   06/29/20 1530  Weight: 100.6 kg    Examination: Physical Exam Constitutional:      General: She is awake. She is not in acute distress.    Appearance: She is obese.     Comments: Poorly groomed   HENT:     Mouth/Throat:     Lips: Pink.     Mouth: Mucous membranes are moist.     Comments: BiPAP mask in place with good seal.  Cardiovascular:     Rate  and Rhythm: Normal rate and regular rhythm.     Heart sounds: Normal heart sounds.  Pulmonary:     Breath sounds: Rales present.     Comments: Wheezing resolved. Crackles at both bases.  Abdominal:     Palpations: Abdomen is soft.     Comments: Protuberant.   Musculoskeletal:        General: Swelling present.     Comments: Bandaged bilateral lower extremities. Edema.  Skin:    General: Skin is warm.  Neurological:     General: No focal deficit present.     Mental Status: She is oriented to person, place, and time.     Comments: Coarse tremor has resolved and the patient is more awake.  Psychiatric:        Behavior: Behavior is cooperative.      Ancillary tests (personally reviewed)  CBC: Recent Labs  Lab 06/29/20 1540  06/29/20 1817 06/29/20 2033 06/29/20 2231 06/29/20 2249 06/30/20 0123 06/30/20 0952  WBC 6.3  --   --   --  6.2  --   --   NEUTROABS 4.6  --   --   --   --   --   --   HGB 11.3*   < > 10.5* 10.9* 10.6* 10.5* 10.5*  HCT 38.4   < > 31.0* 32.0* 36.4 31.0* 31.0*  MCV 96.5  --   --   --  97.1  --   --   PLT 58*  --   --   --  55*  --   --    < > = values in this interval not displayed.    Basic Metabolic Panel: Recent Labs  Lab 06/29/20 1427 06/29/20 1427 06/29/20 1817 06/29/20 2033 06/29/20 2231 06/29/20 2249 06/30/20 0123 06/30/20 0952  NA 137   < > 138 139 138  --  140 142  K 4.2   < > 3.9 3.9 3.5  --  3.3* 3.5  CL 96*  --   --   --   --   --   --   --   CO2 30  --   --   --   --   --   --   --   GLUCOSE 82  --   --   --   --   --   --   --   BUN 15  --   --   --   --   --   --   --   CREATININE 0.66  --   --   --   --  0.67  --   --   CALCIUM 8.4*  --   --   --   --   --   --   --    < > = values in this interval not displayed.   GFR: Estimated Creatinine Clearance: 72.7 mL/min (by C-G formula based on SCr of 0.67 mg/dL). Recent Labs  Lab 06/29/20 1427 06/29/20 1540 06/29/20 1553 06/29/20 1556 06/29/20 2249  PROCALCITON  --   --  <0.10  --  <0.10  WBC  --  6.3  --   --  6.2  LATICACIDVEN 0.7  --   --  2.1* 0.9    Liver Function Tests: Recent Labs  Lab 06/29/20 1427  AST 31  ALT 24  ALKPHOS 83  BILITOT 1.2  PROT 5.7*  ALBUMIN 3.3*   No results for input(s): LIPASE, AMYLASE in the last 168  hours. No results for input(s): AMMONIA in the last 168 hours.    Coagulation Profile: Recent Labs  Lab 06/29/20 1427  INR 1.0    Cardiac Enzymes: No results for input(s): CKTOTAL, CKMB, CKMBINDEX, TROPONINI in the last 168 hours.  HbA1C: Hgb A1c MFr Bld  Date/Time Value Ref Range Status  04/19/2020 03:41 AM 6.1 (H) 4.8 - 5.6 % Final    Comment:    (NOTE) Pre diabetes:          5.7%-6.4%  Diabetes:              >6.4%  Glycemic control for    <7.0% adults with diabetes     CBG: No results for input(s): GLUCAP in the last 168 hours.   Assessment & Plan:   Critically ill due to acute on chronic hypoxic and hypercarbic respiratory failure requiring BIPAP support. Hypercarbia much improved.  - Continue BiPAP prn and transition to nocturnal BIPAP   - Continue bronchodilators.   Lactic acidosis due to hypoxia - has cleared  Unusual mediastinal silhouette - unchanged from 2015.   COVID-19 pneumonia  - Start steroids, remdesivir - Hold on other immunomodulators given CRP <10  Obesity hypoventilation at baseline with chronic metabolic alkalosis.  Chronic venous statis - Continue diuretics  - Wound care.   Daily Goals Checklist  Pain/Anxiety/Delirium protocol (if indicated): none required VAP protocol (if indicated): not intubated Respiratory support goals: Continue BIPAP prn and transition to QHS Blood pressure target: allow autoregulation DVT prophylaxis: Lovenox  Nutritional status and feeding goals: start clear liquids.  GI prophylaxis: Not indicated. Fluid status goals: Continue home diuresis Urinary catheter: external catheter Central lines: PIV Glucose control: SSI, monitor for worsening with steroids. Mobility/therapy needs: ad lib Antibiotic de-escalation: stop antibiotics given negative PCT and continue steroids Home medication reconciliation: Continue home medication Daily labs: daily CBC, CMP Code Status: Full  Family Communication: Updated daughter and son. Daughter confirmed code status.  Disposition: ICU, will admit to [facility] as no [facility] beds   CRITICAL CARE Performed by: Kipp Brood   Total critical care time: 35 minutes  Critical care time was exclusive of separately billable procedures and treating other patients.  Critical care was necessary to treat or prevent imminent or life-threatening deterioration.  Critical care was time spent personally by me on the following activities:  development of treatment plan with patient and/or surrogate as well as nursing, discussions with consultants, evaluation of patient's response to treatment, examination of patient, obtaining history from patient or surrogate, ordering and performing treatments and interventions, ordering and review of laboratory studies, ordering and review of radiographic studies, pulse oximetry, re-evaluation of patient's condition and participation in multidisciplinary rounds.  Kipp Brood, MD [facility] ICU Physician [place]  Pager: [phone] Mobile: [phone] After hours: [phone].

## 2020-06-30 NOTE — ED Notes (Signed)
Pt's sat's dropping to 85%.  Resp at bedside.  Pt will drift off to sleep, when aroused pt's sat's increase.

## 2020-06-30 NOTE — Progress Notes (Signed)
Transferred patient from room 4 to room 34 on BIPAP without any complications.

## 2020-06-30 NOTE — Progress Notes (Signed)
RT removed BiPAP from pt per MD's request. Pt may use it PRN based on her ABG.

## 2020-06-30 NOTE — Progress Notes (Signed)
Multiple moisture associated wound in skin folds in panis area

## 2020-06-30 NOTE — Progress Notes (Signed)
eLink Physician-Brief Progress Note Patient Name: [name] DOB: 05-07-1951 MRN: [id number]   Date of Service  06/30/2020  HPI/Events of Note  ABG on 90%/IPAP 12/EPAP 8/Rate 32. No real response to increasing the BiPAP rate.   eICU Interventions  Continue to monitor respiratory status on current BiPAP settings.     Intervention Category Major Interventions: Respiratory failure - evaluation and management;Acid-Base disturbance - evaluation and management  Niya Behler [place] 06/30/2020, 1:36 AM

## 2020-06-30 NOTE — ED Notes (Signed)
Attempted report to [employer]. Call back number provided to unit.

## 2020-06-30 NOTE — Progress Notes (Signed)
ABG drawn and called Elink with the critical values.

## 2020-06-30 NOTE — Progress Notes (Signed)
PCCM progress note  Patient examined on arrival at [facility] from [facility] ED In brief this is a 69 year old with acute on chronic hypoxic hypercarbic respiratory failure with COVID-19 pneumonia Hypercarbia improved on BiPAP.  She is currently on 6 L oxygen [baseline 3 L oxygen]  Continue BiPAP as needed going forward Remdesivir, steroids. Hold off on baricitinib or Actemra for now as she is not requiring significant O2 support.  Marshell Garfinkel MD [facility] Please see [email or web address] for pager details.  06/30/2020, 1:11 PM

## 2020-06-30 NOTE — Progress Notes (Addendum)
PT arrived to [facility] 1241 on 6 lpm standard nasal cannula (Sp02 <85). Placed PT on [place] high flow 02 device at 8 LPM with Sp02 88% PT states she uses 3lpm at home and a CPAP. PT states she is breathing ok at this time but agrees to BiPAP (ordered QHS and PRN) when needed. RN at bedside. RN aware.

## 2020-06-30 NOTE — Progress Notes (Addendum)
PT states she is breathing ok at this time. Decreased 02 from 8 LPM to 7 LPM - was 96% on 8 LPM (RN aware). PT agress to [employer] / RT if she becomes sleepy and or feels like she is short of breath.

## 2020-07-01 DIAGNOSIS — J9691 Respiratory failure, unspecified with hypoxia: Secondary | ICD-10-CM | POA: Diagnosis not present

## 2020-07-01 LAB — COMPREHENSIVE METABOLIC PANEL WITH GFR
ALT: 17 U/L (ref 0–44)
AST: 22 U/L (ref 15–41)
Albumin: 3 g/dL — ABNORMAL LOW (ref 3.5–5.0)
Alkaline Phosphatase: 59 U/L (ref 38–126)
Anion gap: 11 (ref 5–15)
BUN: 23 mg/dL (ref 8–23)
CO2: 34 mmol/L — ABNORMAL HIGH (ref 22–32)
Calcium: 8.2 mg/dL — ABNORMAL LOW (ref 8.9–10.3)
Chloride: 96 mmol/L — ABNORMAL LOW (ref 98–111)
Creatinine, Ser: 0.52 mg/dL (ref 0.44–1.00)
GFR, Estimated: >60 mL/min
Glucose, Bld: 109 mg/dL — ABNORMAL HIGH (ref 70–99)
Potassium: 3.9 mmol/L (ref 3.5–5.1)
Sodium: 141 mmol/L (ref 135–145)
Total Bilirubin: 0.9 mg/dL (ref 0.3–1.2)
Total Protein: 5.3 g/dL — ABNORMAL LOW (ref 6.5–8.1)

## 2020-07-01 LAB — CBC
HCT: 36.7 % (ref 36.0–46.0)
Hemoglobin: 11.2 g/dL — ABNORMAL LOW (ref 12.0–15.0)
MCH: 29.9 pg (ref 26.0–34.0)
MCHC: 30.5 g/dL (ref 30.0–36.0)
MCV: 97.9 fL (ref 80.0–100.0)
Platelets: 59 K/uL — ABNORMAL LOW (ref 150–400)
RBC: 3.75 MIL/uL — ABNORMAL LOW (ref 3.87–5.11)
RDW: 16.2 % — ABNORMAL HIGH (ref 11.5–15.5)
WBC: 3.9 K/uL — ABNORMAL LOW (ref 4.0–10.5)
nRBC: 0 % (ref 0.0–0.2)

## 2020-07-01 LAB — PROCALCITONIN: Procalcitonin: <0.1 ng/mL

## 2020-07-01 MED ORDER — ACETAMINOPHEN 325 MG PO TABS
650.0000 mg | ORAL_TABLET | Freq: Four times a day (QID) | ORAL | Status: DC | PRN
  Administered 2020-07-01 – 2020-07-10 (×5): 650 mg via ORAL
  Filled 2020-07-01 (×5): qty 2

## 2020-07-01 MED ORDER — LIP MEDEX EX OINT
TOPICAL_OINTMENT | CUTANEOUS | Status: DC | PRN
  Filled 2020-07-01: qty 7

## 2020-07-01 MED ORDER — ENOXAPARIN SODIUM 40 MG/0.4ML ~~LOC~~ SOLN
40.0000 mg | SUBCUTANEOUS | Status: DC
  Administered 2020-07-01 – 2020-07-09 (×8): 40 mg via SUBCUTANEOUS
  Filled 2020-07-01 (×8): qty 0.4

## 2020-07-01 MED ORDER — PNEUMOCOCCAL VAC POLYVALENT 25 MCG/0.5ML IJ INJ
0.5000 mL | INJECTION | INTRAMUSCULAR | Status: DC
  Filled 2020-07-01: qty 0.5

## 2020-07-01 MED ORDER — INFLUENZA VAC A&B SA ADJ QUAD 0.5 ML IM PRSY
0.5000 mL | PREFILLED_SYRINGE | INTRAMUSCULAR | Status: DC
  Filled 2020-07-01: qty 0.5

## 2020-07-01 NOTE — Progress Notes (Signed)
   NAME:  [name], MRN:  [id number], DOB:  [birth year], LOS: 2 ADMISSION DATE:  06/29/2020, CONSULTATION DATE:  06/29/2020 REFERRING MD:  Rogene Houston _[facility], CHIEF COMPLAINT:  hypoxia  Brief History   69 year old with ITP, OSA/OHS and chronic respiratory failure on home O2, chronic pain Admitted with hypoxic, hypercarbic respiratory failure, COVID-19 pneumonia.  Treated with BiPAP and admitted to ICU.  Past Medical History    has a past medical history of Arthritis, Back pain (09/29/2013), Blood dyscrasia, Chest pain, Chronic kidney disease, Depression, Dysrhythmia, Fracture of vertebra, compression ([facility]), GERD (gastroesophageal reflux disease), History of kidney stones, HTN (hypertension), ITP (idiopathic thrombocytopenic purpura) (08/29/2013), Leukocytosis, unspecified (09/05/2013), Muscle cramp (11/16/2013), and Tachycardia.  Significant Hospital Events   10/15-admit 10/16-transferred to [facility] due to scarcity of beds at [facility].  Off BiPAP  Consults:  PCCM  Procedures:    Significant Diagnostic Tests:    Micro Data/COVID treatment  Remdesivir 10/15 >> Decadron 10/15 >>  Antimicrobials:  MRSA PCR 10/16-negative  Interim history/subjective:   On BiPAP at night Transition to nasal cannula today morning. No complaints  Objective   Blood pressure (!) 106/42, pulse 98, temperature 98 F (36.7 C), temperature source Oral, resp. rate (!) 22, height 5' (1.524 m), weight 95.6 kg, SpO2 96 %.    Vent Mode: BIPAP FiO2 (%):  [60 %] 60 % Set Rate:  [32 bmp] 32 bmp PEEP:  [5 cmH20] 5 cmH20 Pressure Support:  [5 cmH20] 5 cmH20   Intake/Output Summary (Last 24 hours) at 07/01/2020 1019 Last data filed at 07/01/2020 0500 Gross per 24 hour  Intake 1772.55 ml  Output 3200 ml  Net -1427.45 ml   Filed Weights   06/29/20 1530 07/01/20 0459  Weight: 100.6 kg 95.6 kg    Examination: Gen:      No acute distress HEENT:  EOMI, sclera anicteric Neck:     No masses; no  thyromegaly Lungs:    Clear to auscultation bilaterally; normal respiratory effort CV:         Regular rate and rhythm; no murmurs Abd:      + bowel sounds; soft, non-tender; no palpable masses, no distension Ext:    No edema; adequate peripheral perfusion Skin:      Warm and dry; no rash Neuro: alert and oriented x 3 Psych: normal mood and affect  Labs/imaging reviewed Significant for BUN/creatinine 23/0.52, WBC 3.9, hemoglobin 11.2 No new imaging  Resolved Hospital Problem list     Assessment & Plan:  COVID-19 pneumonia Acute on chronic hypoxic, hypercarbic respiratory failure Baseline OSA/OHS on home O2 BiPAP as needed and mandatory at night Continue remdesivir and steroids Holding off on anticytokine agents as CRP is low and she is getting close to her baseline O2 levels  Chronic ITP Follow CBC  Hypertension Continue Coreg, Lasix  Best practice:  Diet: PO Pain/Anxiety/Delirium protocol (if indicated): NA VAP protocol (if indicated): NA DVT prophylaxis: Heparin subcu GI prophylaxis: NA Glucose control: [place] Mobility: Bed Code Status: Full Family Communication: Patient updated Disposition: Transfer to SDU. To TRH and PCCM off 10/18  Critical care time:    Marshell Garfinkel MD [facility] Please see [email or web address] for pager details.  07/01/2020, 10:50 AM

## 2020-07-02 DIAGNOSIS — J9601 Acute respiratory failure with hypoxia: Secondary | ICD-10-CM | POA: Diagnosis not present

## 2020-07-02 DIAGNOSIS — U071 COVID-19: Principal | ICD-10-CM

## 2020-07-02 DIAGNOSIS — G4733 Obstructive sleep apnea (adult) (pediatric): Secondary | ICD-10-CM

## 2020-07-02 DIAGNOSIS — E662 Morbid (severe) obesity with alveolar hypoventilation: Secondary | ICD-10-CM | POA: Diagnosis not present

## 2020-07-02 DIAGNOSIS — J1282 Pneumonia due to coronavirus disease 2019: Secondary | ICD-10-CM

## 2020-07-02 DIAGNOSIS — J9602 Acute respiratory failure with hypercapnia: Secondary | ICD-10-CM

## 2020-07-02 LAB — COMPREHENSIVE METABOLIC PANEL
ALT: 16 U/L (ref 0–44)
AST: 14 U/L — ABNORMAL LOW (ref 15–41)
Albumin: 3.1 g/dL — ABNORMAL LOW (ref 3.5–5.0)
Alkaline Phosphatase: 58 U/L (ref 38–126)
Anion gap: 9 (ref 5–15)
BUN: 24 mg/dL — ABNORMAL HIGH (ref 8–23)
CO2: 42 mmol/L — ABNORMAL HIGH (ref 22–32)
Calcium: 8.1 mg/dL — ABNORMAL LOW (ref 8.9–10.3)
Chloride: 91 mmol/L — ABNORMAL LOW (ref 98–111)
Creatinine, Ser: 0.5 mg/dL (ref 0.44–1.00)
GFR, Estimated: >60 mL/min (ref >60)
Glucose, Bld: 138 mg/dL — ABNORMAL HIGH (ref 70–99)
Potassium: 2.9 mmol/L — ABNORMAL LOW (ref 3.5–5.1)
Sodium: 142 mmol/L (ref 135–145)
Total Bilirubin: 1 mg/dL (ref 0.3–1.2)
Total Protein: 5.3 g/dL — ABNORMAL LOW (ref 6.5–8.1)

## 2020-07-02 LAB — CBC
HCT: 35 % — ABNORMAL LOW (ref 36.0–46.0)
Hemoglobin: 10.6 g/dL — ABNORMAL LOW (ref 12.0–15.0)
MCH: 29.1 pg (ref 26.0–34.0)
MCHC: 30.3 g/dL (ref 30.0–36.0)
MCV: 96.2 fL (ref 80.0–100.0)
Platelets: 51 10*3/uL — ABNORMAL LOW (ref 150–400)
RBC: 3.64 MIL/uL — ABNORMAL LOW (ref 3.87–5.11)
RDW: 15.3 % (ref 11.5–15.5)
WBC: 3 10*3/uL — ABNORMAL LOW (ref 4.0–10.5)
nRBC: 0 % (ref 0.0–0.2)

## 2020-07-02 LAB — PHOSPHORUS
Phosphorus: 1.4 mg/dL — ABNORMAL LOW (ref 2.5–4.6)
Phosphorus: 3.6 mg/dL (ref 2.5–4.6)

## 2020-07-02 LAB — POTASSIUM: Potassium: 3.6 mmol/L (ref 3.5–5.1)

## 2020-07-02 LAB — MAGNESIUM
Magnesium: 2 mg/dL (ref 1.7–2.4)
Magnesium: 1.7 mg/dL (ref 1.7–2.4)

## 2020-07-02 MED ORDER — NYSTATIN 100000 UNIT/GM EX POWD
Freq: Two times a day (BID) | CUTANEOUS | Status: DC
  Filled 2020-07-02 (×2): qty 15

## 2020-07-02 MED ORDER — ASCORBIC ACID 500 MG PO TABS
500.0000 mg | ORAL_TABLET | Freq: Every day | ORAL | Status: DC
  Administered 2020-07-02 – 2020-07-10 (×9): 500 mg via ORAL
  Filled 2020-07-02 (×9): qty 1

## 2020-07-02 MED ORDER — POTASSIUM CHLORIDE CRYS ER 20 MEQ PO TBCR
20.0000 meq | EXTENDED_RELEASE_TABLET | Freq: Once | ORAL | Status: AC
  Administered 2020-07-02: 20 meq via ORAL
  Filled 2020-07-02: qty 1

## 2020-07-02 MED ORDER — POTASSIUM PHOSPHATES 15 MMOLE/5ML IV SOLN
40.0000 mmol | Freq: Once | INTRAVENOUS | Status: AC
  Administered 2020-07-02: 40 mmol via INTRAVENOUS
  Filled 2020-07-02: qty 13.33

## 2020-07-02 MED ORDER — ZINC SULFATE 220 (50 ZN) MG PO CAPS
220.0000 mg | ORAL_CAPSULE | Freq: Every day | ORAL | Status: DC
  Administered 2020-07-02 – 2020-07-10 (×9): 220 mg via ORAL
  Filled 2020-07-02 (×9): qty 1

## 2020-07-02 MED ORDER — LORAZEPAM 2 MG/ML IJ SOLN
1.0000 mg | Freq: Once | INTRAMUSCULAR | Status: AC
  Administered 2020-07-02: 1 mg via INTRAVENOUS
  Filled 2020-07-02: qty 1

## 2020-07-02 NOTE — TOC Initial Note (Addendum)
Transition of Care [facility]) - Initial/Assessment Note    Patient Details  Name: [name] MRN: [id number] Date of Birth: [birth year]  [facility]) CM/SW Contact:    Leeroy Cha, RN Phone Number: 07/02/2020, 8:58 AM  Clinical Narrative:                 69 year old woman who was convinced to come into ED by home health RN for oxygen saturations down to the 80's. o2 at 4l/[place], desats down to 87%, iv decadron, iv lasix 80mg qday, iv remdesivir through 10/20 covid positive tcf-[facility]/pt is uncooperative and noncompliant, will not ask family members that live with her for any assistance in care.  Has declined hospice care. Patient is alert and reactive. Aide that sees patient in the home does state that the house is "cluttered". Patient refuses to use hoyer lift and it is sitting on the front porch. Family does assist with transportation but there may be a need for transport. Ambulates at homje with the use of a walker.  Has lift chair and sleeps in the lift chair. [facility] has considered calling aps but since patient is able to make reasonable decisions the agency would not impact the living situation. Expected Discharge Plan: [place] Barriers to Discharge: Barriers Unresolved (comment)   Patient Goals and CMS Choice Patient states their goals for this hospitalization and ongoing recovery are:: to go back home CMS Medicare.gov Compare Post Acute Care list provided to:: Patient    Expected Discharge Plan and Services Expected Discharge Plan: [place]   Discharge Planning Services: CM Consult   Living arrangements for the past 2 months: Single Family Home                                      Prior Living Arrangements/Services Living arrangements for the past 2 months: Single Family Home Lives with:: Self Patient language and need for interpreter reviewed:: Yes Do you feel safe going back  to the place where you live?: Yes      Need for Family Participation in Patient Care: Yes (Comment) Care giver support system in place?: Yes (comment)   Criminal Activity/Legal Involvement Pertinent to Current Situation/Hospitalization: No - Comment as needed  Activities of Daily Living      Permission Sought/Granted                  Emotional Assessment Appearance:: Appears stated age Attitude/Demeanor/Rapport: Engaged Affect (typically observed): Calm Orientation: : Oriented to Self, Oriented to Place, Oriented to  Time, Oriented to Situation Alcohol / Substance Use: Not Applicable Psych Involvement: No (comment)  Admission diagnosis:  Respiratory failure ([place]) [J96.90] Hypoxia [R09.02] Respiratory failure with hypoxia ([place]) [J96.91] COVID [U07.1] Patient Active Problem List   Diagnosis Date Noted  . Respiratory failure ([place]) 06/30/2020  . Respiratory failure with hypoxia ([place]) 06/29/2020  . Shock ([place]) 05/14/2020  . Chronic, continuous use of opioids 05/14/2020  . Opioid overdose ([place]) 05/14/2020  . Acute on chronic respiratory failure with hypoxia and hypercapnia ([place]) 05/14/2020  . Pressure injury of skin 04/22/2020  . Sepsis due to cellulitis ([place]) 04/18/2020  . Venous stasis dermatitis of both lower extremities 02/25/2019  . Encounter for preventive care 10/20/2018  . Chronic respiratory failure with hypoxia and hypercapnia ([place]) 10/06/2018  . Polycythemia, secondary 10/05/2018  . Obesity hypoventilation  syndrome ([place]) 10/04/2018  . AKI (acute kidney injury) ([place])   . BMI 40.0-44.9, adult ([place])   . Bilateral lower extremity edema   . Venous stasis dermatitis 09/30/2018  . Wound of lower extremity, bilateral 09/30/2018  . Chronic heart failure with preserved ejection fraction with moderate RV failure 06/09/2017  . GERD (gastroesophageal reflux disease) 11/13/2014  . Opiate-induced constipation 11/13/2014  . History of tobacco abuse 03/10/2014  . Chronic  back pain secondary to traumatic L1 fracture 09/29/2013  . Chronic idiopathic thrombocytopenia ([place]) 08/29/2013  . HTN (hypertension)    PCP:  [name], MD Pharmacy:   [facility] [place], [place] - [address] AT [place] [address] Phone: [phone] Fax: [phone]     Social Determinants of Health (SDOH) Interventions    Readmission Risk Interventions No flowsheet data found.

## 2020-07-02 NOTE — Progress Notes (Signed)
PROGRESS NOTE    [name]  VHQ:[id number] DOB: [birth year] DOA: 06/29/2020 PCP: [name], MD   Brief Narrative:  69 year old WF PMHx Depression, ITP, OSA/OHS and Chronic Respiratory Failure on home 3 L O2 , chronic pain, Essential HTN,  Admitted with hypoxic, hypercarbic respiratory failure, COVID-19 pneumonia.  Treated with BiPAP and admitted to ICU.    Subjective: Afebrile overnight A/O x4, patient states on 3 L O2 at home.  Although patient obese never been diagnosed with OSA/OHS per patient.   Assessment & Plan:  Covid vaccination; unvaccinated  Active Problems:   Respiratory failure with hypoxia ([place])   Respiratory failure ([place])  Acute on chronic hypoxic, hypercarbic respiratory failure -See Covid pneumonia  Baseline OSA/OHS on home O2 -Per PCCM BiPAP is mandatory at night  COVID-19 pneumonia COVID-19 Labs  Recent Labs    06/29/20 1553  DDIMER 0.44  FERRITIN 163  LDH 200*  CRP 2.5*    Lab Results  Component Value Date   SARSCOV2NAA POSITIVE (A) 06/29/2020   [place] NEGATIVE 05/14/2020   [place] NEGATIVE 04/29/2020   [place] NEGATIVE 04/18/2020  -Decadron 6 mg daily -Remdesivir x5-day per pharmacy protocol -Respimat QID -Vitamin C and zinc per Covid protocol -Flutter valve -Incentive spirometry -Titrate O2 to maintain SPO2> 88% -Prone patient for 8 hours/day; if patient cannot tolerate prone 2 to 3 hours per shift  Chronic ITP Follow CBC  Hypertension Continue Coreg, Lasix  Hypokalemia -Potassium goal> 4 -K-Phos 40 mmol + K-Dur 20 mEq -Repeat K/Mg/PO4 @1500   Phosphorus -Phosphorus goal> 2.5 -See hypokalemia   DVT prophylaxis: Lovenox Code Status: Full Family Communication:  Status is: Inpatient    Dispo: The patient is from: Home              Anticipated d/c is to: Home              Anticipated d/c date is: 10/22              Patient currently unstable      Consultants:   PCCM    Procedures/Significant Events:  10/15-admit 10/16-transferred to [facility] due to scarcity of beds at [facility].  Off BiPAP   I have personally reviewed and interpreted all radiology studies and my findings are as above.  VENTILATOR SETTINGS: Nasal cannula 10/18 Flow 4 L/min SPO2 98%   Cultures 10/15 SARS coronavirus positive 10/15 influenza A/B negative 10/15 urine negative 10/16 MRSA by PCR negative  Antimicrobials: Anti-infectives (From admission, onward)   Start     Ordered Stop   06/30/20 1000  remdesivir 100 mg in sodium chloride 0.9 % 100 mL IVPB       "Followed by" Linked Group Details   06/29/20 1727 07/04/20 0959   06/30/20 0200  vancomycin (VANCOREADY) IVPB 750 mg/150 mL  Status:  Discontinued        06/29/20 1543 06/30/20 1000   06/29/20 2200  ceFEPIme (MAXIPIME) 2 g in sodium chloride 0.9 % 100 mL IVPB  Status:  Discontinued        06/29/20 1543 06/30/20 1000   06/29/20 2200  vancomycin (VANCOREADY) IVPB 750 mg/150 mL  Status:  Discontinued        06/29/20 1543 06/29/20 1544   06/29/20 1730  remdesivir 200 mg in sodium chloride 0.9% 250 mL IVPB       "Followed by" Linked Group Details   06/29/20 1727 06/29/20 2038   06/29/20 1430  ceFEPIme (MAXIPIME) 2 g in sodium chloride 0.9 % 100 mL IVPB  06/29/20 1420 06/29/20 1609   06/29/20 1430  metroNIDAZOLE (FLAGYL) IVPB 500 mg        06/29/20 1420 06/29/20 1700   06/29/20 1430  vancomycin (VANCOCIN) IVPB 1000 mg/200 mL premix  Status:  Discontinued        06/29/20 1420 06/29/20 1423   06/29/20 1430  vancomycin (VANCOREADY) IVPB 2000 mg/400 mL  Status:  Discontinued        06/29/20 1423 06/29/20 1424   06/29/20 1430  vancomycin (VANCOCIN) 2,500 mg in sodium chloride 0.9 % 500 mL IVPB        06/29/20 1424 06/29/20 2038        Devices    LINES / TUBES:      Continuous Infusions: . sodium chloride 10 mL/hr at 06/30/20 1800  . remdesivir 100 mg in NS 100 mL Stopped (07/01/20 1204)      Objective: Vitals:   07/02/20 0300 07/02/20 0400 07/02/20 0415 07/02/20 0500  BP: (!) 151/63 (!) 153/70  (!) 150/61  Pulse: 82 79  85  Resp: (!) 23 (!) 21  18  Temp:  97.8 F (36.6 C)    TempSrc:  Oral    SpO2: 97% 95% (!) 87% 100%  Weight:      Height:        Intake/Output Summary (Last 24 hours) at 07/02/2020 0759 Last data filed at 07/02/2020 0300 Gross per 24 hour  Intake 918.2 ml  Output 1800 ml  Net -881.8 ml   Filed Weights   06/29/20 1530 07/01/20 0459  Weight: 100.6 kg 95.6 kg    Examination:  General: A/O x4, positive acute on chronic respiratory distress Eyes: negative scleral hemorrhage, negative anisocoria, negative icterus ENT: Negative Runny nose, negative gingival bleeding, Neck:  Negative scars, masses, torticollis, lymphadenopathy, JVD Lungs: decreased breath sounds bilaterally, positive diffuse expiratory wheezes, negative crackles Cardiovascular: Regular rate and rhythm without murmur gallop or rub normal S1 and S2 Abdomen: MORBIDLY OBESE, negative abdominal pain, nondistended, positive soft, bowel sounds, no rebound, no ascites, no appreciable mass Extremities: No significant cyanosis, clubbing, or edema bilateral lower extremities Skin: Negative rashes, lesions, ulcers Psychiatric:  Negative depression, negative anxiety, negative fatigue, negative mania, not entirely sure patient cognition is at baseline told me she does not have OSA/OHS however PCCM emphatic that patient CANNOT be without BiPAP at night. Central nervous system:  Cranial nerves II through XII intact, tongue/uvula midline, all extremities muscle strength 5/5, sensation intact throughout, negative dysarthria, negative expressive aphasia, negative receptive aphasia.  .     Data Reviewed: Care during the described time interval was provided by me .  I have reviewed this patient's available data, including medical history, events of note, physical examination, and all test results  as part of my evaluation.   CBC: Recent Labs  Lab 06/29/20 1540 06/29/20 1817 06/29/20 2249 06/30/20 0123 06/30/20 0952 07/01/20 0823 07/02/20 0653  WBC 6.3  --  6.2  --   --  3.9* 3.0*  NEUTROABS 4.6  --   --   --   --   --   --   HGB 11.3*   < > 10.6* 10.5* 10.5* 11.2* 10.6*  HCT 38.4   < > 36.4 31.0* 31.0* 36.7 35.0*  MCV 96.5  --  97.1  --   --  97.9 96.2  PLT 58*  --  55*  --   --  59* 51*   < > = values in this interval not displayed.   Basic Metabolic  Panel: Recent Labs  Lab 06/29/20 1427 06/29/20 1817 06/29/20 2231 06/29/20 2249 06/30/20 0123 06/30/20 0952 07/01/20 0823 07/02/20 0653  NA 137   < > 138  --  140 142 141 142  K 4.2   < > 3.5  --  3.3* 3.5 3.9 2.9*  CL 96*  --   --   --   --   --  96* 91*  CO2 30  --   --   --   --   --  34* 42*  GLUCOSE 82  --   --   --   --   --  109* 138*  BUN 15  --   --   --   --   --  23 24*  CREATININE 0.66  --   --  0.67  --   --  0.52 0.50  CALCIUM 8.4*  --   --   --   --   --  8.2* 8.1*  MG  --   --   --   --   --   --   --  2.0  PHOS  --   --   --   --   --   --   --  1.4*   < > = values in this interval not displayed.   GFR: Estimated Creatinine Clearance: 70.6 mL/min (by C-G formula based on SCr of 0.5 mg/dL). Liver Function Tests: Recent Labs  Lab 06/29/20 1427 07/01/20 0823 07/02/20 0653  AST 31 22 14*  ALT 24 17 16   ALKPHOS 83 59 58  BILITOT 1.2 0.9 1.0  PROT 5.7* 5.3* 5.3*  ALBUMIN 3.3* 3.0* 3.1*   No results for input(s): LIPASE, AMYLASE in the last 168 hours. No results for input(s): AMMONIA in the last 168 hours. Coagulation Profile: Recent Labs  Lab 06/29/20 1427  INR 1.0   Cardiac Enzymes: No results for input(s): CKTOTAL, CKMB, CKMBINDEX, TROPONINI in the last 168 hours. BNP (last 3 results) No results for input(s): PROBNP in the last 8760 hours. HbA1C: No results for input(s): HGBA1C in the last 72 hours. CBG: No results for input(s): GLUCAP in the last 168 hours. Lipid  Profile: Recent Labs    06/29/20 1553  TRIG 229*   Thyroid Function Tests: No results for input(s): TSH, T4TOTAL, FREET4, T3FREE, THYROIDAB in the last 72 hours. Anemia Panel: Recent Labs    06/29/20 1553  FERRITIN 163   Urine analysis:    Component Value Date/Time   COLORURINE YELLOW 06/29/2020 1427   APPEARANCEUR CLEAR 06/29/2020 1427   LABSPEC 1.013 06/29/2020 1427   PHURINE 5.0 06/29/2020 1427   GLUCOSEU NEGATIVE 06/29/2020 1427   HGBUR NEGATIVE 06/29/2020 1427   BILIRUBINUR NEGATIVE 06/29/2020 1427   KETONESUR 5 (A) 06/29/2020 1427   PROTEINUR 30 (A) 06/29/2020 1427   UROBILINOGEN 0.2 11/13/2014 0233   NITRITE NEGATIVE 06/29/2020 1427   LEUKOCYTESUR NEGATIVE 06/29/2020 1427   Sepsis Labs: @LABRCNTIP (procalcitonin:4,lacticidven:4)  ) Recent Results (from the past 240 hour(s))  Respiratory Panel by RT PCR (Flu A&B, Covid) - Nasopharyngeal Swab     Status: Abnormal   Collection Time: 06/29/20  2:27 PM   Specimen: Nasopharyngeal Swab  Result Value Ref Range Status   SARS Coronavirus 2 by RT PCR POSITIVE (A) NEGATIVE Final    Comment: emailed L. Berdik RN 15:45 06/29/20 ([email or web address]) (NOTE) SARS-CoV-2 target nucleic acids are DETECTED.  SARS-CoV-2 RNA is generally detectable in upper respiratory specimens  during the acute phase of  infection. Positive results are indicative of the presence of the identified virus, but do not rule out bacterial infection or co-infection with other pathogens not detected by the test. Clinical correlation with patient history and other diagnostic information is necessary to determine patient infection status. The expected result is Negative.  Fact Sheet for Patients:  PinkCheek.be  Fact Sheet for Healthcare Providers: GravelBags.it  This test is not yet approved or cleared by the [place] FDA and  has been authorized for detection and/or diagnosis of SARS-CoV-2 by FDA  under an Emergency Use Authorization (EUA).  This EUA will remain in effect (meaning this test can be used) for the duration of  the COVID-19  declaration under Section 564(b)(1) of the Act, 21 U.S.C. section 360bbb-3(b)(1), unless the authorization is terminated or revoked sooner.      Influenza A by PCR NEGATIVE NEGATIVE Final   Influenza B by PCR NEGATIVE NEGATIVE Final    Comment: (NOTE) The Xpert Xpress SARS-CoV-2/FLU/RSV assay is intended as an aid in  the diagnosis of influenza from Nasopharyngeal swab specimens and  should not be used as a sole basis for treatment. Nasal washings and  aspirates are unacceptable for Xpert Xpress SARS-CoV-2/FLU/RSV  testing.  Fact Sheet for Patients: PinkCheek.be  Fact Sheet for Healthcare Providers: GravelBags.it  This test is not yet approved or cleared by the [place] FDA and  has been authorized for detection and/or diagnosis of SARS-CoV-2 by  FDA under an Emergency Use Authorization (EUA). This EUA will remain  in effect (meaning this test can be used) for the duration of the  Covid-19 declaration under Section 564(b)(1) of the Act, 21  U.S.C. section 360bbb-3(b)(1), unless the authorization is  terminated or revoked. Performed at [facility], [place] [address] [place], [place] [address]   Urine culture     Status: None   Collection Time: 06/29/20  2:28 PM   Specimen: In/Out Cath Urine  Result Value Ref Range Status   Specimen Description IN/OUT CATH URINE  Final   Special Requests NONE  Final   Culture   Final    NO GROWTH Performed at [facility], [place] [address] [place], [place] [address]    Report Status 06/30/2020 FINAL  Final  MRSA PCR Screening     Status: None   Collection Time: 06/30/20 12:50 PM   Specimen: Nasal Mucosa; Nasopharyngeal  Result Value Ref Range Status   MRSA by PCR NEGATIVE NEGATIVE Final    Comment:        The GeneXpert MRSA  Assay (FDA approved for NASAL specimens only), is one component of a comprehensive MRSA colonization surveillance program. It is not intended to diagnose MRSA infection nor to guide or monitor treatment for MRSA infections. Performed at [facility], [place] [address], [place], [place] [address]          Radiology Studies: No results found.      Scheduled Meds: . carvedilol  3.125 mg Oral BID WC  . chlorhexidine  15 mL Mouth Rinse BID  . Chlorhexidine Gluconate Cloth  6 each Topical Daily  . dexamethasone (DECADRON) injection  6 mg Intravenous Q24H  . enoxaparin (LOVENOX) injection  40 mg Subcutaneous Q24H  . furosemide  80 mg Intravenous Daily  . influenza vaccine adjuvanted  0.5 mL Intramuscular Tomorrow-1000  . Ipratropium-Albuterol  2 puff Inhalation Q4H  . mouth rinse  15 mL Mouth Rinse q12n4p  . pneumococcal 23 valent vaccine  0.5 mL  Intramuscular Tomorrow-1000   Continuous Infusions: . sodium chloride 10 mL/hr at 06/30/20 1800  . remdesivir 100 mg in NS 100 mL Stopped (07/01/20 1204)     LOS: 3 days   The patient is critically ill with multiple organ systems failure and requires high complexity decision making for assessment and support, frequent evaluation and titration of therapies, application of advanced monitoring technologies and extensive interpretation of multiple databases. Critical Care Time devoted to patient care services described in this note  Time spent: 40 minutes     Shraddha Lebron, Geraldo Docker, MD [facility] Pager [phone]  If 7PM-7AM, please contact night-coverage [email or web address] Password [email or web address] 07/02/2020, 7:59 AM

## 2020-07-02 NOTE — Consult Note (Signed)
WOC Nurse Consult Note: Reason for Consult:Chronic skin changes to bilateral lower legs.  Wears weekly compression at home.   Wound type:chronic skin changes from venous insufficiency.  Anterior lower legs with some erythema noted.  Pending referral to wound care center due to caregiver concerns.  Pressure Injury POA: NA Measurement: circumferential dry plaques to lower legs in gaiter areas.  Wound ERQ:SXQKSK dry skin Drainage (amount, consistency, odor) none noted  Periwound:edema, chronic skin changes and hemosiderin staining.  Dressing procedure/placement/frequency: Bedside RN to perform. Cleanse bilateral lower legs with soap and water and pat dry. Apply Xeroform gauze to open wounds.  Wrap open wounds to legs with Xeroform and kerlix from below toes to below knee.  Secure with ace bandage.  Change daily.  Will not follow at this time.  Please re-consult if needed.  Domenic Moras MSN, RN, FNP-BC CWON Wound, Ostomy, Continence Nurse Pager [phone]

## 2020-07-03 DIAGNOSIS — J9601 Acute respiratory failure with hypoxia: Secondary | ICD-10-CM | POA: Diagnosis not present

## 2020-07-03 DIAGNOSIS — U071 COVID-19: Secondary | ICD-10-CM | POA: Diagnosis not present

## 2020-07-03 DIAGNOSIS — G4733 Obstructive sleep apnea (adult) (pediatric): Secondary | ICD-10-CM | POA: Diagnosis not present

## 2020-07-03 DIAGNOSIS — E662 Morbid (severe) obesity with alveolar hypoventilation: Secondary | ICD-10-CM | POA: Diagnosis not present

## 2020-07-03 LAB — CBC WITH DIFFERENTIAL/PLATELET
Abs Immature Granulocytes: 0.06 10*3/uL (ref 0.00–0.07)
Basophils Absolute: 0 10*3/uL (ref 0.0–0.1)
Basophils Relative: 0 %
Eosinophils Absolute: 0 10*3/uL (ref 0.0–0.5)
Eosinophils Relative: 0 %
HCT: 38.7 % (ref 36.0–46.0)
Hemoglobin: 11.8 g/dL — ABNORMAL LOW (ref 12.0–15.0)
Immature Granulocytes: 1 %
Lymphocytes Relative: 8 %
Lymphs Abs: 0.7 10*3/uL (ref 0.7–4.0)
MCH: 29.2 pg (ref 26.0–34.0)
MCHC: 30.5 g/dL (ref 30.0–36.0)
MCV: 95.8 fL (ref 80.0–100.0)
Monocytes Absolute: 0.9 10*3/uL (ref 0.1–1.0)
Monocytes Relative: 10 %
Neutro Abs: 7.3 10*3/uL (ref 1.7–7.7)
Neutrophils Relative %: 81 %
Platelets: 68 10*3/uL — ABNORMAL LOW (ref 150–400)
RBC: 4.04 MIL/uL (ref 3.87–5.11)
RDW: 15.4 % (ref 11.5–15.5)
WBC: 9 10*3/uL (ref 4.0–10.5)
nRBC: 0 % (ref 0.0–0.2)

## 2020-07-03 LAB — COMPREHENSIVE METABOLIC PANEL
ALT: 16 U/L (ref 0–44)
AST: 16 U/L (ref 15–41)
Albumin: 3.2 g/dL — ABNORMAL LOW (ref 3.5–5.0)
Alkaline Phosphatase: 60 U/L (ref 38–126)
Anion gap: 8 (ref 5–15)
BUN: 17 mg/dL (ref 8–23)
CO2: 49 mmol/L — ABNORMAL HIGH (ref 22–32)
Calcium: 8.1 mg/dL — ABNORMAL LOW (ref 8.9–10.3)
Chloride: 86 mmol/L — ABNORMAL LOW (ref 98–111)
Creatinine, Ser: 0.38 mg/dL — ABNORMAL LOW (ref 0.44–1.00)
GFR, Estimated: >60 mL/min (ref >60)
Glucose, Bld: 109 mg/dL — ABNORMAL HIGH (ref 70–99)
Potassium: 3.7 mmol/L (ref 3.5–5.1)
Sodium: 143 mmol/L (ref 135–145)
Total Bilirubin: 1.3 mg/dL — ABNORMAL HIGH (ref 0.3–1.2)
Total Protein: 5.5 g/dL — ABNORMAL LOW (ref 6.5–8.1)

## 2020-07-03 LAB — MAGNESIUM: Magnesium: 1.9 mg/dL (ref 1.7–2.4)

## 2020-07-03 LAB — PHOSPHORUS: Phosphorus: 2.6 mg/dL (ref 2.5–4.6)

## 2020-07-03 LAB — LACTATE DEHYDROGENASE: LDH: 147 U/L (ref 98–192)

## 2020-07-03 LAB — FERRITIN: Ferritin: 503 ng/mL — ABNORMAL HIGH (ref 11–307)

## 2020-07-03 LAB — C-REACTIVE PROTEIN: CRP: <0.5 mg/dL (ref <1.0)

## 2020-07-03 LAB — D-DIMER, QUANTITATIVE: D-Dimer, Quant: 0.3 ug/mL-FEU (ref 0.00–0.50)

## 2020-07-03 MED ORDER — MAGNESIUM SULFATE 2 GM/50ML IV SOLN
2.0000 g | Freq: Once | INTRAVENOUS | Status: AC
  Administered 2020-07-03: 2 g via INTRAVENOUS
  Filled 2020-07-03: qty 50

## 2020-07-03 MED ORDER — LISINOPRIL 2.5 MG PO TABS: 2.5000 mg | ORAL_TABLET | Freq: Every day | ORAL | Status: DC

## 2020-07-03 MED ORDER — DEXAMETHASONE 4 MG PO TABS
6.0000 mg | ORAL_TABLET | Freq: Every day | ORAL | Status: AC
  Administered 2020-07-04 – 2020-07-09 (×6): 6 mg via ORAL
  Filled 2020-07-03 (×3): qty 2
  Filled 2020-07-03: qty 3
  Filled 2020-07-03 (×2): qty 2

## 2020-07-03 MED ORDER — CARVEDILOL 6.25 MG PO TABS
6.2500 mg | ORAL_TABLET | Freq: Two times a day (BID) | ORAL | Status: DC
  Administered 2020-07-04 – 2020-07-10 (×9): 6.25 mg via ORAL
  Filled 2020-07-03 (×9): qty 1

## 2020-07-03 MED ORDER — POTASSIUM CHLORIDE CRYS ER 20 MEQ PO TBCR
50.0000 meq | EXTENDED_RELEASE_TABLET | Freq: Once | ORAL | Status: AC
  Administered 2020-07-03: 50 meq via ORAL
  Filled 2020-07-03: qty 1

## 2020-07-03 MED ORDER — LISINOPRIL 5 MG PO TABS
2.5000 mg | ORAL_TABLET | Freq: Every day | ORAL | Status: DC
  Administered 2020-07-04 – 2020-07-08 (×5): 2.5 mg via ORAL
  Filled 2020-07-03 (×5): qty 1

## 2020-07-03 NOTE — Progress Notes (Signed)
PROGRESS NOTE    [name]  GMW:[id number] DOB: 12-17-1950 DOA: 06/29/2020 PCP: [name], MD   Brief Narrative:  69 year old WF PMHx Depression, ITP, OSA/OHS and Chronic Respiratory Failure on home 3 L O2 , chronic pain, Essential HTN,  Admitted with hypoxic, hypercarbic respiratory failure, COVID-19 pneumonia.  Treated with BiPAP and admitted to ICU.    Subjective: 10/19 afebrile overnight A/O x4, patient on 3 L O2 at home   Assessment & Plan:  Covid vaccination; unvaccinated  Active Problems:   Respiratory failure with hypoxia ([place])   Respiratory failure ([place])  Acute on chronic hypoxic, hypercarbic respiratory failure -See Covid pneumonia  Baseline OSA/OHS on home O2 -Per PCCM BiPAP is mandatory at night  COVID-19 pneumonia COVID-19 Labs  Recent Labs    07/03/20 0732  DDIMER 0.30  LDH 147    Lab Results  Component Value Date   SARSCOV2NAA POSITIVE (A) 06/29/2020   [place]is M. [place]int[place] NEGATIVE 05/14/2020   [place] NEGATIVE 04/29/2020   [place] NEGATIVE 04/18/2020  -Decadron 6 mg daily -Remdesivir x5-day per pharmacy protocol -Respimat QID -Vitamin C and zinc per Covid protocol -Flutter valve -Incentive spirometry -Titrate O2 to maintain SPO2> 88% -Prone patient for 8 hours/day; if patient cannot tolerate prone 2 to 3 hours per shift  Chronic ITP Follow CBC  Hypertension -10/19 increase Coreg 6.25  BID -Lasix 80 mg daily -10/19 lisinopril 2.5 mg daily  Hypokalemia -Potassium goal> 4 -K-Phos 40 mmol + K-Dur 20 mEq -Repeat K/Mg/PO4 @1500  -10/19 on repeat K still low; K-Dur 50 mEq  Hypomagnesmia -Magnesium goal> 2 -10/19 Magnesium IV 2 g  Phosphorus -Phosphorus goal> 2.5 -See hypokalemia   DVT prophylaxis: Lovenox Code Status: Full Family Communication: 10/19 called Sharion Dove (son) went to voicemail.  Left message that I was trying to update him. Status is: Inpatient    Dispo: The patient is from: Home               Anticipated d/c is to: Home              Anticipated d/c date is: 10/22              Patient currently unstable      Consultants:  PCCM    Procedures/Significant Events:  10/15-admit 10/16-transferred to [facility] due to scarcity of beds at [facility].  Off BiPAP   I have personally reviewed and interpreted all radiology studies and my findings are as above.  VENTILATOR SETTINGS: Nasal cannula 10/19 Flow 4 L/min SPO2 96%   Cultures 10/15 SARS coronavirus positive 10/15 influenza A/B negative 10/15 urine negative 10/16 MRSA by PCR negative  Antimicrobials: Anti-infectives (From admission, onward)   Start     Ordered Stop   06/30/20 1000  remdesivir 100 mg in sodium chloride 0.9 % 100 mL IVPB       "Followed by" Linked Group Details   06/29/20 1727 07/04/20 0959   06/30/20 0200  vancomycin (VANCOREADY) IVPB 750 mg/150 mL  Status:  Discontinued        06/29/20 1543 06/30/20 1000   06/29/20 2200  ceFEPIme (MAXIPIME) 2 g in sodium chloride 0.9 % 100 mL IVPB  Status:  Discontinued        06/29/20 1543 06/30/20 1000   06/29/20 2200  vancomycin (VANCOREADY) IVPB 750 mg/150 mL  Status:  Discontinued        06/29/20 1543 06/29/20 1544   06/29/20 1730  remdesivir 200 mg in sodium chloride 0.9% 250 mL IVPB       "  Followed by" Linked Group Details   06/29/20 1727 06/29/20 2038   06/29/20 1430  ceFEPIme (MAXIPIME) 2 g in sodium chloride 0.9 % 100 mL IVPB        06/29/20 1420 06/29/20 1609   06/29/20 1430  metroNIDAZOLE (FLAGYL) IVPB 500 mg        06/29/20 1420 06/29/20 1700   06/29/20 1430  vancomycin (VANCOCIN) IVPB 1000 mg/200 mL premix  Status:  Discontinued        06/29/20 1420 06/29/20 1423   06/29/20 1430  vancomycin (VANCOREADY) IVPB 2000 mg/400 mL  Status:  Discontinued        06/29/20 1423 06/29/20 1424   06/29/20 1430  vancomycin (VANCOCIN) 2,500 mg in sodium chloride 0.9 % 500 mL IVPB        06/29/20 1424 06/29/20 2038        Devices    LINES / TUBES:       Continuous Infusions: . sodium chloride Stopped (07/02/20 1117)  . remdesivir 100 mg in NS 100 mL Stopped (07/02/20 1009)     Objective: Vitals:   07/03/20 0400 07/03/20 0500 07/03/20 0600 07/03/20 0700  BP: (!) 121/95 (!) 150/82 (!) 125/59 (!) 147/69  Pulse: 97 99 91 97  Resp: (!) 26 20 20  (!) 31  Temp:      TempSrc:      SpO2: 99% 98% 95% 96%  Weight:  93 kg    Height:        Intake/Output Summary (Last 24 hours) at 07/03/2020 0815 Last data filed at 07/03/2020 0600 Gross per 24 hour  Intake 943.33 ml  Output 3400 ml  Net -2456.67 ml   Filed Weights   06/29/20 1530 07/01/20 0459 07/03/20 0500  Weight: 100.6 kg 95.6 kg 93 kg    Examination:  General: A/O x4, positive acute on chronic respiratory distress Eyes: negative scleral hemorrhage, negative anisocoria, negative icterus ENT: Negative Runny nose, negative gingival bleeding, Neck:  Negative scars, masses, torticollis, lymphadenopathy, JVD Lungs: decreased breath sounds bilaterally, positive diffuse expiratory wheezes, negative crackles Cardiovascular: Regular rate and rhythm without murmur gallop or rub normal S1 and S2 Abdomen: MORBIDLY OBESE, negative abdominal pain, nondistended, positive soft, bowel sounds, no rebound, no ascites, no appreciable mass Extremities: No significant cyanosis, clubbing, or edema bilateral lower extremities Skin: Negative rashes, lesions, ulcers Psychiatric:  Negative depression, negative anxiety, negative fatigue, negative mania, not entirely sure patient cognition is at baseline told me she does not have OSA/OHS however PCCM emphatic that patient CANNOT be without BiPAP at night. Central nervous system:  Cranial nerves II through XII intact, tongue/uvula midline, all extremities muscle strength 5/5, sensation intact throughout, negative dysarthria, negative expressive aphasia, negative receptive aphasia.  .     Data Reviewed: Care during the described time interval was  provided by me .  I have reviewed this patient's available data, including medical history, events of note, physical examination, and all test results as part of my evaluation.   CBC: Recent Labs  Lab 06/29/20 1540 06/29/20 1817 06/29/20 2249 06/29/20 2249 06/30/20 0123 06/30/20 0952 07/01/20 0823 07/02/20 0653 07/03/20 0732  WBC 6.3  --  6.2  --   --   --  3.9* 3.0* 9.0  NEUTROABS 4.6  --   --   --   --   --   --   --  7.3  HGB 11.3*   < > 10.6*   < > 10.5* 10.5* 11.2* 10.6* 11.8*  HCT 38.4   < >  36.4   < > 31.0* 31.0* 36.7 35.0* 38.7  MCV 96.5  --  97.1  --   --   --  97.9 96.2 95.8  PLT 58*  --  55*  --   --   --  59* 51* 68*   < > = values in this interval not displayed.   Basic Metabolic Panel: Recent Labs  Lab 06/29/20 1427 06/29/20 1817 06/29/20 2231 06/29/20 2249 06/30/20 0123 06/30/20 0123 06/30/20 5009 07/01/20 0823 07/02/20 0653 07/02/20 1642 07/03/20 0732  NA 137   < >   < >  --  140  --  142 141 142  --  143  K 4.2   < >   < >  --  3.3*   < > 3.5 3.9 2.9* 3.6 3.7  CL 96*  --   --   --   --   --   --  96* 91*  --  86*  CO2 30  --   --   --   --   --   --  34* 42*  --  49*  GLUCOSE 82  --   --   --   --   --   --  109* 138*  --  109*  BUN 15  --   --   --   --   --   --  23 24*  --  17  CREATININE 0.66  --   --  0.67  --   --   --  0.52 0.50  --  0.38*  CALCIUM 8.4*  --   --   --   --   --   --  8.2* 8.1*  --  8.1*  MG  --   --   --   --   --   --   --   --  2.0 1.7 1.9  PHOS  --   --   --   --   --   --   --   --  1.4* 3.6 2.6   < > = values in this interval not displayed.   GFR: Estimated Creatinine Clearance: 69.5 mL/min (A) (by C-G formula based on SCr of 0.38 mg/dL (L)). Liver Function Tests: Recent Labs  Lab 06/29/20 1427 07/01/20 0823 07/02/20 0653 07/03/20 0732  AST 31 22 14* 16  ALT 24 17 16 16   ALKPHOS 83 59 58 60  BILITOT 1.2 0.9 1.0 1.3*  PROT 5.7* 5.3* 5.3* 5.5*  ALBUMIN 3.3* 3.0* 3.1* 3.2*   No results for input(s): LIPASE,  AMYLASE in the last 168 hours. No results for input(s): AMMONIA in the last 168 hours. Coagulation Profile: Recent Labs  Lab 06/29/20 1427  INR 1.0   Cardiac Enzymes: No results for input(s): CKTOTAL, CKMB, CKMBINDEX, TROPONINI in the last 168 hours. BNP (last 3 results) No results for input(s): PROBNP in the last 8760 hours. HbA1C: No results for input(s): HGBA1C in the last 72 hours. CBG: No results for input(s): GLUCAP in the last 168 hours. Lipid Profile: No results for input(s): CHOL, HDL, LDLCALC, TRIG, CHOLHDL, LDLDIRECT in the last 72 hours. Thyroid Function Tests: No results for input(s): TSH, T4TOTAL, FREET4, T3FREE, THYROIDAB in the last 72 hours. Anemia Panel: No results for input(s): VITAMINB12, FOLATE, FERRITIN, TIBC, IRON, RETICCTPCT in the last 72 hours. Urine analysis:    Component Value Date/Time   COLORURINE YELLOW 06/29/2020 [place] 06/29/2020 1427   LABSPEC  1.013 06/29/2020 1427   PHURINE 5.0 06/29/2020 1427   GLUCOSEU NEGATIVE 06/29/2020 1427   HGBUR NEGATIVE 06/29/2020 1427   BILIRUBINUR NEGATIVE 06/29/2020 1427   KETONESUR 5 (A) 06/29/2020 1427   PROTEINUR 30 (A) 06/29/2020 1427   UROBILINOGEN 0.2 11/13/2014 0233   NITRITE NEGATIVE 06/29/2020 1427   LEUKOCYTESUR NEGATIVE 06/29/2020 1427   Sepsis Labs: @LABRCNTIP (procalcitonin:4,lacticidven:4)  ) Recent Results (from the past 240 hour(s))  Respiratory Panel by RT PCR (Flu A&B, Covid) - Nasopharyngeal Swab     Status: Abnormal   Collection Time: 06/29/20  2:27 PM   Specimen: Nasopharyngeal Swab  Result Value Ref Range Status   SARS Coronavirus 2 by RT PCR POSITIVE (A) NEGATIVE Final    Comment: emailed L. Berdik RN 15:45 06/29/20 ([email or web address]) (NOTE) SARS-CoV-2 target nucleic acids are DETECTED.  SARS-CoV-2 RNA is generally detectable in upper respiratory specimens  during the acute phase of infection. Positive results are indicative of the presence of the identified virus, but  do not rule out bacterial infection or co-infection with other pathogens not detected by the test. Clinical correlation with patient history and other diagnostic information is necessary to determine patient infection status. The expected result is Negative.  Fact Sheet for Patients:  PinkCheek.be  Fact Sheet for Healthcare Providers: GravelBags.it  This test is not yet approved or cleared by the [place] FDA and  has been authorized for detection and/or diagnosis of SARS-CoV-2 by FDA under an Emergency Use Authorization (EUA).  This EUA will remain in effect (meaning this test can be used) for the duration of  the COVID-19  declaration under Section 564(b)(1) of the Act, 21 U.S.C. section 360bbb-3(b)(1), unless the authorization is terminated or revoked sooner.      Influenza A by PCR NEGATIVE NEGATIVE Final   Influenza B by PCR NEGATIVE NEGATIVE Final    Comment: (NOTE) The Xpert Xpress SARS-CoV-2/FLU/RSV assay is intended as an aid in  the diagnosis of influenza from Nasopharyngeal swab specimens and  should not be used as a sole basis for treatment. Nasal washings and  aspirates are unacceptable for Xpert Xpress SARS-CoV-2/FLU/RSV  testing.  Fact Sheet for Patients: PinkCheek.be  Fact Sheet for Healthcare Providers: GravelBags.it  This test is not yet approved or cleared by the [place] FDA and  has been authorized for detection and/or diagnosis of SARS-CoV-2 by  FDA under an Emergency Use Authorization (EUA). This EUA will remain  in effect (meaning this test can be used) for the duration of the  Covid-19 declaration under Section 564(b)(1) of the Act, 21  U.S.C. section 360bbb-3(b)(1), unless the authorization is  terminated or revoked. Performed at [facility], [place] [address] [place], [place] [address]   Urine culture     Status:  None   Collection Time: 06/29/20  2:28 PM   Specimen: In/Out Cath Urine  Result Value Ref Range Status   Specimen Description IN/OUT CATH URINE  Final   Special Requests NONE  Final   Culture   Final    NO GROWTH Performed at [facility], [place] [address], [place], [place] [address]    Report Status 06/30/2020 FINAL  Final  MRSA PCR Screening     Status: None   Collection Time: 06/30/20 12:50 PM   Specimen: Nasal Mucosa; Nasopharyngeal  Result Value Ref Range Status   MRSA by PCR NEGATIVE NEGATIVE Final    Comment:        The GeneXpert MRSA Assay (FDA approved for  NASAL specimens only), is one component of a comprehensive MRSA colonization surveillance program. It is not intended to diagnose MRSA infection nor to guide or monitor treatment for MRSA infections. Performed at [facility], [place] [address] [place], [place] [address]          Radiology Studies: No results found.      Scheduled Meds: . vitamin C  500 mg Oral Daily  . carvedilol  3.125 mg Oral BID WC  . chlorhexidine  15 mL Mouth Rinse BID  . Chlorhexidine Gluconate Cloth  6 each Topical Daily  . dexamethasone (DECADRON) injection  6 mg Intravenous Q24H  . enoxaparin (LOVENOX) injection  40 mg Subcutaneous Q24H  . furosemide  80 mg Intravenous Daily  . influenza vaccine adjuvanted  0.5 mL Intramuscular Tomorrow-1000  . Ipratropium-Albuterol  2 puff Inhalation Q4H  . mouth rinse  15 mL Mouth Rinse q12n4p  . nystatin   Topical BID  . pneumococcal 23 valent vaccine  0.5 mL Intramuscular Tomorrow-1000  . zinc sulfate  220 mg Oral Daily   Continuous Infusions: . sodium chloride Stopped (07/02/20 1117)  . remdesivir 100 mg in NS 100 mL Stopped (07/02/20 1009)     LOS: 4 days   The patient is critically ill with multiple organ systems failure and requires high complexity decision making for assessment and support, frequent evaluation and titration of therapies, application of  advanced monitoring technologies and extensive interpretation of multiple databases. Critical Care Time devoted to patient care services described in this note  Time spent: 40 minutes     Kynli Chou, Geraldo Docker, MD [facility] Pager [phone]  If 7PM-7AM, please contact night-coverage [email or web address] Password [email or web address] 07/03/2020, 8:15 AM

## 2020-07-03 NOTE — TOC Progression Note (Addendum)
Transition of Care [facility]) - Progression Note    Patient Details  Name: [name] MRN: [id number] Date of Birth: [birth year]  [facility] CM/SW Contact  Leeroy Cha, RN Phone Number: 07/03/2020, 11:34 AM  Clinical Narrative:    Question as to ability to care for self at home,rn on the floor reports that pt was filthy when she came and has sores to the lower legs, was [facility], attempt to contact [facility] from [facility] started. See noted for conversation dated [id number]  Expected Discharge Plan: [place] Barriers to Discharge: Barriers Unresolved (comment)  Expected Discharge Plan and Services Expected Discharge Plan: [place]   Discharge Planning Services: CM Consult   Living arrangements for the past 2 months: Single Family Home                                       Social Determinants of Health (SDOH) Interventions    Readmission Risk Interventions No flowsheet data found.

## 2020-07-04 ENCOUNTER — Inpatient Hospital Stay (HOSPITAL_COMMUNITY): Payer: Medicare PPO

## 2020-07-04 DIAGNOSIS — J9621 Acute and chronic respiratory failure with hypoxia: Secondary | ICD-10-CM | POA: Diagnosis not present

## 2020-07-04 DIAGNOSIS — U071 COVID-19: Secondary | ICD-10-CM

## 2020-07-04 DIAGNOSIS — D693 Immune thrombocytopenic purpura: Secondary | ICD-10-CM | POA: Diagnosis not present

## 2020-07-04 DIAGNOSIS — J1282 Pneumonia due to coronavirus disease 2019: Secondary | ICD-10-CM

## 2020-07-04 DIAGNOSIS — J9622 Acute and chronic respiratory failure with hypercapnia: Secondary | ICD-10-CM

## 2020-07-04 DIAGNOSIS — K2100 Gastro-esophageal reflux disease with esophagitis, without bleeding: Secondary | ICD-10-CM

## 2020-07-04 DIAGNOSIS — I10 Essential (primary) hypertension: Secondary | ICD-10-CM

## 2020-07-04 LAB — CBC WITH DIFFERENTIAL/PLATELET
Abs Immature Granulocytes: 0.07 10*3/uL (ref 0.00–0.07)
Basophils Absolute: 0 10*3/uL (ref 0.0–0.1)
Basophils Relative: 0 %
Eosinophils Absolute: 0 10*3/uL (ref 0.0–0.5)
Eosinophils Relative: 0 %
HCT: 38.7 % (ref 36.0–46.0)
Hemoglobin: 11.7 g/dL — ABNORMAL LOW (ref 12.0–15.0)
Immature Granulocytes: 1 %
Lymphocytes Relative: 10 %
Lymphs Abs: 0.7 10*3/uL (ref 0.7–4.0)
MCH: 29 pg (ref 26.0–34.0)
MCHC: 30.2 g/dL (ref 30.0–36.0)
MCV: 95.8 fL (ref 80.0–100.0)
Monocytes Absolute: 0.7 10*3/uL (ref 0.1–1.0)
Monocytes Relative: 10 %
Neutro Abs: 5.6 10*3/uL (ref 1.7–7.7)
Neutrophils Relative %: 79 %
Platelets: 57 10*3/uL — ABNORMAL LOW (ref 150–400)
RBC: 4.04 MIL/uL (ref 3.87–5.11)
RDW: 15.4 % (ref 11.5–15.5)
WBC: 7 10*3/uL (ref 4.0–10.5)
nRBC: 0 % (ref 0.0–0.2)

## 2020-07-04 LAB — COMPREHENSIVE METABOLIC PANEL
ALT: 16 U/L (ref 0–44)
AST: 14 U/L — ABNORMAL LOW (ref 15–41)
Albumin: 3.3 g/dL — ABNORMAL LOW (ref 3.5–5.0)
Alkaline Phosphatase: 56 U/L (ref 38–126)
Anion gap: 11 (ref 5–15)
BUN: 13 mg/dL (ref 8–23)
CO2: 50 mmol/L — ABNORMAL HIGH (ref 22–32)
Calcium: 7.9 mg/dL — ABNORMAL LOW (ref 8.9–10.3)
Chloride: 81 mmol/L — ABNORMAL LOW (ref 98–111)
Creatinine, Ser: 0.33 mg/dL — ABNORMAL LOW (ref 0.44–1.00)
GFR, Estimated: >60 mL/min (ref >60)
Glucose, Bld: 130 mg/dL — ABNORMAL HIGH (ref 70–99)
Potassium: 3.7 mmol/L (ref 3.5–5.1)
Sodium: 142 mmol/L (ref 135–145)
Total Bilirubin: 1.9 mg/dL — ABNORMAL HIGH (ref 0.3–1.2)
Total Protein: 5.3 g/dL — ABNORMAL LOW (ref 6.5–8.1)

## 2020-07-04 LAB — PHOSPHORUS: Phosphorus: 2 mg/dL — ABNORMAL LOW (ref 2.5–4.6)

## 2020-07-04 LAB — C-REACTIVE PROTEIN: CRP: 0.6 mg/dL (ref <1.0)

## 2020-07-04 LAB — FERRITIN: Ferritin: 99 ng/mL (ref 11–307)

## 2020-07-04 LAB — MAGNESIUM: Magnesium: 2.5 mg/dL — ABNORMAL HIGH (ref 1.7–2.4)

## 2020-07-04 LAB — D-DIMER, QUANTITATIVE: D-Dimer, Quant: <0.27 ug/mL-FEU (ref 0.00–0.50)

## 2020-07-04 LAB — LACTATE DEHYDROGENASE: LDH: 145 U/L (ref 98–192)

## 2020-07-04 MED ORDER — POTASSIUM CHLORIDE CRYS ER 20 MEQ PO TBCR
40.0000 meq | EXTENDED_RELEASE_TABLET | Freq: Once | ORAL | Status: AC
  Administered 2020-07-04: 40 meq via ORAL
  Filled 2020-07-04: qty 2

## 2020-07-04 MED ORDER — GUAIFENESIN-DM 100-10 MG/5ML PO SYRP: 5.0000 mL | ORAL_SOLUTION | ORAL | Status: DC | PRN

## 2020-07-04 MED ORDER — ALBUTEROL SULFATE HFA 108 (90 BASE) MCG/ACT IN AERS: 1.0000 | INHALATION_SPRAY | RESPIRATORY_TRACT | Status: DC | PRN

## 2020-07-04 MED ORDER — IPRATROPIUM-ALBUTEROL 20-100 MCG/ACT IN AERS
2.0000 | INHALATION_SPRAY | Freq: Four times a day (QID) | RESPIRATORY_TRACT | Status: DC
  Administered 2020-07-04 – 2020-07-10 (×23): 2 via RESPIRATORY_TRACT
  Filled 2020-07-04: qty 4

## 2020-07-04 NOTE — Progress Notes (Addendum)
Patient refuses BiPAP tonight. RN aware. Equipment remains at the bedside should she change her mind.

## 2020-07-04 NOTE — Evaluation (Signed)
Physical Therapy Evaluation Patient Details Name: [name] MRN: [id number] DOB: 01-18-1951 Today's Date: 07/04/2020   History of Present Illness  Mrs. Goodson was admitted with a working diagnosis of acute hypoxic/hypercapnic respiratory failure due to SARS COVID-19 viral pneumonia, to [facility] 10/15 with respiratory distress and metabolic encephalopaty.  Clinical Impression  The patient requires extensive assistance for bed mobility. Did not attempt standing for safty reasons. Patient unable to provide prior level of function. Pt admitted with above diagnosis.   Pt currently with functional limitations due to the deficits listed below (see PT Problem List). Pt will benefit from skilled PT to increase their independence and safety with mobility to allow discharge to the venue listed below.   Patient is on 10 L HFNC, SPO2 >92 %. Noted expiratory wheezes with mobility.    Follow Up Recommendations SNF    Equipment Recommendations  None recommended by PT    Recommendations for Other Services       Precautions / Restrictions Precautions Precautions: Fall Precaution Comments: pt is %', very weak, monitor sats      Mobility  Bed Mobility Overal bed mobility: Needs Assistance Bed Mobility: Rolling;Supine to Sit;Sit to Supine Rolling: Max assist   Supine to sit: Max assist Sit to supine: Max assist   General bed mobility comments: patient oofers little assistance, body habitus hinders.    Transfers                 General transfer comment: NT, pt very short in stature  Ambulation/Gait                Stairs            Wheelchair Mobility    Modified Rankin (Stroke Patients Only)       Balance Overall balance assessment: Needs assistance Sitting-balance support: Bilateral upper extremity supported;Feet supported Sitting balance-Leahy Scale: Poor                                       Pertinent Vitals/Pain Pain Assessment:  Faces Faces Pain Scale: Hurts whole lot Pain Location: back, knees Pain Descriptors / Indicators: Aching;Jabbing;Grimacing;Guarding Pain Intervention(s): Premedicated before session;Monitored during session    Home Living Family/patient expects to be discharged to:: Private residence Living Arrangements: Children Available Help at Discharge: Available PRN/intermittently;Family Type of Home: House Home Access: Stairs to enter   Technical brewer of Steps: 3 Home Layout: One level   Additional Comments: info from previous encounter, appears has been to SNF in recent past.    Prior Function           Comments: unsure, patient sdoes not provide     Hand Dominance        Extremity/Trunk Assessment   Upper Extremity Assessment Upper Extremity Assessment: Defer to OT evaluation    Lower Extremity Assessment Lower Extremity Assessment: Generalized weakness (ace wraps to both lower legs)    Cervical / Trunk Assessment Cervical / Trunk Assessment: Kyphotic  Communication      Cognition Arousal/Alertness: Awake/alert Behavior During Therapy: WFL for tasks assessed/performed Overall Cognitive Status: Impaired/Different from baseline Area of Impairment: Orientation;Attention;Memory;Following commands;Safety/judgement                 Orientation Level: Place;Time;Situation Current Attention Level: Sustained Memory: Decreased short-term memory Following Commands: Follows one step commands inconsistently Safety/Judgement: Decreased awareness of deficits;Decreased awareness of safety  General Comments      Exercises     Assessment/Plan    PT Assessment Patient needs continued PT services  PT Problem List Decreased strength;Decreased activity tolerance;Decreased mobility;Decreased knowledge of use of DME;Decreased safety awareness;Decreased knowledge of precautions;Cardiopulmonary status limiting activity       PT Treatment Interventions  DME instruction;Functional mobility training;Therapeutic activities;Therapeutic exercise;Patient/family education    PT Goals (Current goals can be found in the Care Plan section)  Acute Rehab PT Goals Patient Stated Goal: agreed to sit up PT Goal Formulation: Patient unable to participate in goal setting Time For Goal Achievement: 07/18/20    Frequency Min 2X/week   Barriers to discharge        Co-evaluation               AM-PAC PT "6 Clicks" Mobility  Outcome Measure Help needed turning from your back to your side while in a flat bed without using bedrails?: Total Help needed moving from lying on your back to sitting on the side of a flat bed without using bedrails?: Total Help needed moving to and from a bed to a chair (including a wheelchair)?: Total Help needed standing up from a chair using your arms (e.g., wheelchair or bedside chair)?: Total Help needed to walk in hospital room?: Total Help needed climbing 3-5 steps with a railing? : Total 6 Click Score: 6    End of Session Equipment Utilized During Treatment: Oxygen Activity Tolerance: Patient tolerated treatment well Patient left: in bed;with call bell/phone within reach;with bed alarm set Nurse Communication: Mobility status;Need for lift equipment PT Visit Diagnosis: Difficulty in walking, not elsewhere classified (R26.2);Muscle weakness (generalized) (M62.81)    Time: 1157-2620 PT Time Calculation (min) (ACUTE ONLY): 41 min   Charges:   PT Evaluation $PT Eval Moderate Complexity: 1 Mod PT Treatments $Therapeutic Activity: 23-37 mins        Tresa Endo PT Acute Rehabilitation Services Pager [phone] Office [phone]   Claretha Cooper 07/04/2020, 6:24 PM

## 2020-07-04 NOTE — Progress Notes (Signed)
PROGRESS NOTE    [name]  TXM:[id number] DOB: 04/05/1951 DOA: 06/29/2020 PCP: [name], MD    Brief Narrative:  [name] was admitted with a working diagnosis of acute hypoxic/hypercapnic respiratory failure due to SARS COVID-19 viral pneumonia.  69 year old female was brought to the hospital due to oxygen saturation down to the 80s, she was unable to give any detailed history due to metabolic encephalopathy.  On her initial physical examination blood pressure 124/52, heart rate 116, temperature 99.4, respiratory rate 22, oxygen saturation 100% supplemental oxygen per noninvasive mechanical ventilation.  Heart S1-S2, present rhythmic, his lungs had bilateral wheezing, abdomen protuberant, soft nontender, positive bilateral lower extremity edema.   Chest radiograph with right lower lobe opacity with small right pleural effusion, left lower lobe interstitial infiltrate.   Patient has been medically treated with remdesivir, and systemic corticosteroids.  Continue using BiPAP at night.   Assessment & Plan:   Principal Problem:   Pneumonia due to COVID-19 virus Active Problems:   HTN (hypertension)   Chronic idiopathic thrombocytopenia (HCC)   GERD (gastroesophageal reflux disease)   Obesity hypoventilation syndrome (HCC)   Acute on chronic respiratory failure with hypoxia and hypercapnia (HCC)   1.  Acute on chronic hypoxic/hypercapnic respiratory failure due to SARS COVID-19 viral pneumonia.  RR: 27  Pulse oxymetry: 87 to 92%  Fi02: 2 L/min per [place] now up to 15 L/min HFNC   COVID-19 Labs  Recent Labs    07/03/20 0732 07/04/20 0248  DDIMER 0.30 <0.27  FERRITIN 503* 99  LDH 147 145  CRP <0.5 0.6    Lab Results  Component Value Date   SARSCOV2NAA POSITIVE (A) 06/29/2020   SARSCOV2NAA NEGATIVE 05/14/2020   [place] NEGATIVE 04/29/2020   [place] NEGATIVE 04/18/2020    This am patient had increase oxygen requirements to 15 from 2, she has  been very weak and deconditioned, not doing airway clearing exercises (incentive spirometer).  Inflammatory markers are improving.   Patient has completed antiviral therapy with remdesivir, continue with systemic steroids with dexamethasone 6 mg daily.  Bronchodilator and antitussive therapy, out of bed to chair and physical/occupational therapy.  Suspect worsening oxygenation due to atelectasis related to poor mobility.   2. HTN. Blood pressure 127 to 482 mmHg systolic. Continue blood pressure control with lisinopril and carvedilol.  Continue with furosemide 80 mg IV   3. Hypokalemia/ hypophosphatemia and hypomagnesemia. K 3,7 and Mg at 1,9, renal function with stable cr at 0,38. P 2,6.  Continue K correction with 58 Kcl and follow up on renal panel in am.   4. Obesity class 3/ OSA. Calculated BMI is 40.0. Continue cpap at night per home settings.   5. Chronic lymphedema lower extremities and ambulatory dysfunction. Continue with compression bandages. Out of bed to chair tid with meals, pt and ot evaluation.  Patient lived with her daughter and uses a walker and cane at home.   6. Stage 2 pressure ulcer at the nose. Present on admission.   Patient continue to be at high risk for worsening respiratory failure   Status is: Inpatient  Remains inpatient appropriate because:Inpatient level of care appropriate due to severity of illness   Dispo: The patient is from: Home              Anticipated d/c is to: Home              Anticipated d/c date is: 3 days  Patient currently is not medically stable to d/c.    DVT prophylaxis: Enoxaparin   Code Status:   full  Family Communication:  I spoke over the phone with the patient's daughter about patient's  condition, plan of care, prognosis and all questions were addressed.    Nutrition Status:           Skin Documentation: Pressure Injury 04/22/20 Sacrum Medial Stage 2 -  Partial thickness loss of dermis presenting as a  shallow open injury with a red, pink wound bed without slough. blanching redness with small open area (Active)  04/22/20 1553  Location: Sacrum  Location Orientation: Medial  Staging: Stage 2 -  Partial thickness loss of dermis presenting as a shallow open injury with a red, pink wound bed without slough.  Wound Description (Comments): blanching redness with small open area  Present on Admission:      Pressure Injury 04/22/20 Thigh Posterior;Proximal;Right Stage 2 -  Partial thickness loss of dermis presenting as a shallow open injury with a red, pink wound bed without slough. small open area (Active)  04/22/20 1555  Location: Thigh  Location Orientation: Posterior;Proximal;Right  Staging: Stage 2 -  Partial thickness loss of dermis presenting as a shallow open injury with a red, pink wound bed without slough.  Wound Description (Comments): small open area  Present on Admission:      Pressure Injury 05/14/20 Buttocks Left;Medial Stage 2 -  Partial thickness loss of dermis presenting as a shallow open injury with a red, pink wound bed without slough. (Active)  05/14/20 0510  Location: Buttocks  Location Orientation: Left;Medial  Staging: Stage 2 -  Partial thickness loss of dermis presenting as a shallow open injury with a red, pink wound bed without slough.  Wound Description (Comments):   Present on Admission: Yes     Pressure Injury 06/30/20 Nose Medial Stage 2 -  Partial thickness loss of dermis presenting as a shallow open injury with a red, pink wound bed without slough. (Active)  06/30/20 1230  Location: Nose  Location Orientation: Medial  Staging: Stage 2 -  Partial thickness loss of dermis presenting as a shallow open injury with a red, pink wound bed without slough.  Wound Description (Comments):   Present on Admission: Yes          Subjective: Patient is very weak and deconditioned, continue to have persistent dyspnea, worse with movement, no chest pain, no wheezing  but positive cough.  Objective: Vitals:   07/04/20 0300 07/04/20 0400 07/04/20 0700 07/04/20 0800  BP: (!) 128/53 136/74 (!) 96/39 128/72  Pulse: 87 79 76 87  Resp: (!) 30 17 19  (!) 27  Temp:      TempSrc:      SpO2: 100% 100% (!) 87% 92%  Weight:      Height:        Intake/Output Summary (Last 24 hours) at 07/04/2020 0848 Last data filed at 07/04/2020 0400 Gross per 24 hour  Intake 449.59 ml  Output 3900 ml  Net -3450.41 ml   Filed Weights   06/29/20 1530 07/01/20 0459 07/03/20 0500  Weight: 100.6 kg 95.6 kg 93 kg    Examination:   General: Not in pain,  deconditioned and ill looking appearing  Neurology: Awake and alert, non focal  E ENT: no pallor, no icterus, oral mucosa moist Cardiovascular: No JVD. S1-S2 present, rhythmic, no gallops, rubs, or murmurs. ++ non pitting lower extremity edema. Compression bandages in place.  Pulmonary: positive breath sounds  bilaterally, with, no wheezing, rhonchi or rales. Gastrointestinal. Abdomen soft and non tender Skin. No rashes Musculoskeletal: no joint deformities     Data Reviewed: I have personally reviewed following labs and imaging studies  CBC: Recent Labs  Lab 06/29/20 1540 06/29/20 1817 06/29/20 2249 06/30/20 0123 06/30/20 1610 07/01/20 0823 07/02/20 0653 07/03/20 0732 07/04/20 0248  WBC 6.3   < > 6.2  --   --  3.9* 3.0* 9.0 7.0  NEUTROABS 4.6  --   --   --   --   --   --  7.3 5.6  HGB 11.3*   < > 10.6*   < > 10.5* 11.2* 10.6* 11.8* 11.7*  HCT 38.4   < > 36.4   < > 31.0* 36.7 35.0* 38.7 38.7  MCV 96.5   < > 97.1  --   --  97.9 96.2 95.8 95.8  PLT 58*   < > 55*  --   --  59* 51* 68* 57*   < > = values in this interval not displayed.   Basic Metabolic Panel: Recent Labs  Lab 06/29/20 1427 06/29/20 1817 06/29/20 2249 06/30/20 0123 06/30/20 9604 06/30/20 5409 07/01/20 0823 07/02/20 0653 07/02/20 1642 07/03/20 0732 07/04/20 0248  NA 137   < >  --    < > 142  --  141 142  --  143 142  K 4.2   < >   --    < > 3.5   < > 3.9 2.9* 3.6 3.7 3.7  CL 96*  --   --   --   --   --  96* 91*  --  86* 81*  CO2 30  --   --   --   --   --  34* 42*  --  49* 50*  GLUCOSE 82  --   --   --   --   --  109* 138*  --  109* 130*  BUN 15  --   --   --   --   --  23 24*  --  17 13  CREATININE 0.66   < > 0.67  --   --   --  0.52 0.50  --  0.38* 0.33*  CALCIUM 8.4*  --   --   --   --   --  8.2* 8.1*  --  8.1* 7.9*  MG  --   --   --   --   --   --   --  2.0 1.7 1.9 2.5*  PHOS  --   --   --   --   --   --   --  1.4* 3.6 2.6 2.0*   < > = values in this interval not displayed.   GFR: Estimated Creatinine Clearance: 69.5 mL/min (A) (by C-G formula based on SCr of 0.33 mg/dL (L)). Liver Function Tests: Recent Labs  Lab 06/29/20 1427 07/01/20 0823 07/02/20 0653 07/03/20 0732 07/04/20 0248  AST 31 22 14* 16 14*  ALT 24 17 16 16 16   ALKPHOS 83 59 58 60 56  BILITOT 1.2 0.9 1.0 1.3* 1.9*  PROT 5.7* 5.3* 5.3* 5.5* 5.3*  ALBUMIN 3.3* 3.0* 3.1* 3.2* 3.3*   No results for input(s): LIPASE, AMYLASE in the last 168 hours. No results for input(s): AMMONIA in the last 168 hours. Coagulation Profile: Recent Labs  Lab 06/29/20 1427  INR 1.0   Cardiac Enzymes: No results for input(s): CKTOTAL, CKMB, CKMBINDEX, TROPONINI  in the last 168 hours. BNP (last 3 results) No results for input(s): PROBNP in the last 8760 hours. HbA1C: No results for input(s): HGBA1C in the last 72 hours. CBG: No results for input(s): GLUCAP in the last 168 hours. Lipid Profile: No results for input(s): CHOL, HDL, LDLCALC, TRIG, CHOLHDL, LDLDIRECT in the last 72 hours. Thyroid Function Tests: No results for input(s): TSH, T4TOTAL, FREET4, T3FREE, THYROIDAB in the last 72 hours. Anemia Panel: Recent Labs    07/03/20 0732 07/04/20 0248  FERRITIN 503* 99      Radiology Studies: I have reviewed all of the imaging during this hospital visit personally     Scheduled Meds: . vitamin C  500 mg Oral Daily  . carvedilol  6.25 mg  Oral BID WC  . chlorhexidine  15 mL Mouth Rinse BID  . Chlorhexidine Gluconate Cloth  6 each Topical Daily  . dexamethasone  6 mg Oral Q breakfast  . enoxaparin (LOVENOX) injection  40 mg Subcutaneous Q24H  . furosemide  80 mg Intravenous Daily  . influenza vaccine adjuvanted  0.5 mL Intramuscular Tomorrow-1000  . Ipratropium-Albuterol  2 puff Inhalation Q4H  . lisinopril  2.5 mg Oral Daily  . mouth rinse  15 mL Mouth Rinse q12n4p  . nystatin   Topical BID  . pneumococcal 23 valent vaccine  0.5 mL Intramuscular Tomorrow-1000  . zinc sulfate  220 mg Oral Daily   Continuous Infusions: . sodium chloride Stopped (07/02/20 1117)     LOS: 5 days        Michayla Mcneil Gerome Apley, MD

## 2020-07-05 DIAGNOSIS — K2100 Gastro-esophageal reflux disease with esophagitis, without bleeding: Secondary | ICD-10-CM | POA: Diagnosis not present

## 2020-07-05 DIAGNOSIS — J9621 Acute and chronic respiratory failure with hypoxia: Secondary | ICD-10-CM | POA: Diagnosis not present

## 2020-07-05 DIAGNOSIS — U071 COVID-19: Secondary | ICD-10-CM | POA: Diagnosis not present

## 2020-07-05 DIAGNOSIS — D693 Immune thrombocytopenic purpura: Secondary | ICD-10-CM | POA: Diagnosis not present

## 2020-07-05 LAB — COMPREHENSIVE METABOLIC PANEL
ALT: 17 U/L (ref 0–44)
AST: 16 U/L (ref 15–41)
Albumin: 3.5 g/dL (ref 3.5–5.0)
Alkaline Phosphatase: 55 U/L (ref 38–126)
Anion gap: 13 (ref 5–15)
BUN: 16 mg/dL (ref 8–23)
CO2: 47 mmol/L — ABNORMAL HIGH (ref 22–32)
Calcium: 8.7 mg/dL — ABNORMAL LOW (ref 8.9–10.3)
Chloride: 82 mmol/L — ABNORMAL LOW (ref 98–111)
Creatinine, Ser: 0.4 mg/dL — ABNORMAL LOW (ref 0.44–1.00)
GFR, Estimated: >60 mL/min (ref >60)
Glucose, Bld: 120 mg/dL — ABNORMAL HIGH (ref 70–99)
Potassium: 3.7 mmol/L (ref 3.5–5.1)
Sodium: 142 mmol/L (ref 135–145)
Total Bilirubin: 2.3 mg/dL — ABNORMAL HIGH (ref 0.3–1.2)
Total Protein: 5.8 g/dL — ABNORMAL LOW (ref 6.5–8.1)

## 2020-07-05 LAB — C-REACTIVE PROTEIN: CRP: <0.5 mg/dL (ref <1.0)

## 2020-07-05 LAB — FERRITIN: Ferritin: 141 ng/mL (ref 11–307)

## 2020-07-05 LAB — D-DIMER, QUANTITATIVE: D-Dimer, Quant: <0.27 ug/mL-FEU (ref 0.00–0.50)

## 2020-07-05 MED ORDER — FUROSEMIDE 40 MG PO TABS
80.0000 mg | ORAL_TABLET | Freq: Every day | ORAL | Status: DC
  Administered 2020-07-06 – 2020-07-07 (×2): 80 mg via ORAL
  Filled 2020-07-05 (×2): qty 2

## 2020-07-05 MED ORDER — POTASSIUM CHLORIDE CRYS ER 20 MEQ PO TBCR
40.0000 meq | EXTENDED_RELEASE_TABLET | Freq: Once | ORAL | Status: AC
  Administered 2020-07-05: 40 meq via ORAL
  Filled 2020-07-05: qty 2

## 2020-07-05 NOTE — NC FL2 (Signed)
[place] MEDICAID FL2 LEVEL OF CARE SCREENING TOOL     IDENTIFICATION  Patient Name: [name] Birthdate: 11-Apr-1951 Sex: female Admission Date (Current Location): 06/29/2020  [facility] and [place] Number:  Herbalist and Address:  [facility],  [place], [place]      Provider Number: [id number]  Attending Physician Name and Address:  Tawni Millers  Relative Name and Phone Number:       Current Level of Care: Hospital Recommended Level of Care: [place] Prior Approval Number:    Date Approved/Denied:   PASRR Number: [id number]  Discharge Plan: SNF    Current Diagnoses: Patient Active Problem List   Diagnosis Date Noted  . Pneumonia due to COVID-19 virus 07/04/2020  . Respiratory failure ([place]) 06/30/2020  . Respiratory failure with hypoxia ([place]) 06/29/2020  . Shock ([place]) 05/14/2020  . Chronic, continuous use of opioids 05/14/2020  . Opioid overdose ([place]) 05/14/2020  . Acute on chronic respiratory failure with hypoxia and hypercapnia ([place]) 05/14/2020  . Pressure injury of skin 04/22/2020  . Sepsis due to cellulitis ([place]) 04/18/2020  . Venous stasis dermatitis of both lower extremities 02/25/2019  . Encounter for preventive care 10/20/2018  . Chronic respiratory failure with hypoxia and hypercapnia ([place]) 10/06/2018  . Polycythemia, secondary 10/05/2018  . Obesity hypoventilation syndrome ([place]) 10/04/2018  . AKI (acute kidney injury) ([place])   . BMI 40.0-44.9, adult ([place])   . Bilateral lower extremity edema   . Venous stasis dermatitis 09/30/2018  . Wound of lower extremity, bilateral 09/30/2018  . Chronic heart failure with preserved ejection fraction with moderate RV failure 06/09/2017  . GERD (gastroesophageal reflux disease) 11/13/2014  . Opiate-induced constipation 11/13/2014  . History of tobacco abuse 03/10/2014  . Chronic back pain secondary to traumatic L1 fracture 09/29/2013  .  Chronic idiopathic thrombocytopenia ([place]) 08/29/2013  . HTN (hypertension)     Orientation RESPIRATION BLADDER Height & Weight     Self, Situation, Time, Place  Normal Continent Weight: 87.1 kg Height:  5' (152.4 cm)  BEHAVIORAL SYMPTOMS/MOOD NEUROLOGICAL BOWEL NUTRITION STATUS      Continent Diet (regular)  AMBULATORY STATUS COMMUNICATION OF NEEDS Skin   Extensive Assist Verbally PU Stage and Appropriate Care (open sore on the right lower leg)                       Personal Care Assistance Level of Assistance  Bathing, Feeding, Dressing Bathing Assistance: Limited assistance Feeding assistance: Limited assistance Dressing Assistance: Limited assistance     Functional Limitations Info  Sight, Hearing, Speech Sight Info: Adequate Hearing Info: Adequate Speech Info: Adequate    SPECIAL CARE FACTORS FREQUENCY  PT (By licensed PT), OT (By licensed OT)     PT Frequency: 5-7 x weekly OT Frequency: 5-7 times weekly            Contractures Contractures Info: Not present    Additional Factors Info  Code Status Code Status Info: full             Current Medications (07/05/2020):  This is the current hospital active medication list Current Facility-Administered Medications  Medication Dose Route Frequency Provider Last Rate Last Admin  . 0.9 %  sodium chloride infusion   Intravenous Continuous Kipp Brood, MD   Paused at 07/02/20 1117  . acetaminophen (TYLENOL) tablet 650 mg  650 mg Oral Q6H PRN Mannam, Praveen, MD   650 mg at 07/04/20 1516  .  albuterol (VENTOLIN HFA) 108 (90 Base) MCG/ACT inhaler 1 puff  1 puff Inhalation Q4H PRN Arrien, Jimmy Picket, MD      . ascorbic acid (VITAMIN C) tablet 500 mg  500 mg Oral Daily Allie Bossier, MD   500 mg at 07/04/20 1037  . carvedilol (COREG) tablet 6.25 mg  6.25 mg Oral BID WC Allie Bossier, MD   6.25 mg at 07/05/20 7793  . chlorhexidine (PERIDEX) 0.12 % solution 15 mL  15 mL Mouth Rinse BID Mannam, Praveen, MD    15 mL at 07/04/20 1037  . Chlorhexidine Gluconate Cloth 2 % PADS 6 each  6 each Topical Daily Marshell Garfinkel, MD   6 each at 07/04/20 1517  . dexamethasone (DECADRON) tablet 6 mg  6 mg Oral Q breakfast Allie Bossier, MD   6 mg at 07/05/20 9030  . docusate sodium (COLACE) capsule 100 mg  100 mg Oral BID PRN Kipp Brood, MD      . enoxaparin (LOVENOX) injection 40 mg  40 mg Subcutaneous Q24H Mannam, Praveen, MD   40 mg at 07/04/20 1516  . furosemide (LASIX) injection 80 mg  80 mg Intravenous Daily Kipp Brood, MD   80 mg at 07/04/20 1138  . guaiFENesin-dextromethorphan (ROBITUSSIN DM) 100-10 MG/5ML syrup 5 mL  5 mL Oral Q4H PRN Arrien, Jimmy Picket, MD      . influenza vaccine adjuvanted (FLUAD) injection 0.5 mL  0.5 mL Intramuscular Tomorrow-1000 Mannam, Praveen, MD      . Ipratropium-Albuterol (COMBIVENT) respimat 2 puff  2 puff Inhalation Q6H Arrien, Jimmy Picket, MD   2 puff at 07/05/20 [phone]  . lip balm (CARMEX) ointment   Topical PRN Mannam, Praveen, MD      . lisinopril (ZESTRIL) tablet 2.5 mg  2.5 mg Oral Daily Allie Bossier, MD   2.5 mg at 07/04/20 1037  . MEDLINE mouth rinse  15 mL Mouth Rinse q12n4p Mannam, Praveen, MD   15 mL at 07/04/20 1517  . nystatin (MYCOSTATIN/NYSTOP) topical powder   Topical BID Allie Bossier, MD   Given at 07/04/20 1037  . pneumococcal 23 valent vaccine (PNEUMOVAX-23) injection 0.5 mL  0.5 mL Intramuscular Tomorrow-1000 Mannam, Praveen, MD      . polyethylene glycol (MIRALAX / GLYCOLAX) packet 17 g  17 g Oral Daily PRN Agarwala, Ravi, MD      . zinc sulfate capsule 220 mg  220 mg Oral Daily Allie Bossier, MD   220 mg at 07/04/20 1037     Discharge Medications: Please see discharge summary for a list of discharge medications.  Relevant Imaging Results:  Relevant Lab Results:   Additional Information SSN: [id number].  Leeroy Cha, RN

## 2020-07-05 NOTE — TOC Progression Note (Signed)
Transition of Care [facility]) - Progression Note    Patient Details  Name: [name] MRN: [id number] Date of Birth: [birth year]  Transition of Care [facility]) CM/SW Contact  Leeroy Cha, RN Phone Number: 07/05/2020, 2:21 PM  Clinical Narrative:    [facility] Preferred SNF  Pending - Request Sent N/A [address] [place] [address] [phone] --  [facility] PLACE Preferred SNF  Pending - Request Sent N/A [address], [place] [address] [phone] --  [facility] [place] [facility] [place] [facility]  Pending - Request Sent N/A [address] [place] [address] [phone] --  [facility] Preferred SN[facility]  Pending - Request Sent N/A [address], [place] [address] [phone] --  [facility] Preferred SNF  Pending - Request Sent N/A [address], [place] [address] [phone] --  [facility] Preferred SNF  Pending - Request Sent N/A [address], [place] [address] [phone] --  [facility]  Pending - Request Sent N/A [address] [place] [address] [phone] --  [facility]ACE Preferred SNF  Pending - Request Sent N/A [address], [place] [address] [phone] --  [facility] [place] not in network N/A [address] [place] [address] [phone] --  [facility]  Declined  COVID unit call [place], [place] [address] [phone] --  [place] Preferred SNF  Declined  No reason given N/A [address], [place] [address] [place]  No bed availablity N/A [address], [place] [address] [phone] --  [facility]   Declined  No bed availablity N/A [address], [place]  [address] [phone] --  [place] Preferred SNF  [place] exceeding faciltity capability N/A [address], [place]  [address] [phone] --      Expected Discharge Plan: [place] Barriers to Discharge: Barriers Unresolved (comment)  Expected Discharge Plan and Services Expected Discharge Plan: [place]   Discharge Planning Services: CM Consult   Living arrangements for the past 2 months: Single Family Home                                       Social Determinants of Health (SDOH) Interventions    Readmission Risk Interventions No flowsheet data found.

## 2020-07-05 NOTE — Progress Notes (Signed)
PROGRESS NOTE    [name]  EXB:[id number] DOB: 05/10/1951 DOA: 06/29/2020 PCP: [name], MD    Brief Narrative:  [name] was admitted with a working diagnosis of acute hypoxic/hypercapnic respiratory failure due to SARS COVID-19 viral pneumonia.  69 year old female was brought to the hospital due to oxygen saturation down to the 80s, she was unable to give any detailed history due to metabolic encephalopathy.  On her initial physical examination blood pressure 124/52, heart rate 116, temperature 99.4, respiratory rate 22, oxygen saturation 100% supplemental oxygen per noninvasive mechanical ventilation.  Heart S1-S2, present rhythmic, his lungs had bilateral wheezing, abdomen protuberant, soft nontender, positive bilateral lower extremity edema.   Chest radiograph with right lower lobe opacity with small right pleural effusion, left lower lobe interstitial infiltrate.   Patient has been medically treated with remdesivir, and systemic corticosteroids.    She has been very weak and deconditioned, will need SNF for discharge.    Assessment & Plan:   Principal Problem:   Pneumonia due to COVID-19 virus Active Problems:   HTN (hypertension)   Chronic idiopathic thrombocytopenia (HCC)   GERD (gastroesophageal reflux disease)   Obesity hypoventilation syndrome (HCC)   Acute on chronic respiratory failure with hypoxia and hypercapnia (HCC)   1.  Acute on chronic hypoxic/hypercapnic respiratory failure due to SARS COVID-19 viral pneumonia. Sp remdesivir #5/5  RR: 21 Pulse oxymetry: 92%  Fi02: 8 L/ min per HFNC down to 5L/min   COVID-19 Labs  Recent Labs    07/03/20 0732 07/04/20 0248 07/05/20 0414  DDIMER 0.30 <0.27 <0.27  FERRITIN 503* 99 141  LDH 147 145  --   CRP <0.5 0.6 <0.5    Lab Results  Component Value Date   SARSCOV2NAA POSITIVE (A) 06/29/2020   SARSCOV2NAA NEGATIVE 05/14/2020   SARSCOV2NAA NEGATIVE 04/29/2020   SARSCOV2NAA NEGATIVE  04/18/2020     Improved oxygenation from 15 down to 8 and 5 L/ min per HFNC. Patient needs aggressive mobility and airway clearing techniques.  Continue with systemic steroids with dexamethasone 6 mg daily, bronchodilator and antitussive agents.  Continue PT/OT and out of bed to chair tid with meals. Patient has declined SNF, will arrange for home health services.   2. HTN. Continue with lisinopril and carvedilol for blood pressure control, will transition to oral furosemide, home dose of 80 po daily.   3. Hypokalemia/ hypophosphatemia and hypomagnesemia/ metabolic alkalosis. Stable renal function with serum cr at 0,40 with K at 3,7 and bicarbonate at 47.  Improved volume status, will continue with oral furosemide. Urine output over last 24 H is 1,750 ml, with a net negative fluid balance of 10,336 ml. Add 40 meq Kcl this am.   4. Obesity class 3/ OSA. Calculated BMI is 40.0. patient has been declining Cpap.    5. Chronic lymphedema lower extremities and ambulatory dysfunction. On compression bandages.  Continue to encourage mobility.   6. Stage 2 pressure ulcer at the nose. Present on admission.     Status is: Inpatient  Remains inpatient appropriate because:IV treatments appropriate due to intensity of illness or inability to take PO   Dispo: The patient is from: Home              Anticipated d/c is to: Home              Anticipated d/c date is: 2 days              Patient currently is not medically stable to d/c.  Plan to return home once tolerating 5 L per min of supplemental 02 per [place]    DVT prophylaxis: Enoxaparin   Code Status:   full  Family Communication:  I spoke over the phone with the patient's daughter about patient's  condition, plan of care, prognosis and all questions were addressed.     Skin Documentation: Pressure Injury 04/22/20 Sacrum Medial Stage 2 -  Partial thickness loss of dermis presenting as a shallow open injury with a red, pink wound bed  without slough. blanching redness with small open area (Active)  04/22/20 1553  Location: Sacrum  Location Orientation: Medial  Staging: Stage 2 -  Partial thickness loss of dermis presenting as a shallow open injury with a red, pink wound bed without slough.  Wound Description (Comments): blanching redness with small open area  Present on Admission:      Pressure Injury 04/22/20 Thigh Posterior;Proximal;Right Stage 2 -  Partial thickness loss of dermis presenting as a shallow open injury with a red, pink wound bed without slough. small open area (Active)  04/22/20 1555  Location: Thigh  Location Orientation: Posterior;Proximal;Right  Staging: Stage 2 -  Partial thickness loss of dermis presenting as a shallow open injury with a red, pink wound bed without slough.  Wound Description (Comments): small open area  Present on Admission:      Pressure Injury 05/14/20 Buttocks Left;Medial Stage 2 -  Partial thickness loss of dermis presenting as a shallow open injury with a red, pink wound bed without slough. (Active)  05/14/20 0510  Location: Buttocks  Location Orientation: Left;Medial  Staging: Stage 2 -  Partial thickness loss of dermis presenting as a shallow open injury with a red, pink wound bed without slough.  Wound Description (Comments):   Present on Admission: Yes     Pressure Injury 06/30/20 Nose Medial Stage 2 -  Partial thickness loss of dermis presenting as a shallow open injury with a red, pink wound bed without slough. (Active)  06/30/20 1230  Location: Nose  Location Orientation: Medial  Staging: Stage 2 -  Partial thickness loss of dermis presenting as a shallow open injury with a red, pink wound bed without slough.  Wound Description (Comments):   Present on Admission: Yes     Subjective: Patient is very weak and deconditioned, no nausea or vomiting, dyspnea has been improving but not yet back to baseline,   Objective: Vitals:   07/04/20 2108 07/05/20 0110  07/05/20 0500 07/05/20 0800  BP: 114/77 122/70    Pulse: 82 79    Resp: 15 (!) 21    Temp: 98.7 F (37.1 C) 98.8 F (37.1 C)    TempSrc: Other (Comment) Oral    SpO2: 94% 90%  92%  Weight:   87.1 kg   Height:        Intake/Output Summary (Last 24 hours) at 07/05/2020 1003 Last data filed at 07/05/2020 1000 Gross per 24 hour  Intake --  Output 2350 ml  Net -2350 ml   Filed Weights   07/01/20 0459 07/03/20 0500 07/05/20 0500  Weight: 95.6 kg 93 kg 87.1 kg    Examination:   General: Not in pain or dyspnea, deconditioned  Neurology: Awake and alert, non focal  E ENT: mild pallor, no icterus, oral mucosa moist Cardiovascular: No JVD. S1-S2 present, rhythmic, no gallops, rubs, or murmurs. No lower extremity edema. Pulmonary: positive breath sounds bilaterally, decreased breath sounds at the dependent zones with no wheezing, rhonchi or rales. Gastrointestinal. Abdomen protuberant,  soft and non tender.  Skin. No rashes Musculoskeletal: no joint deformities     Data Reviewed: I have personally reviewed following labs and imaging studies  CBC: Recent Labs  Lab 06/29/20 1540 06/29/20 1817 06/29/20 2249 06/30/20 0123 06/30/20 2229 07/01/20 0823 07/02/20 0653 07/03/20 0732 07/04/20 0248  WBC 6.3   < > 6.2  --   --  3.9* 3.0* 9.0 7.0  NEUTROABS 4.6  --   --   --   --   --   --  7.3 5.6  HGB 11.3*   < > 10.6*   < > 10.5* 11.2* 10.6* 11.8* 11.7*  HCT 38.4   < > 36.4   < > 31.0* 36.7 35.0* 38.7 38.7  MCV 96.5   < > 97.1  --   --  97.9 96.2 95.8 95.8  PLT 58*   < > 55*  --   --  59* 51* 68* 57*   < > = values in this interval not displayed.   Basic Metabolic Panel: Recent Labs  Lab 07/01/20 0823 07/01/20 0823 07/02/20 0653 07/02/20 1642 07/03/20 0732 07/04/20 0248 07/05/20 0414  NA 141  --  142  --  143 142 142  K 3.9   < > 2.9* 3.6 3.7 3.7 3.7  CL 96*  --  91*  --  86* 81* 82*  CO2 34*  --  42*  --  49* 50* 47*  GLUCOSE 109*  --  138*  --  109* 130* 120*   BUN 23  --  24*  --  17 13 16   CREATININE 0.52  --  0.50  --  0.38* 0.33* 0.40*  CALCIUM 8.2*  --  8.1*  --  8.1* 7.9* 8.7*  MG  --   --  2.0 1.7 1.9 2.5*  --   PHOS  --   --  1.4* 3.6 2.6 2.0*  --    < > = values in this interval not displayed.   GFR: Estimated Creatinine Clearance: 66.9 mL/min (A) (by C-G formula based on SCr of 0.4 mg/dL (L)). Liver Function Tests: Recent Labs  Lab 07/01/20 0823 07/02/20 0653 07/03/20 0732 07/04/20 0248 07/05/20 0414  AST 22 14* 16 14* 16  ALT 17 16 16 16 17   ALKPHOS 59 58 60 56 55  BILITOT 0.9 1.0 1.3* 1.9* 2.3*  PROT 5.3* 5.3* 5.5* 5.3* 5.8*  ALBUMIN 3.0* 3.1* 3.2* 3.3* 3.5   No results for input(s): LIPASE, AMYLASE in the last 168 hours. No results for input(s): AMMONIA in the last 168 hours. Coagulation Profile: Recent Labs  Lab 06/29/20 1427  INR 1.0   Cardiac Enzymes: No results for input(s): CKTOTAL, CKMB, CKMBINDEX, TROPONINI in the last 168 hours. BNP (last 3 results) No results for input(s): PROBNP in the last 8760 hours. HbA1C: No results for input(s): HGBA1C in the last 72 hours. CBG: No results for input(s): GLUCAP in the last 168 hours. Lipid Profile: No results for input(s): CHOL, HDL, LDLCALC, TRIG, CHOLHDL, LDLDIRECT in the last 72 hours. Thyroid Function Tests: No results for input(s): TSH, T4TOTAL, FREET4, T3FREE, THYROIDAB in the last 72 hours. Anemia Panel: Recent Labs    07/04/20 0248 07/05/20 0414  FERRITIN 99 141      Radiology Studies: I have reviewed all of the imaging during this hospital visit personally     Scheduled Meds: . vitamin C  500 mg Oral Daily  . carvedilol  6.25 mg Oral BID WC  . chlorhexidine  15 mL Mouth Rinse BID  . Chlorhexidine Gluconate Cloth  6 each Topical Daily  . dexamethasone  6 mg Oral Q breakfast  . enoxaparin (LOVENOX) injection  40 mg Subcutaneous Q24H  . furosemide  80 mg Intravenous Daily  . influenza vaccine adjuvanted  0.5 mL Intramuscular Tomorrow-1000   . Ipratropium-Albuterol  2 puff Inhalation Q6H  . lisinopril  2.5 mg Oral Daily  . mouth rinse  15 mL Mouth Rinse q12n4p  . nystatin   Topical BID  . pneumococcal 23 valent vaccine  0.5 mL Intramuscular Tomorrow-1000  . zinc sulfate  220 mg Oral Daily   Continuous Infusions: . sodium chloride Stopped (07/02/20 1117)     LOS: 6 days        Dallen Bunte Gerome Apley, MD

## 2020-07-05 NOTE — TOC Progression Note (Signed)
Transition of Care [facility]) - Progression Note    Patient Details  Name: [name] MRN: [id number] Date of Birth: June 15, 1951  [facility] CM/SW Contact  Leeroy Cha, RN Phone Number: 07/05/2020, 9:28 AM  Clinical Narrative:    passar number [phone] A [phone] fl2 sent out to area snf's at 0928.   Expected Discharge Plan: [place] Barriers to Discharge: Barriers Unresolved (comment)  Expected Discharge Plan and Services Expected Discharge Plan: [place]   Discharge Planning Services: CM Consult   Living arrangements for the past 2 months: Single Family Home                                       Social Determinants of Health (SDOH) Interventions    Readmission Risk Interventions No flowsheet data found.

## 2020-07-06 DIAGNOSIS — K2100 Gastro-esophageal reflux disease with esophagitis, without bleeding: Secondary | ICD-10-CM | POA: Diagnosis not present

## 2020-07-06 DIAGNOSIS — U071 COVID-19: Secondary | ICD-10-CM | POA: Diagnosis not present

## 2020-07-06 DIAGNOSIS — D693 Immune thrombocytopenic purpura: Secondary | ICD-10-CM | POA: Diagnosis not present

## 2020-07-06 DIAGNOSIS — J9621 Acute and chronic respiratory failure with hypoxia: Secondary | ICD-10-CM | POA: Diagnosis not present

## 2020-07-06 LAB — COMPREHENSIVE METABOLIC PANEL
ALT: 15 U/L (ref 0–44)
AST: 13 U/L — ABNORMAL LOW (ref 15–41)
Albumin: 3.4 g/dL — ABNORMAL LOW (ref 3.5–5.0)
Alkaline Phosphatase: 54 U/L (ref 38–126)
Anion gap: 13 (ref 5–15)
BUN: 26 mg/dL — ABNORMAL HIGH (ref 8–23)
CO2: 41 mmol/L — ABNORMAL HIGH (ref 22–32)
Calcium: 8.6 mg/dL — ABNORMAL LOW (ref 8.9–10.3)
Chloride: 86 mmol/L — ABNORMAL LOW (ref 98–111)
Creatinine, Ser: 0.44 mg/dL (ref 0.44–1.00)
GFR, Estimated: >60 mL/min (ref >60)
Glucose, Bld: 138 mg/dL — ABNORMAL HIGH (ref 70–99)
Potassium: 3.9 mmol/L (ref 3.5–5.1)
Sodium: 140 mmol/L (ref 135–145)
Total Bilirubin: 2 mg/dL — ABNORMAL HIGH (ref 0.3–1.2)
Total Protein: 5.6 g/dL — ABNORMAL LOW (ref 6.5–8.1)

## 2020-07-06 LAB — C-REACTIVE PROTEIN: CRP: 0.6 mg/dL (ref <1.0)

## 2020-07-06 LAB — D-DIMER, QUANTITATIVE: D-Dimer, Quant: <0.27 ug/mL-FEU (ref 0.00–0.50)

## 2020-07-06 LAB — FERRITIN: Ferritin: 144 ng/mL (ref 11–307)

## 2020-07-06 NOTE — Evaluation (Signed)
Occupational Therapy Evaluation Patient Details Name: [name] MRN: [id number] DOB: 1951/04/18 Today's Date: 07/06/2020    History of Present Illness 69 yo female admitted wit COVID Pna, encephalopathy. Hx of obesity hypoventilation syndrome   Clinical Impression   Mrs. [name] is a 69 year old woman who presents with generalized weakness and decreased activity tolerance resulting in a decline in functional abilities.  Patient reports having assistance for ADLs at home and a predominantly sedentary lifestyle as well as being on 3 L [place].Patient found on 4 L with o2 sat 85%. O2 increased to 6-8 liters while patient performing activity. Patient mod assist to stand with RW and min guard to ambulate. Patient max assist for LB ADLs and toileting and set up assist for UB ADLs. Patient's oxygen 88% on 8 liters to walk to the bathroom. Patient decreased to 6 Liters and dropped to approximately (poor pleth signal) 80% with toileting task but recovers well to 85-86%. Patient will benefit from skilled OT services while in hospital in order to improve deficits, learn compensatory strategies for ADLs, improve activity tolerance and cardiopulmonary status  to reduce oxygenation needs in order for patient to return home at discharge. Patient reports refusal to return to SNF.      Follow Up Recommendations  Home health OT    Equipment Recommendations  None recommended by OT    Recommendations for Other Services       Precautions / Restrictions Precautions Precautions: Fall Precaution Comments: monitor sats Restrictions Weight Bearing Restrictions: No      Mobility Bed Mobility Overal bed mobility: Needs Assistance Bed Mobility: Supine to Sit     Supine to sit: Max assist;+2 for physical assistance;+2 for safety/equipment          Transfers Overall transfer level: Needs assistance Equipment used: Rolling walker (2 wheeled) Transfers: Sit to/from Stand Sit to Stand: From  elevated surface;Mod assist         General transfer comment: Min guard to ambulate to bathroom with RW- very slowly. walker too heavy for patient.    Balance Overall balance assessment: Mild deficits observed, not formally tested                                         ADL either performed or assessed with clinical judgement   ADL Overall ADL's : Needs assistance/impaired Eating/Feeding: Independent   Grooming: Set up;Wash/dry hands;Wash/dry face;Sitting   Upper Body Bathing: Set up;Sitting   Lower Body Bathing: Maximal assistance;Set up;Sit to/from stand   Upper Body Dressing : Set up;Sitting   Lower Body Dressing: Maximal assistance;Set up;Sit to/from stand   Toilet Transfer: Moderate assistance;Regular Toilet;Grab bars;Ambulation;RW Toilet Transfer Details (indicate cue type and reason): required mod assist to stand from lower toilet. Toileting- Clothing Manipulation and Hygiene: Maximal assistance Toileting - Clothing Manipulation Details (indicate cue type and reason): Required assistance for pericare. Could assist with managing clothing.     Functional mobility during ADLs: Min guard;Rolling walker General ADL Comments: min guard to ambulate     Vision Patient Visual Report: No change from baseline       Perception     Praxis      Pertinent Vitals/Pain Pain Assessment: No/denies pain     Hand Dominance Right   Extremity/Trunk Assessment Upper Extremity Assessment Upper Extremity Assessment: Generalized weakness   Lower Extremity Assessment Lower Extremity Assessment: Defer to PT evaluation  Cervical / Trunk Assessment Cervical / Trunk Assessment: Kyphotic   Communication Communication Communication: No difficulties   Cognition Arousal/Alertness: Awake/alert Behavior During Therapy: WFL for tasks assessed/performed Overall Cognitive Status: No family/caregiver present to determine baseline cognitive functioning Area of  Impairment: Memory;Awareness                   Current Attention Level: Sustained Memory: Decreased short-term memory Following Commands: Follows one step commands consistently Safety/Judgement: Decreased awareness of deficits;Decreased awareness of safety Awareness: Emergent Problem Solving: Requires verbal cues General Comments: Patient answers a lot of questions with "I don't know" such as do you have help with going to the bathroom. Requires verbal cues for sequencing - as she states "what's next" for each task.   General Comments       Exercises     Shoulder Instructions      Home Living Family/patient expects to be discharged to:: Private residence Living Arrangements: Children Available Help at Discharge: Available PRN/intermittently;Family Type of Home: House Home Access: Stairs to enter [employer] of Steps: 3   Home Layout: One level     Bathroom Shower/Tub: Tub/shower unit;Other (comment)   Bathroom Toilet: Standard     Home Equipment: Cane - single point;Bedside commode   Additional Comments: Reports sleeping in lift chair at home, uses lift for sit to stand.      Prior Functioning/Environment Level of Independence: Needs assistance  Gait / Transfers Assistance Needed: Reports using a walker to ambulate and has a lift chair for sit to stand. ADL's / Homemaking Assistance Needed: Reports having assistance with dressing and bathing (reports "rehab tech" (home CNA?) or her daughter does. Patient originally reports being able to ambulate to the bathroom and perform toileting by herself but then states aid/daugher helps her. Answers a lot of questions with "I don't know."            OT Problem List: Decreased activity tolerance;Decreased strength;Impaired balance (sitting and/or standing);Decreased knowledge of use of DME or AE;Decreased safety awareness;Cardiopulmonary status limiting activity      OT Treatment/Interventions: Self-care/ADL  training;Therapeutic exercise;DME and/or AE instruction;Patient/family education;Therapeutic activities;Balance training    OT Goals(Current goals can be found in the care plan section) Acute Rehab OT Goals Patient Stated Goal: to go home at discharge OT Goal Formulation: With patient Time For Goal Achievement: 07/20/20 Potential to Achieve Goals: Good  OT Frequency: Min 2X/week   Barriers to D/C:            Co-evaluation PT/OT/SLP Co-Evaluation/Treatment: Yes Reason for Co-Treatment: For patient/therapist safety;To address functional/ADL transfers PT goals addressed during session: Mobility/safety with mobility OT goals addressed during session: ADL's and self-care      AM-PAC OT "6 Clicks" Daily Activity     Outcome Measure Help from another person eating meals?: None Help from another person taking care of personal grooming?: A Little Help from another person toileting, which includes using toliet, bedpan, or urinal?: A Lot Help from another person bathing (including washing, rinsing, drying)?: A Lot Help from another person to put on and taking off regular upper body clothing?: A Little Help from another person to put on and taking off regular lower body clothing?: A Lot 6 Click Score: 16   End of Session Equipment Utilized During Treatment: Gait belt;Oxygen;Rolling walker Nurse Communication: Mobility status  Activity Tolerance: Patient limited by fatigue Patient left: in chair;with call bell/phone within reach;with chair alarm set  OT Visit Diagnosis: Unsteadiness on feet (R26.81);Muscle weakness (generalized) (M62.81)  Time: 8333-8329 OT Time Calculation (min): 41 min Charges:  OT General Charges $OT Visit: 1 Visit OT Evaluation $OT Eval Moderate Complexity: 1 Mod  Osie Merkin, OTR/L [place]  Office [phone] Pager: [place] 07/06/2020, 1:49 PM

## 2020-07-06 NOTE — Progress Notes (Signed)
PROGRESS NOTE    [name]  SNK:[id number] DOB: [birth year] DOA: 06/29/2020 PCP: [name], MD    Brief Narrative:  Mrs. Boernerwas admitted with a working diagnosis of acute hypoxic/hypercapnic respiratory failure due to SARS COVID-19 viral pneumonia.  69 year old female was brought to the hospital due to oxygen saturation down to the 80s, she was unable to give any detailed history due to metabolic encephalopathy.On her initial physical examination blood pressure 124/52, heart rate 116, temperature 99.4, respiratory rate 22, oxygen saturation 100% supplemental oxygen per noninvasive mechanical ventilation. Heart S1-S2, present rhythmic, his lungs had bilateral wheezing, abdomen protuberant, soft nontender, positive bilateral lower extremity edema.  Chest radiograph withrightlower lobe opacity withsmall right pleural effusion, left lower lobe interstitial infiltrate.  Patient has been medically treated with remdesivir, and systemic corticosteroids.  She received IV furosemide for diuresis with good toleration, non cardiogenic pulmonary edema.   She has been very weak and deconditioned, will need SNF for discharge. Patient has declined SNF placement. At home uses 3 L/min per [place] of supplemental 02.    Assessment & Plan:   Principal Problem:   Pneumonia due to COVID-19 virus Active Problems:   HTN (hypertension)   Chronic idiopathic thrombocytopenia (HCC)   GERD (gastroesophageal reflux disease)   Obesity hypoventilation syndrome (HCC)   Acute on chronic respiratory failure with hypoxia and hypercapnia (HCC)    1.Acute on chronic hypoxic/hypercapnic respiratory failure due to SARS COVID-19 viral pneumonia. Sp remdesivir #5/5  RR: 24  Pulse oxymetry: 90  Fi02: 5 L/ HFNC  COVID-19 Labs  Recent Labs    07/04/20 0248 07/05/20 0414 07/06/20 0410  DDIMER <0.27 <0.27 <0.27  FERRITIN 99 141 144  LDH 145  --   --   CRP 0.6 <0.5 0.6    Lab  Results  Component Value Date   SARSCOV2NAA POSITIVE (A) 06/29/2020   SARSCOV2NAA NEGATIVE 05/14/2020   [place] NEGATIVE 04/29/2020   [place] NEGATIVE 04/18/2020    This am patient is out of bed to chair, her dyspnea has improved but not yet back to baseline. Decrease oxygen requirements. Continue with aggressive mobility and airway clearing techniques.  Tolerating weill steroids with dexamethasone 6 mg daily, bronchodilator and antitussive agents.  Patient continue to decline SNF, will plan discharge home in the next 48 if she continues to improve.   2. HTN. Continue blood pressure control with lisinopril, furosemide and carvedilol.  3. Hypokalemia/ hypophosphatemia and hypomagnesemia/ metabolic alkalosis.Tolerating po well, K this am is 3,9 and bicarbonate is 41, cr is 0,44.   Continue close monitoring of renal function and electrolytes, now on oral furosemide.   4. Obesity class 3/ OSA. Calculated BMI is 40.0/ Follow as outpatient.   5. Chronic lymphedema lower extremities and ambulatory dysfunction. Continue with  compression bandages.   6. Stage 2 pressure ulcer at the nose. Present on admission.Local skin care,     Status is: Inpatient  Remains inpatient appropriate because:Inpatient level of care appropriate due to severity of illness   Dispo: The patient is from: Home              Anticipated d/c is to: Home              Anticipated d/c date is: 2 days              Patient currently is not medically stable to d/c.   DVT prophylaxis: Enoxaparin   Code Status:   full  Family Communication:  I spoke over the phone  with the patient's daughter about patient's  condition, plan of care, prognosis and all questions were addressed.    Nutrition Status:           Skin Documentation: Pressure Injury 04/22/20 Sacrum Medial Stage 2 -  Partial thickness loss of dermis presenting as a shallow open injury with a red, pink wound bed without slough. blanching  redness with small open area (Active)  04/22/20 1553  Location: Sacrum  Location Orientation: Medial  Staging: Stage 2 -  Partial thickness loss of dermis presenting as a shallow open injury with a red, pink wound bed without slough.  Wound Description (Comments): blanching redness with small open area  Present on Admission:      Pressure Injury 04/22/20 Thigh Posterior;Proximal;Right Stage 2 -  Partial thickness loss of dermis presenting as a shallow open injury with a red, pink wound bed without slough. small open area (Active)  04/22/20 1555  Location: Thigh  Location Orientation: Posterior;Proximal;Right  Staging: Stage 2 -  Partial thickness loss of dermis presenting as a shallow open injury with a red, pink wound bed without slough.  Wound Description (Comments): small open area  Present on Admission:      Pressure Injury 05/14/20 Buttocks Left;Medial Stage 2 -  Partial thickness loss of dermis presenting as a shallow open injury with a red, pink wound bed without slough. (Active)  05/14/20 0510  Location: Buttocks  Location Orientation: Left;Medial  Staging: Stage 2 -  Partial thickness loss of dermis presenting as a shallow open injury with a red, pink wound bed without slough.  Wound Description (Comments):   Present on Admission: Yes     Pressure Injury 06/30/20 Nose Medial Stage 2 -  Partial thickness loss of dermis presenting as a shallow open injury with a red, pink wound bed without slough. (Active)  06/30/20 1230  Location: Nose  Location Orientation: Medial  Staging: Stage 2 -  Partial thickness loss of dermis presenting as a shallow open injury with a red, pink wound bed without slough.  Wound Description (Comments):   Present on Admission: Yes     Subjective: Patient is out of bed to chair, tolerating po well, no nausea or vomiting.   Objective: Vitals:   07/05/20 1341 07/05/20 1928 07/06/20 0315 07/06/20 0500  BP:  (!) 105/57 112/72   Pulse: 67 80 82    Resp: 16 (!) 24 (!) 24   Temp: 98.1 F (36.7 C) 98.3 F (36.8 C) 97.8 F (36.6 C)   TempSrc:  Oral Axillary   SpO2:  90% (!) 86%   Weight:    81.6 kg  Height:        Intake/Output Summary (Last 24 hours) at 07/06/2020 1111 Last data filed at 07/06/2020 0330 Gross per 24 hour  Intake 592 ml  Output 1600 ml  Net -1008 ml   Filed Weights   07/03/20 0500 07/05/20 0500 07/06/20 0500  Weight: 93 kg 87.1 kg 81.6 kg    Examination:   General: Not in pain or dyspnea, deconditioned  Neurology: Awake and alert, non focal  E ENT: no pallor, no icterus, oral mucosa moist Cardiovascular: No JVD. S1-S2 present, rhythmic, no gallops, rubs, or murmurs. Non pitting lower extremity edema/ compression bandages in place.  Pulmonary: positive breath sounds bilaterally, adequate air movement, no wheezing, rhonchi or rales. Gastrointestinal. Abdomen soft and non tender Skin. No rashes Musculoskeletal: no joint deformities     Data Reviewed: I have personally reviewed following labs and imaging studies  CBC: Recent Labs  Lab 06/29/20 1540 06/29/20 1817 06/29/20 2249 06/30/20 0123 06/30/20 6644 07/01/20 0823 07/02/20 0653 07/03/20 0732 07/04/20 0248  WBC 6.3   < > 6.2  --   --  3.9* 3.0* 9.0 7.0  NEUTROABS 4.6  --   --   --   --   --   --  7.3 5.6  HGB 11.3*   < > 10.6*   < > 10.5* 11.2* 10.6* 11.8* 11.7*  HCT 38.4   < > 36.4   < > 31.0* 36.7 35.0* 38.7 38.7  MCV 96.5   < > 97.1  --   --  97.9 96.2 95.8 95.8  PLT 58*   < > 55*  --   --  59* 51* 68* 57*   < > = values in this interval not displayed.   Basic Metabolic Panel: Recent Labs  Lab 07/02/20 0653 07/02/20 0653 07/02/20 1642 07/03/20 0732 07/04/20 0248 07/05/20 0414 07/06/20 0410  NA 142  --   --  143 142 142 140  K 2.9*   < > 3.6 3.7 3.7 3.7 3.9  CL 91*  --   --  86* 81* 82* 86*  CO2 42*  --   --  49* 50* 47* 41*  GLUCOSE 138*  --   --  109* 130* 120* 138*  BUN 24*  --   --  17 13 16  26*  CREATININE 0.50  --    --  0.38* 0.33* 0.40* 0.44  CALCIUM 8.1*  --   --  8.1* 7.9* 8.7* 8.6*  MG 2.0  --  1.7 1.9 2.5*  --   --   PHOS 1.4*  --  3.6 2.6 2.0*  --   --    < > = values in this interval not displayed.   GFR: Estimated Creatinine Clearance: 64.5 mL/min (by C-G formula based on SCr of 0.44 mg/dL). Liver Function Tests: Recent Labs  Lab 07/02/20 0653 07/03/20 0732 07/04/20 0248 07/05/20 0414 07/06/20 0410  AST 14* 16 14* 16 13*  ALT 16 16 16 17 15   ALKPHOS 58 60 56 55 54  BILITOT 1.0 1.3* 1.9* 2.3* 2.0*  PROT 5.3* 5.5* 5.3* 5.8* 5.6*  ALBUMIN 3.1* 3.2* 3.3* 3.5 3.4*   No results for input(s): LIPASE, AMYLASE in the last 168 hours. No results for input(s): AMMONIA in the last 168 hours. Coagulation Profile: Recent Labs  Lab 06/29/20 1427  INR 1.0   Cardiac Enzymes: No results for input(s): CKTOTAL, CKMB, CKMBINDEX, TROPONINI in the last 168 hours. BNP (last 3 results) No results for input(s): PROBNP in the last 8760 hours. HbA1C: No results for input(s): HGBA1C in the last 72 hours. CBG: No results for input(s): GLUCAP in the last 168 hours. Lipid Profile: No results for input(s): CHOL, HDL, LDLCALC, TRIG, CHOLHDL, LDLDIRECT in the last 72 hours. Thyroid Function Tests: No results for input(s): TSH, T4TOTAL, FREET4, T3FREE, THYROIDAB in the last 72 hours. Anemia Panel: Recent Labs    07/05/20 0414 07/06/20 0410  FERRITIN 141 144      Radiology Studies: I have reviewed all of the imaging during this hospital visit personally     Scheduled Meds: . vitamin C  500 mg Oral Daily  . carvedilol  6.25 mg Oral BID WC  . chlorhexidine  15 mL Mouth Rinse BID  . Chlorhexidine Gluconate Cloth  6 each Topical Daily  . dexamethasone  6 mg Oral Q breakfast  . enoxaparin (LOVENOX) injection  40 mg Subcutaneous Q24H  . furosemide  80 mg Oral Daily  . influenza vaccine adjuvanted  0.5 mL Intramuscular Tomorrow-1000  . Ipratropium-Albuterol  2 puff Inhalation Q6H  . lisinopril   2.5 mg Oral Daily  . mouth rinse  15 mL Mouth Rinse q12n4p  . nystatin   Topical BID  . pneumococcal 23 valent vaccine  0.5 mL Intramuscular Tomorrow-1000  . zinc sulfate  220 mg Oral Daily   Continuous Infusions: . sodium chloride Stopped (07/02/20 1117)     LOS: 7 days        Emeri Estill Gerome Apley, MD

## 2020-07-06 NOTE — Progress Notes (Signed)
Pt on 5L HFNC with saturations at 88%. Will continue to monitor.

## 2020-07-06 NOTE — Progress Notes (Signed)
Physical Therapy Treatment Patient Details Name: [name] MRN: [id number] DOB: Mar 31, 1951 Today's Date: 07/06/2020    History of Present Illness 69 yo female admitted with COVID Pna, encephalopathy. Hx of obesity hypoventilation syndrome, obesity.    PT Comments    Progressing with mobility. O2 80% on 6L HFNC, 88% on 8L HFNC with activity. EOS: 84-85% 5L HFNC at rest. Encouraged pt to sit up as much as she could tolerate. Will continue to follow and progress activity as able.    Follow Up Recommendations  SNF (HHPT + 24 hour supervision/assist if pt declines placement)     Equipment Recommendations  None recommended by PT    Recommendations for Other Services       Precautions / Restrictions Precautions Precautions: Fall Precaution Comments: monitor sats Restrictions Weight Bearing Restrictions: No    Mobility  Bed Mobility Overal bed mobility: Needs Assistance Bed Mobility: Supine to Sit     Supine to sit: Mod assist;+2 for physical assistance;+2 for safety/equipment;HOB elevated     General bed mobility comments: Requires encouragement to do as much as she can for herself. Pt does sleep in a recliner at baseline (per pt report).   Transfers Overall transfer level: Needs assistance Equipment used: Rolling walker (2 wheeled) Transfers: Sit to/from Stand Sit to Stand: Mod assist         General transfer comment: Assist to power up, stabilize. Increased time. Cues for safety, technique, hand placement.  Ambulation/Gait Min Assist Gait Distance (Feet): 15 Feet Assistive device: Rolling walker (2 wheeled) Gait Pattern/deviations: Step-through pattern     General Gait Details: Assist to stabilize pt and to help manage RW (black bari walker too heavy and still too tall). O2 80% on 6L HFNC, 88% on 8L HFNC.   Stairs             Wheelchair Mobility    Modified Rankin (Stroke Patients Only)       Balance Overall balance assessment: Needs  assistance         Standing balance support: Bilateral upper extremity supported Standing balance-Leahy Scale: Poor                              Cognition Arousal/Alertness: Awake/alert Behavior During Therapy: WFL for tasks assessed/performed Overall Cognitive Status: No family/caregiver present to determine baseline cognitive functioning Area of Impairment: Problem solving                   Current Attention Level: Sustained Memory: Decreased short-term memory Following Commands: Follows one step commands consistently Safety/Judgement: Decreased awareness of deficits;Decreased awareness of safety Awareness: Emergent Problem Solving: Requires verbal cues General Comments: Patient answers a lot of questions with "I don't know" such as do you have help with going to the bathroom. Requires verbal cues for sequencing - as she states "what's next" for each task.      Exercises      General Comments        Pertinent Vitals/Pain Pain Assessment: No/denies pain    Home Living Family/patient expects to be discharged to:: Private residence Living Arrangements: Children Available Help at Discharge: Available PRN/intermittently;Family Type of Home: House Home Access: Stairs to enter   Home Layout: One level Home Equipment: Kasandra Knudsen - single point;Bedside commode Additional Comments: Reports sleeping in lift chair at home, uses lift for sit to stand.    Prior Function Level of Independence: Needs assistance  Gait / Transfers Assistance  Needed: Reports using a walker to ambulate and has a lift chair for sit to stand. ADL's / Homemaking Assistance Needed: Reports having assistance with dressing and bathing (reports "rehab tech" (home CNA?) or her daughter does. Patient originally reports being able to ambulate to the bathroom and perform toileting by herself but then states aid/daugher helps her. Answers a lot of questions with "I don't know."     PT Goals (current  goals can now be found in the care plan section) Acute Rehab PT Goals Patient Stated Goal: to go home at discharge Progress towards PT goals: Progressing toward goals    Frequency    Min 2X/week      PT Plan Current plan remains appropriate    Co-evaluation   Reason for Co-Treatment: For patient/therapist safety;To address functional/ADL transfers PT goals addressed during session: Mobility/safety with mobility OT goals addressed during session: ADL's and self-care      AM-PAC PT "6 Clicks" Mobility   Outcome Measure  Help needed turning from your back to your side while in a flat bed without using bedrails?: Total Help needed moving from lying on your back to sitting on the side of a flat bed without using bedrails?: Total Help needed moving to and from a bed to a chair (including a wheelchair)?: A Lot Help needed standing up from a chair using your arms (e.g., wheelchair or bedside chair)?: A Lot Help needed to walk in hospital room?: A Little Help needed climbing 3-5 steps with a railing? : Total 6 Click Score: 10    End of Session Equipment Utilized During Treatment: Oxygen Activity Tolerance: Patient tolerated treatment well Patient left: in chair;with call bell/phone within reach;with chair alarm set   PT Visit Diagnosis: Difficulty in walking, not elsewhere classified (R26.2);Muscle weakness (generalized) (M62.81)     Time: 7544-9201 PT Time Calculation (min) (ACUTE ONLY): 42 min  Charges:  $Gait Training: 8-22 mins                         Doreatha Massed, PT Acute Rehabilitation  Office: [phone] Pager: [phone]

## 2020-07-07 ENCOUNTER — Inpatient Hospital Stay (HOSPITAL_COMMUNITY): Payer: Medicare PPO

## 2020-07-07 DIAGNOSIS — D693 Immune thrombocytopenic purpura: Secondary | ICD-10-CM | POA: Diagnosis not present

## 2020-07-07 DIAGNOSIS — J9621 Acute and chronic respiratory failure with hypoxia: Secondary | ICD-10-CM | POA: Diagnosis not present

## 2020-07-07 DIAGNOSIS — K2100 Gastro-esophageal reflux disease with esophagitis, without bleeding: Secondary | ICD-10-CM | POA: Diagnosis not present

## 2020-07-07 DIAGNOSIS — U071 COVID-19: Secondary | ICD-10-CM | POA: Diagnosis not present

## 2020-07-07 LAB — COMPREHENSIVE METABOLIC PANEL
ALT: 19 U/L (ref 0–44)
AST: 16 U/L (ref 15–41)
Albumin: 3.5 g/dL (ref 3.5–5.0)
Alkaline Phosphatase: 62 U/L (ref 38–126)
Anion gap: 14 (ref 5–15)
BUN: 34 mg/dL — ABNORMAL HIGH (ref 8–23)
CO2: 40 mmol/L — ABNORMAL HIGH (ref 22–32)
Calcium: 9 mg/dL (ref 8.9–10.3)
Chloride: 83 mmol/L — ABNORMAL LOW (ref 98–111)
Creatinine, Ser: 0.53 mg/dL (ref 0.44–1.00)
GFR, Estimated: >60 mL/min (ref >60)
Glucose, Bld: 143 mg/dL — ABNORMAL HIGH (ref 70–99)
Potassium: 4 mmol/L (ref 3.5–5.1)
Sodium: 137 mmol/L (ref 135–145)
Total Bilirubin: 1.9 mg/dL — ABNORMAL HIGH (ref 0.3–1.2)
Total Protein: 6 g/dL — ABNORMAL LOW (ref 6.5–8.1)

## 2020-07-07 LAB — FERRITIN: Ferritin: 216 ng/mL (ref 11–307)

## 2020-07-07 LAB — C-REACTIVE PROTEIN: CRP: 0.7 mg/dL (ref <1.0)

## 2020-07-07 LAB — D-DIMER, QUANTITATIVE: D-Dimer, Quant: 0.3 ug/mL-FEU (ref 0.00–0.50)

## 2020-07-07 MED ORDER — FUROSEMIDE 10 MG/ML IJ SOLN
80.0000 mg | Freq: Two times a day (BID) | INTRAMUSCULAR | Status: DC
  Administered 2020-07-07 – 2020-07-10 (×6): 80 mg via INTRAVENOUS
  Filled 2020-07-07 (×7): qty 8

## 2020-07-07 NOTE — Progress Notes (Addendum)
[name], Evelyn DOA: Maldonado/15/2021 PCP: [name], Evelyn    Brief Narrative:  Mrs. Boernerwas admitted with a working diagnosis of acute hypoxic/hypercapnic respiratory failure due to SARS COVID-19 viral pneumonia. Prolonged hospitalization complicated with pulmonary edema and bilateral pleural effusions.  69 year old female was Maldonado to the hospital due to oxygen saturation down to the 80s, she was unable to give any detailed history due to metabolic encephalopathy.On her initial physical examination blood pressure 124/52, heart rate 116, temperature 99.4, respiratory rate 22, oxygen saturation 100% supplemental oxygen per noninvasive mechanical ventilation. Heart S1-S2, present rhythmic, his lungs had bilateral wheezing, abdomen protuberant, soft nontender, positive bilateral lower extremity edema.  Chest radiograph withrightlower lobe opacity withsmall right pleural effusion, left lower lobe interstitial infiltrate.  Patient has been medically treated with remdesivir, and systemic corticosteroids. She received IV furosemide for diuresis with good toleration, non cardiogenic pulmonary edema.   She has been very weak and deconditioned, will need SNF for discharge.Patient has declined SNF placement. At home uses 3 L/min per [place] of supplemental 02.   Plan for possible dc home in am with home health, will need higher oxygen flow, likely 5 L per minute.    Assessment & Plan:   Principal Problem:   Pneumonia due to COVID-19 virus Active Problems:   HTN (hypertension)   Chronic idiopathic thrombocytopenia (HCC)   GERD (gastroesophageal reflux disease)   Obesity hypoventilation syndrome (HCC)   Acute on chronic respiratory failure with hypoxia and hypercapnia (HCC)   1.Acute on chronic hypoxic/hypercapnic respiratory failure due to SARS COVID-19 viral pneumonia/ bilateral pleural effusion .Sp remdesivir #5/5  RR:  20  Pulse oxymetry: 91%  Fi02: 5 L/min [place]   COVID-19 Labs  Recent Labs    Maldonado/21/21 0414 Maldonado/22/21 0410 Maldonado/23/21 0406  DDIMER <0.27 <0.27 0.30  FERRITIN 141 144 216  CRP <0.5 0.6 0.7    Lab Results  Component Value Date   SARSCOV2NAA POSITIVE (A) Maldonado/15/2021   [place] NEGATIVE 05/14/2020   [place] NEGATIVE 04/29/2020   [place] NEGATIVE 04/18/2020    Follow up chest film this am with hilar congestion and bilateral pleural effusion, more right than left.  If no response to diuresis will consult IR for possible US guided thoracentesis on right side.   Continue with systemic steroids with dexamethasone 6 mg daily, bronchodilator and antitussiveagents. Encourage out of bed to chair. She has poor mobility.   2, Acute on chronic diastolic heart failure. This am with signs of volume overload, will resume IV diuresis with furosemide.  Old records personally reviewed, with EF more 75% on LV, preserved RV systolic function.   3. HTN.Continue blood pressure control with lisinopril and carvedilol.  4. Hypokalemia/ hypophosphatemia and hypomagnesemia/ metabolic alkalosis.Stable renal function and electrolytes.  Continue close follow up on renal function and electrolytes.   5. Obesity class 3/ OSA.  BMI is 40.0/ Follow as outpatient.   6. Chronic lymphedema lower extremities and ambulatory dysfunction.Local wound care and compression bandages.  7. Stage 2 pressure ulcer at the nose. Present on admission.Local skin care,    Patient continue to be at high risk for worsening respiratory failure.   Status is: Inpatient  Remains inpatient appropriate because:IV treatments appropriate due to intensity of illness or inability to take PO   Dispo: The patient is from: Home              Anticipated d/c is to: Home  Anticipated d/c date is: 2 days              Patient currently is not medically stable to d/c.   DVT prophylaxis: Enoxaparin   Code  Status:   full  Family Communication:  I spoke over the phone with the patient's daughter about patient's  condition, plan of care, prognosis and all questions were addressed.    Nutrition Status:           Skin Documentation: Pressure Injury 04/22/20 Sacrum Medial Stage 2 -  Partial thickness loss of dermis presenting as a shallow open injury with a red, pink wound bed without slough. blanching redness with small open area (Active)  04/22/20 1553  Location: Sacrum  Location Orientation: Medial  Staging: Stage 2 -  Partial thickness loss of dermis presenting as a shallow open injury with a red, pink wound bed without slough.  Wound Description (Comments): blanching redness with small open area  Present on Admission:      Pressure Injury 04/22/20 Thigh Posterior;Proximal;Right Stage 2 -  Partial thickness loss of dermis presenting as a shallow open injury with a red, pink wound bed without slough. small open area (Active)  04/22/20 1555  Location: Thigh  Location Orientation: Posterior;Proximal;Right  Staging: Stage 2 -  Partial thickness loss of dermis presenting as a shallow open injury with a red, pink wound bed without slough.  Wound Description (Comments): small open area  Present on Admission:      Pressure Injury 05/14/20 Buttocks Left;Medial Stage 2 -  Partial thickness loss of dermis presenting as a shallow open injury with a red, pink wound bed without slough. (Active)  05/14/20 0510  Location: Buttocks  Location Orientation: Left;Medial  Staging: Stage 2 -  Partial thickness loss of dermis presenting as a shallow open injury with a red, pink wound bed without slough.  Wound Description (Comments):   Present on Admission: Yes     Pressure Injury Maldonado/16/21 Nose Medial Stage 2 -  Partial thickness loss of dermis presenting as a shallow open injury with a red, pink wound bed without slough. (Active)  Maldonado/16/21 1230  Location: Nose  Location Orientation: Medial  Staging:  Stage 2 -  Partial thickness loss of dermis presenting as a shallow open injury with a red, pink wound bed without slough.  Wound Description (Comments):   Present on Admission: Yes    Subjective: Patient continue to have dyspnea with minimal efforts, no nausea or vomiting, no chest pain or dyspnea.   Objective: Vitals:   Maldonado/22/21 1327 Maldonado/22/21 2025 Maldonado/23/21 0500 Maldonado/23/21 0530  BP: 112/63 109/77  111/69  Pulse: 96 98  91  Resp: 19 (!) 24  20  Temp: 98.3 F (36.8 C) 98.3 F (36.8 C)  98.3 F (36.8 C)  TempSrc: Oral Oral  Oral  SpO2: (!) 86% (!) 81%  91%  Weight:   84.8 kg   Height:        Intake/Output Summary (Last 24 hours) at Maldonado/23/2021 0930 Last data filed at Maldonado/23/2021 0820 Gross per 24 hour  Intake 480 ml  Output --  Net 480 ml   Filed Weights   Maldonado/21/21 0500 Maldonado/22/21 0500 Maldonado/23/21 0500  Weight: 87.1 kg 81.6 kg 84.8 kg    Examination:   General: deconditioned  Neurology: Awake and alert, non focal  E ENT: no pallor, no icterus, oral mucosa moist Cardiovascular: No JVD. S1-S2 present, rhythmic, no gallops, rubs, or murmurs. No lower extremity edema. Pulmonary: positive breath sounds  bilaterally, decreased air movement, bilateral rales and decreased breath sounds at bases.  Gastrointestinal. Abdomen soft and non tender Skin. No rashes Musculoskeletal: no joint deformities     Data Reviewed: I have personally reviewed following labs and imaging studies  CBC: Recent Labs  Lab Maldonado/16/21 0952 Maldonado/17/21 0823 Maldonado/18/21 0653 Maldonado/19/21 0732 Maldonado/20/21 0248  WBC  --  3.9* 3.0* 9.0 7.0  NEUTROABS  --   --   --  7.3 5.6  HGB Maldonado.5* 11.2* Maldonado.6* 11.8* 11.7*  HCT 31.0* 36.7 35.0* 38.7 38.7  MCV  --  97.9 96.2 95.8 95.8  PLT  --  59* 51* 68* 57*   Basic Metabolic Panel: Recent Labs  Lab Maldonado/18/21 0653 Maldonado/18/21 0653 Maldonado/18/21 1642 Maldonado/19/21 0732 Maldonado/20/21 0248 Maldonado/21/21 0414 Maldonado/22/21 0410 Maldonado/23/21 0406  NA 142   < >  --  143 142 142 140 137  K 2.9*   < > 3.6 3.7  3.7 3.7 3.9 4.0  CL 91*   < >  --  86* 81* 82* 86* 83*  CO2 42*   < >  --  49* 50* 47* 41* 40*  GLUCOSE 138*   < >  --  109* 130* 120* 138* 143*  BUN 24*   < >  --  17 13 16  26* 34*  CREATININE 0.50   < >  --  0.38* 0.33* 0.40* 0.44 0.53  CALCIUM 8.1*   < >  --  8.1* 7.9* 8.7* 8.6* 9.0  MG 2.0  --  1.7 1.9 2.5*  --   --   --   PHOS 1.4*  --  3.6 2.6 2.0*  --   --   --    < > = values in this interval not displayed.   GFR: Estimated Creatinine Clearance: 65.9 mL/min (by C-G formula based on SCr of 0.53 mg/dL). Liver Function Tests: Recent Labs  Lab Maldonado/19/21 0732 Maldonado/20/21 0248 Maldonado/21/21 0414 Maldonado/22/21 0410 Maldonado/23/21 0406  AST 16 14* 16 13* 16  ALT 16 16 17 15 19   ALKPHOS 60 56 55 54 62  BILITOT 1.3* 1.9* 2.3* 2.0* 1.9*  PROT 5.5* 5.3* 5.8* 5.6* 6.0*  ALBUMIN 3.2* 3.3* 3.5 3.4* 3.5   No results for input(s): LIPASE, AMYLASE in the last 168 hours. No results for input(s): AMMONIA in the last 168 hours. Coagulation Profile: No results for input(s): INR, PROTIME in the last 168 hours. Cardiac Enzymes: No results for input(s): CKTOTAL, CKMB, CKMBINDEX, TROPONINI in the last 168 hours. BNP (last 3 results) No results for input(s): PROBNP in the last 8760 hours. HbA1C: No results for input(s): HGBA1C in the last 72 hours. CBG: No results for input(s): GLUCAP in the last 168 hours. Lipid Profile: No results for input(s): CHOL, HDL, LDLCALC, TRIG, CHOLHDL, LDLDIRECT in the last 72 hours. Thyroid Function Tests: No results for input(s): TSH, T4TOTAL, FREET4, T3FREE, THYROIDAB in the last 72 hours. Anemia Panel: Recent Labs    Maldonado/22/21 0410 Maldonado/23/21 0406  FERRITIN 144 79      Radiology Studies: I have reviewed all of the imaging during this hospital visit personally     Scheduled Meds: . vitamin C  500 mg Oral Daily  . carvedilol  6.25 mg Oral BID WC  . chlorhexidine  15 mL Mouth Rinse BID  . Chlorhexidine Gluconate Cloth  6 each Topical Daily  . dexamethasone  6 mg  Oral Q breakfast  . enoxaparin (LOVENOX) injection  40 mg Subcutaneous Q24H  . furosemide  80  mg Oral Daily  . influenza vaccine adjuvanted  0.5 mL Intramuscular Tomorrow-1000  . Ipratropium-Albuterol  2 puff Inhalation Q6H  . lisinopril  2.5 mg Oral Daily  . mouth rinse  15 mL Mouth Rinse q12n4p  . nystatin   Topical BID  . pneumococcal 23 valent vaccine  0.5 mL Intramuscular Tomorrow-1000  . zinc sulfate  220 mg Oral Daily   Continuous Infusions: . sodium chloride Stopped (Maldonado/18/21 1117)     LOS: 8 days        Ashla Murph Gerome Apley, Evelyn

## 2020-07-07 NOTE — Progress Notes (Signed)
Pt refused to have wound care to BLE today. Stating that she only gets her wound care done twice a week at home by [facility] and her wounds were done yesterday here at the hospital. Encouraged her to have wound care performed as ordered by the expert assessment of the [place] team.

## 2020-07-08 DIAGNOSIS — J9621 Acute and chronic respiratory failure with hypoxia: Secondary | ICD-10-CM | POA: Diagnosis not present

## 2020-07-08 DIAGNOSIS — D693 Immune thrombocytopenic purpura: Secondary | ICD-10-CM | POA: Diagnosis not present

## 2020-07-08 DIAGNOSIS — K2100 Gastro-esophageal reflux disease with esophagitis, without bleeding: Secondary | ICD-10-CM | POA: Diagnosis not present

## 2020-07-08 DIAGNOSIS — U071 COVID-19: Secondary | ICD-10-CM | POA: Diagnosis not present

## 2020-07-08 LAB — COMPREHENSIVE METABOLIC PANEL
ALT: 21 U/L (ref 0–44)
AST: 18 U/L (ref 15–41)
Albumin: 3.4 g/dL — ABNORMAL LOW (ref 3.5–5.0)
Alkaline Phosphatase: 60 U/L (ref 38–126)
Anion gap: 12 (ref 5–15)
BUN: 36 mg/dL — ABNORMAL HIGH (ref 8–23)
CO2: 43 mmol/L — ABNORMAL HIGH (ref 22–32)
Calcium: 8.3 mg/dL — ABNORMAL LOW (ref 8.9–10.3)
Chloride: 82 mmol/L — ABNORMAL LOW (ref 98–111)
Creatinine, Ser: 0.47 mg/dL (ref 0.44–1.00)
GFR, Estimated: >60 mL/min (ref >60)
Glucose, Bld: 138 mg/dL — ABNORMAL HIGH (ref 70–99)
Potassium: 3.7 mmol/L (ref 3.5–5.1)
Sodium: 137 mmol/L (ref 135–145)
Total Bilirubin: 1.8 mg/dL — ABNORMAL HIGH (ref 0.3–1.2)
Total Protein: 5.5 g/dL — ABNORMAL LOW (ref 6.5–8.1)

## 2020-07-08 LAB — D-DIMER, QUANTITATIVE: D-Dimer, Quant: 0.39 ug/mL-FEU (ref 0.00–0.50)

## 2020-07-08 LAB — FERRITIN: Ferritin: 224 ng/mL (ref 11–307)

## 2020-07-08 LAB — C-REACTIVE PROTEIN: CRP: 0.6 mg/dL (ref <1.0)

## 2020-07-08 MED ORDER — POTASSIUM CHLORIDE CRYS ER 20 MEQ PO TBCR
40.0000 meq | EXTENDED_RELEASE_TABLET | Freq: Once | ORAL | Status: AC
  Administered 2020-07-08: 40 meq via ORAL
  Filled 2020-07-08: qty 2

## 2020-07-08 MED ORDER — DIPHENHYDRAMINE HCL 25 MG PO CAPS
25.0000 mg | ORAL_CAPSULE | Freq: Once | ORAL | Status: AC
  Administered 2020-07-08: 25 mg via ORAL
  Filled 2020-07-08: qty 1

## 2020-07-08 NOTE — Progress Notes (Signed)
MD--pt's son has asked for an update tomorrow after IR procedure. Could you call his wife (pt's daughter-in-law), Shalane Florendo, at [phone], at your convenience.

## 2020-07-08 NOTE — Progress Notes (Signed)
This shift encouraged use of IS at the bedside, reinforcement needed , max 400 after several attempts. CPAP and wound dressing change refused by pt.

## 2020-07-08 NOTE — Progress Notes (Signed)
Patient refuses CPAP for tonight.  

## 2020-07-08 NOTE — Progress Notes (Signed)
Pt requesting BSC for home.

## 2020-07-08 NOTE — Progress Notes (Signed)
Pt refused wound care today.

## 2020-07-08 NOTE — Progress Notes (Addendum)
PROGRESS NOTE    [name]  XJO:[id number] DOB: 1951-07-07 DOA: 06/29/2020 PCP: [name], MD    Brief Narrative:  Mrs. Boernerwas admitted with a working diagnosis of acute hypoxic/hypercapnic respiratory failure due to SARS COVID-19 viral pneumonia. Prolonged hospitalization complicated with pulmonary edema and bilateral pleural effusions.  69 year old female was brought to the hospital due to oxygen saturation down to the 80s, she was unable to give any detailed history due to metabolic encephalopathy.On her initial physical examination blood pressure 124/52, heart rate 116, temperature 99.4, respiratory rate 22, oxygen saturation 100% supplemental oxygen per noninvasive mechanical ventilation. Heart S1-S2, present rhythmic, his lungs had bilateral wheezing, abdomen protuberant, soft nontender, positive bilateral lower extremity edema.  Chest radiograph withrightlower lobe opacity withsmall right pleural effusion, left lower lobe interstitial infiltrate.  Patient has been medically treated with remdesivir, and systemic corticosteroids. She received IV furosemide for diuresis with good toleration, cardiogenic pulmonary edema.  She has been very weak and deconditioned, will need SNF for discharge.Patient has declined SNF placement. At home uses 3 L/min per [place] of supplemental 02.  Follow chest film with persistent and worsening bilateral pleural effusions related to diastolic heart failure. Now on diuresis and will plan for right thoracentesis.    Assessment & Plan:   Principal Problem:   Pneumonia due to COVID-19 virus Active Problems:   HTN (hypertension)   Chronic idiopathic thrombocytopenia (HCC)   GERD (gastroesophageal reflux disease)   Obesity hypoventilation syndrome (HCC)   Acute on chronic respiratory failure with hypoxia and hypercapnia (HCC)   1.Acute on chronic hypoxic/hypercapnic respiratory failure due to SARS COVID-19 viral  pneumonia/ bilateral pleural effusion .Sp remdesivir #5/5  RR: 20  Pulse oxymetry: 86 to 93%  Fi02: 8 L/ min per [facility]  COVID-19 Labs  Recent Labs    07/06/20 0410 07/07/20 0406 07/08/20 0336  DDIMER <0.27 0.30 0.39  FERRITIN 144 216 224  CRP 0.6 0.7 0.6    Lab Results  Component Value Date   SARSCOV2NAA POSITIVE (A) 06/29/2020   [place] NEGATIVE 05/14/2020   [place] NEGATIVE 04/29/2020   [place] NEGATIVE 04/18/2020     Continue with systemic steroids withdexamethasone 6 mg daily for 2 more days, until 10/26. On bronchodilator and antitussiveagents. Continue to encourage out of bed to chair. She has poor mobility. Declines SNF, plan to return home when improves oxygenation.   2, Acute on chronic diastolic heart failure/ acute cardiogenic pulmonary edema/ bilateral pleural effusions. EF more 75% on LV, preserved RV systolic function.  Urine output for the last 24 H is 1,300 cc, she has chronic lower extremity edema with component of lymphedema.   Continue with aggressive diuresis. Plan for US guided thoracentesis on the right in am.  Check serum LDH in am.  3. HTN.systolic blood pressure 415 to 105 mmHg, will continue with carvedilol but will discontinue lisinopril.   4. Hypokalemia/ hypophosphatemia and hypomagnesemia/ metabolic alkalosis. Renal function with serum cr at 0,47, K at 3,7 and bicarbonate at 43.  Continue diuresis with furosemide, Kcl 40 meq today and follow up on renal function in am.    5. Obesity class 3/ OSA.  BMI is 40.0/ patient with poor mobility, she has declined SNF for rehab.  6. Chronic lymphedema lower extremities and ambulatory dysfunction.Continue with wound care and compression bandages.  7. Stage 2 pressure ulcer at the nose. Continue local skin care,    Patient continue to be at high risk for worsening respiratory failure   Status is: Inpatient  Remains inpatient appropriate because:IV treatments appropriate  due to intensity of illness or inability to take PO   Dispo: The patient is from: Home              Anticipated d/c is to: Home              Anticipated d/c date is: 2 days              Patient currently is not medically stable to d/c.    DVT prophylaxis: Enoxaparin   Code Status:   full  Family Communication:  I spoke over the phone with the patient's daughter about patient's  condition, plan of care, prognosis and all questions were addressed.    Nutrition Status:           Skin Documentation: Pressure Injury 04/22/20 Sacrum Medial Stage 2 -  Partial thickness loss of dermis presenting as a shallow open injury with a red, pink wound bed without slough. blanching redness with small open area (Active)  04/22/20 1553  Location: Sacrum  Location Orientation: Medial  Staging: Stage 2 -  Partial thickness loss of dermis presenting as a shallow open injury with a red, pink wound bed without slough.  Wound Description (Comments): blanching redness with small open area  Present on Admission:      Pressure Injury 04/22/20 Thigh Posterior;Proximal;Right Stage 2 -  Partial thickness loss of dermis presenting as a shallow open injury with a red, pink wound bed without slough. small open area (Active)  04/22/20 1555  Location: Thigh  Location Orientation: Posterior;Proximal;Right  Staging: Stage 2 -  Partial thickness loss of dermis presenting as a shallow open injury with a red, pink wound bed without slough.  Wound Description (Comments): small open area  Present on Admission:      Pressure Injury 05/14/20 Buttocks Left;Medial Stage 2 -  Partial thickness loss of dermis presenting as a shallow open injury with a red, pink wound bed without slough. (Active)  05/14/20 0510  Location: Buttocks  Location Orientation: Left;Medial  Staging: Stage 2 -  Partial thickness loss of dermis presenting as a shallow open injury with a red, pink wound bed without slough.  Wound Description  (Comments):   Present on Admission: Yes     Pressure Injury 06/30/20 Nose Medial Stage 2 -  Partial thickness loss of dermis presenting as a shallow open injury with a red, pink wound bed without slough. (Active)  06/30/20 1230  Location: Nose  Location Orientation: Medial  Staging: Stage 2 -  Partial thickness loss of dermis presenting as a shallow open injury with a red, pink wound bed without slough.  Wound Description (Comments):   Present on Admission: Yes      Subjective: Patient is very weak and deconditioned, continue to have dyspnea and increased oxygen requirements. No nausea or vomiting.   Objective: Vitals:   07/07/20 2000 07/07/20 2109 07/08/20 0421 07/08/20 0500  BP:  (!) 105/59 (!) 104/55   Pulse:  87 73   Resp:  20 20   Temp:  98.4 F (36.9 C) 98.3 F (36.8 C)   TempSrc:  Oral Oral   SpO2: 91% (!) 86% 93%   Weight:    83.9 kg  Height:        Intake/Output Summary (Last 24 hours) at 07/08/2020 0950 Last data filed at 07/08/2020 0437 Gross per 24 hour  Intake 240 ml  Output 1300 ml  Net -1060 ml   Filed Weights   07/06/20  0500 07/07/20 0500 07/08/20 0500  Weight: 81.6 kg 84.8 kg 83.9 kg    Examination:   General: Not in pain or dyspnea. Deconditioned  Neurology: Awake and alert, non focal  E ENT: mild pallor, no icterus, oral mucosa dry.  Cardiovascular: No JVD. S1-S2 present, rhythmic, no gallops, rubs, or murmurs. No lower extremity edema. Pulmonary: positive breath sounds bilaterally, decreased ventilation at basea bilaterally Gastrointestinal. Abdomen soft and non tender Skin. No rashes Musculoskeletal: no joint deformities     Data Reviewed: I have personally reviewed following labs and imaging studies  CBC: Recent Labs  Lab 07/02/20 0653 07/03/20 0732 07/04/20 0248  WBC 3.0* 9.0 7.0  NEUTROABS  --  7.3 5.6  HGB 10.6* 11.8* 11.7*  HCT 35.0* 38.7 38.7  MCV 96.2 95.8 95.8  PLT 51* 68* 57*   Basic Metabolic Panel: Recent Labs   Lab 07/02/20 0653 07/02/20 0653 07/02/20 1642 07/03/20 0732 07/03/20 0732 07/04/20 0248 07/05/20 0414 07/06/20 0410 07/07/20 0406 07/08/20 0336  NA 142   < >  --  143   < > 142 142 140 137 137  K 2.9*   < > 3.6 3.7   < > 3.7 3.7 3.9 4.0 3.7  CL 91*   < >  --  86*   < > 81* 82* 86* 83* 82*  CO2 42*   < >  --  49*   < > 50* 47* 41* 40* 43*  GLUCOSE 138*   < >  --  109*   < > 130* 120* 138* 143* 138*  BUN 24*   < >  --  17   < > 13 16 26* 34* 36*  CREATININE 0.50   < >  --  0.38*   < > 0.33* 0.40* 0.44 0.53 0.47  CALCIUM 8.1*   < >  --  8.1*   < > 7.9* 8.7* 8.6* 9.0 8.3*  MG 2.0  --  1.7 1.9  --  2.5*  --   --   --   --   PHOS 1.4*  --  3.6 2.6  --  2.0*  --   --   --   --    < > = values in this interval not displayed.   GFR: Estimated Creatinine Clearance: 65.6 mL/min (by C-G formula based on SCr of 0.47 mg/dL). Liver Function Tests: Recent Labs  Lab 07/04/20 0248 07/05/20 0414 07/06/20 0410 07/07/20 0406 07/08/20 0336  AST 14* 16 13* 16 18  ALT 16 17 15 19 21   ALKPHOS 56 55 54 62 60  BILITOT 1.9* 2.3* 2.0* 1.9* 1.8*  PROT 5.3* 5.8* 5.6* 6.0* 5.5*  ALBUMIN 3.3* 3.5 3.4* 3.5 3.4*   No results for input(s): LIPASE, AMYLASE in the last 168 hours. No results for input(s): AMMONIA in the last 168 hours. Coagulation Profile: No results for input(s): INR, PROTIME in the last 168 hours. Cardiac Enzymes: No results for input(s): CKTOTAL, CKMB, CKMBINDEX, TROPONINI in the last 168 hours. BNP (last 3 results) No results for input(s): PROBNP in the last 8760 hours. HbA1C: No results for input(s): HGBA1C in the last 72 hours. CBG: No results for input(s): GLUCAP in the last 168 hours. Lipid Profile: No results for input(s): CHOL, HDL, LDLCALC, TRIG, CHOLHDL, LDLDIRECT in the last 72 hours. Thyroid Function Tests: No results for input(s): TSH, T4TOTAL, FREET4, T3FREE, THYROIDAB in the last 72 hours. Anemia Panel: Recent Labs    07/07/20 0406 07/08/20 0336  FERRITIN 216  224      Radiology Studies: I have reviewed all of the imaging during this hospital visit personally     Scheduled Meds: . vitamin C  500 mg Oral Daily  . carvedilol  6.25 mg Oral BID WC  . chlorhexidine  15 mL Mouth Rinse BID  . Chlorhexidine Gluconate Cloth  6 each Topical Daily  . dexamethasone  6 mg Oral Q breakfast  . enoxaparin (LOVENOX) injection  40 mg Subcutaneous Q24H  . furosemide  80 mg Intravenous Q12H  . influenza vaccine adjuvanted  0.5 mL Intramuscular Tomorrow-1000  . Ipratropium-Albuterol  2 puff Inhalation Q6H  . lisinopril  2.5 mg Oral Daily  . mouth rinse  15 mL Mouth Rinse q12n4p  . nystatin   Topical BID  . pneumococcal 23 valent vaccine  0.5 mL Intramuscular Tomorrow-1000  . zinc sulfate  220 mg Oral Daily   Continuous Infusions:   LOS: 9 days        Telicia Hodgkiss Gerome Apley, MD

## 2020-07-09 ENCOUNTER — Inpatient Hospital Stay (HOSPITAL_COMMUNITY): Payer: Medicare PPO

## 2020-07-09 DIAGNOSIS — D693 Immune thrombocytopenic purpura: Secondary | ICD-10-CM | POA: Diagnosis not present

## 2020-07-09 DIAGNOSIS — I159 Secondary hypertension, unspecified: Secondary | ICD-10-CM

## 2020-07-09 DIAGNOSIS — J9621 Acute and chronic respiratory failure with hypoxia: Secondary | ICD-10-CM | POA: Diagnosis not present

## 2020-07-09 DIAGNOSIS — U071 COVID-19: Secondary | ICD-10-CM | POA: Diagnosis not present

## 2020-07-09 DIAGNOSIS — K2100 Gastro-esophageal reflux disease with esophagitis, without bleeding: Secondary | ICD-10-CM | POA: Diagnosis not present

## 2020-07-09 LAB — BASIC METABOLIC PANEL
Anion gap: 10 (ref 5–15)
BUN: 35 mg/dL — ABNORMAL HIGH (ref 8–23)
CO2: 43 mmol/L — ABNORMAL HIGH (ref 22–32)
Calcium: 8.6 mg/dL — ABNORMAL LOW (ref 8.9–10.3)
Chloride: 84 mmol/L — ABNORMAL LOW (ref 98–111)
Creatinine, Ser: 0.54 mg/dL (ref 0.44–1.00)
GFR, Estimated: >60 mL/min (ref >60)
Glucose, Bld: 193 mg/dL — ABNORMAL HIGH (ref 70–99)
Potassium: 4 mmol/L (ref 3.5–5.1)
Sodium: 137 mmol/L (ref 135–145)

## 2020-07-09 LAB — LACTATE DEHYDROGENASE: LDH: 129 U/L (ref 98–192)

## 2020-07-09 LAB — MAGNESIUM: Magnesium: 2 mg/dL (ref 1.7–2.4)

## 2020-07-09 MED ORDER — SIMETHICONE 80 MG PO CHEW
80.0000 mg | CHEWABLE_TABLET | Freq: Four times a day (QID) | ORAL | Status: DC | PRN
  Filled 2020-07-09: qty 1

## 2020-07-09 MED ORDER — LIDOCAINE HCL 1 % IJ SOLN
INTRAMUSCULAR | Status: AC
  Filled 2020-07-09: qty 10

## 2020-07-09 NOTE — TOC Progression Note (Addendum)
Transition of Care [facility]) - Progression Note    Patient Details  Name: [name] MRN: [id number] Date of Birth: [birth year]  Transition of Care [facility]) CM/SW [place], [place] Phone Number: 07/09/2020, 11:04 AM  Clinical Narrative:   Followed up with patient in response to PT recommendation of SNF/24 hour supervision.  "It's already established that I will be going home."  [name] made it clear that she had a bad experience in rehab in the past, and she has no intention of recreating that experience.  States she lives with daughter and son-in-law, that daughter works from home and is available to help her when help is needed.  Has O2, walker and [facility] agency. She admitted that she is not able to use her tub for bathing; is dependent on [facility] [name] Ms aide for sponge baths and washing hair a couple times a week.  She gave permission to call her daughter, [name], who confirmed that they have all needed equipment, [name] is a bedside commode tucked away that she will get out] that she does work from home and is able to help her mother, and that the patient is fiercely independent who is often reluctant to ask for help. Patient and daughter request ambulance transportation home as she is on constant O2 and only vehicle they have is large SUV that requires step up into vehicle. TOC will continue to follow during the course of hospitalization.     Expected Discharge Plan: [place] Barriers to Discharge: Barriers Resolved  Expected Discharge Plan and Services Expected Discharge Plan: [place]   Discharge Planning Services: CM Consult   Living arrangements for the past 2 months: Single Family Home                                       Social Determinants of Health (SDOH) Interventions    Readmission Risk Interventions No flowsheet data found.

## 2020-07-09 NOTE — Progress Notes (Signed)
Pt. refused BiPAP placement this evening, remains at bedside if needed, currently on 5lpm n/c.

## 2020-07-09 NOTE — Care Management Important Message (Signed)
Important Message  Patient Details IM Letter placed in door cubby to be presented to the Patient Name: [name] MRN: [id number] Date of Birth: 1951/06/11   Medicare Important Message Given:  Yes     Kerin Salen 07/09/2020, 11:52 AM

## 2020-07-09 NOTE — Progress Notes (Signed)
PROGRESS NOTE    [name]  GLO:[id number] DOB: 1951-08-23 DOA: 06/29/2020 PCP: [name], MD    Brief Narrative:  Mrs. Boernerwas admitted with a working diagnosis of acute hypoxic/hypercapnic respiratory failure due to SARS COVID-19 viral pneumonia.Prolonged hospitalization complicated with pulmonary edema and bilateral pleural effusions.  69 year old female was brought to the hospital due to oxygen saturation down to the 80s, she was unable to give any detailed history due to metabolic encephalopathy.On her initial physical examination blood pressure 124/52, heart rate 116, temperature 99.4, respiratory rate 22, oxygen saturation 100% supplemental oxygen per noninvasive mechanical ventilation. Heart S1-S2, present rhythmic, his lungs had bilateral wheezing, abdomen protuberant, soft nontender, positive bilateral lower extremity edema.  Chest radiograph withrightlower lobe opacity withsmall right pleural effusion, left lower lobe interstitial infiltrate.  Patient has been medically treated with remdesivir, and systemic corticosteroids. She received IV furosemide for diuresis with good toleration, cardiogenic pulmonary edema.  She has been very weak and deconditioned, will need SNF for discharge.Patient has declined SNF placement. At home uses 3 L/min per [place] of supplemental 02.  Follow chest film with persistent and worsening bilateral pleural effusions related to diastolic heart failure. Now on diuresis and will plan for right thoracentesis.    Assessment & Plan:   Principal Problem:   Pneumonia due to COVID-19 virus Active Problems:   HTN (hypertension)   Chronic idiopathic thrombocytopenia (HCC)   GERD (gastroesophageal reflux disease)   Obesity hypoventilation syndrome (HCC)   Acute on chronic respiratory failure with hypoxia and hypercapnia (HCC)   1.Acute on chronic hypoxic/hypercapnic respiratory failure due to SARS COVID-19 viral  pneumonia/ bilateral pleural effusion.Sp remdesivir #5/5.   RR: 18  Pulse oxymetry: 93%  Fi02: 5 L/min per HFNC  Dexamethasone regimen completed today. Continue with bronchodilator and antitussiveagents. Encourage out of bed to chair.  Patient continue to decline SNF, will arrange to continue with home health services at discharge.   2, Acute on chronic diastolic heart failure/ acute cardiogenic pulmonary edema/ bilateral pleural effusions. EF more 75% on LV, preserved RV systolic function. Urine output for the last 24 H is 1,550 cc, improvement in lower extremity edema.  Continue with furosemide IV to target further negative fluids balance. Plan for thoracentesis today, suspected transudate due to diastolic heart failure exacerbation.  Follow on pleural fluid analysis.   3. HTN.blood pressure today 115/61 mmHg. Continue with carvedilol, holding on lisinopril.   4. Hypokalemia/ hypophosphatemia and hypomagnesemia/ metabolic alkalosis. Stable renal function, with cr at 0,54, K is 4,0 and bicarbonate at 43. Continue with furosemide with good toleration, patient diuresing well.  Follow up on renal function and electrolytes in am.    5. Obesity class 3/ OSA. BMI is 40.0/ cpap at night.   6. Chronic lymphedema lower extremities and ambulatory dysfunction.Patient with chronic wounds, not able to assess, dressing in place, will continue with wound care andcompression bandages. At home gets wound care twice per week.   7. Stage 2 pressure ulcer at the nose. Dressing in place.    Status is: Inpatient  Remains inpatient appropriate because:IV treatments appropriate due to intensity of illness or inability to take PO   Dispo: The patient is from: Home              Anticipated d/c is to: Home              Anticipated d/c date is: 1 day              Patient currently  is not medically stable to d/c.   DVT prophylaxis: Enoxaparin   Code Status:   full  Family  Communication:  I spoke over the phone with the patient's daughter about patient's  condition, plan of care, prognosis and all questions were addressed.    Nutrition Status:   Skin Documentation: Pressure Injury 04/22/20 Sacrum Medial Stage 2 -  Partial thickness loss of dermis presenting as a shallow open injury with a red, pink wound bed without slough. blanching redness with small open area (Active)  04/22/20 1553  Location: Sacrum  Location Orientation: Medial  Staging: Stage 2 -  Partial thickness loss of dermis presenting as a shallow open injury with a red, pink wound bed without slough.  Wound Description (Comments): blanching redness with small open area  Present on Admission:      Pressure Injury 04/22/20 Thigh Posterior;Proximal;Right Stage 2 -  Partial thickness loss of dermis presenting as a shallow open injury with a red, pink wound bed without slough. small open area (Active)  04/22/20 1555  Location: Thigh  Location Orientation: Posterior;Proximal;Right  Staging: Stage 2 -  Partial thickness loss of dermis presenting as a shallow open injury with a red, pink wound bed without slough.  Wound Description (Comments): small open area  Present on Admission:      Pressure Injury 05/14/20 Buttocks Left;Medial Stage 2 -  Partial thickness loss of dermis presenting as a shallow open injury with a red, pink wound bed without slough. (Active)  05/14/20 0510  Location: Buttocks  Location Orientation: Left;Medial  Staging: Stage 2 -  Partial thickness loss of dermis presenting as a shallow open injury with a red, pink wound bed without slough.  Wound Description (Comments):   Present on Admission: Yes     Pressure Injury 06/30/20 Nose Medial Stage 2 -  Partial thickness loss of dermis presenting as a shallow open injury with a red, pink wound bed without slough. (Active)  06/30/20 1230  Location: Nose  Location Orientation: Medial  Staging: Stage 2 -  Partial thickness loss of  dermis presenting as a shallow open injury with a red, pink wound bed without slough.  Wound Description (Comments):   Present on Admission: Yes     Subjective: Patient is awake and alert, out of bed to chair, no nausea or vomiting, dyspnea and lower extremity continue to improve.   Objective: Vitals:   07/08/20 1654 07/08/20 1946 07/09/20 0332 07/09/20 0828  BP: (!) 114/54 (!) 104/53 102/61 115/61  Pulse: (!) 101 90 91 88  Resp: 16 18 18    Temp: 98.1 F (36.7 C) 99 F (37.2 C) 97.6 F (36.4 C)   TempSrc:  Oral Axillary   SpO2: (!) 85% 93% 94% 93%  Weight:      Height:        Intake/Output Summary (Last 24 hours) at 07/09/2020 1015 Last data filed at 07/09/2020 0630 Gross per 24 hour  Intake 960 ml  Output 1550 ml  Net -590 ml   Filed Weights   07/06/20 0500 07/07/20 0500 07/08/20 0500  Weight: 81.6 kg 84.8 kg 83.9 kg    Examination:   General: Not in pain or dyspnea, deconditioned  Neurology: Awake and alert, non focal  E ENT: no pallor, no icterus, oral mucosa moist Cardiovascular: No JVD. S1-S2 present, rhythmic, no gallops, rubs, or murmurs. No lower extremity edema. Pulmonary: positive breath sounds bilaterally, with no wheezing, or rhonchi, bilateral rales, more at bases. Gastrointestinal. Abdomen soft and non tender.  Skin. No rashes Musculoskeletal: no joint deformities     Data Reviewed: I have personally reviewed following labs and imaging studies  CBC: Recent Labs  Lab 07/03/20 0732 07/04/20 0248  WBC 9.0 7.0  NEUTROABS 7.3 5.6  HGB 11.8* 11.7*  HCT 38.7 38.7  MCV 95.8 95.8  PLT 68* 57*   Basic Metabolic Panel: Recent Labs  Lab 07/02/20 1642 07/02/20 1642 07/03/20 0732 07/03/20 0732 07/04/20 0248 07/04/20 0248 07/05/20 0414 07/06/20 0410 07/07/20 0406 07/08/20 0336 07/09/20 0427  NA  --   --  143   < > 142   < > 142 140 137 137 137  K 3.6   < > 3.7   < > 3.7   < > 3.7 3.9 4.0 3.7 4.0  CL  --   --  86*   < > 81*   < > 82* 86*  83* 82* 84*  CO2  --   --  49*   < > 50*   < > 47* 41* 40* 43* 43*  GLUCOSE  --   --  109*   < > 130*   < > 120* 138* 143* 138* 193*  BUN  --   --  17   < > 13   < > 16 26* 34* 36* 35*  CREATININE  --   --  0.38*   < > 0.33*   < > 0.40* 0.44 0.53 0.47 0.54  CALCIUM  --   --  8.1*   < > 7.9*   < > 8.7* 8.6* 9.0 8.3* 8.6*  MG 1.7  --  1.9  --  2.5*  --   --   --   --   --  2.0  PHOS 3.6  --  2.6  --  2.0*  --   --   --   --   --   --    < > = values in this interval not displayed.   GFR: Estimated Creatinine Clearance: 65.6 mL/min (by C-G formula based on SCr of 0.54 mg/dL). Liver Function Tests: Recent Labs  Lab 07/04/20 0248 07/05/20 0414 07/06/20 0410 07/07/20 0406 07/08/20 0336  AST 14* 16 13* 16 18  ALT 16 17 15 19 21   ALKPHOS 56 55 54 62 60  BILITOT 1.9* 2.3* 2.0* 1.9* 1.8*  PROT 5.3* 5.8* 5.6* 6.0* 5.5*  ALBUMIN 3.3* 3.5 3.4* 3.5 3.4*   No results for input(s): LIPASE, AMYLASE in the last 168 hours. No results for input(s): AMMONIA in the last 168 hours. Coagulation Profile: No results for input(s): INR, PROTIME in the last 168 hours. Cardiac Enzymes: No results for input(s): CKTOTAL, CKMB, CKMBINDEX, TROPONINI in the last 168 hours. BNP (last 3 results) No results for input(s): PROBNP in the last 8760 hours. HbA1C: No results for input(s): HGBA1C in the last 72 hours. CBG: No results for input(s): GLUCAP in the last 168 hours. Lipid Profile: No results for input(s): CHOL, HDL, LDLCALC, TRIG, CHOLHDL, LDLDIRECT in the last 72 hours. Thyroid Function Tests: No results for input(s): TSH, T4TOTAL, FREET4, T3FREE, THYROIDAB in the last 72 hours. Anemia Panel: Recent Labs    07/07/20 0406 07/08/20 0336  FERRITIN 216 224      Radiology Studies: I have reviewed all of the imaging during this hospital visit personally     Scheduled Meds: . vitamin C  500 mg Oral Daily  . carvedilol  6.25 mg Oral BID WC  . chlorhexidine  15 mL Mouth Rinse  BID  .  Chlorhexidine Gluconate Cloth  6 each Topical Daily  . enoxaparin (LOVENOX) injection  40 mg Subcutaneous Q24H  . furosemide  80 mg Intravenous Q12H  . influenza vaccine adjuvanted  0.5 mL Intramuscular Tomorrow-1000  . Ipratropium-Albuterol  2 puff Inhalation Q6H  . mouth rinse  15 mL Mouth Rinse q12n4p  . nystatin   Topical BID  . pneumococcal 23 valent vaccine  0.5 mL Intramuscular Tomorrow-1000  . zinc sulfate  220 mg Oral Daily   Continuous Infusions:   LOS: 10 days        Virdie Penning Gerome Apley, MD

## 2020-07-09 NOTE — Progress Notes (Signed)
Physical Therapy Treatment Patient Details Name: [name] MRN: [id number] DOB: 07-Nov-1950 Today's Date: 07/09/2020    History of Present Illness 69 yo female admitted with COVID Pna, encephalopathy. Hx of obesity hypoventilation syndrome, obesity.    PT Comments    Progressing with mobility. O2 91% on 5L [place] at rest, 87% on 6L with activity. Pt tolerated activity fairly well. Rest breaks taken as needed during session. Discussed d/c-pt is adamantly refusing placement. She prefers to return home where she has family that she states can assist as needed. Will continue to follow and progress activity as tolerated.     Follow Up Recommendations  SNF (pt refusing placement, so HHPT and 24/7 supervision, assist)     Equipment Recommendations  None recommended by PT    Recommendations for Other Services       Precautions / Restrictions Precautions Precautions: Fall Precaution Comments: monitor sats Restrictions Weight Bearing Restrictions: No    Mobility  Bed Mobility               General bed mobility comments: oob in recliner  Transfers Overall transfer level: Needs assistance Equipment used: Rolling walker (2 wheeled) Transfers: Sit to/from Stand Sit to Stand: Min assist;Min guard         General transfer comment: Assist to power up, stabilize. Increased time. Cues for safety, technique, hand placement. Asks for help even though she can do it unassisted. Stood x 3 with Min guard during STS exercises.  Ambulation/Gait Ambulation/Gait assistance: Min guard Gait Distance (Feet): 5 Feet Assistive device: Rolling walker (2 wheeled) Gait Pattern/deviations: Step-through pattern;Decreased stride length     General Gait Details: Pt walked forwards 5 feet then backwards 5 feet. O2 87% on 6L HFNC.   Stairs             Wheelchair Mobility    Modified Rankin (Stroke Patients Only)       Balance                                             Cognition Arousal/Alertness: Awake/alert Behavior During Therapy: WFL for tasks assessed/performed Overall Cognitive Status: No family/caregiver present to determine baseline cognitive functioning Area of Impairment: Problem solving                     Memory: Decreased short-term memory Following Commands: Follows one step commands consistently     Problem Solving: Requires verbal cues        Exercises Other Exercises Other Exercises: Sit to Stand x 3 reps without rest    General Comments        Pertinent Vitals/Pain Pain Assessment: No/denies pain    Home Living                      Prior Function            PT Goals (current goals can now be found in the care plan section) Progress towards PT goals: Progressing toward goals    Frequency    Min 3X/week      PT Plan Frequency needs to be updated    Co-evaluation              AM-PAC PT "6 Clicks" Mobility   Outcome Measure  Help needed turning from your back to your side while in a flat bed without  using bedrails?: A Lot Help needed moving from lying on your back to sitting on the side of a flat bed without using bedrails?: A Lot Help needed moving to and from a bed to a chair (including a wheelchair)?: A Little Help needed standing up from a chair using your arms (e.g., wheelchair or bedside chair)?: A Little Help needed to walk in hospital room?: A Little Help needed climbing 3-5 steps with a railing? : A Lot 6 Click Score: 15    End of Session Equipment Utilized During Treatment: Oxygen Activity Tolerance: Patient tolerated treatment well Patient left: in chair;with call bell/phone within reach;with chair alarm set   PT Visit Diagnosis: Difficulty in walking, not elsewhere classified (R26.2);Muscle weakness (generalized) (M62.81)     Time: 7017-7939 PT Time Calculation (min) (ACUTE ONLY): 25 min  Charges:  $Gait Training: 23-37 mins                          Doreatha Massed, PT Acute Rehabilitation  Office: [phone] Pager: [phone]

## 2020-07-10 DIAGNOSIS — J9621 Acute and chronic respiratory failure with hypoxia: Secondary | ICD-10-CM | POA: Diagnosis not present

## 2020-07-10 DIAGNOSIS — D693 Immune thrombocytopenic purpura: Secondary | ICD-10-CM | POA: Diagnosis not present

## 2020-07-10 DIAGNOSIS — K2100 Gastro-esophageal reflux disease with esophagitis, without bleeding: Secondary | ICD-10-CM | POA: Diagnosis not present

## 2020-07-10 DIAGNOSIS — U071 COVID-19: Secondary | ICD-10-CM | POA: Diagnosis not present

## 2020-07-10 LAB — BASIC METABOLIC PANEL
Anion gap: 13 (ref 5–15)
BUN: 40 mg/dL — ABNORMAL HIGH (ref 8–23)
CO2: 42 mmol/L — ABNORMAL HIGH (ref 22–32)
Calcium: 8.9 mg/dL (ref 8.9–10.3)
Chloride: 81 mmol/L — ABNORMAL LOW (ref 98–111)
Creatinine, Ser: 0.71 mg/dL (ref 0.44–1.00)
GFR, Estimated: >60 mL/min (ref >60)
Glucose, Bld: 191 mg/dL — ABNORMAL HIGH (ref 70–99)
Potassium: 3.5 mmol/L (ref 3.5–5.1)
Sodium: 136 mmol/L (ref 135–145)

## 2020-07-10 MED ORDER — TORSEMIDE 20 MG PO TABS: 60.0000 mg | ORAL_TABLET | Freq: Every day | ORAL | 0 refills | Status: DC

## 2020-07-10 MED ORDER — TORSEMIDE 20 MG PO TABS: 60.0000 mg | ORAL_TABLET | Freq: Every day | ORAL | Status: DC

## 2020-07-10 NOTE — TOC Transition Note (Signed)
Transition of Care [facility]) - CM/SW Discharge Note   Patient Details  Name: [name] MRN: [id number] Date of Birth: [birth year]  Transition of Care [facility] CM/SW Contact:  Trish Mage, LCSW Phone Number: 07/10/2020, 12:01 PM   Clinical Narrative:   Patient to d/c today. Family alerted. PTAR arranged. [facility] alerted.  Nursing, please call daughter [name] when Corey Harold arrives so she knows when to expect her mother.  No further needs identified.  TOC sign off.    Final next level of care: [place] Barriers to Discharge: Barriers Resolved   Patient Goals and CMS Choice Patient states their goals for this hospitalization and ongoing recovery are:: to go back home CMS Medicare.gov Compare Post Acute Care list provided to:: Patient    Discharge Placement                       Discharge Plan and Services   Discharge Planning Services: CM Consult                                 Social Determinants of Health (SDOH) Interventions     Readmission Risk Interventions No flowsheet data found.

## 2020-07-10 NOTE — Progress Notes (Signed)
Occupational Therapy Treatment Patient Details Name: [name] MRN: [id number] DOB: October 30, 1950 Today's Date: 07/10/2020    History of present illness 69 yo female admitted with COVID Pna, encephalopathy. Hx of obesity hypoventilation syndrome, obesity.   OT comments  Patient requires increased time and encouragement for ambulation to bathroom. Patient reports she wants to go home but is nervous. Educate patient that activity in hospital will help build confidence for participating in self care at home. Patient min A for safety with rolling walker to ambulate into bathroom with cues for hand placement. Patient set up for peri care seated on toilet and max A to don mesh underwear. Encourage patient to assist with pulling up in front, patient initiates then states "i'm going to fall," provide cues for safety and encouragement. Patient on 6L portable O2 during bathroom ADLs, note O2 at 88% with minimal shortness of breath. Recommend continued acute OT services for further endurance training necessary for participation in self care.   Follow Up Recommendations  Home health OT    Equipment Recommendations  None recommended by OT       Precautions / Restrictions Precautions Precautions: Fall Precaution Comments: monitor sats       Mobility Bed Mobility               General bed mobility comments: in recliner  Transfers Overall transfer level: Needs assistance Equipment used: Rolling walker (2 wheeled) Transfers: Sit to/from Stand Sit to Stand: Min assist         General transfer comment: cues for hand placement and min A for safety as patient is anxious with mobility    Balance Overall balance assessment: Needs assistance Sitting-balance support: Feet supported Sitting balance-Leahy Scale: Fair Sitting balance - Comments: seated at edge of recliner   Standing balance support: Single extremity supported;During functional activity Standing balance-Leahy Scale: Poor  Standing balance comment: keeps UE on walker to pull up underwear                           ADL either performed or assessed with clinical judgement   ADL Overall ADL's : Needs assistance/impaired     Grooming: Wash/dry hands;Set up;Sitting               Lower Body Dressing: Maximal assistance;Sitting/lateral leans Lower Body Dressing Details (indicate cue type and reason): patient reports DTR helps with getting on underwear, encourage patient to pull up in front while OT assist with back, patient becomes anxious "I'm falling!" cues for safety/reassurance  Toilet Transfer: Minimal assistance;Ambulation;RW;Regular Toilet;Grab bars Toilet Transfer Details (indicate cue type and reason): cues to utilize grab bar for eccentric control onto toilet Toileting- Clothing Manipulation and Hygiene: Set up;Sitting/lateral lean Toileting - Clothing Manipulation Details (indicate cue type and reason): encourage patient to attempt pericare after voiding, patient able to reach behind in sitting to complete     Functional mobility during ADLs: Minimal assistance;Rolling walker;Cueing for safety                 Cognition Arousal/Alertness: Awake/alert Behavior During Therapy: WFL for tasks assessed/performed Overall Cognitive Status: No family/caregiver present to determine baseline cognitive functioning                                 General Comments: requires motivation to perform tasks herself  General Comments patient on 6L during activity note 88% in bathroom with minimal shortness of breath     Pertinent Vitals/ Pain       Pain Assessment: Faces Faces Pain Scale: Hurts little more Pain Location: LEs Pain Descriptors / Indicators:  (stiff) Pain Intervention(s): Monitored during session         Frequency  Min 2X/week        Progress Toward Goals  OT Goals(current goals can now be found in the care plan section)  Progress  towards OT goals: Progressing toward goals  Acute Rehab OT Goals Patient Stated Goal: to go home at discharge OT Goal Formulation: With patient Time For Goal Achievement: 07/20/20 Potential to Achieve Goals: Good  Plan Discharge plan remains appropriate       AM-PAC OT "6 Clicks" Daily Activity     Outcome Measure   Help from another person eating meals?: None Help from another person taking care of personal grooming?: A Little Help from another person toileting, which includes using toliet, bedpan, or urinal?: A Little Help from another person bathing (including washing, rinsing, drying)?: A Lot Help from another person to put on and taking off regular upper body clothing?: A Little Help from another person to put on and taking off regular lower body clothing?: A Lot 6 Click Score: 17    End of Session Equipment Utilized During Treatment: Rolling walker;Oxygen  OT Visit Diagnosis: Unsteadiness on feet (R26.81);Muscle weakness (generalized) (M62.81)   Activity Tolerance Patient tolerated treatment well   Patient Left in chair;with call bell/phone within reach;with nursing/sitter in room   Nurse Communication Mobility status        Time: 2080-2233 OT Time Calculation (min): 41 min  Charges: OT General Charges $OT Visit: 1 Visit OT Treatments $Self Care/Home Management : 38-52 mins  Delbert Phenix OT OT pager: [phone]   Rosemary Holms 07/10/2020, 11:39 AM

## 2020-07-10 NOTE — Discharge Summary (Addendum)
Physician Discharge Summary  [name] JHE:[id number] DOB: [birth year] DOA: 06/29/2020  PCP: [name], MD  Admit date: 06/29/2020 Discharge date: 07/10/2020  Admitted From: Home  Disposition:  Home (declined SNF)  Recommendations for Outpatient Follow-up and new medication changes:  1. Follow up with Dr. Konrad Penta in 14 days.  2. Continue self quarantine for 2 weeks, use a mask in public and maintain physical distancing. It is recommended to get COVID 19 vaccine after recovery per primary care.  3. Furosemide change to torsemide.   I spoke over the phone with the patient's daughter about patient's  condition, plan of care, prognosis and all questions were addressed.  Home Health: yes   Equipment/Devices: home 02    Discharge Condition: stable  CODE STATUS: full  Diet recommendation: heart healthy with fluid restriction 1200 ml per day.   Brief/Interim Summary: Mrs. Boernerwas admitted with a working diagnosis of acute hypoxic/hypercapnic respiratory failure due to SARS COVID-19 viral pneumonia.Prolonged hospitalization complicated with pulmonary edema and bilateral pleural effusions.  69 year old female was brought to the hospital due to oxygen saturation down to the 80s, she was unable to give any detailed history due to metabolic encephalopathy.On her initial physical examination blood pressure 124/52, heart rate 116, temperature 99.4, respiratory rate 22, oxygen saturation 100% supplemental oxygen per noninvasive mechanical ventilation. Heart S1-S2, present rhythmic, her lungs had bilateral wheezing, abdomen protuberant, soft nontender, positive bilateral lower extremity edema 4+. Sodium 137, potassium 4.2, chloride 96, bicarb 30, glucose 82, BUN 15, creatinine 0.66, white count 6.3, hemoglobin 9.3, hematocrit 38.4, platelets 58. ABG, pH 7.26, PCO2 77.1, PaO2 78, bicarb 38, oxygen saturation 93%. SARS COVID-19 positive EKG 109 bpm, normal axis, normal intervals,  sinus rhythm with poor R wave progression, no ST segment or T wave changes.  Chest radiograph withrightlower lobe opacity withsmall right pleural effusion, left lower lobe interstitial infiltrate.  Patient was medically treated with remdesivir, and systemic corticosteroids. She received IV furosemide for diuresis with good toleration, for cardiogenic pulmonary edema.  She has been very weak and deconditioned, will need SNF for discharge.Patient has declined SNF placement. At home uses 3 L/min per [place] of supplemental 02.  Follow chest film with persistent and worseningbilateral pleural effusions related todiastolic heart failure. Up escalated diuretic dose with improvement of dyspnea, follow up chest [place] with small effusions.   Patient will be discharge home to continue home health services.   1.  Acute on chronic hypoxic/hypercapnic respiratory failure due to SARS COVID-19 viral pneumonia. Patient was initially admitted to the progressive care unit, she received respiratory support, with noninvasive mechanical ventilation. Received medical therapy with systemic corticosteroids and remdesivir.  She was successfully transitioned to supplemental oxygen per nasal cannula. Inflammatory markers and symptoms along with oxygen requirements improved.  At discharge her oxygenation is 98% on 5 L per nasal cannula.  Will recommend to continue to continue 5 L and titrate at home.  Home health services have been ordered.  2.  Acute on chronic diastolic heart failure exacerbation, acute cardiogenic pulmonary edema bilateral pleural effusions.  Echocardiography with preserved LV systolic function, ejection fraction more than 75% preserved RV systolic function.  Patient had significant volume overload, she received intravenous furosemide with good toleration. She achieved a negative fluid balance of 12,654 mL since admission, with significant improvement of her symptoms.  Patient will continue  diuresis at home with torsemide, follow-up as an outpatient.  3.  Hypertension.  Continue carvedilol, at discharge will resume lisinopril.  4.  Hypokalemia/hypophosphatemia, hypomagnesemia,  metabolic alkalosis.  Patient received diuresis with good toleration.  Electrolytes were corrected. At discharge sodium 136, potassium 3.5, chloride 81, bicarb 42, glucose 191, BUN 40, creatinine 0.71.  5.  Obesity class III/obstructive sleep apnea.  Her calculated BMI is 40, she had been refusing to use CPAP during her hospitalization.  6.  Chronic lymphedema lower extremities, amatory dysfunction.  She has chronic skin damage of the lower extremities, wound care was continued.  Documented to be stage II, present on admission.  Patient will continue wound care as an outpatient.  7.  Stage II pressure ulcer at the nose.  She had dressing in place, follow-up as an outpatient.  Discharge Diagnoses:  Principal Problem:   Pneumonia due to COVID-19 virus Active Problems:   HTN (hypertension)   Chronic idiopathic thrombocytopenia (HCC)   GERD (gastroesophageal reflux disease)   Obesity hypoventilation syndrome (HCC)   Acute on chronic respiratory failure with hypoxia and hypercapnia (HCC)    Discharge Instructions   Allergies as of 07/10/2020   No Known Allergies     Medication List    STOP taking these medications   furosemide 80 MG tablet Commonly known as: LASIX     TAKE these medications   Baclofen 5 MG Tabs Take 5 mg by mouth 3 (three) times daily.   carvedilol 3.125 MG tablet Commonly known as: COREG Take 1 tablet (3.125 mg total) by mouth 2 (two) times daily with a meal. What changed: how much to take   diclofenac Sodium 1 % Gel Commonly known as: VOLTAREN Apply 4 g topically 4 (four) times daily as needed (knee pain).   loperamide 2 MG capsule Commonly known as: IMODIUM Take 1 capsule (2 mg total) by mouth as needed for diarrhea or loose stools.   potassium chloride 10 MEQ  tablet Commonly known as: KLOR-CON Take 1 tablet (10 mEq total) by mouth daily.   ramelteon 8 MG tablet Commonly known as: ROZEREM Take 1 tablet (8 mg total) by mouth at bedtime.   torsemide 20 MG tablet Commonly known as: DEMADEX Take 3 tablets (60 mg total) by mouth daily. Start taking on: July 11, 2020            Durable Medical Equipment  (From admission, onward)         Start     Ordered   07/10/20 0867  For home use only DME oxygen  Once       Question Answer Comment  Length of Need 6 Months   Mode or (Route) Nasal cannula   Liters per Minute 5   Frequency Continuous (stationary and portable oxygen unit needed)   Oxygen conserving device Yes   Oxygen delivery system Gas      07/10/20 0922          No Known Allergies      Procedures/Studies: DG Chest 1 View  Result Date: 07/07/2020 CLINICAL DATA:  Shortness of breath.  COVID positive. EXAM: CHEST  1 VIEW COMPARISON:  07/04/2020 FINDINGS: This is a low volume study. Pleural effusions and bilateral LOWER lung atelectasis/consolidation are not significantly changed. There is no evidence of pneumothorax. No acute bony abnormalities are present. IMPRESSION: Unchanged pleural effusions and bilateral LOWER lung atelectasis/consolidation. Electronically Signed   By: Margarette Canada M.D.   On: 07/07/2020 14:26   DG Chest 1 View  Result Date: 07/04/2020 CLINICAL DATA:  COVID pneumonia, dyspnea EXAM: CHEST  1 VIEW COMPARISON:  06/29/2020 FINDINGS: The patient is moderately rotated on this examination.  Lung volumes are extremely small, however, pulmonary insufflation remain stable since prior examination. Moderate right pleural effusion appears stable. Moderate left pleural effusion has enlarged in the interval since prior examination. Right basilar collapse and consolidation appears stable. There is vascular crowding at the hila bilaterally without superimposed overt pulmonary edema. No pneumothorax. Corpectomy  appliance again noted. IMPRESSION: Moderate bilateral pleural effusions, enlarging on the left with associated bibasilar atelectasis. Stable right basilar collapse and consolidation. Electronically Signed   By: Fidela Salisbury MD   On: 07/04/2020 15:52   [place] CHEST (PLEURAL EFFUSION)  Result Date: 07/09/2020 CLINICAL DATA:  Pleural effusion EXAM: CHEST ULTRASOUND COMPARISON:  Chest radiograph 07/07/2020 FINDINGS: Small BILATERAL pleural effusions are identified. IMPRESSION: Small BILATERAL pleural effusions. Electronically Signed   By: Lavonia Dana M.D.   On: 07/09/2020 16:25   DG Chest Port 1 View  Result Date: 06/29/2020 CLINICAL DATA:  Shortness of breath.  Known COVID exposure. EXAM: PORTABLE CHEST 1 VIEW COMPARISON:  Chest x-ray dated May 13, 2020. FINDINGS: Cardiomegaly. Normal pulmonary vascularity. Similar appearing low lung volumes with chronically elevated right hemidiaphragm. Streaky opacities at the lung bases, slightly progressed since the prior study. No pneumothorax or large pleural effusion. No acute osseous abnormality. Prior corpectomy near the thoracolumbar junction. IMPRESSION: 1. Slightly worsened bibasilar atelectasis versus infiltrates. Electronically Signed   By: Titus Dubin M.D.   On: 06/29/2020 14:39        Subjective: Patient with improved dyspnea, no nausea or vomiting, no chest pain.   Discharge Exam: Vitals:   07/09/20 2012 07/10/20 0416  BP: (!) 104/59 104/67  Pulse: 87 78  Resp: 19 17  Temp: 97.9 F (36.6 C) 97.9 F (36.6 C)  SpO2: 96% 98%   Vitals:   07/09/20 0828 07/09/20 1414 07/09/20 2012 07/10/20 0416  BP: 115/61 122/69 (!) 104/59 104/67  Pulse: 88 79 87 78  Resp:  16 19 17   Temp:  97.9 F (36.6 C) 97.9 F (36.6 C) 97.9 F (36.6 C)  TempSrc:  Oral    SpO2: 93% 96% 96% 98%  Weight:      Height:        General: Not in pain or dyspnea.  Neurology: Awake and alert, non focal  E ENT: mild pallor, no icterus, oral mucosa  moist Cardiovascular: No JVD. S1-S2 present, rhythmic, no gallops, rubs, or murmurs. Mild lower extremity edema/ lymphedema. Pulmonary: vesicular breath sounds bilaterally, adequate air movement, no wheezing, rhonchi or rales. Gastrointestinal. Abdomen soft and non tender Skin. No rashes Musculoskeletal: no joint deformities   The results of significant diagnostics from this hospitalization (including imaging, microbiology, ancillary and laboratory) are listed below for reference.     Microbiology: Recent Results (from the past 240 hour(s))  MRSA PCR Screening     Status: None   Collection Time: 06/30/20 12:50 PM   Specimen: Nasal Mucosa; Nasopharyngeal  Result Value Ref Range Status   MRSA by PCR NEGATIVE NEGATIVE Final    Comment:        The GeneXpert MRSA Assay (FDA approved for NASAL specimens only), is one component of a comprehensive MRSA colonization surveillance program. It is not intended to diagnose MRSA infection nor to guide or monitor treatment for MRSA infections. Performed at [facility], [place] [address] [place], [place] [address]      Labs: BNP (last 3 results) Recent Labs    04/19/20 0341  BNP 027.2*   Basic Metabolic Panel: Recent Labs  Lab 07/04/20 0248 07/05/20 0414  07/06/20 0410 07/07/20 0406 07/08/20 0336 07/09/20 0427 07/10/20 0348  NA 142   < > 140 137 137 137 136  K 3.7   < > 3.9 4.0 3.7 4.0 3.5  CL 81*   < > 86* 83* 82* 84* 81*  CO2 50*   < > 41* 40* 43* 43* 42*  GLUCOSE 130*   < > 138* 143* 138* 193* 191*  BUN 13   < > 26* 34* 36* 35* 40*  CREATININE 0.33*   < > 0.44 0.53 0.47 0.54 0.71  CALCIUM 7.9*   < > 8.6* 9.0 8.3* 8.6* 8.9  MG 2.5*  --   --   --   --  2.0  --   PHOS 2.0*  --   --   --   --   --   --    < > = values in this interval not displayed.   Liver Function Tests: Recent Labs  Lab 07/04/20 0248 07/05/20 0414 07/06/20 0410 07/07/20 0406 07/08/20 0336  AST 14* 16 13* 16 18  ALT 16 17 15 19  21   ALKPHOS 56 55 54 62 60  BILITOT 1.9* 2.3* 2.0* 1.9* 1.8*  PROT 5.3* 5.8* 5.6* 6.0* 5.5*  ALBUMIN 3.3* 3.5 3.4* 3.5 3.4*   No results for input(s): LIPASE, AMYLASE in the last 168 hours. No results for input(s): AMMONIA in the last 168 hours. CBC: Recent Labs  Lab 07/04/20 0248  WBC 7.0  NEUTROABS 5.6  HGB 11.7*  HCT 38.7  MCV 95.8  PLT 57*   Cardiac Enzymes: No results for input(s): CKTOTAL, CKMB, CKMBINDEX, TROPONINI in the last 168 hours. BNP: Invalid input(s): POCBNP CBG: No results for input(s): GLUCAP in the last 168 hours. D-Dimer Recent Labs    07/08/20 0336  DDIMER 0.39   Hgb A1c No results for input(s): HGBA1C in the last 72 hours. Lipid Profile No results for input(s): CHOL, HDL, LDLCALC, TRIG, CHOLHDL, LDLDIRECT in the last 72 hours. Thyroid function studies No results for input(s): TSH, T4TOTAL, T3FREE, THYROIDAB in the last 72 hours.  Invalid input(s): FREET3 Anemia work up Recent Labs    07/08/20 0336  FERRITIN 224   Urinalysis    Component Value Date/Time   COLORURINE YELLOW 06/29/2020 [place] 06/29/2020 1427   LABSPEC 1.013 06/29/2020 1427   PHURINE 5.0 06/29/2020 1427   GLUCOSEU NEGATIVE 06/29/2020 1427   HGBUR NEGATIVE 06/29/2020 1427   BILIRUBINUR NEGATIVE 06/29/2020 1427   KETONESUR 5 (A) 06/29/2020 1427   PROTEINUR 30 (A) 06/29/2020 1427   UROBILINOGEN 0.2 11/13/2014 0233   NITRITE NEGATIVE 06/29/2020 1427   LEUKOCYTESUR NEGATIVE 06/29/2020 1427   Sepsis Labs Invalid input(s): PROCALCITONIN,  WBC,  LACTICIDVEN Microbiology Recent Results (from the past 240 hour(s))  MRSA PCR Screening     Status: None   Collection Time: 06/30/20 12:50 PM   Specimen: Nasal Mucosa; Nasopharyngeal  Result Value Ref Range Status   MRSA by PCR NEGATIVE NEGATIVE Final    Comment:        The GeneXpert MRSA Assay (FDA approved for NASAL specimens only), is one component of a comprehensive MRSA colonization surveillance  program. It is not intended to diagnose MRSA infection nor to guide or monitor treatment for MRSA infections. Performed at [facility], [place] [address] [place], [place] [address]      Time coordinating discharge: 45 minutes  SIGNED:   Tawni Millers, MD  [facility] 07/10/2020, 9:02 AM

## 2020-07-10 NOTE — Consult Note (Signed)
   [facility] CM Inpatient Consult   07/10/2020  MADDYSON KEIL May 04, 1951 [id number]    Patient chart reviewed for potential [facility] CM) services due to high unplanned readmission risk score. Attempts to reach patient in September 2021 were unsuccessful. Spoke with Ms. Steenson by telephone. HIPAA verified. Explained [facility] CM services as outpatient case management and disease management services. Will notify embedded case management team at PCP office of patient disposition.  Of note, [facility] Care Management services does not replace or interfere with any services that are arranged by inpatient case management or social work.  Netta Cedars, MSN, [facility] Liaison Nurse Mobile Phone [phone]  Toll free office [phone]

## 2020-07-10 NOTE — Progress Notes (Signed)
Patient will be discharging today with belongings. Will call daughter to let her know when patient is on her way.

## 2020-07-10 NOTE — Progress Notes (Signed)
Pt. not using BiPAP/CPAP this admission, remains s/u@bedside  if needed, aware to notify.

## 2020-07-13 ENCOUNTER — Telehealth: Payer: Self-pay | Admitting: Student

## 2020-07-13 NOTE — Telephone Encounter (Signed)
TOC Appointment 07/24/2020 at 2:15 pm.  Unable to reach patient phone call was disconnected and unable to leave voice message.  Appointment letter mailed with appt date and time.

## 2020-07-13 NOTE — Telephone Encounter (Signed)
-----   Message from [name], MD sent at 07/10/2020  6:03 PM EDT ----- Regarding: Appointment Needed Hello,   Could we please schedule Ms. Langi a clinic appointment for [place] in 2 weeks?  Thank you,  [name]

## 2020-07-16 ENCOUNTER — Telehealth: Payer: Medicare PPO

## 2020-07-17 ENCOUNTER — Telehealth: Payer: Medicare PPO

## 2020-07-17 ENCOUNTER — Telehealth: Payer: Self-pay | Admitting: *Deleted

## 2020-07-17 NOTE — Telephone Encounter (Signed)
  Chronic Care Management   Note  07/17/2020 Name: [name] MRN: [id number] DOB: 1951/01/13   Received referral from [facility] hospital liaison Netta Cedars to speak with patient regarding chronic care management services.  Phone number listed as contact does not work; rings then no option to leave message and no recording heard.    Follow up plan: Will ask Amber Chrismon CCM BSW to speak with patient about CCM services during patient's clinic visit on 07/24/20.  Kelli Churn RN, CCM, [facility] RN Care Manager [phone]

## 2020-07-19 DIAGNOSIS — I5033 Acute on chronic diastolic (congestive) heart failure: Secondary | ICD-10-CM | POA: Diagnosis not present

## 2020-07-19 DIAGNOSIS — J9610 Chronic respiratory failure, unspecified whether with hypoxia or hypercapnia: Secondary | ICD-10-CM | POA: Diagnosis not present

## 2020-07-19 DIAGNOSIS — J9611 Chronic respiratory failure with hypoxia: Secondary | ICD-10-CM | POA: Diagnosis not present

## 2020-07-19 DIAGNOSIS — E662 Morbid (severe) obesity with alveolar hypoventilation: Secondary | ICD-10-CM | POA: Diagnosis not present

## 2020-07-19 DIAGNOSIS — R269 Unspecified abnormalities of gait and mobility: Secondary | ICD-10-CM | POA: Diagnosis not present

## 2020-07-20 NOTE — Telephone Encounter (Signed)
Transition Care Management Unsuccessful Follow-up Telephone Call  Date of discharge and from where:  07/10/2020 from [facility]  Attempts:  Made 2 attempts to reach patient for TOC.   Reason for unsuccessful TCM follow-up call:  Unable to leave message Phone picks up then line goes dead x 2.  L. [place], BSN, RN-BC

## 2020-07-24 ENCOUNTER — Encounter: Payer: Medicare PPO | Admitting: Internal Medicine

## 2020-07-24 ENCOUNTER — Ambulatory Visit: Payer: Medicare PPO

## 2020-07-24 ENCOUNTER — Telehealth: Payer: Self-pay

## 2020-07-24 NOTE — Telephone Encounter (Signed)
  Chronic Care Management   07/24/2020 Name: [name] MRN: [id number] DOB: 15-Feb-1951  CCM BSW planned to meet with patient today during scheduled office visit for program introduction and enrollment, however, she did not show for appointment.      Ronn Melena, [place] Coordination Social Worker [place] [phone]

## 2020-07-27 DIAGNOSIS — J9611 Chronic respiratory failure with hypoxia: Secondary | ICD-10-CM | POA: Diagnosis not present

## 2020-07-27 DIAGNOSIS — I5033 Acute on chronic diastolic (congestive) heart failure: Secondary | ICD-10-CM | POA: Diagnosis not present

## 2020-07-27 DIAGNOSIS — R269 Unspecified abnormalities of gait and mobility: Secondary | ICD-10-CM | POA: Diagnosis not present

## 2020-07-27 DIAGNOSIS — E662 Morbid (severe) obesity with alveolar hypoventilation: Secondary | ICD-10-CM | POA: Diagnosis not present

## 2020-07-30 ENCOUNTER — Ambulatory Visit: Payer: Self-pay | Admitting: *Deleted

## 2020-07-30 DIAGNOSIS — I10 Essential (primary) hypertension: Secondary | ICD-10-CM

## 2020-07-30 DIAGNOSIS — I5032 Chronic diastolic (congestive) heart failure: Secondary | ICD-10-CM

## 2020-07-30 DIAGNOSIS — G8929 Other chronic pain: Secondary | ICD-10-CM

## 2020-07-30 NOTE — Chronic Care Management (AMB) (Signed)
  Chronic Care Management   Note  07/30/2020 Name: CURTISHA BENDIX MRN: [id number] DOB: 1951-01-16  Patient did not keep her hospital follow up clinic appointment on 07/24/20 and there is no working contact number for the patient.   Follow up plan: No further follow up required: as unable to reach patient - no fuctional contact number.  Kelli Churn RN, CCM, [facility] RN Care Manager [phone]

## 2020-08-04 DIAGNOSIS — J1282 Pneumonia due to coronavirus disease 2019: Secondary | ICD-10-CM | POA: Diagnosis not present

## 2020-08-04 DIAGNOSIS — L89322 Pressure ulcer of left buttock, stage 2: Secondary | ICD-10-CM | POA: Diagnosis not present

## 2020-08-04 DIAGNOSIS — L97811 Non-pressure chronic ulcer of other part of right lower leg limited to breakdown of skin: Secondary | ICD-10-CM | POA: Diagnosis not present

## 2020-08-04 DIAGNOSIS — I872 Venous insufficiency (chronic) (peripheral): Secondary | ICD-10-CM | POA: Diagnosis not present

## 2020-08-04 DIAGNOSIS — J9622 Acute and chronic respiratory failure with hypercapnia: Secondary | ICD-10-CM | POA: Diagnosis not present

## 2020-08-04 DIAGNOSIS — J9621 Acute and chronic respiratory failure with hypoxia: Secondary | ICD-10-CM | POA: Diagnosis not present

## 2020-08-04 DIAGNOSIS — U071 COVID-19: Secondary | ICD-10-CM | POA: Diagnosis not present

## 2020-08-04 DIAGNOSIS — I130 Hypertensive heart and chronic kidney disease with heart failure and stage 1 through stage 4 chronic kidney disease, or unspecified chronic kidney disease: Secondary | ICD-10-CM | POA: Diagnosis not present

## 2020-08-04 DIAGNOSIS — L97821 Non-pressure chronic ulcer of other part of left lower leg limited to breakdown of skin: Secondary | ICD-10-CM | POA: Diagnosis not present

## 2020-08-06 ENCOUNTER — Other Ambulatory Visit: Payer: Self-pay | Admitting: *Deleted

## 2020-08-06 MED ORDER — NYSTATIN 100000 UNIT/GM EX POWD: 1.0000 "application " | Freq: Three times a day (TID) | CUTANEOUS | 1 refills | Status: DC

## 2020-08-06 NOTE — Telephone Encounter (Signed)
VO  Calcium alginate w/ silver to open areas bil legs covering with sterile gauze and netting 2x week.  Stage 2 upper left buttocks, will use optcell w/ silver and foam dressing 2x week  Needs nystatin powder for abdominal folds  PCS services are requested by [facility]N  May call amanda [phone]

## 2020-08-07 ENCOUNTER — Telehealth: Payer: Self-pay | Admitting: *Deleted

## 2020-08-07 DIAGNOSIS — I872 Venous insufficiency (chronic) (peripheral): Secondary | ICD-10-CM | POA: Diagnosis not present

## 2020-08-07 DIAGNOSIS — J1282 Pneumonia due to coronavirus disease 2019: Secondary | ICD-10-CM | POA: Diagnosis not present

## 2020-08-07 DIAGNOSIS — I130 Hypertensive heart and chronic kidney disease with heart failure and stage 1 through stage 4 chronic kidney disease, or unspecified chronic kidney disease: Secondary | ICD-10-CM | POA: Diagnosis not present

## 2020-08-07 DIAGNOSIS — L97821 Non-pressure chronic ulcer of other part of left lower leg limited to breakdown of skin: Secondary | ICD-10-CM | POA: Diagnosis not present

## 2020-08-07 DIAGNOSIS — U071 COVID-19: Secondary | ICD-10-CM | POA: Diagnosis not present

## 2020-08-07 DIAGNOSIS — L97811 Non-pressure chronic ulcer of other part of right lower leg limited to breakdown of skin: Secondary | ICD-10-CM | POA: Diagnosis not present

## 2020-08-07 DIAGNOSIS — J9621 Acute and chronic respiratory failure with hypoxia: Secondary | ICD-10-CM | POA: Diagnosis not present

## 2020-08-07 DIAGNOSIS — L89322 Pressure ulcer of left buttock, stage 2: Secondary | ICD-10-CM | POA: Diagnosis not present

## 2020-08-07 DIAGNOSIS — J9622 Acute and chronic respiratory failure with hypercapnia: Secondary | ICD-10-CM | POA: Diagnosis not present

## 2020-08-07 NOTE — Telephone Encounter (Signed)
Confirmed with FO Staff, patient does not have medicaid so will not qualify for PCS. This would be an out of pocket expense to patient. Call placed to [facility] to make her aware. No answer. Left message on VM requesting return call.  Hubbard Hartshorn, BSN, RN-BC

## 2020-08-07 NOTE — Telephone Encounter (Addendum)
Information for PA for Nystatin Powder was sent to [facility] through CoverMyMeds.  Awaiting decision within 3-7 days.  Sander Nephew, RN 08/07/2020 9:13 AM.    Fax from [facility] PA for Nystatin Powder was approved 08/07/2020 thru 09/14/2021.  Sander Nephew, RN 08/07/2020 1:56 PM.

## 2020-08-08 DIAGNOSIS — L97811 Non-pressure chronic ulcer of other part of right lower leg limited to breakdown of skin: Secondary | ICD-10-CM | POA: Diagnosis not present

## 2020-08-08 DIAGNOSIS — L97821 Non-pressure chronic ulcer of other part of left lower leg limited to breakdown of skin: Secondary | ICD-10-CM | POA: Diagnosis not present

## 2020-08-08 DIAGNOSIS — J1282 Pneumonia due to coronavirus disease 2019: Secondary | ICD-10-CM | POA: Diagnosis not present

## 2020-08-08 DIAGNOSIS — I130 Hypertensive heart and chronic kidney disease with heart failure and stage 1 through stage 4 chronic kidney disease, or unspecified chronic kidney disease: Secondary | ICD-10-CM | POA: Diagnosis not present

## 2020-08-08 DIAGNOSIS — L89322 Pressure ulcer of left buttock, stage 2: Secondary | ICD-10-CM | POA: Diagnosis not present

## 2020-08-08 DIAGNOSIS — J9621 Acute and chronic respiratory failure with hypoxia: Secondary | ICD-10-CM | POA: Diagnosis not present

## 2020-08-08 DIAGNOSIS — U071 COVID-19: Secondary | ICD-10-CM | POA: Diagnosis not present

## 2020-08-08 DIAGNOSIS — I872 Venous insufficiency (chronic) (peripheral): Secondary | ICD-10-CM | POA: Diagnosis not present

## 2020-08-08 DIAGNOSIS — J9622 Acute and chronic respiratory failure with hypercapnia: Secondary | ICD-10-CM | POA: Diagnosis not present

## 2020-08-13 DIAGNOSIS — L89322 Pressure ulcer of left buttock, stage 2: Secondary | ICD-10-CM | POA: Diagnosis not present

## 2020-08-13 DIAGNOSIS — I872 Venous insufficiency (chronic) (peripheral): Secondary | ICD-10-CM | POA: Diagnosis not present

## 2020-08-13 DIAGNOSIS — L97821 Non-pressure chronic ulcer of other part of left lower leg limited to breakdown of skin: Secondary | ICD-10-CM | POA: Diagnosis not present

## 2020-08-13 DIAGNOSIS — U071 COVID-19: Secondary | ICD-10-CM | POA: Diagnosis not present

## 2020-08-13 DIAGNOSIS — I130 Hypertensive heart and chronic kidney disease with heart failure and stage 1 through stage 4 chronic kidney disease, or unspecified chronic kidney disease: Secondary | ICD-10-CM | POA: Diagnosis not present

## 2020-08-13 DIAGNOSIS — J1282 Pneumonia due to coronavirus disease 2019: Secondary | ICD-10-CM | POA: Diagnosis not present

## 2020-08-13 DIAGNOSIS — L97811 Non-pressure chronic ulcer of other part of right lower leg limited to breakdown of skin: Secondary | ICD-10-CM | POA: Diagnosis not present

## 2020-08-13 DIAGNOSIS — J9622 Acute and chronic respiratory failure with hypercapnia: Secondary | ICD-10-CM | POA: Diagnosis not present

## 2020-08-13 DIAGNOSIS — J9621 Acute and chronic respiratory failure with hypoxia: Secondary | ICD-10-CM | POA: Diagnosis not present

## 2020-08-16 DIAGNOSIS — L89322 Pressure ulcer of left buttock, stage 2: Secondary | ICD-10-CM | POA: Diagnosis not present

## 2020-08-16 DIAGNOSIS — J9622 Acute and chronic respiratory failure with hypercapnia: Secondary | ICD-10-CM | POA: Diagnosis not present

## 2020-08-16 DIAGNOSIS — I130 Hypertensive heart and chronic kidney disease with heart failure and stage 1 through stage 4 chronic kidney disease, or unspecified chronic kidney disease: Secondary | ICD-10-CM | POA: Diagnosis not present

## 2020-08-16 DIAGNOSIS — J9621 Acute and chronic respiratory failure with hypoxia: Secondary | ICD-10-CM | POA: Diagnosis not present

## 2020-08-16 DIAGNOSIS — J1282 Pneumonia due to coronavirus disease 2019: Secondary | ICD-10-CM | POA: Diagnosis not present

## 2020-08-16 DIAGNOSIS — U071 COVID-19: Secondary | ICD-10-CM | POA: Diagnosis not present

## 2020-08-16 DIAGNOSIS — L97811 Non-pressure chronic ulcer of other part of right lower leg limited to breakdown of skin: Secondary | ICD-10-CM | POA: Diagnosis not present

## 2020-08-16 DIAGNOSIS — L97821 Non-pressure chronic ulcer of other part of left lower leg limited to breakdown of skin: Secondary | ICD-10-CM | POA: Diagnosis not present

## 2020-08-16 DIAGNOSIS — I872 Venous insufficiency (chronic) (peripheral): Secondary | ICD-10-CM | POA: Diagnosis not present

## 2020-08-18 DIAGNOSIS — I130 Hypertensive heart and chronic kidney disease with heart failure and stage 1 through stage 4 chronic kidney disease, or unspecified chronic kidney disease: Secondary | ICD-10-CM | POA: Diagnosis not present

## 2020-08-18 DIAGNOSIS — J9622 Acute and chronic respiratory failure with hypercapnia: Secondary | ICD-10-CM | POA: Diagnosis not present

## 2020-08-18 DIAGNOSIS — I5033 Acute on chronic diastolic (congestive) heart failure: Secondary | ICD-10-CM | POA: Diagnosis not present

## 2020-08-18 DIAGNOSIS — U071 COVID-19: Secondary | ICD-10-CM | POA: Diagnosis not present

## 2020-08-18 DIAGNOSIS — L89322 Pressure ulcer of left buttock, stage 2: Secondary | ICD-10-CM | POA: Diagnosis not present

## 2020-08-18 DIAGNOSIS — J9611 Chronic respiratory failure with hypoxia: Secondary | ICD-10-CM | POA: Diagnosis not present

## 2020-08-18 DIAGNOSIS — E662 Morbid (severe) obesity with alveolar hypoventilation: Secondary | ICD-10-CM | POA: Diagnosis not present

## 2020-08-18 DIAGNOSIS — I872 Venous insufficiency (chronic) (peripheral): Secondary | ICD-10-CM | POA: Diagnosis not present

## 2020-08-18 DIAGNOSIS — J9610 Chronic respiratory failure, unspecified whether with hypoxia or hypercapnia: Secondary | ICD-10-CM | POA: Diagnosis not present

## 2020-08-18 DIAGNOSIS — J1282 Pneumonia due to coronavirus disease 2019: Secondary | ICD-10-CM | POA: Diagnosis not present

## 2020-08-18 DIAGNOSIS — L97811 Non-pressure chronic ulcer of other part of right lower leg limited to breakdown of skin: Secondary | ICD-10-CM | POA: Diagnosis not present

## 2020-08-18 DIAGNOSIS — L97821 Non-pressure chronic ulcer of other part of left lower leg limited to breakdown of skin: Secondary | ICD-10-CM | POA: Diagnosis not present

## 2020-08-18 DIAGNOSIS — R269 Unspecified abnormalities of gait and mobility: Secondary | ICD-10-CM | POA: Diagnosis not present

## 2020-08-18 DIAGNOSIS — J9621 Acute and chronic respiratory failure with hypoxia: Secondary | ICD-10-CM | POA: Diagnosis not present

## 2020-08-20 DIAGNOSIS — J9621 Acute and chronic respiratory failure with hypoxia: Secondary | ICD-10-CM | POA: Diagnosis not present

## 2020-08-20 DIAGNOSIS — U071 COVID-19: Secondary | ICD-10-CM | POA: Diagnosis not present

## 2020-08-20 DIAGNOSIS — I872 Venous insufficiency (chronic) (peripheral): Secondary | ICD-10-CM | POA: Diagnosis not present

## 2020-08-20 DIAGNOSIS — L89322 Pressure ulcer of left buttock, stage 2: Secondary | ICD-10-CM | POA: Diagnosis not present

## 2020-08-20 DIAGNOSIS — J9622 Acute and chronic respiratory failure with hypercapnia: Secondary | ICD-10-CM | POA: Diagnosis not present

## 2020-08-20 DIAGNOSIS — I130 Hypertensive heart and chronic kidney disease with heart failure and stage 1 through stage 4 chronic kidney disease, or unspecified chronic kidney disease: Secondary | ICD-10-CM | POA: Diagnosis not present

## 2020-08-20 DIAGNOSIS — L97811 Non-pressure chronic ulcer of other part of right lower leg limited to breakdown of skin: Secondary | ICD-10-CM | POA: Diagnosis not present

## 2020-08-20 DIAGNOSIS — L97821 Non-pressure chronic ulcer of other part of left lower leg limited to breakdown of skin: Secondary | ICD-10-CM | POA: Diagnosis not present

## 2020-08-20 DIAGNOSIS — J1282 Pneumonia due to coronavirus disease 2019: Secondary | ICD-10-CM | POA: Diagnosis not present

## 2020-08-21 DIAGNOSIS — I130 Hypertensive heart and chronic kidney disease with heart failure and stage 1 through stage 4 chronic kidney disease, or unspecified chronic kidney disease: Secondary | ICD-10-CM | POA: Diagnosis not present

## 2020-08-21 DIAGNOSIS — L97811 Non-pressure chronic ulcer of other part of right lower leg limited to breakdown of skin: Secondary | ICD-10-CM | POA: Diagnosis not present

## 2020-08-21 DIAGNOSIS — J9622 Acute and chronic respiratory failure with hypercapnia: Secondary | ICD-10-CM | POA: Diagnosis not present

## 2020-08-21 DIAGNOSIS — L89322 Pressure ulcer of left buttock, stage 2: Secondary | ICD-10-CM | POA: Diagnosis not present

## 2020-08-21 DIAGNOSIS — U071 COVID-19: Secondary | ICD-10-CM | POA: Diagnosis not present

## 2020-08-21 DIAGNOSIS — L97821 Non-pressure chronic ulcer of other part of left lower leg limited to breakdown of skin: Secondary | ICD-10-CM | POA: Diagnosis not present

## 2020-08-21 DIAGNOSIS — I872 Venous insufficiency (chronic) (peripheral): Secondary | ICD-10-CM | POA: Diagnosis not present

## 2020-08-21 DIAGNOSIS — J9621 Acute and chronic respiratory failure with hypoxia: Secondary | ICD-10-CM | POA: Diagnosis not present

## 2020-08-21 DIAGNOSIS — J1282 Pneumonia due to coronavirus disease 2019: Secondary | ICD-10-CM | POA: Diagnosis not present

## 2020-08-23 DIAGNOSIS — L97821 Non-pressure chronic ulcer of other part of left lower leg limited to breakdown of skin: Secondary | ICD-10-CM | POA: Diagnosis not present

## 2020-08-23 DIAGNOSIS — J9621 Acute and chronic respiratory failure with hypoxia: Secondary | ICD-10-CM | POA: Diagnosis not present

## 2020-08-23 DIAGNOSIS — I872 Venous insufficiency (chronic) (peripheral): Secondary | ICD-10-CM | POA: Diagnosis not present

## 2020-08-23 DIAGNOSIS — I130 Hypertensive heart and chronic kidney disease with heart failure and stage 1 through stage 4 chronic kidney disease, or unspecified chronic kidney disease: Secondary | ICD-10-CM | POA: Diagnosis not present

## 2020-08-23 DIAGNOSIS — L89322 Pressure ulcer of left buttock, stage 2: Secondary | ICD-10-CM | POA: Diagnosis not present

## 2020-08-23 DIAGNOSIS — J1282 Pneumonia due to coronavirus disease 2019: Secondary | ICD-10-CM | POA: Diagnosis not present

## 2020-08-23 DIAGNOSIS — U071 COVID-19: Secondary | ICD-10-CM | POA: Diagnosis not present

## 2020-08-23 DIAGNOSIS — L97811 Non-pressure chronic ulcer of other part of right lower leg limited to breakdown of skin: Secondary | ICD-10-CM | POA: Diagnosis not present

## 2020-08-23 DIAGNOSIS — J9622 Acute and chronic respiratory failure with hypercapnia: Secondary | ICD-10-CM | POA: Diagnosis not present

## 2020-08-27 DIAGNOSIS — J1282 Pneumonia due to coronavirus disease 2019: Secondary | ICD-10-CM | POA: Diagnosis not present

## 2020-08-27 DIAGNOSIS — J9621 Acute and chronic respiratory failure with hypoxia: Secondary | ICD-10-CM | POA: Diagnosis not present

## 2020-08-27 DIAGNOSIS — I130 Hypertensive heart and chronic kidney disease with heart failure and stage 1 through stage 4 chronic kidney disease, or unspecified chronic kidney disease: Secondary | ICD-10-CM | POA: Diagnosis not present

## 2020-08-27 DIAGNOSIS — J9622 Acute and chronic respiratory failure with hypercapnia: Secondary | ICD-10-CM | POA: Diagnosis not present

## 2020-08-27 DIAGNOSIS — L89322 Pressure ulcer of left buttock, stage 2: Secondary | ICD-10-CM | POA: Diagnosis not present

## 2020-08-27 DIAGNOSIS — L97811 Non-pressure chronic ulcer of other part of right lower leg limited to breakdown of skin: Secondary | ICD-10-CM | POA: Diagnosis not present

## 2020-08-27 DIAGNOSIS — L97821 Non-pressure chronic ulcer of other part of left lower leg limited to breakdown of skin: Secondary | ICD-10-CM | POA: Diagnosis not present

## 2020-08-27 DIAGNOSIS — U071 COVID-19: Secondary | ICD-10-CM | POA: Diagnosis not present

## 2020-08-27 DIAGNOSIS — I872 Venous insufficiency (chronic) (peripheral): Secondary | ICD-10-CM | POA: Diagnosis not present

## 2020-08-28 DIAGNOSIS — J1282 Pneumonia due to coronavirus disease 2019: Secondary | ICD-10-CM | POA: Diagnosis not present

## 2020-08-28 DIAGNOSIS — L97811 Non-pressure chronic ulcer of other part of right lower leg limited to breakdown of skin: Secondary | ICD-10-CM | POA: Diagnosis not present

## 2020-08-28 DIAGNOSIS — I130 Hypertensive heart and chronic kidney disease with heart failure and stage 1 through stage 4 chronic kidney disease, or unspecified chronic kidney disease: Secondary | ICD-10-CM | POA: Diagnosis not present

## 2020-08-28 DIAGNOSIS — J9622 Acute and chronic respiratory failure with hypercapnia: Secondary | ICD-10-CM | POA: Diagnosis not present

## 2020-08-28 DIAGNOSIS — J9621 Acute and chronic respiratory failure with hypoxia: Secondary | ICD-10-CM | POA: Diagnosis not present

## 2020-08-28 DIAGNOSIS — U071 COVID-19: Secondary | ICD-10-CM | POA: Diagnosis not present

## 2020-08-28 DIAGNOSIS — L89322 Pressure ulcer of left buttock, stage 2: Secondary | ICD-10-CM | POA: Diagnosis not present

## 2020-08-28 DIAGNOSIS — I872 Venous insufficiency (chronic) (peripheral): Secondary | ICD-10-CM | POA: Diagnosis not present

## 2020-08-28 DIAGNOSIS — L97821 Non-pressure chronic ulcer of other part of left lower leg limited to breakdown of skin: Secondary | ICD-10-CM | POA: Diagnosis not present

## 2020-08-29 DIAGNOSIS — L89322 Pressure ulcer of left buttock, stage 2: Secondary | ICD-10-CM | POA: Diagnosis not present

## 2020-08-29 DIAGNOSIS — I872 Venous insufficiency (chronic) (peripheral): Secondary | ICD-10-CM | POA: Diagnosis not present

## 2020-08-29 DIAGNOSIS — J9622 Acute and chronic respiratory failure with hypercapnia: Secondary | ICD-10-CM | POA: Diagnosis not present

## 2020-08-29 DIAGNOSIS — U071 COVID-19: Secondary | ICD-10-CM | POA: Diagnosis not present

## 2020-08-29 DIAGNOSIS — I130 Hypertensive heart and chronic kidney disease with heart failure and stage 1 through stage 4 chronic kidney disease, or unspecified chronic kidney disease: Secondary | ICD-10-CM | POA: Diagnosis not present

## 2020-08-29 DIAGNOSIS — L97811 Non-pressure chronic ulcer of other part of right lower leg limited to breakdown of skin: Secondary | ICD-10-CM | POA: Diagnosis not present

## 2020-08-29 DIAGNOSIS — L97821 Non-pressure chronic ulcer of other part of left lower leg limited to breakdown of skin: Secondary | ICD-10-CM | POA: Diagnosis not present

## 2020-08-29 DIAGNOSIS — J9621 Acute and chronic respiratory failure with hypoxia: Secondary | ICD-10-CM | POA: Diagnosis not present

## 2020-08-29 DIAGNOSIS — J1282 Pneumonia due to coronavirus disease 2019: Secondary | ICD-10-CM | POA: Diagnosis not present

## 2020-08-31 DIAGNOSIS — J9621 Acute and chronic respiratory failure with hypoxia: Secondary | ICD-10-CM | POA: Diagnosis not present

## 2020-08-31 DIAGNOSIS — L89322 Pressure ulcer of left buttock, stage 2: Secondary | ICD-10-CM | POA: Diagnosis not present

## 2020-08-31 DIAGNOSIS — U071 COVID-19: Secondary | ICD-10-CM | POA: Diagnosis not present

## 2020-08-31 DIAGNOSIS — J9622 Acute and chronic respiratory failure with hypercapnia: Secondary | ICD-10-CM | POA: Diagnosis not present

## 2020-08-31 DIAGNOSIS — L97811 Non-pressure chronic ulcer of other part of right lower leg limited to breakdown of skin: Secondary | ICD-10-CM | POA: Diagnosis not present

## 2020-08-31 DIAGNOSIS — I130 Hypertensive heart and chronic kidney disease with heart failure and stage 1 through stage 4 chronic kidney disease, or unspecified chronic kidney disease: Secondary | ICD-10-CM | POA: Diagnosis not present

## 2020-08-31 DIAGNOSIS — J1282 Pneumonia due to coronavirus disease 2019: Secondary | ICD-10-CM | POA: Diagnosis not present

## 2020-08-31 DIAGNOSIS — L97821 Non-pressure chronic ulcer of other part of left lower leg limited to breakdown of skin: Secondary | ICD-10-CM | POA: Diagnosis not present

## 2020-08-31 DIAGNOSIS — I872 Venous insufficiency (chronic) (peripheral): Secondary | ICD-10-CM | POA: Diagnosis not present

## 2020-09-03 DIAGNOSIS — J9622 Acute and chronic respiratory failure with hypercapnia: Secondary | ICD-10-CM | POA: Diagnosis not present

## 2020-09-03 DIAGNOSIS — J9621 Acute and chronic respiratory failure with hypoxia: Secondary | ICD-10-CM | POA: Diagnosis not present

## 2020-09-03 DIAGNOSIS — I130 Hypertensive heart and chronic kidney disease with heart failure and stage 1 through stage 4 chronic kidney disease, or unspecified chronic kidney disease: Secondary | ICD-10-CM | POA: Diagnosis not present

## 2020-09-03 DIAGNOSIS — L89322 Pressure ulcer of left buttock, stage 2: Secondary | ICD-10-CM | POA: Diagnosis not present

## 2020-09-03 DIAGNOSIS — J1282 Pneumonia due to coronavirus disease 2019: Secondary | ICD-10-CM | POA: Diagnosis not present

## 2020-09-03 DIAGNOSIS — L97811 Non-pressure chronic ulcer of other part of right lower leg limited to breakdown of skin: Secondary | ICD-10-CM | POA: Diagnosis not present

## 2020-09-03 DIAGNOSIS — U071 COVID-19: Secondary | ICD-10-CM | POA: Diagnosis not present

## 2020-09-03 DIAGNOSIS — I872 Venous insufficiency (chronic) (peripheral): Secondary | ICD-10-CM | POA: Diagnosis not present

## 2020-09-03 DIAGNOSIS — L97821 Non-pressure chronic ulcer of other part of left lower leg limited to breakdown of skin: Secondary | ICD-10-CM | POA: Diagnosis not present

## 2020-09-04 ENCOUNTER — Telehealth: Payer: Self-pay

## 2020-09-04 NOTE — Telephone Encounter (Signed)
RTC to [place] w/ [facility], she states Skilled nursing Visits have dropped down to 1X/week.  She is requesting that visits remain at 2X/week for 4 more weeks.  VO given SChaplin, RN,BSN

## 2020-09-04 NOTE — Telephone Encounter (Signed)
Pls contact [place] for VO [phone]

## 2020-09-06 DIAGNOSIS — I872 Venous insufficiency (chronic) (peripheral): Secondary | ICD-10-CM | POA: Diagnosis not present

## 2020-09-06 DIAGNOSIS — J9622 Acute and chronic respiratory failure with hypercapnia: Secondary | ICD-10-CM | POA: Diagnosis not present

## 2020-09-06 DIAGNOSIS — J1282 Pneumonia due to coronavirus disease 2019: Secondary | ICD-10-CM | POA: Diagnosis not present

## 2020-09-06 DIAGNOSIS — I130 Hypertensive heart and chronic kidney disease with heart failure and stage 1 through stage 4 chronic kidney disease, or unspecified chronic kidney disease: Secondary | ICD-10-CM | POA: Diagnosis not present

## 2020-09-06 DIAGNOSIS — J9621 Acute and chronic respiratory failure with hypoxia: Secondary | ICD-10-CM | POA: Diagnosis not present

## 2020-09-06 DIAGNOSIS — L97821 Non-pressure chronic ulcer of other part of left lower leg limited to breakdown of skin: Secondary | ICD-10-CM | POA: Diagnosis not present

## 2020-09-06 DIAGNOSIS — U071 COVID-19: Secondary | ICD-10-CM | POA: Diagnosis not present

## 2020-09-06 DIAGNOSIS — L97811 Non-pressure chronic ulcer of other part of right lower leg limited to breakdown of skin: Secondary | ICD-10-CM | POA: Diagnosis not present

## 2020-09-06 DIAGNOSIS — L89322 Pressure ulcer of left buttock, stage 2: Secondary | ICD-10-CM | POA: Diagnosis not present

## 2020-09-10 DIAGNOSIS — U071 COVID-19: Secondary | ICD-10-CM | POA: Diagnosis not present

## 2020-09-10 DIAGNOSIS — L97821 Non-pressure chronic ulcer of other part of left lower leg limited to breakdown of skin: Secondary | ICD-10-CM | POA: Diagnosis not present

## 2020-09-10 DIAGNOSIS — I130 Hypertensive heart and chronic kidney disease with heart failure and stage 1 through stage 4 chronic kidney disease, or unspecified chronic kidney disease: Secondary | ICD-10-CM | POA: Diagnosis not present

## 2020-09-10 DIAGNOSIS — J9621 Acute and chronic respiratory failure with hypoxia: Secondary | ICD-10-CM | POA: Diagnosis not present

## 2020-09-10 DIAGNOSIS — J1282 Pneumonia due to coronavirus disease 2019: Secondary | ICD-10-CM | POA: Diagnosis not present

## 2020-09-10 DIAGNOSIS — J9622 Acute and chronic respiratory failure with hypercapnia: Secondary | ICD-10-CM | POA: Diagnosis not present

## 2020-09-10 DIAGNOSIS — I872 Venous insufficiency (chronic) (peripheral): Secondary | ICD-10-CM | POA: Diagnosis not present

## 2020-09-10 DIAGNOSIS — L97811 Non-pressure chronic ulcer of other part of right lower leg limited to breakdown of skin: Secondary | ICD-10-CM | POA: Diagnosis not present

## 2020-09-10 DIAGNOSIS — L89322 Pressure ulcer of left buttock, stage 2: Secondary | ICD-10-CM | POA: Diagnosis not present

## 2020-09-11 DIAGNOSIS — R269 Unspecified abnormalities of gait and mobility: Secondary | ICD-10-CM | POA: Diagnosis not present

## 2020-09-11 DIAGNOSIS — I5033 Acute on chronic diastolic (congestive) heart failure: Secondary | ICD-10-CM | POA: Diagnosis not present

## 2020-09-11 DIAGNOSIS — E662 Morbid (severe) obesity with alveolar hypoventilation: Secondary | ICD-10-CM | POA: Diagnosis not present

## 2020-09-11 DIAGNOSIS — J9611 Chronic respiratory failure with hypoxia: Secondary | ICD-10-CM | POA: Diagnosis not present

## 2020-09-12 DIAGNOSIS — L97811 Non-pressure chronic ulcer of other part of right lower leg limited to breakdown of skin: Secondary | ICD-10-CM | POA: Diagnosis not present

## 2020-09-12 DIAGNOSIS — L97821 Non-pressure chronic ulcer of other part of left lower leg limited to breakdown of skin: Secondary | ICD-10-CM | POA: Diagnosis not present

## 2020-09-12 DIAGNOSIS — J1282 Pneumonia due to coronavirus disease 2019: Secondary | ICD-10-CM | POA: Diagnosis not present

## 2020-09-12 DIAGNOSIS — J9622 Acute and chronic respiratory failure with hypercapnia: Secondary | ICD-10-CM | POA: Diagnosis not present

## 2020-09-12 DIAGNOSIS — L89322 Pressure ulcer of left buttock, stage 2: Secondary | ICD-10-CM | POA: Diagnosis not present

## 2020-09-12 DIAGNOSIS — I130 Hypertensive heart and chronic kidney disease with heart failure and stage 1 through stage 4 chronic kidney disease, or unspecified chronic kidney disease: Secondary | ICD-10-CM | POA: Diagnosis not present

## 2020-09-12 DIAGNOSIS — I872 Venous insufficiency (chronic) (peripheral): Secondary | ICD-10-CM | POA: Diagnosis not present

## 2020-09-12 DIAGNOSIS — U071 COVID-19: Secondary | ICD-10-CM | POA: Diagnosis not present

## 2020-09-12 DIAGNOSIS — J9621 Acute and chronic respiratory failure with hypoxia: Secondary | ICD-10-CM | POA: Diagnosis not present

## 2020-09-13 DIAGNOSIS — I130 Hypertensive heart and chronic kidney disease with heart failure and stage 1 through stage 4 chronic kidney disease, or unspecified chronic kidney disease: Secondary | ICD-10-CM | POA: Diagnosis not present

## 2020-09-13 DIAGNOSIS — I872 Venous insufficiency (chronic) (peripheral): Secondary | ICD-10-CM | POA: Diagnosis not present

## 2020-09-13 DIAGNOSIS — J9622 Acute and chronic respiratory failure with hypercapnia: Secondary | ICD-10-CM | POA: Diagnosis not present

## 2020-09-13 DIAGNOSIS — J1282 Pneumonia due to coronavirus disease 2019: Secondary | ICD-10-CM | POA: Diagnosis not present

## 2020-09-13 DIAGNOSIS — L97821 Non-pressure chronic ulcer of other part of left lower leg limited to breakdown of skin: Secondary | ICD-10-CM | POA: Diagnosis not present

## 2020-09-13 DIAGNOSIS — L89322 Pressure ulcer of left buttock, stage 2: Secondary | ICD-10-CM | POA: Diagnosis not present

## 2020-09-13 DIAGNOSIS — J9621 Acute and chronic respiratory failure with hypoxia: Secondary | ICD-10-CM | POA: Diagnosis not present

## 2020-09-13 DIAGNOSIS — L97811 Non-pressure chronic ulcer of other part of right lower leg limited to breakdown of skin: Secondary | ICD-10-CM | POA: Diagnosis not present

## 2020-09-13 DIAGNOSIS — U071 COVID-19: Secondary | ICD-10-CM | POA: Diagnosis not present

## 2020-09-15 DIAGNOSIS — L97811 Non-pressure chronic ulcer of other part of right lower leg limited to breakdown of skin: Secondary | ICD-10-CM | POA: Diagnosis not present

## 2020-09-15 DIAGNOSIS — J9621 Acute and chronic respiratory failure with hypoxia: Secondary | ICD-10-CM | POA: Diagnosis not present

## 2020-09-15 DIAGNOSIS — J1282 Pneumonia due to coronavirus disease 2019: Secondary | ICD-10-CM | POA: Diagnosis not present

## 2020-09-15 DIAGNOSIS — L89322 Pressure ulcer of left buttock, stage 2: Secondary | ICD-10-CM | POA: Diagnosis not present

## 2020-09-15 DIAGNOSIS — U071 COVID-19: Secondary | ICD-10-CM | POA: Diagnosis not present

## 2020-09-15 DIAGNOSIS — I872 Venous insufficiency (chronic) (peripheral): Secondary | ICD-10-CM | POA: Diagnosis not present

## 2020-09-15 DIAGNOSIS — J9622 Acute and chronic respiratory failure with hypercapnia: Secondary | ICD-10-CM | POA: Diagnosis not present

## 2020-09-15 DIAGNOSIS — L97821 Non-pressure chronic ulcer of other part of left lower leg limited to breakdown of skin: Secondary | ICD-10-CM | POA: Diagnosis not present

## 2020-09-15 DIAGNOSIS — I130 Hypertensive heart and chronic kidney disease with heart failure and stage 1 through stage 4 chronic kidney disease, or unspecified chronic kidney disease: Secondary | ICD-10-CM | POA: Diagnosis not present

## 2020-09-17 DIAGNOSIS — J1282 Pneumonia due to coronavirus disease 2019: Secondary | ICD-10-CM | POA: Diagnosis not present

## 2020-09-17 DIAGNOSIS — L97821 Non-pressure chronic ulcer of other part of left lower leg limited to breakdown of skin: Secondary | ICD-10-CM | POA: Diagnosis not present

## 2020-09-17 DIAGNOSIS — I872 Venous insufficiency (chronic) (peripheral): Secondary | ICD-10-CM | POA: Diagnosis not present

## 2020-09-17 DIAGNOSIS — U071 COVID-19: Secondary | ICD-10-CM | POA: Diagnosis not present

## 2020-09-17 DIAGNOSIS — J9622 Acute and chronic respiratory failure with hypercapnia: Secondary | ICD-10-CM | POA: Diagnosis not present

## 2020-09-17 DIAGNOSIS — L97811 Non-pressure chronic ulcer of other part of right lower leg limited to breakdown of skin: Secondary | ICD-10-CM | POA: Diagnosis not present

## 2020-09-17 DIAGNOSIS — I130 Hypertensive heart and chronic kidney disease with heart failure and stage 1 through stage 4 chronic kidney disease, or unspecified chronic kidney disease: Secondary | ICD-10-CM | POA: Diagnosis not present

## 2020-09-17 DIAGNOSIS — J9621 Acute and chronic respiratory failure with hypoxia: Secondary | ICD-10-CM | POA: Diagnosis not present

## 2020-09-17 DIAGNOSIS — L89322 Pressure ulcer of left buttock, stage 2: Secondary | ICD-10-CM | POA: Diagnosis not present

## 2020-09-18 DIAGNOSIS — E662 Morbid (severe) obesity with alveolar hypoventilation: Secondary | ICD-10-CM | POA: Diagnosis not present

## 2020-09-18 DIAGNOSIS — J9610 Chronic respiratory failure, unspecified whether with hypoxia or hypercapnia: Secondary | ICD-10-CM | POA: Diagnosis not present

## 2020-09-18 DIAGNOSIS — I5033 Acute on chronic diastolic (congestive) heart failure: Secondary | ICD-10-CM | POA: Diagnosis not present

## 2020-09-18 DIAGNOSIS — J9611 Chronic respiratory failure with hypoxia: Secondary | ICD-10-CM | POA: Diagnosis not present

## 2020-09-18 DIAGNOSIS — R269 Unspecified abnormalities of gait and mobility: Secondary | ICD-10-CM | POA: Diagnosis not present

## 2020-09-19 DIAGNOSIS — J9621 Acute and chronic respiratory failure with hypoxia: Secondary | ICD-10-CM | POA: Diagnosis not present

## 2020-09-19 DIAGNOSIS — I130 Hypertensive heart and chronic kidney disease with heart failure and stage 1 through stage 4 chronic kidney disease, or unspecified chronic kidney disease: Secondary | ICD-10-CM | POA: Diagnosis not present

## 2020-09-19 DIAGNOSIS — U071 COVID-19: Secondary | ICD-10-CM | POA: Diagnosis not present

## 2020-09-19 DIAGNOSIS — J9622 Acute and chronic respiratory failure with hypercapnia: Secondary | ICD-10-CM | POA: Diagnosis not present

## 2020-09-19 DIAGNOSIS — L97811 Non-pressure chronic ulcer of other part of right lower leg limited to breakdown of skin: Secondary | ICD-10-CM | POA: Diagnosis not present

## 2020-09-19 DIAGNOSIS — I872 Venous insufficiency (chronic) (peripheral): Secondary | ICD-10-CM | POA: Diagnosis not present

## 2020-09-19 DIAGNOSIS — L97821 Non-pressure chronic ulcer of other part of left lower leg limited to breakdown of skin: Secondary | ICD-10-CM | POA: Diagnosis not present

## 2020-09-19 DIAGNOSIS — L89322 Pressure ulcer of left buttock, stage 2: Secondary | ICD-10-CM | POA: Diagnosis not present

## 2020-09-19 DIAGNOSIS — J1282 Pneumonia due to coronavirus disease 2019: Secondary | ICD-10-CM | POA: Diagnosis not present

## 2020-09-20 DIAGNOSIS — I872 Venous insufficiency (chronic) (peripheral): Secondary | ICD-10-CM | POA: Diagnosis not present

## 2020-09-20 DIAGNOSIS — L97811 Non-pressure chronic ulcer of other part of right lower leg limited to breakdown of skin: Secondary | ICD-10-CM | POA: Diagnosis not present

## 2020-09-20 DIAGNOSIS — J9621 Acute and chronic respiratory failure with hypoxia: Secondary | ICD-10-CM | POA: Diagnosis not present

## 2020-09-20 DIAGNOSIS — J1282 Pneumonia due to coronavirus disease 2019: Secondary | ICD-10-CM | POA: Diagnosis not present

## 2020-09-20 DIAGNOSIS — L97821 Non-pressure chronic ulcer of other part of left lower leg limited to breakdown of skin: Secondary | ICD-10-CM | POA: Diagnosis not present

## 2020-09-20 DIAGNOSIS — L89322 Pressure ulcer of left buttock, stage 2: Secondary | ICD-10-CM | POA: Diagnosis not present

## 2020-09-20 DIAGNOSIS — U071 COVID-19: Secondary | ICD-10-CM | POA: Diagnosis not present

## 2020-09-20 DIAGNOSIS — J9622 Acute and chronic respiratory failure with hypercapnia: Secondary | ICD-10-CM | POA: Diagnosis not present

## 2020-09-20 DIAGNOSIS — I130 Hypertensive heart and chronic kidney disease with heart failure and stage 1 through stage 4 chronic kidney disease, or unspecified chronic kidney disease: Secondary | ICD-10-CM | POA: Diagnosis not present

## 2020-09-24 DIAGNOSIS — I872 Venous insufficiency (chronic) (peripheral): Secondary | ICD-10-CM | POA: Diagnosis not present

## 2020-09-24 DIAGNOSIS — U071 COVID-19: Secondary | ICD-10-CM | POA: Diagnosis not present

## 2020-09-24 DIAGNOSIS — L97821 Non-pressure chronic ulcer of other part of left lower leg limited to breakdown of skin: Secondary | ICD-10-CM | POA: Diagnosis not present

## 2020-09-24 DIAGNOSIS — J1282 Pneumonia due to coronavirus disease 2019: Secondary | ICD-10-CM | POA: Diagnosis not present

## 2020-09-24 DIAGNOSIS — J9622 Acute and chronic respiratory failure with hypercapnia: Secondary | ICD-10-CM | POA: Diagnosis not present

## 2020-09-24 DIAGNOSIS — I130 Hypertensive heart and chronic kidney disease with heart failure and stage 1 through stage 4 chronic kidney disease, or unspecified chronic kidney disease: Secondary | ICD-10-CM | POA: Diagnosis not present

## 2020-09-24 DIAGNOSIS — J9621 Acute and chronic respiratory failure with hypoxia: Secondary | ICD-10-CM | POA: Diagnosis not present

## 2020-09-24 DIAGNOSIS — L97811 Non-pressure chronic ulcer of other part of right lower leg limited to breakdown of skin: Secondary | ICD-10-CM | POA: Diagnosis not present

## 2020-09-24 DIAGNOSIS — L89322 Pressure ulcer of left buttock, stage 2: Secondary | ICD-10-CM | POA: Diagnosis not present

## 2020-09-25 ENCOUNTER — Telehealth: Payer: Self-pay | Admitting: *Deleted

## 2020-09-25 NOTE — Telephone Encounter (Signed)
Call from [place], [place] at [facility] - stated she will be refaxing orders on this pt which has to be completed/signed by the Attending, not the resident. Thanks

## 2020-09-25 NOTE — Telephone Encounter (Signed)
One of [place] will fill it out when it comes through.  Thanks

## 2020-09-26 ENCOUNTER — Emergency Department (HOSPITAL_COMMUNITY): Payer: Medicare PPO

## 2020-09-26 ENCOUNTER — Inpatient Hospital Stay (HOSPITAL_COMMUNITY)
Admission: EM | Admit: 2020-09-26 | Discharge: 2020-09-30 | DRG: 602 | Disposition: A | Discharge status: Home Health | Payer: Medicare PPO | Attending: Internal Medicine | Admitting: Internal Medicine

## 2020-09-26 ENCOUNTER — Other Ambulatory Visit: Payer: Self-pay

## 2020-09-26 DIAGNOSIS — J9622 Acute and chronic respiratory failure with hypercapnia: Secondary | ICD-10-CM | POA: Diagnosis present

## 2020-09-26 DIAGNOSIS — J9621 Acute and chronic respiratory failure with hypoxia: Secondary | ICD-10-CM | POA: Diagnosis present

## 2020-09-26 DIAGNOSIS — K219 Gastro-esophageal reflux disease without esophagitis: Secondary | ICD-10-CM | POA: Diagnosis present

## 2020-09-26 DIAGNOSIS — M25569 Pain in unspecified knee: Secondary | ICD-10-CM | POA: Diagnosis present

## 2020-09-26 DIAGNOSIS — J9811 Atelectasis: Secondary | ICD-10-CM | POA: Diagnosis not present

## 2020-09-26 DIAGNOSIS — M549 Dorsalgia, unspecified: Secondary | ICD-10-CM | POA: Diagnosis present

## 2020-09-26 DIAGNOSIS — G8929 Other chronic pain: Secondary | ICD-10-CM | POA: Diagnosis present

## 2020-09-26 DIAGNOSIS — Z20822 Contact with and (suspected) exposure to covid-19: Secondary | ICD-10-CM | POA: Diagnosis present

## 2020-09-26 DIAGNOSIS — E872 Acidosis: Secondary | ICD-10-CM | POA: Diagnosis not present

## 2020-09-26 DIAGNOSIS — Z87442 Personal history of urinary calculi: Secondary | ICD-10-CM

## 2020-09-26 DIAGNOSIS — R6520 Severe sepsis without septic shock: Secondary | ICD-10-CM | POA: Diagnosis not present

## 2020-09-26 DIAGNOSIS — R0602 Shortness of breath: Secondary | ICD-10-CM | POA: Diagnosis not present

## 2020-09-26 DIAGNOSIS — Z6838 Body mass index (BMI) 38.0-38.9, adult: Secondary | ICD-10-CM | POA: Diagnosis not present

## 2020-09-26 DIAGNOSIS — D519 Vitamin B12 deficiency anemia, unspecified: Secondary | ICD-10-CM | POA: Diagnosis present

## 2020-09-26 DIAGNOSIS — I130 Hypertensive heart and chronic kidney disease with heart failure and stage 1 through stage 4 chronic kidney disease, or unspecified chronic kidney disease: Secondary | ICD-10-CM | POA: Diagnosis present

## 2020-09-26 DIAGNOSIS — L03115 Cellulitis of right lower limb: Secondary | ICD-10-CM | POA: Diagnosis present

## 2020-09-26 DIAGNOSIS — L03114 Cellulitis of left upper limb: Secondary | ICD-10-CM | POA: Diagnosis not present

## 2020-09-26 DIAGNOSIS — I517 Cardiomegaly: Secondary | ICD-10-CM | POA: Diagnosis not present

## 2020-09-26 DIAGNOSIS — J90 Pleural effusion, not elsewhere classified: Secondary | ICD-10-CM | POA: Diagnosis not present

## 2020-09-26 DIAGNOSIS — J9601 Acute respiratory failure with hypoxia: Secondary | ICD-10-CM

## 2020-09-26 DIAGNOSIS — M2550 Pain in unspecified joint: Secondary | ICD-10-CM | POA: Diagnosis not present

## 2020-09-26 DIAGNOSIS — Z87891 Personal history of nicotine dependence: Secondary | ICD-10-CM

## 2020-09-26 DIAGNOSIS — I700 Atherosclerosis of aorta: Secondary | ICD-10-CM | POA: Diagnosis not present

## 2020-09-26 DIAGNOSIS — U071 COVID-19: Secondary | ICD-10-CM | POA: Diagnosis not present

## 2020-09-26 DIAGNOSIS — I2699 Other pulmonary embolism without acute cor pulmonale: Secondary | ICD-10-CM | POA: Diagnosis not present

## 2020-09-26 DIAGNOSIS — Z8616 Personal history of COVID-19: Secondary | ICD-10-CM

## 2020-09-26 DIAGNOSIS — E662 Morbid (severe) obesity with alveolar hypoventilation: Secondary | ICD-10-CM | POA: Diagnosis not present

## 2020-09-26 DIAGNOSIS — F32A Depression, unspecified: Secondary | ICD-10-CM | POA: Diagnosis present

## 2020-09-26 DIAGNOSIS — M810 Age-related osteoporosis without current pathological fracture: Secondary | ICD-10-CM | POA: Diagnosis present

## 2020-09-26 DIAGNOSIS — L97811 Non-pressure chronic ulcer of other part of right lower leg limited to breakdown of skin: Secondary | ICD-10-CM | POA: Diagnosis not present

## 2020-09-26 DIAGNOSIS — I5032 Chronic diastolic (congestive) heart failure: Secondary | ICD-10-CM | POA: Diagnosis not present

## 2020-09-26 DIAGNOSIS — I872 Venous insufficiency (chronic) (peripheral): Secondary | ICD-10-CM | POA: Diagnosis not present

## 2020-09-26 DIAGNOSIS — R4182 Altered mental status, unspecified: Secondary | ICD-10-CM | POA: Diagnosis not present

## 2020-09-26 DIAGNOSIS — Z8249 Family history of ischemic heart disease and other diseases of the circulatory system: Secondary | ICD-10-CM | POA: Diagnosis not present

## 2020-09-26 DIAGNOSIS — N189 Chronic kidney disease, unspecified: Secondary | ICD-10-CM | POA: Diagnosis present

## 2020-09-26 DIAGNOSIS — R5381 Other malaise: Secondary | ICD-10-CM | POA: Diagnosis not present

## 2020-09-26 DIAGNOSIS — J9690 Respiratory failure, unspecified, unspecified whether with hypoxia or hypercapnia: Secondary | ICD-10-CM | POA: Diagnosis not present

## 2020-09-26 DIAGNOSIS — J1282 Pneumonia due to coronavirus disease 2019: Secondary | ICD-10-CM | POA: Diagnosis not present

## 2020-09-26 DIAGNOSIS — A419 Sepsis, unspecified organism: Secondary | ICD-10-CM | POA: Diagnosis present

## 2020-09-26 DIAGNOSIS — L03116 Cellulitis of left lower limb: Principal | ICD-10-CM | POA: Diagnosis present

## 2020-09-26 DIAGNOSIS — L97821 Non-pressure chronic ulcer of other part of left lower leg limited to breakdown of skin: Secondary | ICD-10-CM | POA: Diagnosis not present

## 2020-09-26 DIAGNOSIS — I509 Heart failure, unspecified: Secondary | ICD-10-CM | POA: Diagnosis not present

## 2020-09-26 DIAGNOSIS — I10 Essential (primary) hypertension: Secondary | ICD-10-CM | POA: Diagnosis not present

## 2020-09-26 DIAGNOSIS — Z7401 Bed confinement status: Secondary | ICD-10-CM | POA: Diagnosis not present

## 2020-09-26 DIAGNOSIS — L89322 Pressure ulcer of left buttock, stage 2: Secondary | ICD-10-CM | POA: Diagnosis not present

## 2020-09-26 LAB — URINALYSIS, ROUTINE W REFLEX MICROSCOPIC
Bilirubin Urine: NEGATIVE
Glucose, UA: NEGATIVE mg/dL
Hgb urine dipstick: NEGATIVE
Ketones, ur: NEGATIVE mg/dL
Leukocytes,Ua: NEGATIVE
Nitrite: NEGATIVE
Protein, ur: NEGATIVE mg/dL
Specific Gravity, Urine: 1.019 (ref 1.005–1.030)
pH: 5 (ref 5.0–8.0)

## 2020-09-26 LAB — BASIC METABOLIC PANEL
Anion gap: 12 (ref 5–15)
BUN: 22 mg/dL (ref 8–23)
CO2: 29 mmol/L (ref 22–32)
Calcium: 9.3 mg/dL (ref 8.9–10.3)
Chloride: 96 mmol/L — ABNORMAL LOW (ref 98–111)
Creatinine, Ser: 0.76 mg/dL (ref 0.44–1.00)
GFR, Estimated: >60 mL/min (ref >60)
Glucose, Bld: 190 mg/dL — ABNORMAL HIGH (ref 70–99)
Potassium: 3.1 mmol/L — ABNORMAL LOW (ref 3.5–5.1)
Sodium: 137 mmol/L (ref 135–145)

## 2020-09-26 LAB — CBC
HCT: 35.5 % — ABNORMAL LOW (ref 36.0–46.0)
HCT: 32.8 % — ABNORMAL LOW (ref 36.0–46.0)
Hemoglobin: 11.1 g/dL — ABNORMAL LOW (ref 12.0–15.0)
Hemoglobin: 10.4 g/dL — ABNORMAL LOW (ref 12.0–15.0)
MCH: 28 pg (ref 26.0–34.0)
MCH: 28.4 pg (ref 26.0–34.0)
MCHC: 31.3 g/dL (ref 30.0–36.0)
MCHC: 31.7 g/dL (ref 30.0–36.0)
MCV: 89.4 fL (ref 80.0–100.0)
MCV: 89.6 fL (ref 80.0–100.0)
Platelets: 129 10*3/uL — ABNORMAL LOW (ref 150–400)
Platelets: 128 10*3/uL — ABNORMAL LOW (ref 150–400)
RBC: 3.97 MIL/uL (ref 3.87–5.11)
RBC: 3.66 MIL/uL — ABNORMAL LOW (ref 3.87–5.11)
RDW: 15.2 % (ref 11.5–15.5)
RDW: 15.4 % (ref 11.5–15.5)
WBC: 23.1 10*3/uL — ABNORMAL HIGH (ref 4.0–10.5)
WBC: 28.5 10*3/uL — ABNORMAL HIGH (ref 4.0–10.5)
nRBC: 0 % (ref 0.0–0.2)
nRBC: 0 % (ref 0.0–0.2)

## 2020-09-26 LAB — BRAIN NATRIURETIC PEPTIDE: B Natriuretic Peptide: 51 pg/mL (ref 0.0–100.0)

## 2020-09-26 LAB — HEPATIC FUNCTION PANEL
ALT: 22 U/L (ref 0–44)
AST: 24 U/L (ref 15–41)
Albumin: 3 g/dL — ABNORMAL LOW (ref 3.5–5.0)
Alkaline Phosphatase: 114 U/L (ref 38–126)
Bilirubin, Direct: 0.3 mg/dL — ABNORMAL HIGH (ref 0.0–0.2)
Indirect Bilirubin: 0.6 mg/dL (ref 0.3–0.9)
Total Bilirubin: 0.9 mg/dL (ref 0.3–1.2)
Total Protein: 6.4 g/dL — ABNORMAL LOW (ref 6.5–8.1)

## 2020-09-26 LAB — DIFFERENTIAL
Abs Immature Granulocytes: 0 10*3/uL (ref 0.00–0.07)
Basophils Absolute: 0 10*3/uL (ref 0.0–0.1)
Basophils Relative: 0 %
Eosinophils Absolute: 0 10*3/uL (ref 0.0–0.5)
Eosinophils Relative: 0 %
Lymphocytes Relative: 2 %
Lymphs Abs: 0.6 10*3/uL — ABNORMAL LOW (ref 0.7–4.0)
Monocytes Absolute: 0.6 10*3/uL (ref 0.1–1.0)
Monocytes Relative: 2 %
Neutro Abs: 27.4 10*3/uL — ABNORMAL HIGH (ref 1.7–7.7)
Neutrophils Relative %: 96 %
nRBC: 0 /100 WBC

## 2020-09-26 LAB — PROTIME-INR
INR: 1.1 (ref 0.8–1.2)
Prothrombin Time: 13.9 seconds (ref 11.4–15.2)

## 2020-09-26 LAB — POC SARS CORONAVIRUS 2 AG -  ED
SARS Coronavirus 2 Ag: NEGATIVE
SARS Coronavirus 2 Ag: NEGATIVE

## 2020-09-26 LAB — LACTIC ACID, PLASMA: Lactic Acid, Venous: 1.7 mmol/L (ref 0.5–1.9)

## 2020-09-26 LAB — APTT: aPTT: 33 seconds (ref 24–36)

## 2020-09-26 LAB — SARS CORONAVIRUS 2 (TAT 6-24 HRS): SARS Coronavirus 2: NEGATIVE

## 2020-09-26 MED ORDER — VANCOMYCIN HCL IN DEXTROSE 1-5 GM/200ML-% IV SOLN
1000.0000 mg | INTRAVENOUS | Status: DC
  Administered 2020-09-27: 1000 mg via INTRAVENOUS
  Filled 2020-09-26: qty 200

## 2020-09-26 MED ORDER — ACETAMINOPHEN 650 MG RE SUPP: 650.0000 mg | Freq: Four times a day (QID) | RECTAL | Status: DC | PRN

## 2020-09-26 MED ORDER — ENOXAPARIN SODIUM 40 MG/0.4ML ~~LOC~~ SOLN
40.0000 mg | SUBCUTANEOUS | Status: DC
  Administered 2020-09-27 – 2020-09-29 (×2): 40 mg via SUBCUTANEOUS
  Filled 2020-09-26 (×2): qty 0.4

## 2020-09-26 MED ORDER — IOHEXOL 350 MG/ML SOLN
80.0000 mL | Freq: Once | INTRAVENOUS | Status: AC | PRN
  Administered 2020-09-26: 80 mL via INTRAVENOUS

## 2020-09-26 MED ORDER — SODIUM CHLORIDE 0.9 % IV SOLN
2.0000 g | Freq: Three times a day (TID) | INTRAVENOUS | Status: DC
  Administered 2020-09-27 – 2020-09-28 (×5): 2 g via INTRAVENOUS
  Filled 2020-09-26 (×5): qty 2

## 2020-09-26 MED ORDER — VANCOMYCIN HCL IN DEXTROSE 1-5 GM/200ML-% IV SOLN
1000.0000 mg | Freq: Once | INTRAVENOUS | Status: DC
Start: 2020-09-26 — End: 2020-09-26

## 2020-09-26 MED ORDER — ACETAMINOPHEN 325 MG PO TABS
650.0000 mg | ORAL_TABLET | Freq: Four times a day (QID) | ORAL | Status: DC | PRN
  Administered 2020-09-28 – 2020-09-29 (×3): 650 mg via ORAL
  Filled 2020-09-26 (×3): qty 2

## 2020-09-26 MED ORDER — SODIUM CHLORIDE 0.9 % IV SOLN
2.0000 g | Freq: Once | INTRAVENOUS | Status: AC
  Administered 2020-09-26: 2 g via INTRAVENOUS
  Filled 2020-09-26: qty 2

## 2020-09-26 MED ORDER — METRONIDAZOLE IN NACL 5-0.79 MG/ML-% IV SOLN
500.0000 mg | Freq: Once | INTRAVENOUS | Status: AC
  Administered 2020-09-26: 500 mg via INTRAVENOUS
  Filled 2020-09-26: qty 100

## 2020-09-26 MED ORDER — LACTATED RINGERS IV SOLN: INTRAVENOUS | Status: AC

## 2020-09-26 MED ORDER — DICLOFENAC SODIUM 1 % EX GEL
4.0000 g | Freq: Four times a day (QID) | CUTANEOUS | Status: DC | PRN
  Filled 2020-09-26: qty 100

## 2020-09-26 MED ORDER — SENNOSIDES-DOCUSATE SODIUM 8.6-50 MG PO TABS: 1.0000 | ORAL_TABLET | Freq: Every evening | ORAL | Status: DC | PRN

## 2020-09-26 MED ORDER — ACETAMINOPHEN 500 MG PO TABS
1000.0000 mg | ORAL_TABLET | Freq: Once | ORAL | Status: AC
  Administered 2020-09-26: 1000 mg via ORAL
  Filled 2020-09-26: qty 2

## 2020-09-26 MED ORDER — VANCOMYCIN HCL 1500 MG/300ML IV SOLN
1500.0000 mg | Freq: Once | INTRAVENOUS | Status: AC
  Administered 2020-09-26: 1500 mg via INTRAVENOUS
  Filled 2020-09-26: qty 300

## 2020-09-26 NOTE — ED Notes (Signed)
Patient A0x4  Open wounds noted to BLE-- open, pungent, and weeping.   chux placed underneath and .left open to air

## 2020-09-26 NOTE — H&P (Signed)
Date: 09/26/2020               Patient Name:  [name] MRN: [id number]  DOB: December 21, 1950 Age / Sex: 70 y.o., female   PCP: [name], MD         Medical Service: Internal Medicine Teaching Service         Attending Physician: Dr. Daleen Bo, MD    First Contact: Dr. Carroll Sage Pager: [phone]  Second Contact: Dr. Harlow Ohms Pager: [phone]       After Hours (After 5p/  First Contact Pager: [phone]  weekends / holidays): Second Contact Pager: [phone]   Chief Complaint: altered mental status, shortness of breath  History of Present Illness:   [name] is a 70 year old woman with past medical history of chronic back pain after L1 fracture, chronic hypoxic/hypercarbic respiratory failure on 3-4 L [place], morbid obesity, HFpEF, chronic ITP, hypertension, recent COVID-19 pneumonia in October 2021 who presents after an episode of confusion and found to be febrile and dyspneic.  Patient states that she does not know why she is at the hospital. She does not recall any inciting events to bring her here. She recalls gagging and coughing earlier today before coming to the hospital. Denies belly pain, dizziness, chest pain, myalgias. She does endorse being cold and asks repeatedly for something to drink.  Reached out to patient's daughter, [name], whom the patient lives with. She reports the events of the morning are fuzzy since she had just woken up. Reports when she woke up, she noticed the living room light was uncharacteristically left on by the patient, who spends most of the day there and sleeps there at night. She went to check on the patient and noticed that her oxygen tubing was below her chin. When she asked the patient how it got there, she seemed confused and could not remember. The daughter notes the patient "looked off" but was able to carry on a conversation. The home health nurse who was at the house to change the patient's lower extremity wound dressings  checked her temperature which was 101.6. She notes the patient seemed more short of breath than usual on her 3-4 L. She called EMS who arrived around 10 am.  The daughter reports at her baseline the patient seems tired, sleeps or watches TV during the day. Does note that over the last couple of weeks the patient has been complaining more of her legs being sore and that the [facility] aids who change her dressings have noticed increased weeping. No cough or runny nose. Reports the patient manages her own medications. Moves around pretty well unassisted with a walker.    ED Course: Febrile (Tmax 39.5), tachypneic (RR 20s-30s), tachycardic (P 120s->110s), BP 100s-110s/40s-60s. Maintaining oxygen saturation >90% on home 4 L [place]. Urinalysis unremarkable. Urine culture collected. Lactic acid 1.7. CBC with leukocytosis (28.5), normocytic anemia (Hgb 10.4 with MCV 89.6), thrombocytopenia (128k). BNP 51. BMP with hypokalemia (3.1), hyperglycemia (190), BUN/Cr 22/0.76. COVID antigen test negative. CXR with mild cardiomegaly, interstitial edema, probably tiny bilateral pleural effusions, L>R base airspace disease. CTA without evidence of pulmonary embolism, with bibasilar subsegmental consolidative changes possibly atelectasis. Multilevel chornic appearing compression fractures. Given Tylenol, vancomycin, cefepime, and Flagyl, mIVF LR @ 50 cc/hr.  Meds:  No outpatient medications have been marked as taking for the 09/26/20 encounter [facility] Encounter).   Baclofen 5 mg q8h PRN Carvedilol 3.125 mg twice daily Diclofenac sodium 1% gel 4 g 4 times daily  PRN for knee pain Lidocaine 5% Furosemide (Lasix) 80 mg daily Potassium chloride 10 mEq daily Loperamide 2 mg PRN for diarrhea or loose stools Ramelteon 8 mg QHS  Social History: Lives with her daughter, [name], and son-in-law. Has a home health aide who visits 2-3 times weekly to help with dressing changes. Another aide comes once weekly to bathe her. Quit smoking in  2015, daughter is unsure of pack-year history. No alcohol use. Has trouble getting to clinic appointments because she has a hard time getting into a car, and her daughter doesn't have a license.  Family History: No known cancer history. Mother passed away from MI.   Allergies: Allergies as of 09/26/2020  . (No Known Allergies)   Past Medical History:  Diagnosis Date  . Arthritis    lumbar burst fx, osteoporosis  . Back pain 09/29/2013  . Blood dyscrasia   . Chest pain    denies  . Chronic kidney disease    passed stones spontaneously- 40 yrs. ago   . Depression    states her current situation with activity limitations, she has a low spirit at times.   Marland Kitchen Dysrhythmia    occ tachycardia  . Fracture of vertebra, compression ([place])   . GERD (gastroesophageal reflux disease)    occ  . History of kidney stones   . HTN (hypertension)   . ITP (idiopathic thrombocytopenic purpura) 08/29/2013  . Leukocytosis, unspecified 09/05/2013  . Muscle cramp 11/16/2013  . Tachycardia    occ upon exersion, and when put on bp med    Review of Systems: A complete ROS was negative except as per HPI.   Physical Exam: Blood pressure (!) 118/57, pulse (!) 111, temperature (!) 101.7 F (38.7 C), temperature source Oral, resp. rate (!) 23, height 5' (1.524 m), weight 83.9 kg, SpO2 100 %. Constitutional: ill-appearing woman lying in bed, shivering HENT: normocephalic atraumatic, mucous membranes dry, lips with dry, cracked skin Eyes: conjunctiva non-erythematous Neck: supple, no lymphadenopathy Cardiovascular: regular rate and rhythm, no m/r/g Pulmonary/Chest: tachypneic, increased work of breathing on 4 L [place], lungs clear to auscultation anteriorly; exam limited by patient's immobility in bed Abdominal: distended but soft, non-tender Neurological: alert & oriented to person, place, and time; does not recall events leading to being in the hospital; when asked to states the months of the year backwards  states, "oh I won't do that, I'm not drunk." When asked to calculate serial 7s states, "I can't do math." MSK: Decreased muscle bulk Skin: bilateral lower extremities below the knees are erythematous and warm; large scales weeping clear-yellow fluid; tender to palpation; heel of R foot more erythematous; there is onychomycosis with scaling of both feet and scaling on her forehead; decreased capillary refill in the fingertips Psych: Affect is flat. Patient responds in few-word answers. Seems frustrated with questioning. Guarded.         Labs: CBC    Component Value Date/Time   WBC 28.5 (H) 09/26/2020 1337   RBC 3.66 (L) 09/26/2020 1337   HGB 10.4 (L) 09/26/2020 1337   HGB 13.2 04/10/2014 1315   HCT 32.8 (L) 09/26/2020 1337   HCT 40.7 04/10/2014 1315   PLT 128 (L) 09/26/2020 1337   PLT 109 (L) 04/10/2014 1315   MCV 89.6 09/26/2020 1337   MCV 85.3 04/10/2014 1315   MCH 28.4 09/26/2020 1337   MCHC 31.7 09/26/2020 1337   RDW 15.4 09/26/2020 1337   RDW 15.1 (H) 04/10/2014 1315   LYMPHSABS 0.6 (L) 09/26/2020 1337  LYMPHSABS 1.4 04/10/2014 1315   MONOABS 0.6 09/26/2020 1337   MONOABS 0.7 04/10/2014 1315   EOSABS 0.0 09/26/2020 1337   EOSABS 0.6 (H) 04/10/2014 1315   BASOSABS 0.0 09/26/2020 1337   BASOSABS 0.1 04/10/2014 1315     CMP     Component Value Date/Time   NA 137 09/26/2020 1058   NA 140 10/20/2018 1415   NA 140 11/16/2013 1238   K 3.1 (L) 09/26/2020 1058   K 4.1 11/16/2013 1238   CL 96 (L) 09/26/2020 1058   CO2 29 09/26/2020 1058   CO2 24 11/16/2013 1238   GLUCOSE 190 (H) 09/26/2020 1058   GLUCOSE 114 11/16/2013 1238   BUN 22 09/26/2020 1058   BUN 20 10/20/2018 1415   BUN 12.1 11/16/2013 1238   CREATININE 0.76 09/26/2020 1058   CREATININE 0.7 11/16/2013 1238   CALCIUM 9.3 09/26/2020 1058   CALCIUM 9.7 11/16/2013 1238   PROT 6.4 (L) 09/26/2020 1337   PROT 6.6 09/29/2013 1500   ALBUMIN 3.0 (L) 09/26/2020 1337   ALBUMIN 4.2 09/29/2013 1500   AST 24  09/26/2020 1337   AST 15 09/29/2013 1500   ALT 22 09/26/2020 1337   ALT 43 09/29/2013 1500   ALKPHOS 114 09/26/2020 1337   ALKPHOS 71 09/29/2013 1500   BILITOT 0.9 09/26/2020 1337   BILITOT 1.15 09/29/2013 1500   GFRNONAA >60 09/26/2020 1058   GFRAA >60 05/17/2020 0631    Imaging: DG Chest 2 View  Result Date: 09/26/2020 CLINICAL DATA:  Shortness of breath.  CHF. EXAM: CHEST - 2 VIEW COMPARISON:  07/07/2020 FINDINGS: Artifact degradation about the upper chest. Accentuation of expected thoracic kyphosis. Borderline cardiomegaly. Probable tiny bilateral pleural effusions. No pneumothorax. Low lung volumes. Mild interstitial edema. Left greater than right base airspace disease. IMPRESSION: No significant change compared to 07/07/2020. Mild cardiomegaly and interstitial edema. Probable layering small bilateral pleural effusions with adjacent airspace disease. Most likely atelectasis. Electronically Signed   By: Abigail Miyamoto M.D.   On: 09/26/2020 11:31    EKG: personally reviewed my interpretation is sinus tachycardia  Assessment & Plan by Problem: Active Problems:   Sepsis [facility])   Ms. Zaryah Seckel is a 70 year old woman with past medical history of chronic back pain after L1 fracture, chronic hypoxic/hypercarbic respiratory failure on 3-4 L [place], morbid obesity, HFpEF, chronic ITP, hypertension, recent COVID-19 pneumonia in October 2021 who presents after an episode of confusion and dyspnea and found to be febrile, tachycardic, tachypneic, and with leukocytosis concerning for possible sepsis.  Fever, tachycardia, tachypnea, leukocytosis Possible sepsis Chronic venous stasis dermatitis Patient with an episode of confusion this morning and found to be febrile by home health aide. In the ED, febrile with Tmax 39.5, tachypneic with RR 20s-30s, tachycardic with pulse 120s on arrival improved to 110s with fluids. Normotensive with BP 100s-110s/40s-60s. Maintaining oxygen saturation >90% on home 4  L [place]. Lactic acid 1.7. Leukocytosis to 28.5. COVID antigen test negative. Exam significant for chills, weeping chronic venous stasis dermatitis. Lungs clear to auscultation. Presentation concerning for possible sepsis, though infectious source unknown at this time. Blood cultures were drawn. Patient treated with vancomycin, cefepime, and Flagyl. Gentle fluids given patient's history of HFpEF. Pulmonary etiology less likely without respiratory distress and stable CXR, though patient reported episode of gagging and coughing could suggest possible aspiration event. Urinalysis bland, culture pending. Currently highest suspicion for sepsis secondary to possible infection of patient's chronic venous stasis dermatitis which reportedly have been more weepy and painful recently.  Patient was admitted to [facility] 04/18/20-04/30/20 with sepsis 2/2 to lower extremity cellulitis and has had a home health aide tending to her legs 2-3x weekly. - Continue broad spectrum antibiotics: vancomycin, cefepime, and Flagyl - LR @ 50 cc/hr for 20 hrs - Blood cultures pending - Urine culture pending - WOCN consult - COVID-19 antigen test negative; PCR pending  Chronic hypoxic hypercapnic respiratory failure Obesity hypoventilation syndrome Patient on 3-4 L home oxygen. On 4 L here. - Continue supplemental oxygen  Chronic back and knee pain Discharged recently with lidocaine patch, voltaren gel, lower dose of baclofen PRN. - Tylenol PRN - Voltaren gel  Chronic ITP Platelet count 128k on admission. Improved from 50-60k during last admission. No signs of bleeding. - Continue to monitor   Diet: Normal VTE: Enoxaparin IVF: LR,50cc/hr Code: Full  Prior to Admission Living Arrangement: Home, living with daughter and son-in-law Anticipated Discharge Location: Pending PT evaluation  Dispo: Admit patient to Inpatient with expected length of stay greater than 2 midnights.  Signed: Alexandria Lodge, MD PGY-1 Internal Medicine  Teaching Service Pager: [phone] 09/26/2020

## 2020-09-26 NOTE — ED Notes (Signed)
Taken to CT.

## 2020-09-26 NOTE — ED Provider Notes (Signed)
[facility] EMERGENCY DEPARTMENT Provider Note   CSN: [id number] Arrival date & time: 09/26/20  1053     History Chief Complaint  Patient presents with  . Shortness of Breath    [name] is a 70 y.o. female with past medical history of COVID-19 in October 2021, CHF, chronic respiratory failure on 4 L nasal cannula, presenting to the ED from home for shortness of breath.  Level 5 caveat, patient is unable to provide history.  She answers "I do not know" to history questions.  She states she does not know why she is here or who called the ambulance.  She denies current shortness of breath or abdominal pain.  Denies any cough.  Additional history obtained from patient's daughter over the phone.  She states she has home health come to the house for wound care of her chronic lower leg wounds.  Today they were coming to bathe her.  She states this morning she was not acting her usual self.  She states her O2 [place] was under her chin instead of in her nose. Looked confused.  Confirm she is on 4 L nasal cannula chronically.  States the patient was SOB this morning but thought this was probably because she was not on O2. No fever at home.patient acting her baseline last night. No recent steroid use. No vaccines for COVID.   The history is provided by a relative. The history is limited by the condition of the patient.       Past Medical History:  Diagnosis Date  . Arthritis    lumbar burst fx, osteoporosis  . Back pain 09/29/2013  . Blood dyscrasia   . Chest pain    denies  . Chronic kidney disease    passed stones spontaneously- 40 yrs. ago   . Depression    states her current situation with activity limitations, she has a low spirit at times.   Marland Kitchen Dysrhythmia    occ tachycardia  . Fracture of vertebra, compression ([place])   . GERD (gastroesophageal reflux disease)    occ  . History of kidney stones   . HTN (hypertension)   . ITP (idiopathic thrombocytopenic  purpura) 08/29/2013  . Leukocytosis, unspecified 09/05/2013  . Muscle cramp 11/16/2013  . Tachycardia    occ upon exersion, and when put on bp med    Patient Active Problem List   Diagnosis Date Noted  . Pneumonia due to COVID-19 virus 07/04/2020  . Respiratory failure ([place]) 06/30/2020  . Respiratory failure with hypoxia ([place]) 06/29/2020  . Shock ([place]) 05/14/2020  . Chronic, continuous use of opioids 05/14/2020  . Opioid overdose ([place]) 05/14/2020  . Acute on chronic respiratory failure with hypoxia and hypercapnia ([place]) 05/14/2020  . Pressure injury of skin 04/22/2020  . Sepsis due to cellulitis ([place]) 04/18/2020  . Venous stasis dermatitis of both lower extremities 02/25/2019  . Encounter for preventive care 10/20/2018  . Chronic respiratory failure with hypoxia and hypercapnia ([place]) 10/06/2018  . Polycythemia, secondary 10/05/2018  . Obesity hypoventilation syndrome ([place]) 10/04/2018  . AKI (acute kidney injury) ([place])   . BMI 40.0-44.9, adult ([place])   . Bilateral lower extremity edema   . Venous stasis dermatitis 09/30/2018  . Wound of lower extremity, bilateral 09/30/2018  . Chronic heart failure with preserved ejection fraction with moderate RV failure 06/09/2017  . GERD (gastroesophageal reflux disease) 11/13/2014  . Opiate-induced constipation 11/13/2014  . History of tobacco abuse 03/10/2014  . Chronic back pain secondary to  traumatic L1 fracture 09/29/2013  . Chronic idiopathic thrombocytopenia ([place]) 08/29/2013  . HTN (hypertension)     Past Surgical History:  Procedure Laterality Date  . ABDOMINAL HYSTERECTOMY    . ANTERIOR LAT LUMBAR FUSION N/A 03/28/2014   Procedure: Anterolateral decompression of L1 fracture with posterior segmental stabilization;  Surgeon: Kristeen Miss, MD;  Location: [facility];  Service: Neurosurgery;  Laterality: N/A;  Anterolateral decompression of Lumbar One fracture with posterior segmental stabilization  . CESAREAN SECTION     x2  . CHEST  TUBE INSERTION Left 03/28/2014   Procedure: CHEST TUBE INSERTION;  Surgeon: Ivin Poot, MD;  Location: [facility];  Service: Thoracic;  Laterality: Left;  . INCISIONAL HERNIA REPAIR N/A 11/13/2014   Procedure: PRIMARY REPAIR INCARCERATED INCISIONAL HERNIA WITH PLACEMENT BIOLOGIC MESH;  Surgeon: Rolm Bookbinder, MD;  Location: [facility];  Service: General;  Laterality: N/A;  . LAPAROTOMY Right 09   ovarian cyst  . TOTAL ABDOMINAL HYSTERECTOMY W/ BILATERAL SALPINGOOPHORECTOMY  97     OB History   No obstetric history on file.     Family History  Problem Relation Age of Onset  . Hypertension Other   . Heart attack Other   . Leukemia Paternal Uncle        leukemia  . Heart attack Mother   . Hypertension Mother   . Heart disease Father     Social History   Tobacco Use  . Smoking status: Former Smoker    Packs/day: 1.00    Years: 15.00    Pack years: 15.00    Types: Cigarettes    Quit date: 09/15/2014    Years since quitting: 6.0  . Smokeless tobacco: Never Used  Vaping Use  . Vaping Use: Never used  Substance Use Topics  . Alcohol use: Yes    Comment: rare  . Drug use: No    Home Medications Prior to Admission medications   Medication Sig Start Date End Date Taking? Authorizing Provider  Baclofen 5 MG TABS Take 5 mg by mouth 3 (three) times daily.  06/26/20   [provider]  carvedilol (COREG) 3.125 MG tablet Take 1 tablet (3.125 mg total) by mouth 2 (two) times daily with a meal. Patient taking differently: Take 6.25 mg by mouth 2 (two) times daily with a meal.  05/18/20   Andrew Au, MD  diclofenac Sodium (VOLTAREN) 1 % GEL Apply 4 g topically 4 (four) times daily as needed (knee pain). 05/18/20   Andrew Au, MD  loperamide (IMODIUM) 2 MG capsule Take 1 capsule (2 mg total) by mouth as needed for diarrhea or loose stools. 05/18/20   Andrew Au, MD  nystatin (MYCOSTATIN/NYSTOP) powder Apply 1 application topically 3 (three) times daily. 08/06/20    Jose Persia, MD  potassium chloride (K-DUR) 10 MEQ tablet Take 1 tablet (10 mEq total) by mouth daily. 06/20/17   Caren Griffins, MD  ramelteon (ROZEREM) 8 MG tablet Take 1 tablet (8 mg total) by mouth at bedtime. 05/18/20   Andrew Au, MD  torsemide (DEMADEX) 20 MG tablet Take 3 tablets (60 mg total) by mouth daily. 07/11/20 08/10/20  Arrien, Jimmy Picket, MD    Allergies    Patient has no known allergies.  Review of Systems   Review of Systems  Unable to perform ROS: Other    Physical Exam Updated Vital Signs BP (!) 120/57   Pulse (!) 115   Temp (!) 101.7 F (38.7 C) (Oral)  Resp (!) 33   Ht 5' (1.524 m)   Wt 83.9 kg   SpO2 95%   BMI 36.11 kg/m   Physical Exam Vitals and nursing note reviewed.  Constitutional:      Appearance: She is well-developed and well-nourished.     Comments: Chronically appearing.  Obese  HENT:     Head: Normocephalic and atraumatic.  Eyes:     Conjunctiva/sclera: Conjunctivae normal.  Cardiovascular:     Rate and Rhythm: Normal rate and regular rhythm.  Pulmonary:     Effort: Pulmonary effort is normal.     Comments: Breath sounds diminished bilaterally.  Speaking full sentences.  4 L nasal cannula, satting 95% on room air. Abdominal:     General: Bowel sounds are normal.     Palpations: Abdomen is soft.     Tenderness: There is no abdominal tenderness. There is no guarding or rebound.  Musculoskeletal:     Comments: Significant edema to BLE with erythema, weeping, chronic skin changes. Malodorous. tender  Skin:    General: Skin is warm.  Neurological:     Mental Status: She is alert.  Psychiatric:        Mood and Affect: Mood and affect normal.        Behavior: Behavior normal.     ED Results / Procedures / Treatments   Labs (all labs ordered are listed, but only abnormal results are displayed) Labs Reviewed  BASIC METABOLIC PANEL - Abnormal; Notable for the following components:      Result Value   Potassium 3.1  (*)    Chloride 96 (*)    Glucose, Bld 190 (*)    All other components within normal limits  CBC - Abnormal; Notable for the following components:   WBC 23.1 (*)    Hemoglobin 11.1 (*)    HCT 35.5 (*)    Platelets 129 (*)    All other components within normal limits  DIFFERENTIAL - Abnormal; Notable for the following components:   Neutro Abs 27.4 (*)    Lymphs Abs 0.6 (*)    All other components within normal limits  HEPATIC FUNCTION PANEL - Abnormal; Notable for the following components:   Total Protein 6.4 (*)    Albumin 3.0 (*)    Bilirubin, Direct 0.3 (*)    All other components within normal limits  CBC - Abnormal; Notable for the following components:   WBC 28.5 (*)    RBC 3.66 (*)    Hemoglobin 10.4 (*)    HCT 32.8 (*)    Platelets 128 (*)    All other components within normal limits  SARS CORONAVIRUS 2 (TAT 6-24 HRS)  URINE CULTURE  CULTURE, BLOOD (ROUTINE X 2)  CULTURE, BLOOD (ROUTINE X 2)  BRAIN NATRIURETIC PEPTIDE  URINALYSIS, ROUTINE W REFLEX MICROSCOPIC  LACTIC ACID, PLASMA  PROTIME-INR  APTT  LACTIC ACID, PLASMA  POC SARS CORONAVIRUS 2 AG -  ED  POC SARS CORONAVIRUS 2 AG -  ED    EKG EKG Interpretation  Date/Time:  Wednesday September 26 2020 11:04:55 EST Ventricular Rate:  128 PR Interval:  168 QRS Duration: 76 QT Interval:  290 QTC Calculation: 423 R Axis:   2 Text Interpretation: Sinus tachycardia Possible Left atrial enlargement Borderline ECG since last tracing no significant change Confirmed by Daleen Bo [phone]) on 09/26/2020 2:04:33 PM   Radiology DG Chest 2 View  Result Date: 09/26/2020 CLINICAL DATA:  Shortness of breath.  CHF. EXAM: CHEST - 2 VIEW COMPARISON:  07/07/2020 FINDINGS: Artifact degradation about the upper chest. Accentuation of expected thoracic kyphosis. Borderline cardiomegaly. Probable tiny bilateral pleural effusions. No pneumothorax. Low lung volumes. Mild interstitial edema. Left greater than right base airspace  disease. IMPRESSION: No significant change compared to 07/07/2020. Mild cardiomegaly and interstitial edema. Probable layering small bilateral pleural effusions with adjacent airspace disease. Most likely atelectasis. Electronically Signed   By: Abigail Miyamoto M.D.   On: 09/26/2020 11:31   CT Angio Chest PE W/Cm &/Or Wo Cm  Result Date: 09/26/2020 CLINICAL DATA:  70 year old female with concern for pulmonary embolism. EXAM: CT ANGIOGRAPHY CHEST WITH CONTRAST TECHNIQUE: Multidetector CT imaging of the chest was performed using the standard protocol during bolus administration of intravenous contrast. Multiplanar CT image reconstructions and MIPs were obtained to evaluate the vascular anatomy. CONTRAST:  1mL OMNIPAQUE IOHEXOL 350 MG/ML SOLN COMPARISON:  Chest CT dated 09/28/2013. FINDINGS: Cardiovascular: Mild cardiomegaly. No pericardial effusion. Mild atherosclerotic calcification of the thoracic aorta. No aneurysmal dilatation or dissection. Evaluation of the pulmonary arteries is limited due to suboptimal visualization the peripheral branches and respiratory motion artifact. No large or central pulmonary artery embolus identified. Mediastinum/Nodes: No hilar or mediastinal adenopathy. The esophagus is grossly unremarkable. No mediastinal fluid collection. Lungs/Pleura: Shallow inspiration. There are bibasilar subsegmental consolidative changes which may represent atelectasis. Pneumonia is not excluded clinical correlation is recommended. There is no pleural effusion pneumothorax. Mild centrilobular emphysema. The central airways are patent. Upper Abdomen: Faint indeterminate 2 x 3 cm hypodense lesion in the posterior right lobe of the liver. This was present on the prior CT of 2016 and better seen on that study and described as hemangioma. Musculoskeletal: There is osteopenia with degenerative changes of the spine and thoracic kyphosis. T4 hemangioma. Multilevel chronic appearing compression fractures. Age  indeterminate, likely subacute or chronic compression fracture of T8 with approximately 50% loss of vertebral body height. Correlation with point tenderness recommended. No retropulsed fragment. Review of the MIP images confirms the above findings. IMPRESSION: 1. No CT evidence of central pulmonary artery embolus. 2. Bibasilar subsegmental consolidative changes may represent atelectasis. Pneumonia is not excluded clinical correlation is recommended. 3. Multilevel chronic appearing compression fractures. Age indeterminate, likely subacute or chronic compression fracture of T8 with approximately 50% loss of vertebral body height. Correlation with point tenderness recommended. 4. Aortic Atherosclerosis (ICD10-I70.0) and Emphysema (ICD10-J43.9). Electronically Signed   By: Anner Crete M.D.   On: 09/26/2020 17:35    Procedures .Critical Care Performed by: Robinson, [place] N, PA-C Authorized by: Robinson, [place] N, PA-C   Critical care provider statement:    Critical care time (minutes):  45   Critical care time was exclusive of:  Separately billable procedures and treating other patients and teaching time   Critical care was necessary to treat or prevent imminent or life-threatening deterioration of the following conditions:  Sepsis   Critical care was time spent personally by me on the following activities:  Discussions with consultants, evaluation of patient's response to treatment, examination of patient, ordering and performing treatments and interventions, ordering and review of laboratory studies, ordering and review of radiographic studies, pulse oximetry, re-evaluation of patient's condition, obtaining history from patient or surrogate and review of old charts   I assumed direction of critical care for this patient from another provider in my specialty: no     (including critical care time)  Medications Ordered in ED Medications  lactated ringers infusion ( Intravenous New Bag/Given  09/26/20 1541)  vancomycin (VANCOREADY) IVPB 1500 mg/300 mL (has  no administration in time range)  vancomycin (VANCOCIN) IVPB 1000 mg/200 mL premix (has no administration in time range)  ceFEPIme (MAXIPIME) 2 g in sodium chloride 0.9 % 100 mL IVPB (has no administration in time range)  acetaminophen (TYLENOL) tablet 1,000 mg (1,000 mg Oral Given 09/26/20 1206)  ceFEPIme (MAXIPIME) 2 g in sodium chloride 0.9 % 100 mL IVPB (0 g Intravenous Stopped 09/26/20 1521)  metroNIDAZOLE (FLAGYL) IVPB 500 mg (0 mg Intravenous Stopped 09/26/20 1521)  iohexol (OMNIPAQUE) 350 MG/ML injection 80 mL (80 mLs Intravenous Contrast Given 09/26/20 1719)    ED Course  I have reviewed the triage vital signs and the nursing notes.  Pertinent labs & imaging results that were available during my care of the patient were reviewed by me and considered in my medical decision making (see chart for details).  Clinical Course as of 09/26/20 1747  Wed Sep 26, 2020  1403 DG Chest 2 View [EW]  [facility] accepting admission. [JR]    Clinical Course User Index [EW] Daleen Bo, MD [JR] Robinson, [place] N, PA-C   MDM Rules/Calculators/A&P                          Patient presenting from home with reported shortness of breath and altered mental status.  On arrival she is febrile, tachycardic, tachypneic.  She is on 4 L nasal cannula which is her baseline, satting 95% on room air.  She is not complaining of shortness of breath.  She is unable to provide much history, answers "I do not know" to most questions.  Daughter provided additional history as above.  Code sepsis initiated.  Consider respiratory source versus UTI versus cellulitis.  Antibiotics ordered including Rocephin to cover possible UTI, doxycycline to cover respiratory source, rocephin and doxycycline can cover skin as well, though may need abx adjustment.  Labs with significant leukocytosis of 28,000.  (Per daughter no recent steroid use).  Lactic acid is  1.7.  UA obtained via in and out cath is negative.  Chest x-ray with mild interstitial edema pleural effusions.  CTA of the chest was obtained to rule out PE and pneumonia.  Patient admitted to family medicine service for further management of sepsis.  Considered COVID-19 though seems less likely given recent infection in October, versus cellulitis, versus pneumonia.  Final Clinical Impression(s) / ED Diagnoses Final diagnoses:  Sepsis without acute organ dysfunction, due to unspecified organism [facility])    Rx / [place] Orders ED Discharge Orders    None       Robinson, [place] N, PA-C 09/26/20 1747    Daleen Bo, MD 09/27/20 1144

## 2020-09-26 NOTE — ED Notes (Signed)
On 4L at 94%

## 2020-09-26 NOTE — ED Triage Notes (Signed)
Pt with shob x 2 days, wears 4L O2 at home, 84% SpO2 on that. Severe edema and weeping to lower legs. Endorses compliance with diuretic.

## 2020-09-26 NOTE — ED Provider Notes (Signed)
  Face-to-face evaluation   History: She presents for evaluation of shortness of breath for several days, while wearing her usual oxygen.  O2 4 L at home.  Patient is not sure why she is here.  Physical exam: Obese, moderate leg edema with weepiness.  No respiratory distress.  No dysarthria.  Medical screening examination/treatment/procedure(s) were conducted as a shared visit with non-physician practitioner(s) and myself.  I personally evaluated the patient during the encounter    Daleen Bo, MD 09/27/20 1144

## 2020-09-26 NOTE — Progress Notes (Signed)
Pharmacy Antibiotic Note  [name] is a 70 y.o. female admitted on 09/26/2020 with sepsis.  Pharmacy has been consulted for vancomycin and cefepime dosing. Pt is febrile with Tmax 103.1 and WBC is elevated at 23.1. SCr is WNL.   Plan: Vancomycin 1500mg  IV x 1 then 1gm IV Q24H Cefepime 2gm IV Q8H F/u renal fxn, C&S, clinical status and peak/trough at SS  Height: 5' (152.4 cm) Weight: 83.9 kg (184 lb 14.4 oz) IBW/kg (Calculated) : 45.5  Temp (24hrs), Avg:102.6 F (39.2 C), Min:101.7 F (38.7 C), Max:103.1 F (39.5 C)  Recent Labs  Lab 09/26/20 1058  WBC 23.1*  CREATININE 0.76    Estimated Creatinine Clearance: 65.6 mL/min (by C-G formula based on SCr of 0.76 mg/dL).    No Known Allergies  Antimicrobials this admission: Vanc 11/12>> Cefepime 11/12>> Flagyl x 1 11/12  Dose adjustments this admission: N/A  Microbiology results: Pending  Thank you for allowing pharmacy to be a part of this patient's care.  Rayanne Padmanabhan, Rande Lawman 09/26/2020 2:06 PM

## 2020-09-27 DIAGNOSIS — A419 Sepsis, unspecified organism: Secondary | ICD-10-CM | POA: Diagnosis not present

## 2020-09-27 DIAGNOSIS — R4182 Altered mental status, unspecified: Secondary | ICD-10-CM | POA: Diagnosis not present

## 2020-09-27 DIAGNOSIS — L03114 Cellulitis of left upper limb: Secondary | ICD-10-CM | POA: Diagnosis not present

## 2020-09-27 DIAGNOSIS — L03115 Cellulitis of right lower limb: Secondary | ICD-10-CM | POA: Diagnosis not present

## 2020-09-27 LAB — BASIC METABOLIC PANEL
Anion gap: 9 (ref 5–15)
Anion gap: 9 (ref 5–15)
BUN: 14 mg/dL (ref 8–23)
BUN: 11 mg/dL (ref 8–23)
CO2: 30 mmol/L (ref 22–32)
CO2: 30 mmol/L (ref 22–32)
Calcium: 8.6 mg/dL — ABNORMAL LOW (ref 8.9–10.3)
Calcium: 9 mg/dL (ref 8.9–10.3)
Chloride: 98 mmol/L (ref 98–111)
Chloride: 101 mmol/L (ref 98–111)
Creatinine, Ser: 0.61 mg/dL (ref 0.44–1.00)
Creatinine, Ser: 0.54 mg/dL (ref 0.44–1.00)
GFR, Estimated: >60 mL/min (ref >60)
GFR, Estimated: >60 mL/min (ref >60)
Glucose, Bld: 85 mg/dL (ref 70–99)
Glucose, Bld: 135 mg/dL — ABNORMAL HIGH (ref 70–99)
Potassium: 2.6 mmol/L — CL (ref 3.5–5.1)
Potassium: 4 mmol/L (ref 3.5–5.1)
Sodium: 137 mmol/L (ref 135–145)
Sodium: 140 mmol/L (ref 135–145)

## 2020-09-27 LAB — CBC
HCT: 29.3 % — ABNORMAL LOW (ref 36.0–46.0)
Hemoglobin: 9.3 g/dL — ABNORMAL LOW (ref 12.0–15.0)
MCH: 28.5 pg (ref 26.0–34.0)
MCHC: 31.7 g/dL (ref 30.0–36.0)
MCV: 89.9 fL (ref 80.0–100.0)
Platelets: 95 10*3/uL — ABNORMAL LOW (ref 150–400)
RBC: 3.26 MIL/uL — ABNORMAL LOW (ref 3.87–5.11)
RDW: 15.8 % — ABNORMAL HIGH (ref 11.5–15.5)
WBC: 16.7 10*3/uL — ABNORMAL HIGH (ref 4.0–10.5)
nRBC: 0 % (ref 0.0–0.2)

## 2020-09-27 LAB — COMPREHENSIVE METABOLIC PANEL
ALT: 19 U/L (ref 0–44)
AST: 17 U/L (ref 15–41)
Albumin: 2.5 g/dL — ABNORMAL LOW (ref 3.5–5.0)
Alkaline Phosphatase: 91 U/L (ref 38–126)
Anion gap: 12 (ref 5–15)
BUN: 15 mg/dL (ref 8–23)
CO2: 28 mmol/L (ref 22–32)
Calcium: 8.8 mg/dL — ABNORMAL LOW (ref 8.9–10.3)
Chloride: 96 mmol/L — ABNORMAL LOW (ref 98–111)
Creatinine, Ser: 0.66 mg/dL (ref 0.44–1.00)
GFR, Estimated: >60 mL/min (ref >60)
Glucose, Bld: 87 mg/dL (ref 70–99)
Potassium: 2.5 mmol/L — CL (ref 3.5–5.1)
Sodium: 136 mmol/L (ref 135–145)
Total Bilirubin: 1 mg/dL (ref 0.3–1.2)
Total Protein: 5.7 g/dL — ABNORMAL LOW (ref 6.5–8.1)

## 2020-09-27 LAB — MAGNESIUM: Magnesium: 1.7 mg/dL (ref 1.7–2.4)

## 2020-09-27 LAB — LACTIC ACID, PLASMA: Lactic Acid, Venous: 0.7 mmol/L (ref 0.5–1.9)

## 2020-09-27 MED ORDER — MAGNESIUM SULFATE 2 GM/50ML IV SOLN
2.0000 g | Freq: Once | INTRAVENOUS | Status: AC
  Administered 2020-09-27: 2 g via INTRAVENOUS
  Filled 2020-09-27: qty 50

## 2020-09-27 MED ORDER — POTASSIUM CHLORIDE CRYS ER 20 MEQ PO TBCR
40.0000 meq | EXTENDED_RELEASE_TABLET | Freq: Two times a day (BID) | ORAL | Status: AC
  Administered 2020-09-27 (×2): 40 meq via ORAL
  Filled 2020-09-27 (×2): qty 2

## 2020-09-27 MED ORDER — POTASSIUM CHLORIDE 10 MEQ/100ML IV SOLN
10.0000 meq | INTRAVENOUS | Status: AC
  Administered 2020-09-27 (×3): 10 meq via INTRAVENOUS

## 2020-09-27 MED ORDER — POTASSIUM CHLORIDE 10 MEQ/100ML IV SOLN
10.0000 meq | INTRAVENOUS | Status: AC
  Administered 2020-09-27 (×2): 10 meq via INTRAVENOUS
  Filled 2020-09-27 (×5): qty 100

## 2020-09-27 NOTE — Progress Notes (Signed)
Attempted to call report, receiving nurse will call back

## 2020-09-27 NOTE — Progress Notes (Signed)
Unable to verify how many doses of potassium IV patient actually received. Spoke with Dr Bridgett Larsson and he okayed to give 4 more bags. Will proceed as planned.

## 2020-09-27 NOTE — ED Notes (Signed)
[facility] was called & is expected to bring this pt upstairs soon.

## 2020-09-27 NOTE — ED Notes (Signed)
Breakfast Ordered 

## 2020-09-27 NOTE — Progress Notes (Signed)
Date: 09/27/2020  Patient name: [name]  Medical record number: [id number]  Date of birth: 1951/03/26   I have seen and evaluated [name] and discussed their care with the Residency Team.  In brief, patient is a 70 year old female with a past medical history of chronic back pain, chronic hypoxic/hypercarbic respiratory failure on home O2, morbid obesity, heart failure with preserved ejection fraction, recent COVID-19 infection in October who presented to the ED with altered mental status and fevers.  History obtained from chart as patient is uncertain as to how she came to the hospital.  Per chart, patient lives with her daughter who noted that patient was able to carry on a conversation but "looked off" and was unable to say why her oxygen was below her chin.  Home health nurse who was there to change patient's lower extremity wound dressings found to be febrile at 101.6 F.  Patient also appeared more dyspneic than usual on her home O2 (3 to 4 L via nasal cannula).  EMS was called secondary to persistent confusion and intermittent fevers.  Patient had been complaining of increased soreness in her lower extremities over the last couple of weeks and the home health aide to change the dressings noted increased discharge from the wound.  No chest pain, no palpitations, no lightheadedness, no syncope, no focal weakness, no tingling or numbness, no headache, no blurry vision, no nausea or vomiting, no diarrhea, no abdominal pain.  Today, patient states that she feels well and would like to go home soon.  States that her breathing is at baseline and denies any other complaints currently.  PMHx, Fam Hx, and/or Soc Hx : As per resident admit note  Vitals:   09/27/20 0945 09/27/20 1157  BP: (!) 111/54 112/60  Pulse: 99 (!) 103  Resp: (!) 23 18  Temp:    SpO2: 91% (!) 88%   General: Awake, alert, oriented x3, NAD CVS: Regular rate and rhythm, normal heart sounds Lungs: CTA  bilaterally Abdomen: Soft, nontender, nondistended normoactive bowel sounds Extremities: Chronic venous stasis changes noted bilaterally with 2+ bilateral lower extremity pitting edema.  Peeling skin noted over both lower extremities.  Patient with 2 wounds on the lateral aspect of her left lower extremity with clear drainage but no purulence. HEENT: Normocephalic, atraumatic Psych: Normal mood and affect Skin: Peeling skin noted over bilateral lower extremities  Assessment and Plan: I have seen and evaluated the patient as outlined above. I agree with the formulated Assessment and Plan as detailed in the residents' note, with the following changes:   1.  Sepsis secondary to lower extremity cellulitis: -Patient presented to the ED with fevers, tachycardia, tachypnea, leukocytosis consistent with sepsis.  Patient was noted to have worsening pain over her lower extremities over the last couple of weeks and has been noted to have discharge from her left lower extremity over the last couple of days.  Yesterday, patient was noted to have weeping clear yellow fluid from both lower extremities.  This is consistent with lower extremity cellulitis. -Patient was started on cefepime, Flagyl and vancomycin yesterday in the ED with subsequent improvement of her leukocytosis to 16.7 from 28 -Patient has also remained afebrile since yesterday afternoon -Patient mental status is now back to baseline -We will follow-up blood cultures -Continue with IV fluids for now -We will continue vancomycin and cefepime day 2.  Flagyl was DC'd.  I suspect that we can transition her to IV vancomycin alone tomorrow versus transitioning to  oral antibiotics -Wound care consult follow-up and recommendations appreciated -We will follow-up repeat CBC tomorrow to monitor leukocytosis -No further work-up at this time.  I suspect patient should be stable for DC home in the next 1 to 2 days  Aldine Contes, MD 1/13/20223:21  PM

## 2020-09-27 NOTE — ED Notes (Signed)
Lunch Tray Ordered @ 1022. 

## 2020-09-27 NOTE — Consult Note (Addendum)
[facility] Nurse Consult Note: Patient receiving care in [facility] completed remotely after review of chart and review of photos Reason for Consult:Chronic skin changes to bilateral lower legs.  Wears weekly compression at home.   Wound type:chronic skin changes from venous insufficiency.  Anterior lower legs with some erythema noted.  Lichenification bilaterally. Full thickness wounds to the lateral LLE that are draining  Pressure Injury POA: NA Measurement: circumferential dry plaques to lower legs in gaiter areas.  Wound bed: intact dry skin Drainage (amount, consistency, odor) Brown drainage noted on dressing over the lateral LLE Periwound:edema, chronic skin changes and hemosiderin staining.  Dressing procedure/placement/frequency: Cleanse bilateral lower legs with soap and water and pat dry. Apply Xeroform gauze Kellie Simmering # [id number]) to bilateral LE except the lateral LLE, apply Aquacel Advantage Kellie Simmering # [id number]) over these wounds. Secure with kerlix from below toes to below knee.  Secure with ace bandage.  Change daily.   Monitor the wound area(s) for worsening of condition such as: Signs/symptoms of infection, increase in size, development of or worsening of odor, development of pain, or increased pain at the affected locations.   Notify the medical team if any of these develop.  Thank you for the consult. [facility] nurse will not follow at this time.   Please re-consult the [facility] team if needed.  Cathlean Marseilles Tamala Julian, MSN, RN, [place], [place] [place], [facility] Wound Treatment Associate Pager [phone]

## 2020-09-27 NOTE — Progress Notes (Signed)
   Subjective:   This morning, patient has not particular complaints except that she cannot get up and walk around due to being in the hospital. Feels hat her breathing is at baseline, and asks if she can go home. She denies nausea, vomiting, diarrhea or abdominal pain. After discussing her reason for hospitalization, she understands and agrees that she will need additional days of intravenous antibiotics prior to being ready for discharge. She has no further questions, concerns or requests for our team.  Objective:  Vital signs in last 24 hours: Vitals:   09/27/20 0300 09/27/20 0345 09/27/20 0515 09/27/20 0600  BP: (!) 127/47 (!) 119/48 (!) 119/51 (!) 113/56  Pulse: (!) 111 (!) 105 (!) 106 (!) 108  Resp: (!) 33 (!) 27 (!) 23 (!) 29  Temp:   98.9 F (37.2 C)   TempSrc:   Oral   SpO2: 90% 92% 91% 91%  Weight:      Height:        Physical Exam Constitutional: no acute distress Head: atraumatic ENT: external ears normal Eyes: EOMI Cardiovascular: regular rate and rhythm, normal heart sounds Pulmonary: effort normal, exam limited by habitus but has good air movement throughout Abdominal: flat, mild tenderness in LUQ, no rebound tenderness, bowel sounds normal Musculoskeletal: bilateral lower extremities with severe chronic venous stasis changes, very edematous but not overtly pitting, dry with peeling skin, 2 wounds on lateral aspect of LLE with clear drainage, no purulence Skin: warm and dry Neurological: alert, no focal deficit Psychiatric: normal mood and affect  Assessment/Plan: [name] is a 70 y.o. female with hx of OHS on 3-4L chronically, HFpEF, morbid obesity, HTN, chronic ITP, chronic back pain presenting with fever, confusion, and dyspnea. Suspect sepsis secondary to chronic stasis dermatitis. Vital signs and WBC count now improving with Abx.  Active Problems:   Sepsis ([place])  Fever, tachycardia, tachypnea, leukocytosis Chronic venous stasis dermatitis, no  purulence Concern for sepsis as she met SIRS criteria. SOFA score of 4, but all these points were for chronic problems, so sepsis is ruled out at this point. Febrile to 103.1 on admission, no fevers since. Tachycardia and tachypnea improving. Blood pressure has been stable though pulse pressure is wide, MAPs in 70s. LA negative x2. No clear infectious source, suspect stasis dermatitis as source. UA negative. Chest imaging with some atelectasis, seems unlikely to be pneumonia with lack of respiratory symptoms and no focal opacity. Blood cultures pending.  -Continue vancomycin and cefepime, day 2 - IV Vluids - Follow blood cultures - Wound care consult  Chronic hypoxic and hypercapnic respiratory failure Obesity hypoventilation syndrome Patient normally is on 3 to 4 L at home, requiring 4 L here. Feels that her breathing is at baseline. - Continue outpatient medications.  Chronic ITP Platelet count of 128K on admission which is improved from 50-60k on last admission.  No signs of bleeding. - Daily CBC   Chronic Back and knee pain - prn tylenol and voltaren gel  Diet:  normal IVF:  Lactated ringers VTE:  lovenox Prior to Admission Living Arrangement:  home Anticipated Discharge Location:  home Barriers to Discharge:  Requires IV Abx Dispo: Anticipated discharge in approximately 1-2 day(s).   Andrew Au, MD 09/27/2020, 6:44 AM Pager: [phone] After 5pm on weekdays and 1pm on weekends: On Call pager [phone]

## 2020-09-28 DIAGNOSIS — A419 Sepsis, unspecified organism: Secondary | ICD-10-CM | POA: Diagnosis not present

## 2020-09-28 DIAGNOSIS — R4182 Altered mental status, unspecified: Secondary | ICD-10-CM

## 2020-09-28 DIAGNOSIS — L03115 Cellulitis of right lower limb: Secondary | ICD-10-CM | POA: Diagnosis not present

## 2020-09-28 DIAGNOSIS — L03114 Cellulitis of left upper limb: Secondary | ICD-10-CM | POA: Diagnosis not present

## 2020-09-28 LAB — CBC
HCT: 34.8 % — ABNORMAL LOW (ref 36.0–46.0)
Hemoglobin: 10.1 g/dL — ABNORMAL LOW (ref 12.0–15.0)
MCH: 27.2 pg (ref 26.0–34.0)
MCHC: 29 g/dL — ABNORMAL LOW (ref 30.0–36.0)
MCV: 93.5 fL (ref 80.0–100.0)
Platelets: 104 10*3/uL — ABNORMAL LOW (ref 150–400)
RBC: 3.72 MIL/uL — ABNORMAL LOW (ref 3.87–5.11)
RDW: 15.7 % — ABNORMAL HIGH (ref 11.5–15.5)
WBC: 17.9 10*3/uL — ABNORMAL HIGH (ref 4.0–10.5)
nRBC: 0 % (ref 0.0–0.2)

## 2020-09-28 LAB — BLOOD GAS, ARTERIAL
Acid-Base Excess: 5.3 mmol/L — ABNORMAL HIGH (ref 0.0–2.0)
Bicarbonate: 33 mmol/L — ABNORMAL HIGH (ref 20.0–28.0)
Drawn by: 31101
FIO2: 50
O2 Saturation: 96 %
Patient temperature: 38.3
pCO2 arterial: 94.3 mmHg (ref 32.0–48.0)
pH, Arterial: 7.179 — CL (ref 7.350–7.450)
pO2, Arterial: 95 mmHg (ref 83.0–108.0)

## 2020-09-28 LAB — BASIC METABOLIC PANEL
Anion gap: 8 (ref 5–15)
BUN: 10 mg/dL (ref 8–23)
CO2: 29 mmol/L (ref 22–32)
Calcium: 9.1 mg/dL (ref 8.9–10.3)
Chloride: 103 mmol/L (ref 98–111)
Creatinine, Ser: 0.54 mg/dL (ref 0.44–1.00)
GFR, Estimated: >60 mL/min (ref >60)
Glucose, Bld: 104 mg/dL — ABNORMAL HIGH (ref 70–99)
Potassium: 4.5 mmol/L (ref 3.5–5.1)
Sodium: 140 mmol/L (ref 135–145)

## 2020-09-28 LAB — BLOOD GAS, VENOUS
Acid-Base Excess: 4.2 mmol/L — ABNORMAL HIGH (ref 0.0–2.0)
Bicarbonate: 32.3 mmol/L — ABNORMAL HIGH (ref 20.0–28.0)
O2 Saturation: 84.9 %
Patient temperature: 37
pCO2, Ven: 92.3 mmHg (ref 44.0–60.0)
pH, Ven: 7.17 — CL (ref 7.250–7.430)
pO2, Ven: 56.1 mmHg — ABNORMAL HIGH (ref 32.0–45.0)

## 2020-09-28 MED ORDER — ONDANSETRON HCL 4 MG/2ML IJ SOLN
4.0000 mg | Freq: Four times a day (QID) | INTRAMUSCULAR | Status: DC | PRN
  Administered 2020-09-28: 4 mg via INTRAVENOUS
  Filled 2020-09-28: qty 2

## 2020-09-28 MED ORDER — CEPHALEXIN 250 MG PO CAPS: 500.0000 mg | ORAL_CAPSULE | Freq: Two times a day (BID) | ORAL | Status: DC

## 2020-09-28 MED ORDER — FUROSEMIDE 80 MG PO TABS
80.0000 mg | ORAL_TABLET | Freq: Every day | ORAL | Status: DC
  Administered 2020-09-28: 80 mg via ORAL
  Filled 2020-09-28: qty 1

## 2020-09-28 MED ORDER — FUROSEMIDE 10 MG/ML IJ SOLN
40.0000 mg | Freq: Every day | INTRAMUSCULAR | Status: DC
  Filled 2020-09-28: qty 4

## 2020-09-28 MED ORDER — CEFAZOLIN SODIUM-DEXTROSE 1-4 GM/50ML-% IV SOLN
1.0000 g | Freq: Three times a day (TID) | INTRAVENOUS | Status: DC
  Administered 2020-09-28 – 2020-09-30 (×6): 1 g via INTRAVENOUS
  Filled 2020-09-28 (×8): qty 50

## 2020-09-28 MED ORDER — POTASSIUM CHLORIDE ER 10 MEQ PO TBCR
10.0000 meq | EXTENDED_RELEASE_TABLET | Freq: Every day | ORAL | Status: DC
  Administered 2020-09-28: 10 meq via ORAL
  Filled 2020-09-28 (×2): qty 1

## 2020-09-28 MED ORDER — FUROSEMIDE 80 MG PO TABS: 80.0000 mg | ORAL_TABLET | ORAL | Status: DC

## 2020-09-28 MED ORDER — SODIUM CHLORIDE 0.9 % IV SOLN
100.0000 mg | Freq: Two times a day (BID) | INTRAVENOUS | Status: DC
  Administered 2020-09-28 – 2020-09-30 (×4): 100 mg via INTRAVENOUS
  Filled 2020-09-28 (×5): qty 100

## 2020-09-28 MED ORDER — DOXYCYCLINE HYCLATE 100 MG PO TABS: 100.0000 mg | ORAL_TABLET | Freq: Two times a day (BID) | ORAL | Status: DC

## 2020-09-28 NOTE — TOC Initial Note (Signed)
Transition of Care [facility]) - Initial/Assessment Note    Patient Details  Name: [name] MRN: [id number] Date of Birth: [birth year]  [facility] CM/SW Contact:    Marilu Favre, RN Phone Number: 09/28/2020, 1:17 PM  Clinical Narrative:                 Patient declining SNF . From home with daughter and HHPT/RN/aide through [facility].  Amy with [facility] can accept back. Asked for home health orders for RN,PT and aide and face to face . Possible discharge 1/15 may need ambulance transport.   Expected Discharge Plan: [place]     Patient Goals and CMS Choice Patient states their goals for this hospitalization and ongoing recovery are:: to return tohome CMS Medicare.gov Compare Post Acute Care list provided to:: Patient Choice offered to / list presented to : Patient  Expected Discharge Plan and Services Expected Discharge Plan: [place]   Discharge Planning Services: CM Consult Post Acute Care Choice: [place] arrangements for the past 2 months: Single Family Home                 DME Arranged: N/A         HH Arranged: RN,PT,Nurse's Aide,Refused SNF HH Agency: [facility] Date [place]: 09/28/20 Time [place]: [id number] Representative spoke with at [place]: Amy  Prior Living Arrangements/Services Living arrangements for the past 2 months: [place] with:: Adult Children Patient language and need for interpreter reviewed:: Yes Do you feel safe going back to the place where you live?: Yes      Need for Family Participation in Patient Care: Yes (Comment) Care giver support system in place?: Yes (comment)   Criminal Activity/Legal Involvement Pertinent to Current Situation/Hospitalization: No - Comment as needed  Activities of Daily Living      Permission Sought/Granted   Permission granted to share information with : No               Emotional Assessment       Orientation: : Oriented to Self,Oriented to Place,Oriented to  Time,Oriented to Situation Alcohol / Substance Use: Not Applicable Psych Involvement: No (comment)  Admission diagnosis:  Sepsis ([place]) [A41.9] Sepsis without acute organ dysfunction, due to unspecified organism [facility]) [A41.9] Patient Active Problem List   Diagnosis Date Noted  . Sepsis ([place]) 09/26/2020  . Pneumonia due to COVID-19 virus 07/04/2020  . Respiratory failure ([place]) 06/30/2020  . Respiratory failure with hypoxia ([place]) 06/29/2020  . Shock ([place]) 05/14/2020  . Chronic, continuous use of opioids 05/14/2020  . Opioid overdose ([place]) 05/14/2020  . Acute on chronic respiratory failure with hypoxia and hypercapnia ([place]) 05/14/2020  . Pressure injury of skin 04/22/2020  . Sepsis due to cellulitis ([place]) 04/18/2020  . Venous stasis dermatitis of both lower extremities 02/25/2019  . Encounter for preventive care 10/20/2018  . Chronic respiratory failure with hypoxia and hypercapnia ([place]) 10/06/2018  . Polycythemia, secondary 10/05/2018  . Obesity hypoventilation syndrome ([place]) 10/04/2018  . AKI (acute kidney injury) ([place])   . BMI 40.0-44.9, adult ([place])   . Bilateral lower extremity edema   . Venous stasis dermatitis 09/30/2018  . Wound of lower extremity, bilateral 09/30/2018  . Chronic heart failure with preserved ejection fraction with moderate RV failure 06/09/2017  . GERD (gastroesophageal reflux disease) 11/13/2014  . Opiate-induced constipation 11/13/2014  . History of tobacco abuse 03/10/2014  . Chronic  back pain secondary to traumatic L1 fracture 09/29/2013  . Chronic idiopathic thrombocytopenia ([place]) 08/29/2013  . HTN (hypertension)    PCP:  [name], MD Pharmacy:   [facility] [place], [place] - [address] AT [address] [place] [address] Phone: [phone] Fax: [phone]     Social Determinants of  Health (SDOH) Interventions    Readmission Risk Interventions No flowsheet data found.

## 2020-09-28 NOTE — Progress Notes (Signed)
   09/28/20 2100  Assess: MEWS Score  Temp 98.2 F (36.8 C)  BP (!) 119/95  Pulse Rate (!) 109  ECG Heart Rate (!) 109  Resp 20  Level of Consciousness Responds to Pain  SpO2 96 %  O2 Device Bi-PAP  Patient Activity (if Appropriate) In bed  Assess: MEWS Score  MEWS Temp 0  MEWS Systolic 0  MEWS Pulse 1  MEWS RR 0  MEWS LOC 2  MEWS Score 3  MEWS Score Color Yellow  Assess: if the MEWS score is Yellow or Red  Were vital signs taken at a resting state? Yes  Focused Assessment No change from prior assessment  Early Detection of Sepsis Score *See Row Information* High  MEWS guidelines implemented *See Row Information* No, previously yellow, continue vital signs every 4 hours  Treat  Pain Scale 0-10  Pain Score Asleep  Notify: Charge Nurse/RN  Name of Charge Nurse/RN Notified J[place] RN  Date Charge Nurse/RN Notified 09/28/20  Time Charge Nurse/RN Notified 2100   Pt is on Bipap. Previous yellow MEWS score. Pt responds to movement and pain, does not respond verbally.

## 2020-09-28 NOTE — Progress Notes (Signed)
Pt transferred to 3 W 25 with Bi Pap in place and RT to help with transfer.  Report gien to RN 3 W.

## 2020-09-28 NOTE — Evaluation (Signed)
Physical Therapy Evaluation Patient Details Name: [name] MRN: [id number] DOB: Feb 10, 1951 Today's Date: 09/28/2020   History of Present Illness  Pt is 70 yo female with PMH including chronic back pain after L1 fracture, chronic hypoxic/hypercarbic respiratory failure on 3-4 L [place], morbid obesity, HFpEF, chronic ITP, hypertension, recent COVID-19 pneumonia in October 2021. She presented after an episode of confusion and found to be febrile and dyspneic.  Pt admitted with sepsis related to lower extremity cellulitis.  Clinical Impression  Pt admitted with above diagnosis. Pt with limited abilities at evaluation due to pain and fatigue.  She required max x 2 to transfer to EOB and for bed mobility.  Pt declined attempts to stand or get to chair.  Pt with weakness in bil LE with pain and edema.  She was poor historian , so hx obtained from prior visit.  Per that visit, pt resides with daughter and is able to perform limited ambulation in home with RW.   Pt currently with functional limitations due to the deficits listed below (see PT Problem List). Pt will benefit from skilled PT to increase their independence and safety with mobility to allow discharge to the venue listed below.       Follow Up Recommendations SNF    Equipment Recommendations  Wheelchair cushion (measurements PT);Wheelchair (measurements PT);Hospital bed;3in1 (PT);Other (comment) (hoyer)    Recommendations for Other Services       Precautions / Restrictions Precautions Precautions: Fall      Mobility  Bed Mobility Overal bed mobility: Needs Assistance Bed Mobility: Supine to Sit;Sit to Supine;Rolling Rolling: Max assist;+2 for physical assistance   Supine to sit: +2 for physical assistance;Max assist Sit to supine: +2 for physical assistance;Max assist   General bed mobility comments: Pt providing little assistance , she did reach with hands when cued  and slight assist with trunk but otherwise max x 2 for all;  of note pt sleeps in recliner at home    Transfers                 General transfer comment: declined attempts to stand  Ambulation/Gait                Stairs            Wheelchair Mobility    Modified Rankin (Stroke Patients Only)       Balance Overall balance assessment: Needs assistance Sitting-balance support: No upper extremity supported Sitting balance-Leahy Scale: Fair Sitting balance - Comments: Sat EOB for 8-10 minutes with supervision but did not challenge balance                                     Pertinent Vitals/Pain Pain Assessment: Faces Faces Pain Scale: Hurts whole lot Pain Location: Legs, back,  and generalized with any movement/touch Pain Descriptors / Indicators: Grimacing;Moaning Pain Intervention(s): Limited activity within patient's tolerance;Monitored during session;Relaxation;Repositioned    Home Living Family/patient expects to be discharged to:: Skilled nursing facility Living Arrangements: Children Available Help at Discharge: Family;Available 24 hours/day Type of Home: House Home Access: Stairs to enter Entrance Stairs-Rails: Can reach both Entrance Stairs-Number of Steps: 3 Home Layout: One level Home Equipment: Cane - single point;Bedside commode;Other (comment) (lift chair) Additional Comments: .  Obtained from prior visit - pt unable to provide details today    Prior Function Level of Independence: Needs assistance   Gait / Transfers Assistance Needed:  Reports using a walker to ambulate and has a lift chair for sit to stand.  Sleeps in lift chair.  ADL's / Homemaking Assistance Needed: Reports having assistance with dressing and bathing (reports "rehab tech" (home CNA?) or her daughter does). Patient originally reports being able to ambulate to the bathroom and perform toileting by herself but then states aid/daugher helps her. Answers a lot of questions with "I don't know."  Comments: PLOF obtained  from prior visit     Hand Dominance   Dominant Hand: Right    Extremity/Trunk Assessment   Upper Extremity Assessment Upper Extremity Assessment: Defer to OT evaluation    Lower Extremity Assessment Lower Extremity Assessment: LLE deficits/detail;RLE deficits/detail RLE Deficits / Details: Limited by pain and edema.  Bil LE in wraps, with edema, and dry flakey skin.   ROM appear functional but does have some limitations due to pain and body habitus.  MMT: pt with difficulty following commands and painful, appears to be at least 3/5 at knee and ankle and 1/5 in hips LLE Deficits / Details: Limited by pain and edema.  Bil LE in wraps, with edema, and dry flakey skin.   ROM appear functional but does have some limitations due to pain and body habitus.  MMT: pt with difficulty following commands and painful, appears to be at least 3/5 at knee and ankle and 1/5 in hips    Cervical / Trunk Assessment Cervical / Trunk Assessment: Kyphotic (Body habitus)  Communication   Communication: No difficulties  Cognition Arousal/Alertness: Lethargic Behavior During Therapy: WFL for tasks assessed/performed Overall Cognitive Status: No family/caregiver present to determine baseline cognitive functioning Area of Impairment: Orientation;Attention;Memory;Following commands;Awareness;Problem solving                 Orientation Level: Disoriented to;Time;Situation Current Attention Level: Sustained Memory: Decreased short-term memory Following Commands: Follows one step commands with increased time   Awareness: Intellectual Problem Solving: Slow processing;Decreased initiation;Difficulty sequencing;Requires verbal cues;Requires tactile cues        General Comments      Exercises     Assessment/Plan    PT Assessment Patient needs continued PT services  PT Problem List Decreased strength;Decreased mobility;Decreased range of motion;Decreased activity tolerance;Decreased  cognition;Cardiopulmonary status limiting activity;Decreased balance;Decreased knowledge of use of DME;Pain;Decreased skin integrity       PT Treatment Interventions DME instruction;Therapeutic activities;Gait training;Therapeutic exercise;Patient/family education;Balance training;Functional mobility training;Modalities    PT Goals (Current goals can be found in the Care Plan section)  Acute Rehab PT Goals Patient Stated Goal: decrease pain PT Goal Formulation: With patient Time For Goal Achievement: 10/12/20 Potential to Achieve Goals: Fair    Frequency Min 2X/week   Barriers to discharge Other (comment) amount of assist required    Co-evaluation PT/OT/SLP Co-Evaluation/Treatment: Yes Reason for Co-Treatment: Complexity of the patient's impairments (multi-system involvement);For patient/therapist safety;Other (comment) (morbid obesity and pain) PT goals addressed during session: Mobility/safety with mobility;Balance OT goals addressed during session: ADL's and self-care       AM-PAC PT "6 Clicks" Mobility  Outcome Measure Help needed turning from your back to your side while in a flat bed without using bedrails?: Total Help needed moving from lying on your back to sitting on the side of a flat bed without using bedrails?: Total Help needed moving to and from a bed to a chair (including a wheelchair)?: Total Help needed standing up from a chair using your arms (e.g., wheelchair or bedside chair)?: Total Help needed to walk in hospital room?: Total  Help needed climbing 3-5 steps with a railing? : Total 6 Click Score: 6    End of Session Equipment Utilized During Treatment: Oxygen (4L) Activity Tolerance: Patient limited by pain;Patient limited by fatigue Patient left: in bed;with call bell/phone within reach;with bed alarm set Nurse Communication: Mobility status;Need for lift equipment;Other (comment) (decreased skin integrity on buttock; pt had BM; needs new Purewick) PT  Visit Diagnosis: Other abnormalities of gait and mobility (R26.89);Muscle weakness (generalized) (M62.81)    Time: 1030-1100 PT Time Calculation (min) (ACUTE ONLY): 30 min   Charges:   PT Evaluation $PT Eval Moderate Complexity: 1 Melina Schools, PT Acute Rehab Services Pager [phone] [facility] [phone]    Karlton Lemon 09/28/2020, 11:35 AM

## 2020-09-28 NOTE — Discharge Summary (Signed)
Name: [name] MRN: [id number] DOB: 1951-08-21 70 y.o. PCP: [name], MD  Date of Admission: 09/26/2020 10:53 AM Date of Discharge:  09/29/20 Attending Physician: Joni Reining  Discharge Diagnosis: 1. Cellulitis secondary to chronic venous stasis dermatitis 2. Acute on chronic hypercapneic respiratory failure due to obesity hypoventilation syndrome  Discharge Medications: Allergies as of 09/30/2020   No Known Allergies     Medication List    STOP taking these medications   nystatin powder Commonly known as: MYCOSTATIN/NYSTOP   ramelteon 8 MG tablet Commonly known as: ROZEREM   torsemide 20 MG tablet Commonly known as: DEMADEX     TAKE these medications   Aquacel Ag Advantage 4"X5" Pads Apply 1 application topically daily.   carvedilol 3.125 MG tablet Commonly known as: COREG Take 1 tablet (3.125 mg total) by mouth 2 (two) times daily with a meal. What changed: how much to take   cyanocobalamin 1000 MCG tablet Take 1 tablet (1,000 mcg total) by mouth daily. Start taking on: October 01, 2020   diclofenac Sodium 1 % Gel Commonly known as: VOLTAREN Apply 4 g topically 4 (four) times daily as needed (knee pain).   furosemide 80 MG tablet Commonly known as: LASIX Take 80 mg by mouth See admin instructions. Take 80mg  by mouth once daily.  Take an additional 80mg  daily as needed for weight gain of 3 pounds in one week or 5 pounds in one day.   loperamide 2 MG capsule Commonly known as: IMODIUM Take 1 capsule (2 mg total) by mouth as needed for diarrhea or loose stools.   potassium chloride 10 MEQ tablet Commonly known as: KLOR-CON Take 1 tablet (10 mEq total) by mouth daily.   promethazine 12.5 MG tablet Commonly known as: PHENERGAN Take 12.5 mg by mouth every 6 (six) hours as needed for nausea or vomiting.   Xeroform Petrolat Gauze 5"x9" Misc Apply 1 application topically daily.            Discharge Care Instructions  (From admission,  onward)         Start     Ordered   09/30/20 0000  Change dressing (specify)       Comments: Dressing change: daily. Apply aquacel advantage to wound. Apply xeroform gauze to other areas of lower legs. Secure with kerlix from below the toes to below the knees then secure with an ace bandage.   09/30/20 1242          Disposition and follow-up:   [name] was discharged from [facility] in Stable condition.  At the hospital follow up visit please address:  1.  Follow up: Marland Kitchen Chronic venous stasis dermatitis - continue biweekly dressing changes by [facility]. Completed antibiotics.  . Chronic hypercapneic respiratory failure due to OHS - continue home CPAP. Discharged on home 3L  2.  Labs / imaging needed at time of follow-up: BMP  3.  Pending labs/ test needing follow-up: none  Follow-up Appointments:  Follow-up Information    [facility] Follow up.   Specialty: [place] Why: HHRN/PT/aide- services to resume they will contact you to set up visits Contact information: [place] [phone]               Hospital Course by problem list: Cellulitis 2/2 chronic venous stasis dermatitis, ruled out sepsis Presented with fever of 103.1, tachycardic, and tachypnea. Blood cultures collected and negative to date. Started on vanc and cefepime.  Improved overnight with WBC from 32 > 17 and resolution of fevers. Though SOFA score of 4 these point were from chronic issues, so no evidence of distant organ damage. Do not believe she had sepsis. Blood pressures stable throughout admission. UA negative, CXR negative for infection. Most likely source is chronic venous stasis dermatitis. Completed oral doxycycline and ancef inpatient. Has HH wound care. Offered SNF, but patient has had bad experience previously and prefers [facility].  Acute on chronic hypoxic and hypercapnic respiratory failure Obesity hypoventilation  syndrome Patient normally is on 3 to 4 L at home, initially requiring 4 L here. Feels that her breathing is at baseline throughout admission, but noted to have some pursed lip breathing and was somnolent on 3rd day of admission. VBG was checked and was hypercarbic above her baseline. She was placed on bipap overnight with improvement in respiratory status although continued to require 7L [place] the following day. ABG showed chronic respiratory acidosis at baseline. She was diuresed with additional lasix and improved to home 4L later that evening and the following day with improvement in volume status as well. She was discharged to continue home lasix and will follow-up in IMTS clinic.   Discharge Vitals:   BP (!) 146/74 (BP Location: Left Arm)   Pulse 94   Temp 98.1 F (36.7 C) (Oral)   Resp 17   Ht 5' (1.524 m)   Wt 89.6 kg   SpO2 96%   BMI 38.58 kg/m   Pertinent Labs, Studies, and Procedures:  DG Chest 2 View  Result Date: 09/26/2020 CLINICAL DATA:  Shortness of breath.  CHF. EXAM: CHEST - 2 VIEW COMPARISON:  07/07/2020 FINDINGS: Artifact degradation about the upper chest. Accentuation of expected thoracic kyphosis. Borderline cardiomegaly. Probable tiny bilateral pleural effusions. No pneumothorax. Low lung volumes. Mild interstitial edema. Left greater than right base airspace disease. IMPRESSION: No significant change compared to 07/07/2020. Mild cardiomegaly and interstitial edema. Probable layering small bilateral pleural effusions with adjacent airspace disease. Most likely atelectasis. Electronically Signed   By: Abigail Miyamoto M.D.   On: 09/26/2020 11:31   CT Angio Chest PE W/Cm &/Or Wo Cm  Result Date: 09/26/2020 CLINICAL DATA:  70 year old female with concern for pulmonary embolism. EXAM: CT ANGIOGRAPHY CHEST WITH CONTRAST TECHNIQUE: Multidetector CT imaging of the chest was performed using the standard protocol during bolus administration of intravenous contrast. Multiplanar CT image  reconstructions and MIPs were obtained to evaluate the vascular anatomy. CONTRAST:  67mL OMNIPAQUE IOHEXOL 350 MG/ML SOLN COMPARISON:  Chest CT dated 09/28/2013. FINDINGS: Cardiovascular: Mild cardiomegaly. No pericardial effusion. Mild atherosclerotic calcification of the thoracic aorta. No aneurysmal dilatation or dissection. Evaluation of the pulmonary arteries is limited due to suboptimal visualization the peripheral branches and respiratory motion artifact. No large or central pulmonary artery embolus identified. Mediastinum/Nodes: No hilar or mediastinal adenopathy. The esophagus is grossly unremarkable. No mediastinal fluid collection. Lungs/Pleura: Shallow inspiration. There are bibasilar subsegmental consolidative changes which may represent atelectasis. Pneumonia is not excluded clinical correlation is recommended. There is no pleural effusion pneumothorax. Mild centrilobular emphysema. The central airways are patent. Upper Abdomen: Faint indeterminate 2 x 3 cm hypodense lesion in the posterior right lobe of the liver. This was present on the prior CT of 2016 and better seen on that study and described as hemangioma. Musculoskeletal: There is osteopenia with degenerative changes of the spine and thoracic kyphosis. T4 hemangioma. Multilevel chronic appearing compression fractures. Age indeterminate, likely subacute or chronic compression fracture of T8 with approximately  50% loss of vertebral body height. Correlation with point tenderness recommended. No retropulsed fragment. Review of the MIP images confirms the above findings. IMPRESSION: 1. No CT evidence of central pulmonary artery embolus. 2. Bibasilar subsegmental consolidative changes may represent atelectasis. Pneumonia is not excluded clinical correlation is recommended. 3. Multilevel chronic appearing compression fractures. Age indeterminate, likely subacute or chronic compression fracture of T8 with approximately 50% loss of vertebral body  height. Correlation with point tenderness recommended. 4. Aortic Atherosclerosis (ICD10-I70.0) and Emphysema (ICD10-J43.9). Electronically Signed   By: Anner Crete M.D.   On: 09/26/2020 17:35    Discharge Instructions: Discharge Instructions    Call MD for:  difficulty breathing, headache or visual disturbances   Complete by: As directed    Call MD for:  persistant nausea and vomiting   Complete by: As directed    Change dressing (specify)   Complete by: As directed    Dressing change: daily. Apply aquacel advantage to wound. Apply xeroform gauze to other areas of lower legs. Secure with kerlix from below the toes to below the knees then secure with an ace bandage.   Diet - low sodium heart healthy   Complete by: As directed    Discharge instructions   Complete by: As directed    You were hospitalized for lower extremity infection. Thank you for allowing [place] to be part of your care.   We [facility] - they will call you to schedule an appointment. If you do not hear from the clinic Tuesday, January 18th, please call the clinic at [phone].   Please note these changes made to your medications:   Please call our clinic if you have any questions or concerns, we may be able to help and keep you from a long and expensive emergency room wait. Our clinic and after hours phone number is [phone], the best time to call is Monday through Friday 9 am to 4 pm but there is always someone available 24/7 if you have an emergency. If you need medication refills please notify your pharmacy one week in advance and they will send [place] a request.   Increase activity slowly   Complete by: As directed       Signed: Molli Hazard A, DO 09/30/2020, 2:35 PM   Pager: [phone]

## 2020-09-28 NOTE — Plan of Care (Signed)
  Problem: Education: Goal: Knowledge of General Education information will improve Description Including pain rating scale, medication(s)/side effects and non-pharmacologic comfort measures Outcome: Progressing   

## 2020-09-28 NOTE — Progress Notes (Signed)
   09/28/20 1852  Assess: MEWS Score  Temp (!) 100.9 F (38.3 C)  BP 129/69  Pulse Rate (!) 111  Resp 20  SpO2 94 %  O2 Device Bi-PAP  Assess: MEWS Score  MEWS Temp 1  MEWS Systolic 0  MEWS Pulse 2  MEWS RR 0  MEWS LOC 0  MEWS Score 3  MEWS Score Color Yellow  Assess: if the MEWS score is Yellow or Red  Were vital signs taken at a resting state? Yes  Focused Assessment No change from prior assessment  Early Detection of Sepsis Score *See Row Information* High  MEWS guidelines implemented *See Row Information* No, previously yellow, continue vital signs every 4 hours  Treat  Pain Scale 0-10  Pain Score Asleep  Escalate  MEWS: Escalate Yellow: discuss with charge nurse/RN and consider discussing with provider and RRT  Notify: Charge Nurse/RN  Name of Charge Nurse/RN Notified Beverlee Nims RN  Date Charge Nurse/RN Notified 09/28/20  Time Charge Nurse/RN Notified [place]   Patient arrived to unit with a yellow mews score.  Respiratory,rapid response nurse and bedside nurse brought pt to unit.  BiPaP in place.  Pt moans with movement, pt is not responding verbally at this time. Pt is unable to take PO medications at this time. Bedside monitor in place and CCMD called.   Call bell within reach.  Charge nurse aware of pt arrival on yellow mews score.

## 2020-09-28 NOTE — Progress Notes (Signed)
Patient was transported to 3E25 on BIPAP without any complications.

## 2020-09-28 NOTE — Progress Notes (Addendum)
   Subjective:   This morning, patient reports that she feels well and denies any pain. When asked if she is having shortness of breath baseline, she believes this is roughly normal for her. She continues to wish to return home today.  Objective:  Vital signs in last 24 hours: Vitals:   09/27/20 1746 09/27/20 2127 09/28/20 0200 09/28/20 0535  BP:  (!) 120/57 (!) 142/58 (!) 127/56  Pulse:  (!) 108 (!) 110 (!) 109  Resp:  $Remo'20 20 20  'vkoKH$ Temp:  99.3 F (37.4 C) 98.2 F (36.8 C) 99.6 F (37.6 C)  TempSrc:  Oral Oral Oral  SpO2: 97% 96% 94% 92%  Weight:      Height:        Physical Exam Constitutional: no acute distress Head: atraumatic ENT: external ears normal Eyes: EOMI Cardiovascular: regular rate and rhythm, normal heart sounds Pulmonary: breathing through pursed lips, exam limited by habitus but has good air movement throughout Abdominal: flat Musculoskeletal: bilateral lower extremities are wrapped with ACE bandages, previously was noted to have chronic venous stasis changes with 2 ulcers on LLE which were not purulent  Skin: warm and dry Neurological: alert, no focal deficit Psychiatric: normal mood and affect  Assessment/Plan: [name] is a 70 y.o. female with hx of OHS on 3-4L chronically, HFpEF, morbid obesity, HTN, chronic ITP, chronic back pain presenting with fever, confusion, and dyspnea. Suspect sepsis secondary to chronic stasis dermatitis. Vital signs and WBC count now improving with Abx.  Active Problems:   Sepsis ([place])  Fever, tachycardia, tachypnea, leukocytosis Chronic venous stasis dermatitis, no purulence Concern for sepsis as she met SIRS criteria. SOFA score of 4, but all these points were for chronic problems, so sepsis is ruled out at this point. Febrile to 103.1 on admission, no fevers since. Tachycardia and tachypnea improving. Blood pressure has been stable though pulse pressure is wide, MAPs in 70s. LA negative x2. No clear infectious source,  suspect stasis dermatitis as source. UA negative. Chest imaging with some atelectasis, seems unlikely to be pneumonia with lack of respiratory symptoms and no focal opacity. Blood cultures pending.  - s/p 2 days of vanc and zosyn - start doxycycline and keflex, plan to complete 5 days total of Abx - Follow blood cultures, negative at 2 days - Wound care consult  Chronic hypoxic and hypercapnic respiratory failure Obesity hypoventilation syndrome Patient normally is on 3 to 4 L at home, requiring 4 L here. Feels that her breathing is at baseline, but she has some pursed lip breathing today - restart home Lasix $RemoveBe'80mg'aoOwWdeGt$  daily - start CPAP at night  Chronic ITP Platelet count of 128K on admission which is improved from 50-60k on last admission.  No signs of bleeding. - Daily CBC   Chronic Back and knee pain - prn tylenol and voltaren gel  Diet:  normal IVF:  Lactated ringers VTE:  lovenox Prior to Admission Living Arrangement:  home Anticipated Discharge Location:  Home vs SNF Barriers to Discharge:  Pending disposition plan Dispo: Medically stable for discharge  Andrew Au, MD 09/28/2020, 6:48 AM Pager: [phone] After 5pm on weekdays and 1pm on weekends: On Call pager [phone]

## 2020-09-28 NOTE — Evaluation (Signed)
Occupational Therapy Evaluation Patient Details Name: [name] MRN: [id number] DOB: April 01, 1951 Today's Date: 09/28/2020    History of Present Illness Pt is 70 yo female with PMH including chronic back pain after L1 fracture, chronic hypoxic/hypercarbic respiratory failure on 3-4 L [place], morbid obesity, HFpEF, chronic ITP, hypertension, recent COVID-19 pneumonia in October 2021. She presented after an episode of confusion and found to be febrile and dyspneic.  Pt admitted with sepsis related to lower extremity cellulitis.   Clinical Impression   Pt was ambulating short distances in her home and assisted for bathing, dressing and IADL. Information provided through chart review as pt is a poor historian. Pt presents with impaired cognition, significant pain and generalized weakness. She requires +2 max assist for bed level mobility, supervision for static sitting and declined attempt to stand with therapies today. She needs min to total assist for ADL. Recommending ST rehab in SNF. Will follow acutely.     Follow Up Recommendations  SNF;Supervision/Assistance - 24 hour    Equipment Recommendations  Hospital bed (hoyer lift)    Recommendations for Other Services       Precautions / Restrictions Precautions Precautions: Fall      Mobility Bed Mobility Overal bed mobility: Needs Assistance Bed Mobility: Rolling;Supine to Sit;Sit to Supine Rolling: Max assist;+2 for physical assistance   Supine to sit: +2 for physical assistance;Max assist Sit to supine: +2 for physical assistance;Max assist   General bed mobility comments: pt with minimal initiation of movement or ability to provide assistance, typically sleeps in a recliner at home    Transfers                 General transfer comment: declined attempts to stand    Balance Overall balance assessment: Needs assistance Sitting-balance support: No upper extremity supported Sitting balance-Leahy Scale: Fair Sitting  balance - Comments: supervision during grooming at EOB                                   ADL either performed or assessed with clinical judgement   ADL Overall ADL's : Needs assistance/impaired Eating/Feeding: Set up;Sitting Eating/Feeding Details (indicate cue type and reason): drinking water Grooming: Oral care;Sitting;Set up;Brushing hair;Total assistance   Upper Body Bathing: Maximal assistance;Sitting   Lower Body Bathing: Total assistance;Bed level   Upper Body Dressing : Maximal assistance;Sitting   Lower Body Dressing: Total assistance;Bed level       Toileting- Clothing Manipulation and Hygiene: Total assistance;Bed level Toileting - Clothing Manipulation Details (indicate cue type and reason): pt with bowel incontinence             Vision Baseline Vision/History: Wears glasses Wears Glasses: At all times Patient Visual Report: No change from baseline       Perception     Praxis      Pertinent Vitals/Pain Pain Assessment: Faces Faces Pain Scale: Hurts whole lot Pain Location: Legs, back,  and generalized with any movement/touch Pain Descriptors / Indicators: Grimacing;Moaning Pain Intervention(s): Monitored during session;Repositioned     Hand Dominance Right   Extremity/Trunk Assessment Upper Extremity Assessment Upper Extremity Assessment: Generalized weakness   Lower Extremity Assessment Lower Extremity Assessment: Defer to PT evaluation RLE Deficits / Details: Limited by pain and edema.  Bil LE in wraps, with edema, and dry flakey skin.   ROM appear functional but does have some limitations due to pain and body habitus.  MMT: pt with  difficulty following commands and painful, appears to be at least 3/5 at knee and ankle and 1/5 in hips LLE Deficits / Details: Limited by pain and edema.  Bil LE in wraps, with edema, and dry flakey skin.   ROM appear functional but does have some limitations due to pain and body habitus.  MMT: pt with  difficulty following commands and painful, appears to be at least 3/5 at knee and ankle and 1/5 in hips   Cervical / Trunk Assessment Cervical / Trunk Assessment: Kyphotic (obesity)   Communication Communication Communication: No difficulties   Cognition Arousal/Alertness: Awake/alert Behavior During Therapy: Flat affect Overall Cognitive Status: Impaired/Different from baseline Area of Impairment: Orientation;Attention;Memory;Following commands;Awareness;Problem solving                 Orientation Level: Disoriented to;Time;Situation Current Attention Level: Sustained Memory: Decreased short-term memory Following Commands: Follows one step commands with increased time   Awareness: Intellectual Problem Solving: Slow processing;Decreased initiation;Difficulty sequencing;Requires verbal cues;Requires tactile cues     General Comments       Exercises     Shoulder Instructions      Home Living Family/patient expects to be discharged to:: Private residence Living Arrangements: Children (daughter) Available Help at Discharge: Family;Available 24 hours/day;Personal care attendant Type of Home: House Home Access: Stairs to enter [employer] of Steps: 3 Entrance Stairs-Rails: Can reach both Home Layout: One level     Bathroom Shower/Tub: Teacher, early years/pre: Standard     Home Equipment: Cane - single point;Bedside commode;Other (comment) (02)   Additional Comments: pt is a poor historian, information obtained from prior hospitalization      Prior Functioning/Environment Level of Independence: Needs assistance  Gait / Transfers Assistance Needed: Reports using a walker to ambulate and has a lift chair for sit to stand.  Sleeps in lift chair. ADL's / Homemaking Assistance Needed: pt assisted for ADL and IADL, unable to offer specifics due to confusion   Comments: PLOF obtained from prior visit        OT Problem List: Decreased  strength;Decreased activity tolerance;Impaired balance (sitting and/or standing);Decreased coordination;Decreased cognition;Decreased safety awareness;Decreased knowledge of use of DME or AE;Obesity;Impaired UE functional use;Pain      OT Treatment/Interventions: Self-care/ADL training;DME and/or AE instruction;Therapeutic activities;Patient/family education;Balance training;Cognitive remediation/compensation    OT Goals(Current goals can be found in the care plan section) Acute Rehab OT Goals Patient Stated Goal: decrease pain OT Goal Formulation: Patient unable to participate in goal setting Potential to Achieve Goals: Fair ADL Goals Pt Will Perform Upper Body Bathing: with min assist;sitting Pt Will Perform Upper Body Dressing: with supervision;sitting Pt Will Transfer to Toilet: with mod assist;stand pivot transfer;bedside commode Pt Will Perform Toileting - Clothing Manipulation and hygiene: with mod assist;sit to/from stand Additional ADL Goal #1: Pt will demonstrate selective attention during ADL and mobility.  OT Frequency: Min 2X/week   Barriers to D/C:            Co-evaluation PT/OT/SLP Co-Evaluation/Treatment: Yes Reason for Co-Treatment: For patient/therapist safety PT goals addressed during session: Mobility/safety with mobility;Balance OT goals addressed during session: ADL's and self-care;Strengthening/ROM      AM-PAC OT "6 Clicks" Daily Activity     Outcome Measure Help from another person eating meals?: A Little Help from another person taking care of personal grooming?: A Little Help from another person toileting, which includes using toliet, bedpan, or urinal?: Total Help from another person bathing (including washing, rinsing, drying)?: A Lot Help from another person  to put on and taking off regular upper body clothing?: A Lot Help from another person to put on and taking off regular lower body clothing?: Total 6 Click Score: 12   End of Session Equipment  Utilized During Treatment: Oxygen (4L) Nurse Communication: Mobility status;Other (comment) (NT to replace purewick)  Activity Tolerance: Patient limited by pain;Patient limited by fatigue Patient left: in bed;with call bell/phone within reach;with bed alarm set  OT Visit Diagnosis: Muscle weakness (generalized) (M62.81);Other symptoms and signs involving cognitive function;Pain                Time: 1937-9024 OT Time Calculation (min): 29 min Charges:  OT General Charges $OT Visit: 1 Visit OT Evaluation $OT Eval Moderate Complexity: 1 Mod  Nestor Lewandowsky, OTR/L Acute Rehabilitation Services Pager: [phone] Office: [phone] Malka So 09/28/2020, 12:26 PM

## 2020-09-28 NOTE — Consult Note (Signed)
   [facility] CM Inpatient Consult   09/28/2020  [name] 1951/04/12 [id number]   Patient screened for high risk score for unplanned readmission and 4 hospitalizations within past 6 months. Chart reviewed to assess for potential [place] [facility] community service needs.  Per review, patient is being recommended for a skilled nursing facility level of care. Will continue to follow for progression and disposition plans.     Of note, [facility] Care Management services does not replace or interfere with any services that are arranged by inpatient case management or social work.  Netta Cedars, MSN, [facility] Liaison Nurse Mobile Phone [phone]  Toll free office [phone]

## 2020-09-28 NOTE — Progress Notes (Signed)
MD paged to make aware of pt not taken PO meds at this time. MD stated will change meds.

## 2020-09-28 NOTE — Care Management Important Message (Signed)
Important Message  Patient Details  Name: [name] MRN: [id number] Date of Birth: Mar 09, 1951   Medicare Important Message Given:  Yes     Shatonia Hoots [address]ine Circle 09/28/2020, 3:39 PM

## 2020-09-28 NOTE — Progress Notes (Signed)
Dr Bridgett Larsson informed of Lab results of Pco2 92.3 and PH of 7.17. States e will call respiratory at this time

## 2020-09-29 ENCOUNTER — Inpatient Hospital Stay (HOSPITAL_COMMUNITY): Payer: Medicare PPO

## 2020-09-29 LAB — BASIC METABOLIC PANEL
Anion gap: 10 (ref 5–15)
BUN: 11 mg/dL (ref 8–23)
CO2: 31 mmol/L (ref 22–32)
Calcium: 8.8 mg/dL — ABNORMAL LOW (ref 8.9–10.3)
Chloride: 100 mmol/L (ref 98–111)
Creatinine, Ser: 0.63 mg/dL (ref 0.44–1.00)
GFR, Estimated: >60 mL/min (ref >60)
Glucose, Bld: 97 mg/dL (ref 70–99)
Potassium: 4 mmol/L (ref 3.5–5.1)
Sodium: 141 mmol/L (ref 135–145)

## 2020-09-29 LAB — CBC
HCT: 32.4 % — ABNORMAL LOW (ref 36.0–46.0)
Hemoglobin: 9.4 g/dL — ABNORMAL LOW (ref 12.0–15.0)
MCH: 26.9 pg (ref 26.0–34.0)
MCHC: 29 g/dL — ABNORMAL LOW (ref 30.0–36.0)
MCV: 92.8 fL (ref 80.0–100.0)
Platelets: 76 10*3/uL — ABNORMAL LOW (ref 150–400)
RBC: 3.49 MIL/uL — ABNORMAL LOW (ref 3.87–5.11)
RDW: 15.6 % — ABNORMAL HIGH (ref 11.5–15.5)
WBC: 11.5 10*3/uL — ABNORMAL HIGH (ref 4.0–10.5)
nRBC: 0 % (ref 0.0–0.2)

## 2020-09-29 LAB — GLUCOSE, CAPILLARY: Glucose-Capillary: 86 mg/dL (ref 70–99)

## 2020-09-29 LAB — BLOOD GAS, ARTERIAL
Acid-Base Excess: 5.7 mmol/L — ABNORMAL HIGH (ref 0.0–2.0)
Bicarbonate: 32.9 mmol/L — ABNORMAL HIGH (ref 20.0–28.0)
Drawn by: 31101
FIO2: 50
O2 Saturation: 97.9 %
Patient temperature: 36.8
pCO2 arterial: 81 mmHg (ref 32.0–48.0)
pH, Arterial: 7.231 — ABNORMAL LOW (ref 7.350–7.450)
pO2, Arterial: 106 mmHg (ref 83.0–108.0)

## 2020-09-29 MED ORDER — SODIUM CHLORIDE 0.9 % IV SOLN
INTRAVENOUS | Status: DC | PRN
  Administered 2020-09-29: 250 mL via INTRAVENOUS

## 2020-09-29 MED ORDER — FUROSEMIDE 10 MG/ML IJ SOLN
80.0000 mg | Freq: Once | INTRAMUSCULAR | Status: AC
  Administered 2020-09-29: 80 mg via INTRAVENOUS
  Filled 2020-09-29: qty 8

## 2020-09-29 MED ORDER — CARVEDILOL 3.125 MG PO TABS
3.1250 mg | ORAL_TABLET | Freq: Two times a day (BID) | ORAL | Status: DC
  Administered 2020-09-29 – 2020-09-30 (×3): 3.125 mg via ORAL
  Filled 2020-09-29 (×3): qty 1

## 2020-09-29 NOTE — Progress Notes (Signed)
   Subjective:   She is still on bipap from yesterday evening. She denies shortness of breath or pain. She is alert and oriented x3 although keep repeating herself and answering some yes and no questions inappropriately.    Objective:  Vital signs in last 24 hours: Vitals:   09/29/20 0000 09/29/20 0307 09/29/20 0400 09/29/20 0515  BP: 115/60  123/67 116/63  Pulse: 100 96 99 (!) 101  Resp: 14 14 (!) 22 18  Temp: 98 F (36.7 C)  98.3 F (36.8 C) 98.6 F (37 C)  TempSrc: Axillary  Axillary   SpO2: 98% 95% 96% 96%  Weight:    89.6 kg  Height:        Constitution: NAD, appears stated age, acutely ill appearing  HENT: [place]/AT, bipap in place  Cardio: tachycardic, no m/r/g, lower extremities wrapped  Respiratory: distant lung sounds, no w/r/r Abdominal: NTTP, soft, non-distended MSK: moving all extremities Neuro: Maldonado&ox3, repetitive, abnormal affect  Skin: lower extremity dressings in place   Assessment/Plan:  Active Problems:   Sepsis ([place])  [name] is Maldonado 70yo female with PMH of chronic ITP, HTN, chronic back pain 2/2 L1 fx, chronic hypoxi/hypercarbic respiratory failure on 3-4L  at home, recent covid 19 infection, HFpEF, and obesity who presented with confusion, fever, and dyspnea.   Lower Extremity Cellulitis  Chronic Venous Statsis Dermatitis  Dressings changes yesterday. Leukocytosis improving. Febrile yesterday evening to 100.9.   - blood cx NGTD 2 days  - s/p vanc & zosyn 2 days, switched to doxycycline & keflex yesterday, today day 4/5 abx - appreciate wound care's assistance   Obesity Hypoventilation Syndrome  Acute on Chronic Hypercarbic and Hypoxic Respiratory Failure  ABG slightly improved after bipap, appears to have chronic acidosis and is near baseline. Desats to 70 with removal. She has been off her home torsemide 60 mg qd since admission 1/12.   - CXR  - lasix 80 mg IV now, resume home dose tomorrow  - continue home CPAP, BIPAP prn   Chronic ITP   Platelets 76 from 104 yesterday.   - trend CBC   Hypertension - continue home coreg  VTE: patient is refusing lovenox intermittently. SCDs ordered  IVF: none Diet: regular Code: full  Dispo: Anticipated discharge in approximately 1-2 days.   Evelyn Aul A, DO 09/29/2020, 6:45 AM Pager: [phone] After 5pm on weekdays and 1pm on weekends: On Call Pager: [phone]

## 2020-09-29 NOTE — Progress Notes (Signed)
Patient complained of mouth being dry and requested something to drink. RN removed BiPAP replaced with O2 to swab patient's mouth, O2 dropped to 83% within 2 mins. BiPAP replaced O2 returned to 100%.   RN will continue to monitor.

## 2020-09-29 NOTE — Progress Notes (Signed)
   09/29/20 1100  Assess: MEWS Score  Pulse Rate (!) 104  ECG Heart Rate (!) 105  Resp (!) 25  SpO2 98 %  Assess: MEWS Score  MEWS Temp 0  MEWS Systolic 0  MEWS Pulse 1  MEWS RR 1  MEWS LOC 0  MEWS Score 2  MEWS Score Color Yellow  Assess: if the MEWS score is Yellow or Red  Were vital signs taken at a resting state? Yes  Focused Assessment No change from prior assessment  Early Detection of Sepsis Score *See Row Information* High  MEWS guidelines implemented *See Row Information* No, vital signs rechecked  Document  Progress note created (see row info) Yes

## 2020-09-29 NOTE — Progress Notes (Addendum)
RN in to provide oral care BiPAP removed and replaced with 7L O2 via [place]. Patient tolerated well. O2 at 100% on 7L. BiPAP will remain off at this time. Respiratory notified. RN will notify MD.    1215: MD notified and states that O2 goal is 88%. O2 weaned to 4L patient O2 is 91-91%. Patient offered lunch at this time. However, refused and requested ice cream.   RN will continue to monitor patient.

## 2020-09-30 DIAGNOSIS — A419 Sepsis, unspecified organism: Secondary | ICD-10-CM | POA: Diagnosis not present

## 2020-09-30 DIAGNOSIS — I10 Essential (primary) hypertension: Secondary | ICD-10-CM | POA: Diagnosis not present

## 2020-09-30 DIAGNOSIS — L03116 Cellulitis of left lower limb: Secondary | ICD-10-CM | POA: Diagnosis not present

## 2020-09-30 LAB — BASIC METABOLIC PANEL
Anion gap: 12 (ref 5–15)
BUN: 15 mg/dL (ref 8–23)
CO2: 34 mmol/L — ABNORMAL HIGH (ref 22–32)
Calcium: 8.8 mg/dL — ABNORMAL LOW (ref 8.9–10.3)
Chloride: 96 mmol/L — ABNORMAL LOW (ref 98–111)
Creatinine, Ser: 0.6 mg/dL (ref 0.44–1.00)
GFR, Estimated: >60 mL/min (ref >60)
Glucose, Bld: 93 mg/dL (ref 70–99)
Potassium: 3.4 mmol/L — ABNORMAL LOW (ref 3.5–5.1)
Sodium: 142 mmol/L (ref 135–145)

## 2020-09-30 LAB — CBC
HCT: 33.5 % — ABNORMAL LOW (ref 36.0–46.0)
Hemoglobin: 10.1 g/dL — ABNORMAL LOW (ref 12.0–15.0)
MCH: 27.1 pg (ref 26.0–34.0)
MCHC: 30.1 g/dL (ref 30.0–36.0)
MCV: 89.8 fL (ref 80.0–100.0)
Platelets: 84 10*3/uL — ABNORMAL LOW (ref 150–400)
RBC: 3.73 MIL/uL — ABNORMAL LOW (ref 3.87–5.11)
RDW: 14.9 % (ref 11.5–15.5)
WBC: 11.8 10*3/uL — ABNORMAL HIGH (ref 4.0–10.5)
nRBC: 0 % (ref 0.0–0.2)

## 2020-09-30 LAB — MAGNESIUM: Magnesium: 1.7 mg/dL (ref 1.7–2.4)

## 2020-09-30 MED ORDER — "XEROFORM PETROLAT GAUZE 5""X9"" EX MISC": 1.0000 "application " | Freq: Every day | CUTANEOUS | 0 refills | Status: DC

## 2020-09-30 MED ORDER — CYANOCOBALAMIN 1000 MCG PO TABS: 1000.0000 ug | ORAL_TABLET | Freq: Every day | ORAL | 2 refills | Status: DC

## 2020-09-30 MED ORDER — DOXYCYCLINE HYCLATE 100 MG PO TABS
100.0000 mg | ORAL_TABLET | Freq: Two times a day (BID) | ORAL | Status: AC
  Administered 2020-09-30: 100 mg via ORAL
  Filled 2020-09-30: qty 1

## 2020-09-30 MED ORDER — DOXYCYCLINE HYCLATE 100 MG PO TABS: 100.0000 mg | ORAL_TABLET | Freq: Two times a day (BID) | ORAL | Status: DC

## 2020-09-30 MED ORDER — TORSEMIDE 20 MG PO TABS
60.0000 mg | ORAL_TABLET | Freq: Every day | ORAL | Status: DC
  Administered 2020-09-30: 60 mg via ORAL
  Filled 2020-09-30: qty 3

## 2020-09-30 MED ORDER — DOXYCYCLINE HYCLATE 100 MG IV SOLR
100.0000 mg | Freq: Two times a day (BID) | INTRAVENOUS | Status: DC
Start: 2020-09-30 — End: 2020-09-30
  Filled 2020-09-30: qty 100

## 2020-09-30 MED ORDER — VITAMIN B-12 1000 MCG PO TABS
1000.0000 ug | ORAL_TABLET | Freq: Every day | ORAL | Status: DC
  Administered 2020-09-30: 1000 ug via ORAL
  Filled 2020-09-30: qty 1

## 2020-09-30 MED ORDER — "AQUACEL AG ADVANTAGE 4""X5"" EX PADS": 1.0000 "application " | MEDICATED_PAD | Freq: Every day | CUTANEOUS | 0 refills | Status: DC

## 2020-09-30 MED ORDER — POTASSIUM CHLORIDE CRYS ER 20 MEQ PO TBCR
40.0000 meq | EXTENDED_RELEASE_TABLET | Freq: Two times a day (BID) | ORAL | Status: DC
  Administered 2020-09-30: 40 meq via ORAL
  Filled 2020-09-30: qty 2

## 2020-09-30 NOTE — Progress Notes (Signed)
   Subjective:   She is feeling well today. No shortness of breath. Mentation appears improved today although Bipap was not started until 5:30am. She lives here in [place] with her daughter who helps take care of her. She uses CPAP nightly.    Objective:  Vital signs in last 24 hours: Vitals:   09/29/20 1155 09/29/20 1600 09/29/20 2000 09/30/20 0505  BP: 129/77 139/73 131/70 137/83  Pulse: 100 (!) 106 98 85  Resp: (!) 23 18 (!) 21 (!) 26  Temp: 98.6 F (37 C) 97.9 F (36.6 C) 97.9 F (36.6 C)   TempSrc: Oral Oral Oral   SpO2: 100% 97% 94% 97%  Weight:      Height:       Constitution: NAD, obese, supine in bed HENT: bipap in place  Cardio: RRR, no m/r/g, no LE edema  Respiratory: CTA, no w/r/r Abdominal: NTTP, soft, non-distended MSK: moving all extremities Neuro: normal affect, a&ox3 Skin: dressings in place up to proximal calves, no strike through   Assessment/Plan:  Active Problems:   Sepsis ([place])  Marietta Sikkema is a 70yo female with PMH of chronic ITP, HTN, chronic back pain 2/2 L1 fx, chronic hypoxi/hypercarbic respiratory failure on 3-4L [place] at home, recent covid 19 infection, HFpEF, and obesity who presented with confusion, fever, and dyspnea.   Lower Extremity Cellulitis  Chronic Venous Statsis Dermatitis  Afebrile over the last 24 hours. Leukocytosis stable at 11.4.   - blood cx NGTD 2 days  - s/p vanc & zosyn 2 days, switched to doxycycline & keflex yesterday, today day 5/5 abx - appreciate wound care's assistance   Obesity Hypoventilation Syndrome  Acute on Chronic Hypercarbic and Hypoxic Respiratory Failure  Mentation improved with bipap yesterday and this morning. Respiration also improved s/p restarting lasix last two days, increased dose yesterday. Tolerating 4L off bipap. She would benefit from SNF placement but has declined this as she lives at home with her daughter. Appreciate care management's assistance.   - continue home torsemide 60 mg  qd  - continue home CPAP qhs  - assess oxygen status after bipap, will otherwise be stable for discharge although this may be limited by current storm as patient is CPAP dependent with chronic respiratory acidosis and there is high risk for power outages at this time. Will consider safety of discharge.   Chronic ITP  Platelets stable.   - trend CBC   Hypertension - continue home coreg  Anemia Vitamin B12 Deficiency Chronic, B12 low.   - start vitamin B12 supplementation   VTE: patient is refusing lovenox intermittently. SCDs ordered  IVF: none Diet: regular Code: full  Dispo: Anticipated discharge today or tomorrow.   Quanika Solem A, DO 09/30/2020, 5:18 AM Pager: [phone] After 5pm on weekdays and 1pm on weekends: On Call Pager: [phone]

## 2020-09-30 NOTE — Progress Notes (Signed)
Patient's glasses found in room after she left. The glasses have the patient's sticker on them and are at the nurses station. The family has been notified. They will be here within the next day or two, weather permitting, to pick them up.

## 2020-09-30 NOTE — Progress Notes (Signed)
Pt placed on BI pap

## 2020-09-30 NOTE — TOC Transition Note (Addendum)
Transition of Care (TOC) - CM/SW Discharge Note Marvetta Gibbons RN, BSN Transitions of Care Unit 4E- RN Case Manager See Treatment Team for direct phone # Weekend coverage for [place]   Patient Details  Name: [name] MRN: [id number] Date of Birth: Jun 07, 1951  Transition of Care [facility]) CM/SW Contact:  Dawayne Patricia, RN Phone Number: 09/30/2020, 12:57 PM   Clinical Narrative:    Notified by MD that pt stable for transition home today, will need PTAR transport home, pt has refused SNF placement. [place] services to resume with [facility]- for HHRN/PT/aide needs.  Per MD daughter is aware of discharge today, and PTAR transportation will be arranged. Per MD "The daughter said that transport should be fine but they need to come in through [place] off [address] and not the other way as it's a steep entrance. "- due to the winter weather today.   Have updated both bedside RN and CSW as well as sent msg to [facility] regarding transition home today for restart of [facility] services.  1640- notified by bedside RN that pt states she is active with [facility]- call made to weekend on calls service to verify status- confirmed pt is active with them for [facility] services- [facility] also states pt was recently active with them and had just been discharged from [facility] services per Amy with [facility]- pt wants to stay with [facility] at this time- have let [facility] know- and [facility] is aware of new orders for resumption of services.    Final next level of care: [place] Barriers to Discharge: No Barriers Identified   Patient Goals and CMS Choice Patient states their goals for this hospitalization and ongoing recovery are:: to return tohome CMS Medicare.gov Compare Post Acute Care list provided to:: Patient Choice offered to / list presented to : Patient  Discharge Placement               Home with [facility]        Discharge Plan and Services   Discharge Planning Services: CM Consult Post Acute Care Choice: Home  Health          DME Arranged: N/A         HH Arranged: RN,PT,Nurse's Aide,Refused SNF HH Agency: [facility] Date [place]: 09/28/20 Time [place]: [id number] Representative spoke with at [place]: [place] ([place]) Interventions     Readmission Risk Interventions Readmission Risk Prevention Plan 09/30/2020  Transportation Screening Complete  Medication Review Press photographer) Complete  PCP or Specialist appointment within 3-5 days of discharge Complete  HRI or [place] Complete  SW Recovery Care/Counseling Consult Complete  [place] Patient Refused  Some recent data might be hidden

## 2020-10-01 LAB — CULTURE, BLOOD (ROUTINE X 2)
Culture: NO GROWTH
Culture: NO GROWTH
Special Requests: ADEQUATE

## 2020-10-02 ENCOUNTER — Other Ambulatory Visit: Payer: Self-pay | Admitting: Student

## 2020-10-02 ENCOUNTER — Other Ambulatory Visit: Payer: Self-pay | Admitting: Internal Medicine

## 2020-10-02 DIAGNOSIS — U071 COVID-19: Secondary | ICD-10-CM | POA: Diagnosis not present

## 2020-10-02 DIAGNOSIS — L97821 Non-pressure chronic ulcer of other part of left lower leg limited to breakdown of skin: Secondary | ICD-10-CM | POA: Diagnosis not present

## 2020-10-02 DIAGNOSIS — L89322 Pressure ulcer of left buttock, stage 2: Secondary | ICD-10-CM | POA: Diagnosis not present

## 2020-10-02 DIAGNOSIS — I872 Venous insufficiency (chronic) (peripheral): Secondary | ICD-10-CM | POA: Diagnosis not present

## 2020-10-02 DIAGNOSIS — I130 Hypertensive heart and chronic kidney disease with heart failure and stage 1 through stage 4 chronic kidney disease, or unspecified chronic kidney disease: Secondary | ICD-10-CM | POA: Diagnosis not present

## 2020-10-02 DIAGNOSIS — L97811 Non-pressure chronic ulcer of other part of right lower leg limited to breakdown of skin: Secondary | ICD-10-CM | POA: Diagnosis not present

## 2020-10-02 DIAGNOSIS — J9621 Acute and chronic respiratory failure with hypoxia: Secondary | ICD-10-CM | POA: Diagnosis not present

## 2020-10-02 DIAGNOSIS — J9622 Acute and chronic respiratory failure with hypercapnia: Secondary | ICD-10-CM | POA: Diagnosis not present

## 2020-10-02 DIAGNOSIS — J1282 Pneumonia due to coronavirus disease 2019: Secondary | ICD-10-CM | POA: Diagnosis not present

## 2020-10-02 MED ORDER — POTASSIUM CHLORIDE ER 10 MEQ PO TBCR: 10.0000 meq | EXTENDED_RELEASE_TABLET | Freq: Every day | ORAL | 1 refills | Status: DC

## 2020-10-02 MED ORDER — CARVEDILOL 3.125 MG PO TABS: 3.1250 mg | ORAL_TABLET | Freq: Two times a day (BID) | ORAL | 0 refills | Status: DC

## 2020-10-02 MED ORDER — FUROSEMIDE 80 MG PO TABS
80.0000 mg | ORAL_TABLET | ORAL | 1 refills | Status: DC
Start: 2020-10-02 — End: 2021-01-21

## 2020-10-02 NOTE — Telephone Encounter (Signed)
Magda Paganini, RN with [facility] called stating patient has no meds in the home. Requesting refills on carvedilol, lasix, and potassium at [facility] on [address]. Hubbard Hartshorn, BSN, RN-BC

## 2020-10-03 ENCOUNTER — Telehealth: Payer: Self-pay

## 2020-10-03 DIAGNOSIS — N183 Chronic kidney disease, stage 3 unspecified: Secondary | ICD-10-CM | POA: Diagnosis not present

## 2020-10-03 DIAGNOSIS — J9621 Acute and chronic respiratory failure with hypoxia: Secondary | ICD-10-CM | POA: Diagnosis not present

## 2020-10-03 DIAGNOSIS — L97818 Non-pressure chronic ulcer of other part of right lower leg with other specified severity: Secondary | ICD-10-CM | POA: Diagnosis not present

## 2020-10-03 DIAGNOSIS — J9622 Acute and chronic respiratory failure with hypercapnia: Secondary | ICD-10-CM | POA: Diagnosis not present

## 2020-10-03 DIAGNOSIS — A419 Sepsis, unspecified organism: Secondary | ICD-10-CM | POA: Diagnosis not present

## 2020-10-03 DIAGNOSIS — I130 Hypertensive heart and chronic kidney disease with heart failure and stage 1 through stage 4 chronic kidney disease, or unspecified chronic kidney disease: Secondary | ICD-10-CM | POA: Diagnosis not present

## 2020-10-03 DIAGNOSIS — I5033 Acute on chronic diastolic (congestive) heart failure: Secondary | ICD-10-CM | POA: Diagnosis not present

## 2020-10-03 DIAGNOSIS — I872 Venous insufficiency (chronic) (peripheral): Secondary | ICD-10-CM | POA: Diagnosis not present

## 2020-10-03 DIAGNOSIS — L97828 Non-pressure chronic ulcer of other part of left lower leg with other specified severity: Secondary | ICD-10-CM | POA: Diagnosis not present

## 2020-10-03 NOTE — Telephone Encounter (Signed)
We can have her schedule a telehealth visit at her earliest convenience.

## 2020-10-03 NOTE — Telephone Encounter (Signed)
I would recommend patient having an in-person HFU. She has not been seen in clinic since July 2021 and given her multiple comorbidities, would benefit from an in person evaluation. It also appears that CCM has been trying to contact his patient but have been unable to reach her. Will need to have her update records with a working phone number so that we can have CCM address other socioeconomic determinants of her health.

## 2020-10-03 NOTE — Telephone Encounter (Signed)
Spoke with the pt.  She is refusing an in-person Visit.  Pt's states, "I am not coming to an office visit, why can't I do a [place] visit.  Please let the Dr. Gwyndolyn Kaufman I am not coming, I was  just seen in the Hospital and I am not chancing getting COVID to come see a Dr. again."   Patient was also aware that Transportation Arrangements could be made by [facility] and she still declined.  Please advise.

## 2020-10-03 NOTE — Telephone Encounter (Signed)
Received TC from daughter, [name], she was asking about patient's carvedilol RX.  This RX was sent via [place] yesterday, daughter was informed. Daughter also informed patient past due for f/u appt and will also need HFU appt soon.  She is asking if this can be done by Telehealth due to transportation issues.  Daughter informed HFU are usually in person, but RN will forward request to PCP. Will also forward to [facility] to assist with transportation arrangements if this is needed. Thank you, SChaplin, RN,BSN

## 2020-10-04 ENCOUNTER — Encounter: Payer: Self-pay | Admitting: Student

## 2020-10-04 ENCOUNTER — Ambulatory Visit (INDEPENDENT_AMBULATORY_CARE_PROVIDER_SITE_OTHER): Payer: Medicare PPO | Admitting: Student

## 2020-10-04 ENCOUNTER — Other Ambulatory Visit: Payer: Self-pay

## 2020-10-04 ENCOUNTER — Other Ambulatory Visit: Payer: Self-pay | Admitting: Student

## 2020-10-04 DIAGNOSIS — J9622 Acute and chronic respiratory failure with hypercapnia: Secondary | ICD-10-CM | POA: Diagnosis not present

## 2020-10-04 DIAGNOSIS — I5033 Acute on chronic diastolic (congestive) heart failure: Secondary | ICD-10-CM | POA: Diagnosis not present

## 2020-10-04 DIAGNOSIS — I872 Venous insufficiency (chronic) (peripheral): Secondary | ICD-10-CM | POA: Diagnosis not present

## 2020-10-04 DIAGNOSIS — I5032 Chronic diastolic (congestive) heart failure: Secondary | ICD-10-CM | POA: Diagnosis not present

## 2020-10-04 DIAGNOSIS — I130 Hypertensive heart and chronic kidney disease with heart failure and stage 1 through stage 4 chronic kidney disease, or unspecified chronic kidney disease: Secondary | ICD-10-CM | POA: Diagnosis not present

## 2020-10-04 DIAGNOSIS — L97828 Non-pressure chronic ulcer of other part of left lower leg with other specified severity: Secondary | ICD-10-CM | POA: Diagnosis not present

## 2020-10-04 DIAGNOSIS — J9621 Acute and chronic respiratory failure with hypoxia: Secondary | ICD-10-CM | POA: Diagnosis not present

## 2020-10-04 DIAGNOSIS — A419 Sepsis, unspecified organism: Secondary | ICD-10-CM | POA: Diagnosis not present

## 2020-10-04 DIAGNOSIS — N183 Chronic kidney disease, stage 3 unspecified: Secondary | ICD-10-CM | POA: Diagnosis not present

## 2020-10-04 DIAGNOSIS — E662 Morbid (severe) obesity with alveolar hypoventilation: Secondary | ICD-10-CM | POA: Diagnosis not present

## 2020-10-04 DIAGNOSIS — S81809D Unspecified open wound, unspecified lower leg, subsequent encounter: Secondary | ICD-10-CM

## 2020-10-04 DIAGNOSIS — L97818 Non-pressure chronic ulcer of other part of right lower leg with other specified severity: Secondary | ICD-10-CM | POA: Diagnosis not present

## 2020-10-04 MED ORDER — CARVEDILOL 3.125 MG PO TABS: 3.1250 mg | ORAL_TABLET | Freq: Two times a day (BID) | ORAL | 0 refills | Status: DC

## 2020-10-04 NOTE — Telephone Encounter (Signed)
30 day supply sent to pharmacy on 10/02/20.  New request is for 90-day supply Pt was scheduled to see pcp today at 0945, but has yet to show at the time of this message.Regenia Skeeter, Kenleigh Toback Cassady1/20/202210:07 AM

## 2020-10-04 NOTE — Progress Notes (Signed)
  [facility] Internal Medicine Residency Telephone Encounter Continuity Care Appointment  HPI:   This telephone encounter was created for [name] on 10/04/2020 for the following purpose/cc hospital follow up for recent admission 1/12-1/15 for acute on chronic hypoxic, hypercarbic respiratory failure and LE cellulitis. Please see problem-based assessment/plan for full details.    Past Medical History:  Past Medical History:  Diagnosis Date  . Arthritis    lumbar burst fx, osteoporosis  . Back pain 09/29/2013  . Blood dyscrasia   . Chest pain    denies  . Chronic kidney disease    passed stones spontaneously- 40 yrs. ago   . Depression    states her current situation with activity limitations, she has a low spirit at times.   Marland Kitchen Dysrhythmia    occ tachycardia  . Fracture of vertebra, compression ([place])   . GERD (gastroesophageal reflux disease)    occ  . History of kidney stones   . HTN (hypertension)   . ITP (idiopathic thrombocytopenic purpura) 08/29/2013  . Leukocytosis, unspecified 09/05/2013  . Muscle cramp 11/16/2013  . Tachycardia    occ upon exersion, and when put on bp med      ROS:   Positive for: fatigue, bilateral lower extremity swelling (chronic), loose stools  Negative for: fevers, chills, nausea, vomiting, confusion, worsening SOB, cough  All others negative except as noted in assessment/plan   Assessment / Plan / Recommendations:   Please see A&P under problem oriented charting for assessment of the patient's acute and chronic medical conditions.   As always, pt is advised that if symptoms worsen or new symptoms arise, they should go to an urgent care facility or to to ER for further evaluation.   Consent and Medical Decision Making:   Patient discussed with Dr. Philipp Ovens.   This is a telephone encounter between [name] and [name] on 10/04/2020 for hospital follow up. The visit was conducted with the patient located at home  and [name] at [facility]. The patient's identity was confirmed using their DOB and current address. The patient has consented to being evaluated through a telephone encounter and understands the associated risks (an examination cannot be done and the patient may need to come in for an appointment) / benefits (allows the patient to remain at home, decreasing exposure to coronavirus). I personally spent 25 minutes on medical discussion.    Evelyn Bennett, MD 10/04/2020, 12:24 PM Pager: [phone]

## 2020-10-05 NOTE — Telephone Encounter (Signed)
Telehealth completed on 10/04/2020.

## 2020-10-05 NOTE — Assessment & Plan Note (Signed)
[name] was admitted to the hospital 1/12-1/15 for acute on chronic hypoxic, hypercapneic respiratory failure secondary to OHS and has been admitted multiple times recently for the same. During her admissions, she has been found to desaturate, becoming lethargic and confused without BIPAP at night. She was discharged on 4L [place] which she states she continues to use 24/7. She endorses tiredness stating she has been sleeping more, although denies worsening SOB or confusion. She is unsure whether she has a CPAP or BIPAP at home, although states it is currently broken and will not turn on and has not been using this.   - Continue supplemental oxygen as needed to keep pulse oxygen >92%  - Will try to get patient new BIPAP for home use

## 2020-10-05 NOTE — Assessment & Plan Note (Signed)
Ms. Galer was hospitalized 1/12-1/15 for cellulitis due to chronic venous stasis. She was treated with vancomycin and cefepime, transitioned to PO doxycycline and Ancef and finished her ABX course in the hospital. She states home health has been to her home although have not yet changed her dressings. She is unable to comment on her wounds as they are covered in bandages although denies obvious drainage, fevers, chills, nausea, vomiting.   - Continue home health biweekly dressing changes

## 2020-10-05 NOTE — Assessment & Plan Note (Signed)
[name] has had long-standing troubles with significant bilateral lower extremity swelling, acute on chronic venous stasis changes, and hypoxia that have worsened over the past year. She had been restarted on Lasix 80mg  daily following her discharge from the hospital 1/15, although states she has significant immobility issues and her family, who picks up her prescriptions, has been unable to do so as of yet. HH PT stopped by her home and were concerned she had not yet received her medications. Attempts were made at having [name] come in for in person [facility] appointments, although she states that she is unable to bend her knees or get into a car for transportation to [facility] due to significant lower extremity swelling. She initially denied fatigue but came to realize that she fatigues easily when walking, and does not leave her home.   Warned patient that without being able to see her in person in [facility], that it would be unsafe to continue prescribing her medications that require careful monitoring given her significant medical illnesses. She was informed that she will need to make an in-person appointment by March 1st, 2022 or she will be dismissed from [facility].   - Will consult CCM to see if we can assist in transportation needs to clinic - Informed her of the importance of taking Lasix 80mg  daily and encouraged her to monitor her weights daily  - Will send official warning letter to patient to maintain clear communications

## 2020-10-08 ENCOUNTER — Telehealth: Payer: Self-pay | Admitting: Internal Medicine

## 2020-10-08 DIAGNOSIS — N183 Chronic kidney disease, stage 3 unspecified: Secondary | ICD-10-CM | POA: Diagnosis not present

## 2020-10-08 DIAGNOSIS — J9622 Acute and chronic respiratory failure with hypercapnia: Secondary | ICD-10-CM | POA: Diagnosis not present

## 2020-10-08 DIAGNOSIS — I872 Venous insufficiency (chronic) (peripheral): Secondary | ICD-10-CM | POA: Diagnosis not present

## 2020-10-08 DIAGNOSIS — L97828 Non-pressure chronic ulcer of other part of left lower leg with other specified severity: Secondary | ICD-10-CM | POA: Diagnosis not present

## 2020-10-08 DIAGNOSIS — J9621 Acute and chronic respiratory failure with hypoxia: Secondary | ICD-10-CM | POA: Diagnosis not present

## 2020-10-08 DIAGNOSIS — L97818 Non-pressure chronic ulcer of other part of right lower leg with other specified severity: Secondary | ICD-10-CM | POA: Diagnosis not present

## 2020-10-08 DIAGNOSIS — I5033 Acute on chronic diastolic (congestive) heart failure: Secondary | ICD-10-CM | POA: Diagnosis not present

## 2020-10-08 DIAGNOSIS — I130 Hypertensive heart and chronic kidney disease with heart failure and stage 1 through stage 4 chronic kidney disease, or unspecified chronic kidney disease: Secondary | ICD-10-CM | POA: Diagnosis not present

## 2020-10-08 DIAGNOSIS — A419 Sepsis, unspecified organism: Secondary | ICD-10-CM | POA: Diagnosis not present

## 2020-10-08 NOTE — Telephone Encounter (Signed)
Spoke with Melissa at [facility]  She states pt was prescribed trilogy vent while in hospital and has had for 3 months and only used it a couple of hours  She is asking if they need a d/c order for this  Dr Vaughan Browner, can you please advise? I see that you seen pt in hospital briefly She has had no f/u here in the office and is not scheduled to

## 2020-10-08 NOTE — Progress Notes (Signed)
Internal Medicine Clinic Attending  Case discussed with Dr. Konrad Penta  At the time of the visit.  We reviewed the resident's history and exam and pertinent patient test results.  I agree with the assessment, diagnosis, and plan of care documented in the resident's note.   Unfortunately, patient has not been to clinic for an in person appointment since 2020, despite multiple hospital admissions and medical problems that cannot be safely managed over the phone. She was seen in person two years ago to establish care with [place], but has only had tele health visits with since. I will discuss her case with our medical director for recommendations on possible dismissal. She says she cannot come in / refuses to come in due to mobility issues. We will need to see if there are any barriers we can help address, appreciate CCM assistance.

## 2020-10-10 DIAGNOSIS — L97818 Non-pressure chronic ulcer of other part of right lower leg with other specified severity: Secondary | ICD-10-CM | POA: Diagnosis not present

## 2020-10-10 DIAGNOSIS — I5033 Acute on chronic diastolic (congestive) heart failure: Secondary | ICD-10-CM | POA: Diagnosis not present

## 2020-10-10 DIAGNOSIS — J9621 Acute and chronic respiratory failure with hypoxia: Secondary | ICD-10-CM | POA: Diagnosis not present

## 2020-10-10 DIAGNOSIS — L97828 Non-pressure chronic ulcer of other part of left lower leg with other specified severity: Secondary | ICD-10-CM | POA: Diagnosis not present

## 2020-10-10 DIAGNOSIS — N183 Chronic kidney disease, stage 3 unspecified: Secondary | ICD-10-CM | POA: Diagnosis not present

## 2020-10-10 DIAGNOSIS — J9622 Acute and chronic respiratory failure with hypercapnia: Secondary | ICD-10-CM | POA: Diagnosis not present

## 2020-10-10 DIAGNOSIS — I130 Hypertensive heart and chronic kidney disease with heart failure and stage 1 through stage 4 chronic kidney disease, or unspecified chronic kidney disease: Secondary | ICD-10-CM | POA: Diagnosis not present

## 2020-10-10 DIAGNOSIS — A419 Sepsis, unspecified organism: Secondary | ICD-10-CM | POA: Diagnosis not present

## 2020-10-10 DIAGNOSIS — I872 Venous insufficiency (chronic) (peripheral): Secondary | ICD-10-CM | POA: Diagnosis not present

## 2020-10-11 DIAGNOSIS — I872 Venous insufficiency (chronic) (peripheral): Secondary | ICD-10-CM | POA: Diagnosis not present

## 2020-10-11 DIAGNOSIS — N183 Chronic kidney disease, stage 3 unspecified: Secondary | ICD-10-CM | POA: Diagnosis not present

## 2020-10-11 DIAGNOSIS — I5033 Acute on chronic diastolic (congestive) heart failure: Secondary | ICD-10-CM | POA: Diagnosis not present

## 2020-10-11 DIAGNOSIS — J9621 Acute and chronic respiratory failure with hypoxia: Secondary | ICD-10-CM | POA: Diagnosis not present

## 2020-10-11 DIAGNOSIS — I130 Hypertensive heart and chronic kidney disease with heart failure and stage 1 through stage 4 chronic kidney disease, or unspecified chronic kidney disease: Secondary | ICD-10-CM | POA: Diagnosis not present

## 2020-10-11 DIAGNOSIS — J9622 Acute and chronic respiratory failure with hypercapnia: Secondary | ICD-10-CM | POA: Diagnosis not present

## 2020-10-11 DIAGNOSIS — L97828 Non-pressure chronic ulcer of other part of left lower leg with other specified severity: Secondary | ICD-10-CM | POA: Diagnosis not present

## 2020-10-11 DIAGNOSIS — A419 Sepsis, unspecified organism: Secondary | ICD-10-CM | POA: Diagnosis not present

## 2020-10-11 DIAGNOSIS — L97818 Non-pressure chronic ulcer of other part of right lower leg with other specified severity: Secondary | ICD-10-CM | POA: Diagnosis not present

## 2020-10-15 DIAGNOSIS — I872 Venous insufficiency (chronic) (peripheral): Secondary | ICD-10-CM | POA: Diagnosis not present

## 2020-10-15 DIAGNOSIS — I5033 Acute on chronic diastolic (congestive) heart failure: Secondary | ICD-10-CM | POA: Diagnosis not present

## 2020-10-15 DIAGNOSIS — J9621 Acute and chronic respiratory failure with hypoxia: Secondary | ICD-10-CM | POA: Diagnosis not present

## 2020-10-15 DIAGNOSIS — N183 Chronic kidney disease, stage 3 unspecified: Secondary | ICD-10-CM | POA: Diagnosis not present

## 2020-10-15 DIAGNOSIS — A419 Sepsis, unspecified organism: Secondary | ICD-10-CM | POA: Diagnosis not present

## 2020-10-15 DIAGNOSIS — I130 Hypertensive heart and chronic kidney disease with heart failure and stage 1 through stage 4 chronic kidney disease, or unspecified chronic kidney disease: Secondary | ICD-10-CM | POA: Diagnosis not present

## 2020-10-15 DIAGNOSIS — J9622 Acute and chronic respiratory failure with hypercapnia: Secondary | ICD-10-CM | POA: Diagnosis not present

## 2020-10-15 DIAGNOSIS — L97818 Non-pressure chronic ulcer of other part of right lower leg with other specified severity: Secondary | ICD-10-CM | POA: Diagnosis not present

## 2020-10-15 DIAGNOSIS — L97828 Non-pressure chronic ulcer of other part of left lower leg with other specified severity: Secondary | ICD-10-CM | POA: Diagnosis not present

## 2020-10-16 NOTE — Telephone Encounter (Signed)
Called and spoke to pt. Pt irritated I called for schedule an appt. Pt states the reasons she cant come to office is she doesn't have transportation, she doesn't want to leave the house, she's on O2, and it was too early to call pt for such a request. Informed pt we could help arrange for transportation. Pt refused to come to office for visit and states she has just stopped using the vent. Advised pt to call back if she should decide to schedule an appt.   Will forward to Dr. Vaughan Browner as Jul[place].

## 2020-10-16 NOTE — Telephone Encounter (Signed)
Please arrange post hospital clinic visit to [place]

## 2020-10-17 DIAGNOSIS — I5033 Acute on chronic diastolic (congestive) heart failure: Secondary | ICD-10-CM | POA: Diagnosis not present

## 2020-10-17 DIAGNOSIS — I872 Venous insufficiency (chronic) (peripheral): Secondary | ICD-10-CM | POA: Diagnosis not present

## 2020-10-17 DIAGNOSIS — J9622 Acute and chronic respiratory failure with hypercapnia: Secondary | ICD-10-CM | POA: Diagnosis not present

## 2020-10-17 DIAGNOSIS — I130 Hypertensive heart and chronic kidney disease with heart failure and stage 1 through stage 4 chronic kidney disease, or unspecified chronic kidney disease: Secondary | ICD-10-CM | POA: Diagnosis not present

## 2020-10-17 DIAGNOSIS — J9621 Acute and chronic respiratory failure with hypoxia: Secondary | ICD-10-CM | POA: Diagnosis not present

## 2020-10-17 DIAGNOSIS — A419 Sepsis, unspecified organism: Secondary | ICD-10-CM | POA: Diagnosis not present

## 2020-10-17 DIAGNOSIS — N183 Chronic kidney disease, stage 3 unspecified: Secondary | ICD-10-CM | POA: Diagnosis not present

## 2020-10-17 DIAGNOSIS — L97818 Non-pressure chronic ulcer of other part of right lower leg with other specified severity: Secondary | ICD-10-CM | POA: Diagnosis not present

## 2020-10-17 DIAGNOSIS — L97828 Non-pressure chronic ulcer of other part of left lower leg with other specified severity: Secondary | ICD-10-CM | POA: Diagnosis not present

## 2020-10-18 DIAGNOSIS — J9622 Acute and chronic respiratory failure with hypercapnia: Secondary | ICD-10-CM | POA: Diagnosis not present

## 2020-10-18 DIAGNOSIS — J9621 Acute and chronic respiratory failure with hypoxia: Secondary | ICD-10-CM | POA: Diagnosis not present

## 2020-10-18 DIAGNOSIS — A419 Sepsis, unspecified organism: Secondary | ICD-10-CM | POA: Diagnosis not present

## 2020-10-18 DIAGNOSIS — I5033 Acute on chronic diastolic (congestive) heart failure: Secondary | ICD-10-CM | POA: Diagnosis not present

## 2020-10-18 DIAGNOSIS — N183 Chronic kidney disease, stage 3 unspecified: Secondary | ICD-10-CM | POA: Diagnosis not present

## 2020-10-18 DIAGNOSIS — I130 Hypertensive heart and chronic kidney disease with heart failure and stage 1 through stage 4 chronic kidney disease, or unspecified chronic kidney disease: Secondary | ICD-10-CM | POA: Diagnosis not present

## 2020-10-18 DIAGNOSIS — L97818 Non-pressure chronic ulcer of other part of right lower leg with other specified severity: Secondary | ICD-10-CM | POA: Diagnosis not present

## 2020-10-18 DIAGNOSIS — L97828 Non-pressure chronic ulcer of other part of left lower leg with other specified severity: Secondary | ICD-10-CM | POA: Diagnosis not present

## 2020-10-18 DIAGNOSIS — I872 Venous insufficiency (chronic) (peripheral): Secondary | ICD-10-CM | POA: Diagnosis not present

## 2020-10-19 DIAGNOSIS — R269 Unspecified abnormalities of gait and mobility: Secondary | ICD-10-CM | POA: Diagnosis not present

## 2020-10-19 DIAGNOSIS — I5033 Acute on chronic diastolic (congestive) heart failure: Secondary | ICD-10-CM | POA: Diagnosis not present

## 2020-10-19 DIAGNOSIS — E662 Morbid (severe) obesity with alveolar hypoventilation: Secondary | ICD-10-CM | POA: Diagnosis not present

## 2020-10-19 DIAGNOSIS — J9611 Chronic respiratory failure with hypoxia: Secondary | ICD-10-CM | POA: Diagnosis not present

## 2020-10-19 DIAGNOSIS — J9610 Chronic respiratory failure, unspecified whether with hypoxia or hypercapnia: Secondary | ICD-10-CM | POA: Diagnosis not present

## 2020-10-22 ENCOUNTER — Telehealth: Payer: Self-pay | Admitting: Pulmonary Disease

## 2020-10-22 DIAGNOSIS — L97828 Non-pressure chronic ulcer of other part of left lower leg with other specified severity: Secondary | ICD-10-CM | POA: Diagnosis not present

## 2020-10-22 DIAGNOSIS — I872 Venous insufficiency (chronic) (peripheral): Secondary | ICD-10-CM | POA: Diagnosis not present

## 2020-10-22 DIAGNOSIS — J9622 Acute and chronic respiratory failure with hypercapnia: Secondary | ICD-10-CM | POA: Diagnosis not present

## 2020-10-22 DIAGNOSIS — J9621 Acute and chronic respiratory failure with hypoxia: Secondary | ICD-10-CM | POA: Diagnosis not present

## 2020-10-22 DIAGNOSIS — N183 Chronic kidney disease, stage 3 unspecified: Secondary | ICD-10-CM | POA: Diagnosis not present

## 2020-10-22 DIAGNOSIS — A419 Sepsis, unspecified organism: Secondary | ICD-10-CM | POA: Diagnosis not present

## 2020-10-22 DIAGNOSIS — I130 Hypertensive heart and chronic kidney disease with heart failure and stage 1 through stage 4 chronic kidney disease, or unspecified chronic kidney disease: Secondary | ICD-10-CM | POA: Diagnosis not present

## 2020-10-22 DIAGNOSIS — L97818 Non-pressure chronic ulcer of other part of right lower leg with other specified severity: Secondary | ICD-10-CM | POA: Diagnosis not present

## 2020-10-22 DIAGNOSIS — I5033 Acute on chronic diastolic (congestive) heart failure: Secondary | ICD-10-CM | POA: Diagnosis not present

## 2020-10-22 NOTE — Telephone Encounter (Signed)
Spoke with Melissa. I advised her that we still have not heard back from Dr. Vaughan Browner nor the patient. The patient has continued to not use the device. At this point, they will be able to come to patient's house and have her to sign a AMA form and pick up the machine. She was wanting to make sure Dr. Vaughan Browner didn't want her to switch to something else.   I tried reaching out the patient. Someone answered the phone but did not say anything.   Dr. Vaughan Browner, please advise. Thanks!

## 2020-10-23 NOTE — Telephone Encounter (Signed)
Attempted to call pt but unable to reach and unable to leave VM. Will try to call back later. 

## 2020-10-23 NOTE — Telephone Encounter (Signed)
Attempted to call pt again but line just rings and rings and no VM ever kicks in.

## 2020-10-23 NOTE — Telephone Encounter (Signed)
I have seen her only once in the hospital and I am unable to make a determination about the trilogy vent or if she needs any other mode of noninvasive ventilation at home as she has refused follow-up in the clinic.  So if the DME wants to have her sign AMA and pick up the machine I cannot make any recommendations for switching to something else with no information.  Marshell Garfinkel MD [facility]

## 2020-10-24 DIAGNOSIS — A419 Sepsis, unspecified organism: Secondary | ICD-10-CM | POA: Diagnosis not present

## 2020-10-24 DIAGNOSIS — I872 Venous insufficiency (chronic) (peripheral): Secondary | ICD-10-CM | POA: Diagnosis not present

## 2020-10-24 DIAGNOSIS — J9621 Acute and chronic respiratory failure with hypoxia: Secondary | ICD-10-CM | POA: Diagnosis not present

## 2020-10-24 DIAGNOSIS — J9622 Acute and chronic respiratory failure with hypercapnia: Secondary | ICD-10-CM | POA: Diagnosis not present

## 2020-10-24 DIAGNOSIS — N183 Chronic kidney disease, stage 3 unspecified: Secondary | ICD-10-CM | POA: Diagnosis not present

## 2020-10-24 DIAGNOSIS — I5033 Acute on chronic diastolic (congestive) heart failure: Secondary | ICD-10-CM | POA: Diagnosis not present

## 2020-10-24 DIAGNOSIS — L97828 Non-pressure chronic ulcer of other part of left lower leg with other specified severity: Secondary | ICD-10-CM | POA: Diagnosis not present

## 2020-10-24 DIAGNOSIS — L97818 Non-pressure chronic ulcer of other part of right lower leg with other specified severity: Secondary | ICD-10-CM | POA: Diagnosis not present

## 2020-10-24 DIAGNOSIS — I130 Hypertensive heart and chronic kidney disease with heart failure and stage 1 through stage 4 chronic kidney disease, or unspecified chronic kidney disease: Secondary | ICD-10-CM | POA: Diagnosis not present

## 2020-10-24 NOTE — Telephone Encounter (Signed)
Called and spoke with Melissa. She is aware of Dr. Matilde Bash response and verbalized understanding. She will try to reach patient to arrange pick up of vent.   Nothing further needed at time of call.

## 2020-10-24 NOTE — Telephone Encounter (Signed)
Error

## 2020-10-25 DIAGNOSIS — L97818 Non-pressure chronic ulcer of other part of right lower leg with other specified severity: Secondary | ICD-10-CM | POA: Diagnosis not present

## 2020-10-25 DIAGNOSIS — E662 Morbid (severe) obesity with alveolar hypoventilation: Secondary | ICD-10-CM | POA: Diagnosis not present

## 2020-10-25 DIAGNOSIS — J9621 Acute and chronic respiratory failure with hypoxia: Secondary | ICD-10-CM | POA: Diagnosis not present

## 2020-10-25 DIAGNOSIS — A419 Sepsis, unspecified organism: Secondary | ICD-10-CM | POA: Diagnosis not present

## 2020-10-25 DIAGNOSIS — R269 Unspecified abnormalities of gait and mobility: Secondary | ICD-10-CM | POA: Diagnosis not present

## 2020-10-25 DIAGNOSIS — N183 Chronic kidney disease, stage 3 unspecified: Secondary | ICD-10-CM | POA: Diagnosis not present

## 2020-10-25 DIAGNOSIS — I872 Venous insufficiency (chronic) (peripheral): Secondary | ICD-10-CM | POA: Diagnosis not present

## 2020-10-25 DIAGNOSIS — J9611 Chronic respiratory failure with hypoxia: Secondary | ICD-10-CM | POA: Diagnosis not present

## 2020-10-25 DIAGNOSIS — J9622 Acute and chronic respiratory failure with hypercapnia: Secondary | ICD-10-CM | POA: Diagnosis not present

## 2020-10-25 DIAGNOSIS — L97828 Non-pressure chronic ulcer of other part of left lower leg with other specified severity: Secondary | ICD-10-CM | POA: Diagnosis not present

## 2020-10-25 DIAGNOSIS — I5033 Acute on chronic diastolic (congestive) heart failure: Secondary | ICD-10-CM | POA: Diagnosis not present

## 2020-10-25 DIAGNOSIS — I130 Hypertensive heart and chronic kidney disease with heart failure and stage 1 through stage 4 chronic kidney disease, or unspecified chronic kidney disease: Secondary | ICD-10-CM | POA: Diagnosis not present

## 2020-10-29 DIAGNOSIS — N183 Chronic kidney disease, stage 3 unspecified: Secondary | ICD-10-CM | POA: Diagnosis not present

## 2020-10-29 DIAGNOSIS — L97828 Non-pressure chronic ulcer of other part of left lower leg with other specified severity: Secondary | ICD-10-CM | POA: Diagnosis not present

## 2020-10-29 DIAGNOSIS — J9622 Acute and chronic respiratory failure with hypercapnia: Secondary | ICD-10-CM | POA: Diagnosis not present

## 2020-10-29 DIAGNOSIS — A419 Sepsis, unspecified organism: Secondary | ICD-10-CM | POA: Diagnosis not present

## 2020-10-29 DIAGNOSIS — L97818 Non-pressure chronic ulcer of other part of right lower leg with other specified severity: Secondary | ICD-10-CM | POA: Diagnosis not present

## 2020-10-29 DIAGNOSIS — J9621 Acute and chronic respiratory failure with hypoxia: Secondary | ICD-10-CM | POA: Diagnosis not present

## 2020-10-29 DIAGNOSIS — I130 Hypertensive heart and chronic kidney disease with heart failure and stage 1 through stage 4 chronic kidney disease, or unspecified chronic kidney disease: Secondary | ICD-10-CM | POA: Diagnosis not present

## 2020-10-29 DIAGNOSIS — I872 Venous insufficiency (chronic) (peripheral): Secondary | ICD-10-CM | POA: Diagnosis not present

## 2020-10-29 DIAGNOSIS — I5033 Acute on chronic diastolic (congestive) heart failure: Secondary | ICD-10-CM | POA: Diagnosis not present

## 2020-10-31 DIAGNOSIS — L97828 Non-pressure chronic ulcer of other part of left lower leg with other specified severity: Secondary | ICD-10-CM | POA: Diagnosis not present

## 2020-10-31 DIAGNOSIS — I5033 Acute on chronic diastolic (congestive) heart failure: Secondary | ICD-10-CM | POA: Diagnosis not present

## 2020-10-31 DIAGNOSIS — A419 Sepsis, unspecified organism: Secondary | ICD-10-CM | POA: Diagnosis not present

## 2020-10-31 DIAGNOSIS — N183 Chronic kidney disease, stage 3 unspecified: Secondary | ICD-10-CM | POA: Diagnosis not present

## 2020-10-31 DIAGNOSIS — I130 Hypertensive heart and chronic kidney disease with heart failure and stage 1 through stage 4 chronic kidney disease, or unspecified chronic kidney disease: Secondary | ICD-10-CM | POA: Diagnosis not present

## 2020-10-31 DIAGNOSIS — L97818 Non-pressure chronic ulcer of other part of right lower leg with other specified severity: Secondary | ICD-10-CM | POA: Diagnosis not present

## 2020-10-31 DIAGNOSIS — J9621 Acute and chronic respiratory failure with hypoxia: Secondary | ICD-10-CM | POA: Diagnosis not present

## 2020-10-31 DIAGNOSIS — I872 Venous insufficiency (chronic) (peripheral): Secondary | ICD-10-CM | POA: Diagnosis not present

## 2020-10-31 DIAGNOSIS — J9622 Acute and chronic respiratory failure with hypercapnia: Secondary | ICD-10-CM | POA: Diagnosis not present

## 2020-11-01 DIAGNOSIS — I872 Venous insufficiency (chronic) (peripheral): Secondary | ICD-10-CM | POA: Diagnosis not present

## 2020-11-01 DIAGNOSIS — I5033 Acute on chronic diastolic (congestive) heart failure: Secondary | ICD-10-CM | POA: Diagnosis not present

## 2020-11-01 DIAGNOSIS — J9621 Acute and chronic respiratory failure with hypoxia: Secondary | ICD-10-CM | POA: Diagnosis not present

## 2020-11-01 DIAGNOSIS — J9622 Acute and chronic respiratory failure with hypercapnia: Secondary | ICD-10-CM | POA: Diagnosis not present

## 2020-11-01 DIAGNOSIS — L97828 Non-pressure chronic ulcer of other part of left lower leg with other specified severity: Secondary | ICD-10-CM | POA: Diagnosis not present

## 2020-11-01 DIAGNOSIS — I130 Hypertensive heart and chronic kidney disease with heart failure and stage 1 through stage 4 chronic kidney disease, or unspecified chronic kidney disease: Secondary | ICD-10-CM | POA: Diagnosis not present

## 2020-11-01 DIAGNOSIS — A419 Sepsis, unspecified organism: Secondary | ICD-10-CM | POA: Diagnosis not present

## 2020-11-01 DIAGNOSIS — N183 Chronic kidney disease, stage 3 unspecified: Secondary | ICD-10-CM | POA: Diagnosis not present

## 2020-11-01 DIAGNOSIS — L97818 Non-pressure chronic ulcer of other part of right lower leg with other specified severity: Secondary | ICD-10-CM | POA: Diagnosis not present

## 2020-11-02 DIAGNOSIS — I872 Venous insufficiency (chronic) (peripheral): Secondary | ICD-10-CM | POA: Diagnosis not present

## 2020-11-02 DIAGNOSIS — L97818 Non-pressure chronic ulcer of other part of right lower leg with other specified severity: Secondary | ICD-10-CM | POA: Diagnosis not present

## 2020-11-02 DIAGNOSIS — A419 Sepsis, unspecified organism: Secondary | ICD-10-CM | POA: Diagnosis not present

## 2020-11-02 DIAGNOSIS — L97828 Non-pressure chronic ulcer of other part of left lower leg with other specified severity: Secondary | ICD-10-CM | POA: Diagnosis not present

## 2020-11-02 DIAGNOSIS — I130 Hypertensive heart and chronic kidney disease with heart failure and stage 1 through stage 4 chronic kidney disease, or unspecified chronic kidney disease: Secondary | ICD-10-CM | POA: Diagnosis not present

## 2020-11-02 DIAGNOSIS — J9621 Acute and chronic respiratory failure with hypoxia: Secondary | ICD-10-CM | POA: Diagnosis not present

## 2020-11-02 DIAGNOSIS — I5033 Acute on chronic diastolic (congestive) heart failure: Secondary | ICD-10-CM | POA: Diagnosis not present

## 2020-11-02 DIAGNOSIS — J9622 Acute and chronic respiratory failure with hypercapnia: Secondary | ICD-10-CM | POA: Diagnosis not present

## 2020-11-02 DIAGNOSIS — N183 Chronic kidney disease, stage 3 unspecified: Secondary | ICD-10-CM | POA: Diagnosis not present

## 2020-11-05 DIAGNOSIS — I872 Venous insufficiency (chronic) (peripheral): Secondary | ICD-10-CM | POA: Diagnosis not present

## 2020-11-05 DIAGNOSIS — L97818 Non-pressure chronic ulcer of other part of right lower leg with other specified severity: Secondary | ICD-10-CM | POA: Diagnosis not present

## 2020-11-05 DIAGNOSIS — J9622 Acute and chronic respiratory failure with hypercapnia: Secondary | ICD-10-CM | POA: Diagnosis not present

## 2020-11-05 DIAGNOSIS — N183 Chronic kidney disease, stage 3 unspecified: Secondary | ICD-10-CM | POA: Diagnosis not present

## 2020-11-05 DIAGNOSIS — I130 Hypertensive heart and chronic kidney disease with heart failure and stage 1 through stage 4 chronic kidney disease, or unspecified chronic kidney disease: Secondary | ICD-10-CM | POA: Diagnosis not present

## 2020-11-05 DIAGNOSIS — A419 Sepsis, unspecified organism: Secondary | ICD-10-CM | POA: Diagnosis not present

## 2020-11-05 DIAGNOSIS — L97828 Non-pressure chronic ulcer of other part of left lower leg with other specified severity: Secondary | ICD-10-CM | POA: Diagnosis not present

## 2020-11-05 DIAGNOSIS — I5033 Acute on chronic diastolic (congestive) heart failure: Secondary | ICD-10-CM | POA: Diagnosis not present

## 2020-11-05 DIAGNOSIS — J9621 Acute and chronic respiratory failure with hypoxia: Secondary | ICD-10-CM | POA: Diagnosis not present

## 2020-11-06 DIAGNOSIS — I872 Venous insufficiency (chronic) (peripheral): Secondary | ICD-10-CM | POA: Diagnosis not present

## 2020-11-06 DIAGNOSIS — L97818 Non-pressure chronic ulcer of other part of right lower leg with other specified severity: Secondary | ICD-10-CM | POA: Diagnosis not present

## 2020-11-06 DIAGNOSIS — I5033 Acute on chronic diastolic (congestive) heart failure: Secondary | ICD-10-CM | POA: Diagnosis not present

## 2020-11-06 DIAGNOSIS — L97828 Non-pressure chronic ulcer of other part of left lower leg with other specified severity: Secondary | ICD-10-CM | POA: Diagnosis not present

## 2020-11-06 DIAGNOSIS — I130 Hypertensive heart and chronic kidney disease with heart failure and stage 1 through stage 4 chronic kidney disease, or unspecified chronic kidney disease: Secondary | ICD-10-CM | POA: Diagnosis not present

## 2020-11-06 DIAGNOSIS — A419 Sepsis, unspecified organism: Secondary | ICD-10-CM | POA: Diagnosis not present

## 2020-11-06 DIAGNOSIS — N183 Chronic kidney disease, stage 3 unspecified: Secondary | ICD-10-CM | POA: Diagnosis not present

## 2020-11-06 DIAGNOSIS — J9622 Acute and chronic respiratory failure with hypercapnia: Secondary | ICD-10-CM | POA: Diagnosis not present

## 2020-11-06 DIAGNOSIS — J9621 Acute and chronic respiratory failure with hypoxia: Secondary | ICD-10-CM | POA: Diagnosis not present

## 2020-11-08 DIAGNOSIS — J9621 Acute and chronic respiratory failure with hypoxia: Secondary | ICD-10-CM | POA: Diagnosis not present

## 2020-11-08 DIAGNOSIS — I5033 Acute on chronic diastolic (congestive) heart failure: Secondary | ICD-10-CM | POA: Diagnosis not present

## 2020-11-08 DIAGNOSIS — N183 Chronic kidney disease, stage 3 unspecified: Secondary | ICD-10-CM | POA: Diagnosis not present

## 2020-11-08 DIAGNOSIS — I130 Hypertensive heart and chronic kidney disease with heart failure and stage 1 through stage 4 chronic kidney disease, or unspecified chronic kidney disease: Secondary | ICD-10-CM | POA: Diagnosis not present

## 2020-11-08 DIAGNOSIS — A419 Sepsis, unspecified organism: Secondary | ICD-10-CM | POA: Diagnosis not present

## 2020-11-08 DIAGNOSIS — L97818 Non-pressure chronic ulcer of other part of right lower leg with other specified severity: Secondary | ICD-10-CM | POA: Diagnosis not present

## 2020-11-08 DIAGNOSIS — I872 Venous insufficiency (chronic) (peripheral): Secondary | ICD-10-CM | POA: Diagnosis not present

## 2020-11-08 DIAGNOSIS — J9622 Acute and chronic respiratory failure with hypercapnia: Secondary | ICD-10-CM | POA: Diagnosis not present

## 2020-11-08 DIAGNOSIS — L97828 Non-pressure chronic ulcer of other part of left lower leg with other specified severity: Secondary | ICD-10-CM | POA: Diagnosis not present

## 2020-11-12 DIAGNOSIS — J9621 Acute and chronic respiratory failure with hypoxia: Secondary | ICD-10-CM | POA: Diagnosis not present

## 2020-11-12 DIAGNOSIS — I130 Hypertensive heart and chronic kidney disease with heart failure and stage 1 through stage 4 chronic kidney disease, or unspecified chronic kidney disease: Secondary | ICD-10-CM | POA: Diagnosis not present

## 2020-11-12 DIAGNOSIS — I872 Venous insufficiency (chronic) (peripheral): Secondary | ICD-10-CM | POA: Diagnosis not present

## 2020-11-12 DIAGNOSIS — I5033 Acute on chronic diastolic (congestive) heart failure: Secondary | ICD-10-CM | POA: Diagnosis not present

## 2020-11-12 DIAGNOSIS — N183 Chronic kidney disease, stage 3 unspecified: Secondary | ICD-10-CM | POA: Diagnosis not present

## 2020-11-12 DIAGNOSIS — J9622 Acute and chronic respiratory failure with hypercapnia: Secondary | ICD-10-CM | POA: Diagnosis not present

## 2020-11-12 DIAGNOSIS — A419 Sepsis, unspecified organism: Secondary | ICD-10-CM | POA: Diagnosis not present

## 2020-11-12 DIAGNOSIS — L97818 Non-pressure chronic ulcer of other part of right lower leg with other specified severity: Secondary | ICD-10-CM | POA: Diagnosis not present

## 2020-11-12 DIAGNOSIS — L97828 Non-pressure chronic ulcer of other part of left lower leg with other specified severity: Secondary | ICD-10-CM | POA: Diagnosis not present

## 2020-11-14 ENCOUNTER — Telehealth: Payer: Self-pay

## 2020-11-14 DIAGNOSIS — L97818 Non-pressure chronic ulcer of other part of right lower leg with other specified severity: Secondary | ICD-10-CM | POA: Diagnosis not present

## 2020-11-14 DIAGNOSIS — I130 Hypertensive heart and chronic kidney disease with heart failure and stage 1 through stage 4 chronic kidney disease, or unspecified chronic kidney disease: Secondary | ICD-10-CM | POA: Diagnosis not present

## 2020-11-14 DIAGNOSIS — I872 Venous insufficiency (chronic) (peripheral): Secondary | ICD-10-CM | POA: Diagnosis not present

## 2020-11-14 DIAGNOSIS — N183 Chronic kidney disease, stage 3 unspecified: Secondary | ICD-10-CM | POA: Diagnosis not present

## 2020-11-14 DIAGNOSIS — J9622 Acute and chronic respiratory failure with hypercapnia: Secondary | ICD-10-CM | POA: Diagnosis not present

## 2020-11-14 DIAGNOSIS — L97828 Non-pressure chronic ulcer of other part of left lower leg with other specified severity: Secondary | ICD-10-CM | POA: Diagnosis not present

## 2020-11-14 DIAGNOSIS — J9621 Acute and chronic respiratory failure with hypoxia: Secondary | ICD-10-CM | POA: Diagnosis not present

## 2020-11-14 DIAGNOSIS — A419 Sepsis, unspecified organism: Secondary | ICD-10-CM | POA: Diagnosis not present

## 2020-11-14 DIAGNOSIS — I5033 Acute on chronic diastolic (congestive) heart failure: Secondary | ICD-10-CM | POA: Diagnosis not present

## 2020-11-14 NOTE — Telephone Encounter (Signed)
Pls contact [facility] [place] for outstanding orders [phone]

## 2020-11-14 NOTE — Telephone Encounter (Signed)
Returned call to [place] with [facility] She states she is in need of the following signed [facility] orders:  [id number]  These need to be faxed to her at [phone]  Will forward to [place] Staff for assistance in finding these orders and faxing to [facility].

## 2020-11-15 DIAGNOSIS — A419 Sepsis, unspecified organism: Secondary | ICD-10-CM | POA: Diagnosis not present

## 2020-11-15 DIAGNOSIS — N183 Chronic kidney disease, stage 3 unspecified: Secondary | ICD-10-CM | POA: Diagnosis not present

## 2020-11-15 DIAGNOSIS — I130 Hypertensive heart and chronic kidney disease with heart failure and stage 1 through stage 4 chronic kidney disease, or unspecified chronic kidney disease: Secondary | ICD-10-CM | POA: Diagnosis not present

## 2020-11-15 DIAGNOSIS — J9622 Acute and chronic respiratory failure with hypercapnia: Secondary | ICD-10-CM | POA: Diagnosis not present

## 2020-11-15 DIAGNOSIS — I5033 Acute on chronic diastolic (congestive) heart failure: Secondary | ICD-10-CM | POA: Diagnosis not present

## 2020-11-15 DIAGNOSIS — J9621 Acute and chronic respiratory failure with hypoxia: Secondary | ICD-10-CM | POA: Diagnosis not present

## 2020-11-15 DIAGNOSIS — I872 Venous insufficiency (chronic) (peripheral): Secondary | ICD-10-CM | POA: Diagnosis not present

## 2020-11-15 DIAGNOSIS — L97818 Non-pressure chronic ulcer of other part of right lower leg with other specified severity: Secondary | ICD-10-CM | POA: Diagnosis not present

## 2020-11-15 DIAGNOSIS — L97828 Non-pressure chronic ulcer of other part of left lower leg with other specified severity: Secondary | ICD-10-CM | POA: Diagnosis not present

## 2020-11-15 NOTE — Telephone Encounter (Signed)
I faxed all the orders to [facility] @ [phone].

## 2020-11-16 DIAGNOSIS — J9611 Chronic respiratory failure with hypoxia: Secondary | ICD-10-CM | POA: Diagnosis not present

## 2020-11-16 DIAGNOSIS — R269 Unspecified abnormalities of gait and mobility: Secondary | ICD-10-CM | POA: Diagnosis not present

## 2020-11-16 DIAGNOSIS — I5033 Acute on chronic diastolic (congestive) heart failure: Secondary | ICD-10-CM | POA: Diagnosis not present

## 2020-11-16 DIAGNOSIS — E662 Morbid (severe) obesity with alveolar hypoventilation: Secondary | ICD-10-CM | POA: Diagnosis not present

## 2020-11-19 DIAGNOSIS — N183 Chronic kidney disease, stage 3 unspecified: Secondary | ICD-10-CM | POA: Diagnosis not present

## 2020-11-19 DIAGNOSIS — L97828 Non-pressure chronic ulcer of other part of left lower leg with other specified severity: Secondary | ICD-10-CM | POA: Diagnosis not present

## 2020-11-19 DIAGNOSIS — I130 Hypertensive heart and chronic kidney disease with heart failure and stage 1 through stage 4 chronic kidney disease, or unspecified chronic kidney disease: Secondary | ICD-10-CM | POA: Diagnosis not present

## 2020-11-19 DIAGNOSIS — L97818 Non-pressure chronic ulcer of other part of right lower leg with other specified severity: Secondary | ICD-10-CM | POA: Diagnosis not present

## 2020-11-19 DIAGNOSIS — J9622 Acute and chronic respiratory failure with hypercapnia: Secondary | ICD-10-CM | POA: Diagnosis not present

## 2020-11-19 DIAGNOSIS — A419 Sepsis, unspecified organism: Secondary | ICD-10-CM | POA: Diagnosis not present

## 2020-11-19 DIAGNOSIS — I5033 Acute on chronic diastolic (congestive) heart failure: Secondary | ICD-10-CM | POA: Diagnosis not present

## 2020-11-19 DIAGNOSIS — I872 Venous insufficiency (chronic) (peripheral): Secondary | ICD-10-CM | POA: Diagnosis not present

## 2020-11-19 DIAGNOSIS — J9621 Acute and chronic respiratory failure with hypoxia: Secondary | ICD-10-CM | POA: Diagnosis not present

## 2020-11-20 DIAGNOSIS — A419 Sepsis, unspecified organism: Secondary | ICD-10-CM | POA: Diagnosis not present

## 2020-11-20 DIAGNOSIS — I872 Venous insufficiency (chronic) (peripheral): Secondary | ICD-10-CM | POA: Diagnosis not present

## 2020-11-20 DIAGNOSIS — N183 Chronic kidney disease, stage 3 unspecified: Secondary | ICD-10-CM | POA: Diagnosis not present

## 2020-11-20 DIAGNOSIS — I130 Hypertensive heart and chronic kidney disease with heart failure and stage 1 through stage 4 chronic kidney disease, or unspecified chronic kidney disease: Secondary | ICD-10-CM | POA: Diagnosis not present

## 2020-11-20 DIAGNOSIS — I5033 Acute on chronic diastolic (congestive) heart failure: Secondary | ICD-10-CM | POA: Diagnosis not present

## 2020-11-20 DIAGNOSIS — L97828 Non-pressure chronic ulcer of other part of left lower leg with other specified severity: Secondary | ICD-10-CM | POA: Diagnosis not present

## 2020-11-20 DIAGNOSIS — L97818 Non-pressure chronic ulcer of other part of right lower leg with other specified severity: Secondary | ICD-10-CM | POA: Diagnosis not present

## 2020-11-20 DIAGNOSIS — J9621 Acute and chronic respiratory failure with hypoxia: Secondary | ICD-10-CM | POA: Diagnosis not present

## 2020-11-20 DIAGNOSIS — J9622 Acute and chronic respiratory failure with hypercapnia: Secondary | ICD-10-CM | POA: Diagnosis not present

## 2020-11-22 DIAGNOSIS — I130 Hypertensive heart and chronic kidney disease with heart failure and stage 1 through stage 4 chronic kidney disease, or unspecified chronic kidney disease: Secondary | ICD-10-CM | POA: Diagnosis not present

## 2020-11-22 DIAGNOSIS — J9622 Acute and chronic respiratory failure with hypercapnia: Secondary | ICD-10-CM | POA: Diagnosis not present

## 2020-11-22 DIAGNOSIS — A419 Sepsis, unspecified organism: Secondary | ICD-10-CM | POA: Diagnosis not present

## 2020-11-22 DIAGNOSIS — L97828 Non-pressure chronic ulcer of other part of left lower leg with other specified severity: Secondary | ICD-10-CM | POA: Diagnosis not present

## 2020-11-22 DIAGNOSIS — N183 Chronic kidney disease, stage 3 unspecified: Secondary | ICD-10-CM | POA: Diagnosis not present

## 2020-11-22 DIAGNOSIS — L97818 Non-pressure chronic ulcer of other part of right lower leg with other specified severity: Secondary | ICD-10-CM | POA: Diagnosis not present

## 2020-11-22 DIAGNOSIS — I872 Venous insufficiency (chronic) (peripheral): Secondary | ICD-10-CM | POA: Diagnosis not present

## 2020-11-22 DIAGNOSIS — J9621 Acute and chronic respiratory failure with hypoxia: Secondary | ICD-10-CM | POA: Diagnosis not present

## 2020-11-22 DIAGNOSIS — I5033 Acute on chronic diastolic (congestive) heart failure: Secondary | ICD-10-CM | POA: Diagnosis not present

## 2020-11-26 DIAGNOSIS — I872 Venous insufficiency (chronic) (peripheral): Secondary | ICD-10-CM | POA: Diagnosis not present

## 2020-11-26 DIAGNOSIS — A419 Sepsis, unspecified organism: Secondary | ICD-10-CM | POA: Diagnosis not present

## 2020-11-26 DIAGNOSIS — L97828 Non-pressure chronic ulcer of other part of left lower leg with other specified severity: Secondary | ICD-10-CM | POA: Diagnosis not present

## 2020-11-26 DIAGNOSIS — J9622 Acute and chronic respiratory failure with hypercapnia: Secondary | ICD-10-CM | POA: Diagnosis not present

## 2020-11-26 DIAGNOSIS — I130 Hypertensive heart and chronic kidney disease with heart failure and stage 1 through stage 4 chronic kidney disease, or unspecified chronic kidney disease: Secondary | ICD-10-CM | POA: Diagnosis not present

## 2020-11-26 DIAGNOSIS — L97818 Non-pressure chronic ulcer of other part of right lower leg with other specified severity: Secondary | ICD-10-CM | POA: Diagnosis not present

## 2020-11-26 DIAGNOSIS — I5033 Acute on chronic diastolic (congestive) heart failure: Secondary | ICD-10-CM | POA: Diagnosis not present

## 2020-11-26 DIAGNOSIS — N183 Chronic kidney disease, stage 3 unspecified: Secondary | ICD-10-CM | POA: Diagnosis not present

## 2020-11-26 DIAGNOSIS — J9621 Acute and chronic respiratory failure with hypoxia: Secondary | ICD-10-CM | POA: Diagnosis not present

## 2020-11-27 DIAGNOSIS — J9621 Acute and chronic respiratory failure with hypoxia: Secondary | ICD-10-CM | POA: Diagnosis not present

## 2020-11-27 DIAGNOSIS — J9622 Acute and chronic respiratory failure with hypercapnia: Secondary | ICD-10-CM | POA: Diagnosis not present

## 2020-11-27 DIAGNOSIS — L97818 Non-pressure chronic ulcer of other part of right lower leg with other specified severity: Secondary | ICD-10-CM | POA: Diagnosis not present

## 2020-11-27 DIAGNOSIS — I130 Hypertensive heart and chronic kidney disease with heart failure and stage 1 through stage 4 chronic kidney disease, or unspecified chronic kidney disease: Secondary | ICD-10-CM | POA: Diagnosis not present

## 2020-11-27 DIAGNOSIS — I5033 Acute on chronic diastolic (congestive) heart failure: Secondary | ICD-10-CM | POA: Diagnosis not present

## 2020-11-27 DIAGNOSIS — A419 Sepsis, unspecified organism: Secondary | ICD-10-CM | POA: Diagnosis not present

## 2020-11-27 DIAGNOSIS — L97828 Non-pressure chronic ulcer of other part of left lower leg with other specified severity: Secondary | ICD-10-CM | POA: Diagnosis not present

## 2020-11-27 DIAGNOSIS — N183 Chronic kidney disease, stage 3 unspecified: Secondary | ICD-10-CM | POA: Diagnosis not present

## 2020-11-27 DIAGNOSIS — I872 Venous insufficiency (chronic) (peripheral): Secondary | ICD-10-CM | POA: Diagnosis not present

## 2020-11-28 DIAGNOSIS — A419 Sepsis, unspecified organism: Secondary | ICD-10-CM | POA: Diagnosis not present

## 2020-11-28 DIAGNOSIS — I130 Hypertensive heart and chronic kidney disease with heart failure and stage 1 through stage 4 chronic kidney disease, or unspecified chronic kidney disease: Secondary | ICD-10-CM | POA: Diagnosis not present

## 2020-11-28 DIAGNOSIS — J9621 Acute and chronic respiratory failure with hypoxia: Secondary | ICD-10-CM | POA: Diagnosis not present

## 2020-11-28 DIAGNOSIS — I872 Venous insufficiency (chronic) (peripheral): Secondary | ICD-10-CM | POA: Diagnosis not present

## 2020-11-28 DIAGNOSIS — J9622 Acute and chronic respiratory failure with hypercapnia: Secondary | ICD-10-CM | POA: Diagnosis not present

## 2020-11-28 DIAGNOSIS — I5033 Acute on chronic diastolic (congestive) heart failure: Secondary | ICD-10-CM | POA: Diagnosis not present

## 2020-11-28 DIAGNOSIS — L97818 Non-pressure chronic ulcer of other part of right lower leg with other specified severity: Secondary | ICD-10-CM | POA: Diagnosis not present

## 2020-11-28 DIAGNOSIS — N183 Chronic kidney disease, stage 3 unspecified: Secondary | ICD-10-CM | POA: Diagnosis not present

## 2020-11-28 DIAGNOSIS — L97828 Non-pressure chronic ulcer of other part of left lower leg with other specified severity: Secondary | ICD-10-CM | POA: Diagnosis not present

## 2020-11-29 DIAGNOSIS — I5033 Acute on chronic diastolic (congestive) heart failure: Secondary | ICD-10-CM | POA: Diagnosis not present

## 2020-11-29 DIAGNOSIS — A419 Sepsis, unspecified organism: Secondary | ICD-10-CM | POA: Diagnosis not present

## 2020-11-29 DIAGNOSIS — I130 Hypertensive heart and chronic kidney disease with heart failure and stage 1 through stage 4 chronic kidney disease, or unspecified chronic kidney disease: Secondary | ICD-10-CM | POA: Diagnosis not present

## 2020-11-29 DIAGNOSIS — L97828 Non-pressure chronic ulcer of other part of left lower leg with other specified severity: Secondary | ICD-10-CM | POA: Diagnosis not present

## 2020-11-29 DIAGNOSIS — I872 Venous insufficiency (chronic) (peripheral): Secondary | ICD-10-CM | POA: Diagnosis not present

## 2020-11-29 DIAGNOSIS — J9621 Acute and chronic respiratory failure with hypoxia: Secondary | ICD-10-CM | POA: Diagnosis not present

## 2020-11-29 DIAGNOSIS — L97818 Non-pressure chronic ulcer of other part of right lower leg with other specified severity: Secondary | ICD-10-CM | POA: Diagnosis not present

## 2020-11-29 DIAGNOSIS — N183 Chronic kidney disease, stage 3 unspecified: Secondary | ICD-10-CM | POA: Diagnosis not present

## 2020-11-29 DIAGNOSIS — J9622 Acute and chronic respiratory failure with hypercapnia: Secondary | ICD-10-CM | POA: Diagnosis not present

## 2020-12-02 DIAGNOSIS — N183 Chronic kidney disease, stage 3 unspecified: Secondary | ICD-10-CM | POA: Diagnosis not present

## 2020-12-02 DIAGNOSIS — I130 Hypertensive heart and chronic kidney disease with heart failure and stage 1 through stage 4 chronic kidney disease, or unspecified chronic kidney disease: Secondary | ICD-10-CM | POA: Diagnosis not present

## 2020-12-02 DIAGNOSIS — I872 Venous insufficiency (chronic) (peripheral): Secondary | ICD-10-CM | POA: Diagnosis not present

## 2020-12-02 DIAGNOSIS — I5033 Acute on chronic diastolic (congestive) heart failure: Secondary | ICD-10-CM | POA: Diagnosis not present

## 2020-12-02 DIAGNOSIS — M1990 Unspecified osteoarthritis, unspecified site: Secondary | ICD-10-CM | POA: Diagnosis not present

## 2020-12-02 DIAGNOSIS — M810 Age-related osteoporosis without current pathological fracture: Secondary | ICD-10-CM | POA: Diagnosis not present

## 2020-12-02 DIAGNOSIS — I890 Lymphedema, not elsewhere classified: Secondary | ICD-10-CM | POA: Diagnosis not present

## 2020-12-02 DIAGNOSIS — J9621 Acute and chronic respiratory failure with hypoxia: Secondary | ICD-10-CM | POA: Diagnosis not present

## 2020-12-02 DIAGNOSIS — J9622 Acute and chronic respiratory failure with hypercapnia: Secondary | ICD-10-CM | POA: Diagnosis not present

## 2020-12-03 DIAGNOSIS — I130 Hypertensive heart and chronic kidney disease with heart failure and stage 1 through stage 4 chronic kidney disease, or unspecified chronic kidney disease: Secondary | ICD-10-CM | POA: Diagnosis not present

## 2020-12-03 DIAGNOSIS — I5033 Acute on chronic diastolic (congestive) heart failure: Secondary | ICD-10-CM | POA: Diagnosis not present

## 2020-12-03 DIAGNOSIS — I872 Venous insufficiency (chronic) (peripheral): Secondary | ICD-10-CM | POA: Diagnosis not present

## 2020-12-03 DIAGNOSIS — M1990 Unspecified osteoarthritis, unspecified site: Secondary | ICD-10-CM | POA: Diagnosis not present

## 2020-12-03 DIAGNOSIS — I890 Lymphedema, not elsewhere classified: Secondary | ICD-10-CM | POA: Diagnosis not present

## 2020-12-03 DIAGNOSIS — J9621 Acute and chronic respiratory failure with hypoxia: Secondary | ICD-10-CM | POA: Diagnosis not present

## 2020-12-03 DIAGNOSIS — J9622 Acute and chronic respiratory failure with hypercapnia: Secondary | ICD-10-CM | POA: Diagnosis not present

## 2020-12-03 DIAGNOSIS — M810 Age-related osteoporosis without current pathological fracture: Secondary | ICD-10-CM | POA: Diagnosis not present

## 2020-12-03 DIAGNOSIS — N183 Chronic kidney disease, stage 3 unspecified: Secondary | ICD-10-CM | POA: Diagnosis not present

## 2020-12-05 DIAGNOSIS — M810 Age-related osteoporosis without current pathological fracture: Secondary | ICD-10-CM | POA: Diagnosis not present

## 2020-12-05 DIAGNOSIS — N183 Chronic kidney disease, stage 3 unspecified: Secondary | ICD-10-CM | POA: Diagnosis not present

## 2020-12-05 DIAGNOSIS — I872 Venous insufficiency (chronic) (peripheral): Secondary | ICD-10-CM | POA: Diagnosis not present

## 2020-12-05 DIAGNOSIS — M1990 Unspecified osteoarthritis, unspecified site: Secondary | ICD-10-CM | POA: Diagnosis not present

## 2020-12-05 DIAGNOSIS — J9622 Acute and chronic respiratory failure with hypercapnia: Secondary | ICD-10-CM | POA: Diagnosis not present

## 2020-12-05 DIAGNOSIS — J9621 Acute and chronic respiratory failure with hypoxia: Secondary | ICD-10-CM | POA: Diagnosis not present

## 2020-12-05 DIAGNOSIS — I130 Hypertensive heart and chronic kidney disease with heart failure and stage 1 through stage 4 chronic kidney disease, or unspecified chronic kidney disease: Secondary | ICD-10-CM | POA: Diagnosis not present

## 2020-12-05 DIAGNOSIS — I5033 Acute on chronic diastolic (congestive) heart failure: Secondary | ICD-10-CM | POA: Diagnosis not present

## 2020-12-05 DIAGNOSIS — I890 Lymphedema, not elsewhere classified: Secondary | ICD-10-CM | POA: Diagnosis not present

## 2020-12-06 DIAGNOSIS — N183 Chronic kidney disease, stage 3 unspecified: Secondary | ICD-10-CM | POA: Diagnosis not present

## 2020-12-06 DIAGNOSIS — J9622 Acute and chronic respiratory failure with hypercapnia: Secondary | ICD-10-CM | POA: Diagnosis not present

## 2020-12-06 DIAGNOSIS — I872 Venous insufficiency (chronic) (peripheral): Secondary | ICD-10-CM | POA: Diagnosis not present

## 2020-12-06 DIAGNOSIS — I5033 Acute on chronic diastolic (congestive) heart failure: Secondary | ICD-10-CM | POA: Diagnosis not present

## 2020-12-06 DIAGNOSIS — J9621 Acute and chronic respiratory failure with hypoxia: Secondary | ICD-10-CM | POA: Diagnosis not present

## 2020-12-06 DIAGNOSIS — M1990 Unspecified osteoarthritis, unspecified site: Secondary | ICD-10-CM | POA: Diagnosis not present

## 2020-12-06 DIAGNOSIS — I890 Lymphedema, not elsewhere classified: Secondary | ICD-10-CM | POA: Diagnosis not present

## 2020-12-06 DIAGNOSIS — M810 Age-related osteoporosis without current pathological fracture: Secondary | ICD-10-CM | POA: Diagnosis not present

## 2020-12-06 DIAGNOSIS — I130 Hypertensive heart and chronic kidney disease with heart failure and stage 1 through stage 4 chronic kidney disease, or unspecified chronic kidney disease: Secondary | ICD-10-CM | POA: Diagnosis not present

## 2020-12-07 ENCOUNTER — Telehealth: Payer: Self-pay | Admitting: *Deleted

## 2020-12-07 NOTE — Telephone Encounter (Signed)
She has not had an official letter of warning due to Dr. Konrad Penta and Dr. Philipp Ovens feeling like there was good reasoning for her missing and not being able to get in.  I am attaching Dr. Philipp Ovens and Yvonna Alanis to see if we need to send her out a letter in anticipation of dismissal.  Would continue services for now.

## 2020-12-07 NOTE — Telephone Encounter (Signed)
I will defer to the PCP and faculty involved - pt would need a warning letter before dismissal per policy - also wonder if this pt is a potential CCM candidate for f/u on a more regular basis by nursing and perhaps SW? Thanks, [name]

## 2020-12-07 NOTE — Telephone Encounter (Signed)
Terance Hart, RN with [facility] called to report that patient is having a lot of fluid build up especially in LEs and bottom. States she does not believe patient is adherent with lasix 80 mg. Also, patient is not attempting low sodium diet. She will tell Magda Paganini she had a [place] sandwich and then daughter tells her, no she had [place]. Per PCP's OV note on 10/04/2020, patient was to schedule an in person appt at [facility] by 11/13/2020 or be dismissed from clinic. If patient is dismissed HH will no longer be able to go out. Please advise.

## 2020-12-10 NOTE — Telephone Encounter (Signed)
I think a dismissal letter would be appropriate but will wait to hear from Dr. Konrad Penta to follow up any CCM recommendations. I think a referral was placed.

## 2020-12-11 DIAGNOSIS — I872 Venous insufficiency (chronic) (peripheral): Secondary | ICD-10-CM | POA: Diagnosis not present

## 2020-12-11 DIAGNOSIS — I5033 Acute on chronic diastolic (congestive) heart failure: Secondary | ICD-10-CM | POA: Diagnosis not present

## 2020-12-11 DIAGNOSIS — M1990 Unspecified osteoarthritis, unspecified site: Secondary | ICD-10-CM | POA: Diagnosis not present

## 2020-12-11 DIAGNOSIS — J9622 Acute and chronic respiratory failure with hypercapnia: Secondary | ICD-10-CM | POA: Diagnosis not present

## 2020-12-11 DIAGNOSIS — I130 Hypertensive heart and chronic kidney disease with heart failure and stage 1 through stage 4 chronic kidney disease, or unspecified chronic kidney disease: Secondary | ICD-10-CM | POA: Diagnosis not present

## 2020-12-11 DIAGNOSIS — M810 Age-related osteoporosis without current pathological fracture: Secondary | ICD-10-CM | POA: Diagnosis not present

## 2020-12-11 DIAGNOSIS — J9621 Acute and chronic respiratory failure with hypoxia: Secondary | ICD-10-CM | POA: Diagnosis not present

## 2020-12-11 DIAGNOSIS — N183 Chronic kidney disease, stage 3 unspecified: Secondary | ICD-10-CM | POA: Diagnosis not present

## 2020-12-11 DIAGNOSIS — I890 Lymphedema, not elsewhere classified: Secondary | ICD-10-CM | POA: Diagnosis not present

## 2020-12-12 DIAGNOSIS — N183 Chronic kidney disease, stage 3 unspecified: Secondary | ICD-10-CM | POA: Diagnosis not present

## 2020-12-12 DIAGNOSIS — I130 Hypertensive heart and chronic kidney disease with heart failure and stage 1 through stage 4 chronic kidney disease, or unspecified chronic kidney disease: Secondary | ICD-10-CM | POA: Diagnosis not present

## 2020-12-12 DIAGNOSIS — M1990 Unspecified osteoarthritis, unspecified site: Secondary | ICD-10-CM | POA: Diagnosis not present

## 2020-12-12 DIAGNOSIS — I872 Venous insufficiency (chronic) (peripheral): Secondary | ICD-10-CM | POA: Diagnosis not present

## 2020-12-12 DIAGNOSIS — I5033 Acute on chronic diastolic (congestive) heart failure: Secondary | ICD-10-CM | POA: Diagnosis not present

## 2020-12-12 DIAGNOSIS — J9621 Acute and chronic respiratory failure with hypoxia: Secondary | ICD-10-CM | POA: Diagnosis not present

## 2020-12-12 DIAGNOSIS — I890 Lymphedema, not elsewhere classified: Secondary | ICD-10-CM | POA: Diagnosis not present

## 2020-12-12 DIAGNOSIS — J9622 Acute and chronic respiratory failure with hypercapnia: Secondary | ICD-10-CM | POA: Diagnosis not present

## 2020-12-12 DIAGNOSIS — M810 Age-related osteoporosis without current pathological fracture: Secondary | ICD-10-CM | POA: Diagnosis not present

## 2020-12-12 NOTE — Telephone Encounter (Signed)
Since Dr. Konrad Penta notes multiple attempts by CCM made to contact pt without response, and recommends proceding with initial warning letter about conditions that must be met for pt to continue being followed by an [facility] physician, please notify Dr. Daryll Drown of the need for the Medical director to please send initial warning letter to pt - you can give Dr. Daryll Drown the conditions you want pt to meet to continue to be a pt at [facility]. Glad to review after letter constructed per your specifications. Thanks, Yvonna Alanis, RN, 12/12/20, 10:53AM

## 2020-12-13 NOTE — Telephone Encounter (Signed)
Friends - -  Send me qualifications for a Biochemist, clinical (must come to appointments, must answer phone calls from CCM, etc) with dates.  If she then is not able to meet these expectations we can dismiss her.  Would recommend a relatively short time frame based on your notes.   Thanks all!  Gilles Chiquito

## 2020-12-13 NOTE — Telephone Encounter (Signed)
Just sent in a staff message.

## 2020-12-17 DIAGNOSIS — R269 Unspecified abnormalities of gait and mobility: Secondary | ICD-10-CM | POA: Diagnosis not present

## 2020-12-17 DIAGNOSIS — E662 Morbid (severe) obesity with alveolar hypoventilation: Secondary | ICD-10-CM | POA: Diagnosis not present

## 2020-12-17 DIAGNOSIS — I5033 Acute on chronic diastolic (congestive) heart failure: Secondary | ICD-10-CM | POA: Diagnosis not present

## 2020-12-17 DIAGNOSIS — J9611 Chronic respiratory failure with hypoxia: Secondary | ICD-10-CM | POA: Diagnosis not present

## 2020-12-17 NOTE — Telephone Encounter (Signed)
Thank you.  Will take care of this asap.

## 2020-12-18 DIAGNOSIS — N183 Chronic kidney disease, stage 3 unspecified: Secondary | ICD-10-CM | POA: Diagnosis not present

## 2020-12-18 DIAGNOSIS — M810 Age-related osteoporosis without current pathological fracture: Secondary | ICD-10-CM | POA: Diagnosis not present

## 2020-12-18 DIAGNOSIS — I5033 Acute on chronic diastolic (congestive) heart failure: Secondary | ICD-10-CM | POA: Diagnosis not present

## 2020-12-18 DIAGNOSIS — J9622 Acute and chronic respiratory failure with hypercapnia: Secondary | ICD-10-CM | POA: Diagnosis not present

## 2020-12-18 DIAGNOSIS — M1990 Unspecified osteoarthritis, unspecified site: Secondary | ICD-10-CM | POA: Diagnosis not present

## 2020-12-18 DIAGNOSIS — J9621 Acute and chronic respiratory failure with hypoxia: Secondary | ICD-10-CM | POA: Diagnosis not present

## 2020-12-18 DIAGNOSIS — I890 Lymphedema, not elsewhere classified: Secondary | ICD-10-CM | POA: Diagnosis not present

## 2020-12-18 DIAGNOSIS — I130 Hypertensive heart and chronic kidney disease with heart failure and stage 1 through stage 4 chronic kidney disease, or unspecified chronic kidney disease: Secondary | ICD-10-CM | POA: Diagnosis not present

## 2020-12-18 DIAGNOSIS — I872 Venous insufficiency (chronic) (peripheral): Secondary | ICD-10-CM | POA: Diagnosis not present

## 2020-12-20 ENCOUNTER — Encounter: Payer: Self-pay | Admitting: Internal Medicine

## 2020-12-20 NOTE — Progress Notes (Signed)
December 26, 2020   Dear [name],  We've missed you!  The [facility] strives to provide the best medical care possible.  In order to do this, our physicians must be able to visit with and examine you personally when medically necessary - and to have lab and other tests and screening procedures completed - at specific intervals in order to monitor your conditions.  We want to continue to be your health care provider, but in order to do so we request that you meet the following expectations -    1. You must come to clinic appointments in person at least every 3-6  months to ensure safe medical management of your chronic medical conditions. Your doctor will determine the appropriate interval.  2. You must answer or return all calls from the clinic when we attempt to contact you for appointments, or to connect you with chronic care management, or to connect you with any other services you're referred to for care.   3. You must have blood work done as determined by your physician.  This is important so that we can safely manage your medical conditions and your treatment regimen.   Meeting each of these expectations is necessary in order for [place] to partner with you as your health care provider.   Please note it is also not safe medical practice to refill medications without being seen by a physician regularly with appropriate monitoring.  As always, it is clearly your choice whether to honor our clinic policy or transfer your care to another provider with whom you feel you can have a more personally satisfying and productive health care experience.  This letter will serve as a notification that not meeting these expectations, including coming in person to scheduled clinic appointments, will result in dismissal from the [facility].  In the event of dismissal, the [facility] will continue to provide care and prescriptions for you for 30 days in order for you to have time to find a  new primary care provider.    We understand that there may be circumstances which make it difficult to come to appointments.  If so, please come in so that we can discuss your situation and work toward solutions.  We can arrange for non-emergent EMS transportation to pick you up and take you home!    [name], we want you to stay safe and to be as healthy as possible.  Please call our clinic to schedule an in-person appointment within the next 30 days. If you are not seen by Jan 27, 2021, you will unfortunately be dismissed from our clinic because we will be unable to meet our responsibilities to you as your health care provider.    Sincerely,  Dorian Pod, MD   [place], [place]

## 2020-12-26 DIAGNOSIS — I890 Lymphedema, not elsewhere classified: Secondary | ICD-10-CM | POA: Diagnosis not present

## 2020-12-26 DIAGNOSIS — I5033 Acute on chronic diastolic (congestive) heart failure: Secondary | ICD-10-CM | POA: Diagnosis not present

## 2020-12-26 DIAGNOSIS — I130 Hypertensive heart and chronic kidney disease with heart failure and stage 1 through stage 4 chronic kidney disease, or unspecified chronic kidney disease: Secondary | ICD-10-CM | POA: Diagnosis not present

## 2020-12-26 DIAGNOSIS — M810 Age-related osteoporosis without current pathological fracture: Secondary | ICD-10-CM | POA: Diagnosis not present

## 2020-12-26 DIAGNOSIS — N183 Chronic kidney disease, stage 3 unspecified: Secondary | ICD-10-CM | POA: Diagnosis not present

## 2020-12-26 DIAGNOSIS — M1990 Unspecified osteoarthritis, unspecified site: Secondary | ICD-10-CM | POA: Diagnosis not present

## 2020-12-26 DIAGNOSIS — I872 Venous insufficiency (chronic) (peripheral): Secondary | ICD-10-CM | POA: Diagnosis not present

## 2020-12-26 DIAGNOSIS — J9621 Acute and chronic respiratory failure with hypoxia: Secondary | ICD-10-CM | POA: Diagnosis not present

## 2020-12-26 DIAGNOSIS — J9622 Acute and chronic respiratory failure with hypercapnia: Secondary | ICD-10-CM | POA: Diagnosis not present

## 2020-12-30 ENCOUNTER — Other Ambulatory Visit: Payer: Self-pay | Admitting: Student

## 2021-01-02 ENCOUNTER — Other Ambulatory Visit: Payer: Self-pay

## 2021-01-02 ENCOUNTER — Emergency Department (HOSPITAL_COMMUNITY): Payer: Medicare PPO

## 2021-01-02 ENCOUNTER — Encounter (HOSPITAL_COMMUNITY): Payer: Self-pay | Admitting: *Deleted

## 2021-01-02 ENCOUNTER — Inpatient Hospital Stay (HOSPITAL_COMMUNITY)
Admission: EM | Admit: 2021-01-02 | Discharge: 2021-01-21 | DRG: 199 | Disposition: A | Discharge status: Skilled Nursing Facility | Payer: Medicare PPO | Attending: Internal Medicine | Admitting: Internal Medicine

## 2021-01-02 DIAGNOSIS — J9622 Acute and chronic respiratory failure with hypercapnia: Secondary | ICD-10-CM | POA: Diagnosis present

## 2021-01-02 DIAGNOSIS — M1990 Unspecified osteoarthritis, unspecified site: Secondary | ICD-10-CM | POA: Diagnosis present

## 2021-01-02 DIAGNOSIS — D693 Immune thrombocytopenic purpura: Secondary | ICD-10-CM | POA: Diagnosis present

## 2021-01-02 DIAGNOSIS — I10 Essential (primary) hypertension: Secondary | ICD-10-CM | POA: Diagnosis not present

## 2021-01-02 DIAGNOSIS — Z90722 Acquired absence of ovaries, bilateral: Secondary | ICD-10-CM

## 2021-01-02 DIAGNOSIS — S3993XA Unspecified injury of pelvis, initial encounter: Secondary | ICD-10-CM | POA: Diagnosis not present

## 2021-01-02 DIAGNOSIS — Z978 Presence of other specified devices: Secondary | ICD-10-CM | POA: Diagnosis not present

## 2021-01-02 DIAGNOSIS — Z8249 Family history of ischemic heart disease and other diseases of the circulatory system: Secondary | ICD-10-CM

## 2021-01-02 DIAGNOSIS — I872 Venous insufficiency (chronic) (peripheral): Secondary | ICD-10-CM | POA: Diagnosis present

## 2021-01-02 DIAGNOSIS — M62838 Other muscle spasm: Secondary | ICD-10-CM | POA: Diagnosis present

## 2021-01-02 DIAGNOSIS — I5032 Chronic diastolic (congestive) heart failure: Secondary | ICD-10-CM | POA: Diagnosis present

## 2021-01-02 DIAGNOSIS — F32A Depression, unspecified: Secondary | ICD-10-CM | POA: Diagnosis present

## 2021-01-02 DIAGNOSIS — Z90710 Acquired absence of both cervix and uterus: Secondary | ICD-10-CM

## 2021-01-02 DIAGNOSIS — R918 Other nonspecific abnormal finding of lung field: Secondary | ICD-10-CM | POA: Diagnosis not present

## 2021-01-02 DIAGNOSIS — W1809XA Striking against other object with subsequent fall, initial encounter: Secondary | ICD-10-CM | POA: Diagnosis present

## 2021-01-02 DIAGNOSIS — S2249XA Multiple fractures of ribs, unspecified side, initial encounter for closed fracture: Secondary | ICD-10-CM

## 2021-01-02 DIAGNOSIS — R0902 Hypoxemia: Secondary | ICD-10-CM | POA: Diagnosis not present

## 2021-01-02 DIAGNOSIS — M4854XA Collapsed vertebra, not elsewhere classified, thoracic region, initial encounter for fracture: Secondary | ICD-10-CM | POA: Diagnosis not present

## 2021-01-02 DIAGNOSIS — J942 Hemothorax: Secondary | ICD-10-CM

## 2021-01-02 DIAGNOSIS — Z20822 Contact with and (suspected) exposure to covid-19: Secondary | ICD-10-CM | POA: Diagnosis present

## 2021-01-02 DIAGNOSIS — Z043 Encounter for examination and observation following other accident: Secondary | ICD-10-CM | POA: Diagnosis not present

## 2021-01-02 DIAGNOSIS — M898X8 Other specified disorders of bone, other site: Secondary | ICD-10-CM | POA: Diagnosis not present

## 2021-01-02 DIAGNOSIS — W19XXXA Unspecified fall, initial encounter: Secondary | ICD-10-CM | POA: Diagnosis not present

## 2021-01-02 DIAGNOSIS — Z9689 Presence of other specified functional implants: Secondary | ICD-10-CM

## 2021-01-02 DIAGNOSIS — M899 Disorder of bone, unspecified: Secondary | ICD-10-CM | POA: Diagnosis not present

## 2021-01-02 DIAGNOSIS — R0603 Acute respiratory distress: Secondary | ICD-10-CM

## 2021-01-02 DIAGNOSIS — J439 Emphysema, unspecified: Secondary | ICD-10-CM | POA: Diagnosis not present

## 2021-01-02 DIAGNOSIS — Z6838 Body mass index (BMI) 38.0-38.9, adult: Secondary | ICD-10-CM | POA: Diagnosis not present

## 2021-01-02 DIAGNOSIS — M810 Age-related osteoporosis without current pathological fracture: Secondary | ICD-10-CM | POA: Diagnosis present

## 2021-01-02 DIAGNOSIS — S2241XA Multiple fractures of ribs, right side, initial encounter for closed fracture: Secondary | ICD-10-CM | POA: Diagnosis present

## 2021-01-02 DIAGNOSIS — J449 Chronic obstructive pulmonary disease, unspecified: Secondary | ICD-10-CM | POA: Diagnosis present

## 2021-01-02 DIAGNOSIS — M549 Dorsalgia, unspecified: Secondary | ICD-10-CM | POA: Diagnosis not present

## 2021-01-02 DIAGNOSIS — G9341 Metabolic encephalopathy: Secondary | ICD-10-CM | POA: Diagnosis not present

## 2021-01-02 DIAGNOSIS — I5033 Acute on chronic diastolic (congestive) heart failure: Secondary | ICD-10-CM | POA: Diagnosis not present

## 2021-01-02 DIAGNOSIS — M79605 Pain in left leg: Secondary | ICD-10-CM | POA: Diagnosis not present

## 2021-01-02 DIAGNOSIS — S2241XD Multiple fractures of ribs, right side, subsequent encounter for fracture with routine healing: Secondary | ICD-10-CM | POA: Diagnosis not present

## 2021-01-02 DIAGNOSIS — Z9079 Acquired absence of other genital organ(s): Secondary | ICD-10-CM

## 2021-01-02 DIAGNOSIS — R197 Diarrhea, unspecified: Secondary | ICD-10-CM | POA: Diagnosis present

## 2021-01-02 DIAGNOSIS — M2550 Pain in unspecified joint: Secondary | ICD-10-CM | POA: Diagnosis not present

## 2021-01-02 DIAGNOSIS — R0689 Other abnormalities of breathing: Secondary | ICD-10-CM | POA: Diagnosis not present

## 2021-01-02 DIAGNOSIS — S32011A Stable burst fracture of first lumbar vertebra, initial encounter for closed fracture: Secondary | ICD-10-CM | POA: Diagnosis present

## 2021-01-02 DIAGNOSIS — Z9981 Dependence on supplemental oxygen: Secondary | ICD-10-CM

## 2021-01-02 DIAGNOSIS — S270XXA Traumatic pneumothorax, initial encounter: Secondary | ICD-10-CM

## 2021-01-02 DIAGNOSIS — S2239XA Fracture of one rib, unspecified side, initial encounter for closed fracture: Secondary | ICD-10-CM | POA: Diagnosis not present

## 2021-01-02 DIAGNOSIS — Z4682 Encounter for fitting and adjustment of non-vascular catheter: Secondary | ICD-10-CM | POA: Diagnosis not present

## 2021-01-02 DIAGNOSIS — G8929 Other chronic pain: Secondary | ICD-10-CM | POA: Diagnosis present

## 2021-01-02 DIAGNOSIS — I959 Hypotension, unspecified: Secondary | ICD-10-CM | POA: Diagnosis not present

## 2021-01-02 DIAGNOSIS — D1809 Hemangioma of other sites: Secondary | ICD-10-CM | POA: Diagnosis not present

## 2021-01-02 DIAGNOSIS — M40204 Unspecified kyphosis, thoracic region: Secondary | ICD-10-CM | POA: Diagnosis not present

## 2021-01-02 DIAGNOSIS — S0990XA Unspecified injury of head, initial encounter: Secondary | ICD-10-CM | POA: Diagnosis not present

## 2021-01-02 DIAGNOSIS — S2243XA Multiple fractures of ribs, bilateral, initial encounter for closed fracture: Secondary | ICD-10-CM | POA: Diagnosis not present

## 2021-01-02 DIAGNOSIS — J9621 Acute and chronic respiratory failure with hypoxia: Secondary | ICD-10-CM | POA: Diagnosis present

## 2021-01-02 DIAGNOSIS — E662 Morbid (severe) obesity with alveolar hypoventilation: Secondary | ICD-10-CM | POA: Diagnosis present

## 2021-01-02 DIAGNOSIS — K219 Gastro-esophageal reflux disease without esophagitis: Secondary | ICD-10-CM | POA: Diagnosis present

## 2021-01-02 DIAGNOSIS — L89302 Pressure ulcer of unspecified buttock, stage 2: Secondary | ICD-10-CM | POA: Diagnosis present

## 2021-01-02 DIAGNOSIS — I110 Hypertensive heart disease with heart failure: Secondary | ICD-10-CM | POA: Diagnosis present

## 2021-01-02 DIAGNOSIS — S272XXA Traumatic hemopneumothorax, initial encounter: Principal | ICD-10-CM | POA: Diagnosis present

## 2021-01-02 DIAGNOSIS — S2231XA Fracture of one rib, right side, initial encounter for closed fracture: Secondary | ICD-10-CM | POA: Diagnosis not present

## 2021-01-02 DIAGNOSIS — S0081XA Abrasion of other part of head, initial encounter: Secondary | ICD-10-CM | POA: Diagnosis present

## 2021-01-02 DIAGNOSIS — L089 Local infection of the skin and subcutaneous tissue, unspecified: Secondary | ICD-10-CM

## 2021-01-02 DIAGNOSIS — I700 Atherosclerosis of aorta: Secondary | ICD-10-CM | POA: Diagnosis not present

## 2021-01-02 DIAGNOSIS — D62 Acute posthemorrhagic anemia: Secondary | ICD-10-CM | POA: Diagnosis present

## 2021-01-02 DIAGNOSIS — J939 Pneumothorax, unspecified: Secondary | ICD-10-CM

## 2021-01-02 DIAGNOSIS — J984 Other disorders of lung: Secondary | ICD-10-CM | POA: Diagnosis not present

## 2021-01-02 DIAGNOSIS — W1800XA Striking against unspecified object with subsequent fall, initial encounter: Secondary | ICD-10-CM | POA: Diagnosis present

## 2021-01-02 DIAGNOSIS — Z87891 Personal history of nicotine dependence: Secondary | ICD-10-CM

## 2021-01-02 DIAGNOSIS — J9811 Atelectasis: Secondary | ICD-10-CM | POA: Diagnosis present

## 2021-01-02 DIAGNOSIS — Z8616 Personal history of COVID-19: Secondary | ICD-10-CM

## 2021-01-02 DIAGNOSIS — L8990 Pressure ulcer of unspecified site, unspecified stage: Secondary | ICD-10-CM | POA: Diagnosis present

## 2021-01-02 DIAGNOSIS — G4733 Obstructive sleep apnea (adult) (pediatric): Secondary | ICD-10-CM | POA: Diagnosis not present

## 2021-01-02 DIAGNOSIS — M79602 Pain in left arm: Secondary | ICD-10-CM | POA: Diagnosis not present

## 2021-01-02 DIAGNOSIS — S3991XA Unspecified injury of abdomen, initial encounter: Secondary | ICD-10-CM | POA: Diagnosis not present

## 2021-01-02 DIAGNOSIS — I517 Cardiomegaly: Secondary | ICD-10-CM | POA: Diagnosis not present

## 2021-01-02 DIAGNOSIS — J90 Pleural effusion, not elsewhere classified: Secondary | ICD-10-CM | POA: Diagnosis present

## 2021-01-02 DIAGNOSIS — Z981 Arthrodesis status: Secondary | ICD-10-CM

## 2021-01-02 DIAGNOSIS — Z79899 Other long term (current) drug therapy: Secondary | ICD-10-CM

## 2021-01-02 DIAGNOSIS — S270XXD Traumatic pneumothorax, subsequent encounter: Secondary | ICD-10-CM | POA: Diagnosis not present

## 2021-01-02 DIAGNOSIS — J9620 Acute and chronic respiratory failure, unspecified whether with hypoxia or hypercapnia: Secondary | ICD-10-CM | POA: Diagnosis not present

## 2021-01-02 DIAGNOSIS — Z7401 Bed confinement status: Secondary | ICD-10-CM | POA: Diagnosis not present

## 2021-01-02 DIAGNOSIS — R4182 Altered mental status, unspecified: Secondary | ICD-10-CM

## 2021-01-02 DIAGNOSIS — R0781 Pleurodynia: Secondary | ICD-10-CM | POA: Diagnosis not present

## 2021-01-02 DIAGNOSIS — J811 Chronic pulmonary edema: Secondary | ICD-10-CM | POA: Diagnosis not present

## 2021-01-02 DIAGNOSIS — R0602 Shortness of breath: Secondary | ICD-10-CM | POA: Diagnosis not present

## 2021-01-02 DIAGNOSIS — I2510 Atherosclerotic heart disease of native coronary artery without angina pectoris: Secondary | ICD-10-CM | POA: Diagnosis not present

## 2021-01-02 DIAGNOSIS — S2241XG Multiple fractures of ribs, right side, subsequent encounter for fracture with delayed healing: Secondary | ICD-10-CM | POA: Diagnosis not present

## 2021-01-02 DIAGNOSIS — J9311 Primary spontaneous pneumothorax: Secondary | ICD-10-CM | POA: Diagnosis not present

## 2021-01-02 DIAGNOSIS — R531 Weakness: Secondary | ICD-10-CM | POA: Diagnosis not present

## 2021-01-02 DIAGNOSIS — D649 Anemia, unspecified: Secondary | ICD-10-CM | POA: Diagnosis not present

## 2021-01-02 LAB — CBC WITH DIFFERENTIAL/PLATELET
Abs Immature Granulocytes: 0.23 10*3/uL — ABNORMAL HIGH (ref 0.00–0.07)
Basophils Absolute: 0.1 10*3/uL (ref 0.0–0.1)
Basophils Relative: 1 %
Eosinophils Absolute: 0.4 10*3/uL (ref 0.0–0.5)
Eosinophils Relative: 4 %
HCT: 36.9 % (ref 36.0–46.0)
Hemoglobin: 11.5 g/dL — ABNORMAL LOW (ref 12.0–15.0)
Immature Granulocytes: 2 %
Lymphocytes Relative: 13 %
Lymphs Abs: 1.6 10*3/uL (ref 0.7–4.0)
MCH: 28.2 pg (ref 26.0–34.0)
MCHC: 31.2 g/dL (ref 30.0–36.0)
MCV: 90.4 fL (ref 80.0–100.0)
Monocytes Absolute: 0.6 10*3/uL (ref 0.1–1.0)
Monocytes Relative: 5 %
Neutro Abs: 9.8 10*3/uL — ABNORMAL HIGH (ref 1.7–7.7)
Neutrophils Relative %: 75 %
Platelets: 74 10*3/uL — ABNORMAL LOW (ref 150–400)
RBC: 4.08 MIL/uL (ref 3.87–5.11)
RDW: 14.6 % (ref 11.5–15.5)
WBC: 12.7 10*3/uL — ABNORMAL HIGH (ref 4.0–10.5)
nRBC: 0 % (ref 0.0–0.2)

## 2021-01-02 LAB — URINALYSIS, ROUTINE W REFLEX MICROSCOPIC
Bilirubin Urine: NEGATIVE
Glucose, UA: NEGATIVE mg/dL
Hgb urine dipstick: NEGATIVE
Ketones, ur: NEGATIVE mg/dL
Leukocytes,Ua: NEGATIVE
Nitrite: NEGATIVE
Protein, ur: NEGATIVE mg/dL
Specific Gravity, Urine: 1.009 (ref 1.005–1.030)
pH: 5 (ref 5.0–8.0)

## 2021-01-02 LAB — BASIC METABOLIC PANEL
Anion gap: 9 (ref 5–15)
BUN: 20 mg/dL (ref 8–23)
CO2: 37 mmol/L — ABNORMAL HIGH (ref 22–32)
Calcium: 8.6 mg/dL — ABNORMAL LOW (ref 8.9–10.3)
Chloride: 95 mmol/L — ABNORMAL LOW (ref 98–111)
Creatinine, Ser: 0.61 mg/dL (ref 0.44–1.00)
GFR, Estimated: >60 mL/min (ref >60)
Glucose, Bld: 139 mg/dL — ABNORMAL HIGH (ref 70–99)
Potassium: 4 mmol/L (ref 3.5–5.1)
Sodium: 141 mmol/L (ref 135–145)

## 2021-01-02 LAB — RESP PANEL BY RT-PCR (FLU A&B, COVID) ARPGX2
Influenza A by PCR: NEGATIVE
Influenza B by PCR: NEGATIVE
SARS Coronavirus 2 by RT PCR: NEGATIVE

## 2021-01-02 MED ORDER — MORPHINE SULFATE (PF) 2 MG/ML IV SOLN
2.0000 mg | Freq: Once | INTRAVENOUS | Status: AC
  Administered 2021-01-02: 2 mg via INTRAVENOUS
  Filled 2021-01-02: qty 1

## 2021-01-02 MED ORDER — METHOCARBAMOL 1000 MG/10ML IJ SOLN
500.0000 mg | Freq: Once | INTRAVENOUS | Status: AC
  Administered 2021-01-02: 500 mg via INTRAVENOUS
  Filled 2021-01-02: qty 5

## 2021-01-02 MED ORDER — FENTANYL CITRATE (PF) 100 MCG/2ML IJ SOLN
50.0000 ug | Freq: Once | INTRAMUSCULAR | Status: AC
  Administered 2021-01-02: 50 ug via INTRAVENOUS
  Filled 2021-01-02: qty 2

## 2021-01-02 NOTE — ED Notes (Signed)
Pure wick placed.

## 2021-01-02 NOTE — ED Triage Notes (Signed)
The pt arrived by gems from home she sleppied and fell in the br in some water that the pt spilled  She arrived with a c-collar in place c/o her enter spine painful both her lower legs are banmdaged  She moans with any movement.  She lives with her daughter who apparently is not coming in.  Iv per gems  They gave her fentanyl 167mcgs on the way here

## 2021-01-02 NOTE — ED Notes (Signed)
To ct

## 2021-01-02 NOTE — H&P (Signed)
History   [name] is an 70 y.o. female.   Chief Complaint:  Chief Complaint  Patient presents with  . Fall    Pt is a 70 yo F who sustained a fall today in her bathroom.  She has significant mobility issues and lives with her daughter.  She uses a bedside commode, but her daughter has had issues emptying it in the evenings as it has been very heavy.  The patient has been trying to empty it multiple times per day and has been rinsing it out in order to minimize this difficulty.  This time, she was on the second rinse out and spilled some of the water onto the floor.  She slipped and fell backwards, striking the edge of the bathtub with her lower right chest.  She did not have any LOC.  She denies anterior chest pain, abd pain, nausea/vomiting.  ED workup showed right rib fractures and trace PTX.  She is on home oxygen and has ITP.  She has chronic lower leg wounds as well that she has dressings on.      Past Medical History:  Diagnosis Date  . Arthritis    lumbar burst fx, osteoporosis  . Back pain 09/29/2013  . Blood dyscrasia   . Chest pain    denies  . Chronic kidney disease    passed stones spontaneously- 40 yrs. ago   . Depression    states her current situation with activity limitations, she has a low spirit at times.   Marland Kitchen Dysrhythmia    occ tachycardia  . Fracture of vertebra, compression ([place])   . GERD (gastroesophageal reflux disease)    occ  . History of kidney stones   . HTN (hypertension)   . ITP (idiopathic thrombocytopenic purpura) 08/29/2013  . Leukocytosis, unspecified 09/05/2013  . Muscle cramp 11/16/2013  . Tachycardia    occ upon exersion, and when put on bp med    Past Surgical History:  Procedure Laterality Date  . ABDOMINAL HYSTERECTOMY    . ANTERIOR LAT LUMBAR FUSION N/A 03/28/2014   Procedure: Anterolateral decompression of L1 fracture with posterior segmental stabilization;  Surgeon: Kristeen Miss, MD;  Location: [place] [facility];  Service:  Neurosurgery;  Laterality: N/A;  Anterolateral decompression of Lumbar One fracture with posterior segmental stabilization  . CESAREAN SECTION     x2  . CHEST TUBE INSERTION Left 03/28/2014   Procedure: CHEST TUBE INSERTION;  Surgeon: Ivin Poot, MD;  Location: [facility];  Service: Thoracic;  Laterality: Left;  . INCISIONAL HERNIA REPAIR N/A 11/13/2014   Procedure: PRIMARY REPAIR INCARCERATED INCISIONAL HERNIA WITH PLACEMENT BIOLOGIC MESH;  Surgeon: Rolm Bookbinder, MD;  Location: [place];  Service: General;  Laterality: N/A;  . LAPAROTOMY Right 09   ovarian cyst  . TOTAL ABDOMINAL HYSTERECTOMY W/ BILATERAL SALPINGOOPHORECTOMY  70    Family History  Problem Relation Age of Onset  . Hypertension Other   . Heart attack Other   . Leukemia Paternal Uncle        leukemia  . Heart attack Mother   . Hypertension Mother   . Heart disease Father    Social History:  reports that she quit smoking about 6 years ago. Her smoking use included cigarettes. She has a 15.00 pack-year smoking history. She has never used smokeless tobacco. She reports current alcohol use. She reports that she does not use drugs.  Allergies  No Known Allergies  Home Medications  (Not in a hospital admission)  Trauma Course   Results for orders placed or performed during the hospital encounter of 01/02/21 (from the past 48 hour(s))  CBC with Differential     Status: Abnormal   Collection Time: 01/02/21  3:48 PM  Result Value Ref Range   WBC 12.7 (H) 4.0 - 10.5 K/uL   RBC 4.08 3.87 - 5.11 MIL/uL   Hemoglobin 11.5 (L) 12.0 - 15.0 g/dL   HCT 36.9 36.0 - 46.0 %   MCV 90.4 80.0 - 100.0 fL   MCH 28.2 26.0 - 34.0 pg   MCHC 31.2 30.0 - 36.0 g/dL   RDW 14.6 11.5 - 15.5 %   Platelets 74 (L) 150 - 400 K/uL    Comment: Immature Platelet Fraction may be clinically indicated, consider ordering this additional test [id number] REPEATED TO VERIFY PLATELET COUNT CONFIRMED BY SMEAR    nRBC 0.0 0.0 - 0.2 %    Neutrophils Relative % 75 %   Neutro Abs 9.8 (H) 1.7 - 7.7 K/uL   Lymphocytes Relative 13 %   Lymphs Abs 1.6 0.7 - 4.0 K/uL   Monocytes Relative 5 %   Monocytes Absolute 0.6 0.1 - 1.0 K/uL   Eosinophils Relative 4 %   Eosinophils Absolute 0.4 0.0 - 0.5 K/uL   Basophils Relative 1 %   Basophils Absolute 0.1 0.0 - 0.1 K/uL   Immature Granulocytes 2 %   Abs Immature Granulocytes 0.23 (H) 0.00 - 0.07 K/uL    Comment: Performed at [facility], [address], [place], [place] [address]  Basic metabolic panel     Status: Abnormal   Collection Time: 01/02/21  3:48 PM  Result Value Ref Range   Sodium 141 135 - 145 mmol/L   Potassium 4.0 3.5 - 5.1 mmol/L   Chloride 95 (L) 98 - 111 mmol/L   CO2 37 (H) 22 - 32 mmol/L   Glucose, Bld 139 (H) 70 - 99 mg/dL    Comment: Glucose reference range applies only to samples taken after fasting for at least 8 hours.   BUN 20 8 - 23 mg/dL   Creatinine, Ser 0.61 0.44 - 1.00 mg/dL   Calcium 8.6 (L) 8.9 - 10.3 mg/dL   GFR, Estimated >60 >60 mL/min    Comment: (NOTE) Calculated using the CKD-EPI Creatinine Equation (2021)    Anion gap 9 5 - 15    Comment: Performed at [facility] [address] [place], [place] [address]  Urinalysis, Routine w reflex microscopic     Status: Abnormal   Collection Time: 01/02/21  3:48 PM  Result Value Ref Range   Color, Urine STRAW (A) YELLOW   APPearance CLEAR CLEAR   Specific Gravity, Urine 1.009 1.005 - 1.030   pH 5.0 5.0 - 8.0   Glucose, UA NEGATIVE NEGATIVE mg/dL   Hgb urine dipstick NEGATIVE NEGATIVE   Bilirubin Urine NEGATIVE NEGATIVE   Ketones, ur NEGATIVE NEGATIVE mg/dL   Protein, ur NEGATIVE NEGATIVE mg/dL   Nitrite NEGATIVE NEGATIVE   Leukocytes,Ua NEGATIVE NEGATIVE    Comment: Performed at [facility] [address], [place], [place] [address]   CT Head Wo Contrast  Result Date: 01/02/2021 CLINICAL DATA:  70 year old female with fall. EXAM: CT HEAD WITHOUT CONTRAST CT CERVICAL  SPINE WITHOUT CONTRAST TECHNIQUE: Multidetector CT imaging of the head and cervical spine was performed following the standard protocol without intravenous contrast. Multiplanar CT image reconstructions of the cervical spine were also generated. COMPARISON:  Head CT dated 05/13/2020.  FINDINGS: CT HEAD FINDINGS Brain: The ventricles and sulci appropriate size for patient's age. The gray-white matter discrimination is preserved. There is no acute intracranial hemorrhage. No mass effect or midline shift. No extra-axial fluid collection. Vascular: No hyperdense vessel or unexpected calcification. Skull: Normal. Negative for fracture or focal lesion. Sinuses/Orbits: Mild mucoperiosteal thickening of paranasal sinuses. No air-fluid level. Mastoid air cells are clear. Other: None CT CERVICAL SPINE FINDINGS Alignment: No acute subluxation. Skull base and vertebrae: No acute fracture. Osteopenia. Soft tissues and spinal canal: No prevertebral fluid or swelling. No visible canal hematoma. Disc levels:  No acute findings. Upper chest: Mild emphysema. Minimal right apical pneumothorax. Please see report for the chest CT. Other: None IMPRESSION: 1. No acute intracranial pathology. 2. No acute/traumatic cervical spine pathology. 3. Minimal right apical pneumothorax. Please see report for the chest CT. Electronically Signed   By: Anner Crete M.D.   On: 01/02/2021 16:46   CT Chest Wo Contrast  Result Date: 01/02/2021 CLINICAL DATA:  Suspected rib fracture.  Fall. EXAM: CT CHEST WITHOUT CONTRAST TECHNIQUE: Multidetector CT imaging of the chest was performed following the standard protocol without IV contrast. COMPARISON:  09/26/2020 FINDINGS: Cardiovascular: Tortuous aorta. No aneurysm. Scattered coronary artery and aortic calcifications. Heart is borderline in size. Mediastinum/Nodes: No mediastinal, hilar, or axillary adenopathy. Trachea and esophagus are unremarkable. Bilateral thyroid nodules, the largest 12 mm in the  right thyroid lobe. Not clinically significant; no follow-up imaging recommended (ref: J Am Coll Radiol. 2015 Feb;12(2): 143-50). Lungs/Pleura: Linear areas of scarring or atelectasis in the mid and lower lungs bilaterally. No effusions. Small right pneumothorax noted in the lower chest. Upper Abdomen: Low-density lesion posteriorly in the liver measures 3.8 cm, similar to prior study. No acute findings. Musculoskeletal: Posterior right 9th and 10th rib fractures noted. No additional acute bony abnormality. Chest wall soft tissues are unremarkable. IMPRESSION: Posterior right 9th and 10th rib fractures. Tiny right basilar pneumothorax. Areas of atelectasis or scarring in the mid and lower lungs. Coronary artery disease. Aortic Atherosclerosis (ICD10-I70.0). Electronically Signed   By: Rolm Baptise M.D.   On: 01/02/2021 16:50   CT Cervical Spine Wo Contrast  Result Date: 01/02/2021 CLINICAL DATA:  70 year old female with fall. EXAM: CT HEAD WITHOUT CONTRAST CT CERVICAL SPINE WITHOUT CONTRAST TECHNIQUE: Multidetector CT imaging of the head and cervical spine was performed following the standard protocol without intravenous contrast. Multiplanar CT image reconstructions of the cervical spine were also generated. COMPARISON:  Head CT dated 05/13/2020. FINDINGS: CT HEAD FINDINGS Brain: The ventricles and sulci appropriate size for patient's age. The gray-white matter discrimination is preserved. There is no acute intracranial hemorrhage. No mass effect or midline shift. No extra-axial fluid collection. Vascular: No hyperdense vessel or unexpected calcification. Skull: Normal. Negative for fracture or focal lesion. Sinuses/Orbits: Mild mucoperiosteal thickening of paranasal sinuses. No air-fluid level. Mastoid air cells are clear. Other: None CT CERVICAL SPINE FINDINGS Alignment: No acute subluxation. Skull base and vertebrae: No acute fracture. Osteopenia. Soft tissues and spinal canal: No prevertebral fluid or  swelling. No visible canal hematoma. Disc levels:  No acute findings. Upper chest: Mild emphysema. Minimal right apical pneumothorax. Please see report for the chest CT. Other: None IMPRESSION: 1. No acute intracranial pathology. 2. No acute/traumatic cervical spine pathology. 3. Minimal right apical pneumothorax. Please see report for the chest CT. Electronically Signed   By: Anner Crete M.D.   On: 01/02/2021 16:46    Review of Systems  Constitutional: Positive for fatigue. Negative for  activity change, appetite change, chills, diaphoresis and fever.  HENT: Negative.   Eyes: Negative.   Respiratory: Positive for shortness of breath.   Cardiovascular: Positive for chest pain (new posterior pain where hit the bathtub).  Gastrointestinal: Positive for diarrhea (frequent).  Endocrine: Negative.   Genitourinary: Positive for frequency.  Musculoskeletal: Positive for back pain.  Skin: Positive for rash and wound.  Allergic/Immunologic: Negative.   Neurological: Negative.   Hematological: Bruises/bleeds easily.  Psychiatric/Behavioral: Positive for dysphoric mood.    Blood pressure 119/66, pulse 99, temperature 98.2 F (36.8 C), temperature source Oral, resp. rate (!) 21, height 5' (1.524 m), weight 89.6 kg, SpO2 93 %. Physical Exam Constitutional:      General: She is in acute distress.     Appearance: She is obese. She is ill-appearing. She is not toxic-appearing or diaphoretic.  HENT:     Head: Normocephalic.     Comments: Chin abrasion from cervical collar, not fall    Right Ear: External ear normal.     Left Ear: External ear normal.     Nose: Nose normal.     Mouth/Throat:     Mouth: Mucous membranes are moist.     Pharynx: No oropharyngeal exudate or posterior oropharyngeal erythema.  Eyes:     General:        Right eye: No discharge.        Left eye: No discharge.     Extraocular Movements: Extraocular movements intact.     Conjunctiva/sclera: Conjunctivae normal.      Pupils: Pupils are equal, round, and reactive to light.  Cardiovascular:     Rate and Rhythm: Normal rate and regular rhythm.     Heart sounds: Normal heart sounds. No murmur heard.   Pulmonary:     Effort: Pulmonary effort is normal.     Breath sounds: No stridor. No wheezing.     Comments: Slightly shallow respirations Chest:     Chest wall: Tenderness present.  Abdominal:     General: There is no distension.     Palpations: Abdomen is soft. There is no mass.     Tenderness: There is no abdominal tenderness. There is no guarding or rebound.     Hernia: A hernia (reducible umbilical hernia) is present.     Comments: protuberant  Musculoskeletal:        General: Swelling present. No tenderness or signs of injury.     Cervical back: Normal range of motion and neck supple. No tenderness.     Right lower leg: Edema present.     Left lower leg: Edema present.  Skin:    General: Skin is warm and dry.     Capillary Refill: Capillary refill takes 2 to 3 seconds.     Coloration: Skin is pale.     Comments: Wraps on B lower legs for chronic wounds.    Neurological:     General: No focal deficit present.     Mental Status: She is alert and oriented to person, place, and time.     Sensory: No sensory deficit.  Psychiatric:        Mood and Affect: Mood normal.        Behavior: Behavior normal.        Thought Content: Thought content normal.        Judgment: Judgment normal.     Assessment/Plan Fall Right 9-10 posterior rib fractures Trace right PTX  COVID pneumonia 06/2020 ITP GERD Chronic CHF Chronic respiratory failure with  hypoxia and hypercapnia BLE wounds from venous stasis dermatitis  Ventral hernia Old thoracic and lumbar compression fractures Gallstones   Pain control Pulmonary toilet Internal medicine consult PT/OT consult May end up needing pulmonary consult Pt may end up needing SW consult as well  Stark Klein 01/02/2021, 8:07 PM   Procedures

## 2021-01-02 NOTE — ED Provider Notes (Signed)
[facility] EMERGENCY DEPARTMENT Provider Note   CSN: [id number] Arrival date & time: 01/02/21  1531     History Chief Complaint  Patient presents with  . Fall    [name] is a 70 y.o. female.  For evaluation of injuries after a fall.  She said she spilled some water on the floor in the bathroom and then slipped and fell striking her lateral posterior right chest into the bathtub.  No loss of consciousness.  Complaining of 8 of 10 pain in her ribs worse with movement.  Denies any head or neck injury.  No loss of consciousness.  Not on any blood thinners.  Chronic lower extremity edema for which she has visiting nurses supplying leg wraps to her legs.  She is normally on 3 L of nasal cannula oxygen.  The history is provided by the patient.  Fall This is a new problem. The current episode started less than 1 hour ago. The problem occurs rarely. The problem has not changed since onset.Associated symptoms include chest pain and shortness of breath. Pertinent negatives include no abdominal pain and no headaches. The symptoms are aggravated by bending and twisting. Nothing relieves the symptoms. She has tried nothing for the symptoms. The treatment provided no relief.       Past Medical History:  Diagnosis Date  . Arthritis    lumbar burst fx, osteoporosis  . Back pain 09/29/2013  . Blood dyscrasia   . Chest pain    denies  . Chronic kidney disease    passed stones spontaneously- 40 yrs. ago   . Depression    states her current situation with activity limitations, she has a low spirit at times.   Marland Kitchen Dysrhythmia    occ tachycardia  . Fracture of vertebra, compression ([place])   . GERD (gastroesophageal reflux disease)    occ  . History of kidney stones   . HTN (hypertension)   . ITP (idiopathic thrombocytopenic purpura) 08/29/2013  . Leukocytosis, unspecified 09/05/2013  . Muscle cramp 11/16/2013  . Tachycardia    occ upon exersion, and when put on bp med     Patient Active Problem List   Diagnosis Date Noted  . Sepsis ([place]) 09/26/2020  . Pneumonia due to COVID-19 virus 07/04/2020  . Respiratory failure ([place]) 06/30/2020  . Respiratory failure with hypoxia ([place]) 06/29/2020  . Shock ([place]) 05/14/2020  . Chronic, continuous use of opioids 05/14/2020  . Opioid overdose ([place]) 05/14/2020  . Acute on chronic respiratory failure with hypoxia and hypercapnia ([place]) 05/14/2020  . Pressure injury of skin 04/22/2020  . Sepsis due to cellulitis ([place]) 04/18/2020  . Venous stasis dermatitis of both lower extremities 02/25/2019  . Encounter for preventive care 10/20/2018  . Chronic respiratory failure with hypoxia and hypercapnia ([place]) 10/06/2018  . Polycythemia, secondary 10/05/2018  . Obesity hypoventilation syndrome ([place]) 10/04/2018  . AKI (acute kidney injury) ([place])   . BMI 40.0-44.9, adult ([place])   . Bilateral lower extremity edema   . Venous stasis dermatitis 09/30/2018  . Wound of lower extremity, bilateral 09/30/2018  . Chronic heart failure with preserved ejection fraction with moderate RV failure 06/09/2017  . GERD (gastroesophageal reflux disease) 11/13/2014  . Opiate-induced constipation 11/13/2014  . History of tobacco abuse 03/10/2014  . Chronic back pain secondary to traumatic L1 fracture 09/29/2013  . Chronic idiopathic thrombocytopenia ([place]) 08/29/2013  . HTN (hypertension)     Past Surgical History:  Procedure Laterality Date  . ABDOMINAL HYSTERECTOMY    .  ANTERIOR LAT LUMBAR FUSION N/A 03/28/2014   Procedure: Anterolateral decompression of L1 fracture with posterior segmental stabilization;  Surgeon: Kristeen Miss, MD;  Location: [facility];  Service: Neurosurgery;  Laterality: N/A;  Anterolateral decompression of Lumbar One fracture with posterior segmental stabilization  . CESAREAN SECTION     x2  . CHEST TUBE INSERTION Left 03/28/2014   Procedure: CHEST TUBE INSERTION;  Surgeon: Ivin Poot, MD;  Location: [facility];   Service: Thoracic;  Laterality: Left;  . INCISIONAL HERNIA REPAIR N/A 11/13/2014   Procedure: PRIMARY REPAIR INCARCERATED INCISIONAL HERNIA WITH PLACEMENT BIOLOGIC MESH;  Surgeon: Rolm Bookbinder, MD;  Location: [facility];  Service: General;  Laterality: N/A;  . LAPAROTOMY Right 09   ovarian cyst  . TOTAL ABDOMINAL HYSTERECTOMY W/ BILATERAL SALPINGOOPHORECTOMY  97     OB History   No obstetric history on file.     Family History  Problem Relation Age of Onset  . Hypertension Other   . Heart attack Other   . Leukemia Paternal Uncle        leukemia  . Heart attack Mother   . Hypertension Mother   . Heart disease Father     Social History   Tobacco Use  . Smoking status: Former Smoker    Packs/day: 1.00    Years: 15.00    Pack years: 15.00    Types: Cigarettes    Quit date: 09/15/2014    Years since quitting: 6.3  . Smokeless tobacco: Never Used  Vaping Use  . Vaping Use: Never used  Substance Use Topics  . Alcohol use: Yes    Comment: rare  . Drug use: No    Home Medications Prior to Admission medications   Medication Sig Start Date End Date Taking? Authorizing Provider  Bismuth Tribromoph-Petrolatum (XEROFORM PETROLAT GAUZE 5"X9") MISC Apply 1 application topically daily. 09/30/20   Seawell, Jaimie A, DO  carvedilol (COREG) 3.125 MG tablet Take 1 tablet (3.125 mg total) by mouth 2 (two) times daily with a meal. 10/04/20   [name], MD  diclofenac Sodium (VOLTAREN) 1 % GEL Apply 4 g topically 4 (four) times daily as needed (knee pain). Patient not taking: No sig reported 05/18/20   Andrew Au, MD  furosemide (LASIX) 80 MG tablet Take 1 tablet (80 mg total) by mouth See admin instructions. Take 80mg  by mouth once daily.  Take an additional 80mg  daily as needed for weight gain of 3 pounds in one week or 5 pounds in one day. 10/02/20   Harvie Heck, MD  loperamide (IMODIUM) 2 MG capsule Take 1 capsule (2 mg total) by mouth as needed for diarrhea or loose stools. 05/18/20    Andrew Au, MD  potassium chloride (KLOR-CON) 10 MEQ tablet TAKE 1 TABLET(10 MEQ) BY MOUTH DAILY 10/04/20   Harvie Heck, MD  promethazine (PHENERGAN) 12.5 MG tablet Take 12.5 mg by mouth every 6 (six) hours as needed for nausea or vomiting.    [provider]  Silver-Carboxymethylcellulose (AQUACEL AG ADVANTAGE) 4"X5" PADS Apply 1 application topically daily. 09/30/20   Seawell, Jaimie A, DO  vitamin B-12 1000 MCG tablet Take 1 tablet (1,000 mcg total) by mouth daily. 10/01/20   Seawell, Jaimie A, DO    Allergies    Patient has no known allergies.  Review of Systems   Review of Systems  Constitutional: Negative for fever.  HENT: Negative for sore throat.   Eyes: Negative for visual disturbance.  Respiratory: Positive for shortness of  breath.   Cardiovascular: Positive for chest pain and leg swelling.  Gastrointestinal: Negative for abdominal pain.  Genitourinary: Negative for dysuria.  Musculoskeletal: Negative for neck pain.  Skin: Negative for rash.  Neurological: Negative for headaches.    Physical Exam Updated Vital Signs BP 130/70 (BP Location: Right Arm)   Pulse 88   Temp 98.2 F (36.8 C) (Oral)   Resp (!) 21   SpO2 94%   Physical Exam Vitals and nursing note reviewed.  Constitutional:      General: She is not in acute distress.    Appearance: She is well-developed.  HENT:     Head: Normocephalic and atraumatic.  Eyes:     Conjunctiva/sclera: Conjunctivae normal.  Cardiovascular:     Rate and Rhythm: Normal rate and regular rhythm.     Heart sounds: No murmur heard.   Pulmonary:     Effort: Pulmonary effort is normal. No respiratory distress.     Breath sounds: Normal breath sounds.  Abdominal:     Palpations: Abdomen is soft.     Tenderness: There is no abdominal tenderness. There is no guarding or rebound.  Musculoskeletal:        General: No deformity or signs of injury. Normal range of motion.     Cervical back: Neck supple.     Right  lower leg: Edema present.     Left lower leg: Edema present.     Comments: Tender right lateral and posterior chest, no crepitus  Skin:    General: Skin is warm and dry.  Neurological:     General: No focal deficit present.     Mental Status: She is alert.     Sensory: No sensory deficit.     Motor: No weakness.     ED Results / Procedures / Treatments   Labs (all labs ordered are listed, but only abnormal results are displayed) Labs Reviewed  CBC WITH DIFFERENTIAL/PLATELET - Abnormal; Notable for the following components:      Result Value   WBC 12.7 (*)    Hemoglobin 11.5 (*)    Platelets 74 (*)    Neutro Abs 9.8 (*)    Abs Immature Granulocytes 0.23 (*)    All other components within normal limits  BASIC METABOLIC PANEL - Abnormal; Notable for the following components:   Chloride 95 (*)    CO2 37 (*)    Glucose, Bld 139 (*)    Calcium 8.6 (*)    All other components within normal limits  URINALYSIS, ROUTINE W REFLEX MICROSCOPIC - Abnormal; Notable for the following components:   Color, Urine STRAW (*)    All other components within normal limits  CBC - Abnormal; Notable for the following components:   WBC 14.9 (*)    Hemoglobin 11.3 (*)    Platelets 75 (*)    All other components within normal limits  BASIC METABOLIC PANEL - Abnormal; Notable for the following components:   CO2 36 (*)    Glucose, Bld 148 (*)    All other components within normal limits  RESP PANEL BY RT-PCR (FLU A&B, COVID) [id number]    EKG EKG Interpretation  Date/Time:  Wednesday January 02 2021 15:32:42 EDT Ventricular Rate:  93 PR Interval:  164 QRS Duration: 87 QT Interval:  377 QTC Calculation: 469 R Axis:   -65 Text Interpretation: Sinus rhythm Left anterior fascicular block Low voltage, precordial leads RSR' in V1 or V2, right VCD or RVH Consider anterior infarct Baseline wander in lead(s)  V3 V4 V5 V6 No significant change since prior 1/22 Confirmed by Aletta Edouard [phone]) on 01/02/2021  3:41:47 PM   Radiology CT Abdomen Pelvis Wo Contrast  Result Date: 01/02/2021 CLINICAL DATA:  Patient status post slip and fall today. EXAM: CT ABDOMEN AND PELVIS WITHOUT CONTRAST TECHNIQUE: Multidetector CT imaging of the abdomen and pelvis was performed following the standard protocol without IV contrast. COMPARISON:  CT abdomen and pelvis 11/13/2014. CT chest earlier today and 09/26/2020. FINDINGS: Lower chest: Basilar airspace disease and small right basilar pneumothorax or unchanged compared to the patient's chest CT earlier today. Hepatobiliary: Low attenuating lesion in the posterior right hepatic lobe compatible with hemangioma is unchanged since prior CT abdomen and pelvis. No other focal liver lesion. Multiple stones are seen in the gallbladder but there is no evidence of cholecystitis. The biliary tree appears normal. Pancreas: Unremarkable. No pancreatic ductal dilatation or surrounding inflammatory changes. Spleen: Normal in size without focal abnormality. Adrenals/Urinary Tract: Adrenal glands are unremarkable. Kidneys are normal, without renal calculi, focal lesion, or hydronephrosis. Bladder is unremarkable. Stomach/Bowel: Stomach is within normal limits. No evidence of appendicitis. No evidence of bowel wall thickening, distention, or inflammatory changes. Vascular/Lymphatic: Aortic atherosclerosis. No enlarged abdominal or pelvic lymph nodes. The aorta is markedly tortuous with a straight right lateral orientation at the level of L2, unchanged. Reproductive: Status post hysterectomy. No adnexal masses. Other: Large ventral hernia is unchanged. Musculoskeletal: As seen on the prior exam, the patient is status post L1 corpectomy. There is extensive bony destructive change throughout the T12 vertebral body which appears new or worse compared to the patient's most recent chest CT. Marked subsidence of the corpectomy strut into the L2 vertebral body is unchanged. Remote compression fractures are  seen at all remaining imaged thoracic and lumbar levels. IMPRESSION: Status post L1 corpectomy as seen on prior exams. Bony destructive change throughout the T12 vertebral body appears new or at least worse compared to the most recent comparison dated 09/26/2020. This finding could be due to infection, inflammatory change or possibly tumor. The appearance is not suggestive of fracture due to fall. MRI with and without contrast is the best next test for further evaluation. Remote compression fractures at all imaged thoracic and lumbar levels. Large ventral hernia. Gallstones without evidence of cholecystitis. Bibasilar airspace disease and small right basilar pneumothorax as seen on chest CT today. Electronically Signed   By: Inge Rise M.D.   On: 01/02/2021 20:03   CT Head Wo Contrast  Result Date: 01/02/2021 CLINICAL DATA:  70 year old female with fall. EXAM: CT HEAD WITHOUT CONTRAST CT CERVICAL SPINE WITHOUT CONTRAST TECHNIQUE: Multidetector CT imaging of the head and cervical spine was performed following the standard protocol without intravenous contrast. Multiplanar CT image reconstructions of the cervical spine were also generated. COMPARISON:  Head CT dated 05/13/2020. FINDINGS: CT HEAD FINDINGS Brain: The ventricles and sulci appropriate size for patient's age. The gray-white matter discrimination is preserved. There is no acute intracranial hemorrhage. No mass effect or midline shift. No extra-axial fluid collection. Vascular: No hyperdense vessel or unexpected calcification. Skull: Normal. Negative for fracture or focal lesion. Sinuses/Orbits: Mild mucoperiosteal thickening of paranasal sinuses. No air-fluid level. Mastoid air cells are clear. Other: None CT CERVICAL SPINE FINDINGS Alignment: No acute subluxation. Skull base and vertebrae: No acute fracture. Osteopenia. Soft tissues and spinal canal: No prevertebral fluid or swelling. No visible canal hematoma. Disc levels:  No acute findings.  Upper chest: Mild emphysema. Minimal right apical pneumothorax. Please see report for  the chest CT. Other: None IMPRESSION: 1. No acute intracranial pathology. 2. No acute/traumatic cervical spine pathology. 3. Minimal right apical pneumothorax. Please see report for the chest CT. Electronically Signed   By: Anner Crete M.D.   On: 01/02/2021 16:46   CT Chest Wo Contrast  Result Date: 01/02/2021 CLINICAL DATA:  Suspected rib fracture.  Fall. EXAM: CT CHEST WITHOUT CONTRAST TECHNIQUE: Multidetector CT imaging of the chest was performed following the standard protocol without IV contrast. COMPARISON:  09/26/2020 FINDINGS: Cardiovascular: Tortuous aorta. No aneurysm. Scattered coronary artery and aortic calcifications. Heart is borderline in size. Mediastinum/Nodes: No mediastinal, hilar, or axillary adenopathy. Trachea and esophagus are unremarkable. Bilateral thyroid nodules, the largest 12 mm in the right thyroid lobe. Not clinically significant; no follow-up imaging recommended (ref: J Am Coll Radiol. 2015 Feb;12(2): 143-50). Lungs/Pleura: Linear areas of scarring or atelectasis in the mid and lower lungs bilaterally. No effusions. Small right pneumothorax noted in the lower chest. Upper Abdomen: Low-density lesion posteriorly in the liver measures 3.8 cm, similar to prior study. No acute findings. Musculoskeletal: Posterior right 9th and 10th rib fractures noted. No additional acute bony abnormality. Chest wall soft tissues are unremarkable. IMPRESSION: Posterior right 9th and 10th rib fractures. Tiny right basilar pneumothorax. Areas of atelectasis or scarring in the mid and lower lungs. Coronary artery disease. Aortic Atherosclerosis (ICD10-I70.0). Electronically Signed   By: Rolm Baptise M.D.   On: 01/02/2021 16:50   CT Cervical Spine Wo Contrast  Result Date: 01/02/2021 CLINICAL DATA:  70 year old female with fall. EXAM: CT HEAD WITHOUT CONTRAST CT CERVICAL SPINE WITHOUT CONTRAST TECHNIQUE:  Multidetector CT imaging of the head and cervical spine was performed following the standard protocol without intravenous contrast. Multiplanar CT image reconstructions of the cervical spine were also generated. COMPARISON:  Head CT dated 05/13/2020. FINDINGS: CT HEAD FINDINGS Brain: The ventricles and sulci appropriate size for patient's age. The gray-white matter discrimination is preserved. There is no acute intracranial hemorrhage. No mass effect or midline shift. No extra-axial fluid collection. Vascular: No hyperdense vessel or unexpected calcification. Skull: Normal. Negative for fracture or focal lesion. Sinuses/Orbits: Mild mucoperiosteal thickening of paranasal sinuses. No air-fluid level. Mastoid air cells are clear. Other: None CT CERVICAL SPINE FINDINGS Alignment: No acute subluxation. Skull base and vertebrae: No acute fracture. Osteopenia. Soft tissues and spinal canal: No prevertebral fluid or swelling. No visible canal hematoma. Disc levels:  No acute findings. Upper chest: Mild emphysema. Minimal right apical pneumothorax. Please see report for the chest CT. Other: None IMPRESSION: 1. No acute intracranial pathology. 2. No acute/traumatic cervical spine pathology. 3. Minimal right apical pneumothorax. Please see report for the chest CT. Electronically Signed   By: Anner Crete M.D.   On: 01/02/2021 16:46   DG CHEST PORT 1 VIEW  Result Date: 01/03/2021 CLINICAL DATA:  Rib fractures after fall in bathroom yesterday EXAM: PORTABLE CHEST 1 VIEW COMPARISON:  Chest CT 01/02/2021 FINDINGS: Low volume chest with hazy and streaky density at the bases. Cardiopericardial enlargement. No visible effusion or pneumothorax. Osseous structures are better depicted by prior CT. IMPRESSION: Extensive atelectasis that is progressed from yesterday. Electronically Signed   By: Monte Fantasia M.D.   On: 01/03/2021 07:23    Procedures Procedures   Medications Ordered in ED Medications  carvedilol (COREG)  tablet 3.125 mg (has no administration in time range)  loperamide (IMODIUM) capsule 2 mg (has no administration in time range)  vitamin B-12 (CYANOCOBALAMIN) tablet 1,000 mcg (has no administration in time range)  potassium chloride (KLOR-CON) CR tablet 10 mEq (has no administration in time range)  promethazine (PHENERGAN) tablet 12.5 mg (has no administration in time range)  sodium chloride flush (NS) 0.9 % injection 3 mL (3 mLs Intravenous Given 01/03/21 0231)  sodium chloride flush (NS) 0.9 % injection 3 mL (has no administration in time range)  0.9 %  sodium chloride infusion (has no administration in time range)  traMADol (ULTRAM) tablet 50 mg (has no administration in time range)  oxyCODONE (Oxy IR/ROXICODONE) immediate release tablet 5-10 mg (has no administration in time range)  morphine 2 MG/ML injection 1-2 mg (2 mg Intravenous Given 01/03/21 0316)  ondansetron (ZOFRAN-ODT) disintegrating tablet 4 mg (has no administration in time range)    Or  ondansetron (ZOFRAN) injection 4 mg (has no administration in time range)  methocarbamol (ROBAXIN) tablet 500 mg (has no administration in time range)  furosemide (LASIX) tablet 80 mg (has no administration in time range)  acetaminophen (TYLENOL) tablet 650 mg (has no administration in time range)  lidocaine (LIDODERM) 5 % 1 patch (has no administration in time range)  fentaNYL (SUBLIMAZE) injection 50 mcg (50 mcg Intravenous Given 01/02/21 1653)  methocarbamol (ROBAXIN) 500 mg in dextrose 5 % 50 mL IVPB (0 mg Intravenous Stopped 01/02/21 2216)  morphine 2 MG/ML injection 2 mg (2 mg Intravenous Given 01/02/21 2030)    ED Course  I have reviewed the triage vital signs and the nursing notes.  Pertinent labs & imaging results that were available during my care of the patient were reviewed by me and considered in my medical decision making (see chart for details).  Clinical Course as of 01/03/21 0910  Wed Jan 02, 2021  1726 Discussed with Dr.  Barry Dienes trauma who recommends getting CT abdomen and pelvis.  She said the patient will likely need to be admitted but if she is strongly adamant that she wants to go home she would be okay with that. [MB]  1914 Was able to reach patient's daughter Maximino Greenland and updated on results. [MB]    Clinical Course User Index [MB] Hayden Rasmussen, MD   MDM Rules/Calculators/A&P                         This patient complains of right posterior chest pain after slip and fall at home; this involves an extensive number of treatment Options and is a complaint that carries with it a high risk of complications and Morbidity. The differential includes rib fractures, pneumothorax, contusion, hemothorax, respiratory failure  I ordered, reviewed and interpreted labs, which included CBC with elevated white count, hemoglobin low stable from priors, chemistries showing increased CO2 consistent with chronic retention, mildly elevated glucose, COVID testing negative I ordered medication IV pain medication with some improvement in her symptoms I ordered imaging studies which included CT head cervical spine and chest and I independently    visualized and interpreted imaging which showed 2 rib fractures and tiny pneumothorax Additional history obtained from EMS Previous records obtained and reviewed in epic, patient has chronic CHF and peripheral edema I consulted Dr. Barry Dienes trauma and discussed lab and imaging findings  Critical Interventions: None  After the interventions stated above, I reevaluated the patient and found patient to be reasonably comfortable on her home oxygen baseline settings.  Due to her chronic medical conditions along with her acute rib fractures and pneumothorax she will need to be admitted to the hospital for continued management.  Appreciate trauma for  evaluating patient and taking on their service.   Final Clinical Impression(s) / ED Diagnoses Final diagnoses:  Fall, initial encounter   Closed fracture of multiple ribs of right side, initial encounter  Traumatic pneumothorax, initial encounter  Abrasion of chin with infection, initial encounter    Rx / DC Orders ED Discharge Orders    None       Hayden Rasmussen, MD 01/03/21 [phone]

## 2021-01-02 NOTE — ED Notes (Signed)
BACK TO C-T

## 2021-01-03 ENCOUNTER — Observation Stay (HOSPITAL_COMMUNITY): Payer: Medicare PPO

## 2021-01-03 ENCOUNTER — Other Ambulatory Visit: Payer: Self-pay

## 2021-01-03 DIAGNOSIS — S2241XA Multiple fractures of ribs, right side, initial encounter for closed fracture: Secondary | ICD-10-CM | POA: Diagnosis not present

## 2021-01-03 DIAGNOSIS — J9811 Atelectasis: Secondary | ICD-10-CM | POA: Diagnosis not present

## 2021-01-03 DIAGNOSIS — I5032 Chronic diastolic (congestive) heart failure: Secondary | ICD-10-CM | POA: Diagnosis not present

## 2021-01-03 DIAGNOSIS — S270XXA Traumatic pneumothorax, initial encounter: Secondary | ICD-10-CM | POA: Diagnosis not present

## 2021-01-03 DIAGNOSIS — M40204 Unspecified kyphosis, thoracic region: Secondary | ICD-10-CM | POA: Diagnosis not present

## 2021-01-03 DIAGNOSIS — J90 Pleural effusion, not elsewhere classified: Secondary | ICD-10-CM | POA: Diagnosis not present

## 2021-01-03 DIAGNOSIS — M4854XA Collapsed vertebra, not elsewhere classified, thoracic region, initial encounter for fracture: Secondary | ICD-10-CM | POA: Diagnosis not present

## 2021-01-03 DIAGNOSIS — J939 Pneumothorax, unspecified: Secondary | ICD-10-CM

## 2021-01-03 DIAGNOSIS — D1809 Hemangioma of other sites: Secondary | ICD-10-CM | POA: Diagnosis not present

## 2021-01-03 LAB — CBC
HCT: 36.3 % (ref 36.0–46.0)
Hemoglobin: 11.3 g/dL — ABNORMAL LOW (ref 12.0–15.0)
MCH: 28 pg (ref 26.0–34.0)
MCHC: 31.1 g/dL (ref 30.0–36.0)
MCV: 89.9 fL (ref 80.0–100.0)
Platelets: 75 10*3/uL — ABNORMAL LOW (ref 150–400)
RBC: 4.04 MIL/uL (ref 3.87–5.11)
RDW: 14.6 % (ref 11.5–15.5)
WBC: 14.9 10*3/uL — ABNORMAL HIGH (ref 4.0–10.5)
nRBC: 0 % (ref 0.0–0.2)

## 2021-01-03 LAB — BASIC METABOLIC PANEL
Anion gap: 5 (ref 5–15)
BUN: 17 mg/dL (ref 8–23)
CO2: 36 mmol/L — ABNORMAL HIGH (ref 22–32)
Calcium: 9 mg/dL (ref 8.9–10.3)
Chloride: 98 mmol/L (ref 98–111)
Creatinine, Ser: 0.55 mg/dL (ref 0.44–1.00)
GFR, Estimated: >60 mL/min (ref >60)
Glucose, Bld: 148 mg/dL — ABNORMAL HIGH (ref 70–99)
Potassium: 4.4 mmol/L (ref 3.5–5.1)
Sodium: 139 mmol/L (ref 135–145)

## 2021-01-03 MED ORDER — LOPERAMIDE HCL 2 MG PO CAPS: 2.0000 mg | ORAL_CAPSULE | ORAL | Status: DC | PRN

## 2021-01-03 MED ORDER — METHOCARBAMOL 500 MG PO TABS
500.0000 mg | ORAL_TABLET | Freq: Three times a day (TID) | ORAL | Status: DC | PRN
  Administered 2021-01-03 (×2): 500 mg via ORAL
  Filled 2021-01-03 (×2): qty 1

## 2021-01-03 MED ORDER — LIDOCAINE 5 % EX PTCH
1.0000 | MEDICATED_PATCH | CUTANEOUS | Status: DC
  Administered 2021-01-03 – 2021-01-21 (×19): 1 via TRANSDERMAL
  Filled 2021-01-03 (×21): qty 1

## 2021-01-03 MED ORDER — POTASSIUM CHLORIDE CRYS ER 10 MEQ PO TBCR
10.0000 meq | EXTENDED_RELEASE_TABLET | Freq: Every day | ORAL | Status: DC
  Administered 2021-01-03 – 2021-01-04 (×2): 10 meq via ORAL
  Filled 2021-01-03 (×2): qty 1

## 2021-01-03 MED ORDER — OXYCODONE HCL 5 MG PO TABS
5.0000 mg | ORAL_TABLET | Freq: Four times a day (QID) | ORAL | Status: DC | PRN
  Administered 2021-01-03: 5 mg via ORAL
  Filled 2021-01-03: qty 1

## 2021-01-03 MED ORDER — CARVEDILOL 3.125 MG PO TABS
3.1250 mg | ORAL_TABLET | Freq: Two times a day (BID) | ORAL | Status: DC
Start: 2021-01-03 — End: 2021-01-08
  Administered 2021-01-03 – 2021-01-05 (×5): 3.125 mg via ORAL
  Filled 2021-01-03 (×6): qty 1

## 2021-01-03 MED ORDER — "XEROFORM PETROLAT GAUZE 5""X9"" EX MISC": 1.0000 "application " | Freq: Every day | CUTANEOUS | Status: DC

## 2021-01-03 MED ORDER — FUROSEMIDE 80 MG PO TABS
80.0000 mg | ORAL_TABLET | Freq: Every day | ORAL | Status: DC
  Administered 2021-01-03: 80 mg via ORAL
  Filled 2021-01-03: qty 1

## 2021-01-03 MED ORDER — ONDANSETRON HCL 4 MG/2ML IJ SOLN
4.0000 mg | Freq: Four times a day (QID) | INTRAMUSCULAR | Status: DC | PRN
  Administered 2021-01-04: 4 mg via INTRAVENOUS
  Filled 2021-01-03: qty 2

## 2021-01-03 MED ORDER — OXYCODONE HCL 5 MG PO TABS
10.0000 mg | ORAL_TABLET | ORAL | Status: DC | PRN
  Administered 2021-01-03: 10 mg via ORAL
  Filled 2021-01-03: qty 2

## 2021-01-03 MED ORDER — SODIUM CHLORIDE 0.9% FLUSH: 3.0000 mL | INTRAVENOUS | Status: DC | PRN

## 2021-01-03 MED ORDER — FUROSEMIDE 20 MG PO TABS: 80.0000 mg | ORAL_TABLET | ORAL | Status: DC

## 2021-01-03 MED ORDER — SODIUM CHLORIDE 0.9 % IV SOLN: 250.0000 mL | INTRAVENOUS | Status: DC | PRN

## 2021-01-03 MED ORDER — SODIUM CHLORIDE 0.9% FLUSH
3.0000 mL | Freq: Two times a day (BID) | INTRAVENOUS | Status: DC
  Administered 2021-01-03 – 2021-01-21 (×36): 3 mL via INTRAVENOUS

## 2021-01-03 MED ORDER — MORPHINE SULFATE (PF) 2 MG/ML IV SOLN
1.0000 mg | INTRAVENOUS | Status: DC | PRN
  Administered 2021-01-03 (×3): 2 mg via INTRAVENOUS
  Filled 2021-01-03 (×3): qty 1

## 2021-01-03 MED ORDER — OXYCODONE HCL 5 MG PO TABS
5.0000 mg | ORAL_TABLET | ORAL | Status: DC | PRN
Start: 2021-01-03 — End: 2021-01-03

## 2021-01-03 MED ORDER — "AQUACEL AG ADVANTAGE 4""X5"" EX PADS": 1.0000 "application " | MEDICATED_PAD | Freq: Every day | CUTANEOUS | Status: DC

## 2021-01-03 MED ORDER — VITAMIN B-12 1000 MCG PO TABS
1000.0000 ug | ORAL_TABLET | Freq: Every day | ORAL | Status: DC
  Administered 2021-01-03 – 2021-01-05 (×3): 1000 ug via ORAL
  Filled 2021-01-03 (×3): qty 1

## 2021-01-03 MED ORDER — TRAMADOL HCL 50 MG PO TABS: 50.0000 mg | ORAL_TABLET | Freq: Four times a day (QID) | ORAL | Status: DC | PRN

## 2021-01-03 MED ORDER — ACETAMINOPHEN 325 MG PO TABS
650.0000 mg | ORAL_TABLET | Freq: Four times a day (QID) | ORAL | Status: DC
  Administered 2021-01-03 – 2021-01-04 (×5): 650 mg via ORAL
  Filled 2021-01-03 (×6): qty 2

## 2021-01-03 MED ORDER — PROMETHAZINE HCL 12.5 MG PO TABS
12.5000 mg | ORAL_TABLET | Freq: Four times a day (QID) | ORAL | Status: DC | PRN
  Filled 2021-01-03: qty 1

## 2021-01-03 MED ORDER — ONDANSETRON 4 MG PO TBDP: 4.0000 mg | ORAL_TABLET | Freq: Four times a day (QID) | ORAL | Status: DC | PRN

## 2021-01-03 MED ORDER — HYDROMORPHONE HCL 1 MG/ML IJ SOLN
1.0000 mg | INTRAMUSCULAR | Status: DC | PRN
  Administered 2021-01-03: 1 mg via INTRAVENOUS
  Filled 2021-01-03: qty 1

## 2021-01-03 NOTE — Plan of Care (Signed)
Patient arrived on unit from ED alert, oriented and without distress. Patient settled into room, admission assessment completed. Will continue to monitor according to orders and plan of care.

## 2021-01-03 NOTE — Significant Event (Signed)
Rapid Response Event Note   Reason for Call :  SpO2-low 80s on 3L [place]. Pt increased to [facility] with SpO2 increasing to 92%.   Initial Focused Assessment:  Pt sitting up in bed with eyes open, alert and oriented. Pt c/o SOB which, per pt, has gotten better since FiO2 increased from 3L [place] to 6L [place]. Pt c/o R chest/rib pain 10/10. Pt is mildly labored/belly breathing and she is tachypneic. Lungs sound diminished t/o. Skin warm and dry. Pt has IS at bedside but says she has not used it very much.   T-97.9, HR-99, BP-122/73, RR-28, SpO2-92% on 6L [place].   During assessment, pt SpO2 dropped to 77%, pt placed on HFNC and titrated up to 15L with SpO2 increasing to 94%.    Interventions:  PCXR-progressive atelectasis compared to earlier film, small R pleural effusion 1mg  dilaudid IV(already ordered prn) HFNC Plan of Care:  Pt very sleepy after dilaudid, but arousable(MD to decrease dilaudid dose). SpO2-94% on 15L HFNC, however SpO2 does fluctuate at times. . Will transfer pt to PCU for closer monitoring. ABG if pt continues to worsen. Bipap if needed.   Event Summary:   MD Notified: Dr. Birdena Jubilee notified and came to bedside Call [place], Sakeena Teall Anderson, RN

## 2021-01-03 NOTE — Progress Notes (Incomplete)
Paged by rapid response nurse pending that nurse found patient to be hypoxic with an SPO2 in the low 80s on 3 L nasal cannula.  Patient was increased from 3 to 6 L with improvement to 92% O2 sat.  RN and rapid response nurse noted that patient was complaining shortness of breath which improved after increasing oxygen supplementation.  Patient was also complaining of right chest/rib pain 10 out of 10.  Patient was given as needed Dilaudid during this time as thought was patient's rib pain was contributing to her hypoxia.  Patient continued to be hypoxic, so she was increased to 15 L high flow nasal cannula.  Upon our bedside evaluation, patient was saturating 94% on 13 L high flow nasal cannula.  She was sleeping, with open mouth breathing with some accessory muscle use.  Patient was somewhat difficult to arouse, suspect that it is secondary to the Dilaudid she was given.  Nursing note patient was more arousable prior to pain medication.  Once aroused, patient was able to answer questions.  She was alert and oriented x3.  Endorsed improvement of right-sided rib pain with deep breath and denied being short of breath.  Patient did have fluctuations in O2 saturations on 13 L, these did improve when patient was aroused and woken and she was no longer breathing through her mouth.  I do believe her increasing oxygen supplementation demand is multifactorial; secondary to rib pain, history of obesity hypoventilation syndrome, worsening atelectasis.  I do have a low suspicion for aspiration pneumonia and that pain medications were contributing to her increasing oxygen demand.    DC as needed IV/p.o. opioid medications and continue with as needed Tylenol and lidocaine patch.  She does awaken and is having worsening pain we will start with low-dose opioid.

## 2021-01-03 NOTE — Progress Notes (Signed)
Progress Note     Subjective: CC: she is having right sided pain from her fall that is exacerbated with movement. She denies SHOB on home O2 therapy. Tolerating oral intake without nausea or emesis  Objective: Vital signs in last 24 hours: Temp:  [98.2 F (36.8 C)-98.9 F (37.2 C)] 98.7 F (37.1 C) (04/21 1024) Pulse Rate:  [88-103] 98 (04/21 1024) Resp:  [12-26] 19 (04/21 1024) BP: (113-135)/(59-78) 113/60 (04/21 1024) SpO2:  [88 %-97 %] 94 % (04/21 1024) Weight:  [89.6 kg] 89.6 kg (04/20 1550)    Intake/Output from previous day: 04/20 0701 - 04/21 0700 In: 93 [P.O.:60] Out: 100 [Urine:100] Intake/Output this shift: Total I/O In: 680 [P.O.:680] Out: -   PE: General: pleasant, WD, female who is laying in bed in NAD HEENT: head is normocephalic, atraumatic.  Sclera are noninjected.  PERRL.  Ears and nose without any masses or lesions.  Mouth is pink and moist Heart: regular, rate, and rhythm. Palpable radial and pedal pulses bilaterally Lungs: CTAB.  Respiratory effort nonlabored Abd: soft, NT, ND, +BS, no masses, hernias, or organomegaly MS: bilaterally lower extremities in compression wraps Skin: warm and dry with no masses, lesions, or rashes Neuro: Cranial nerves 2-12 grossly intact, sensation is normal throughout Psych: A&Ox3 with an appropriate affect.    Lab Results:  Recent Labs    01/02/21 1548 01/03/21 0151  WBC 12.7* 14.9*  HGB 11.5* 11.3*  HCT 36.9 36.3  PLT 74* 75*   BMET Recent Labs    01/02/21 1548 01/03/21 0151  NA 141 139  K 4.0 4.4  CL 95* 98  CO2 37* 36*  GLUCOSE 139* 148*  BUN 20 17  CREATININE 0.61 0.55  CALCIUM 8.6* 9.0   PT/INR No results for input(s): LABPROT, INR in the last 72 hours. CMP     Component Value Date/Time   NA 139 01/03/2021 0151   NA 140 10/20/2018 1415   NA 140 11/16/2013 1238   K 4.4 01/03/2021 0151   K 4.1 11/16/2013 1238   CL 98 01/03/2021 0151   CO2 36 (H) 01/03/2021 0151   CO2 24 11/16/2013  1238   GLUCOSE 148 (H) 01/03/2021 0151   GLUCOSE 114 11/16/2013 1238   BUN 17 01/03/2021 0151   BUN 20 10/20/2018 1415   BUN 12.1 11/16/2013 1238   CREATININE 0.55 01/03/2021 0151   CREATININE 0.7 11/16/2013 1238   CALCIUM 9.0 01/03/2021 0151   CALCIUM 9.7 11/16/2013 1238   PROT 5.7 (L) 09/27/2020 0404   PROT 6.6 09/29/2013 1500   ALBUMIN 2.5 (L) 09/27/2020 0404   ALBUMIN 4.2 09/29/2013 1500   AST 17 09/27/2020 0404   AST 15 09/29/2013 1500   ALT 19 09/27/2020 0404   ALT 43 09/29/2013 1500   ALKPHOS 91 09/27/2020 0404   ALKPHOS 71 09/29/2013 1500   BILITOT 1.0 09/27/2020 0404   BILITOT 1.15 09/29/2013 1500   GFRNONAA >60 01/03/2021 0151   GFRAA >60 05/17/2020 0631   Lipase     Component Value Date/Time   LIPASE 28 11/12/2014 2316       Studies/Results: CT Abdomen Pelvis Wo Contrast  Result Date: 01/02/2021 CLINICAL DATA:  Patient status post slip and fall today. EXAM: CT ABDOMEN AND PELVIS WITHOUT CONTRAST TECHNIQUE: Multidetector CT imaging of the abdomen and pelvis was performed following the standard protocol without IV contrast. COMPARISON:  CT abdomen and pelvis 11/13/2014. CT chest earlier today and 09/26/2020. FINDINGS: Lower chest: Basilar airspace disease and small right  basilar pneumothorax or unchanged compared to the patient's chest CT earlier today. Hepatobiliary: Low attenuating lesion in the posterior right hepatic lobe compatible with hemangioma is unchanged since prior CT abdomen and pelvis. No other focal liver lesion. Multiple stones are seen in the gallbladder but there is no evidence of cholecystitis. The biliary tree appears normal. Pancreas: Unremarkable. No pancreatic ductal dilatation or surrounding inflammatory changes. Spleen: Normal in size without focal abnormality. Adrenals/Urinary Tract: Adrenal glands are unremarkable. Kidneys are normal, without renal calculi, focal lesion, or hydronephrosis. Bladder is unremarkable. Stomach/Bowel: Stomach is  within normal limits. No evidence of appendicitis. No evidence of bowel wall thickening, distention, or inflammatory changes. Vascular/Lymphatic: Aortic atherosclerosis. No enlarged abdominal or pelvic lymph nodes. The aorta is markedly tortuous with a straight right lateral orientation at the level of L2, unchanged. Reproductive: Status post hysterectomy. No adnexal masses. Other: Large ventral hernia is unchanged. Musculoskeletal: As seen on the prior exam, the patient is status post L1 corpectomy. There is extensive bony destructive change throughout the T12 vertebral body which appears new or worse compared to the patient's most recent chest CT. Marked subsidence of the corpectomy strut into the L2 vertebral body is unchanged. Remote compression fractures are seen at all remaining imaged thoracic and lumbar levels. IMPRESSION: Status post L1 corpectomy as seen on prior exams. Bony destructive change throughout the T12 vertebral body appears new or at least worse compared to the most recent comparison dated 09/26/2020. This finding could be due to infection, inflammatory change or possibly tumor. The appearance is not suggestive of fracture due to fall. MRI with and without contrast is the best next test for further evaluation. Remote compression fractures at all imaged thoracic and lumbar levels. Large ventral hernia. Gallstones without evidence of cholecystitis. Bibasilar airspace disease and small right basilar pneumothorax as seen on chest CT today. Electronically Signed   By: Inge Rise M.D.   On: 01/02/2021 20:03   CT Head Wo Contrast  Result Date: 01/02/2021 CLINICAL DATA:  70 year old female with fall. EXAM: CT HEAD WITHOUT CONTRAST CT CERVICAL SPINE WITHOUT CONTRAST TECHNIQUE: Multidetector CT imaging of the head and cervical spine was performed following the standard protocol without intravenous contrast. Multiplanar CT image reconstructions of the cervical spine were also generated.  COMPARISON:  Head CT dated 05/13/2020. FINDINGS: CT HEAD FINDINGS Brain: The ventricles and sulci appropriate size for patient's age. The gray-white matter discrimination is preserved. There is no acute intracranial hemorrhage. No mass effect or midline shift. No extra-axial fluid collection. Vascular: No hyperdense vessel or unexpected calcification. Skull: Normal. Negative for fracture or focal lesion. Sinuses/Orbits: Mild mucoperiosteal thickening of paranasal sinuses. No air-fluid level. Mastoid air cells are clear. Other: None CT CERVICAL SPINE FINDINGS Alignment: No acute subluxation. Skull base and vertebrae: No acute fracture. Osteopenia. Soft tissues and spinal canal: No prevertebral fluid or swelling. No visible canal hematoma. Disc levels:  No acute findings. Upper chest: Mild emphysema. Minimal right apical pneumothorax. Please see report for the chest CT. Other: None IMPRESSION: 1. No acute intracranial pathology. 2. No acute/traumatic cervical spine pathology. 3. Minimal right apical pneumothorax. Please see report for the chest CT. Electronically Signed   By: Anner Crete M.D.   On: 01/02/2021 16:46   CT Chest Wo Contrast  Result Date: 01/02/2021 CLINICAL DATA:  Suspected rib fracture.  Fall. EXAM: CT CHEST WITHOUT CONTRAST TECHNIQUE: Multidetector CT imaging of the chest was performed following the standard protocol without IV contrast. COMPARISON:  09/26/2020 FINDINGS: Cardiovascular: Tortuous aorta.  No aneurysm. Scattered coronary artery and aortic calcifications. Heart is borderline in size. Mediastinum/Nodes: No mediastinal, hilar, or axillary adenopathy. Trachea and esophagus are unremarkable. Bilateral thyroid nodules, the largest 12 mm in the right thyroid lobe. Not clinically significant; no follow-up imaging recommended (ref: J Am Coll Radiol. 2015 Feb;12(2): 143-50). Lungs/Pleura: Linear areas of scarring or atelectasis in the mid and lower lungs bilaterally. No effusions. Small  right pneumothorax noted in the lower chest. Upper Abdomen: Low-density lesion posteriorly in the liver measures 3.8 cm, similar to prior study. No acute findings. Musculoskeletal: Posterior right 9th and 10th rib fractures noted. No additional acute bony abnormality. Chest wall soft tissues are unremarkable. IMPRESSION: Posterior right 9th and 10th rib fractures. Tiny right basilar pneumothorax. Areas of atelectasis or scarring in the mid and lower lungs. Coronary artery disease. Aortic Atherosclerosis (ICD10-I70.0). Electronically Signed   By: Rolm Baptise M.D.   On: 01/02/2021 16:50   CT Cervical Spine Wo Contrast  Result Date: 01/02/2021 CLINICAL DATA:  70 year old female with fall. EXAM: CT HEAD WITHOUT CONTRAST CT CERVICAL SPINE WITHOUT CONTRAST TECHNIQUE: Multidetector CT imaging of the head and cervical spine was performed following the standard protocol without intravenous contrast. Multiplanar CT image reconstructions of the cervical spine were also generated. COMPARISON:  Head CT dated 05/13/2020. FINDINGS: CT HEAD FINDINGS Brain: The ventricles and sulci appropriate size for patient's age. The gray-white matter discrimination is preserved. There is no acute intracranial hemorrhage. No mass effect or midline shift. No extra-axial fluid collection. Vascular: No hyperdense vessel or unexpected calcification. Skull: Normal. Negative for fracture or focal lesion. Sinuses/Orbits: Mild mucoperiosteal thickening of paranasal sinuses. No air-fluid level. Mastoid air cells are clear. Other: None CT CERVICAL SPINE FINDINGS Alignment: No acute subluxation. Skull base and vertebrae: No acute fracture. Osteopenia. Soft tissues and spinal canal: No prevertebral fluid or swelling. No visible canal hematoma. Disc levels:  No acute findings. Upper chest: Mild emphysema. Minimal right apical pneumothorax. Please see report for the chest CT. Other: None IMPRESSION: 1. No acute intracranial pathology. 2. No  acute/traumatic cervical spine pathology. 3. Minimal right apical pneumothorax. Please see report for the chest CT. Electronically Signed   By: Anner Crete M.D.   On: 01/02/2021 16:46   DG CHEST PORT 1 VIEW  Result Date: 01/03/2021 CLINICAL DATA:  Rib fractures after fall in bathroom yesterday EXAM: PORTABLE CHEST 1 VIEW COMPARISON:  Chest CT 01/02/2021 FINDINGS: Low volume chest with hazy and streaky density at the bases. Cardiopericardial enlargement. No visible effusion or pneumothorax. Osseous structures are better depicted by prior CT. IMPRESSION: Extensive atelectasis that is progressed from yesterday. Electronically Signed   By: Monte Fantasia M.D.   On: 01/03/2021 07:23    Anti-infectives: Anti-infectives (From admission, onward)   None       Assessment/Plan Fall  Chronic CHF - per medicine Chronic respiratory failure with hypoxia and hypercapnia - continue home O2 therapy Right 9-10 posterior rib fractures - pain control.  Trace right PTX - CXR 4/21 without PTX. Worsening atelectasis. Pulmonary toilet. Incentive spirometry hourly while awake. Recommend repeat CXR tomorrow am History of COVID pneumonia 06/2020 ITP GERD BLE wounds from venous stasis dermatitis - continue bilateral lower extremity wraps Old thoracic and lumbar compression fractures - concern for inflammation vs infection vs malignancy of T12- per medicine  FEN: cardiac diet VTE: per medicine  Dispostion: PT/OT consult. Social work consult placed. Appreciate medicine team assistance   LOS: 0 days    Winferd Humphrey, [facility] 01/03/2021, 11:24 AM Please see Amion for pager number during day hours 7:00am-4:30pm

## 2021-01-03 NOTE — Evaluation (Signed)
Physical Therapy Evaluation Patient Details Name: [name] MRN: [id number] DOB: 11-07-1950 Today's Date: 01/03/2021   History of Present Illness  71 y.o. female admitted on 01/02/21 under observation status after falling at home in the bathroom backwards against the bathtub striking her right posterior chest, no LOC. Workup revealed R rib fxs and trace PTX.  Pt with significant PMH of tachycardia, HTN, vertebral compression fx, depression, lower leg wounds, CKD, back pain, and anterior lateral lumbar fusion.  She is on O2 at home 3L at baseline.  Clinical Impression  Extremely limited initial assessment as pt refusing to actively mobilize (passively repositioned in bed for more upright position).  Educated pt on the importance of early mobility despite pain as she will get more debilitated the longer she waits to move and pt was pre medicated prior to PT arrival. She was able to practice incentive spirometer with me, but is only pulling 250 mL at best.  I encourage IS 10xs/hour as she is likely at very high risk of PNA with the rib fxs and being on home O2 3L PTA.  Pt would be appropriate for SNF placement at discharge, but is adamantly refusing.  She may need ambulance transport home if she continues to be unable to move or stand.     Follow Up Recommendations Home health PT;Supervision for mobility/OOB (max home health services, PT, OT, aide (was active with RN), may need ambulance transport home.)    Equipment Recommendations  None recommended by PT (appropriate for SNF, but pt adamately refusing that option)    Recommendations for Other Services       Precautions / Restrictions Precautions Precautions: Fall Required Braces or Orthoses: Other Brace Other Brace: on 3 L O2 [place] at home      Mobility  Bed Mobility               General bed mobility comments: pt refused even bed mobility reporting her pain is too much right now.  PT repositioned her with HOB elevated for better  lung toileting.    Transfers                 General transfer comment: Pt refused secondary to pain  Ambulation/Gait                Stairs            Wheelchair Mobility    Modified Rankin (Stroke Patients Only)       Balance                                             Pertinent Vitals/Pain Pain Assessment: Faces Faces Pain Scale: Hurts worst Pain Location: right posterior ribs Pain Descriptors / Indicators: Sharp;Stabbing;Grimacing;Guarding Pain Intervention(s): Limited activity within patient's tolerance;Monitored during session;Premedicated before session;Repositioned    Home Living Family/patient expects to be discharged to:: Private residence Living Arrangements: Children (daughter and son in law live with HER (she gets mad if you say that she lives with them).) Available Help at Discharge: Family;Available 24 hours/day (daughter works from home) Type of Home: House Home Access: [place]: One [place]: Kasandra Knudsen - single point;Bedside commode;Other (comment);Walker - 2 wheels      Prior Function Level of Independence: Needs assistance   Gait / Transfers Assistance Needed: uses a walker to ambulate at home,  sleeps in a recliner (no longer has a lift chair)  ADL's / Homemaking Assistance Needed: daughter does housework and cooking.  Pt has an Therapist, sports that comes in and wraps her legs (for chornic wounds) no other aide or home services.        Hand Dominance   Dominant Hand: Right    Extremity/Trunk Assessment   Upper Extremity Assessment Upper Extremity Assessment: Defer to OT evaluation    Lower Extremity Assessment Lower Extremity Assessment: Generalized weakness (chronic bil LE edema and wounds)    Cervical / Trunk Assessment Cervical / Trunk Assessment: Other exceptions Cervical / Trunk Exceptions: h/o lumbar fusion and chronic back pain  Communication   Communication: No  difficulties  Cognition Arousal/Alertness: Awake/alert Behavior During Therapy: WFL for tasks assessed/performed Overall Cognitive Status: Within Functional Limits for tasks assessed                                        General Comments General comments (skin integrity, edema, etc.): Educated on the importance of early mobility to avoid deconditioning and weakness while in the hospital.  Pt reported she understood, but "I cannot do it today" re: moving EOB or OOB to chair.  I elevated her HOB so that she was more upright and we reviewed the IS.    Exercises Other Exercises Other Exercises: IS x 10 reps max inspired volume was 292mL encouraged 10 xs/hour.   Assessment/Plan    PT Assessment Patient needs continued PT services  PT Problem List Decreased strength;Decreased range of motion;Decreased activity tolerance;Decreased balance;Decreased mobility;Decreased knowledge of use of DME;Cardiopulmonary status limiting activity;Pain;Decreased skin integrity;Obesity       PT Treatment Interventions DME instruction;Gait training;Functional mobility training;Therapeutic activities;Therapeutic exercise;Balance training;Patient/family education    PT Goals (Current goals can be found in the Care Plan section)  Acute Rehab PT Goals Patient Stated Goal: to go home PT Goal Formulation: With patient Time For Goal Achievement: 01/17/21 Potential to Achieve Goals: Good    Frequency Min 3X/week   Barriers to discharge        Co-evaluation               AM-PAC PT "6 Clicks" Mobility  Outcome Measure Help needed turning from your back to your side while in a flat bed without using bedrails?: Total Help needed moving from lying on your back to sitting on the side of a flat bed without using bedrails?: Total Help needed moving to and from a bed to a chair (including a wheelchair)?: Total Help needed standing up from a chair using your arms (e.g., wheelchair or bedside  chair)?: Total Help needed to walk in hospital room?: Total Help needed climbing 3-5 steps with a railing? : Total 6 Click Score: 6    End of Session Equipment Utilized During Treatment: Oxygen Activity Tolerance: Patient limited by pain Patient left: in bed;with call bell/phone within reach;with bed alarm set   PT Visit Diagnosis: Muscle weakness (generalized) (M62.81);Difficulty in walking, not elsewhere classified (R26.2);Pain Pain - Right/Left: Right Pain - part of body:  (posterior ribs)    Time: 3810-1751 PT Time Calculation (min) (ACUTE ONLY): 25 min   Charges:   PT Evaluation $PT Eval Moderate Complexity: 1 Mod PT Treatments $Self Care/Home Management: 8-22       Verdene Lennert, PT, DPT  Acute Rehabilitation [phone] pager #[phone] office     01/03/2021, 12:18  PM   

## 2021-01-03 NOTE — Evaluation (Signed)
Occupational Therapy Evaluation Patient Details Name: [name] MRN: [id number] DOB: 07/23/1951 Today's Date: 01/03/2021    History of Present Illness 70 y.o. female admitted on 01/02/21 under observation status after falling at home in the bathroom backwards against the bathtub striking her right posterior chest, no LOC. Workup revealed R rib fxs and trace PTX.  Pt with significant PMH of tachycardia, HTN, vertebral compression fx, depression, lower leg wounds, CKD, back pain, and anterior lateral lumbar fusion.  She is on O2 at home 3L at baseline.   Clinical Impression   Pt admitted to the ED for concerns listed above. PTA pt reported that she was using a RW and ambulating in her home. Pt had a nurse come to her home every other week to change the dressings on BLE and an aide come once every 2 weeks for a bath. Pt lives with her daughter and son in [place], who only help with meals and cleaning. Pt reports that she has not left the home in a very long time due to not being able to get into a car. At the time of the evaluation, pt was very limited by pain, requiring max assist for bed mobility and refusing OOB. Pt presents as very debilitated, however she is refusing SNF due to a bad past experience, only wanting HH. Pt will benefit from continued OT. Acute OT will continuing following.     Follow Up Recommendations  SNF;Supervision/Assistance - 24 hour (Pt needs SNF however at this time is refusing and requesting [facility].)    Equipment Recommendations  Tub/shower bench    Recommendations for Other Services       Precautions / Restrictions Precautions Precautions: Fall Required Braces or Orthoses: Other Brace Other Brace: on 3 L O2 [place] at home Restrictions Weight Bearing Restrictions: No      Mobility Bed Mobility Overal bed mobility: Needs Assistance Bed Mobility: Rolling;Sidelying to Sit;Sit to Sidelying Rolling: Max assist Sidelying to sit: Max assist;HOB elevated     Sit to  sidelying: Min assist;HOB elevated General bed mobility comments: Pt performed limited bed mobility due to pain, sitting EOB pt immediately started trying to lay back down stating she wasn't doing anymore.    Transfers Overall transfer level: Needs assistance               General transfer comment: Pt refused secondary to pain    Balance Overall balance assessment: Needs assistance Sitting-balance support: Feet supported;Single extremity supported Sitting balance-Leahy Scale: Poor Sitting balance - Comments: Pt unable to maintain midline once seated EOB Postural control: Left lateral lean     Standing balance comment: Pt refused to stand this session.                           ADL either performed or assessed with clinical judgement   ADL Overall ADL's : Needs assistance/impaired Eating/Feeding: Set up;Bed level Eating/Feeding Details (indicate cue type and reason): Pt needs all containers and lids open and food placed in front of her. Grooming: Dance movement psychotherapist;Wash/dry hands;Set up;Bed level   Upper Body Bathing: Moderate assistance;Bed level Upper Body Bathing Details (indicate cue type and reason): Pt needs assistance getting her back and under her armpits due to pain Lower Body Bathing: Total assistance;Bed level   Upper Body Dressing : Minimal assistance;Bed level Upper Body Dressing Details (indicate cue type and reason): Pt needs assistance due to pain Lower Body Dressing: Total assistance;Bed level Lower Body Dressing Details (  indicate cue type and reason): Pt unable to reach down to assist with any dressing at this time Toilet Transfer: Total assistance Toilet Transfer Details (indicate cue type and reason): unable to transfer at this time, deffered due to pain/refusal           General ADL Comments: Pt refused OOB at this time, stating that she is not ready and the pain is to much.     Vision Baseline Vision/History: Wears  glasses;Retinopathy Wears Glasses: Reading only Patient Visual Report: No change from baseline Vision Assessment?: No apparent visual deficits     Perception Perception Perception Tested?: No   Praxis Praxis Praxis tested?: Not tested    Pertinent Vitals/Pain Pain Assessment: 0-10 Pain Score: 10-Worst pain ever Faces Pain Scale: Hurts worst Pain Location: BLE and R posterior ribs Pain Descriptors / Indicators: Sharp;Stabbing;Grimacing;Guarding Pain Intervention(s): Limited activity within patient's tolerance;Monitored during session;Repositioned;Patient requesting pain meds-RN notified     Hand Dominance Right   Extremity/Trunk Assessment Upper Extremity Assessment Upper Extremity Assessment: Generalized weakness   Lower Extremity Assessment Lower Extremity Assessment: Defer to PT evaluation   Cervical / Trunk Assessment Cervical / Trunk Assessment: Other exceptions Cervical / Trunk Exceptions: h/o lumbar fusion and chronic back pain   Communication Communication Communication: No difficulties   Cognition Arousal/Alertness: Awake/alert Behavior During Therapy: WFL for tasks assessed/performed Overall Cognitive Status: Within Functional Limits for tasks assessed                                     General Comments  educated on the importance of mobility in order to maintain and improve strength.    Exercises Exercises: Other exercises Other Exercises Other Exercises: IS x 10 reps max inspired volume was 287mL encouraged 10 xs/hour.   Shoulder Instructions      Home Living Family/patient expects to be discharged to:: Private residence Living Arrangements: Children Available Help at Discharge: Family;Available 24 hours/day Type of Home: House Home Access: Ramped entrance     Home Layout: One level     Bathroom Shower/Tub: Teacher, early years/pre: Handicapped height (Uses BSC) Bathroom Accessibility: Yes How Accessible: Accessible  via walker Home Equipment: Gobles - 2 wheels;Cane - single point;Wheelchair - manual;Bedside commode          Prior Functioning/Environment Level of Independence: Needs assistance  Gait / Transfers Assistance Needed: uses a walker to ambulate at home, sleeps in a recliner (no longer has a lift chair) ADL's / Homemaking Assistance Needed: daughter does housework and cooking.  Pt has an Therapist, sports that comes in and wraps her legs (for chornic wounds) every other week, and an aide that comes to bathe and wipe her every other week.            OT Problem List: Decreased strength;Decreased range of motion;Decreased activity tolerance;Impaired balance (sitting and/or standing);Decreased coordination;Decreased cognition;Decreased safety awareness;Decreased knowledge of use of DME or AE;Cardiopulmonary status limiting activity;Pain      OT Treatment/Interventions: Self-care/ADL training;Therapeutic exercise;Energy conservation;DME and/or AE instruction;Therapeutic activities;Cognitive remediation/compensation;Patient/family education;Balance training    OT Goals(Current goals can be found in the care plan section) Acute Rehab OT Goals Patient Stated Goal: to go home OT Goal Formulation: With patient Time For Goal Achievement: 01/17/21 Potential to Achieve Goals: Good ADL Goals Pt Will Perform Grooming: with modified independence;sitting Pt Will Perform Upper Body Bathing: with set-up;sitting Pt Will Perform Lower Body Bathing: with min assist;sitting/lateral leans;sit  to/from stand Pt Will Transfer to Toilet: with min assist;stand pivot transfer;bedside commode  OT Frequency: Min 2X/week   Barriers to D/C:            Co-evaluation              AM-PAC OT "6 Clicks" Daily Activity     Outcome Measure Help from another person eating meals?: A Little Help from another person taking care of personal grooming?: A Little Help from another person toileting, which includes using toliet,  bedpan, or urinal?: Total Help from another person bathing (including washing, rinsing, drying)?: A Lot Help from another person to put on and taking off regular upper body clothing?: A Little Help from another person to put on and taking off regular lower body clothing?: Total 6 Click Score: 13   End of Session Equipment Utilized During Treatment: Oxygen Nurse Communication: Mobility status;Patient requests pain meds  Activity Tolerance: Patient limited by pain Patient left: in bed;with call bell/phone within reach  OT Visit Diagnosis: Unsteadiness on feet (R26.81);Other abnormalities of gait and mobility (R26.89);Muscle weakness (generalized) (M62.81)                Time: 3888-2800 OT Time Calculation (min): 24 min Charges:  OT General Charges $OT Visit: 1 Visit OT Evaluation $OT Eval Moderate Complexity: 1 Mod OT Treatments $Self Care/Home Management : 8-22 mins   Chaos Carlile H., OTR/L Acute Rehabilitation  Ameer Sanden Elane Yolanda Bonine 01/03/2021, 12:52 PM

## 2021-01-03 NOTE — Progress Notes (Signed)
Pt arrived to the floor via the bed with the RN, RRRN and the tech. Pt is sleeping but arousable. CHG bath applied, cardiac monitoring initiated, bed weight obtained. We'll continue to monitor

## 2021-01-03 NOTE — Progress Notes (Addendum)
Paged by rapid response nurse pending that nurse found patient to be hypoxic with an SPO2 in the low 80s on 3 L nasal cannula.  Patient was increased from 3 to 6 L with improvement to 92% O2 sat.  RN and rapid response nurse noted that patient was complaining shortness of breath which improved after increasing oxygen supplementation.  Patient was also complaining of right chest/rib pain 10 out of 10.  Patient was given as needed Dilaudid during this time as thought was patient's rib pain was contributing to her hypoxia.  Patient continued to be hypoxic, so she was increased to 15 L high flow nasal cannula.  Upon our bedside evaluation, patient was saturating 94% on 13 L high flow nasal cannula.  She was sleeping, with open mouth breathing with some accessory muscle use.  Patient was somewhat difficult to arouse, suspect that it is secondary to the Dilaudid she was given.  Nursing note patient was more arousable prior to pain medication.  Once aroused, patient was able to answer questions.  She was alert and oriented x3.  Endorsed improvement of right-sided rib pain with deep breath and denied being short of breath.  Patient did have fluctuations in O2 saturations on 13 L, these did improve when patient was aroused and woken and she was no longer breathing through her mouth.  I do believe her increasing oxygen supplementation demand is multifactorial; secondary to rib pain, history of obesity hypoventilation syndrome, worsening atelectasis.  Chest x-ray was repeated and showed worsening atelectasis but without any evidence of pneumonia. Low suspicion for aspiration pneumonia and that pain medications were contributing to her increasing oxygen demand.  Does have worsening leukocytosis, added procalcitonin as well as CBC with differential.  Reactive versus new infectious etiology.  Patient without systemic symptoms of fever or chills.  Upon chart review it appears as though during prior hospitalizations patient  has been put on CPAP due to her obesity hypoventilation syndrome.  And recent IMTS clinic notes patient has been uncertain if she has a CPAP at home.  I do suspect that this is having a significant contributing to her hypoxia, we will restart CPAP this evening.    My senior resident Sublimity as needed IV/p.o. opioid medications and her PRN Tylenol and lidocaine patch were continued.  I do believe patient will awaken and be in significant pain, requested nursing staff to page IMTS and we will slowly restart pain medications.

## 2021-01-03 NOTE — ED Notes (Signed)
Report given to melanie on 6n

## 2021-01-03 NOTE — Consult Note (Signed)
Date: 01/03/2021               Patient Name:  [name] MRN: [id number]  DOB: 1951-08-02 Age / Sex: 70 y.o., female   PCP: [name], MD         Requesting Physician: Dr. Criss Rosales, Trauma, MD    Consulting Reason:  Chronic medical conditions, multiple comorbidities     Chief Complaint: Fall  History of Present Illness:   70 year old female with PMHx of chronic hypoxic/hypercarbic respiratory failure on 3-4 L oxygen [place] at home, morbid obesity, HFpEF, chronic ITP, HTN, history of COVID-pneumonia infection (October 2021) L1 fracture and chronic back pain,  chronic lower leg wounds, who presented after a mechanical fall in the bathroom at home. She lives with her daughter. She uses a bedside commode and when she slipped on the wet floor (she has felt some water when was trying to rinse out the commode), she fell backward and hit her lower chest on the bathtub. She did not loose her consciousness. No trauma to head due to fall. No nausea or vomiting. She states that the cervical collar made an abrasion on her chin and this is not due to the fall. She mentions that she had an appointment w PCP today that could not make it due to being in hospital and asks me if her PCP can come visit her here in hospitals. I explained that our inpatient team can help with her medications need. She states that she needs medications refill before she leaves the hospital.  Home medications: Carvedilol 3.125 mg daily Lasix 80 mg daily Potassium chloride 10 mEq daily Loperamide 2 mg twice daily   Social Hx: She lives with daughter. Former smoker.  With a 15-pack-year smoking history.  She quit smoking 6 years ago. She reports alcohol use occasionally. Denies any illicit drug use.  Mechanical fall: CT head without acute intracranial or spinal injury.  CT abdomen pelvis with no intra abdominal injury or bleeding.  Meds: Current Facility-Administered Medications  Medication Dose Route Frequency Provider  Last Rate Last Admin  . 0.9 %  sodium chloride infusion  250 mL Intravenous PRN Stark Klein, MD      . carvedilol (COREG) tablet 3.125 mg  3.125 mg Oral BID WC Stark Klein, MD      . furosemide (LASIX) tablet 80 mg  80 mg Oral Daily Stark Klein, MD      . loperamide (IMODIUM) capsule 2 mg  2 mg Oral PRN Stark Klein, MD      . methocarbamol (ROBAXIN) tablet 500 mg  500 mg Oral Q8H PRN Stark Klein, MD      . morphine 2 MG/ML injection 1-2 mg  1-2 mg Intravenous Q1H PRN Stark Klein, MD   2 mg at 01/03/21 0316  . ondansetron (ZOFRAN-ODT) disintegrating tablet 4 mg  4 mg Oral Q6H PRN Stark Klein, MD       Or  . ondansetron (ZOFRAN) injection 4 mg  4 mg Intravenous Q6H PRN Stark Klein, MD      . oxyCODONE (Oxy IR/ROXICODONE) immediate release tablet 5-10 mg  5-10 mg Oral Q6H PRN Stark Klein, MD      . potassium chloride (KLOR-CON) CR tablet 10 mEq  10 mEq Oral Daily Stark Klein, MD      . promethazine (PHENERGAN) tablet 12.5 mg  12.5 mg Oral Q6H PRN Stark Klein, MD      . sodium chloride flush (NS) 0.9 % injection 3 mL  3 mL Intravenous Q12H Stark Klein, MD   3 mL at 01/03/21 0231  . sodium chloride flush (NS) 0.9 % injection 3 mL  3 mL Intravenous PRN Stark Klein, MD      . traMADol Veatrice Bourbon) tablet 50 mg  50 mg Oral Q6H PRN Stark Klein, MD      . vitamin B-12 (CYANOCOBALAMIN) tablet 1,000 mcg  1,000 mcg Oral Daily Stark Klein, MD        Allergies: Allergies as of 01/02/2021  . (No Known Allergies)   Past Medical History:  Diagnosis Date  . Arthritis    lumbar burst fx, osteoporosis  . Back pain 09/29/2013  . Blood dyscrasia   . Chest pain    denies  . Chronic kidney disease    passed stones spontaneously- 40 yrs. ago   . Depression    states her current situation with activity limitations, she has a low spirit at times.   Marland Kitchen Dysrhythmia    occ tachycardia  . Fracture of vertebra, compression ([place])   . GERD (gastroesophageal reflux disease)    occ  .  History of kidney stones   . HTN (hypertension)   . ITP (idiopathic thrombocytopenic purpura) 08/29/2013  . Leukocytosis, unspecified 09/05/2013  . Muscle cramp 11/16/2013  . Tachycardia    occ upon exersion, and when put on bp med   Past Surgical History:  Procedure Laterality Date  . ABDOMINAL HYSTERECTOMY    . ANTERIOR LAT LUMBAR FUSION N/A 03/28/2014   Procedure: Anterolateral decompression of L1 fracture with posterior segmental stabilization;  Surgeon: Kristeen Miss, MD;  Location: [facility];  Service: Neurosurgery;  Laterality: N/A;  Anterolateral decompression of Lumbar One fracture with posterior segmental stabilization  . CESAREAN SECTION     x2  . CHEST TUBE INSERTION Left 03/28/2014   Procedure: CHEST TUBE INSERTION;  Surgeon: Ivin Poot, MD;  Location: [facility];  Service: Thoracic;  Laterality: Left;  . INCISIONAL HERNIA REPAIR N/A 11/13/2014   Procedure: PRIMARY REPAIR INCARCERATED INCISIONAL HERNIA WITH PLACEMENT BIOLOGIC MESH;  Surgeon: Rolm Bookbinder, MD;  Location: [facility];  Service: General;  Laterality: N/A;  . LAPAROTOMY Right 09   ovarian cyst  . TOTAL ABDOMINAL HYSTERECTOMY W/ BILATERAL SALPINGOOPHORECTOMY  22   Family History  Problem Relation Age of Onset  . Hypertension Other   . Heart attack Other   . Leukemia Paternal Uncle        leukemia  . Heart attack Mother   . Hypertension Mother   . Heart disease Father    Social History   Socioeconomic History  . Marital status: Divorced    Spouse name: Not on file  . Number of children: Not on file  . Years of education: Not on file  . Highest education level: Not on file  Occupational History  . Occupation: Retired  . Occupation: Disabled  Tobacco Use  . Smoking status: Former Smoker    Packs/day: 1.00    Years: 15.00    Pack years: 15.00    Types: Cigarettes    Quit date: 09/15/2014    Years since quitting: 6.3  . Smokeless tobacco: Never Used  Vaping Use  . Vaping Use: Never used   Substance and Sexual Activity  . Alcohol use: Yes    Comment: rare  . Drug use: No  . Sexual activity: Not on file  Other Topics Concern  . Not on file  Social History Narrative   Current Social History 01/10/2020  Patient lives with daughter and daughter's fiance in a one level home. There are 3 steps with handrail up to the entrance the patient uses.       Patient's method of transportation is personal car. Patient no longer drives. Family member drives.      The highest level of education was high school diploma.      The patient currently retired and disabled.      Identified important Relationships are "My children and grandchildren (4 with one on the way)       Pets : 1 dog, 1 cat, and 2 birds.       Interests / Fun: Watch TV       Current Stressors: "Nothing really stresses me"       Religious / Personal Beliefs: "I believe in God and Jesus and know I'm going to heaven." ([place])       L. Ducatte, BSN, RN-BC       Social Determinants of Health   Financial Resource Strain: Not on file  Food Insecurity: Not on file  Transportation Needs: Not on file  Physical Activity: Not on file  Stress: Not on file  Social Connections: Not on file  Intimate Partner Violence: Not on file    Review of Systems: Pertinent items are noted in HPI.  Physical Exam: Blood pressure 113/74, pulse (!) 103, temperature 98.9 F (37.2 C), temperature source Oral, resp. rate 18, height 5' (1.524 m), weight 89.6 kg, SpO2 94 %. BP 113/74   Pulse (!) 103   Temp 98.9 F (37.2 C) (Oral)   Resp 18   Ht 5' (1.524 m)   Wt 89.6 kg   SpO2 94%   BMI 38.58 kg/m   General Appearance:    Obese. Alert, cooperative, but is uncomfortable due to rib pain when moving  Head:    Abrasion on the chin  Eyes:    Conjunctiva nl, EOM's intact  Lungs:     Clear to auscultation bilaterally, respirations unlabored  Chest Wall:    Tenderness to palpation at right lower ribs   Heart:     Regular rate and rhythm, S1 and S2 normal, systolic murmur  Abdomen:     Soft, non-tender  Extremities:   Lower extremities are wrapped  Pulses:   2+ and symmetric all extremities  Neurologic:   Alert and oriented x 3    Lab results: CBC Latest Ref Rng & Units 01/03/2021 01/02/2021 09/30/2020  WBC 4.0 - 10.5 K/uL 14.9(H) 12.7(H) 11.8(H)  Hemoglobin 12.0 - 15.0 g/dL 11.3(L) 11.5(L) 10.1(L)  Hematocrit 36.0 - 46.0 % 36.3 36.9 33.5(L)  Platelets 150 - 400 K/uL 75(L) 74(L) 84(L)   CMP Latest Ref Rng & Units 01/03/2021 01/02/2021 09/30/2020  Glucose 70 - 99 mg/dL 148(H) 139(H) 93  BUN 8 - 23 mg/dL 17 20 15   Creatinine 0.44 - 1.00 mg/dL 0.55 0.61 0.60  Sodium 135 - 145 mmol/L 139 141 142  Potassium 3.5 - 5.1 mmol/L 4.4 4.0 3.4(L)  Chloride 98 - 111 mmol/L 98 95(L) 96(L)  CO2 22 - 32 mmol/L 36(H) 37(H) 34(H)  Calcium 8.9 - 10.3 mg/dL 9.0 8.6(L) 8.8(L)  Total Protein 6.5 - 8.1 g/dL - - -  Total Bilirubin 0.3 - 1.2 mg/dL - - -  Alkaline Phos 38 - 126 U/L - - -  AST 15 - 41 U/L - - -  ALT 0 - 44 U/L - - -    Imaging results:  CT Abdomen Pelvis  Wo Contrast  Result Date: 01/02/2021 CLINICAL DATA:  Patient status post slip and fall today. EXAM: CT ABDOMEN AND PELVIS WITHOUT CONTRAST TECHNIQUE: Multidetector CT imaging of the abdomen and pelvis was performed following the standard protocol without IV contrast. COMPARISON:  CT abdomen and pelvis 11/13/2014. CT chest earlier today and 09/26/2020. FINDINGS: Lower chest: Basilar airspace disease and small right basilar pneumothorax or unchanged compared to the patient's chest CT earlier today. Hepatobiliary: Low attenuating lesion in the posterior right hepatic lobe compatible with hemangioma is unchanged since prior CT abdomen and pelvis. No other focal liver lesion. Multiple stones are seen in the gallbladder but there is no evidence of cholecystitis. The biliary tree appears normal. Pancreas: Unremarkable. No pancreatic ductal dilatation or surrounding  inflammatory changes. Spleen: Normal in size without focal abnormality. Adrenals/Urinary Tract: Adrenal glands are unremarkable. Kidneys are normal, without renal calculi, focal lesion, or hydronephrosis. Bladder is unremarkable. Stomach/Bowel: Stomach is within normal limits. No evidence of appendicitis. No evidence of bowel wall thickening, distention, or inflammatory changes. Vascular/Lymphatic: Aortic atherosclerosis. No enlarged abdominal or pelvic lymph nodes. The aorta is markedly tortuous with a straight right lateral orientation at the level of L2, unchanged. Reproductive: Status post hysterectomy. No adnexal masses. Other: Large ventral hernia is unchanged. Musculoskeletal: As seen on the prior exam, the patient is status post L1 corpectomy. There is extensive bony destructive change throughout the T12 vertebral body which appears new or worse compared to the patient's most recent chest CT. Marked subsidence of the corpectomy strut into the L2 vertebral body is unchanged. Remote compression fractures are seen at all remaining imaged thoracic and lumbar levels. IMPRESSION: Status post L1 corpectomy as seen on prior exams. Bony destructive change throughout the T12 vertebral body appears new or at least worse compared to the most recent comparison dated 09/26/2020. This finding could be due to infection, inflammatory change or possibly tumor. The appearance is not suggestive of fracture due to fall. MRI with and without contrast is the best next test for further evaluation. Remote compression fractures at all imaged thoracic and lumbar levels. Large ventral hernia. Gallstones without evidence of cholecystitis. Bibasilar airspace disease and small right basilar pneumothorax as seen on chest CT today. Electronically Signed   By: Inge Rise M.D.   On: 01/02/2021 20:03   CT Head Wo Contrast  Result Date: 01/02/2021 CLINICAL DATA:  70 year old female with fall. EXAM: CT HEAD WITHOUT CONTRAST CT CERVICAL  SPINE WITHOUT CONTRAST TECHNIQUE: Multidetector CT imaging of the head and cervical spine was performed following the standard protocol without intravenous contrast. Multiplanar CT image reconstructions of the cervical spine were also generated. COMPARISON:  Head CT dated 05/13/2020. FINDINGS: CT HEAD FINDINGS Brain: The ventricles and sulci appropriate size for patient's age. The gray-white matter discrimination is preserved. There is no acute intracranial hemorrhage. No mass effect or midline shift. No extra-axial fluid collection. Vascular: No hyperdense vessel or unexpected calcification. Skull: Normal. Negative for fracture or focal lesion. Sinuses/Orbits: Mild mucoperiosteal thickening of paranasal sinuses. No air-fluid level. Mastoid air cells are clear. Other: None CT CERVICAL SPINE FINDINGS Alignment: No acute subluxation. Skull base and vertebrae: No acute fracture. Osteopenia. Soft tissues and spinal canal: No prevertebral fluid or swelling. No visible canal hematoma. Disc levels:  No acute findings. Upper chest: Mild emphysema. Minimal right apical pneumothorax. Please see report for the chest CT. Other: None IMPRESSION: 1. No acute intracranial pathology. 2. No acute/traumatic cervical spine pathology. 3. Minimal right apical pneumothorax. Please see report for  the chest CT. Electronically Signed   By: Anner Crete M.D.   On: 01/02/2021 16:46   CT Chest Wo Contrast  Result Date: 01/02/2021 CLINICAL DATA:  Suspected rib fracture.  Fall. EXAM: CT CHEST WITHOUT CONTRAST TECHNIQUE: Multidetector CT imaging of the chest was performed following the standard protocol without IV contrast. COMPARISON:  09/26/2020 FINDINGS: Cardiovascular: Tortuous aorta. No aneurysm. Scattered coronary artery and aortic calcifications. Heart is borderline in size. Mediastinum/Nodes: No mediastinal, hilar, or axillary adenopathy. Trachea and esophagus are unremarkable. Bilateral thyroid nodules, the largest 12 mm in the  right thyroid lobe. Not clinically significant; no follow-up imaging recommended (ref: J Am Coll Radiol. 2015 Feb;12(2): 143-50). Lungs/Pleura: Linear areas of scarring or atelectasis in the mid and lower lungs bilaterally. No effusions. Small right pneumothorax noted in the lower chest. Upper Abdomen: Low-density lesion posteriorly in the liver measures 3.8 cm, similar to prior study. No acute findings. Musculoskeletal: Posterior right 9th and 10th rib fractures noted. No additional acute bony abnormality. Chest wall soft tissues are unremarkable. IMPRESSION: Posterior right 9th and 10th rib fractures. Tiny right basilar pneumothorax. Areas of atelectasis or scarring in the mid and lower lungs. Coronary artery disease. Aortic Atherosclerosis (ICD10-I70.0). Electronically Signed   By: Rolm Baptise M.D.   On: 01/02/2021 16:50   CT Cervical Spine Wo Contrast  Result Date: 01/02/2021 CLINICAL DATA:  70 year old female with fall. EXAM: CT HEAD WITHOUT CONTRAST CT CERVICAL SPINE WITHOUT CONTRAST TECHNIQUE: Multidetector CT imaging of the head and cervical spine was performed following the standard protocol without intravenous contrast. Multiplanar CT image reconstructions of the cervical spine were also generated. COMPARISON:  Head CT dated 05/13/2020. FINDINGS: CT HEAD FINDINGS Brain: The ventricles and sulci appropriate size for patient's age. The gray-white matter discrimination is preserved. There is no acute intracranial hemorrhage. No mass effect or midline shift. No extra-axial fluid collection. Vascular: No hyperdense vessel or unexpected calcification. Skull: Normal. Negative for fracture or focal lesion. Sinuses/Orbits: Mild mucoperiosteal thickening of paranasal sinuses. No air-fluid level. Mastoid air cells are clear. Other: None CT CERVICAL SPINE FINDINGS Alignment: No acute subluxation. Skull base and vertebrae: No acute fracture. Osteopenia. Soft tissues and spinal canal: No prevertebral fluid or  swelling. No visible canal hematoma. Disc levels:  No acute findings. Upper chest: Mild emphysema. Minimal right apical pneumothorax. Please see report for the chest CT. Other: None IMPRESSION: 1. No acute intracranial pathology. 2. No acute/traumatic cervical spine pathology. 3. Minimal right apical pneumothorax. Please see report for the chest CT. Electronically Signed   By: Anner Crete M.D.   On: 01/02/2021 16:46    Other results: EKG: normal EKG, normal sinus rhythm, unchanged from previous tracings, normal sinus rhythm.  Assessment, Plan, & Recommendations by Problem: Active Problems:   Closed traumatic fracture of ribs of right side with pneumothorax  70 year old female with PMHx of chronic hypoxic/hypercarbic respiratory failure on 3-4 L oxygen [place] at home, morbid obesity, HFpEF, chronic ITP, HTN, history of COVID-pneumonia infection (October 2021) L1 fracture and chronic back pain,  chronic lower leg wounds, who presented after a mechanical fall in the bathroom at home. She found to have ribs Fx and trace pneumothorax and admitted in trauma surgery service. IMTS consulted for management of chronic conditions. Also to assess patient's home health need vs SNF placement if needed.  Ribs fracture 2/2 traumatic injury after a fall with small pneumothorax:  Posterior right ninth and 10th rib fracture with tiny right basilar pneumothorax: Trauma surgery was consulted and admitted  the patient.    -Getting pain control, pulmonary toilet, PT OT per trauma surgery -RN is aware about not administering opioids pain meds at the same time to prevent respiratory arrest  T 12 destructive change: reported on CT scan Per radiology report, it can be infection, inflammatory change or possibly tumor but no fracture. Recommended MRI with and without contrast next step for further evaluation.  -Recommending further evaluation with MRI   HFpEF Chronic hypoxic/hypercapnic Respiratory failure: On 3 L  oxygen at home. Reports compliance to home meds and denies worsening of SOB, leg swelling,..  Chronic lower extremity edema but otherwise no evidence of acute hypervolemia and no crackles.  Is on 3 L [place] at home. -Continue home occasions: Coreg 3.125 mg twice daily, Lasix 80 mg p.o. daily, kcl -Weight daily and strict INO's   Signed: Dewayne Hatch, MD 01/03/2021, 5:53 AM

## 2021-01-03 NOTE — Progress Notes (Signed)
Orthopedic Tech Progress Note Patient Details:  NATALEE TOMKIEWICZ 12-Jan-1951 [id number]  Ortho Devices Type of Ortho Device: Louretta Parma boot Ortho Device/Splint Location: BLE Ortho Device/Splint Interventions: Ordered,Application,Adjustment   Post Interventions Patient Tolerated: Fair Instructions Provided: Care of device,Poper ambulation with device   Sima Lindenberger 01/03/2021, 7:09 PM

## 2021-01-03 NOTE — Progress Notes (Addendum)
   Subjective:   [name] states she fell while emptying her portapotty. However, when she emptying it, it hit the side of the sink and spilled on the ground. She took a step and then slipped and fell. She denies any head trauma. She denies any fever, chills, dizziness,   Normally gets around with a walker. Has otherwise been in her usual state of health. Denies shortness of breath, fever, or other recent illness.  She states her Unna boots are from home. A HH nurse comes to change them, once every 2 weeks at this time. She has feeling in her feet, but does experience tingling.   IMTS taking over as primary team this afternoon.  Objective:  Vital signs in last 24 hours: Vitals:   01/02/21 2230 01/02/21 2300 01/02/21 2330 01/03/21 0221  BP: 131/65 (!) 131/59 135/72 113/74  Pulse: 97 95 99 (!) 103  Resp: 12 (!) 23 (!) 25 18  Temp:    98.9 F (37.2 C)  TempSrc:    Oral  SpO2: 95% 92% 97% 94%  Weight:      Height:       Physical Exam Vitals and nursing note reviewed.  Constitutional: no acute distress Head: atraumatic Cardiovascular: regular rate and rhythm, normal heart sounds Pulmonary: effort normal, normal breath sounds bilaterally, no wheezing Musculoskeletal: pain over right ribs Skin: warm and dry, large abrasion on chin reportedly from cervical collar, bilateral lower extremities with chronic venous changes, wraps removed and no open wounds at this time, dry skin Neurological: alert, no focal deficit Psychiatric: normal mood and affect   Assessment/Plan:  [name] is a 70 y/o F with hx of chronic respiratory failure on 3-4L chronically, morbid obesity, HFpEF, chronic ITP, chronic leg wounds who presents with rib fractures after mechanical fall. Otherwise medically stable.  Active Problems:   Closed traumatic fracture of ribs of right side with pneumothorax  Ribs fracture due to mechanical fall with small pneumothorax  Mechanical fall in bathroom, struck  posterior ribs against bath tub.  Pain with deep breaths, but we discussed importance of preventing pneumonia and she understands.  Respiratory status is at her baseline, saturating well on her home oxygen.  Noted to have slightly higher bicarb and normal, likely due to shallow respirations.  -Admitted to trauma surgery, we will take over as primary team, appreciate their assistance -Multimodal pain control with  Tylenol, Lidoderm patch, cold compress, oxycodone, Dilaudid -Pulm toilet, incentive spirometer -PT/OT.  Likely home health, patient vehemently declines SNF due to previous experience - Repeat chest x-ray tomorrow morning to assess pneumothorax - Continue home oxygen   T 12 destructive change Noted on CT scan to have interval destructive change of T12 since last CT in January, favoring infectious versus inflammatory versus tumor but unlikely related to trauma.  Recommend MRI for further work-up.  -Follow-up MRI T-spine   HFpEF Chronic hypoxic/hypercapnic Respiratory failure:  No signs of current exacerbation.  - Continue home medications of Coreg, Lasix, and Klor-Con - Continue home oxygen  Chronic Venous Stasis -- Replace Unna wrappings today    VTE prophylaxis: holding off with her thrombocytopenia Prior to Admission Living Arrangement: home Anticipated Discharge Location: pending Barriers to Discharge: medical workup, pain control Dispo: Anticipated discharge in approximately 1-2 day(s).   Andrew Au, MD 01/03/2021, 10:05 AM Pager: [phone] After 5pm on weekdays and 1pm on weekends: On Call pager [phone]

## 2021-01-04 ENCOUNTER — Inpatient Hospital Stay (HOSPITAL_COMMUNITY): Payer: Medicare PPO

## 2021-01-04 ENCOUNTER — Observation Stay (HOSPITAL_COMMUNITY): Payer: Medicare PPO

## 2021-01-04 DIAGNOSIS — L89302 Pressure ulcer of unspecified buttock, stage 2: Secondary | ICD-10-CM | POA: Diagnosis present

## 2021-01-04 DIAGNOSIS — M79605 Pain in left leg: Secondary | ICD-10-CM | POA: Diagnosis not present

## 2021-01-04 DIAGNOSIS — J9621 Acute and chronic respiratory failure with hypoxia: Secondary | ICD-10-CM | POA: Diagnosis present

## 2021-01-04 DIAGNOSIS — G4733 Obstructive sleep apnea (adult) (pediatric): Secondary | ICD-10-CM | POA: Diagnosis not present

## 2021-01-04 DIAGNOSIS — S2241XA Multiple fractures of ribs, right side, initial encounter for closed fracture: Secondary | ICD-10-CM | POA: Diagnosis not present

## 2021-01-04 DIAGNOSIS — K219 Gastro-esophageal reflux disease without esophagitis: Secondary | ICD-10-CM | POA: Diagnosis present

## 2021-01-04 DIAGNOSIS — I517 Cardiomegaly: Secondary | ICD-10-CM | POA: Diagnosis not present

## 2021-01-04 DIAGNOSIS — S270XXA Traumatic pneumothorax, initial encounter: Secondary | ICD-10-CM

## 2021-01-04 DIAGNOSIS — S2239XA Fracture of one rib, unspecified side, initial encounter for closed fracture: Secondary | ICD-10-CM | POA: Diagnosis not present

## 2021-01-04 DIAGNOSIS — I872 Venous insufficiency (chronic) (peripheral): Secondary | ICD-10-CM | POA: Diagnosis present

## 2021-01-04 DIAGNOSIS — J90 Pleural effusion, not elsewhere classified: Secondary | ICD-10-CM | POA: Diagnosis not present

## 2021-01-04 DIAGNOSIS — G9341 Metabolic encephalopathy: Secondary | ICD-10-CM | POA: Diagnosis not present

## 2021-01-04 DIAGNOSIS — M1990 Unspecified osteoarthritis, unspecified site: Secondary | ICD-10-CM | POA: Diagnosis present

## 2021-01-04 DIAGNOSIS — D62 Acute posthemorrhagic anemia: Secondary | ICD-10-CM | POA: Diagnosis present

## 2021-01-04 DIAGNOSIS — I5032 Chronic diastolic (congestive) heart failure: Secondary | ICD-10-CM | POA: Diagnosis present

## 2021-01-04 DIAGNOSIS — J942 Hemothorax: Secondary | ICD-10-CM | POA: Diagnosis not present

## 2021-01-04 DIAGNOSIS — S2231XA Fracture of one rib, right side, initial encounter for closed fracture: Secondary | ICD-10-CM | POA: Diagnosis not present

## 2021-01-04 DIAGNOSIS — R0603 Acute respiratory distress: Secondary | ICD-10-CM

## 2021-01-04 DIAGNOSIS — S2249XA Multiple fractures of ribs, unspecified side, initial encounter for closed fracture: Secondary | ICD-10-CM

## 2021-01-04 DIAGNOSIS — J9311 Primary spontaneous pneumothorax: Secondary | ICD-10-CM | POA: Diagnosis not present

## 2021-01-04 DIAGNOSIS — W1809XA Striking against other object with subsequent fall, initial encounter: Secondary | ICD-10-CM | POA: Diagnosis present

## 2021-01-04 DIAGNOSIS — J9811 Atelectasis: Secondary | ICD-10-CM | POA: Diagnosis present

## 2021-01-04 DIAGNOSIS — Z9689 Presence of other specified functional implants: Secondary | ICD-10-CM | POA: Diagnosis not present

## 2021-01-04 DIAGNOSIS — Z20822 Contact with and (suspected) exposure to covid-19: Secondary | ICD-10-CM | POA: Diagnosis present

## 2021-01-04 DIAGNOSIS — S272XXA Traumatic hemopneumothorax, initial encounter: Secondary | ICD-10-CM | POA: Diagnosis present

## 2021-01-04 DIAGNOSIS — M810 Age-related osteoporosis without current pathological fracture: Secondary | ICD-10-CM | POA: Diagnosis present

## 2021-01-04 DIAGNOSIS — M79602 Pain in left arm: Secondary | ICD-10-CM | POA: Diagnosis not present

## 2021-01-04 DIAGNOSIS — M62838 Other muscle spasm: Secondary | ICD-10-CM | POA: Diagnosis present

## 2021-01-04 DIAGNOSIS — M899 Disorder of bone, unspecified: Secondary | ICD-10-CM | POA: Diagnosis not present

## 2021-01-04 DIAGNOSIS — D693 Immune thrombocytopenic purpura: Secondary | ICD-10-CM | POA: Diagnosis present

## 2021-01-04 DIAGNOSIS — D649 Anemia, unspecified: Secondary | ICD-10-CM | POA: Diagnosis not present

## 2021-01-04 DIAGNOSIS — J9622 Acute and chronic respiratory failure with hypercapnia: Secondary | ICD-10-CM | POA: Diagnosis present

## 2021-01-04 DIAGNOSIS — G8929 Other chronic pain: Secondary | ICD-10-CM | POA: Diagnosis present

## 2021-01-04 DIAGNOSIS — E662 Morbid (severe) obesity with alveolar hypoventilation: Secondary | ICD-10-CM | POA: Diagnosis present

## 2021-01-04 DIAGNOSIS — J449 Chronic obstructive pulmonary disease, unspecified: Secondary | ICD-10-CM | POA: Diagnosis present

## 2021-01-04 DIAGNOSIS — Z6838 Body mass index (BMI) 38.0-38.9, adult: Secondary | ICD-10-CM | POA: Diagnosis not present

## 2021-01-04 DIAGNOSIS — F32A Depression, unspecified: Secondary | ICD-10-CM | POA: Diagnosis present

## 2021-01-04 DIAGNOSIS — S2241XG Multiple fractures of ribs, right side, subsequent encounter for fracture with delayed healing: Secondary | ICD-10-CM | POA: Diagnosis not present

## 2021-01-04 DIAGNOSIS — W1800XA Striking against unspecified object with subsequent fall, initial encounter: Secondary | ICD-10-CM | POA: Diagnosis present

## 2021-01-04 DIAGNOSIS — J811 Chronic pulmonary edema: Secondary | ICD-10-CM | POA: Diagnosis not present

## 2021-01-04 DIAGNOSIS — I110 Hypertensive heart disease with heart failure: Secondary | ICD-10-CM | POA: Diagnosis present

## 2021-01-04 DIAGNOSIS — S32011A Stable burst fracture of first lumbar vertebra, initial encounter for closed fracture: Secondary | ICD-10-CM | POA: Diagnosis present

## 2021-01-04 LAB — CBC WITH DIFFERENTIAL/PLATELET
Abs Immature Granulocytes: 0.21 10*3/uL — ABNORMAL HIGH (ref 0.00–0.07)
Basophils Absolute: 0 10*3/uL (ref 0.0–0.1)
Basophils Relative: 0 %
Eosinophils Absolute: 0.2 10*3/uL (ref 0.0–0.5)
Eosinophils Relative: 1 %
HCT: 35.4 % — ABNORMAL LOW (ref 36.0–46.0)
Hemoglobin: 10.8 g/dL — ABNORMAL LOW (ref 12.0–15.0)
Immature Granulocytes: 1 %
Lymphocytes Relative: 6 %
Lymphs Abs: 0.9 10*3/uL (ref 0.7–4.0)
MCH: 28.1 pg (ref 26.0–34.0)
MCHC: 30.5 g/dL (ref 30.0–36.0)
MCV: 92.2 fL (ref 80.0–100.0)
Monocytes Absolute: 0.8 10*3/uL (ref 0.1–1.0)
Monocytes Relative: 5 %
Neutro Abs: 12.9 10*3/uL — ABNORMAL HIGH (ref 1.7–7.7)
Neutrophils Relative %: 87 %
Platelets: 65 10*3/uL — ABNORMAL LOW (ref 150–400)
RBC: 3.84 MIL/uL — ABNORMAL LOW (ref 3.87–5.11)
RDW: 15 % (ref 11.5–15.5)
WBC: 15 10*3/uL — ABNORMAL HIGH (ref 4.0–10.5)
nRBC: 0 % (ref 0.0–0.2)

## 2021-01-04 LAB — HEPATIC FUNCTION PANEL
ALT: 36 U/L (ref 0–44)
AST: 23 U/L (ref 15–41)
Albumin: 3.5 g/dL (ref 3.5–5.0)
Alkaline Phosphatase: 90 U/L (ref 38–126)
Bilirubin, Direct: 0.2 mg/dL (ref 0.0–0.2)
Indirect Bilirubin: 0.6 mg/dL (ref 0.3–0.9)
Total Bilirubin: 0.8 mg/dL (ref 0.3–1.2)
Total Protein: 6.2 g/dL — ABNORMAL LOW (ref 6.5–8.1)

## 2021-01-04 LAB — BASIC METABOLIC PANEL
Anion gap: 4 — ABNORMAL LOW (ref 5–15)
BUN: 18 mg/dL (ref 8–23)
CO2: 41 mmol/L — ABNORMAL HIGH (ref 22–32)
Calcium: 8.9 mg/dL (ref 8.9–10.3)
Chloride: 93 mmol/L — ABNORMAL LOW (ref 98–111)
Creatinine, Ser: 0.67 mg/dL (ref 0.44–1.00)
GFR, Estimated: >60 mL/min (ref >60)
Glucose, Bld: 164 mg/dL — ABNORMAL HIGH (ref 70–99)
Potassium: 4.6 mmol/L (ref 3.5–5.1)
Sodium: 138 mmol/L (ref 135–145)

## 2021-01-04 LAB — PROCALCITONIN: Procalcitonin: <0.1 ng/mL

## 2021-01-04 LAB — GLUCOSE, CAPILLARY
Glucose-Capillary: 164 mg/dL — ABNORMAL HIGH (ref 70–99)
Glucose-Capillary: 161 mg/dL — ABNORMAL HIGH (ref 70–99)

## 2021-01-04 LAB — BLOOD GAS, ARTERIAL
Acid-Base Excess: 12.3 mmol/L — ABNORMAL HIGH (ref 0.0–2.0)
Bicarbonate: 40.9 mmol/L — ABNORMAL HIGH (ref 20.0–28.0)
Drawn by: 164
FIO2: 100
O2 Saturation: 97.1 %
Patient temperature: 36.6
pCO2 arterial: 114 mmHg (ref 32.0–48.0)
pH, Arterial: 7.176 — CL (ref 7.350–7.450)
pO2, Arterial: 97.9 mmHg (ref 83.0–108.0)

## 2021-01-04 MED ORDER — ACETAMINOPHEN 500 MG PO TABS
1000.0000 mg | ORAL_TABLET | Freq: Three times a day (TID) | ORAL | Status: DC
  Administered 2021-01-04 – 2021-01-05 (×4): 1000 mg via ORAL
  Filled 2021-01-04 (×5): qty 2

## 2021-01-04 MED ORDER — METHOCARBAMOL 500 MG PO TABS
750.0000 mg | ORAL_TABLET | Freq: Three times a day (TID) | ORAL | Status: DC
  Administered 2021-01-04 – 2021-01-05 (×5): 750 mg via ORAL
  Filled 2021-01-04 (×6): qty 2

## 2021-01-04 MED ORDER — FUROSEMIDE 10 MG/ML IJ SOLN
80.0000 mg | Freq: Once | INTRAMUSCULAR | Status: AC
  Administered 2021-01-04: 80 mg via INTRAVENOUS
  Filled 2021-01-04: qty 8

## 2021-01-04 MED ORDER — HYDROMORPHONE HCL 1 MG/ML IJ SOLN: 1.0000 mg | INTRAMUSCULAR | Status: DC | PRN

## 2021-01-04 MED ORDER — IPRATROPIUM-ALBUTEROL 0.5-2.5 (3) MG/3ML IN SOLN
3.0000 mL | Freq: Four times a day (QID) | RESPIRATORY_TRACT | Status: DC | PRN
  Administered 2021-01-04: 3 mL via RESPIRATORY_TRACT
  Filled 2021-01-04 (×2): qty 3

## 2021-01-04 MED ORDER — OXYCODONE HCL 5 MG PO TABS
5.0000 mg | ORAL_TABLET | Freq: Four times a day (QID) | ORAL | Status: DC | PRN
  Administered 2021-01-04: 5 mg via ORAL
  Filled 2021-01-04: qty 1

## 2021-01-04 NOTE — Progress Notes (Signed)
Subjective:   Overnight, patient was noted to have a desaturation event on her home oxygen. Was noted to be breathing primarily through her mouth while asleep, has hx of OSA and OHS but does not have CPAP at home. Saturation improved after placing on CPAP.   This morning, [name] states she is still having back pain from her fall. She feels generally weak but no focal weakness. No numbness or tingling. She denies cough.   She denies ever having a mammogram. She denies night sweats. Denies any close family hx of cancer.   We discussed her MRI findings. Patient followed with Dr. Ellene Route in the past for a traumatic L1 fracture. She would prefer he be contacted regarding her T12 lesion.   Objective:  Vital signs in last 24 hours: Vitals:   01/04/21 0200 01/04/21 0208 01/04/21 0300 01/04/21 0411  BP:  (!) 185/162 (!) 145/88 (!) 145/88  Pulse: (!) 102 96 96   Resp: (!) 25 20 20    Temp:  98.7 F (37.1 C) 98.8 F (37.1 C)   TempSrc:  Oral Axillary   SpO2: 97% 96% 98% 98%  Weight:      Height:       Physical Exam Vitals and nursing note reviewed.  Constitutional: no acute distress Head: atraumatic Cardiovascular: regular rate and rhythm, normal heart sounds Pulmonary: effort normal, normal breath sounds bilaterally, no wheezing Musculoskeletal: pain over right ribs Skin: warm and dry, large abrasion on chin reportedly from cervical collar, bilateral lower extremities with unna boots on Neurological: alert, no focal deficit Psychiatric: normal mood and affect   Assessment/Plan:  [name] is a 70 y/o F with hx of chronic respiratory failure on 3-4L chronically, morbid obesity, HFpEF, chronic ITP, chronic leg wounds who presents with rib fractures after mechanical fall. Otherwise medically stable.  Active Problems:   Multiple closed fractures of ribs of right side   Pneumothorax  Ribs fracture due to mechanical fall with small pneumothorax  Mechanical fall in bathroom,  struck posterior ribs against bath tub.  Discussed importance of adequate respirations for pneumonia prevention and she understands. Had episode of desat overnight, likely related to OSA, improved on CPAP. Currently on 5L by [place], her home O2 is 3-4L. Her small pneumothorax is still not visible on CXR this morning.  -Multimodal pain control with  Tylenol, Lidoderm patch, cold compress, oxycodone, Dilaudid -Pulm toilet, incentive spirometer -PT/OT.  Likely home health, patient declines SNF due to previous experience -Repeat chest x-ray as needed if respiratory status worsens -CPAP at night -Continue supplemental oxygen   T 12 destructive change suspicious for tumor MRI is limited by motion but found destruction of T12 with signal intensity suspicious for tumor. No know primary tumor, no other tumors seen on admission non-con CT of cervical spine, abd/pelvis, chest, head. Has never had a mammogram, breast exam without overt abnormality.  She has history of a large ovarian mass in 2009 which was excised by gyn onc and found to be benign serous cystadenoma. She also had a liver lesion noted at that time which on MRI was found to be a hemangioma. Negative CEA and CA 19-9 at that time. Has previously received rituximab for ITP.  - will consult neurosurgery - outpatient mammogram, colonoscopy   HFpEF Chronic hypoxic/hypercapnic Respiratory failure:  CXR with worsening opacity, suspect component of fluid overload.  - Continue home medications of Coreg, and Klor-Con - Lasix 80mg  IV today - Continue home oxygen  Chronic Venous Stasis -- Replace  Unna wrappings today    VTE prophylaxis: holding off with her thrombocytopenia Prior to Admission Living Arrangement: home Anticipated Discharge Location: pending Barriers to Discharge: medical workup, pain control Dispo: Anticipated discharge in approximately 1-2 day(s).   Andrew Au, MD 01/04/2021, 6:00 AM Pager: [phone] After 5pm on weekdays  and 1pm on weekends: On Call pager [phone]

## 2021-01-04 NOTE — Progress Notes (Signed)
Attempted to call daughter [name] to notify of room change, with no answer. Mail box full, and unable to leave a message.

## 2021-01-04 NOTE — Consult Note (Addendum)
Reason for Consult: Destructive lesion of T12 Referring Physician: Dr. Verl Bangs is an 70 y.o. female.  HPI: [name] is a 70 year old individual whom I have treated in the past for an L1 burst fracture with retropulsion of bone.  She underwent an anterior decompression with reconstruction using an anterior interbody spacer.  She had osteoporosis and developed some implosion of the construct with a resultant kyphosis.  Here recently she is been diagnosed with a lesion of the T12 vertebrae which has caused further compression superiorly and the T12 vertebrae but there is no retropulsion or significant mass-effect posteriorly.  The lesion is consistent either with an infectious etiology or possibly neoplasm.  I have reviewed the MRI and note the concerns with a destructive lesion in T12.  Does not appear that the patient has been febrile however she has been becoming increasingly confused and she has been requiring increasing amounts of oxygen.  I discussed the situation with Dr. Bridgett Larsson today and I suggested that it may be best to obtain needle biopsy by interventional radiology to make a diagnosis of either tumor or infection.  If this is an infectious process she will likely need extensive debridement and reconstruction.  If tumor then it depends on where the primary is from.  Past Medical History:  Diagnosis Date  . Arthritis    lumbar burst fx, osteoporosis  . Back pain 09/29/2013  . Blood dyscrasia   . Chest pain    denies  . Chronic kidney disease    passed stones spontaneously- 40 yrs. ago   . Depression    states her current situation with activity limitations, she has a low spirit at times.   Marland Kitchen Dysrhythmia    occ tachycardia  . Fracture of vertebra, compression ([employer])   . GERD (gastroesophageal reflux disease)    occ  . History of kidney stones   . HTN (hypertension)   . ITP (idiopathic thrombocytopenic purpura) 08/29/2013  . Leukocytosis, unspecified 09/05/2013  .  Muscle cramp 11/16/2013  . Tachycardia    occ upon exersion, and when put on bp med    Past Surgical History:  Procedure Laterality Date  . ABDOMINAL HYSTERECTOMY    . ANTERIOR LAT LUMBAR FUSION N/A 03/28/2014   Procedure: Anterolateral decompression of L1 fracture with posterior segmental stabilization;  Surgeon: Kristeen Miss, MD;  Location: [facility];  Service: Neurosurgery;  Laterality: N/A;  Anterolateral decompression of Lumbar One fracture with posterior segmental stabilization  . CESAREAN SECTION     x2  . CHEST TUBE INSERTION Left 03/28/2014   Procedure: CHEST TUBE INSERTION;  Surgeon: Ivin Poot, MD;  Location: [facility];  Service: Thoracic;  Laterality: Left;  . INCISIONAL HERNIA REPAIR N/A 11/13/2014   Procedure: PRIMARY REPAIR INCARCERATED INCISIONAL HERNIA WITH PLACEMENT BIOLOGIC MESH;  Surgeon: Rolm Bookbinder, MD;  Location: [facility];  Service: General;  Laterality: N/A;  . LAPAROTOMY Right 09   ovarian cyst  . TOTAL ABDOMINAL HYSTERECTOMY W/ BILATERAL SALPINGOOPHORECTOMY  85    Family History  Problem Relation Age of Onset  . Hypertension Other   . Heart attack Other   . Leukemia Paternal Uncle        leukemia  . Heart attack Mother   . Hypertension Mother   . Heart disease Father     Social History:  reports that she quit smoking about 6 years ago. Her smoking use included cigarettes. She has a 15.00 pack-year smoking history. She has never  used smokeless tobacco. She reports current alcohol use. She reports that she does not use drugs.  Allergies: No Known Allergies  Medications: I have reviewed the patient's current medications.  Results for orders placed or performed during the hospital encounter of 01/02/21 (from the past 48 hour(s))  CBC     Status: Abnormal   Collection Time: 01/03/21  1:51 AM  Result Value Ref Range   WBC 14.9 (H) 4.0 - 10.5 K/uL   RBC 4.04 3.87 - 5.11 MIL/uL   Hemoglobin 11.3 (L) 12.0 - 15.0 g/dL   HCT 36.3 36.0 - 46.0 %    MCV 89.9 80.0 - 100.0 fL   MCH 28.0 26.0 - 34.0 pg   MCHC 31.1 30.0 - 36.0 g/dL   RDW 14.6 11.5 - 15.5 %   Platelets 75 (L) 150 - 400 K/uL    Comment: Immature Platelet Fraction may be clinically indicated, consider ordering this additional test [id number] CONSISTENT WITH PREVIOUS RESULT REPEATED TO VERIFY    nRBC 0.0 0.0 - 0.2 %    Comment: Performed at [facility], [place] [address] [place], [place] [address]  Basic metabolic panel     Status: Abnormal   Collection Time: 01/03/21  1:51 AM  Result Value Ref Range   Sodium 139 135 - 145 mmol/L   Potassium 4.4 3.5 - 5.1 mmol/L   Chloride 98 98 - 111 mmol/L   CO2 36 (H) 22 - 32 mmol/L   Glucose, Bld 148 (H) 70 - 99 mg/dL    Comment: Glucose reference range applies only to samples taken after fasting for at least 8 hours.   BUN 17 8 - 23 mg/dL   Creatinine, Ser 0.55 0.44 - 1.00 mg/dL   Calcium 9.0 8.9 - 10.3 mg/dL   GFR, Estimated >60 >60 mL/min    Comment: (NOTE) Calculated using the CKD-EPI Creatinine Equation (2021)    Anion gap 5 5 - 15    Comment: Performed at [facility] [address], [place], [place] [address]  Basic metabolic panel     Status: Abnormal   Collection Time: 01/04/21 12:49 AM  Result Value Ref Range   Sodium 138 135 - 145 mmol/L   Potassium 4.6 3.5 - 5.1 mmol/L   Chloride 93 (L) 98 - 111 mmol/L   CO2 41 (H) 22 - 32 mmol/L   Glucose, Bld 164 (H) 70 - 99 mg/dL    Comment: Glucose reference range applies only to samples taken after fasting for at least 8 hours.   BUN 18 8 - 23 mg/dL   Creatinine, Ser 0.67 0.44 - 1.00 mg/dL   Calcium 8.9 8.9 - 10.3 mg/dL   GFR, Estimated >60 >60 mL/min    Comment: (NOTE) Calculated using the CKD-EPI Creatinine Equation (2021)    Anion gap 4 (L) 5 - 15    Comment: Performed at [facility] [address], [place], [place] [address]  CBC with Differential/Platelet     Status: Abnormal   Collection Time: 01/04/21 12:49 AM  Result Value Ref Range    WBC 15.0 (H) 4.0 - 10.5 K/uL   RBC 3.84 (L) 3.87 - 5.11 MIL/uL   Hemoglobin 10.8 (L) 12.0 - 15.0 g/dL   HCT 35.4 (L) 36.0 - 46.0 %   MCV 92.2 80.0 - 100.0 fL   MCH 28.1 26.0 - 34.0 pg   MCHC 30.5 30.0 - 36.0 g/dL   RDW 15.0 11.5 - 15.5 %   Platelets  65 (L) 150 - 400 K/uL    Comment: Immature Platelet Fraction may be clinically indicated, consider ordering this additional test [id number] CONSISTENT WITH PREVIOUS RESULT REPEATED TO VERIFY    nRBC 0.0 0.0 - 0.2 %   Neutrophils Relative % 87 %   Neutro Abs 12.9 (H) 1.7 - 7.7 K/uL   Lymphocytes Relative 6 %   Lymphs Abs 0.9 0.7 - 4.0 K/uL   Monocytes Relative 5 %   Monocytes Absolute 0.8 0.1 - 1.0 K/uL   Eosinophils Relative 1 %   Eosinophils Absolute 0.2 0.0 - 0.5 K/uL   Basophils Relative 0 %   Basophils Absolute 0.0 0.0 - 0.1 K/uL   Immature Granulocytes 1 %   Abs Immature Granulocytes 0.21 (H) 0.00 - 0.07 K/uL    Comment: Performed at [facility] [address], [place], [place] [address]  Procalcitonin - Baseline     Status: None   Collection Time: 01/04/21 12:49 AM  Result Value Ref Range   Procalcitonin <0.10 ng/mL    Comment:        Interpretation: PCT (Procalcitonin) <= 0.5 ng/mL: Systemic infection (sepsis) is not likely. Local bacterial infection is possible. (NOTE)       Sepsis PCT Algorithm           Lower Respiratory Tract                                      Infection PCT Algorithm    ----------------------------     ----------------------------         PCT < 0.25 ng/mL                PCT < 0.10 ng/mL          Strongly encourage             Strongly discourage   discontinuation of antibiotics    initiation of antibiotics    ----------------------------     -----------------------------       PCT 0.25 - 0.50 ng/mL            PCT 0.10 - 0.25 ng/mL               OR       >80% decrease in PCT            Discourage initiation of                                            antibiotics      Encourage  discontinuation           of antibiotics    ----------------------------     -----------------------------         PCT >= 0.50 ng/mL              PCT 0.26 - 0.50 ng/mL               AND        <80% decrease in PCT             Encourage initiation of  antibiotics       Encourage continuation           of antibiotics    ----------------------------     -----------------------------        PCT >= 0.50 ng/mL                  PCT > 0.50 ng/mL               AND         increase in PCT                  Strongly encourage                                      initiation of antibiotics    Strongly encourage escalation           of antibiotics                                     -----------------------------                                           PCT <= 0.25 ng/mL                                                 OR                                        > 80% decrease in PCT                                      Discontinue / Do not initiate                                             antibiotics  Performed at [facility], [address], [place], [place] [address]   Hepatic function panel     Status: Abnormal   Collection Time: 01/04/21 12:49 AM  Result Value Ref Range   Total Protein 6.2 (L) 6.5 - 8.1 g/dL   Albumin 3.5 3.5 - 5.0 g/dL   AST 23 15 - 41 U/L   ALT 36 0 - 44 U/L   Alkaline Phosphatase 90 38 - 126 U/L   Total Bilirubin 0.8 0.3 - 1.2 mg/dL   Bilirubin, Direct 0.2 0.0 - 0.2 mg/dL   Indirect Bilirubin 0.6 0.3 - 0.9 mg/dL    Comment: Performed at [facility] [address], [place], [place] [address]  Glucose, capillary     Status: Abnormal   Collection Time: 01/04/21  4:33 PM  Result Value Ref Range   Glucose-Capillary 164 (H) 70 - 99 mg/dL    Comment: Glucose reference range applies only to samples taken after fasting for at least  8 hours.  Blood gas, arterial     Status: Abnormal   Collection Time: 01/04/21   4:50 PM  Result Value Ref Range   FIO2 100.00    pH, Arterial 7.176 (LL) 7.350 - 7.450    Comment: CRITICAL RESULT CALLED TO, READ BACK BY AND VERIFIED WITH: LINH. HUYNH RN [id number] 01/04/21 LIN AUNG    pCO2 arterial 114 (HH) 32.0 - 48.0 mmHg    Comment: CRITICAL RESULT CALLED TO, READ BACK BY AND VERIFIED WITH: LIHN. HUYNG RN [id number] 01/04/21 LIN AUNG    pO2, Arterial 97.9 83.0 - 108.0 mmHg   Bicarbonate 40.9 (H) 20.0 - 28.0 mmol/L   Acid-Base Excess 12.3 (H) 0.0 - 2.0 mmol/L   O2 Saturation 97.1 %   Patient temperature 36.6    Collection site RIGHT RADIAL    Drawn by 164    Sample type ARTERIAL    Allens test (pass/fail) PASS PASS    Comment: Performed at [facility] [address], [place], [place] [address]  Glucose, capillary     Status: Abnormal   Collection Time: 01/04/21  9:00 PM  Result Value Ref Range   Glucose-Capillary 161 (H) 70 - 99 mg/dL    Comment: Glucose reference range applies only to samples taken after fasting for at least 8 hours.    MR THORACIC SPINE WO CONTRAST  Result Date: 01/03/2021 CLINICAL DATA:  [place].  Back pain. EXAM: MRI THORACIC SPINE WITHOUT CONTRAST TECHNIQUE: Multiplanar, multisequence MR imaging of the thoracic spine was performed. No intravenous contrast was administered. COMPARISON:  CT scan 01/02/2021 FINDINGS: Examination is limited by patient motion and the patient could not complete the examination. Alignment: Exaggerated thoracic kyphosis. The thoracic vertebral bodies are grossly normally aligned. Vertebrae: As demonstrated on the CT scan the T12 vertebral body is partially destroyed. There is abnormal low T1 and high T2 and STIR signal intensity suspicious for tumor. No spinal canal compromise. I do not see any similar findings involving the other vertebral bodies. Remote compression fractures are noted in the thoracic spine and there are T4 and T8 hemangioma is noted. Cord:  Grossly normal thoracic spinal cord. Paraspinal and other soft  tissues: No significant paraspinal findings. Disc levels: No obvious large thoracic disc protrusions or significant canal compromise. IMPRESSION: 1. Limited examination due to patient motion and the patient could not complete the examination. 2. The T12 vertebral body is partially destroyed and demonstrates abnormal signal intensity suspicious for tumor. No spinal canal compromise. 3. Remote compression fractures of the thoracic spine. 4. No obvious large thoracic disc protrusions or significant canal compromise. Electronically Signed   By: Marijo Sanes M.D.   On: 01/03/2021 17:32   DG CHEST PORT 1 VIEW  Result Date: 01/04/2021 CLINICAL DATA:  Shortness of breath.  History of pneumothorax. EXAM: PORTABLE CHEST 1 VIEW COMPARISON:  Earlier today FINDINGS: Stable cardiomediastinal contours. Increased bilateral pleural effusions. Subsegmental atelectasis or scar within the perihilar left mid lung is again noted. Bandlike opacity within the right mid and right upper lung is similar to previous exam and is favored to represent fluid tracking along the fissure. No visible pneumothorax. IMPRESSION: 1. Increased bilateral pleural effusions. 2. Stable bandlike opacity within the right mid and right upper lung. 3. No pneumothorax. Electronically Signed   By: Kerby Moors M.D.   On: 01/04/2021 18:31   DG Chest Port 1 View  Result Date: 01/04/2021 CLINICAL DATA:  Pneumothorax, rib fractures EXAM: PORTABLE CHEST 1 VIEW COMPARISON:  Radiograph  01/03/2021, CT 01/02/2021 FINDINGS: Stable enlarged cardiac silhouette. Layering bilateral effusions and fluid tracking along the fissures, increasing from comparison exam. Diffuse pulmonary vascular congestion with hazy opacities in the perihilar lower lung predominance. Lower thoracic postsurgical changes are unchanged from prior. Additional multilevel degenerative changes in the spine shoulders. IMPRESSION: 1. Increasing pleural effusions with developing vascular congestion and  hazy opacities which are suggestive of worsening atelectasis and possible developing edema. 2. Stable cardiomegaly. 3. No visible pneumothorax. 4. Known right rib fractures are better characterized on cross-sectional imaging. Electronically Signed   By: Lovena Le M.D.   On: 01/04/2021 06:33   DG Chest Port 1 View  Result Date: 01/03/2021 CLINICAL DATA:  Acute respiratory distress EXAM: PORTABLE CHEST 1 VIEW COMPARISON:  01/03/2021 FINDINGS: Cardiac shadow is enlarged but stable. Patchy atelectatic changes are noted within both lungs which has progressed in the interval from the film earlier in the day. Postsurgical changes in the lower thoracic spine are noted. Small right-sided pleural effusion is seen. IMPRESSION: Progressive atelectasis when compare with the earlier film. Small right pleural effusion. Electronically Signed   By: Inez Catalina M.D.   On: 01/03/2021 21:28   DG CHEST PORT 1 VIEW  Result Date: 01/03/2021 CLINICAL DATA:  Rib fractures after fall in bathroom yesterday EXAM: PORTABLE CHEST 1 VIEW COMPARISON:  Chest CT 01/02/2021 FINDINGS: Low volume chest with hazy and streaky density at the bases. Cardiopericardial enlargement. No visible effusion or pneumothorax. Osseous structures are better depicted by prior CT. IMPRESSION: Extensive atelectasis that is progressed from yesterday. Electronically Signed   By: Monte Fantasia M.D.   On: 01/03/2021 07:23    Review of Systems  Unable to perform ROS: Acuity of condition   Blood pressure 128/74, pulse 93, temperature 98.2 F (36.8 C), temperature source Oral, resp. rate 19, height 5' (1.524 m), weight 86.5 kg, SpO2 100 %. Physical Exam Constitutional:      Appearance: She is obese.     Comments: Patient is alert but confused and tachypneic.  She answers questions appropriately but briefly.  She is on 6 L of nasal O2.  HENT:     Head: Normocephalic and atraumatic.  Abdominal:     General: Abdomen is flat.     Palpations: Abdomen is  soft.  Neurological:     Mental Status: She is alert. She is disoriented.     Comments: Motor strength in the upper extremities is intact to confrontational testing in the lower extremities she moves her legs to command and has approximately 4 out of 5 strength.  Ambulation was not tested.  Patient complained significantly of pain in the mid and lower lumbar spine.  She notes that this pain has been chronic.     Assessment/Plan: Destructive lesion of T12 and individual whose had previous traumatic fracture of L1 with reconstruction from T12-L2.  Plan: I discussed this with Dr. Bridgett Larsson and suggested an interventional radiology for needle biopsy of the lesion at T12.  I will follow-up after biopsy results are noted.  Blanchie Dessert Noe Pittsley 01/04/2021, 9:57 PM

## 2021-01-04 NOTE — Final Consult Note (Signed)
Consultant Final Sign-Off Note    Assessment/Final recommendations  [name] is a 70 y.o. female followed by me for R 9-10 rib fractures with trace right PTX that was resolved on repeat CXR. CXR this AM shows some worsening effusion but no PTX.   Wound care (if applicable): per primary    Diet at discharge: per primary team   Activity at discharge: per primary team   Follow-up appointment:  PCP    Pending results:  Unresulted Labs (From admission, onward)          Start     Ordered   01/04/21 0500  CBC  Daily,   R      01/03/21 1626   01/04/21 6144  Basic metabolic panel  Daily,   R      01/03/21 1626           Medication recommendations: increased scheduled robaxin, continue scheduled tylenol, PRN oxycodone, PRN duonebs for SOB/wheezing, PRN guaifenesin for phlegm, lasix for effusion    Other recommendations: pulmonary toilet, encourage IS, mobilize with therapies, wean supplemental O2 back to home dose of 3-4L    Thank you for allowing [place] to participate in the care of your patient!  Please consult [place] again if you have further needs for your patient.  Norm Parcel 01/04/2021 9:58 AM    Subjective   Patient reported some subjective increase in SOB overnight but is a little better this AM.   Objective  Vital signs in last 24 hours: Temp:  [97.9 F (36.6 C)-99 F (37.2 C)] 98.2 F (36.8 C) (04/22 0800) Pulse Rate:  [62-105] 92 (04/22 0800) Resp:  [17-25] 20 (04/22 0800) BP: (103-185)/(51-162) 138/69 (04/22 0800) SpO2:  [77 %-98 %] 95 % (04/22 0800) Weight:  [86.5 kg] 86.5 kg (04/21 2316)  General: pleasant, WD, female who is laying in bed in NAD Heart: regular, rate, and rhythm Lungs: CTAB.  Respiratory effort nonlabored, on 6L via [place] Abd: soft, NT, ND, +BS, no masses, hernias, or organomegaly MS: bilaterally lower extremities in compression wraps Skin: warm and dry with no masses, lesions, or rashes Neuro: Cranial nerves 2-12 grossly intact,  sensation is normal throughout Psych: A&Ox3 with an appropriate affect.    Pertinent labs and Studies: Recent Labs    01/02/21 1548 01/03/21 0151 01/04/21 0049  WBC 12.7* 14.9* 15.0*  HGB 11.5* 11.3* 10.8*  HCT 36.9 36.3 35.4*   BMET Recent Labs    01/03/21 0151 01/04/21 0049  NA 139 138  K 4.4 4.6  CL 98 93*  CO2 36* 41*  GLUCOSE 148* 164*  BUN 17 18  CREATININE 0.55 0.67  CALCIUM 9.0 8.9   No results for input(s): LABURIN in the last 72 hours. Results for orders placed or performed during the hospital encounter of 01/02/21  Resp Panel by RT-PCR (Flu A&B, Covid) Nasopharyngeal Swab     Status: None   Collection Time: 01/02/21  9:10 PM   Specimen: Nasopharyngeal Swab; Nasopharyngeal(NP) swabs in vial transport medium  Result Value Ref Range Status   SARS Coronavirus 2 by RT PCR NEGATIVE NEGATIVE Final    Comment: (NOTE) SARS-CoV-2 target nucleic acids are NOT DETECTED.  The SARS-CoV-2 RNA is generally detectable in upper respiratory specimens during the acute phase of infection. The lowest concentration of SARS-CoV-2 viral copies this assay can detect is 138 copies/mL. A negative result does not preclude SARS-Cov-2 infection and should not be used as the sole basis for treatment or other patient management  decisions. A negative result may occur with  improper specimen collection/handling, submission of specimen other than nasopharyngeal swab, presence of viral mutation(s) within the areas targeted by this assay, and inadequate number of viral copies(<138 copies/mL). A negative result must be combined with clinical observations, patient history, and epidemiological information. The expected result is Negative.  Fact Sheet for Patients:  EntrepreneurPulse.com.au  Fact Sheet for Healthcare Providers:  IncredibleEmployment.be  This test is no t yet approved or cleared by the [place] FDA and  has been authorized for  detection and/or diagnosis of SARS-CoV-2 by FDA under an Emergency Use Authorization (EUA). This EUA will remain  in effect (meaning this test can be used) for the duration of the COVID-19 declaration under Section 564(b)(1) of the Act, 21 U.S.C.section 360bbb-3(b)(1), unless the authorization is terminated  or revoked sooner.       Influenza A by PCR NEGATIVE NEGATIVE Final   Influenza B by PCR NEGATIVE NEGATIVE Final    Comment: (NOTE) The Xpert Xpress SARS-CoV-2/FLU/RSV plus assay is intended as an aid in the diagnosis of influenza from Nasopharyngeal swab specimens and should not be used as a sole basis for treatment. Nasal washings and aspirates are unacceptable for Xpert Xpress SARS-CoV-2/FLU/RSV testing.  Fact Sheet for Patients: EntrepreneurPulse.com.au  Fact Sheet for Healthcare Providers: IncredibleEmployment.be  This test is not yet approved or cleared by the [place] FDA and has been authorized for detection and/or diagnosis of SARS-CoV-2 by FDA under an Emergency Use Authorization (EUA). This EUA will remain in effect (meaning this test can be used) for the duration of the COVID-19 declaration under Section 564(b)(1) of the Act, 21 U.S.C. section 360bbb-3(b)(1), unless the authorization is terminated or revoked.  Performed at [facility], [place] [address], [place], [place] [address]     Imaging: MR THORACIC SPINE WO CONTRAST  Result Date: 01/03/2021 CLINICAL DATA:  [place].  Back pain. EXAM: MRI THORACIC SPINE WITHOUT CONTRAST TECHNIQUE: Multiplanar, multisequence MR imaging of the thoracic spine was performed. No intravenous contrast was administered. COMPARISON:  CT scan 01/02/2021 FINDINGS: Examination is limited by patient motion and the patient could not complete the examination. Alignment: Exaggerated thoracic kyphosis. The thoracic vertebral bodies are grossly normally aligned. Vertebrae: As demonstrated on the CT  scan the T12 vertebral body is partially destroyed. There is abnormal low T1 and high T2 and STIR signal intensity suspicious for tumor. No spinal canal compromise. I do not see any similar findings involving the other vertebral bodies. Remote compression fractures are noted in the thoracic spine and there are T4 and T8 hemangioma is noted. Cord:  Grossly normal thoracic spinal cord. Paraspinal and other soft tissues: No significant paraspinal findings. Disc levels: No obvious large thoracic disc protrusions or significant canal compromise. IMPRESSION: 1. Limited examination due to patient motion and the patient could not complete the examination. 2. The T12 vertebral body is partially destroyed and demonstrates abnormal signal intensity suspicious for tumor. No spinal canal compromise. 3. Remote compression fractures of the thoracic spine. 4. No obvious large thoracic disc protrusions or significant canal compromise. Electronically Signed   By: Marijo Sanes M.D.   On: 01/03/2021 17:32   DG Chest Port 1 View  Result Date: 01/04/2021 CLINICAL DATA:  Pneumothorax, rib fractures EXAM: PORTABLE CHEST 1 VIEW COMPARISON:  Radiograph 01/03/2021, CT 01/02/2021 FINDINGS: Stable enlarged cardiac silhouette. Layering bilateral effusions and fluid tracking along the fissures, increasing from comparison exam. Diffuse pulmonary vascular congestion with hazy opacities in the perihilar lower  lung predominance. Lower thoracic postsurgical changes are unchanged from prior. Additional multilevel degenerative changes in the spine shoulders. IMPRESSION: 1. Increasing pleural effusions with developing vascular congestion and hazy opacities which are suggestive of worsening atelectasis and possible developing edema. 2. Stable cardiomegaly. 3. No visible pneumothorax. 4. Known right rib fractures are better characterized on cross-sectional imaging. Electronically Signed   By: Lovena Le M.D.   On: 01/04/2021 06:33   DG Chest Port  1 View  Result Date: 01/03/2021 CLINICAL DATA:  Acute respiratory distress EXAM: PORTABLE CHEST 1 VIEW COMPARISON:  01/03/2021 FINDINGS: Cardiac shadow is enlarged but stable. Patchy atelectatic changes are noted within both lungs which has progressed in the interval from the film earlier in the day. Postsurgical changes in the lower thoracic spine are noted. Small right-sided pleural effusion is seen. IMPRESSION: Progressive atelectasis when compare with the earlier film. Small right pleural effusion. Electronically Signed   By: Inez Catalina M.D.   On: 01/03/2021 21:28

## 2021-01-04 NOTE — Progress Notes (Addendum)
Approximately around 1557, pt appeared confused, unable to follow command, 02 sat unstable from 77s-85s at 15L. Called RRT, RT, and MD. biPAP put on by RT. New order received by MD.   Lavenia Atlas, RN

## 2021-01-04 NOTE — Progress Notes (Signed)
Pt's 02 sat decreased to 80s at 6L HFNC. Increased to 15 HFNC, pt's 02 sat 92-94%. Called RT for recommending any treatment for the pt. Will continue to monitor the pt.  Lavenia Atlas, RN

## 2021-01-04 NOTE — Consult Note (Signed)
NAME:  [name], MRN:  [id number], DOB:  10/17/1950, LOS: 0 ADMISSION DATE:  01/02/2021, CONSULTATION DATE:  01/04/21 REFERRING MD: Graciella Freer CHIEF COMPLAINT:  Encephalopathy   History of Present Illness:  70 year old with chronic hypoxemic and hypercarbic respiratory failure in setting of presumed OHS/OSA who was admitted as a trauma 4/20 after fall with rib fractures.  H&P reviewed.  Daily progress note reviewed.  Patient mechanical fall at home.  She fell, backwards, hit the side of the bathtub with her right chest.  Had pain there.  Went to ED.  Chest x-ray on my interpretation showed rib fractures on the right, no pneumothorax appreciated.  CT chest was obtained as part of trauma protocol which showed a tiny anterior pneumothorax.  She was moved to trauma service.  Eventually transferred to the internal medicine teaching service.  Review of labs indicate stable chemistries with elevated bicarb as high as 41.  CBC is reviewed with mild leukocytosis, stable anemia, relatively stable thrombocytopenia.  Of note she has a history of ITP.  On the afternoon of 4/22 she became progressively encephalopathic.  ABG was obtained which revealed a pH of 718 with a PCO2 of 114.  She was placed on BiPAP. Mental status improved.  Pertinent  Medical History  OHS/OSA  Significant Hospital Events: Including procedures, antibiotic start and stop dates in addition to other pertinent events   . 4/20 admitted . 4/22, BiPAP improved MS after PCO2 was 114  Interim History / Subjective:  As above  Objective   Blood pressure (!) 118/53, pulse 89, temperature 97.9 F (36.6 C), temperature source Axillary, resp. rate 15, height 5' (1.524 m), weight 86.5 kg, SpO2 100 %.    FiO2 (%):  [75 %-100 %] 100 %   Intake/Output Summary (Last 24 hours) at 01/04/2021 1851 Last data filed at 01/04/2021 1108 Gross per 24 hour  Intake --  Output 1350 ml  Net -1350 ml   Filed Weights   01/02/21 1550 01/03/21 2316   Weight: 89.6 kg 86.5 kg    Examination: General: Obese, BiPAP in place Eyes: No scleral icterus, EOMI Neck: Supple, habitus precludes JVP evaluation Lungs: Distant sounds, shallow breaths Cardiovascular: Regular rate and rhythm, no murmurs Abdomen: ND, BS present MSK: no synovitis, no joint effusion Neuro: no focal deficits, sensation intact Psych: Normal mood, full affect  Labs/imaging that I have personally reviewed   CT chest, CXR, ABG, Chemistries, CBC  Resolved Hospital Problem list   n/a  Assessment & Plan:  Acute on chronic hypoxemic and hypercarbic respiratory failure: In setting of fall, rib fractures, splinting, hypoventilation. Mental status improved with BiPAP. --Continue BiPAP for now --Opioids d/c'd - tough situation but would avoid respiratory suppression given previously obtunded due to hypercarbia --Scheduled tyelenol 1g TID  Best practice (right click and "Reselect all SmartList Selections" daily)  Per primary  Labs   CBC: Recent Labs  Lab 01/02/21 1548 01/03/21 0151 01/04/21 0049  WBC 12.7* 14.9* 15.0*  NEUTROABS 9.8*  --  12.9*  HGB 11.5* 11.3* 10.8*  HCT 36.9 36.3 35.4*  MCV 90.4 89.9 92.2  PLT 74* 75* 65*    Basic Metabolic Panel: Recent Labs  Lab 01/02/21 1548 01/03/21 0151 01/04/21 0049  NA 141 139 138  K 4.0 4.4 4.6  CL 95* 98 93*  CO2 37* 36* 41*  GLUCOSE 139* 148* 164*  BUN 20 17 18   CREATININE 0.61 0.55 0.67  CALCIUM 8.6* 9.0 8.9   GFR: Estimated Creatinine Clearance: 65.8 mL/min (  by C-G formula based on SCr of 0.67 mg/dL). Recent Labs  Lab 01/02/21 1548 01/03/21 0151 01/04/21 0049  PROCALCITON  --   --  <0.10  WBC 12.7* 14.9* 15.0*    Liver Function Tests: Recent Labs  Lab 01/04/21 0049  AST 23  ALT 36  ALKPHOS 90  BILITOT 0.8  PROT 6.2*  ALBUMIN 3.5   No results for input(s): LIPASE, AMYLASE in the last 168 hours. No results for input(s): AMMONIA in the last 168 hours.  ABG    Component Value  Date/Time   PHART 7.176 (LL) 01/04/2021 1650   PCO2ART 114 (HH) 01/04/2021 1650   PO2ART 97.9 01/04/2021 1650   HCO3 40.9 (H) 01/04/2021 1650   TCO2 37 (H) 06/30/2020 0952   O2SAT 97.1 01/04/2021 1650     Coagulation Profile: No results for input(s): INR, PROTIME in the last 168 hours.  Cardiac Enzymes: No results for input(s): CKTOTAL, CKMB, CKMBINDEX, TROPONINI in the last 168 hours.  HbA1C: Hgb A1c MFr Bld  Date/Time Value Ref Range Status  04/19/2020 03:41 AM 6.1 (H) 4.8 - 5.6 % Final    Comment:    (NOTE) Pre diabetes:          5.7%-6.4%  Diabetes:              >6.4%  Glycemic control for   <7.0% adults with diabetes     CBG: Recent Labs  Lab 01/04/21 1633  GLUCAP 164*    Review of Systems:   Unable to obtain to to patient factors  Past Medical History:  She,  has a past medical history of Arthritis, Back pain (09/29/2013), Blood dyscrasia, Chest pain, Chronic kidney disease, Depression, Dysrhythmia, Fracture of vertebra, compression ([place]), GERD (gastroesophageal reflux disease), History of kidney stones, HTN (hypertension), ITP (idiopathic thrombocytopenic purpura) (08/29/2013), Leukocytosis, unspecified (09/05/2013), Muscle cramp (11/16/2013), and Tachycardia.   Surgical History:   Past Surgical History:  Procedure Laterality Date  . ABDOMINAL HYSTERECTOMY    . ANTERIOR LAT LUMBAR FUSION N/A 03/28/2014   Procedure: Anterolateral decompression of L1 fracture with posterior segmental stabilization;  Surgeon: Kristeen Miss, MD;  Location: [facility];  Service: Neurosurgery;  Laterality: N/A;  Anterolateral decompression of Lumbar One fracture with posterior segmental stabilization  . CESAREAN SECTION     x2  . CHEST TUBE INSERTION Left 03/28/2014   Procedure: CHEST TUBE INSERTION;  Surgeon: Ivin Poot, MD;  Location: [facility];  Service: Thoracic;  Laterality: Left;  . INCISIONAL HERNIA REPAIR N/A 11/13/2014   Procedure: PRIMARY REPAIR INCARCERATED  INCISIONAL HERNIA WITH PLACEMENT BIOLOGIC MESH;  Surgeon: Rolm Bookbinder, MD;  Location: [facility];  Service: General;  Laterality: N/A;  . LAPAROTOMY Right 09   ovarian cyst  . TOTAL ABDOMINAL HYSTERECTOMY W/ BILATERAL SALPINGOOPHORECTOMY  97     Social History:   reports that she quit smoking about 6 years ago. Her smoking use included cigarettes. She has a 15.00 pack-year smoking history. She has never used smokeless tobacco. She reports current alcohol use. She reports that she does not use drugs.   Family History:  Her family history includes Heart attack in her mother and another family member; Heart disease in her father; Hypertension in her mother and another family member; Leukemia in her paternal uncle.   Allergies No Known Allergies   Home Medications  Prior to Admission medications   Medication Sig Start Date End Date Taking? Authorizing Provider  carvedilol (COREG) 3.125 MG tablet Take 1 tablet (3.125 mg  total) by mouth 2 (two) times daily with a meal. 10/04/20  Yes [name], MD  diphenhydramine-acetaminophen (TYLENOL PM) 25-500 MG TABS tablet Take 1 tablet by mouth at bedtime as needed (For sleep).   Yes [provider]  furosemide (LASIX) 80 MG tablet Take 1 tablet (80 mg total) by mouth See admin instructions. Take 80mg  by mouth once daily.  Take an additional 80mg  daily as needed for weight gain of 3 pounds in one week or 5 pounds in one day. 10/02/20  Yes Aslam, Loralyn Freshwater, MD  loperamide (IMODIUM) 2 MG capsule Take 1 capsule (2 mg total) by mouth as needed for diarrhea or loose stools. 05/18/20  Yes Andrew Au, MD  potassium chloride (KLOR-CON) 10 MEQ tablet TAKE 1 TABLET(10 MEQ) BY MOUTH DAILY Patient taking differently: Take 10 mEq by mouth daily. 10/04/20  Yes Aslam, Loralyn Freshwater, MD  promethazine (PHENERGAN) 12.5 MG tablet Take 12.5 mg by mouth every 6 (six) hours as needed for nausea or vomiting.   Yes [provider]  vitamin B-12 1000 MCG tablet Take 1  tablet (1,000 mcg total) by mouth daily. 10/01/20  Yes Seawell, Jaimie A, DO     Critical care time: n/a

## 2021-01-04 NOTE — Significant Event (Signed)
Rapid Response Event Note   Reason for Call :   Decreased LOC and more confused this afternoon Has been on 15L [place] since this am.  Initial Focused Assessment:  Easy to awake but falls asleep quickly and is confused Shallow breathing 136/69  HR 94  RR 26  O2 sat 90% on [place] While transitioning to Bipap O2 sats decreased to mid 80s   Interventions:  Placed on Bipap O2 sat 96% ABG  7.17  PCO2 114  PO2 97.9  Bicarb 40.9  Plan of Care:  Bipap Recheck ABG   Event Summary:   MD Notified: RN notified resident Call Time: 1612 Arrival Time: 8937 End Time: 3428  Raliegh Ip, RN

## 2021-01-04 NOTE — Progress Notes (Addendum)
Physical Therapy Treatment Patient Details Name: [name] MRN: [id number] DOB: 19-Apr-1951 Today's Date: 01/04/2021    History of Present Illness 70 y.o. female admitted on 01/02/21 under observation status after falling at home in the bathroom backwards against the bathtub striking her right posterior chest, no LOC. Workup revealed R rib fxs and trace PTX. Pt noted to be more confused during PT tx on 4/22 and RN called rapid response. Pt with significant PMH of tachycardia, HTN, vertebral compression fx, depression, lower leg wounds, CKD, back pain, and anterior lateral lumbar fusion.  She is on O2 at home 3L at baseline.    PT Comments    Pt received in supine, A&O to self only and drowsy. Assisted pt to reposition for improved pulmonary clearance/pressure relief however when pt HOB elevated >30*, pt spO2 desat to 70's on 10L HF O2 [place], RN present in room and titrated O2 to 15L O2 [place] and SpO2 80-90% on 15L HF [place]. Attempted to coach pt for IS use but pt too confused to attempt and mostly crying out due to pain. Pt given cues for pursed-lip breathing and heels floated for pressure relief. RN planning to call rapid response due to pt confusion/poor command following so deferred further mobility at this time. Pt continues to benefit from PT services to progress toward functional mobility goals. D/C recs below, pending functional progress; limited session this date.  Follow Up Recommendations  Home health PT;Supervision for mobility/OOB (max home health services, PT, OT, aide (was active with RN), may need ambulance transport home.)     Equipment Recommendations  None recommended by PT (appropriate for SNF, but pt adamantly refusing that option)    Recommendations for Other Services       Precautions / Restrictions Precautions Precautions: Fall Precaution Comments: rib fxs Other Brace: on 3L O2 [place] at home Restrictions Weight Bearing Restrictions: No    Mobility  Bed Mobility Overal  bed mobility: Needs Assistance Bed Mobility: Rolling (posterior supine scoot) Rolling: Max assist         General bed mobility comments: Pt needing maxA for repositioning with HOB elevated for better lung toileting and +2 totalA for posterior supine scoot toward [facility]    Transfers                 General transfer comment: pt desat with repositioning for comfort on 15L O2 [place] and pt too confused to follow simple commands so defer for safety  Ambulation/Gait                 Stairs             Wheelchair Mobility    Modified Rankin (Stroke Patients Only)       Balance                                            Cognition Arousal/Alertness: Lethargic Behavior During Therapy: Flat affect Overall Cognitive Status: Impaired/Different from baseline Area of Impairment: Orientation;Attention;Memory;Following commands;Safety/judgement;Awareness;Problem solving                 Orientation Level: Disoriented to;Place;Time;Situation Current Attention Level: Focused   Following Commands: Follows one step commands inconsistently;Follows one step commands with increased time     Problem Solving: Slow processing;Decreased initiation;Difficulty sequencing;Requires verbal cues;Requires tactile cues General Comments: pt able to state name/DOB but mumbling and seeming to have  difficulty articulating and RN notified of her confusion, RN reports this is a change in mental status as she was A&O before      Exercises      General Comments General comments (skin integrity, edema, etc.): Pt instructed on pursed-lip breathing and techniques to improve SpO2, pt unable to attempt using incentive spirometer due to confusion/internal distraction from pain so mostly focused on repositioning for pressure relief and improved pulmonary toileting      Pertinent Vitals/Pain Pain Assessment: Faces Faces Pain Scale: Hurts worst Pain Location: pt unable to  localize due to confusion, guarding at ribs with deep breathing and repositioning Pain Descriptors / Indicators: Grimacing;Guarding;Moaning Pain Intervention(s): Limited activity within patient's tolerance;Monitored during session;Repositioned    Home Living                      Prior Function            PT Goals (current goals can now be found in the care plan section) Acute Rehab PT Goals Patient Stated Goal: to go home PT Goal Formulation: With patient Time For Goal Achievement: 01/17/21 Potential to Achieve Goals: Good    Frequency    Min 3X/week      PT Plan Other (comment) (continue to assess, limited participation 2/2 AMS)    Co-evaluation              AM-PAC PT "6 Clicks" Mobility   Outcome Measure  Help needed turning from your back to your side while in a flat bed without using bedrails?: Total Help needed moving from lying on your back to sitting on the side of a flat bed without using bedrails?: Total Help needed moving to and from a bed to a chair (including a wheelchair)?: Total Help needed standing up from a chair using your arms (e.g., wheelchair or bedside chair)?: Total Help needed to walk in hospital room?: Total Help needed climbing 3-5 steps with a railing? : Total 6 Click Score: 6    End of Session Equipment Utilized During Treatment: Oxygen Activity Tolerance: Patient limited by pain Patient left: in bed;with call bell/phone within reach;with bed alarm set   PT Visit Diagnosis: Muscle weakness (generalized) (M62.81);Difficulty in walking, not elsewhere classified (R26.2);Pain Pain - Right/Left: Right Pain - part of body:  (posterior ribs)     Time: 1610-9604 PT Time Calculation (min) (ACUTE ONLY): 15 min  Charges:  $Therapeutic Activity: 8-22 mins                     Evelyn Voorhis P., PTA Acute Rehabilitation Services Pager: [phone] Office: [place] 01/04/2021, 5:51 PM

## 2021-01-04 NOTE — Progress Notes (Signed)
Patient did have an episode of O2 desaturation to the 50s on 15l High flow, patient did arouse and answer our questions r/t where she is and what brought her to the hospital which she answers appropriately. After deep breathing exercise, pt O2 sat went back the 90. Patient resting in bed eyes closed O2. Sat is 96 on 15l High flow. We'll continue to monitor.

## 2021-01-04 NOTE — Progress Notes (Addendum)
Page received from nursing that patient is more encephalopathic. She was on 15L of oxygen with sat in upper 90s. Respiratory therapy arrived and transitioned her to BiPAP, notes that during the transition had drop into mid 80s. On my arrival patient was somnolent but rousable with physical stimuli. Tried switching to CPAP with her known trace pneumothorax, but had very small tidal volumes so changed back to BiPAP.  ABG collected and showed pH 7.17, pCO2 114, pO2 97. BP stable. At this time feel that the benefits of BiPAP outweigh the risk. Pending CXR to evaluate her pneumothorax. While on BiPAP, patient had gradual improvement in mental status. She is protecting her airway adequately at this time. PCCM called and evaluated the patient. We will keep her on BiPAP and recheck ABG in 1 hour.   Plan: - F/u CXR - continue BiPAP - recheck ABG in 1 hour - monitor vital signs  ADDENDUM: Patient mentation is much improved with BiPAP. Ideally continue on BiPAP for rest of tonight. She is repeatedly asking for water and dinner, may swab mouth. Can take short break for dinner if she remains insistent, but try to keep on BiPAP if possible. Canceled repeat ABG given clinical improvement. Vitals remain stable.  CXR without interval worsening of pneumothorax, so BiPAP is likely safe. Will continue to monitor vitals carefully. Repeat CXR tomorrow morning.

## 2021-01-05 ENCOUNTER — Inpatient Hospital Stay (HOSPITAL_COMMUNITY): Payer: Medicare PPO

## 2021-01-05 DIAGNOSIS — R0603 Acute respiratory distress: Secondary | ICD-10-CM | POA: Diagnosis not present

## 2021-01-05 DIAGNOSIS — J942 Hemothorax: Secondary | ICD-10-CM

## 2021-01-05 DIAGNOSIS — S2241XA Multiple fractures of ribs, right side, initial encounter for closed fracture: Secondary | ICD-10-CM | POA: Diagnosis not present

## 2021-01-05 DIAGNOSIS — S270XXA Traumatic pneumothorax, initial encounter: Secondary | ICD-10-CM | POA: Diagnosis not present

## 2021-01-05 LAB — CBC
HCT: 30.4 % — ABNORMAL LOW (ref 36.0–46.0)
HCT: 26.8 % — ABNORMAL LOW (ref 36.0–46.0)
Hemoglobin: 9.4 g/dL — ABNORMAL LOW (ref 12.0–15.0)
Hemoglobin: 8.5 g/dL — ABNORMAL LOW (ref 12.0–15.0)
MCH: 28.1 pg (ref 26.0–34.0)
MCH: 28.2 pg (ref 26.0–34.0)
MCHC: 30.9 g/dL (ref 30.0–36.0)
MCHC: 31.7 g/dL (ref 30.0–36.0)
MCV: 91 fL (ref 80.0–100.0)
MCV: 89 fL (ref 80.0–100.0)
Platelets: 55 10*3/uL — ABNORMAL LOW (ref 150–400)
Platelets: 89 10*3/uL — ABNORMAL LOW (ref 150–400)
RBC: 3.34 MIL/uL — ABNORMAL LOW (ref 3.87–5.11)
RBC: 3.01 MIL/uL — ABNORMAL LOW (ref 3.87–5.11)
RDW: 14.6 % (ref 11.5–15.5)
RDW: 14.7 % (ref 11.5–15.5)
WBC: 14.4 10*3/uL — ABNORMAL HIGH (ref 4.0–10.5)
WBC: 18.3 10*3/uL — ABNORMAL HIGH (ref 4.0–10.5)
nRBC: 0 % (ref 0.0–0.2)
nRBC: 0 % (ref 0.0–0.2)

## 2021-01-05 LAB — IRON AND TIBC
Iron: 41 ug/dL (ref 28–170)
Saturation Ratios: 12 % (ref 10.4–31.8)
TIBC: 330 ug/dL (ref 250–450)
UIBC: 289 ug/dL

## 2021-01-05 LAB — BASIC METABOLIC PANEL
Anion gap: 7 (ref 5–15)
BUN: 27 mg/dL — ABNORMAL HIGH (ref 8–23)
CO2: 37 mmol/L — ABNORMAL HIGH (ref 22–32)
Calcium: 9 mg/dL (ref 8.9–10.3)
Chloride: 93 mmol/L — ABNORMAL LOW (ref 98–111)
Creatinine, Ser: 0.61 mg/dL (ref 0.44–1.00)
GFR, Estimated: >60 mL/min (ref >60)
Glucose, Bld: 124 mg/dL — ABNORMAL HIGH (ref 70–99)
Potassium: 4.7 mmol/L (ref 3.5–5.1)
Sodium: 137 mmol/L (ref 135–145)

## 2021-01-05 LAB — FERRITIN: Ferritin: 54 ng/mL (ref 11–307)

## 2021-01-05 LAB — TYPE AND SCREEN
ABO/RH(D): A POS
Antibody Screen: NEGATIVE

## 2021-01-05 LAB — VITAMIN B12: Vitamin B-12: 961 pg/mL — ABNORMAL HIGH (ref 180–914)

## 2021-01-05 MED ORDER — FUROSEMIDE 10 MG/ML IJ SOLN
80.0000 mg | Freq: Once | INTRAMUSCULAR | Status: AC
  Administered 2021-01-05: 80 mg via INTRAVENOUS
  Filled 2021-01-05: qty 8

## 2021-01-05 MED ORDER — RIVAROXABAN 10 MG PO TABS
10.0000 mg | ORAL_TABLET | Freq: Every day | ORAL | Status: DC
  Filled 2021-01-05: qty 1

## 2021-01-05 MED ORDER — HYDROMORPHONE HCL 1 MG/ML IJ SOLN
0.5000 mg | INTRAMUSCULAR | Status: DC | PRN
  Administered 2021-01-05 – 2021-01-07 (×7): 0.5 mg via INTRAVENOUS
  Filled 2021-01-05 (×7): qty 1

## 2021-01-05 MED ORDER — SODIUM CHLORIDE 0.9% IV SOLUTION
Freq: Once | INTRAVENOUS | Status: AC
  Administered 2021-01-05: 250 mL via INTRAVENOUS

## 2021-01-05 NOTE — Progress Notes (Addendum)
ANTICOAGULATION CONSULT NOTE - Initial Consult  Pharmacy Consult for rivaroxaban Indication: VTE prophylaxis  No Known Allergies  Patient Measurements: Height: 5' (152.4 cm) Weight: 86.5 kg (190 lb 11.2 oz) IBW/kg (Calculated) : 45.5  Vital Signs: Temp: 98.4 F (36.9 C) (04/23 0300) Temp Source: Oral (04/23 0300) BP: 134/61 (04/23 0300) Pulse Rate: 88 (04/23 0300)  Labs: Recent Labs    01/03/21 0151 01/04/21 0049 01/05/21 0215  HGB 11.3* 10.8* 9.4*  HCT 36.3 35.4* 30.4*  PLT 75* 65* 55*  CREATININE 0.55 0.67 0.61    Estimated Creatinine Clearance: 65.8 mL/min (by C-G formula based on SCr of 0.61 mg/dL).   Medical History: Past Medical History:  Diagnosis Date  . Arthritis    lumbar burst fx, osteoporosis  . Back pain 09/29/2013  . Blood dyscrasia   . Chest pain    denies  . Chronic kidney disease    passed stones spontaneously- 40 yrs. ago   . Depression    states her current situation with activity limitations, she has a low spirit at times.   Marland Kitchen Dysrhythmia    occ tachycardia  . Fracture of vertebra, compression ([place])   . GERD (gastroesophageal reflux disease)    occ  . History of kidney stones   . HTN (hypertension)   . ITP (idiopathic thrombocytopenic purpura) 08/29/2013  . Leukocytosis, unspecified 09/05/2013  . Muscle cramp 11/16/2013  . Tachycardia    occ upon exersion, and when put on bp med    Medications:  Scheduled:  . acetaminophen  1,000 mg Oral TID  . carvedilol  3.125 mg Oral BID WC  . furosemide  80 mg Intravenous Once  . lidocaine  1 patch Transdermal Q24H  . methocarbamol  750 mg Oral TID  . sodium chloride flush  3 mL Intravenous Q12H  . cyanocobalamin  1,000 mcg Oral Daily    Assessment: Pharmacy consulted for rivaroxaban DVT ppx. Patient is chronically thrombocytopenic with BL plt from 40-50. Today platelets are 55, and hemoglobin is downtrending slightly in the setting of recent traumatic injury. Patient's possible oncologic  process and recent injury both put her at higher risk of DVT and carry a bleeding risk. SCDs are not possible due to unna boots in place.   Goal of Therapy:  Prevent DVT formation while inpatient Monitor platelets by anticoagulation protocol: Yes   Plan:  Start rivaroxaban 10 mg once daily for DVT prophylaxis while inpatient  Monitor CBCs and signs of bleeding  Norina Buzzard, PharmD PGY1 Pharmacy Resident 01/05/2021 8:07 AM   ADDENDUM:  Consult canceled in light of planned thoracentesis

## 2021-01-05 NOTE — Progress Notes (Signed)
Bedside thoracentesis performed on this patient.  Appropriate paperwork filled out and [facility] notified of procedure.  Patient tolerated procedure relatively well.  800 cc of bloody fluid removed.  Patient taken off of bipap to 4L [place].  As per CCM, O2 saturation should be 88 or higher.

## 2021-01-05 NOTE — Procedures (Signed)
Thoracentesis  Procedure Note  [name]  [id number]  1951/07/20  Date:01/05/21  Time:10:33 AM   Provider Performing:Sadik Piascik C Tamala Julian   Procedure: Thoracentesis with imaging guidance (02334)  Indication(s) Pleural Effusion  Consent Risks of the procedure as well as the alternatives and risks of each were explained to the patient and/or caregiver.  Consent for the procedure was obtained and is signed in the bedside chart  Anesthesia Topical only with 1% lidocaine    Time Out Verified patient identification, verified procedure, site/side was marked, verified correct patient position, special equipment/implants available, medications/allergies/relevant history reviewed, required imaging and test results available.   Sterile Technique Maximal sterile technique including full sterile barrier drape, hand hygiene, sterile gown, sterile gloves, mask, hair covering, sterile ultrasound probe cover (if used).  Procedure Description Ultrasound was used to identify appropriate pleural anatomy for placement and overlying skin marked.  Area of drainage cleaned and draped in sterile fashion. Lidocaine was used to anesthetize the skin and subcutaneous tissue.  800 cc's of frank blood was drained from the right pleural space. Catheter then removed and bandaid applied to site.   Complications/Tolerance None; patient tolerated the procedure well. Chest X-ray is ordered to confirm no post-procedural complication.   EBL Minimal   Specimen(s) None

## 2021-01-05 NOTE — Progress Notes (Signed)
Patient remains on BiPap at this time.  Is anxious, but orented x3.  Port CXR revealed large pleural effusion.  VSS at this time.  PCCM to consult.  Primary doctors in attendance at bedside.  Rapid response aware of potential for thoracentesis at bedside.

## 2021-01-05 NOTE — Progress Notes (Signed)
NAME:  [name], MRN:  [id number], DOB:  12-27-1950, LOS: 1 ADMISSION DATE:  01/02/2021, CONSULTATION DATE:  01/05/21 REFERRING MD: Graciella Freer CHIEF COMPLAINT:  Encephalopathy   History of Present Illness:  70 year old with chronic hypoxemic and hypercarbic respiratory failure in setting of presumed OHS/OSA who was admitted as a trauma 4/20 after fall with rib fractures.  H&P reviewed.  Daily progress note reviewed.  Patient mechanical fall at home.  She fell, backwards, hit the side of the bathtub with her right chest.  Had pain there.  Went to ED.  Chest x-ray on my interpretation showed rib fractures on the right, no pneumothorax appreciated.  CT chest was obtained as part of trauma protocol which showed a tiny anterior pneumothorax.  She was moved to trauma service.  Eventually transferred to the internal medicine teaching service.  Review of labs indicate stable chemistries with elevated bicarb as high as 41.  CBC is reviewed with mild leukocytosis, stable anemia, relatively stable thrombocytopenia.  Of note she has a history of ITP.  On the afternoon of 4/22 she became progressively encephalopathic.  ABG was obtained which revealed a pH of 718 with a PCO2 of 114.  She was placed on BiPAP. Mental status improved.  Pertinent  Medical History  OHS/OSA  Significant Hospital Events: Including procedures, antibiotic start and stop dates in addition to other pertinent events   . 4/20 admitted . 4/22, BiPAP improved MS after PCO2 was 114  Interim History / Subjective:  Increased R chest pain this AM, CXR with enlarging effusion on R. Awake, c/o SOB and R chest pain.  Objective   Blood pressure 137/61, pulse 88, temperature 98.4 F (36.9 C), temperature source Oral, resp. rate (!) 24, height 5' (1.524 m), weight 86.5 kg, SpO2 99 %.    FiO2 (%):  [75 %-100 %] 80 %   Intake/Output Summary (Last 24 hours) at 01/05/2021 1026 Last data filed at 01/05/2021 0800 Gross per 24 hour  Intake 0 ml   Output 1250 ml  Net -1250 ml   Filed Weights   01/02/21 1550 01/03/21 2316  Weight: 89.6 kg 86.5 kg    Examination: Constitutional: chronically ill woman on BIPAP  Eyes: EOMI, pupils equal Ears, nose, mouth, and throat: BIPAP in place with good seal Cardiovascular: RRR, ext warm Respiratory: tachypneic, diminished breath sounds on R, [place] with poorly visualized R effusion suspect related to concurrent PTX on that side Gastrointestinal: Soft, +BS Skin: bruising along R flank Neurologic: Profoundly deconditioned, moves ext to command Psychiatric: anxious   Labs/imaging that I have personally reviewed   CXR enlarging R effusion H/H, plts dropping [facility] Problem list   n/a  Assessment & Plan:  Acute on chronic hypoxemic and hypercarbic respiratory failure: In setting of fall, rib fractures, splinting, hypoventilation. Mental status improved with BiPAP. Now complicated with dropping H/H, enlarging R effusion question inflammatory vs. Hemothorax Hx ITP, class 2 obesity, profound deconditioning - Thoracentesis to start, if confirms blood will try to drain as much as I can via thora and give platelets - If continues to be an issue will need ICU transfer, intubation and tube thoracostomy, TCTS eval but very poor surgical candidate - Primary to update family  Best practice (right click and "Reselect all SmartList Selections" daily)  Per primary   Patient critically ill due to respiratory failure Interventions to address this today BIPAP titration, thoracentesis Risk of deterioration without these interventions is high  I personally spent 34 minutes  providing critical care not including any separately billable procedures  Erskine Emery MD [facility]  Prefer epic messenger for cross cover needs If after hours, please call E-link

## 2021-01-05 NOTE — Progress Notes (Signed)
Bedside thoracentesis performed by Dr. Tamala Julian with Enos Fling, RN (RR team) also in attendance.  Prior to procedure, patient required Bipap and was dyspneic at rest, having shallow respirations and a CXR revealing large pulmonary effusion.  Approximately 800 ml frank blood drained from right pleural space, as indicated by Dr. Tamala Julian.  Patient states felt "better".  Was given Dilaudid for discomfort with good effects.  Patient currently on 4 L/M high flow O2.  Will monitor carefully for increased dyspnea, changes in vital signs (with poor saturations), etc.

## 2021-01-05 NOTE — Progress Notes (Signed)
Interventional Radiology Brief Note:  IR consulted for T12 bone lesion biopsy for possible tumor vs. Infection.  Patient with tenuous medical status overnight and today.  AMS yesterday, acidosis, on Bipap overnight.  S/p thoracentesis this AM with removal of 800 mL frank blood after traumatic fall at home.   IR will continue to follow for formal consult and possible procedure, likely early next week, based on medical status and ability to tolerate sedation for procedure.   Brynda Greathouse, MS RD PA-C 11:18 AM

## 2021-01-05 NOTE — Progress Notes (Addendum)
Subjective:   Patient tolerated BiPAP mask adequately overnight, but notes it is uncomfortable and she wants to eat/drink. When she was trialed off of it, she reports have difficulty breathing, thinks partially due to rib pain. No other new complaints.  Patient received thoracentesis this morning with drainage of 800 cc of blood, see procedure documentation for more details.  Objective:  Vital signs in last 24 hours: Vitals:   01/04/21 2125 01/05/21 0000 01/05/21 0135 01/05/21 0300  BP:  130/68  134/61  Pulse: 93 86 92 88  Resp: 19 15 16 16   Temp:  98.2 F (36.8 C)  98.4 F (36.9 C)  TempSrc:  Oral  Oral  SpO2: 100% 97% 97% 98%  Weight:      Height:       Physical Exam Vitals and nursing note reviewed.  Constitutional: on BiPAP, appears uncomfortartable Head: atraumatic Cardiovascular: regular rate and rhythm, normal heart sounds Pulmonary: effort normal, diminished breath sounds, coarse breath sounds worse on the right, limited by habitus and positioning with BiPAP Musculoskeletal: pain over right ribs Skin: warm and dry, abrasion on chin reportedly from cervical collar, bilateral lower extremities with unna boots on Neurological: alert, no focal deficit Psychiatric: anxious  Assessment/Plan:  Ms. Accardi is a 70 y/o F with hx of chronic respiratory failure on 3-4L chronically, morbid obesity, HFpEF, chronic ITP, chronic leg wounds who presents with rib fractures after mechanical fall. Otherwise medically stable.  Principal Problem:   Pneumothorax Active Problems:   Multiple closed fractures of ribs of right side   Fall against object   Acute respiratory distress   Rib fractures  Acute on chronic hypercapneic and hypoxic respiratory failure Traumatic hemothorax in the setting of chronic ITP Ribs fracture due to mechanical fall Mechanical fall in bathroom, struck posterior ribs against bath tub.  Has been having worsening hypercapnic respiratory failure.  She  requires 3 to 4 L of oxygen at baseline.  Yesterday afternoon had worsening encephalopathy, PCO2 found to be 114 on ABG so has been on BiPAP since that time.  Encephalopathy has improved.  Very small tidal volumes when off of BiPAP.  Thoracentesis performed this morning with removal of 800 cc of frank blood.  X-ray afterwards was somewhat improved, suspicious for small pneumo mediastinum.  - Continue BiPAP for now - Repeat chest x-ray and CBC this afternoon -Multimodal pain control with  Tylenol, Lidoderm patch, cold compress.  Discontinue opioids. -Pulm toilet, incentive spirometer if she comes off BiPAP -spO2 goal of 88-92%   T 12 destructive change suspicious for tumor MRI with change at T12 concerning for malignant or infectious etiology.  No clear primary at this point. Has never had a mammogram, breast exam without overt abnormality. 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   - Continue home medications of Coreg, and Klor-Con - Lasix 80mg  IV today - O2 as above - CPAP at night when appropriate  Chronic Venous Stasis -- Replace Unna wrappings today   Chronic ITP - Daily CBC - Hold off on VTE prophylaxis with this and her pneumothorax   VTE prophylaxis: holding off with her thrombocytopenia Prior to Admission Living Arrangement: home Anticipated Discharge Location: pending Barriers to Discharge: medical workup, pain control Dispo: Anticipated discharge in  approximately 3-5 day(s).   Andrew Au, MD 01/05/2021, 6:32 AM Pager: [phone] After 5pm on weekdays and 1pm on weekends: On Call pager [phone]

## 2021-01-06 ENCOUNTER — Inpatient Hospital Stay (HOSPITAL_COMMUNITY): Payer: Medicare PPO

## 2021-01-06 ENCOUNTER — Encounter (HOSPITAL_COMMUNITY): Payer: Self-pay | Admitting: Internal Medicine

## 2021-01-06 DIAGNOSIS — J942 Hemothorax: Secondary | ICD-10-CM | POA: Diagnosis not present

## 2021-01-06 DIAGNOSIS — R0603 Acute respiratory distress: Secondary | ICD-10-CM | POA: Diagnosis not present

## 2021-01-06 DIAGNOSIS — S270XXA Traumatic pneumothorax, initial encounter: Secondary | ICD-10-CM | POA: Diagnosis not present

## 2021-01-06 LAB — CBC
HCT: 25.9 % — ABNORMAL LOW (ref 36.0–46.0)
HCT: 26.6 % — ABNORMAL LOW (ref 36.0–46.0)
Hemoglobin: 8.2 g/dL — ABNORMAL LOW (ref 12.0–15.0)
Hemoglobin: 8.5 g/dL — ABNORMAL LOW (ref 12.0–15.0)
MCH: 28.1 pg (ref 26.0–34.0)
MCH: 28 pg (ref 26.0–34.0)
MCHC: 31.7 g/dL (ref 30.0–36.0)
MCHC: 32 g/dL (ref 30.0–36.0)
MCV: 88.7 fL (ref 80.0–100.0)
MCV: 87.5 fL (ref 80.0–100.0)
Platelets: 87 10*3/uL — ABNORMAL LOW (ref 150–400)
Platelets: 111 10*3/uL — ABNORMAL LOW (ref 150–400)
RBC: 2.92 MIL/uL — ABNORMAL LOW (ref 3.87–5.11)
RBC: 3.04 MIL/uL — ABNORMAL LOW (ref 3.87–5.11)
RDW: 14.6 % (ref 11.5–15.5)
RDW: 14.6 % (ref 11.5–15.5)
WBC: 13.8 10*3/uL — ABNORMAL HIGH (ref 4.0–10.5)
WBC: 16.7 10*3/uL — ABNORMAL HIGH (ref 4.0–10.5)
nRBC: 0 % (ref 0.0–0.2)
nRBC: 0.1 % (ref 0.0–0.2)

## 2021-01-06 LAB — PREPARE PLATELET PHERESIS: Unit division: 0

## 2021-01-06 LAB — BASIC METABOLIC PANEL
Anion gap: 7 (ref 5–15)
BUN: 33 mg/dL — ABNORMAL HIGH (ref 8–23)
CO2: 40 mmol/L — ABNORMAL HIGH (ref 22–32)
Calcium: 9 mg/dL (ref 8.9–10.3)
Chloride: 90 mmol/L — ABNORMAL LOW (ref 98–111)
Creatinine, Ser: 0.5 mg/dL (ref 0.44–1.00)
GFR, Estimated: >60 mL/min (ref >60)
Glucose, Bld: 108 mg/dL — ABNORMAL HIGH (ref 70–99)
Potassium: 4.3 mmol/L (ref 3.5–5.1)
Sodium: 137 mmol/L (ref 135–145)

## 2021-01-06 LAB — BPAM PLATELET PHERESIS
Blood Product Expiration Date: 202204252359
ISSUE DATE / TIME: 202204231150
Unit Type and Rh: 6200

## 2021-01-06 MED ORDER — FUROSEMIDE 80 MG PO TABS
80.0000 mg | ORAL_TABLET | Freq: Every day | ORAL | Status: DC
  Filled 2021-01-06: qty 1

## 2021-01-06 MED ORDER — FUROSEMIDE 10 MG/ML IJ SOLN
40.0000 mg | Freq: Once | INTRAMUSCULAR | Status: AC
  Administered 2021-01-06: 40 mg via INTRAVENOUS
  Filled 2021-01-06: qty 4

## 2021-01-06 NOTE — Progress Notes (Signed)
NAME:  [name], MRN:  [id number], DOB:  Nov 20, 1950, LOS: 2 ADMISSION DATE:  01/02/2021, CONSULTATION DATE:  01/06/21 REFERRING MD: Graciella Freer CHIEF COMPLAINT:  Encephalopathy   History of Present Illness:  70 year old with chronic hypoxemic and hypercarbic respiratory failure in setting of presumed OHS/OSA who was admitted as a trauma 4/20 after fall with rib fractures.  H&P reviewed.  Daily progress note reviewed.  Patient mechanical fall at home.  She fell, backwards, hit the side of the bathtub with her right chest.  Had pain there.  Went to ED.  Chest x-ray on my interpretation showed rib fractures on the right, no pneumothorax appreciated.  CT chest was obtained as part of trauma protocol which showed a tiny anterior pneumothorax.  She was moved to trauma service.  Eventually transferred to the internal medicine teaching service.  Review of labs indicate stable chemistries with elevated bicarb as high as 41.  CBC is reviewed with mild leukocytosis, stable anemia, relatively stable thrombocytopenia.  Of note she has a history of ITP.  On the afternoon of 4/22 she became progressively encephalopathic.  ABG was obtained which revealed a pH of 718 with a PCO2 of 114.  She was placed on BiPAP. Mental status improved.  Pertinent  Medical History  OHS/OSA  Significant Hospital Events: Including procedures, antibiotic start and stop dates in addition to other pertinent events   . 4/20 admitted . 4/22, BiPAP improved MS after PCO2 was 114 . 4/23, worsened effusion, ~800cc dark blood from R chest thora, very difficult to position patient due to habitus and pain  Interim History / Subjective:  Breathing a bit better after thora. H/H stable overnight. Continues to have pain with deep inspiration. Breathes better with BIPAP.  Objective   Blood pressure (!) 145/75, pulse (!) 109, temperature 98.1 F (36.7 C), temperature source Axillary, resp. rate 19, height 5' (1.524 m), weight 86.5 kg, SpO2  93 %.    FiO2 (%):  [100 %] 100 %   Intake/Output Summary (Last 24 hours) at 01/06/2021 1515 Last data filed at 01/06/2021 0850 Gross per 24 hour  Intake 180 ml  Output 800 ml  Net -620 ml   Filed Weights   01/02/21 1550 01/03/21 2316  Weight: 89.6 kg 86.5 kg    Examination: Constitutional: chronically ill woman on BIPAP  Eyes: EOMI, pupils equal Ears, nose, mouth, and throat: BIPAP in place with good seal Cardiovascular: RRR, ext warm Respiratory: diminished at bases, moreso on R, +crackles, no accessory muscle use Gastrointestinal: Soft, +BS Skin: bruising along R flank stable Neurologic: Profoundly deconditioned, moves ext to command Psychiatric: less anxious Ext: chronic lymphedema but improving   Labs/imaging that I have personally reviewed   CXR stable smaller R effusion Ongoing hypoventilation H/H stable Plts responded well to transfusion 4/23  Resolved Hospital Problem list   n/a  Assessment & Plan:  # Acute on chronic hypoxemic and hypercarbic respiratory failure: In setting of fall, rib fractures, splinting, hypoventilation. Mental status improved with BiPAP. # Complicated by delayed onset R hemothorax post partial evacuation with thoracentesis 4/23.  Received 1 unit plts 4/23 to reduce chance of enlarging. # Hx ITP, class 2 obesity, OHS, profound deconditioning # T12 lesion question tumor vs. Infection- CT directed biopsy pending  - Agree with diuresis and trying to wean from BIPAP as you are doing - Encourage IS - Attempting to more thoroughly address residual right hemothorax will likely land her on vent and with her degree of deconditioning and OHS  she would have high chance of ending up with tracheostomy.  She would like to avoid this if possible. - Will follow with you, remain tenuous  Best practice (right click and "Reselect all SmartList Selections" daily)  Per primary    Erskine Emery MD [facility]  Prefer epic messenger for  cross cover needs If after hours, please call E-link

## 2021-01-06 NOTE — Progress Notes (Signed)
Patient saturation 77% on 4L HFNC. Increased to 8L at 80%. Notified RT. Now saturation 99% on BiPap. MD notified.  Daymon Larsen, RN

## 2021-01-06 NOTE — Progress Notes (Signed)
Subjective:   No overnight events.   This morning, patient is resting comfortably on BiPAP.  She appears sleepy but awakes to voice and is able to answer questions appropriately.  She denies any acute complaints at this time.  Objective:  Vital signs in last 24 hours: Vitals:   01/05/21 2322 01/06/21 0100 01/06/21 0345 01/06/21 0357  BP: (!) 141/65 135/63 (!) 142/56   Pulse: 84 85 81 82  Resp: 15 14 15 14   Temp: 98.2 F (36.8 C) 98.2 F (36.8 C) 98.3 F (36.8 C)   TempSrc: Axillary Axillary Axillary   SpO2: 100% 100% 100% 100%  Weight:      Height:       Physical Exam Vitals and nursing note reviewed.  Constitutional: on BiPAP, appears comfortartable Head: atraumatic Cardiovascular: regular rate and rhythm, normal heart sounds Pulmonary: effort normal, diminished breath sounds in the bases, resident breath sounds on anterior examination, tidal volume approximately 250-300 on BiPAP, saturating at 100% on 70% FiO2 Musculoskeletal: pain over right ribs Skin: warm and dry, abrasion on chin reportedly from cervical collar, bilateral lower extremities with unna boots on Neurological: alert, no focal deficit  Assessment/Plan:  [name] is a 70 y/o F with hx of chronic respiratory failure on 3-4L chronically, morbid obesity, HFpEF, chronic ITP, chronic leg wounds who presents with rib fractures after mechanical fall.   Principal Problem:   Pneumothorax Active Problems:   Multiple closed fractures of ribs of right side   Fall against object   Acute respiratory distress   Rib fractures   Hemothorax on right  Acute on chronic hypercapneic and hypoxic respiratory failure Traumatic hemothorax in the setting of chronic ITP Ribs fracture due to mechanical fall Mechanical fall in bathroom with right 9th and 10th rib fractures and small pneumothorax on admission.  Home oxygen requirement of 3 to 4 L of oxygen at baseline.  Clinical course has been complicated by worsening  hypercarbia and development of hemothorax with 800 cc drained on 4/23.  Today, chest x-ray looks slightly improved compared to yesterday's.  No evidence of encephalopathy on exam  - PCCM following; appreciate their recommendations - Continue BiPAP whenever sleeping - Chest x-ray and ABG if worsening respiratory status develops - If hemothorax re-accumulates, will likely need transfer to ICU for chest tube and intubation - Multimodal pain control with Tylenol, Lidoderm patch, cold compress, low dose Dilaudid - Pulm toilet, incentive spirometer when off BiPAP - spO2 goal of 88-92%   T12 destructive change suspicious for tumor MRI with change at T12 concerning for malignant or infectious etiology.  No clear primary at this point. Has never had a mammogram, breast exam without overt abnormality. 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  - Neurosurgery consulted - IR planning for biopsy early next week - outpatient mammogram, colonoscopy   Chronic HFpEF OSA - Continue home medications of Coreg, and Klor-Con - Resume home Lasix 80mg  today  - O2 as above - BiPAP at night   Chronic Venous Stasis -- Replace Unna wrappings today   Chronic ITP - Daily CBC - Hold off on VTE prophylaxis with this and her hemothorax  VTE prophylaxis: holding off with her thrombocytopenia Prior to Admission Living Arrangement: home Anticipated Discharge Location: pending Barriers to Discharge: medical workup, pain control  Dispo: Anticipated discharge in approximately 3-5 day(s).   Dr. Jose Persia Internal Medicine PGY-2  Pager: [phone] After 5pm on  weekdays and 1pm on weekends: On Call pager [phone]  01/06/2021, 6:49 AM

## 2021-01-06 NOTE — Consult Note (Signed)
Chief Complaint: Patient was seen in consultation today for T12 lesion  Referring Physician(s): Dr. Daryll Drown  Supervising Physician: Arne Cleveland  Patient Status: [facility] - In-pt  History of Present Illness: [name] is a 70 y.o. female with history of chronic respiratory failure on 3-4L at home, CHF, chronic ITP, chronic leg wounds who presents with rib fractures after a fall at home.  Since admission she was found to have a right hemothorax s/p bedside thoracentesis by CCM.  Imaging also revealed a new T12 lesion.  IR consulted for biopsy. Case reviewed and approved by Dr. Vernard Gambles.   Patient assessed at bedside this afternoon.  She unfortunately was not able to maintain O2 sats on [place] despite thoracentesis and has been placed back on bipap.  She appears alert and nods to questions, but is slow to respond.   Past Medical History:  Diagnosis Date  . Arthritis    lumbar burst fx, osteoporosis  . Back pain 09/29/2013  . Blood dyscrasia   . Chest pain    denies  . Chronic kidney disease    passed stones spontaneously- 40 yrs. ago   . Depression    states her current situation with activity limitations, she has a low spirit at times.   Marland Kitchen Dysrhythmia    occ tachycardia  . Fracture of vertebra, compression ([place])   . GERD (gastroesophageal reflux disease)    occ  . History of kidney stones   . HTN (hypertension)   . ITP (idiopathic thrombocytopenic purpura) 08/29/2013  . Leukocytosis, unspecified 09/05/2013  . Muscle cramp 11/16/2013  . Tachycardia    occ upon exersion, and when put on bp med    Past Surgical History:  Procedure Laterality Date  . ABDOMINAL HYSTERECTOMY    . ANTERIOR LAT LUMBAR FUSION N/A 03/28/2014   Procedure: Anterolateral decompression of L1 fracture with posterior segmental stabilization;  Surgeon: Kristeen Miss, MD;  Location: [place] [facility];  Service: Neurosurgery;  Laterality: N/A;  Anterolateral decompression of Lumbar One fracture with posterior  segmental stabilization  . CESAREAN SECTION     x2  . CHEST TUBE INSERTION Left 03/28/2014   Procedure: CHEST TUBE INSERTION;  Surgeon: Ivin Poot, MD;  Location: [facility];  Service: Thoracic;  Laterality: Left;  . INCISIONAL HERNIA REPAIR N/A 11/13/2014   Procedure: PRIMARY REPAIR INCARCERATED INCISIONAL HERNIA WITH PLACEMENT BIOLOGIC MESH;  Surgeon: Rolm Bookbinder, MD;  Location: [place];  Service: General;  Laterality: N/A;  . LAPAROTOMY Right 09   ovarian cyst  . TOTAL ABDOMINAL HYSTERECTOMY W/ BILATERAL SALPINGOOPHORECTOMY  97    Allergies: Patient has no known allergies.  Medications: Prior to Admission medications   Medication Sig Start Date End Date Taking? Authorizing Provider  carvedilol (COREG) 3.125 MG tablet Take 1 tablet (3.125 mg total) by mouth 2 (two) times daily with a meal. 10/04/20  Yes [name], MD  diphenhydramine-acetaminophen (TYLENOL PM) 25-500 MG TABS tablet Take 1 tablet by mouth at bedtime as needed (For sleep).   Yes [provider]  furosemide (LASIX) 80 MG tablet Take 1 tablet (80 mg total) by mouth See admin instructions. Take 80mg  by mouth once daily.  Take an additional 80mg  daily as needed for weight gain of 3 pounds in one week or 5 pounds in one day. 10/02/20  Yes Aslam, Loralyn Freshwater, MD  loperamide (IMODIUM) 2 MG capsule Take 1 capsule (2 mg total) by mouth as needed for diarrhea or loose stools. 05/18/20  Yes Andrew Au,  MD  potassium chloride (KLOR-CON) 10 MEQ tablet TAKE 1 TABLET(10 MEQ) BY MOUTH DAILY Patient taking differently: Take 10 mEq by mouth daily. 10/04/20  Yes Aslam, Loralyn Freshwater, MD  promethazine (PHENERGAN) 12.5 MG tablet Take 12.5 mg by mouth every 6 (six) hours as needed for nausea or vomiting.   Yes [provider]  vitamin B-12 1000 MCG tablet Take 1 tablet (1,000 mcg total) by mouth daily. 10/01/20  Yes Seawell, Jaimie A, DO     Family History  Problem Relation Age of Onset  . Hypertension Other   . Heart  attack Other   . Leukemia Paternal Uncle        leukemia  . Heart attack Mother   . Hypertension Mother   . Heart disease Father     Social History   Socioeconomic History  . Marital status: Divorced    Spouse name: Not on file  . Number of children: Not on file  . Years of education: Not on file  . Highest education level: Not on file  Occupational History  . Occupation: Retired  . Occupation: Disabled  Tobacco Use  . Smoking status: Former Smoker    Packs/day: 1.00    Years: 15.00    Pack years: 15.00    Types: Cigarettes    Quit date: 09/15/2014    Years since quitting: 6.3  . Smokeless tobacco: Never Used  Vaping Use  . Vaping Use: Never used  Substance and Sexual Activity  . Alcohol use: Yes    Comment: rare  . Drug use: No  . Sexual activity: Not on file  Other Topics Concern  . Not on file  Social History Narrative   Current Social History 01/10/2020        Patient lives with daughter and daughter's fiance in a one level home. There are 3 steps with handrail up to the entrance the patient uses.       Patient's method of transportation is personal car. Patient no longer drives. Family member drives.      The highest level of education was high school diploma.      The patient currently retired and disabled.      Identified important Relationships are "My children and grandchildren (4 with one on the way)       Pets : 1 dog, 1 cat, and 2 birds.       Interests / Fun: Watch TV       Current Stressors: "Nothing really stresses me"       Religious / Personal Beliefs: "I believe in God and Jesus and know I'm going to heaven." ([place])       L. [place], BSN, RN-BC       Social Determinants of Health   Financial Resource Strain: Not on file  Food Insecurity: Not on file  Transportation Needs: Not on file  Physical Activity: Not on file  Stress: Not on file  Social Connections: Not on file     Review of Systems: A 12 point ROS  discussed and pertinent positives are indicated in the HPI above.  All other systems are negative.  Review of Systems  Unable to perform ROS: Mental status change    Vital Signs: BP (!) 145/75 (BP Location: Right Arm)   Pulse (!) 109   Temp 98.1 F (36.7 C) (Axillary)   Resp 19   Ht 5' (1.524 m)   Wt 190 lb 11.2 oz (86.5 kg)   SpO2 93%  BMI 37.24 kg/m   Physical Exam Vitals and nursing note reviewed.  Constitutional:      General: She is not in acute distress.    Appearance: She is ill-appearing.  Cardiovascular:     Rate and Rhythm: Tachycardia present.  Pulmonary:     Effort: Pulmonary effort is normal. No respiratory distress.     Comments: On Bipap Skin:    General: Skin is warm and dry.  Neurological:     General: No focal deficit present.     Mental Status: She is alert.      MD Evaluation Airway:  (on bipap) Heart: WNL Abdomen: WNL Chest/ Lungs:  (on bipap) ASA  Classification: 3 Mallampati/Airway Score: Three   Imaging: CT Abdomen Pelvis Wo Contrast  Result Date: 01/02/2021 CLINICAL DATA:  Patient status post slip and fall today. EXAM: CT ABDOMEN AND PELVIS WITHOUT CONTRAST TECHNIQUE: Multidetector CT imaging of the abdomen and pelvis was performed following the standard protocol without IV contrast. COMPARISON:  CT abdomen and pelvis 11/13/2014. CT chest earlier today and 09/26/2020. FINDINGS: Lower chest: Basilar airspace disease and small right basilar pneumothorax or unchanged compared to the patient's chest CT earlier today. Hepatobiliary: Low attenuating lesion in the posterior right hepatic lobe compatible with hemangioma is unchanged since prior CT abdomen and pelvis. No other focal liver lesion. Multiple stones are seen in the gallbladder but there is no evidence of cholecystitis. The biliary tree appears normal. Pancreas: Unremarkable. No pancreatic ductal dilatation or surrounding inflammatory changes. Spleen: Normal in size without focal  abnormality. Adrenals/Urinary Tract: Adrenal glands are unremarkable. Kidneys are normal, without renal calculi, focal lesion, or hydronephrosis. Bladder is unremarkable. Stomach/Bowel: Stomach is within normal limits. No evidence of appendicitis. No evidence of bowel wall thickening, distention, or inflammatory changes. Vascular/Lymphatic: Aortic atherosclerosis. No enlarged abdominal or pelvic lymph nodes. The aorta is markedly tortuous with a straight right lateral orientation at the level of L2, unchanged. Reproductive: Status post hysterectomy. No adnexal masses. Other: Large ventral hernia is unchanged. Musculoskeletal: As seen on the prior exam, the patient is status post L1 corpectomy. There is extensive bony destructive change throughout the T12 vertebral body which appears new or worse compared to the patient's most recent chest CT. Marked subsidence of the corpectomy strut into the L2 vertebral body is unchanged. Remote compression fractures are seen at all remaining imaged thoracic and lumbar levels. IMPRESSION: Status post L1 corpectomy as seen on prior exams. Bony destructive change throughout the T12 vertebral body appears new or at least worse compared to the most recent comparison dated 09/26/2020. This finding could be due to infection, inflammatory change or possibly tumor. The appearance is not suggestive of fracture due to fall. MRI with and without contrast is the best next test for further evaluation. Remote compression fractures at all imaged thoracic and lumbar levels. Large ventral hernia. Gallstones without evidence of cholecystitis. Bibasilar airspace disease and small right basilar pneumothorax as seen on chest CT today. Electronically Signed   By: Inge Rise M.D.   On: 01/02/2021 20:03   CT Head Wo Contrast  Result Date: 01/02/2021 CLINICAL DATA:  70 year old female with fall. EXAM: CT HEAD WITHOUT CONTRAST CT CERVICAL SPINE WITHOUT CONTRAST TECHNIQUE: Multidetector CT imaging  of the head and cervical spine was performed following the standard protocol without intravenous contrast. Multiplanar CT image reconstructions of the cervical spine were also generated. COMPARISON:  Head CT dated 05/13/2020. FINDINGS: CT HEAD FINDINGS Brain: The ventricles and sulci appropriate size for patient's age.  The gray-white matter discrimination is preserved. There is no acute intracranial hemorrhage. No mass effect or midline shift. No extra-axial fluid collection. Vascular: No hyperdense vessel or unexpected calcification. Skull: Normal. Negative for fracture or focal lesion. Sinuses/Orbits: Mild mucoperiosteal thickening of paranasal sinuses. No air-fluid level. Mastoid air cells are clear. Other: None CT CERVICAL SPINE FINDINGS Alignment: No acute subluxation. Skull base and vertebrae: No acute fracture. Osteopenia. Soft tissues and spinal canal: No prevertebral fluid or swelling. No visible canal hematoma. Disc levels:  No acute findings. Upper chest: Mild emphysema. Minimal right apical pneumothorax. Please see report for the chest CT. Other: None IMPRESSION: 1. No acute intracranial pathology. 2. No acute/traumatic cervical spine pathology. 3. Minimal right apical pneumothorax. Please see report for the chest CT. Electronically Signed   By: Anner Crete M.D.   On: 01/02/2021 16:46   CT Chest Wo Contrast  Result Date: 01/02/2021 CLINICAL DATA:  Suspected rib fracture.  Fall. EXAM: CT CHEST WITHOUT CONTRAST TECHNIQUE: Multidetector CT imaging of the chest was performed following the standard protocol without IV contrast. COMPARISON:  09/26/2020 FINDINGS: Cardiovascular: Tortuous aorta. No aneurysm. Scattered coronary artery and aortic calcifications. Heart is borderline in size. Mediastinum/Nodes: No mediastinal, hilar, or axillary adenopathy. Trachea and esophagus are unremarkable. Bilateral thyroid nodules, the largest 12 mm in the right thyroid lobe. Not clinically significant; no follow-up  imaging recommended (ref: J Am Coll Radiol. 2015 Feb;12(2): 143-50). Lungs/Pleura: Linear areas of scarring or atelectasis in the mid and lower lungs bilaterally. No effusions. Small right pneumothorax noted in the lower chest. Upper Abdomen: Low-density lesion posteriorly in the liver measures 3.8 cm, similar to prior study. No acute findings. Musculoskeletal: Posterior right 9th and 10th rib fractures noted. No additional acute bony abnormality. Chest wall soft tissues are unremarkable. IMPRESSION: Posterior right 9th and 10th rib fractures. Tiny right basilar pneumothorax. Areas of atelectasis or scarring in the mid and lower lungs. Coronary artery disease. Aortic Atherosclerosis (ICD10-I70.0). Electronically Signed   By: Rolm Baptise M.D.   On: 01/02/2021 16:50   CT Cervical Spine Wo Contrast  Result Date: 01/02/2021 CLINICAL DATA:  71 year old female with fall. EXAM: CT HEAD WITHOUT CONTRAST CT CERVICAL SPINE WITHOUT CONTRAST TECHNIQUE: Multidetector CT imaging of the head and cervical spine was performed following the standard protocol without intravenous contrast. Multiplanar CT image reconstructions of the cervical spine were also generated. COMPARISON:  Head CT dated 05/13/2020. FINDINGS: CT HEAD FINDINGS Brain: The ventricles and sulci appropriate size for patient's age. The gray-white matter discrimination is preserved. There is no acute intracranial hemorrhage. No mass effect or midline shift. No extra-axial fluid collection. Vascular: No hyperdense vessel or unexpected calcification. Skull: Normal. Negative for fracture or focal lesion. Sinuses/Orbits: Mild mucoperiosteal thickening of paranasal sinuses. No air-fluid level. Mastoid air cells are clear. Other: None CT CERVICAL SPINE FINDINGS Alignment: No acute subluxation. Skull base and vertebrae: No acute fracture. Osteopenia. Soft tissues and spinal canal: No prevertebral fluid or swelling. No visible canal hematoma. Disc levels:  No acute  findings. Upper chest: Mild emphysema. Minimal right apical pneumothorax. Please see report for the chest CT. Other: None IMPRESSION: 1. No acute intracranial pathology. 2. No acute/traumatic cervical spine pathology. 3. Minimal right apical pneumothorax. Please see report for the chest CT. Electronically Signed   By: Anner Crete M.D.   On: 01/02/2021 16:46   MR THORACIC SPINE WO CONTRAST  Result Date: 01/03/2021 CLINICAL DATA:  [place].  Back pain. EXAM: MRI THORACIC SPINE WITHOUT CONTRAST TECHNIQUE: Multiplanar, multisequence  MR imaging of the thoracic spine was performed. No intravenous contrast was administered. COMPARISON:  CT scan 01/02/2021 FINDINGS: Examination is limited by patient motion and the patient could not complete the examination. Alignment: Exaggerated thoracic kyphosis. The thoracic vertebral bodies are grossly normally aligned. Vertebrae: As demonstrated on the CT scan the T12 vertebral body is partially destroyed. There is abnormal low T1 and high T2 and STIR signal intensity suspicious for tumor. No spinal canal compromise. I do not see any similar findings involving the other vertebral bodies. Remote compression fractures are noted in the thoracic spine and there are T4 and T8 hemangioma is noted. Cord:  Grossly normal thoracic spinal cord. Paraspinal and other soft tissues: No significant paraspinal findings. Disc levels: No obvious large thoracic disc protrusions or significant canal compromise. IMPRESSION: 1. Limited examination due to patient motion and the patient could not complete the examination. 2. The T12 vertebral body is partially destroyed and demonstrates abnormal signal intensity suspicious for tumor. No spinal canal compromise. 3. Remote compression fractures of the thoracic spine. 4. No obvious large thoracic disc protrusions or significant canal compromise. Electronically Signed   By: Marijo Sanes M.D.   On: 01/03/2021 17:32   DG Chest Port 1 View  Result Date:  01/06/2021 CLINICAL DATA:  Right hemothorax. EXAM: PORTABLE CHEST 1 VIEW COMPARISON:  01/05/2021 FINDINGS: Stable cardiac enlargement. Unchanged appearance of right pleural fluid collection. Asymmetric opacities within the right upper lobe and right lower lobe are unchanged. Similar appearance of left base atelectasis. IMPRESSION: 1. No change in aeration to the right upper lobe and right lower lobe. 2. Stable right pleural fluid collection. Electronically Signed   By: Kerby Moors M.D.   On: 01/06/2021 09:37   DG CHEST PORT 1 VIEW  Result Date: 01/05/2021 CLINICAL DATA:  Hemothorax on right. EXAM: PORTABLE CHEST 1 VIEW COMPARISON:  January 05, 2021 FINDINGS: The study is limited due to the low volume portable technique. The cardiomediastinal silhouette is stable. No pneumothorax. Mild atelectasis in the left base. Effusion and opacity on the right is more pronounced in the interval. Whether this represents true change or difference in positioning is unclear. IMPRESSION: 1. Effusion with underlying opacity on the right is more pronounced in the interval. Whether this represents true change or difference in positioning is unclear. No other changes. Electronically Signed   By: Dorise Bullion III M.D   On: 01/05/2021 14:19   DG CHEST PORT 1 VIEW  Result Date: 01/05/2021 CLINICAL DATA:  Hemothorax. EXAM: PORTABLE CHEST 1 VIEW COMPARISON:  01/05/2021 FINDINGS: Stable cardiomediastinal contours. Bilateral pleural effusions are again noted overlying areas of atelectasis. Sharply demarcated lying along the right paratracheal stripe may reflect small residual right paramediastinal. IMPRESSION: 1. No change in bilateral pleural fluid collections with overlying atelectasis. 2. Suspect residual small right paramediastinal pneumothorax. Electronically Signed   By: Kerby Moors M.D.   On: 01/05/2021 11:14   DG CHEST PORT 1 VIEW  Result Date: 01/05/2021 CLINICAL DATA:  Evaluate pneumothorax. EXAM: PORTABLE CHEST 1  VIEW COMPARISON:  01/04/2021 FINDINGS: Stable cardiomediastinal contours. No pneumothorax. Right pleural effusion is again noted and appears increased from previous exam. Similar areas of platelike atelectasis within the left midlung noted. IMPRESSION: Increase in volume of right pleural effusion. Electronically Signed   By: Kerby Moors M.D.   On: 01/05/2021 09:08   DG CHEST PORT 1 VIEW  Result Date: 01/04/2021 CLINICAL DATA:  Shortness of breath.  History of pneumothorax. EXAM: PORTABLE CHEST 1 VIEW COMPARISON:  Earlier today FINDINGS: Stable cardiomediastinal contours. Increased bilateral pleural effusions. Subsegmental atelectasis or scar within the perihilar left mid lung is again noted. Bandlike opacity within the right mid and right upper lung is similar to previous exam and is favored to represent fluid tracking along the fissure. No visible pneumothorax. IMPRESSION: 1. Increased bilateral pleural effusions. 2. Stable bandlike opacity within the right mid and right upper lung. 3. No pneumothorax. Electronically Signed   By: Kerby Moors M.D.   On: 01/04/2021 18:31   DG Chest Port 1 View  Result Date: 01/04/2021 CLINICAL DATA:  Pneumothorax, rib fractures EXAM: PORTABLE CHEST 1 VIEW COMPARISON:  Radiograph 01/03/2021, CT 01/02/2021 FINDINGS: Stable enlarged cardiac silhouette. Layering bilateral effusions and fluid tracking along the fissures, increasing from comparison exam. Diffuse pulmonary vascular congestion with hazy opacities in the perihilar lower lung predominance. Lower thoracic postsurgical changes are unchanged from prior. Additional multilevel degenerative changes in the spine shoulders. IMPRESSION: 1. Increasing pleural effusions with developing vascular congestion and hazy opacities which are suggestive of worsening atelectasis and possible developing edema. 2. Stable cardiomegaly. 3. No visible pneumothorax. 4. Known right rib fractures are better characterized on cross-sectional  imaging. Electronically Signed   By: Lovena Le M.D.   On: 01/04/2021 06:33   DG Chest Port 1 View  Result Date: 01/03/2021 CLINICAL DATA:  Acute respiratory distress EXAM: PORTABLE CHEST 1 VIEW COMPARISON:  01/03/2021 FINDINGS: Cardiac shadow is enlarged but stable. Patchy atelectatic changes are noted within both lungs which has progressed in the interval from the film earlier in the day. Postsurgical changes in the lower thoracic spine are noted. Small right-sided pleural effusion is seen. IMPRESSION: Progressive atelectasis when compare with the earlier film. Small right pleural effusion. Electronically Signed   By: Inez Catalina M.D.   On: 01/03/2021 21:28   DG CHEST PORT 1 VIEW  Result Date: 01/03/2021 CLINICAL DATA:  Rib fractures after fall in bathroom yesterday EXAM: PORTABLE CHEST 1 VIEW COMPARISON:  Chest CT 01/02/2021 FINDINGS: Low volume chest with hazy and streaky density at the bases. Cardiopericardial enlargement. No visible effusion or pneumothorax. Osseous structures are better depicted by prior CT. IMPRESSION: Extensive atelectasis that is progressed from yesterday. Electronically Signed   By: Monte Fantasia M.D.   On: 01/03/2021 07:23    Labs:  CBC: Recent Labs    01/04/21 0049 01/05/21 0215 01/05/21 1536 01/06/21 0620  WBC 15.0* 14.4* 18.3* 13.8*  HGB 10.8* 9.4* 8.5* 8.2*  HCT 35.4* 30.4* 26.8* 25.9*  PLT 65* 55* 89* 87*    COAGS: Recent Labs    04/18/20 2015 04/19/20 0341 05/13/20 2149 06/29/20 1427 09/26/20 1337  INR 1.1  --  1.1 1.0 1.1  APTT  --  31 24 30  33    BMP: Recent Labs    05/14/20 0614 05/15/20 0058 05/16/20 0110 05/17/20 0631 06/29/20 1427 01/03/21 0151 01/04/21 0049 01/05/21 0215 01/06/21 0620  NA 135 138 140 142   < > 139 138 137 137  K 3.9 3.7 3.8 3.9   < > 4.4 4.6 4.7 4.3  CL 92* 96* 97* 99   < > 98 93* 93* 90*  CO2 34* 33* 35* 35*   < > 36* 41* 37* 40*  GLUCOSE 96 136* 95 91   < > 148* 164* 124* 108*  BUN 35* 21 9 7*   <  > 17 18 27* 33*  CALCIUM 8.5* 8.7* 9.1 9.3   < > 9.0 8.9 9.0 9.0  CREATININE 1.87*  0.85 0.55 0.56   < > 0.55 0.67 0.61 0.50  GFRNONAA 27* >60 >60 >60   < > >60 >60 >60 >60  GFRAA 32* >60 >60 >60  --   --   --   --   --    < > = values in this interval not displayed.    LIVER FUNCTION TESTS: Recent Labs    07/08/20 0336 09/26/20 1337 09/27/20 0404 01/04/21 0049  BILITOT 1.8* 0.9 1.0 0.8  AST 18 24 17 23   ALT 21 22 19  36  ALKPHOS 60 114 91 90  PROT 5.5* 6.4* 5.7* 6.2*  ALBUMIN 3.4* 3.0* 2.5* 3.5    TUMOR MARKERS: No results for input(s): AFPTM, CEA, CA199, CHROMGRNA in the last 8760 hours.  Assessment and Plan: T12 bone lesion IR consulted for new T12 bone lesion biopsy.  Case reviewed and approved by Dr. Vernard Gambles.  Patient with change in status yesterday and today, with increased respiratory distress requiring intermittent bipap.  Patient alert, nods to response to some of my questions today, but not consentable at this time.  Will need to be able to lie flat and potentially prone for procedure.  Will continue to monitor.  Thank you for this interesting consult.  I greatly enjoyed meeting [name] and look forward to participating in their care.  A copy of this report was sent to the requesting provider on this date.  Electronically Signed: Docia Barrier, PA 01/06/2021, 2:56 PM   I spent a total of 40 Minutes    in face to face in clinical consultation, greater than 50% of which was counseling/coordinating care for T12 bone lesion.

## 2021-01-07 ENCOUNTER — Inpatient Hospital Stay (HOSPITAL_COMMUNITY): Payer: Medicare PPO

## 2021-01-07 DIAGNOSIS — J90 Pleural effusion, not elsewhere classified: Secondary | ICD-10-CM

## 2021-01-07 DIAGNOSIS — J9622 Acute and chronic respiratory failure with hypercapnia: Secondary | ICD-10-CM | POA: Diagnosis not present

## 2021-01-07 DIAGNOSIS — R0603 Acute respiratory distress: Secondary | ICD-10-CM | POA: Diagnosis not present

## 2021-01-07 DIAGNOSIS — J942 Hemothorax: Secondary | ICD-10-CM | POA: Diagnosis not present

## 2021-01-07 DIAGNOSIS — J9621 Acute and chronic respiratory failure with hypoxia: Secondary | ICD-10-CM

## 2021-01-07 LAB — CBC
HCT: 24.9 % — ABNORMAL LOW (ref 36.0–46.0)
HCT: 26.8 % — ABNORMAL LOW (ref 36.0–46.0)
Hemoglobin: 7.9 g/dL — ABNORMAL LOW (ref 12.0–15.0)
Hemoglobin: 8.4 g/dL — ABNORMAL LOW (ref 12.0–15.0)
MCH: 28.5 pg (ref 26.0–34.0)
MCH: 28.8 pg (ref 26.0–34.0)
MCHC: 31.7 g/dL (ref 30.0–36.0)
MCHC: 31.3 g/dL (ref 30.0–36.0)
MCV: 89.9 fL (ref 80.0–100.0)
MCV: 91.8 fL (ref 80.0–100.0)
Platelets: 136 10*3/uL — ABNORMAL LOW (ref 150–400)
Platelets: 192 10*3/uL (ref 150–400)
RBC: 2.77 MIL/uL — ABNORMAL LOW (ref 3.87–5.11)
RBC: 2.92 MIL/uL — ABNORMAL LOW (ref 3.87–5.11)
RDW: 14.9 % (ref 11.5–15.5)
RDW: 15.4 % (ref 11.5–15.5)
WBC: 14.9 10*3/uL — ABNORMAL HIGH (ref 4.0–10.5)
WBC: 26 10*3/uL — ABNORMAL HIGH (ref 4.0–10.5)
nRBC: 0.2 % (ref 0.0–0.2)
nRBC: 0.3 % — ABNORMAL HIGH (ref 0.0–0.2)

## 2021-01-07 LAB — BASIC METABOLIC PANEL
Anion gap: 10 (ref 5–15)
BUN: 30 mg/dL — ABNORMAL HIGH (ref 8–23)
CO2: 40 mmol/L — ABNORMAL HIGH (ref 22–32)
Calcium: 9.2 mg/dL (ref 8.9–10.3)
Chloride: 90 mmol/L — ABNORMAL LOW (ref 98–111)
Creatinine, Ser: 0.54 mg/dL (ref 0.44–1.00)
GFR, Estimated: >60 mL/min (ref >60)
Glucose, Bld: 102 mg/dL — ABNORMAL HIGH (ref 70–99)
Potassium: 3.6 mmol/L (ref 3.5–5.1)
Sodium: 140 mmol/L (ref 135–145)

## 2021-01-07 LAB — MAGNESIUM: Magnesium: 2 mg/dL (ref 1.7–2.4)

## 2021-01-07 MED ORDER — HYDROMORPHONE HCL 1 MG/ML IJ SOLN
0.2500 mg | INTRAMUSCULAR | Status: DC | PRN
  Administered 2021-01-07: 0.25 mg via INTRAVENOUS
  Filled 2021-01-07: qty 1

## 2021-01-07 MED ORDER — FENTANYL CITRATE (PF) 100 MCG/2ML IJ SOLN: 12.5000 ug | INTRAMUSCULAR | Status: DC | PRN

## 2021-01-07 MED ORDER — ACETAMINOPHEN 10 MG/ML IV SOLN
1000.0000 mg | Freq: Four times a day (QID) | INTRAVENOUS | Status: AC
  Administered 2021-01-07 – 2021-01-08 (×4): 1000 mg via INTRAVENOUS
  Filled 2021-01-07 (×5): qty 100

## 2021-01-07 MED ORDER — FUROSEMIDE 10 MG/ML IJ SOLN
INTRAMUSCULAR | Status: AC
  Filled 2021-01-07: qty 4

## 2021-01-07 MED ORDER — KETOROLAC TROMETHAMINE 15 MG/ML IJ SOLN
15.0000 mg | Freq: Four times a day (QID) | INTRAMUSCULAR | Status: DC
  Administered 2021-01-07: 15 mg via INTRAVENOUS

## 2021-01-07 MED ORDER — KETOROLAC TROMETHAMINE 15 MG/ML IJ SOLN
15.0000 mg | Freq: Four times a day (QID) | INTRAMUSCULAR | Status: AC
  Administered 2021-01-07 – 2021-01-12 (×19): 15 mg via INTRAVENOUS
  Filled 2021-01-07 (×19): qty 1

## 2021-01-07 MED ORDER — FENTANYL CITRATE (PF) 100 MCG/2ML IJ SOLN
12.5000 ug | INTRAMUSCULAR | Status: DC | PRN
  Administered 2021-01-08 – 2021-01-15 (×18): 12.5 ug via INTRAVENOUS
  Filled 2021-01-07 (×20): qty 2

## 2021-01-07 MED ORDER — FUROSEMIDE 10 MG/ML IJ SOLN
80.0000 mg | Freq: Once | INTRAMUSCULAR | Status: AC
  Administered 2021-01-07: 80 mg via INTRAVENOUS
  Filled 2021-01-07: qty 8

## 2021-01-07 NOTE — Progress Notes (Signed)
Patient desated in the 60's and quickly dropped in the 50's. Applied BiPap. Rapid called. RT notified. Currently saturation at 95% on BiPap.  Daymon Larsen, RN

## 2021-01-07 NOTE — Progress Notes (Signed)
IR following for timing of T12 biopsy - per chart patient with continued desaturation when off BiPAP today. Patient will need to be stable off BiPAP and able to tolerate prone position for biopsy.  No procedure planned at this time given above, we will continue to follow and determine timing once status improves.  Please call IR with questions or concerns.  Candiss Norse, PA-C

## 2021-01-07 NOTE — Progress Notes (Signed)
Patient requested to be taken off BiPAP at this time for a break. Placed on 8L [place]ter. Vitals stable. RT will continue to monitor.

## 2021-01-07 NOTE — Progress Notes (Addendum)
NAME:  [name], MRN:  [id number], DOB:  1951/08/02, LOS: 3 ADMISSION DATE:  01/02/2021, CONSULTATION DATE:  01/07/21 REFERRING MD: Graciella Freer CHIEF COMPLAINT:  Encephalopathy   History of Present Illness:  70 year old with chronic hypoxemic and hypercarbic respiratory failure in setting of presumed OHS/OSA who was admitted as a trauma 4/20 after fall with rib fractures.  H&P reviewed.  Daily progress note reviewed.  Patient mechanical fall at home.  She fell, backwards, hit the side of the bathtub with her right chest.  Had pain there.  Went to ED.  Chest x-ray on my interpretation showed rib fractures on the right, no pneumothorax appreciated.  CT chest was obtained as part of trauma protocol which showed a tiny anterior pneumothorax.  She was moved to trauma service.  Eventually transferred to the internal medicine teaching service.  Review of labs indicate stable chemistries with elevated bicarb as high as 41.  CBC is reviewed with mild leukocytosis, stable anemia, relatively stable thrombocytopenia.  Of note she has a history of ITP.  On the afternoon of 4/22 she became progressively encephalopathic.  ABG was obtained which revealed a pH of 718 with a PCO2 of 114.  She was placed on BiPAP. Mental status improved.  Pertinent  Medical History  OHS/OSA  Significant Hospital Events: Including procedures, antibiotic start and stop dates in addition to other pertinent events   . 4/20 admitted . 4/22, BiPAP improved MS after PCO2 was 114 . 4/23, worsened effusion, ~800cc dark blood from R chest thora, very difficult to position patient due to habitus and pain . 4/25 morning desaturated to 50% off bipap  Interim History / Subjective:  Seen and examined this morning. Desaturated to 50% while off bipap and on nasal cannula.  Says her breathing "feels fine" wants to eat something  Objective   Blood pressure 127/72, pulse (!) 106, temperature 98 F (36.7 C), temperature source Axillary, resp.  rate 20, height 5' (1.524 m), weight 86.5 kg, SpO2 96 %.        Intake/Output Summary (Last 24 hours) at 01/07/2021 1408 Last data filed at 01/07/2021 1153 Gross per 24 hour  Intake --  Output 1125 ml  Net -1125 ml   Filed Weights   01/02/21 1550 01/03/21 2316  Weight: 89.6 kg 86.5 kg    Examination: Constitutional: chronically ill and obese woman on BIPAP  Ears, nose, mouth, and throat: on bipap with medium facemask  Cardiovascular: RRR, ext warm Respiratory: diminished bilaterally, no wheezes or crackles Gastrointestinal: Soft, +BS Neurologic: Profoundly deconditioned, moves ext to command Ext: chronic lymphedema but improving   Labs/imaging that I have personally reviewed   CXR this morning shows right airspace opacities - combination atelectasis and effusion Platelets 136, Hgb 7.9  Resolved Hospital Problem list   n/a  Assessment & Plan:  # Acute on chronic hypoxemic and hypercarbic respiratory failure: In setting of fall, rib fractures, splinting, hypoventilation. Mental status improved with BiPAP. # Complicated by delayed onset R hemothorax post partial evacuation with thoracentesis 4/23.  Received 1 unit plts 4/23 to reduce chance of enlarging. # Hx ITP, class 2 obesity, OHS, profound deconditioning # T12 lesion question tumor vs. Infection- CT directed biopsy pending respiratory status  - discussed with RT and primary team - would have her sit up in bed more. She derecruits very easily due to body habitus and positioning.  I was able to wean her FiO2 down from 60% to 40% today. Have asked to give her another trial  off bipap later today once she is positioned more optimally  - she is not appropriate for any surgical solution to her hemothorax at this time again based on respiratory status.  - will continue to follow closely. I do not think she needs ICU at this time, but this could change.   Lenice Llamas, MD Pulmonary and [place]   Addendum: Performed bedside ultrasound which shows suspicion for persistent loculated/thickened effusion. Treatment for this would be large bore chest tube vs surgical decortication/hemostasis. I don't think she's a great candidate for surgery. Would proceed with CT Chest - I have laid her flat and it seems like she could tolerate.

## 2021-01-07 NOTE — Progress Notes (Addendum)
Raised patient's bed to position more optimally and trialed off of BiPAP per PCCM. Placed initially on 10L O2 by HFNC and saturated 100%, weaned O2 to her home 4L and she maintained saturations of 90-92%. Goal is 88-92%. I watched the patient for about 15 minutes and her saturation remained at that level. Patient remains alert, she is able to turn the TV on and change the channel to her preferred station. She is currently stable on nasal canula.   Left pager number with nurse. Patient received toradol and lasix. Goal O2 saturation of 88-92%.

## 2021-01-07 NOTE — Progress Notes (Signed)
Patient trialed off BiPAP at this time with MD and RN at bedside. Patient initially placed on 10L [place] and weaned down to 4L. Patient stable at this time. RT will continue to monitor.

## 2021-01-07 NOTE — Progress Notes (Addendum)
Subjective:   Yesterday, patient was able to remain off BiPAP for a few hours in the morning but had desaturation in the afternoon and required BiPAP the rest of the day. This morning attempted a break from BiPAP per patient request but quickly desaturated, placed back on BiPAP and now maintaining saturation.   She is alert and oriented, no new complaints. Requests water, discussed that she will need to be NPO while on BiPAP. She understands but is frustrated.   At this time she is stable on BiPAP, but becomes unstable when off of it. We will check back in later today.   Objective:  Vital signs in last 24 hours: Vitals:   01/06/21 1900 01/06/21 2300 01/06/21 2309 01/07/21 0300  BP: (!) 146/67 (!) 141/76  (!) 149/78  Pulse: 91 91 90 94  Resp: 20 18 (!) 26 20  Temp: 98 F (36.7 C) 98.5 F (36.9 C)  99.1 F (37.3 C)  TempSrc: Axillary Axillary  Oral  SpO2: 100% 100% 96% 98%  Weight:      Height:       Physical Exam Vitals and nursing note reviewed.  Constitutional: appears uncomfortartable Head: atraumatic Cardiovascular: regular rate and rhythm, normal heart sounds Pulmonary: on BiPAP, coarse breath sounds, saturating 92% Musculoskeletal: pain over right ribs Skin: warm and dry, abrasion on chin reportedly from cervical collar, bilateral lower extremities with unna boots on Neurological: alert, no focal deficit  Assessment/Plan:  Ms. Putz is a 70 y/o F with hx of chronic respiratory failure on 3-4L chronically, morbid obesity, HFpEF, chronic ITP, chronic leg wounds who presents with rib fractures after mechanical fall. Acute on chronic hypercapneic and hypoxic respiratory failure, also found to have hemothorax.  Principal Problem:   Pneumothorax Active Problems:   Multiple closed fractures of ribs of right side   Fall against object   Acute respiratory distress   Rib fractures   Hemothorax on right  Acute on chronic hypercapneic and hypoxic respiratory  failure Traumatic hemothorax in the setting of chronic ITP Ribs fracture due to mechanical fall Mechanical fall in bathroom with right 9th and 10th rib fractures and small pneumothorax on admission.  Home oxygen requirement of 3 to 4 L of oxygen at baseline.  Clinical course has been complicated by worsening hypercarbia and development of hemothorax with 800 cc drained on 4/23.  CXR this morning is slightly improved. No evidence of encephalopathy.  - PCCM following; appreciate their recommendations - Continue BiPAP, has now required this for several days. Is stable when on BiPAP but desaturated without it yesterday. - Continue BiPAP at night - Chest x-ray and ABG if worsening respiratory status develops - If hemothorax re-accumulates, will likely need transfer to ICU for chest tube and intubation - Multimodal pain control with Tylenol, Lidoderm patch, cold compress, low dose Dilaudid. Decreased dose further to 0.25mg  IV today - Pulm toilet, incentive spirometer when off BiPAP - spO2 goal of 88-92%   T12 destructive change suspicious for tumor MRI with change at T12 concerning for malignant or infectious etiology.  No clear primary at this point. Has never had a mammogram, breast exam without overt abnormality. 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 Unable to biopsy for now given her clinical status - outpatient mammogram, colonoscopy   Chronic HFpEF OSA - Continue home medications of Coreg, and Klor-Con - Continue home Lasix 80mg  today  -  O2 as above - BiPAP at night   Chronic Venous Stasis -- Unna wrappings   Chronic ITP - Daily CBC - Hold off on VTE prophylaxis with this and her hemothorax  VTE prophylaxis: holding off with her thrombocytopenia and hemothoraax Prior to Admission Living Arrangement: home Anticipated Discharge Location: pending Barriers to Discharge:  medical workup, pain control  Dispo: Unclear  Dr. Edison Simon Internal Medicine PGY-1  Pager: [phone] After 5pm on weekdays and 1pm on weekends: On Call pager [phone]  01/07/2021, 7:07 AM

## 2021-01-07 NOTE — Progress Notes (Signed)
Patient saturation staying between 84-86% on 8L HFNC with HR 127-133. RT notified. Applied BiPap. Vitals 138/84 BP, 133 Pulse, 29 Respirations, 89% on BiPap. Notified MD. MD stated to leave pt on BiPap and monitor HR.  Daymon Larsen, RN

## 2021-01-08 ENCOUNTER — Encounter (HOSPITAL_COMMUNITY): Payer: Self-pay | Admitting: Internal Medicine

## 2021-01-08 ENCOUNTER — Inpatient Hospital Stay (HOSPITAL_COMMUNITY): Payer: Medicare PPO

## 2021-01-08 ENCOUNTER — Other Ambulatory Visit: Payer: Self-pay | Admitting: Internal Medicine

## 2021-01-08 DIAGNOSIS — R0603 Acute respiratory distress: Secondary | ICD-10-CM | POA: Diagnosis not present

## 2021-01-08 DIAGNOSIS — S2241XG Multiple fractures of ribs, right side, subsequent encounter for fracture with delayed healing: Secondary | ICD-10-CM | POA: Diagnosis not present

## 2021-01-08 DIAGNOSIS — M79605 Pain in left leg: Secondary | ICD-10-CM

## 2021-01-08 DIAGNOSIS — S270XXA Traumatic pneumothorax, initial encounter: Secondary | ICD-10-CM | POA: Diagnosis not present

## 2021-01-08 DIAGNOSIS — J942 Hemothorax: Secondary | ICD-10-CM | POA: Diagnosis not present

## 2021-01-08 DIAGNOSIS — J9621 Acute and chronic respiratory failure with hypoxia: Secondary | ICD-10-CM | POA: Diagnosis not present

## 2021-01-08 LAB — GLUCOSE, CAPILLARY
Glucose-Capillary: 105 mg/dL — ABNORMAL HIGH (ref 70–99)
Glucose-Capillary: 107 mg/dL — ABNORMAL HIGH (ref 70–99)
Glucose-Capillary: 110 mg/dL — ABNORMAL HIGH (ref 70–99)

## 2021-01-08 LAB — BASIC METABOLIC PANEL
Anion gap: 14 (ref 5–15)
BUN: 51 mg/dL — ABNORMAL HIGH (ref 8–23)
CO2: 38 mmol/L — ABNORMAL HIGH (ref 22–32)
Calcium: 8.8 mg/dL — ABNORMAL LOW (ref 8.9–10.3)
Chloride: 90 mmol/L — ABNORMAL LOW (ref 98–111)
Creatinine, Ser: 0.84 mg/dL (ref 0.44–1.00)
GFR, Estimated: >60 mL/min (ref >60)
Glucose, Bld: 115 mg/dL — ABNORMAL HIGH (ref 70–99)
Potassium: 3.4 mmol/L — ABNORMAL LOW (ref 3.5–5.1)
Sodium: 142 mmol/L (ref 135–145)

## 2021-01-08 LAB — BLOOD GAS, ARTERIAL
Acid-Base Excess: 15 mmol/L — ABNORMAL HIGH (ref 0.0–2.0)
Bicarbonate: 39.8 mmol/L — ABNORMAL HIGH (ref 20.0–28.0)
Drawn by: 30136
FIO2: 40
O2 Saturation: 92.3 %
Patient temperature: 37
pCO2 arterial: 55.5 mmHg — ABNORMAL HIGH (ref 32.0–48.0)
pH, Arterial: 7.469 — ABNORMAL HIGH (ref 7.350–7.450)
pO2, Arterial: 63.2 mmHg — ABNORMAL LOW (ref 83.0–108.0)

## 2021-01-08 LAB — BODY FLUID CELL COUNT WITH DIFFERENTIAL
Eos, Fluid: 1 %
Lymphs, Fluid: 15 %
Monocyte-Macrophage-Serous Fluid: 1 % — ABNORMAL LOW (ref 50–90)
Neutrophil Count, Fluid: 82 % — ABNORMAL HIGH (ref 0–25)
Other Cells, Fluid: 1 %
Total Nucleated Cell Count, Fluid: 8128 cu mm — ABNORMAL HIGH (ref 0–1000)

## 2021-01-08 LAB — CBC
HCT: 24.3 % — ABNORMAL LOW (ref 36.0–46.0)
HCT: 22.3 % — ABNORMAL LOW (ref 36.0–46.0)
Hemoglobin: 7.6 g/dL — ABNORMAL LOW (ref 12.0–15.0)
Hemoglobin: 7 g/dL — ABNORMAL LOW (ref 12.0–15.0)
MCH: 28.1 pg (ref 26.0–34.0)
MCH: 28.8 pg (ref 26.0–34.0)
MCHC: 31.3 g/dL (ref 30.0–36.0)
MCHC: 31.4 g/dL (ref 30.0–36.0)
MCV: 90 fL (ref 80.0–100.0)
MCV: 91.8 fL (ref 80.0–100.0)
Platelets: 124 10*3/uL — ABNORMAL LOW (ref 150–400)
Platelets: 125 10*3/uL — ABNORMAL LOW (ref 150–400)
RBC: 2.7 MIL/uL — ABNORMAL LOW (ref 3.87–5.11)
RBC: 2.43 MIL/uL — ABNORMAL LOW (ref 3.87–5.11)
RDW: 15.9 % — ABNORMAL HIGH (ref 11.5–15.5)
RDW: 16.7 % — ABNORMAL HIGH (ref 11.5–15.5)
WBC: 19.9 10*3/uL — ABNORMAL HIGH (ref 4.0–10.5)
WBC: 21 10*3/uL — ABNORMAL HIGH (ref 4.0–10.5)
nRBC: 0.5 % — ABNORMAL HIGH (ref 0.0–0.2)
nRBC: 0.4 % — ABNORMAL HIGH (ref 0.0–0.2)

## 2021-01-08 LAB — MAGNESIUM: Magnesium: 2.1 mg/dL (ref 1.7–2.4)

## 2021-01-08 LAB — PROTIME-INR
INR: 1.2 (ref 0.8–1.2)
Prothrombin Time: 14.9 seconds (ref 11.4–15.2)

## 2021-01-08 LAB — PREPARE RBC (CROSSMATCH)

## 2021-01-08 MED ORDER — ACETAMINOPHEN 10 MG/ML IV SOLN
1000.0000 mg | Freq: Four times a day (QID) | INTRAVENOUS | Status: AC
Start: 2021-01-08 — End: 2021-01-09
  Administered 2021-01-08 – 2021-01-09 (×4): 1000 mg via INTRAVENOUS
  Filled 2021-01-08 (×4): qty 100

## 2021-01-08 MED ORDER — SODIUM CHLORIDE 0.9 % IV SOLN
2.0000 g | Freq: Three times a day (TID) | INTRAVENOUS | Status: DC
  Administered 2021-01-08 – 2021-01-09 (×4): 2 g via INTRAVENOUS
  Filled 2021-01-08 (×5): qty 2

## 2021-01-08 MED ORDER — SODIUM CHLORIDE 0.9% IV SOLUTION: Freq: Once | INTRAVENOUS | Status: AC

## 2021-01-08 MED ORDER — MIDAZOLAM HCL 2 MG/2ML IJ SOLN
INTRAMUSCULAR | Status: AC | PRN
  Administered 2021-01-08 (×3): 1 mg via INTRAVENOUS

## 2021-01-08 MED ORDER — FENTANYL CITRATE (PF) 100 MCG/2ML IJ SOLN
INTRAMUSCULAR | Status: AC
  Administered 2021-01-09: 12.5 ug via INTRAVENOUS
  Filled 2021-01-08: qty 2

## 2021-01-08 MED ORDER — LIDOCAINE HCL 1 % IJ SOLN
INTRAMUSCULAR | Status: AC
  Filled 2021-01-08: qty 20

## 2021-01-08 MED ORDER — MIDAZOLAM HCL 2 MG/2ML IJ SOLN
INTRAMUSCULAR | Status: AC
  Filled 2021-01-08: qty 2

## 2021-01-08 MED ORDER — FENTANYL CITRATE (PF) 100 MCG/2ML IJ SOLN
INTRAMUSCULAR | Status: AC | PRN
  Administered 2021-01-08 (×3): 50 ug via INTRAVENOUS

## 2021-01-08 MED ORDER — POTASSIUM CHLORIDE 10 MEQ/100ML IV SOLN
10.0000 meq | INTRAVENOUS | Status: AC
  Administered 2021-01-08 (×4): 10 meq via INTRAVENOUS
  Filled 2021-01-08 (×3): qty 100

## 2021-01-08 MED ORDER — POTASSIUM CHLORIDE 10 MEQ/100ML IV SOLN
INTRAVENOUS | Status: AC
  Administered 2021-01-08: 10 meq via INTRAVENOUS
  Filled 2021-01-08: qty 100

## 2021-01-08 NOTE — Progress Notes (Signed)
Pharmacy Antibiotic Note  [name] is a 70 y.o. female admitted on 01/02/2021 now with possible pneumonia.  Pharmacy has been consulted for cefepime dosing.  WBC are elevated, likely in the context of recent trauma and hemothorax. Patient is afebrile but on bipap and high risk for respiratory decompensation.This is day 6 of hospitalization, no antibiotics or cultures this admission.  Plan: Cefepime 2g IV q8hr  Monitor cultures if respiratory culture is obtained Follow renal function for dose adjustments  Height: 5' (152.4 cm) Weight: 86.5 kg (190 lb 11.2 oz) IBW/kg (Calculated) : 45.5  Temp (24hrs), Avg:98.2 F (36.8 C), Min:97.9 F (36.6 C), Max:98.7 F (37.1 C)  Recent Labs  Lab 01/04/21 0049 01/05/21 0215 01/05/21 1536 01/06/21 0620 01/06/21 1630 01/07/21 0618 01/07/21 1654 01/08/21 0515  WBC 15.0* 14.4*   < > 13.8* 16.7* 14.9* 26.0* 19.9*  CREATININE 0.67 0.61  --  0.50  --  0.54  --  0.84   < > = values in this interval not displayed.    Estimated Creatinine Clearance: 62.6 mL/min (by C-G formula based on SCr of 0.84 mg/dL).    No Known Allergies  Antimicrobials this admission: none  Dose adjustments this admission: N/A  Microbiology results: N/A  Thank you for allowing pharmacy to be a part of this patient's care.  Norina Buzzard, PharmD PGY1 Pharmacy Resident 01/08/2021 7:47 AM

## 2021-01-08 NOTE — Progress Notes (Signed)
Subjective:   Patient states she is not doing well today. Reports pain in her back and in her legs. The leg pain reportedly began 30 minutes ago.  Discussed the possibility that  she may have to be intubated to help with her breathing. Asked if she would be able to get back off of machine, discussed that we cannot definitively answer that question. Discussed CODE status. She is currently unsure what her goals of care are. She would also like for [place] to speak with her daughter and "her brother, Randall Hiss" and designates them as joint surrogate decision makers.  I spoke with her daughter, [name], who understands that her mother is in serious condition, and if intubated may not be able to wean off of it. She wants to anything that can give the best chance of survival. She is planning to visit this evening after she gets off work around [place].  I could reach Randall Hiss but spoke with his wife, Lonn Georgia, who states he is at work until [employer].   Objective:  Vital signs in last 24 hours: Vitals:   01/07/21 2328 01/08/21 0000 01/08/21 0255 01/08/21 0323  BP: (!) 116/91 (!) 136/93  133/81  Pulse: (!) 111 (!) 104 (!) 104 (!) 101  Resp: (!) 27 (!) 24 (!) 24 (!) 24  Temp: 98.2 F (36.8 C) 98.3 F (36.8 C)  98.7 F (37.1 C)  TempSrc: Axillary Axillary  Axillary  SpO2: 95% 95% 94% 95%  Weight:      Height:       Physical Exam Vitals and nursing note reviewed.  Constitutional: appears uncomfortartable Head: atraumatic Cardiovascular: regular rate and rhythm, normal heart sounds Pulmonary: on BiPAP, coarse breath sounds, fair tidal volumes Musculoskeletal: pain over right ribs Skin: warm and dry, abrasion on chin reportedly from cervical collar, bilateral lower extremities with unna boots on, tenderness in calves bilaterally but seems to be generally tender with any manipulation Neurological: alert, no focal deficit  Assessment/Plan:  Ms. Colantuono is a 70 y/o F with hx of chronic respiratory failure on 3-4L  chronically, morbid obesity, HFpEF, chronic ITP, chronic leg wounds who presents with rib fractures after mechanical fall. Acute on chronic hypercapneic and hypoxic respiratory failure, also found to have hemothorax.  Principal Problem:   Pneumothorax Active Problems:   Pressure injury of skin   Multiple closed fractures of ribs of right side   Fall against object   Acute respiratory distress   Rib fractures   Hemothorax on right   Pleural effusion  Acute on chronic hypercapneic and hypoxic respiratory failure Traumatic hemothorax in the setting of chronic ITP Ribs fracture due to mechanical fall Mechanical fall in bathroom with right 9th and 10th rib fractures and small pneumothorax on admission.  Home oxygen requirement of 3 to 4 L of oxygen at baseline.  Clinical course has been complicated by worsening hypercarbia and development of hemothorax with 800 cc drained on 4/23.  CT scan yesterday demonstrated reaccumulation of a large hemothorax. Pneumonia could be possible as well with her rising white count.  - PCCM following; appreciate their recommendations. Appreciate their input on management of her hemothorax - Continue BiPAP, has now required this for several days. Is stable when on BiPAP but desaturated without it yesterday. - Chest x-ray and ABG if worsening respiratory status develops - Multimodal pain control with lidoderm patch, IV toradol, IV tylenol, and fentanyl - Pulm toilet, incentive spirometer when off BiPAP - spO2 goal of 88-92% - start Cefepime for empiric  treatment of possible pneumonia - Lower extremity doppler for calf pain   T12 destructive change suspicious for tumor MRI with change at T12 concerning for malignant or infectious etiology.  No clear primary at this point. Has never had a mammogram, breast exam without overt abnormality. 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 Unable to biopsy for now given her clinical status - outpatient mammogram, colonoscopy   Chronic HFpEF OSA - Continue home medications of Coreg, and Klor-Con - Continue home Lasix 80mg  today  - O2 as above - BiPAP at night   Chronic Venous Stasis -- Unna wrappings   Chronic ITP - Daily CBC - Hold off on VTE prophylaxis with this and her hemothorax  VTE prophylaxis: holding off with her thrombocytopenia and hemothoraax Prior to Admission Living Arrangement: home Anticipated Discharge Location: pending Barriers to Discharge: medical workup, pain control  Dispo: Unclear  Dr. Edison Simon Internal Medicine PGY-1  Pager: [phone] After 5pm on weekdays and 1pm on weekends: On Call pager [phone]  01/08/2021, 6:20 AM

## 2021-01-08 NOTE — Consult Note (Signed)
   [facility] Inpatient Consult   01/08/2021  PEIGHTON MEHRA [birth year] [phone]   [employer] [ACO] Patient: [name] Medicare   Patient screened for hospitalization with noted high risk score for unplanned readmission risk and to assess for potential [place] Management service needs for post hospital transition.  Review of patient's medical record reveals patient is a patient in an Warden/ranger.  Plan:  Continue to follow progress and disposition to assess for post hospital care management needs.    For questions contact:   Natividad Brood, RN BSN [facility] Liaison  [phone] business mobile phone Toll free office [phone]  Fax number: [phone] [email or web address]

## 2021-01-08 NOTE — Plan of Care (Signed)

## 2021-01-08 NOTE — Progress Notes (Signed)
NAME:  [name], MRN:  [id number], DOB:  [birth year], LOS: 4 ADMISSION DATE:  01/02/2021, CONSULTATION DATE:  01/08/21 REFERRING MD: Graciella Freer CHIEF COMPLAINT:  Encephalopathy   History of Present Illness:  70 year old with chronic hypoxemic and hypercarbic respiratory failure in setting of presumed OHS/OSA who was admitted as a trauma 4/20 after fall with rib fractures.  H&P reviewed.  Daily progress note reviewed.  Patient mechanical fall at home.  She fell, backwards, hit the side of the bathtub with her right chest.  Had pain there.  Went to ED.  Chest x-ray on my interpretation showed rib fractures on the right, no pneumothorax appreciated.  CT chest was obtained as part of trauma protocol which showed a tiny anterior pneumothorax.  She was moved to trauma service.  Eventually transferred to the internal medicine teaching service.  Review of labs indicate stable chemistries with elevated bicarb as high as 41.  CBC is reviewed with mild leukocytosis, stable anemia, relatively stable thrombocytopenia.  Of note she has a history of ITP.  On the afternoon of 4/22 she became progressively encephalopathic.  ABG was obtained which revealed a pH of 718 with a PCO2 of 114.  She was placed on BiPAP. Mental status improved.  Pertinent  Medical History  OHS/OSA  Significant Hospital Events: Including procedures, antibiotic start and stop dates in addition to other pertinent events   . 4/20 admitted . 4/22, BiPAP improved MS after PCO2 was 114 . 4/23, worsened effusion, ~800cc dark blood from R chest thora, very difficult to position patient due to habitus and pain . 4/25 morning desaturated to 50% off bipap  CT chest 4/25>>>1. Moderate to large right hemothorax. Previous right pneumothorax has near completely resolved with only small residual focus of air anteriorly. 2. Small complex left pleural effusion has slightly increased in size from prior exam. 3. Compressive atelectasis throughout the  right lung includes complete lower lobe atelectasis. Scattered atelectasis throughout the left lung. 4. Destructive changes involving anterior inferior aspect of T12 vertebral body, unchanged from recent imaging. Stable appearance of multiple additional thoracic compression fractures. 5. Slight increased displacement of right posterior tenth rib fracture from prior exam. Fracture appears segmental with both posterior as well as central fracture component at the costovertebral junction. Right ninth rib fracture is unchanged in alignment.  Interim History / Subjective:  No sig change overnight.  Tol off bipap ~3hrs yesterday afternoon per daughter Denies SOB  C/o IV burning (K supp)   Objective   Blood pressure 129/82, pulse (!) 104, temperature 98.7 F (37.1 C), temperature source Axillary, resp. rate (!) 26, height 5' (1.524 m), weight 86.5 kg, SpO2 94 %.    FiO2 (%):  [40 %] 40 %   Intake/Output Summary (Last 24 hours) at 01/08/2021 4098 Last data filed at 01/08/2021 1191 Gross per 24 hour  Intake 187.36 ml  Output 1000 ml  Net -812.64 ml   Filed Weights   01/02/21 1550 01/03/21 2316  Weight: 89.6 kg 86.5 kg    Examination: Constitutional: obese, chronically ill appearing female, NAD on bipap  Ears, nose, mouth, and throat: on bipap Cardiovascular: NSR, s1s2 rrr Respiratory: resps even non labored on bipap, diminished throughout  Gastrointestinal: Soft, +BS Neurologic: awake, alert, answers questions, follows commands, significant generalized weakness  Ext: chronic lymphedema, wrapped BLE    Labs/imaging that I have personally reviewed   CT chest, chem, CBC  Resolved Hospital Problem list   n/a  Assessment & Plan:  # Acute on  chronic hypoxemic and hypercarbic respiratory failure: In setting of fall, rib fractures, splinting, hypoventilation, R hemothorax and L complicated effusion.  Mental status improved with BiPAP. # Complicated by delayed onset R hemothorax post  partial evacuation with thoracentesis 4/23.  Received 1 unit plts 4/23 to reduce chance of enlarging. # Hx ITP, class 2 obesity, OHS, profound deconditioning # T12 lesion question tumor vs. Infection- CT directed biopsy pending respiratory status Blood loss anemia   Pulmonary hygiene, mobilize, sit up in bed  Pain control but cautious to avoid oversedation  IR drainage hemothorax  Continue bipap  abx initiated per primary  Continue diuresis as tol  Trend hgb   Discussed at length at bedside with pt and daughter.  Poor functional status at baseline.  Lives with daughter. On a good day she can ambulate with a walker to the bathroom but otherwise needs some form of assistance for all ADL's.  Gets SOB very easily.  Discussed goals of care, potential need for intubation if resp status worsens or does not improve.  They will continue to discuss. Daughter realizes that she is in poor health and that it may be difficult to wean from vent if she requires intubation, but hopes there "is a chance".    Nickolas Madrid, NP Pulmonary/Critical Care Medicine  01/08/2021  9:29 AM

## 2021-01-08 NOTE — Procedures (Signed)
Interventional Radiology Procedure Note  Procedure: Right chest tube  Indication: Right hemothorax  Findings: 12 fr right chest tube placed CT guidance.  Maintain at -20 cm H20 and low continous wall suction. Please see PACS for complete report. Complications: None  EBL: < 10 mL  Miachel Roux, MD [phone]

## 2021-01-08 NOTE — Progress Notes (Signed)
Patient transported to CT for chest tube and back to 4E14 without any apparent complications.

## 2021-01-08 NOTE — Progress Notes (Signed)
Discussed goals of care with patient, daughter [name], and son [name] at bedside. They understand that she has been on BiPAP for several days and failed several trials off BiPAP to nasal canula. Discussed plan for chest tube which they agree with.  We spoke about the possibility of intubation and mechanical ventilation, and that with her baseline poor pulmonary status, she would be at very high risk to become dependent on the ventilator. Discussed the alternative to focus on her comfort. At this time, they are in agreement that they want everything done to give her the highest chance of surviving.   Patient remains full scope of care. She desires ventilation if it comes to that. She has designated [employer] as Risk manager.   Ordered 1 unit pRBCs, hgb of 7.0 this evening, suspect component of symptomatic anemia with her drop in hgb during this admission.

## 2021-01-08 NOTE — Progress Notes (Signed)
RT assisted RN with mouth care for the patient. Pt's O2 level dropped to 50's after 1-2 minutes off of bipap. RT placed bipap back on pt and turned FIO2 to 100% until patients O2 level recovered. Patient also became tachy with RR to the 50's.

## 2021-01-08 NOTE — Progress Notes (Signed)
Chief Complaint: Patient was seen in consultation today for right chest drain  Referring Physician(s): Dr. Melynda Keller NP  Supervising Physician: Mir, Sharen Heck  Patient Status: [facility] - In-pt  History of Present Illness: [name] is a 70 y.o. female who is admitted with acute on chronic resp failure. Currently on Bipap. Also recently fell and has right rib fractures and T12 destructive changes as well as large right effusion/hemothorax. IR initially consulted for bx of T12 lesion but due to respiratory status, this has been deferred. IR is now asked to place right chest drain for evacuation of hemothorax/effusion. PMHx, meds, labs, imaging, allergies reviewed. Feels well, no recent fevers, chills, illness. Has been NPO today as directed.    Past Medical History:  Diagnosis Date  . Arthritis    lumbar burst fx, osteoporosis  . Back pain 09/29/2013  . Blood dyscrasia   . Chest pain    denies  . Chronic kidney disease    passed stones spontaneously- 40 yrs. ago   . Depression    states her current situation with activity limitations, she has a low spirit at times.   Marland Kitchen Dysrhythmia    occ tachycardia  . Fracture of vertebra, compression ([place])   . GERD (gastroesophageal reflux disease)    occ  . History of kidney stones   . HTN (hypertension)   . ITP (idiopathic thrombocytopenic purpura) 08/29/2013  . Leukocytosis, unspecified 09/05/2013  . Muscle cramp 11/16/2013  . Tachycardia    occ upon exersion, and when put on bp med    Past Surgical History:  Procedure Laterality Date  . ABDOMINAL HYSTERECTOMY    . ANTERIOR LAT LUMBAR FUSION N/A 03/28/2014   Procedure: Anterolateral decompression of L1 fracture with posterior segmental stabilization;  Surgeon: Kristeen Miss, MD;  Location: [facility];  Service: Neurosurgery;  Laterality: N/A;  Anterolateral decompression of Lumbar One fracture with posterior segmental stabilization  . CESAREAN SECTION     x2   . CHEST TUBE INSERTION Left 03/28/2014   Procedure: CHEST TUBE INSERTION;  Surgeon: Ivin Poot, MD;  Location: [facility];  Service: Thoracic;  Laterality: Left;  . INCISIONAL HERNIA REPAIR N/A 11/13/2014   Procedure: PRIMARY REPAIR INCARCERATED INCISIONAL HERNIA WITH PLACEMENT BIOLOGIC MESH;  Surgeon: Rolm Bookbinder, MD;  Location: [place];  Service: General;  Laterality: N/A;  . LAPAROTOMY Right 09   ovarian cyst  . TOTAL ABDOMINAL HYSTERECTOMY W/ BILATERAL SALPINGOOPHORECTOMY  97    Allergies: Patient has no known allergies.  Medications:  Current Facility-Administered Medications:  .  0.9 %  sodium chloride infusion, 250 mL, Intravenous, PRN, Stark Klein, MD .  acetaminophen (OFIRMEV) IV 1,000 mg, 1,000 mg, Intravenous, Q6H, Jose Persia, MD, Last Rate: 400 mL/hr at 01/08/21 0634, 1,000 mg at 01/08/21 0634 .  ceFEPIme (MAXIPIME) 2 g in sodium chloride 0.9 % 100 mL IVPB, 2 g, Intravenous, Q8H, Andrew Au, MD, Last Rate: 200 mL/hr at 01/08/21 1007, 2 g at 01/08/21 1007 .  fentaNYL (SUBLIMAZE) injection 12.5 mcg, 12.5 mcg, Intravenous, Q4H PRN, Jose Persia, MD, 12.5 mcg at 01/08/21 0115 .  ipratropium-albuterol (DUONEB) 0.5-2.5 (3) MG/3ML nebulizer solution 3 mL, 3 mL, Nebulization, Q6H PRN, Norm Parcel, PA-C, 3 mL at 01/04/21 1032 .  ketorolac (TORADOL) 15 MG/ML injection 15 mg, 15 mg, Intravenous, Q6H, Jose Persia, MD, 15 mg at 01/08/21 0648 .  lidocaine (LIDODERM) 5 % 1 patch, 1 patch, Transdermal, Q24H, Norm Parcel, PA-C, 1 patch at 01/08/21  [id number] .  ondansetron (ZOFRAN-ODT) disintegrating tablet 4 mg, 4 mg, Oral, Q6H PRN **OR** ondansetron (ZOFRAN) injection 4 mg, 4 mg, Intravenous, Q6H PRN, Stark Klein, MD, 4 mg at 01/04/21 1020 .  sodium chloride flush (NS) 0.9 % injection 3 mL, 3 mL, Intravenous, Q12H, Stark Klein, MD, 3 mL at 01/08/21 0949 .  sodium chloride flush (NS) 0.9 % injection 3 mL, 3 mL, Intravenous, PRN, Stark Klein, MD    Family  History  Problem Relation Age of Onset  . Hypertension Other   . Heart attack Other   . Leukemia Paternal Uncle        leukemia  . Heart attack Mother   . Hypertension Mother   . Heart disease Father     Social History   Socioeconomic History  . Marital status: Divorced    Spouse name: Not on file  . Number of children: Not on file  . Years of education: Not on file  . Highest education level: Not on file  Occupational History  . Occupation: Retired  . Occupation: Disabled  Tobacco Use  . Smoking status: Former Smoker    Packs/day: 1.00    Years: 15.00    Pack years: 15.00    Types: Cigarettes    Quit date: 09/15/2014    Years since quitting: 6.3  . Smokeless tobacco: Never Used  Vaping Use  . Vaping Use: Never used  Substance and Sexual Activity  . Alcohol use: Yes    Comment: rare  . Drug use: No  . Sexual activity: Not on file  Other Topics Concern  . Not on file  Social History Narrative   Current Social History 01/10/2020        Patient lives with daughter and daughter's fiance in a one level home. There are 3 steps with handrail up to the entrance the patient uses.       Patient's method of transportation is personal car. Patient no longer drives. Family member drives.      The highest level of education was high school diploma.      The patient currently retired and disabled.      Identified important Relationships are "My children and grandchildren (4 with one on the way)       Pets : 1 dog, 1 cat, and 2 birds.       Interests / Fun: Watch TV       Current Stressors: "Nothing really stresses me"       Religious / Personal Beliefs: "I believe in God and Jesus and know I'm going to heaven." ([place])       L. [place]atte, BSN, RN-BC       Social Determinants of Health   Financial Resource Strain: Not on file  Food Insecurity: Not on file  Transportation Needs: Not on file  Physical Activity: Not on file  Stress: Not on file  Social  Connections: Not on file    Review of Systems: A 12 point ROS discussed and pertinent positives are indicated in the HPI above.  All other systems are negative.  Review of Systems  Vital Signs: BP 129/82 (BP Location: Right Arm)   Pulse (!) 104   Temp 98.7 F (37.1 C) (Axillary)   Resp (!) 26   Ht 5' (1.524 m)   Wt 86.5 kg   SpO2 94%   BMI 37.24 kg/m   Physical Exam Constitutional:      Appearance: Normal appearance. She is not ill-appearing.  HENT:     Mouth/Throat:     Mouth: Mucous membranes are moist.     Pharynx: Oropharynx is clear.  Cardiovascular:     Rate and Rhythm: Normal rate and regular rhythm.     Heart sounds: Normal heart sounds.  Pulmonary:     Effort: No respiratory distress.     Breath sounds: No wheezing.     Comments: Diminished right BS Neurological:     General: No focal deficit present.     Mental Status: She is alert and oriented to person, place, and time.  Psychiatric:        Thought Content: Thought content normal.        Judgment: Judgment normal.     Imaging: CT Abdomen Pelvis Wo Contrast  Result Date: 01/02/2021 CLINICAL DATA:  Patient status post slip and fall today. EXAM: CT ABDOMEN AND PELVIS WITHOUT CONTRAST TECHNIQUE: Multidetector CT imaging of the abdomen and pelvis was performed following the standard protocol without IV contrast. COMPARISON:  CT abdomen and pelvis 11/13/2014. CT chest earlier today and 09/26/2020. FINDINGS: Lower chest: Basilar airspace disease and small right basilar pneumothorax or unchanged compared to the patient's chest CT earlier today. Hepatobiliary: Low attenuating lesion in the posterior right hepatic lobe compatible with hemangioma is unchanged since prior CT abdomen and pelvis. No other focal liver lesion. Multiple stones are seen in the gallbladder but there is no evidence of cholecystitis. The biliary tree appears normal. Pancreas: Unremarkable. No pancreatic ductal dilatation or surrounding  inflammatory changes. Spleen: Normal in size without focal abnormality. Adrenals/Urinary Tract: Adrenal glands are unremarkable. Kidneys are normal, without renal calculi, focal lesion, or hydronephrosis. Bladder is unremarkable. Stomach/Bowel: Stomach is within normal limits. No evidence of appendicitis. No evidence of bowel wall thickening, distention, or inflammatory changes. Vascular/Lymphatic: Aortic atherosclerosis. No enlarged abdominal or pelvic lymph nodes. The aorta is markedly tortuous with a straight right lateral orientation at the level of L2, unchanged. Reproductive: Status post hysterectomy. No adnexal masses. Other: Large ventral hernia is unchanged. Musculoskeletal: As seen on the prior exam, the patient is status post L1 corpectomy. There is extensive bony destructive change throughout the T12 vertebral body which appears new or worse compared to the patient's most recent chest CT. Marked subsidence of the corpectomy strut into the L2 vertebral body is unchanged. Remote compression fractures are seen at all remaining imaged thoracic and lumbar levels. IMPRESSION: Status post L1 corpectomy as seen on prior exams. Bony destructive change throughout the T12 vertebral body appears new or at least worse compared to the most recent comparison dated 09/26/2020. This finding could be due to infection, inflammatory change or possibly tumor. The appearance is not suggestive of fracture due to fall. MRI with and without contrast is the best next test for further evaluation. Remote compression fractures at all imaged thoracic and lumbar levels. Large ventral hernia. Gallstones without evidence of cholecystitis. Bibasilar airspace disease and small right basilar pneumothorax as seen on chest CT today. Electronically Signed   By: Inge Rise M.D.   On: 01/02/2021 20:03   CT Head Wo Contrast  Result Date: 01/02/2021 CLINICAL DATA:  70 year old female with fall. EXAM: CT HEAD WITHOUT CONTRAST CT CERVICAL  SPINE WITHOUT CONTRAST TECHNIQUE: Multidetector CT imaging of the head and cervical spine was performed following the standard protocol without intravenous contrast. Multiplanar CT image reconstructions of the cervical spine were also generated. COMPARISON:  Head CT dated 05/13/2020. FINDINGS: CT HEAD FINDINGS Brain: The ventricles and sulci appropriate size for  patient's age. The gray-white matter discrimination is preserved. There is no acute intracranial hemorrhage. No mass effect or midline shift. No extra-axial fluid collection. Vascular: No hyperdense vessel or unexpected calcification. Skull: Normal. Negative for fracture or focal lesion. Sinuses/Orbits: Mild mucoperiosteal thickening of paranasal sinuses. No air-fluid level. Mastoid air cells are clear. Other: None CT CERVICAL SPINE FINDINGS Alignment: No acute subluxation. Skull base and vertebrae: No acute fracture. Osteopenia. Soft tissues and spinal canal: No prevertebral fluid or swelling. No visible canal hematoma. Disc levels:  No acute findings. Upper chest: Mild emphysema. Minimal right apical pneumothorax. Please see report for the chest CT. Other: None IMPRESSION: 1. No acute intracranial pathology. 2. No acute/traumatic cervical spine pathology. 3. Minimal right apical pneumothorax. Please see report for the chest CT. Electronically Signed   By: Anner Crete M.D.   On: 01/02/2021 16:46   CT CHEST WO CONTRAST  Result Date: 01/07/2021 CLINICAL DATA:  Hemothorax.  Fall 01/02/2021 EXAM: CT CHEST WITHOUT CONTRAST TECHNIQUE: Multidetector CT imaging of the chest was performed following the standard protocol without IV contrast. COMPARISON:  Chest CT 01/02/2021 FINDINGS: Cardiovascular: Aortic atherosclerosis and tortuosity. Mild cardiomegaly. There are coronary artery calcifications. No significant pericardial fluid. Mediastinum/Nodes: No adenopathy. Unchanged appearance of bilateral thyroid nodules. Decompressed esophagus. Lungs/Pleura:  Previous pneumothorax has near completely resolved with only small focus of extrapleural gas at the anterior lung base. Increased volume of right pleural fluid from prior exam, heterogeneous density consistent with hemothorax. This is moderate to large in size. There is a large dependent component, with pleural fluid tracking medially adjacent to the mediastinum and anterior laterally in the lower thorax. Complete compressive atelectasis of the right lower lobe. Partial compressive atelectasis of the right middle and dependent right upper lobe. Small complex left pleural effusion has slightly increased from prior. Adjacent compressive atelectasis in the left lower lobe. Linear atelectasis in the lingula. Mild emphysema. Upper Abdomen: Low-density involving the posterior right lobe of the liver is stable from prior imaging. Unusual course of the aorta at the diaphragmatic hiatus is unchanged. Musculoskeletal: Destructive changes involving anterior inferior aspect of T12 appears similar to prior imaging with likely pathologic fracture through the anterior vertebral body. L1 corpectomy is partially included. Stable appearance of multiple thoracic compression fractures. Slight increased displacement of right posterior tenth rib fracture from prior. Fracture appears segmental with both posterior as well as central fracture components at the costovertebral junction. Right ninth rib fracture is unchanged in alignment. No other interval change in osseous structures. IMPRESSION: 1. Moderate to large right hemothorax. Previous right pneumothorax has near completely resolved with only small residual focus of air anteriorly. 2. Small complex left pleural effusion has slightly increased in size from prior exam. 3. Compressive atelectasis throughout the right lung includes complete lower lobe atelectasis. Scattered atelectasis throughout the left lung. 4. Destructive changes involving anterior inferior aspect of T12 vertebral  body, unchanged from recent imaging. Stable appearance of multiple additional thoracic compression fractures. 5. Slight increased displacement of right posterior tenth rib fracture from prior exam. Fracture appears segmental with both posterior as well as central fracture component at the costovertebral junction. Right ninth rib fracture is unchanged in alignment. Aortic Atherosclerosis (ICD10-I70.0) and Emphysema (ICD10-J43.9). Electronically Signed   By: Keith Rake M.D.   On: 01/07/2021 16:53   CT Chest Wo Contrast  Result Date: 01/02/2021 CLINICAL DATA:  Suspected rib fracture.  Fall. EXAM: CT CHEST WITHOUT CONTRAST TECHNIQUE: Multidetector CT imaging of the chest was performed following the standard  protocol without IV contrast. COMPARISON:  09/26/2020 FINDINGS: Cardiovascular: Tortuous aorta. No aneurysm. Scattered coronary artery and aortic calcifications. Heart is borderline in size. Mediastinum/Nodes: No mediastinal, hilar, or axillary adenopathy. Trachea and esophagus are unremarkable. Bilateral thyroid nodules, the largest 12 mm in the right thyroid lobe. Not clinically significant; no follow-up imaging recommended (ref: J Am Coll Radiol. 2015 Feb;12(2): 143-50). Lungs/Pleura: Linear areas of scarring or atelectasis in the mid and lower lungs bilaterally. No effusions. Small right pneumothorax noted in the lower chest. Upper Abdomen: Low-density lesion posteriorly in the liver measures 3.8 cm, similar to prior study. No acute findings. Musculoskeletal: Posterior right 9th and 10th rib fractures noted. No additional acute bony abnormality. Chest wall soft tissues are unremarkable. IMPRESSION: Posterior right 9th and 10th rib fractures. Tiny right basilar pneumothorax. Areas of atelectasis or scarring in the mid and lower lungs. Coronary artery disease. Aortic Atherosclerosis (ICD10-I70.0). Electronically Signed   By: Rolm Baptise M.D.   On: 01/02/2021 16:50   CT Cervical Spine Wo  Contrast  Result Date: 01/02/2021 CLINICAL DATA:  70 year old female with fall. EXAM: CT HEAD WITHOUT CONTRAST CT CERVICAL SPINE WITHOUT CONTRAST TECHNIQUE: Multidetector CT imaging of the head and cervical spine was performed following the standard protocol without intravenous contrast. Multiplanar CT image reconstructions of the cervical spine were also generated. COMPARISON:  Head CT dated 05/13/2020. FINDINGS: CT HEAD FINDINGS Brain: The ventricles and sulci appropriate size for patient's age. The gray-white matter discrimination is preserved. There is no acute intracranial hemorrhage. No mass effect or midline shift. No extra-axial fluid collection. Vascular: No hyperdense vessel or unexpected calcification. Skull: Normal. Negative for fracture or focal lesion. Sinuses/Orbits: Mild mucoperiosteal thickening of paranasal sinuses. No air-fluid level. Mastoid air cells are clear. Other: None CT CERVICAL SPINE FINDINGS Alignment: No acute subluxation. Skull base and vertebrae: No acute fracture. Osteopenia. Soft tissues and spinal canal: No prevertebral fluid or swelling. No visible canal hematoma. Disc levels:  No acute findings. Upper chest: Mild emphysema. Minimal right apical pneumothorax. Please see report for the chest CT. Other: None IMPRESSION: 1. No acute intracranial pathology. 2. No acute/traumatic cervical spine pathology. 3. Minimal right apical pneumothorax. Please see report for the chest CT. Electronically Signed   By: Anner Crete M.D.   On: 01/02/2021 16:46   MR THORACIC SPINE WO CONTRAST  Result Date: 01/03/2021 CLINICAL DATA:  [place].  Back pain. EXAM: MRI THORACIC SPINE WITHOUT CONTRAST TECHNIQUE: Multiplanar, multisequence MR imaging of the thoracic spine was performed. No intravenous contrast was administered. COMPARISON:  CT scan 01/02/2021 FINDINGS: Examination is limited by patient motion and the patient could not complete the examination. Alignment: Exaggerated thoracic kyphosis.  The thoracic vertebral bodies are grossly normally aligned. Vertebrae: As demonstrated on the CT scan the T12 vertebral body is partially destroyed. There is abnormal low T1 and high T2 and STIR signal intensity suspicious for tumor. No spinal canal compromise. I do not see any similar findings involving the other vertebral bodies. Remote compression fractures are noted in the thoracic spine and there are T4 and T8 hemangioma is noted. Cord:  Grossly normal thoracic spinal cord. Paraspinal and other soft tissues: No significant paraspinal findings. Disc levels: No obvious large thoracic disc protrusions or significant canal compromise. IMPRESSION: 1. Limited examination due to patient motion and the patient could not complete the examination. 2. The T12 vertebral body is partially destroyed and demonstrates abnormal signal intensity suspicious for tumor. No spinal canal compromise. 3. Remote compression fractures of the thoracic spine.  4. No obvious large thoracic disc protrusions or significant canal compromise. Electronically Signed   By: Marijo Sanes M.D.   On: 01/03/2021 17:32   DG CHEST PORT 1 VIEW  Result Date: 01/08/2021 CLINICAL DATA:  Hemothorax. EXAM: PORTABLE CHEST 1 VIEW COMPARISON:  CT 01/07/2021.  Chest x-ray 01/07/2021. FINDINGS: Cardiomegaly again noted without interim change. Persistent diffuse right lung and left base infiltrates/edema. Persistent bibasilar atelectasis. Persistent moderate right pleural effusion. No pneumothorax. Postsurgical changes thoracolumbar spine again noted. IMPRESSION: 1.  Cardiomegaly again noted. 2. Persistent diffuse right lung and left base infiltrates/edema. Persistent bibasilar atelectasis. Persistent moderate right pleural effusion. Electronically Signed   By: Marcello Moores  Register   On: 01/08/2021 05:10   DG Chest Port 1 View  Result Date: 01/07/2021 CLINICAL DATA:  Right-sided hemothorax EXAM: PORTABLE CHEST 1 VIEW COMPARISON:  January 06, 2021 FINDINGS: There  is persistent apparent elevation of the right hemidiaphragm with apparent pleural effusion on the right, similar in appearance. There is atelectasis with suspected superimposed airspace consolidation in the left base laterally. No new opacity evident. No pneumothorax appreciable. Stable cardiac enlargement. The pulmonary vascularity appears within normal limits. No adenopathy. No bone lesions. IMPRESSION: Persistent pleural fluid on the right with elevation of right hemidiaphragm, unchanged from 1 day prior. Atelectasis with suspected associated airspace consolidation in lateral left base. Stable cardiomegaly. No new opacity compared to 1 day prior. Electronically Signed   By: Lowella Grip III M.D.   On: 01/07/2021 08:41   DG Chest Port 1 View  Result Date: 01/06/2021 CLINICAL DATA:  Right hemothorax. EXAM: PORTABLE CHEST 1 VIEW COMPARISON:  01/05/2021 FINDINGS: Stable cardiac enlargement. Unchanged appearance of right pleural fluid collection. Asymmetric opacities within the right upper lobe and right lower lobe are unchanged. Similar appearance of left base atelectasis. IMPRESSION: 1. No change in aeration to the right upper lobe and right lower lobe. 2. Stable right pleural fluid collection. Electronically Signed   By: Kerby Moors M.D.   On: 01/06/2021 09:37   DG CHEST PORT 1 VIEW  Result Date: 01/05/2021 CLINICAL DATA:  Hemothorax on right. EXAM: PORTABLE CHEST 1 VIEW COMPARISON:  January 05, 2021 FINDINGS: The study is limited due to the low volume portable technique. The cardiomediastinal silhouette is stable. No pneumothorax. Mild atelectasis in the left base. Effusion and opacity on the right is more pronounced in the interval. Whether this represents true change or difference in positioning is unclear. IMPRESSION: 1. Effusion with underlying opacity on the right is more pronounced in the interval. Whether this represents true change or difference in positioning is unclear. No other changes.  Electronically Signed   By: Dorise Bullion III M.D   On: 01/05/2021 14:19   DG CHEST PORT 1 VIEW  Result Date: 01/05/2021 CLINICAL DATA:  Hemothorax. EXAM: PORTABLE CHEST 1 VIEW COMPARISON:  01/05/2021 FINDINGS: Stable cardiomediastinal contours. Bilateral pleural effusions are again noted overlying areas of atelectasis. Sharply demarcated lying along the right paratracheal stripe may reflect small residual right paramediastinal. IMPRESSION: 1. No change in bilateral pleural fluid collections with overlying atelectasis. 2. Suspect residual small right paramediastinal pneumothorax. Electronically Signed   By: Kerby Moors M.D.   On: 01/05/2021 11:14   DG CHEST PORT 1 VIEW  Result Date: 01/05/2021 CLINICAL DATA:  Evaluate pneumothorax. EXAM: PORTABLE CHEST 1 VIEW COMPARISON:  01/04/2021 FINDINGS: Stable cardiomediastinal contours. No pneumothorax. Right pleural effusion is again noted and appears increased from previous exam. Similar areas of platelike atelectasis within the left midlung noted. IMPRESSION:  Increase in volume of right pleural effusion. Electronically Signed   By: Kerby Moors M.D.   On: 01/05/2021 09:08   DG CHEST PORT 1 VIEW  Result Date: 01/04/2021 CLINICAL DATA:  Shortness of breath.  History of pneumothorax. EXAM: PORTABLE CHEST 1 VIEW COMPARISON:  Earlier today FINDINGS: Stable cardiomediastinal contours. Increased bilateral pleural effusions. Subsegmental atelectasis or scar within the perihilar left mid lung is again noted. Bandlike opacity within the right mid and right upper lung is similar to previous exam and is favored to represent fluid tracking along the fissure. No visible pneumothorax. IMPRESSION: 1. Increased bilateral pleural effusions. 2. Stable bandlike opacity within the right mid and right upper lung. 3. No pneumothorax. Electronically Signed   By: Kerby Moors M.D.   On: 01/04/2021 18:31   DG Chest Port 1 View  Result Date: 01/04/2021 CLINICAL DATA:   Pneumothorax, rib fractures EXAM: PORTABLE CHEST 1 VIEW COMPARISON:  Radiograph 01/03/2021, CT 01/02/2021 FINDINGS: Stable enlarged cardiac silhouette. Layering bilateral effusions and fluid tracking along the fissures, increasing from comparison exam. Diffuse pulmonary vascular congestion with hazy opacities in the perihilar lower lung predominance. Lower thoracic postsurgical changes are unchanged from prior. Additional multilevel degenerative changes in the spine shoulders. IMPRESSION: 1. Increasing pleural effusions with developing vascular congestion and hazy opacities which are suggestive of worsening atelectasis and possible developing edema. 2. Stable cardiomegaly. 3. No visible pneumothorax. 4. Known right rib fractures are better characterized on cross-sectional imaging. Electronically Signed   By: Lovena Le M.D.   On: 01/04/2021 06:33   DG Chest Port 1 View  Result Date: 01/03/2021 CLINICAL DATA:  Acute respiratory distress EXAM: PORTABLE CHEST 1 VIEW COMPARISON:  01/03/2021 FINDINGS: Cardiac shadow is enlarged but stable. Patchy atelectatic changes are noted within both lungs which has progressed in the interval from the film earlier in the day. Postsurgical changes in the lower thoracic spine are noted. Small right-sided pleural effusion is seen. IMPRESSION: Progressive atelectasis when compare with the earlier film. Small right pleural effusion. Electronically Signed   By: Inez Catalina M.D.   On: 01/03/2021 21:28   DG CHEST PORT 1 VIEW  Result Date: 01/03/2021 CLINICAL DATA:  Rib fractures after fall in bathroom yesterday EXAM: PORTABLE CHEST 1 VIEW COMPARISON:  Chest CT 01/02/2021 FINDINGS: Low volume chest with hazy and streaky density at the bases. Cardiopericardial enlargement. No visible effusion or pneumothorax. Osseous structures are better depicted by prior CT. IMPRESSION: Extensive atelectasis that is progressed from yesterday. Electronically Signed   By: Monte Fantasia M.D.   On:  01/03/2021 07:23   VAS [place] LOWER EXTREMITY VENOUS (DVT)  Result Date: 01/08/2021  Lower Venous DVT Study Patient Name:  [name]  Date of Exam:   01/08/2021 Medical Rec #: [id number]         Accession #:    [id number] Date of Birth: August 12, 1951         Patient Gender: F Patient Age:   70Y Exam Location:  [facility] Procedure:      VAS [place] LOWER EXTREMITY VENOUS (DVT) Referring Phys: [id number] CAROLYN GUILLOUD --------------------------------------------------------------------------------  Indications: Left leg pain. Other Indications: History of chronic respiratory failure. Limitations: Bandages. Comparison Study: Last venous study on file 06/10/17 - negative Performing Technologist: Velva Harman Sturdivant RDMS, RVT  Examination Guidelines: A complete evaluation includes B-mode imaging, spectral Doppler, color Doppler, and power Doppler as needed of all accessible portions of each vessel. Bilateral testing is considered an integral part of a complete examination. Limited  examinations for reoccurring indications may be performed as noted. The reflux portion of the exam is performed with the patient in reverse Trendelenburg.  +-----+---------------+---------+-----------+----------+--------------+ RIGHTCompressibilityPhasicitySpontaneityPropertiesThrombus Aging +-----+---------------+---------+-----------+----------+--------------+ CFV  Full           Yes      Yes                                 +-----+---------------+---------+-----------+----------+--------------+ SFJ  Full                                                        +-----+---------------+---------+-----------+----------+--------------+   +---------+---------------+---------+-----------+----------+-------------------+ LEFT     CompressibilityPhasicitySpontaneityPropertiesThrombus Aging      +---------+---------------+---------+-----------+----------+-------------------+ CFV      Full           Yes      Yes                                       +---------+---------------+---------+-----------+----------+-------------------+ SFJ      Full                                                             +---------+---------------+---------+-----------+----------+-------------------+ FV Prox  Full                                                             +---------+---------------+---------+-----------+----------+-------------------+ FV Mid   Full                                                             +---------+---------------+---------+-----------+----------+-------------------+ FV DistalFull                                                             +---------+---------------+---------+-----------+----------+-------------------+ PFV      Full                                                             +---------+---------------+---------+-----------+----------+-------------------+ POP      Full           Yes      Yes                                      +---------+---------------+---------+-----------+----------+-------------------+  PERO                                                  not evaluated due                                                         to bandages         +---------+---------------+---------+-----------+----------+-------------------+ Soleal                                                not evaluated due                                                         to bandages         +---------+---------------+---------+-----------+----------+-------------------+    Summary: RIGHT: - No evidence of common femoral vein obstruction.  LEFT: - There is no evidence of deep vein thrombosis in the lower extremity. However, portions of this examination were limited- see technologist comments above.  *See table(s) above for measurements and observations.    Preliminary     Labs:  CBC: Recent Labs    01/06/21 1630 01/07/21 0618  01/07/21 1654 01/08/21 0515  WBC 16.7* 14.9* 26.0* 19.9*  HGB 8.5* 7.9* 8.4* 7.6*  HCT 26.6* 24.9* 26.8* 24.3*  PLT 111* 136* 192 124*    COAGS: Recent Labs    04/19/20 0341 05/13/20 2149 06/29/20 1427 09/26/20 1337 01/08/21 0650  INR  --  1.1 1.0 1.1 1.2  APTT 31 24 30  33  --     BMP: Recent Labs    05/14/20 0614 05/15/20 0058 05/16/20 0110 05/17/20 0631 06/29/20 1427 01/05/21 0215 01/06/21 0620 01/07/21 0618 01/08/21 0515  NA 135 138 140 142   < > 137 137 140 142  K 3.9 3.7 3.8 3.9   < > 4.7 4.3 3.6 3.4*  CL 92* 96* 97* 99   < > 93* 90* 90* 90*  CO2 34* 33* 35* 35*   < > 37* 40* 40* 38*  GLUCOSE 96 136* 95 91   < > 124* 108* 102* 115*  BUN 35* 21 9 7*   < > 27* 33* 30* 51*  CALCIUM 8.5* 8.7* 9.1 9.3   < > 9.0 9.0 9.2 8.8*  CREATININE 1.87* 0.85 0.55 0.56   < > 0.61 0.50 0.54 0.84  GFRNONAA 27* >60 >60 >60   < > >60 >60 >60 >60  GFRAA 32* >60 >60 >60  --   --   --   --   --    < > = values in this interval not displayed.    LIVER FUNCTION TESTS: Recent Labs    07/08/20 0336 09/26/20 1337 09/27/20 0404 01/04/21 0049  BILITOT 1.8* 0.9 1.0 0.8  AST 18 24 17 23   ALT 21 22 19  36  ALKPHOS 60 114 91 90  PROT 5.5* 6.4* 5.7* 6.2*  ALBUMIN 3.4* 3.0* 2.5* 3.5    TUMOR MARKERS: No results for input(s): AFPTM, CEA, CA199, CHROMGRNA in the last 8760 hours.  Assessment and Plan: Right effusion/hemothorax For image guided chest tube placement for evacuation. Able to discuss with pt and her family. Risks and benefits discussed with the patient including bleeding, infection, damage to adjacent structures, and possible need for exchange/upsize if not effective,  All of the patient's questions were answered, patient is agreeable to proceed.     Thank you for this interesting consult.  I greatly enjoyed meeting [name] and look forward to participating in their care.  A copy of this report was sent to the requesting provider on this  date.  Electronically Signed: Ascencion Dike, PA-C 01/08/2021, 11:39 AM   I spent a total of 20 minutes in face to face in clinical consultation, greater than 50% of which was counseling/coordinating care for right chest tube placement

## 2021-01-09 ENCOUNTER — Inpatient Hospital Stay (HOSPITAL_COMMUNITY): Payer: Medicare PPO

## 2021-01-09 DIAGNOSIS — G4733 Obstructive sleep apnea (adult) (pediatric): Secondary | ICD-10-CM | POA: Diagnosis not present

## 2021-01-09 DIAGNOSIS — J942 Hemothorax: Secondary | ICD-10-CM | POA: Diagnosis not present

## 2021-01-09 DIAGNOSIS — J9622 Acute and chronic respiratory failure with hypercapnia: Secondary | ICD-10-CM | POA: Diagnosis not present

## 2021-01-09 DIAGNOSIS — S2241XG Multiple fractures of ribs, right side, subsequent encounter for fracture with delayed healing: Secondary | ICD-10-CM | POA: Diagnosis not present

## 2021-01-09 DIAGNOSIS — S270XXA Traumatic pneumothorax, initial encounter: Secondary | ICD-10-CM | POA: Diagnosis not present

## 2021-01-09 DIAGNOSIS — J9621 Acute and chronic respiratory failure with hypoxia: Secondary | ICD-10-CM | POA: Diagnosis not present

## 2021-01-09 LAB — CBC
HCT: 27.4 % — ABNORMAL LOW (ref 36.0–46.0)
HCT: 24.4 % — ABNORMAL LOW (ref 36.0–46.0)
Hemoglobin: 8.6 g/dL — ABNORMAL LOW (ref 12.0–15.0)
Hemoglobin: 7.6 g/dL — ABNORMAL LOW (ref 12.0–15.0)
MCH: 28.8 pg (ref 26.0–34.0)
MCH: 28.8 pg (ref 26.0–34.0)
MCHC: 31.4 g/dL (ref 30.0–36.0)
MCHC: 31.1 g/dL (ref 30.0–36.0)
MCV: 91.6 fL (ref 80.0–100.0)
MCV: 92.4 fL (ref 80.0–100.0)
Platelets: 122 10*3/uL — ABNORMAL LOW (ref 150–400)
Platelets: 126 10*3/uL — ABNORMAL LOW (ref 150–400)
RBC: 2.99 MIL/uL — ABNORMAL LOW (ref 3.87–5.11)
RBC: 2.64 MIL/uL — ABNORMAL LOW (ref 3.87–5.11)
RDW: 16.8 % — ABNORMAL HIGH (ref 11.5–15.5)
RDW: 17.3 % — ABNORMAL HIGH (ref 11.5–15.5)
WBC: 18.5 10*3/uL — ABNORMAL HIGH (ref 4.0–10.5)
WBC: 22.3 10*3/uL — ABNORMAL HIGH (ref 4.0–10.5)
nRBC: 0.4 % — ABNORMAL HIGH (ref 0.0–0.2)
nRBC: 0.2 % (ref 0.0–0.2)

## 2021-01-09 LAB — URINALYSIS, ROUTINE W REFLEX MICROSCOPIC
Bilirubin Urine: NEGATIVE
Glucose, UA: NEGATIVE mg/dL
Ketones, ur: 5 mg/dL — AB
Nitrite: NEGATIVE
Protein, ur: NEGATIVE mg/dL
Specific Gravity, Urine: 1.026 (ref 1.005–1.030)
pH: 6 (ref 5.0–8.0)

## 2021-01-09 LAB — BASIC METABOLIC PANEL
Anion gap: 8 (ref 5–15)
BUN: 52 mg/dL — ABNORMAL HIGH (ref 8–23)
CO2: 37 mmol/L — ABNORMAL HIGH (ref 22–32)
Calcium: 8.7 mg/dL — ABNORMAL LOW (ref 8.9–10.3)
Chloride: 99 mmol/L (ref 98–111)
Creatinine, Ser: 0.76 mg/dL (ref 0.44–1.00)
GFR, Estimated: >60 mL/min (ref >60)
Glucose, Bld: 94 mg/dL (ref 70–99)
Potassium: 3.3 mmol/L — ABNORMAL LOW (ref 3.5–5.1)
Sodium: 144 mmol/L (ref 135–145)

## 2021-01-09 LAB — GLUCOSE, CAPILLARY
Glucose-Capillary: 97 mg/dL (ref 70–99)
Glucose-Capillary: 95 mg/dL (ref 70–99)

## 2021-01-09 LAB — TYPE AND SCREEN
ABO/RH(D): A POS
Antibody Screen: NEGATIVE
Unit division: 0

## 2021-01-09 LAB — BPAM RBC
Blood Product Expiration Date: 202205232359
ISSUE DATE / TIME: 202204262058
Unit Type and Rh: 6200

## 2021-01-09 LAB — PATHOLOGIST SMEAR REVIEW

## 2021-01-09 MED ORDER — LACTATED RINGERS IV SOLN: INTRAVENOUS | Status: AC

## 2021-01-09 MED ORDER — POTASSIUM CHLORIDE 10 MEQ/100ML IV SOLN
10.0000 meq | INTRAVENOUS | Status: AC
  Administered 2021-01-09 (×6): 10 meq via INTRAVENOUS
  Filled 2021-01-09 (×6): qty 100

## 2021-01-09 MED ORDER — CEFDINIR 300 MG PO CAPS
300.0000 mg | ORAL_CAPSULE | Freq: Two times a day (BID) | ORAL | Status: AC
  Administered 2021-01-09 – 2021-01-12 (×8): 300 mg via ORAL
  Filled 2021-01-09 (×8): qty 1

## 2021-01-09 NOTE — Progress Notes (Incomplete)
purewick removed due to pt having almost constant bowel incontinence.  Not liquid, but soft

## 2021-01-09 NOTE — Progress Notes (Signed)
NAME:  [name], MRN:  [id number], DOB:  1951-08-24, LOS: 5 ADMISSION DATE:  01/02/2021, CONSULTATION DATE:  01/09/21 REFERRING MD: Graciella Freer CHIEF COMPLAINT:  Encephalopathy   History of Present Illness:  70 year old with chronic hypoxemic and hypercarbic respiratory failure in setting of presumed OHS/OSA who was admitted as a trauma 4/20 after fall with rib fractures.  H&P reviewed.  Daily progress note reviewed.  Patient mechanical fall at home.  She fell, backwards, hit the side of the bathtub with her right chest.  Had pain there.  Went to ED.  Chest x-ray on my interpretation showed rib fractures on the right, no pneumothorax appreciated.  CT chest was obtained as part of trauma protocol which showed a tiny anterior pneumothorax.  She was moved to trauma service.  Eventually transferred to the internal medicine teaching service.  Review of labs indicate stable chemistries with elevated bicarb as high as 41.  CBC is reviewed with mild leukocytosis, stable anemia, relatively stable thrombocytopenia.  Of note she has a history of ITP.  On the afternoon of 4/22 she became progressively encephalopathic.  ABG was obtained which revealed a pH of 718 with a PCO2 of 114.  She was placed on BiPAP. Mental status improved.  Pertinent  Medical History  OHS/OSA  Significant Hospital Events: Including procedures, antibiotic start and stop dates in addition to other pertinent events   . 4/20 admitted . 4/22, BiPAP improved MS after PCO2 was 114 . 4/23, worsened effusion, ~800cc dark blood from R chest thora, very difficult to position patient due to habitus and pain . 4/25 morning desaturated to 50% off bipap  CT chest 4/25>>>1. Moderate to large right hemothorax. Previous right pneumothorax has near completely resolved with only small residual focus of air anteriorly. 2. Small complex left pleural effusion has slightly increased in size from prior exam. 3. Compressive atelectasis throughout the  right lung includes complete lower lobe atelectasis. Scattered atelectasis throughout the left lung. 4. Destructive changes involving anterior inferior aspect of T12 vertebral body, unchanged from recent imaging. Stable appearance of multiple additional thoracic compression fractures. 5. Slight increased displacement of right posterior tenth rib fracture from prior exam. Fracture appears segmental with both posterior as well as central fracture component at the costovertebral junction. Right ninth rib fracture is unchanged in alignment.  Interim History / Subjective:  On bipap overnight. 1200 blood out from chest tube. Received 1 unit prbc. This morning dramtically improved. Awake, alert, asking for food and when her family will be in to visit.   Objective   Blood pressure 111/60, pulse 94, temperature 98 F (36.7 C), temperature source Oral, resp. rate 18, height 5' (1.524 m), weight 86.5 kg, SpO2 99 %.    FiO2 (%):  [40 %] 40 %   Intake/Output Summary (Last 24 hours) at 01/09/2021 0921 Last data filed at 01/09/2021 0641 Gross per 24 hour  Intake 637.5 ml  Output 2300 ml  Net -1662.5 ml   Filed Weights   01/02/21 1550 01/03/21 2316  Weight: 89.6 kg 86.5 kg    Examination: Constitutional: obese, chronically ill appearing. No distress Ears, nose, mouth, and throat: dry mucus membranes, skin breakdown on forehead and chin from mask Cardiovascular: RRR, no mrg Respiratory: diminished breath sounds, nonlabored, right sided chest tube with sanguinous drainage Gastrointestinal: Soft, nontender Neurologic: awake, alert, conversant Ext: chronic lymphedema, wrapped BLE    Labs/imaging that I have personally reviewed   Chest xray 4/27 shows improved right airspace opacities and chest tube  in adequate position  Resolved Hospital Problem list   n/a  Assessment & Plan:  # Acute on chronic hypoxemic and hypercarbic respiratory failure: In setting of fall, rib fractures, splinting,  hypoventilation, R hemothorax and L complicated effusion. Improving with drainage of hemothorax. Suspect underlying untreated OSA.  # Complicated by delayed onset R hemothorax post partial evacuation with thoracentesis 4/23.  Received 1 unit plts 4/23. S/p pigtail drainage catheter 4/26 with 1200cc output. Improvement of respiraotry status. Has received 1 unit prbc for anemia.  # Hx ITP, class 2 obesity, OHS, profound deconditioning # T12 lesion question tumor vs. Infection- CT directed biopsy pending respiratory status Acute blood loss anemia secondary to hemothorax  Maintain chest tube to suction. Please notify PCCM if there is hypotension, tachycardia, worsening hypoxemia acutely, or more than 50cc of chest tube output in an hour. She is not terribly active at baseline. She says she was supposed to be wearing a CPAP mask at home but she couldn't tolerate it so they came and picked it up. Would continue nocturnal NIPPV for now, but ok to leave off during the day.  Pulmonary will follow.   Lenice Llamas, MD [facility] [place]

## 2021-01-09 NOTE — Progress Notes (Signed)
Chief Complaint: Patient was seen today for right chest tube  Supervising Physician: Ruel Favors  Patient Status: [facility] - In-pt  Subjective: S/p (R)chest tube yesterday for hemothorax/effusion Feeling much better today. Off BiPap, on [place] O2 Sitting up in bed about to eat breakfast Some soreness at drain site as expected.  Objective: Physical Exam: BP 111/60 (BP Location: Left Arm)   Pulse 94   Temp 98 F (36.7 C) (Oral)   Resp 18   Ht 5' (1.524 m)   Wt 86.5 kg   SpO2 99%   BMI 37.24 kg/m  (R)chest drain intact, site clean. NT Output bloody, pleurevac nearly full. No air leak   Current Facility-Administered Medications:  .  0.9 %  sodium chloride infusion, 250 mL, Intravenous, PRN, Almond Lint, MD .  acetaminophen (OFIRMEV) IV 1,000 mg, 1,000 mg, Intravenous, Q6H, Verdene Lennert, MD, Last Rate: 400 mL/hr at 01/09/21 1000, 1,000 mg at 01/09/21 1000 .  ceFEPIme (MAXIPIME) 2 g in sodium chloride 0.9 % 100 mL IVPB, 2 g, Intravenous, Q8H, Remo Lipps, MD, Last Rate: 200 mL/hr at 01/09/21 0553, 2 g at 01/09/21 0553 .  fentaNYL (SUBLIMAZE) injection 12.5 mcg, 12.5 mcg, Intravenous, Q4H PRN, Verdene Lennert, MD, 12.5 mcg at 01/08/21 0115 .  ipratropium-albuterol (DUONEB) 0.5-2.5 (3) MG/3ML nebulizer solution 3 mL, 3 mL, Nebulization, Q6H PRN, Juliet Rude, PA-C, 3 mL at 01/04/21 1032 .  ketorolac (TORADOL) 15 MG/ML injection 15 mg, 15 mg, Intravenous, Q6H, Verdene Lennert, MD, 15 mg at 01/09/21 0548 .  lactated ringers infusion, , Intravenous, Continuous, Remo Lipps, MD .  lidocaine (LIDODERM) 5 % 1 patch, 1 patch, Transdermal, Q24H, Juliet Rude, PA-C, 1 patch at 01/09/21 [phone] .  ondansetron (ZOFRAN-ODT) disintegrating tablet 4 mg, 4 mg, Oral, Q6H PRN **OR** ondansetron (ZOFRAN) injection 4 mg, 4 mg, Intravenous, Q6H PRN, Almond Lint, MD, 4 mg at 01/04/21 1020 .  potassium chloride 10 mEq in 100 mL IVPB, 10 mEq, Intravenous, Q1 Hr x 6, Remo Lipps, MD, Last  Rate: 100 mL/hr at 01/09/21 0907, 10 mEq at 01/09/21 0907 .  sodium chloride flush (NS) 0.9 % injection 3 mL, 3 mL, Intravenous, Q12H, Almond Lint, MD, 3 mL at 01/08/21 2145 .  sodium chloride flush (NS) 0.9 % injection 3 mL, 3 mL, Intravenous, PRN, Almond Lint, MD  Labs: CBC Recent Labs    01/08/21 1800 01/09/21 0533  WBC 21.0* 18.5*  HGB 7.0* 8.6*  HCT 22.3* 27.4*  PLT 125* 122*   BMET Recent Labs    01/08/21 0515 01/09/21 0533  NA 142 144  K 3.4* 3.3*  CL 90* 99  CO2 38* 37*  GLUCOSE 115* 94  BUN 51* 52*  CREATININE 0.84 0.76  CALCIUM 8.8* 8.7*   LFT No results for input(s): PROT, ALBUMIN, AST, ALT, ALKPHOS, BILITOT, BILIDIR, IBILI, LIPASE in the last 72 hours. PT/INR Recent Labs    01/08/21 0650  LABPROT 14.9  INR 1.2     Studies/Results: CT CHEST WO CONTRAST  Result Date: 01/07/2021 CLINICAL DATA:  Hemothorax.  Fall 01/02/2021 EXAM: CT CHEST WITHOUT CONTRAST TECHNIQUE: Multidetector CT imaging of the chest was performed following the standard protocol without IV contrast. COMPARISON:  Chest CT 01/02/2021 FINDINGS: Cardiovascular: Aortic atherosclerosis and tortuosity. Mild cardiomegaly. There are coronary artery calcifications. No significant pericardial fluid. Mediastinum/Nodes: No adenopathy. Unchanged appearance of bilateral thyroid nodules. Decompressed esophagus. Lungs/Pleura: Previous pneumothorax has near completely resolved with only small focus of extrapleural gas at the  anterior lung base. Increased volume of right pleural fluid from prior exam, heterogeneous density consistent with hemothorax. This is moderate to large in size. There is a large dependent component, with pleural fluid tracking medially adjacent to the mediastinum and anterior laterally in the lower thorax. Complete compressive atelectasis of the right lower lobe. Partial compressive atelectasis of the right middle and dependent right upper lobe. Small complex left pleural effusion has  slightly increased from prior. Adjacent compressive atelectasis in the left lower lobe. Linear atelectasis in the lingula. Mild emphysema. Upper Abdomen: Low-density involving the posterior right lobe of the liver is stable from prior imaging. Unusual course of the aorta at the diaphragmatic hiatus is unchanged. Musculoskeletal: Destructive changes involving anterior inferior aspect of T12 appears similar to prior imaging with likely pathologic fracture through the anterior vertebral body. L1 corpectomy is partially included. Stable appearance of multiple thoracic compression fractures. Slight increased displacement of right posterior tenth rib fracture from prior. Fracture appears segmental with both posterior as well as central fracture components at the costovertebral junction. Right ninth rib fracture is unchanged in alignment. No other interval change in osseous structures. IMPRESSION: 1. Moderate to large right hemothorax. Previous right pneumothorax has near completely resolved with only small residual focus of air anteriorly. 2. Small complex left pleural effusion has slightly increased in size from prior exam. 3. Compressive atelectasis throughout the right lung includes complete lower lobe atelectasis. Scattered atelectasis throughout the left lung. 4. Destructive changes involving anterior inferior aspect of T12 vertebral body, unchanged from recent imaging. Stable appearance of multiple additional thoracic compression fractures. 5. Slight increased displacement of right posterior tenth rib fracture from prior exam. Fracture appears segmental with both posterior as well as central fracture component at the costovertebral junction. Right ninth rib fracture is unchanged in alignment. Aortic Atherosclerosis (ICD10-I70.0) and Emphysema (ICD10-J43.9). Electronically Signed   By: Keith Rake M.D.   On: 01/07/2021 16:53   DG CHEST PORT 1 VIEW  Result Date: 01/09/2021 CLINICAL DATA:  Shortness of breath.   Chest tube. EXAM: PORTABLE CHEST 1 VIEW COMPARISON:  01/08/2021. FINDINGS: Right chest tube noted in good anatomic position. Interim resolution of right pleural effusion. No pneumothorax. Persistent bibasilar atelectasis. Left base infiltrate cannot be excluded. Small left pleural effusion noted. Heart size stable. IMPRESSION: 1. Right chest tube noted in good anatomic position. Interim resolution of right pleural effusion. No pneumothorax. 2. Persistent bibasilar atelectasis. Left base infiltrate cannot be excluded. Small left pleural effusion. Electronically Signed   By: Marcello Moores  Register   On: 01/09/2021 07:56   DG CHEST PORT 1 VIEW  Result Date: 01/08/2021 CLINICAL DATA:  Hemothorax. EXAM: PORTABLE CHEST 1 VIEW COMPARISON:  CT 01/07/2021.  Chest x-ray 01/07/2021. FINDINGS: Cardiomegaly again noted without interim change. Persistent diffuse right lung and left base infiltrates/edema. Persistent bibasilar atelectasis. Persistent moderate right pleural effusion. No pneumothorax. Postsurgical changes thoracolumbar spine again noted. IMPRESSION: 1.  Cardiomegaly again noted. 2. Persistent diffuse right lung and left base infiltrates/edema. Persistent bibasilar atelectasis. Persistent moderate right pleural effusion. Electronically Signed   By: Marcello Moores  Register   On: 01/08/2021 05:10   CT IMAGE GUIDED DRAINAGE BY PERCUTANEOUS CATHETER  Result Date: 01/08/2021 INDICATION: 70 year old woman with right hemothorax presents today measure radiology for chest tube placement. EXAM: CT GUIDED DRAINAGE OF RIGHT HEMOTHORAX MEDICATIONS: The patient is currently admitted to the hospital and receiving intravenous antibiotics. The antibiotics were administered within an appropriate time frame prior to the initiation of the procedure. ANESTHESIA/SEDATION: 3 mg IV  Versed 150 mcg IV Fentanyl Moderate Sedation Time:  30 minutes The patient was continuously monitored during the procedure by the interventional radiology nurse  under my direct supervision. COMPLICATIONS: None immediate. TECHNIQUE: Informed written consent was obtained from the patient and patient's family after a thorough discussion of the procedural risks, benefits and alternatives. All questions were addressed. Maximal Sterile Barrier Technique was utilized including caps, mask, sterile gowns, sterile gloves, sterile drape, hand hygiene and skin antiseptic. A timeout was performed prior to the initiation of the procedure. PROCEDURE: The right lateral chest wall was prepped with chlorhexidine in a sterile fashion, and a sterile drape was applied covering the operative field. A sterile gown and sterile gloves were used for the procedure. Local anesthesia was provided with 1% Lidocaine. Utilizing CT guidance, 22 gauge spinal needle was advanced to the right pleural surface. The tract was anesthetized with lidocaine. The right pleural space was entered with a 17 gauge introducer needle utilizing CT guidance. The needle was exchanged for a 12 [place] multipurpose pigtail drain over 0.035 inch guidewire. Drain was secured to skin with suture and connected to Pleur-Evac set at -20 cm H2O. IMPRESSION: 25 French multipurpose pigtail drain placed in right hemothorax utilizing CT guidance. Electronically Signed   By: Miachel Roux M.D.   On: 01/08/2021 17:26   VAS [place] LOWER EXTREMITY VENOUS (DVT)  Result Date: 01/08/2021  Lower Venous DVT Study Patient Name:  [name]  Date of Exam:   01/08/2021 Medical Rec #: [id number]         Accession #:    [id number] Date of Birth: 07/24/1951         Patient Gender: F Patient Age:   70Y Exam Location:  [facility] Procedure:      VAS [place] LOWER EXTREMITY VENOUS (DVT) Referring Phys: [id number] CAROLYN GUILLOUD --------------------------------------------------------------------------------  Indications: Left leg pain. Other Indications: History of chronic respiratory failure. Limitations: Bandages. Comparison Study: Last venous study  on file 06/10/17 - negative Performing Technologist: Velva Harman Sturdivant RDMS, RVT  Examination Guidelines: A complete evaluation includes B-mode imaging, spectral Doppler, color Doppler, and power Doppler as needed of all accessible portions of each vessel. Bilateral testing is considered an integral part of a complete examination. Limited examinations for reoccurring indications may be performed as noted. The reflux portion of the exam is performed with the patient in reverse Trendelenburg.  +-----+---------------+---------+-----------+----------+--------------+ RIGHTCompressibilityPhasicitySpontaneityPropertiesThrombus Aging +-----+---------------+---------+-----------+----------+--------------+ CFV  Full           Yes      Yes                                 +-----+---------------+---------+-----------+----------+--------------+ SFJ  Full                                                        +-----+---------------+---------+-----------+----------+--------------+   +---------+---------------+---------+-----------+----------+-------------------+ LEFT     CompressibilityPhasicitySpontaneityPropertiesThrombus Aging      +---------+---------------+---------+-----------+----------+-------------------+ CFV      Full           Yes      Yes                                      +---------+---------------+---------+-----------+----------+-------------------+  SFJ      Full                                                             +---------+---------------+---------+-----------+----------+-------------------+ FV Prox  Full                                                             +---------+---------------+---------+-----------+----------+-------------------+ FV Mid   Full                                                             +---------+---------------+---------+-----------+----------+-------------------+ FV DistalFull                                                              +---------+---------------+---------+-----------+----------+-------------------+ PFV      Full                                                             +---------+---------------+---------+-----------+----------+-------------------+ POP      Full           Yes      Yes                                      +---------+---------------+---------+-----------+----------+-------------------+ PERO                                                  not evaluated due                                                         to bandages         +---------+---------------+---------+-----------+----------+-------------------+ Soleal                                                not evaluated due  to bandages         +---------+---------------+---------+-----------+----------+-------------------+     Summary: RIGHT: - No evidence of common femoral vein obstruction.  LEFT: - There is no evidence of deep vein thrombosis in the lower extremity. However, portions of this examination were limited- see technologist comments above.  *See table(s) above for measurements and observations. Electronically signed by Deitra Mayo MD on 01/08/2021 at 7:03:54 PM.    Final     Assessment/Plan: S/p (R)chest tube for hemothorax CXR much improved IR cont to follow.    LOS: 5 days   I spent a total of 15 minutes in face to face in clinical consultation, greater than 50% of which was counseling/coordinating care for right chest drain  Ascencion Dike PA-C 01/09/2021 10:03 AM

## 2021-01-09 NOTE — Progress Notes (Signed)
Subjective:   [name] states that she is feeling better this morning. States the chest tube is not terrible painful, though the nurse notes she has been asking for pain medications. Denies any new symptoms, asks for water.   Nurse removed BiPAP for oral care, patient maintained saturations on her home 4L by [place]. Plan for trial of BiPAP today.  Objective:  Vital signs in last 24 hours: Vitals:   01/09/21 0000 01/09/21 0024 01/09/21 0255 01/09/21 0400  BP: 126/62 124/62  104/61  Pulse: 97  96 94  Resp: 20  (!) 21 20  Temp:    97.9 F (36.6 C)  TempSrc:    Axillary  SpO2: 98%   98%  Weight:      Height:       Physical Exam Vitals and nursing note reviewed.  Constitutional: appears more comfortable Head: atraumatic Cardiovascular: regular rate and rhythm, normal heart sounds Pulmonary: on BiPAP, coarse breath sounds, fair tidal volumes Skin: warm and dry, healing abrasion on chin reportedly from cervical collar, contusion also noted on forehead from BiPAP mask, bilateral lower extremities with unna boots on Neurological: alert, no focal deficit  Assessment/Plan:  Ms. Hosea is a 70 y/o F with hx of chronic respiratory failure on 3-4L chronically, morbid obesity, HFpEF, chronic ITP, chronic leg wounds who presents with rib fractures after mechanical fall. Acute on chronic hypercapneic and hypoxic respiratory failure, also found to have hemothorax.  Principal Problem:   Pneumothorax Active Problems:   Pressure injury of skin   Multiple closed fractures of ribs of right side   Fall against object   Acute respiratory distress   Rib fractures   Hemothorax on right   Pleural effusion  Acute on chronic hypercapneic and hypoxic respiratory failure Ribs fracture due to mechanical fall Traumatic hemothorax in the setting of chronic ITP Symptomatic anemia Rib fractures and hemothorax after mechanical fall. Home oxygen requirement of 3 to 4 L of oxygen at baseline.  Clinical  course has been complicated by worsening hypercarbia and development of hemothorax with 800 cc drained on 4/23.  Pigtail chest tube placed by IR on 4/27, had drainage of 1400cc of dark blood soon after. Overnight has had 200cc more, so bleed may be slowing down. CXR this morning much improved compared to prior. 1 unit pRBCs infused last night for hgb of 7.0 and tachycardia, pulse has improved. Symptomatically feeling better as well.  - PCCM following; greatly appreciate recommendations - Continue BiPAP. Trial off BiPAP today - Chest x-ray and ABG if worsening respiratory status develops - daily CBC - Multimodal pain control with lidoderm patch, IV toradol, IV tylenol, and fentanyl - Pulm toilet, incentive spirometer when off BiPAP - spO2 goal of 88-92% - Cefepime day 2 for empiric treatment of possible pneumonia - Lower extremity doppler without evidence of DVT   T12 destructive change suspicious for tumor MRI with change at T12 concerning for malignant or infectious etiology.  No clear primary at this point. Has never had a mammogram, breast exam without overt abnormality. 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 Unable to biopsy for now given her clinical status - outpatient mammogram, colonoscopy   Chronic HFpEF OSA - Continue home medications of Coreg, and Klor-Con - Holding off on Lasix for bump in creatinine and BNP, tachycardia, decreased response to lasix the prior day. suspect she is volume down.  Has been npo for several days now while on bipap - O2 as above - BiPAP at night   Chronic Venous Stasis -- Unna wrappings   Chronic ITP - Daily CBC - Hold off on VTE prophylaxis with this and her hemothorax  VTE prophylaxis: holding off with her thrombocytopenia and hemothoraax Prior to Admission Living Arrangement: home Anticipated Discharge Location: likely home, she has  vehemently declined SNF Barriers to Discharge: medical workup, pain control  Dispo: anticipate discharge in 3-5 days  Dr. Edison Simon Internal Medicine PGY-1  Pager: [phone] After 5pm on weekdays and 1pm on weekends: On Call pager [phone]  01/09/2021, 7:10 AM

## 2021-01-09 NOTE — Care Management Important Message (Signed)
Important Message  Patient Details  Name: [name] MRN: [id number] Date of Birth: 1951/07/25   Medicare Important Message Given:  Yes     Shelda Altes 01/09/2021, 9:08 AM

## 2021-01-09 NOTE — Progress Notes (Signed)
BiPap removed, SpO2 maintaining 99% on 4L HFNC.  Internal Medicine at bedside for morning rounds.  Orders to keep sats 88-92%, will titrate as needed.  Pulmonology in immediately following and received verbal order that pt may eat and drink now.

## 2021-01-10 ENCOUNTER — Inpatient Hospital Stay (HOSPITAL_COMMUNITY): Payer: Medicare PPO

## 2021-01-10 DIAGNOSIS — M79602 Pain in left arm: Secondary | ICD-10-CM

## 2021-01-10 DIAGNOSIS — S2241XG Multiple fractures of ribs, right side, subsequent encounter for fracture with delayed healing: Secondary | ICD-10-CM | POA: Diagnosis not present

## 2021-01-10 DIAGNOSIS — J942 Hemothorax: Secondary | ICD-10-CM | POA: Diagnosis not present

## 2021-01-10 DIAGNOSIS — J9621 Acute and chronic respiratory failure with hypoxia: Secondary | ICD-10-CM | POA: Diagnosis not present

## 2021-01-10 DIAGNOSIS — M899 Disorder of bone, unspecified: Secondary | ICD-10-CM

## 2021-01-10 DIAGNOSIS — J9311 Primary spontaneous pneumothorax: Secondary | ICD-10-CM

## 2021-01-10 DIAGNOSIS — S270XXA Traumatic pneumothorax, initial encounter: Secondary | ICD-10-CM | POA: Diagnosis not present

## 2021-01-10 DIAGNOSIS — J9622 Acute and chronic respiratory failure with hypercapnia: Secondary | ICD-10-CM | POA: Diagnosis not present

## 2021-01-10 LAB — BASIC METABOLIC PANEL
Anion gap: 8 (ref 5–15)
BUN: 31 mg/dL — ABNORMAL HIGH (ref 8–23)
CO2: 33 mmol/L — ABNORMAL HIGH (ref 22–32)
Calcium: 8.3 mg/dL — ABNORMAL LOW (ref 8.9–10.3)
Chloride: 96 mmol/L — ABNORMAL LOW (ref 98–111)
Creatinine, Ser: 0.52 mg/dL (ref 0.44–1.00)
GFR, Estimated: >60 mL/min (ref >60)
Glucose, Bld: 90 mg/dL (ref 70–99)
Potassium: 3.4 mmol/L — ABNORMAL LOW (ref 3.5–5.1)
Sodium: 137 mmol/L (ref 135–145)

## 2021-01-10 LAB — CBC
HCT: 25.5 % — ABNORMAL LOW (ref 36.0–46.0)
Hemoglobin: 7.9 g/dL — ABNORMAL LOW (ref 12.0–15.0)
MCH: 28.6 pg (ref 26.0–34.0)
MCHC: 31 g/dL (ref 30.0–36.0)
MCV: 92.4 fL (ref 80.0–100.0)
Platelets: 108 10*3/uL — ABNORMAL LOW (ref 150–400)
RBC: 2.76 MIL/uL — ABNORMAL LOW (ref 3.87–5.11)
RDW: 17.5 % — ABNORMAL HIGH (ref 11.5–15.5)
WBC: 17.1 10*3/uL — ABNORMAL HIGH (ref 4.0–10.5)
nRBC: 0.2 % (ref 0.0–0.2)

## 2021-01-10 LAB — MAGNESIUM: Magnesium: 1.9 mg/dL (ref 1.7–2.4)

## 2021-01-10 MED ORDER — POTASSIUM CHLORIDE 10 MEQ/100ML IV SOLN
10.0000 meq | INTRAVENOUS | Status: AC
  Administered 2021-01-10 (×6): 10 meq via INTRAVENOUS
  Filled 2021-01-10 (×6): qty 100

## 2021-01-10 NOTE — Progress Notes (Signed)
NAME:  [name], MRN:  [id number], DOB:  07/12/1951, LOS: 6 ADMISSION DATE:  01/02/2021, CONSULTATION DATE:  01/10/21 REFERRING MD: Graciella Freer CHIEF COMPLAINT:  Encephalopathy   History of Present Illness:  70 year old with chronic hypoxemic and hypercarbic respiratory failure in setting of presumed OHS/OSA who was admitted as a trauma 4/20 after fall with rib fractures.  H&P reviewed.  Daily progress note reviewed.  Patient mechanical fall at home.  She fell, backwards, hit the side of the bathtub with her right chest.  Had pain there.  Went to ED.  Chest x-ray on my interpretation showed rib fractures on the right, no pneumothorax appreciated.  CT chest was obtained as part of trauma protocol which showed a tiny anterior pneumothorax.  She was moved to trauma service.  Eventually transferred to the internal medicine teaching service.  Review of labs indicate stable chemistries with elevated bicarb as high as 41.  CBC is reviewed with mild leukocytosis, stable anemia, relatively stable thrombocytopenia.  Of note she has a history of ITP.  On the afternoon of 4/22 she became progressively encephalopathic.  ABG was obtained which revealed a pH of 718 with a PCO2 of 114.  She was placed on BiPAP. Mental status improved.  Pertinent  Medical History  OHS/OSA  Significant Hospital Events: Including procedures, antibiotic start and stop dates in addition to other pertinent events   . 4/20 admitted . 4/22, BiPAP improved MS after PCO2 was 114 . 4/23, worsened effusion, ~800cc dark blood from R chest thora, very difficult to position patient due to habitus and pain . 4/25 morning desaturated to 50% off bipap . 4/25 CT Chest with large hemothorax on right.  . 4/27 chest tube placed by IR for hemothorax, improvement in hypoxemia and respiratory distress   Interim History / Subjective:  On bipap overnight - not sure if it helps her breathing.   Objective   Blood pressure (!) 97/41, pulse 81,  temperature 98.1 F (36.7 C), temperature source Axillary, resp. rate 20, height 5' (1.524 m), weight 86.5 kg, SpO2 98 %.    FiO2 (%):  [28 %-40 %] 28 %   Intake/Output Summary (Last 24 hours) at 01/10/2021 0920 Last data filed at 01/10/2021 0612 Gross per 24 hour  Intake --  Output 770 ml  Net -770 ml   Filed Weights   01/02/21 1550 01/03/21 2316  Weight: 89.6 kg 86.5 kg    Examination: Constitutional: laying flat, chronically ill appearing. No distress Ears, nose, mouth, and throat: dry mucus membranes, skin breakdown on forehead and chin from mask Cardiovascular: RRR, no mrg Respiratory: diminished breath sounds, nonlabored, right sided chest tube with serosanguinous drainage Gastrointestinal: obese, soft Neurologic: awake, alert, conversant Ext: nonpitting edema, wrapped   Labs/imaging that I have personally reviewed   Chest xray 4/28 shows stable air space opacities and   Resolved Hospital Problem list   n/a  Assessment & Plan:  # Acute on chronic hypoxemic and hypercarbic respiratory failure: In setting of fall, rib fractures, splinting, hypoventilation, R hemothorax and L complicated effusion. Improving with drainage of hemothorax. Suspect underlying untreated OSA.  # Complicated by delayed onset R hemothorax post partial evacuation with thoracentesis 4/23.  Received 1 unit plts 4/23. S/p pigtail drainage catheter 4/26 with 1200cc output. Improvement of respiraotry status. Has received 1 unit prbc for anemia.  # Hx ITP, class 2 obesity, OHS, profound deconditioning # T12 lesion question tumor vs. Infection- CT directed biopsy pending respiratory status Acute blood loss anemia  secondary to hemothorax  Maintain chest tube, IR following. Suspect it can be removed once drainage nears 200 cc in 24 hours.  Please notify PCCM if there is hypotension, tachycardia, worsening hypoxemia acutely, or more than 50cc of bloody chest tube output in an hour.  It seems as if the bleeding  has tamponaded, and the residual drainage is now serosanguinous.  I don't feel strongly about treating for pneumonia, especially if she is having side effects from abx. Left lower lobe air space opacities most likely atelectasis from inactivity and small clinically insignificant effusion. Getting out of bed and sitting in chair is the best thing for her lungs. IS will also be helpful.   Pulmonary will see as needed, but please contact us if we can be of further assistance.   Lenice Llamas, MD Pulmonary and [place]

## 2021-01-10 NOTE — Progress Notes (Addendum)
Subjective:   [name] complains of continued back pain on the right side where she had fractures and currently chest tube is placed. No other particular complaints. Notes she has not urinated much. Denies dysuria. Some diarrhea continues, no abdominal pain.   While checking blood pressure, patient complained of significant pain in the left arm. Also notes it has been swollen.  Objective:  Vital signs in last 24 hours: Vitals:   01/10/21 0133 01/10/21 0400 01/10/21 0800 01/10/21 0843  BP:  (!) 144/57 (!) 97/41   Pulse: 76 75 81   Resp: 18 (!) 23 20   Temp:  97.6 F (36.4 C) 98.1 F (36.7 C)   TempSrc:  Oral Axillary   SpO2: 99% 100% 100% 98%  Weight:      Height:       Physical Exam Vitals and nursing note reviewed.  Constitutional: appears comfortable Head: atraumatic Cardiovascular: regular rate and rhythm, normal heart sounds Pulmonary: on nasal canula 2L, ascultation limited by habitus and positioning but clear bilaterally Skin: warm and dry, healing abrasion on chin reportedly from cervical collar, contusion also noted on forehead from BiPAP mask, bilateral lower extremities with unna boots on Neurological: alert, oriented  Assessment/Plan:  [name] is a 70 y/o F with hx of chronic respiratory failure on 3-4L chronically, morbid obesity, HFpEF, chronic ITP, chronic leg wounds who presents with rib fractures after mechanical fall. Acute on chronic hypercapneic and hypoxic respiratory failure, also found to have hemothorax. Respiratory status now much improved after placing chest tube, she is maintaining sats on less than her home oxygen.  Principal Problem:   Pneumothorax Active Problems:   Pressure injury of skin   Multiple closed fractures of ribs of right side   Fall against object   Acute respiratory distress   Rib fractures   Hemothorax on right   Pleural effusion  Acute on chronic hypercapneic and hypoxic respiratory failure, on 3-4L  chronically Ribs fracture due to mechanical fall Traumatic hemothorax in the setting of chronic ITP Symptomatic anemia Rib fractures and hemothorax after mechanical fall. Clinical course has been complicated by worsening hypercarbia and development of hemothorax, initially drained by thoracentesis but reaccumulated so pigtail catheter placed. Output is slowing down, and now more serous. S/p 1 unit pRBCs. Hgb still trending down gradually.   - PCCM following; greatly appreciate recommendations - nasal canula to maintain sats 88-92% - BiPAP at night - daily chest x-ray - daily CBC - Multimodal pain control with lidoderm patch, IV toradol, IV tylenol, and fentanyl - Pulm toilet, incentive spirometer when off BiPAP - change cefepime to cefdinir given new diarrhea, day 3 of Abx - Lower extremity doppler without evidence of DVT - f/u LUE doppler for pain and swelling   T12 destructive change suspicious for tumor MRI with change at T12 concerning for malignant or infectious etiology.  No clear primary at this point. Has never had a mammogram, breast exam without overt abnormality. 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 suspect she is volume down. She was been npo for several days while on bipap - O2 as above - BiPAP at night   Chronic Venous Stasis -- Unna wrappings  Chronic ITP - Daily CBC - Hold off on VTE prophylaxis with this and her hemothorax  VTE prophylaxis: holding off with her thrombocytopenia and hemothoraax Prior to Admission Living Arrangement: home Anticipated Discharge Location: likely home, she has vehemently declined SNF Barriers to Discharge: medical workup, pain  control  Dispo: anticipate discharge in 2-4 days  [name] Internal Medicine PGY-1  Pager: [phone] After 5pm on weekdays and 1pm on weekends: On Call pager [phone]  01/10/2021, 9:58 AM

## 2021-01-10 NOTE — Progress Notes (Signed)
Upper extremity venous LT study completed.   Please see CV Proc for preliminary results.   Darlin Coco, RDMS, RVT

## 2021-01-10 NOTE — Progress Notes (Addendum)
Referring Physician(s): Chen,J  Supervising Physician: Arne Cleveland  Patient Status:  [facility] - In-pt  Chief Complaint: Dyspnea, right hemothorax, T12 lesion   Subjective: Pt states her breathing is better since chest drain placed on 4/26; on N/C now; was on BIPAP overnight;  continues to have some back pain but denies fever,HA,CP, worsening cough, abd pain,N/V or bleeding; she is anxious    Past Medical History:  Diagnosis Date  . Arthritis    lumbar burst fx, osteoporosis  . Back pain 09/29/2013  . Blood dyscrasia   . Chest pain    denies  . Chronic kidney disease    passed stones spontaneously- 40 yrs. ago   . Depression    states her current situation with activity limitations, she has a low spirit at times.   Marland Kitchen Dysrhythmia    occ tachycardia  . Fracture of vertebra, compression ([place])   . GERD (gastroesophageal reflux disease)    occ  . History of kidney stones   . HTN (hypertension)   . ITP (idiopathic thrombocytopenic purpura) 08/29/2013  . Leukocytosis, unspecified 09/05/2013  . Muscle cramp 11/16/2013  . Tachycardia    occ upon exersion, and when put on bp med   Past Surgical History:  Procedure Laterality Date  . ABDOMINAL HYSTERECTOMY    . ANTERIOR LAT LUMBAR FUSION N/A 03/28/2014   Procedure: Anterolateral decompression of L1 fracture with posterior segmental stabilization;  Surgeon: Kristeen Miss, MD;  Location: [place] [facility];  Service: Neurosurgery;  Laterality: N/A;  Anterolateral decompression of Lumbar One fracture with posterior segmental stabilization  . CESAREAN SECTION     x2  . CHEST TUBE INSERTION Left 03/28/2014   Procedure: CHEST TUBE INSERTION;  Surgeon: Ivin Poot, MD;  Location: [facility];  Service: Thoracic;  Laterality: Left;  . INCISIONAL HERNIA REPAIR N/A 11/13/2014   Procedure: PRIMARY REPAIR INCARCERATED INCISIONAL HERNIA WITH PLACEMENT BIOLOGIC MESH;  Surgeon: Rolm Bookbinder, MD;  Location: [place];  Service: General;   Laterality: N/A;  . LAPAROTOMY Right 09   ovarian cyst  . TOTAL ABDOMINAL HYSTERECTOMY W/ BILATERAL SALPINGOOPHORECTOMY  97      Allergies: Patient has no known allergies.  Medications: Prior to Admission medications   Medication Sig Start Date End Date Taking? Authorizing Provider  carvedilol (COREG) 3.125 MG tablet Take 1 tablet (3.125 mg total) by mouth 2 (two) times daily with a meal. 10/04/20  Yes [name], MD  diphenhydramine-acetaminophen (TYLENOL PM) 25-500 MG TABS tablet Take 1 tablet by mouth at bedtime as needed (For sleep).   Yes [provider]  furosemide (LASIX) 80 MG tablet Take 1 tablet (80 mg total) by mouth See admin instructions. Take 80mg  by mouth once daily.  Take an additional 80mg  daily as needed for weight gain of 3 pounds in one week or 5 pounds in one day. 10/02/20  Yes Aslam, Loralyn Freshwater, MD  loperamide (IMODIUM) 2 MG capsule Take 1 capsule (2 mg total) by mouth as needed for diarrhea or loose stools. 05/18/20  Yes Andrew Au, MD  potassium chloride (KLOR-CON) 10 MEQ tablet TAKE 1 TABLET(10 MEQ) BY MOUTH DAILY Patient taking differently: Take 10 mEq by mouth daily. 10/04/20  Yes Aslam, Loralyn Freshwater, MD  promethazine (PHENERGAN) 12.5 MG tablet Take 12.5 mg by mouth every 6 (six) hours as needed for nausea or vomiting.   Yes [provider]  vitamin B-12 1000 MCG tablet Take 1 tablet (1,000 mcg total) by mouth daily. 10/01/20  Yes Seawell, Andris Baumann  A, DO     Vital Signs: BP (!) 117/57 (BP Location: Left Arm)   Pulse 87   Temp 98.2 F (36.8 C) (Oral)   Resp (!) 25   Ht 5' (1.524 m)   Wt 190 lb 11.2 oz (86.5 kg)   SpO2 97%   BMI 37.24 kg/m   Physical Exam awake/alert; chest- dim BS bases; rt chest drain intact, insertion site ok, mildly tender, OP 370 cc amber fluid; no air leak; tube to wall suction; heart- RRR; abd soft,+BS,NT ;both LE's covered in bandages  Imaging: CT CHEST WO CONTRAST  Result Date: 01/07/2021 CLINICAL DATA:  Hemothorax.   Fall 01/02/2021 EXAM: CT CHEST WITHOUT CONTRAST TECHNIQUE: Multidetector CT imaging of the chest was performed following the standard protocol without IV contrast. COMPARISON:  Chest CT 01/02/2021 FINDINGS: Cardiovascular: Aortic atherosclerosis and tortuosity. Mild cardiomegaly. There are coronary artery calcifications. No significant pericardial fluid. Mediastinum/Nodes: No adenopathy. Unchanged appearance of bilateral thyroid nodules. Decompressed esophagus. Lungs/Pleura: Previous pneumothorax has near completely resolved with only small focus of extrapleural gas at the anterior lung base. Increased volume of right pleural fluid from prior exam, heterogeneous density consistent with hemothorax. This is moderate to large in size. There is a large dependent component, with pleural fluid tracking medially adjacent to the mediastinum and anterior laterally in the lower thorax. Complete compressive atelectasis of the right lower lobe. Partial compressive atelectasis of the right middle and dependent right upper lobe. Small complex left pleural effusion has slightly increased from prior. Adjacent compressive atelectasis in the left lower lobe. Linear atelectasis in the lingula. Mild emphysema. Upper Abdomen: Low-density involving the posterior right lobe of the liver is stable from prior imaging. Unusual course of the aorta at the diaphragmatic hiatus is unchanged. Musculoskeletal: Destructive changes involving anterior inferior aspect of T12 appears similar to prior imaging with likely pathologic fracture through the anterior vertebral body. L1 corpectomy is partially included. Stable appearance of multiple thoracic compression fractures. Slight increased displacement of right posterior tenth rib fracture from prior. Fracture appears segmental with both posterior as well as central fracture components at the costovertebral junction. Right ninth rib fracture is unchanged in alignment. No other interval change in  osseous structures. IMPRESSION: 1. Moderate to large right hemothorax. Previous right pneumothorax has near completely resolved with only small residual focus of air anteriorly. 2. Small complex left pleural effusion has slightly increased in size from prior exam. 3. Compressive atelectasis throughout the right lung includes complete lower lobe atelectasis. Scattered atelectasis throughout the left lung. 4. Destructive changes involving anterior inferior aspect of T12 vertebral body, unchanged from recent imaging. Stable appearance of multiple additional thoracic compression fractures. 5. Slight increased displacement of right posterior tenth rib fracture from prior exam. Fracture appears segmental with both posterior as well as central fracture component at the costovertebral junction. Right ninth rib fracture is unchanged in alignment. Aortic Atherosclerosis (ICD10-I70.0) and Emphysema (ICD10-J43.9). Electronically Signed   By: Keith Rake M.D.   On: 01/07/2021 16:53   DG CHEST PORT 1 VIEW  Result Date: 01/10/2021 CLINICAL DATA:  Hemothorax.  Chest tube. EXAM: PORTABLE CHEST 1 VIEW COMPARISON:  04/03/2021. FINDINGS: Right chest tube in stable position. No pneumothorax. Stable cardiomegaly. Low lung volumes with persistent bibasilar atelectasis, slightly improved from prior exam. Persistent left base infiltrate cannot be excluded. Small left pleural effusion again noted. Postsurgical changes thoracolumbar spine again noted. IMPRESSION: 1.  Right chest tube in stable position.  No pneumothorax. 2.  Stable cardiomegaly. 3. Low lung  volumes with persistent bibasilar atelectasis, slightly improved from prior exam. Persistent left base infiltrate cannot be excluded. Small left pleural effusion again noted. Electronically Signed   By: Marcello Moores  Register   On: 01/10/2021 08:22   DG CHEST PORT 1 VIEW  Result Date: 01/09/2021 CLINICAL DATA:  Shortness of breath.  Chest tube. EXAM: PORTABLE CHEST 1 VIEW  COMPARISON:  01/08/2021. FINDINGS: Right chest tube noted in good anatomic position. Interim resolution of right pleural effusion. No pneumothorax. Persistent bibasilar atelectasis. Left base infiltrate cannot be excluded. Small left pleural effusion noted. Heart size stable. IMPRESSION: 1. Right chest tube noted in good anatomic position. Interim resolution of right pleural effusion. No pneumothorax. 2. Persistent bibasilar atelectasis. Left base infiltrate cannot be excluded. Small left pleural effusion. Electronically Signed   By: Marcello Moores  Register   On: 01/09/2021 07:56   DG CHEST PORT 1 VIEW  Result Date: 01/08/2021 CLINICAL DATA:  Hemothorax. EXAM: PORTABLE CHEST 1 VIEW COMPARISON:  CT 01/07/2021.  Chest x-ray 01/07/2021. FINDINGS: Cardiomegaly again noted without interim change. Persistent diffuse right lung and left base infiltrates/edema. Persistent bibasilar atelectasis. Persistent moderate right pleural effusion. No pneumothorax. Postsurgical changes thoracolumbar spine again noted. IMPRESSION: 1.  Cardiomegaly again noted. 2. Persistent diffuse right lung and left base infiltrates/edema. Persistent bibasilar atelectasis. Persistent moderate right pleural effusion. Electronically Signed   By: Marcello Moores  Register   On: 01/08/2021 05:10   DG Chest Port 1 View  Result Date: 01/07/2021 CLINICAL DATA:  Right-sided hemothorax EXAM: PORTABLE CHEST 1 VIEW COMPARISON:  January 06, 2021 FINDINGS: There is persistent apparent elevation of the right hemidiaphragm with apparent pleural effusion on the right, similar in appearance. There is atelectasis with suspected superimposed airspace consolidation in the left base laterally. No new opacity evident. No pneumothorax appreciable. Stable cardiac enlargement. The pulmonary vascularity appears within normal limits. No adenopathy. No bone lesions. IMPRESSION: Persistent pleural fluid on the right with elevation of right hemidiaphragm, unchanged from 1 day prior.  Atelectasis with suspected associated airspace consolidation in lateral left base. Stable cardiomegaly. No new opacity compared to 1 day prior. Electronically Signed   By: Lowella Grip III M.D.   On: 01/07/2021 08:41   CT IMAGE GUIDED DRAINAGE BY PERCUTANEOUS CATHETER  Result Date: 01/08/2021 INDICATION: 70 year old woman with right hemothorax presents today measure radiology for chest tube placement. EXAM: CT GUIDED DRAINAGE OF RIGHT HEMOTHORAX MEDICATIONS: The patient is currently admitted to the hospital and receiving intravenous antibiotics. The antibiotics were administered within an appropriate time frame prior to the initiation of the procedure. ANESTHESIA/SEDATION: 3 mg IV Versed 150 mcg IV Fentanyl Moderate Sedation Time:  30 minutes The patient was continuously monitored during the procedure by the interventional radiology nurse under my direct supervision. COMPLICATIONS: None immediate. TECHNIQUE: Informed written consent was obtained from the patient and patient's family after a thorough discussion of the procedural risks, benefits and alternatives. All questions were addressed. Maximal Sterile Barrier Technique was utilized including caps, mask, sterile gowns, sterile gloves, sterile drape, hand hygiene and skin antiseptic. A timeout was performed prior to the initiation of the procedure. PROCEDURE: The right lateral chest wall was prepped with chlorhexidine in a sterile fashion, and a sterile drape was applied covering the operative field. A sterile gown and sterile gloves were used for the procedure. Local anesthesia was provided with 1% Lidocaine. Utilizing CT guidance, 22 gauge spinal needle was advanced to the right pleural surface. The tract was anesthetized with lidocaine. The right pleural space was entered with a 17  gauge introducer needle utilizing CT guidance. The needle was exchanged for a 12 [place] multipurpose pigtail drain over 0.035 inch guidewire. Drain was secured to skin with  suture and connected to Pleur-Evac set at -20 cm H2O. IMPRESSION: 25 French multipurpose pigtail drain placed in right hemothorax utilizing CT guidance. Electronically Signed   By: Miachel Roux M.D.   On: 01/08/2021 17:26   VAS [place] LOWER EXTREMITY VENOUS (DVT)  Result Date: 01/08/2021  Lower Venous DVT Study Patient Name:  NEMA OATLEY  Date of Exam:   01/08/2021 Medical Rec #: [id number]         Accession #:    [id number] Date of Birth: 1951-05-12         Patient Gender: F Patient Age:   70Y Exam Location:  [facility] Procedure:      VAS [place] LOWER EXTREMITY VENOUS (DVT) Referring Phys: [id number] CAROLYN GUILLOUD --------------------------------------------------------------------------------  Indications: Left leg pain. Other Indications: History of chronic respiratory failure. Limitations: Bandages. Comparison Study: Last venous study on file 06/10/17 - negative Performing Technologist: Velva Harman Sturdivant RDMS, RVT  Examination Guidelines: A complete evaluation includes B-mode imaging, spectral Doppler, color Doppler, and power Doppler as needed of all accessible portions of each vessel. Bilateral testing is considered an integral part of a complete examination. Limited examinations for reoccurring indications may be performed as noted. The reflux portion of the exam is performed with the patient in reverse Trendelenburg.  +-----+---------------+---------+-----------+----------+--------------+ RIGHTCompressibilityPhasicitySpontaneityPropertiesThrombus Aging +-----+---------------+---------+-----------+----------+--------------+ CFV  Full           Yes      Yes                                 +-----+---------------+---------+-----------+----------+--------------+ SFJ  Full                                                        +-----+---------------+---------+-----------+----------+--------------+   +---------+---------------+---------+-----------+----------+-------------------+ LEFT      CompressibilityPhasicitySpontaneityPropertiesThrombus Aging      +---------+---------------+---------+-----------+----------+-------------------+ CFV      Full           Yes      Yes                                      +---------+---------------+---------+-----------+----------+-------------------+ SFJ      Full                                                             +---------+---------------+---------+-----------+----------+-------------------+ FV Prox  Full                                                             +---------+---------------+---------+-----------+----------+-------------------+ FV Mid   Full                                                             +---------+---------------+---------+-----------+----------+-------------------+  FV DistalFull                                                             +---------+---------------+---------+-----------+----------+-------------------+ PFV      Full                                                             +---------+---------------+---------+-----------+----------+-------------------+ POP      Full           Yes      Yes                                      +---------+---------------+---------+-----------+----------+-------------------+ PERO                                                  not evaluated due                                                         to bandages         +---------+---------------+---------+-----------+----------+-------------------+ Soleal                                                not evaluated due                                                         to bandages         +---------+---------------+---------+-----------+----------+-------------------+     Summary: RIGHT: - No evidence of common femoral vein obstruction.  LEFT: - There is no evidence of deep vein thrombosis in the lower extremity. However, portions of this  examination were limited- see technologist comments above.  *See table(s) above for measurements and observations. Electronically signed by Deitra Mayo MD on 01/08/2021 at 7:03:54 PM.    Final    VAS [place] UPPER EXTREMITY VENOUS DUPLEX  Result Date: 01/10/2021 UPPER VENOUS STUDY  Patient Name:  JENNIFER KNUEPPEL  Date of Exam:   01/10/2021 Medical Rec #: [id number]         Accession #:    [id number] Date of Birth: 30-May-1951         Patient Gender: F Patient Age:   70Y Exam Location:  [facility] Procedure:      VAS [place] UPPER EXTREMITY VENOUS DUPLEX Referring Phys: [id number] CAROLYN GUILLOUD --------------------------------------------------------------------------------  Indications: Pain in LT arm Limitations: Bandages. Comparison Study: No prior studies.  Performing Technologist: Darlin Coco RDMS,RVT  Examination Guidelines: A complete evaluation includes B-mode imaging, spectral Doppler, color Doppler, and power Doppler as needed of all accessible portions of each vessel. Bilateral testing is considered an integral part of a complete examination. Limited examinations for reoccurring indications may be performed as noted.  Right Findings: +----------+------------+---------+-----------+----------+-------+ RIGHT     CompressiblePhasicitySpontaneousPropertiesSummary +----------+------------+---------+-----------+----------+-------+ Subclavian    Full       Yes       Yes                      +----------+------------+---------+-----------+----------+-------+  Left Findings: +----------+------------+---------+-----------+----------+---------------------+ LEFT      CompressiblePhasicitySpontaneousProperties       Summary        +----------+------------+---------+-----------+----------+---------------------+ IJV           Full       Yes       Yes                                    +----------+------------+---------+-----------+----------+---------------------+ Subclavian    Full        Yes       Yes                                    +----------+------------+---------+-----------+----------+---------------------+ Axillary      Full       Yes       Yes                                    +----------+------------+---------+-----------+----------+---------------------+ Brachial      Full                                                        +----------+------------+---------+-----------+----------+---------------------+ Radial        Full                                    Some segments not                                                        well visualized due                                                         to bandaging/IV    +----------+------------+---------+-----------+----------+---------------------+ Ulnar         Full                                    Some segments not  well visualized due                                                         to bandaging/IV    +----------+------------+---------+-----------+----------+---------------------+ Cephalic      Full                                                        +----------+------------+---------+-----------+----------+---------------------+ Basilic       Full                                                        +----------+------------+---------+-----------+----------+---------------------+  Summary:  Right: No evidence of thrombosis in the subclavian.  Left: No evidence of deep vein thrombosis in the upper extremity. No evidence of superficial vein thrombosis in the upper extremity.  *See table(s) above for measurements and observations.     Preliminary     Labs:  CBC: Recent Labs    01/08/21 1800 01/09/21 0533 01/09/21 1626 01/10/21 0139  WBC 21.0* 18.5* 22.3* 17.1*  HGB 7.0* 8.6* 7.6* 7.9*  HCT 22.3* 27.4* 24.4* 25.5*  PLT 125* 122* 126* 108*    COAGS: Recent Labs     04/19/20 0341 05/13/20 2149 06/29/20 1427 09/26/20 1337 01/08/21 0650  INR  --  1.1 1.0 1.1 1.2  APTT 31 24 30  33  --     BMP: Recent Labs    05/14/20 0614 05/15/20 0058 05/16/20 0110 05/17/20 0631 06/29/20 1427 01/07/21 0618 01/08/21 0515 01/09/21 0533 01/10/21 0139  NA 135 138 140 142   < > 140 142 144 137  K 3.9 3.7 3.8 3.9   < > 3.6 3.4* 3.3* 3.4*  CL 92* 96* 97* 99   < > 90* 90* 99 96*  CO2 34* 33* 35* 35*   < > 40* 38* 37* 33*  GLUCOSE 96 136* 95 91   < > 102* 115* 94 90  BUN 35* 21 9 7*   < > 30* 51* 52* 31*  CALCIUM 8.5* 8.7* 9.1 9.3   < > 9.2 8.8* 8.7* 8.3*  CREATININE 1.87* 0.85 0.55 0.56   < > 0.54 0.84 0.76 0.52  GFRNONAA 27* >60 >60 >60   < > >60 >60 >60 >60  GFRAA 32* >60 >60 >60  --   --   --   --   --    < > = values in this interval not displayed.    LIVER FUNCTION TESTS: Recent Labs    07/08/20 0336 09/26/20 1337 09/27/20 0404 01/04/21 0049  BILITOT 1.8* 0.9 1.0 0.8  AST 18 24 17 23   ALT 21 22 19  36  ALKPHOS 60 114 91 90  PROT 5.5* 6.4* 5.7* 6.2*  ALBUMIN 3.4* 3.0* 2.5* 3.5    Assessment and Plan: 70 year old with chronic hypoxemic and hypercarbic respiratory failure in setting of presumed OHS/OSA who was admitted as a trauma 4/20 after fall with rib fractures, right hemothorax, s/p drain placement on 4/26; PMH also  sig for CHF, OSA, chronic venous stasis, ITP; imaging also reveals T12 vertebral body lesion as well as remote T spine compression fractures; afebrile; WBC 17.1(22.3), hgb 7.9(7.6), creat nl; CXR today with no ptx, stable CM, low lung volumes with bb atx; continue chest tube until OP < 200 cc over 24 hrs as per pulm recs; pt will be tent scheduled for image guided T12 lesion bx on 4/29; case/imaging studies have been reviewed by Dr. Vernard Gambles. Risks and benefits of procedure was discussed with the patient and/or patient's family including, but not limited to bleeding, infection, damage to adjacent structures or low yield requiring  additional tests as well as inability to safely perform bx in prone position.  All of the questions were answered and there is agreement to proceed.  Consent signed and in chart.     Electronically Signed: D. Rowe Robert, PA-C 01/10/2021, 2:36 PM   I spent a total of 20 minutes at the the patient's bedside AND on the patient's hospital floor or unit, greater than 50% of which was counseling/coordinating care for image guided biopsy of T12 lesion; right chest drain    Patient ID: [name], female   DOB: [birth year], 70 y.o.   MRN: [id number]

## 2021-01-11 ENCOUNTER — Inpatient Hospital Stay (HOSPITAL_COMMUNITY): Payer: Medicare PPO

## 2021-01-11 DIAGNOSIS — S270XXA Traumatic pneumothorax, initial encounter: Secondary | ICD-10-CM | POA: Diagnosis not present

## 2021-01-11 DIAGNOSIS — Z9689 Presence of other specified functional implants: Secondary | ICD-10-CM | POA: Diagnosis not present

## 2021-01-11 DIAGNOSIS — J942 Hemothorax: Secondary | ICD-10-CM | POA: Diagnosis not present

## 2021-01-11 LAB — CBC
HCT: 26 % — ABNORMAL LOW (ref 36.0–46.0)
Hemoglobin: 7.9 g/dL — ABNORMAL LOW (ref 12.0–15.0)
MCH: 28.8 pg (ref 26.0–34.0)
MCHC: 30.4 g/dL (ref 30.0–36.0)
MCV: 94.9 fL (ref 80.0–100.0)
Platelets: 106 10*3/uL — ABNORMAL LOW (ref 150–400)
RBC: 2.74 MIL/uL — ABNORMAL LOW (ref 3.87–5.11)
RDW: 18.1 % — ABNORMAL HIGH (ref 11.5–15.5)
WBC: 13.2 10*3/uL — ABNORMAL HIGH (ref 4.0–10.5)
nRBC: 0.2 % (ref 0.0–0.2)

## 2021-01-11 LAB — BASIC METABOLIC PANEL
Anion gap: 5 (ref 5–15)
BUN: 16 mg/dL (ref 8–23)
CO2: 36 mmol/L — ABNORMAL HIGH (ref 22–32)
Calcium: 8.8 mg/dL — ABNORMAL LOW (ref 8.9–10.3)
Chloride: 97 mmol/L — ABNORMAL LOW (ref 98–111)
Creatinine, Ser: 0.49 mg/dL (ref 0.44–1.00)
GFR, Estimated: >60 mL/min (ref >60)
Glucose, Bld: 114 mg/dL — ABNORMAL HIGH (ref 70–99)
Potassium: 4.3 mmol/L (ref 3.5–5.1)
Sodium: 138 mmol/L (ref 135–145)

## 2021-01-11 MED ORDER — ACETAMINOPHEN 500 MG PO TABS
1000.0000 mg | ORAL_TABLET | Freq: Three times a day (TID) | ORAL | Status: DC
  Administered 2021-01-11 – 2021-01-18 (×18): 1000 mg via ORAL
  Filled 2021-01-11 (×18): qty 2

## 2021-01-11 MED ORDER — LIDOCAINE HCL 1 % IJ SOLN
INTRAMUSCULAR | Status: AC
  Filled 2021-01-11: qty 20

## 2021-01-11 NOTE — Care Management Important Message (Signed)
Important Message  Patient Details  Name: [name] MRN: [id number] Date of Birth: 05/03/1951   Medicare Important Message Given:  Yes     Shelda Altes 01/11/2021, 9:33 AM

## 2021-01-11 NOTE — Progress Notes (Signed)
Pt brought down to radiology for procedure. Pt states that she does not want to do procedure. Pt states that she cannot lay prone. Pt states "I am tired I would rather do it another time. I just have been through a lot and I am in constant pain." Pt sent back to room as requested.

## 2021-01-11 NOTE — Progress Notes (Signed)
IR consulted by Dr. Philipp Ovens for possible image-guided T12 lesion biopsy.  Case/images have been reviewed by Dr. Vernard Gambles who approves procedure. Please see progress note from Odelia Gage, PA-C 01/10/2021 for further work-up on this.  Patient brought to IR today for procedure, however refused procedure stating she cannot lay prone secondary to pain. Patient was sent back to floor and diet restarted for today. Spoke to Dr. Bridgett Larsson ([facility]) about possible biopsy next week- he states he will speak to patient Sunday about possible procedure Monday. If patient agrees to procedure, please let IR know and please make NPO at midnight for possible procedure Monday 01/14/2021.  Please call IR with questions/concerns.   Evelyn Graff Julien Oscar, PA-C 01/11/2021, 3:43 PM

## 2021-01-11 NOTE — Plan of Care (Signed)

## 2021-01-11 NOTE — Progress Notes (Signed)
Subjective:   No acute events overnight.  Chest tube putting out serosanguineous fluid, 210 cc yesterday  Patient evaluated at bedside.  Reports continued pain in her back and at the site of the chest tube, no other new symptoms.  States that she was unable to do the biopsy today because she has a lot going on.  She became tearful while discussing this.  We will revisit this next week.  If she still feels unable to do it, neurosurgery was okay with having this done in an outpatient basis.  Objective:  Vital signs in last 24 hours: Vitals:   01/10/21 2314 01/10/21 2321 01/10/21 2322 01/11/21 0306  BP: (!) 121/50   128/64  Pulse: 93 92 90 78  Resp: 20 (!) 29 (!) 34 19  Temp: 98.9 F (37.2 C)   (!) 97 F (36.1 C)  TempSrc: Oral   Axillary  SpO2: 98% 100% 100% 98%  Weight:      Height:       Physical Exam Vitals and nursing note reviewed.  Constitutional: appears comfortable Head: atraumatic Cardiovascular: regular rate and rhythm, normal heart sounds Pulmonary: on nasal canula 2L, ascultation limited by habitus and positioning but clear bilaterally Skin: warm and dry, healing abrasion on chin reportedly from cervical collar, contusion also noted on forehead from BiPAP mask, lower extremities with chronic venous stasis changes, no significant edema, pending Unna boot changes later on today Neurological: alert, oriented   Assessment/Plan:  [name] is a 70 y/o F with hx of chronic respiratory failure on 3-4L chronically, morbid obesity, HFpEF, chronic ITP, chronic leg wounds who presents with rib fractures after mechanical fall. Acute on chronic hypercapneic and hypoxic respiratory failure, also found to have hemothorax. Respiratory status now much improved after placing chest tube, she is maintaining sats on less than her home oxygen.  Principal Problem:   Pneumothorax Active Problems:   Pressure injury of skin   Multiple closed fractures of ribs of right side   Fall  against object   Acute respiratory distress   Rib fractures   Hemothorax   Pleural effusion   Lesion of bone of thoracic spine  Acute on chronic hypercapneic and hypoxic respiratory failure, on 3-4L chronically Ribs fracture due to mechanical fall Traumatic hemothorax in the setting of chronic ITP Symptomatic anemia Rib fractures and hemothorax after mechanical fall. Clinical course has been complicated by worsening hypercarbia and development of hemothorax, initially drained by thoracentesis but reaccumulated so pigtail catheter placed. Output is slowing down, and now more serous. S/p 1 unit pRBCs. Hgb stabilized.  Saturating well on 2 L.  - PCCM signed off - Appreciate IR following chest tube, plan for waterseal trial today and possible removal tomorrow - nasal canula to maintain sats 88-92% - BiPAP at night - daily chest x-ray - daily CBC - Multimodal pain control with lidoderm patch, IV toradol, IV tylenol, and fentanyl - Pulm toilet, incentive spirometer when off BiPAP - Switch cefepime to cefdinir, day 4 out of 5 - Lower extremity doppler and left upper extremity Doppler without evidence of DVT   T12 destructive change suspicious for tumor MRI with change at T12 concerning for malignant or infectious etiology.  No clear primary at this point. Has never had a mammogram, breast exam without overt abnormality. 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 Patient was taken down to IR for biopsy today, but she refused it saying  that she has gone through too much and can do right now.  Spoke with neurosurgery last week who said this was not necessarily an urgent procedure and can be done outpatient.  We will revisit this with her next week, but may defer this to the outpatient setting.  - Neurosurgery consulted - IR consulted for biopsy, appreciate assistance, unfortunately patient cannot complete biopsy - outpatient mammogram,  colonoscopy   Chronic HFpEF OSA - Continue home medications of Coreg, and Klor-Con - Holding off on Lasix. suspect she is volume down. She was npo for several days while on bipap.  Will need to restart before discharge. - O2 as above - BiPAP at night   Chronic Venous Stasis -- Unna wrappings   Chronic ITP - Daily CBC - Hold off on VTE prophylaxis with this and her hemothorax  VTE prophylaxis: holding off with her thrombocytopenia and hemothoraax Prior to Admission Living Arrangement: home Anticipated Discharge Location: likely home, she has vehemently declined SNF Barriers to Discharge: medical workup, pain control  Dispo: anticipate discharge in 2-3 days  Dr. Edison Simon Internal Medicine PGY-1  Pager: [phone] After 5pm on weekdays and 1pm on weekends: On Call pager [phone]  01/11/2021, 7:08 AM

## 2021-01-11 NOTE — Progress Notes (Signed)
Referring Physician(s): Velna Ochs (IMTS)  Supervising Physician: Arne Cleveland  Patient Status:  [facility] - In-pt  Chief Complaint: Chest tube F/U  Subjective:  History of mechanical fall with subsequent right rib fractures and right hemothorax s/p right chest tube placement in IR 01/08/2021. Patient awake and alert laying in bed. IMTS (3 MDs) at bedside. Right chest tube site c/d/i. Sating 93% on 2L [place].  CXR this AM: 1. Stable position of right-sided chest tube. Bibasilar subsegmental atelectasis. Small left pleural effusion.   Allergies: Patient has no known allergies.  Medications: Prior to Admission medications   Medication Sig Start Date End Date Taking? Authorizing Provider  carvedilol (COREG) 3.125 MG tablet Take 1 tablet (3.125 mg total) by mouth 2 (two) times daily with a meal. 10/04/20  Yes [name], MD  diphenhydramine-acetaminophen (TYLENOL PM) 25-500 MG TABS tablet Take 1 tablet by mouth at bedtime as needed (For sleep).   Yes [provider]  furosemide (LASIX) 80 MG tablet Take 1 tablet (80 mg total) by mouth See admin instructions. Take 80mg  by mouth once daily.  Take an additional 80mg  daily as needed for weight gain of 3 pounds in one week or 5 pounds in one day. 10/02/20  Yes Aslam, Loralyn Freshwater, MD  loperamide (IMODIUM) 2 MG capsule Take 1 capsule (2 mg total) by mouth as needed for diarrhea or loose stools. 05/18/20  Yes Andrew Au, MD  potassium chloride (KLOR-CON) 10 MEQ tablet TAKE 1 TABLET(10 MEQ) BY MOUTH DAILY Patient taking differently: Take 10 mEq by mouth daily. 10/04/20  Yes Aslam, Loralyn Freshwater, MD  promethazine (PHENERGAN) 12.5 MG tablet Take 12.5 mg by mouth every 6 (six) hours as needed for nausea or vomiting.   Yes [provider]  vitamin B-12 1000 MCG tablet Take 1 tablet (1,000 mcg total) by mouth daily. 10/01/20  Yes Seawell, Jaimie A, DO     Vital Signs: BP 134/62 (BP Location: Left Arm)   Pulse 81   Temp 98.2 F  (36.8 C)   Resp (!) 21   Ht 5' (1.524 m)   Wt 190 lb 11.2 oz (86.5 kg)   SpO2 99%   BMI 37.24 kg/m   Physical Exam Vitals and nursing note reviewed.  Constitutional:      General: She is not in acute distress. Pulmonary:     Effort: Pulmonary effort is normal. No respiratory distress.     Comments: Sating 93% on 2L [place]. Right chest tube site without tenderness, erythema, drainage, or active bleeding; approximately 560 cc serosanguinous fluid in pleure-vac, tube to suction with (-) air leak. Skin:    General: Skin is warm and dry.  Neurological:     Mental Status: She is alert and oriented to person, place, and time.     Imaging: CT CHEST WO CONTRAST  Result Date: 01/07/2021 CLINICAL DATA:  Hemothorax.  Fall 01/02/2021 EXAM: CT CHEST WITHOUT CONTRAST TECHNIQUE: Multidetector CT imaging of the chest was performed following the standard protocol without IV contrast. COMPARISON:  Chest CT 01/02/2021 FINDINGS: Cardiovascular: Aortic atherosclerosis and tortuosity. Mild cardiomegaly. There are coronary artery calcifications. No significant pericardial fluid. Mediastinum/Nodes: No adenopathy. Unchanged appearance of bilateral thyroid nodules. Decompressed esophagus. Lungs/Pleura: Previous pneumothorax has near completely resolved with only small focus of extrapleural gas at the anterior lung base. Increased volume of right pleural fluid from prior exam, heterogeneous density consistent with hemothorax. This is moderate to large in size. There is a large dependent component, with pleural fluid tracking medially  adjacent to the mediastinum and anterior laterally in the lower thorax. Complete compressive atelectasis of the right lower lobe. Partial compressive atelectasis of the right middle and dependent right upper lobe. Small complex left pleural effusion has slightly increased from prior. Adjacent compressive atelectasis in the left lower lobe. Linear atelectasis in the lingula. Mild emphysema.  Upper Abdomen: Low-density involving the posterior right lobe of the liver is stable from prior imaging. Unusual course of the aorta at the diaphragmatic hiatus is unchanged. Musculoskeletal: Destructive changes involving anterior inferior aspect of T12 appears similar to prior imaging with likely pathologic fracture through the anterior vertebral body. L1 corpectomy is partially included. Stable appearance of multiple thoracic compression fractures. Slight increased displacement of right posterior tenth rib fracture from prior. Fracture appears segmental with both posterior as well as central fracture components at the costovertebral junction. Right ninth rib fracture is unchanged in alignment. No other interval change in osseous structures. IMPRESSION: 1. Moderate to large right hemothorax. Previous right pneumothorax has near completely resolved with only small residual focus of air anteriorly. 2. Small complex left pleural effusion has slightly increased in size from prior exam. 3. Compressive atelectasis throughout the right lung includes complete lower lobe atelectasis. Scattered atelectasis throughout the left lung. 4. Destructive changes involving anterior inferior aspect of T12 vertebral body, unchanged from recent imaging. Stable appearance of multiple additional thoracic compression fractures. 5. Slight increased displacement of right posterior tenth rib fracture from prior exam. Fracture appears segmental with both posterior as well as central fracture component at the costovertebral junction. Right ninth rib fracture is unchanged in alignment. Aortic Atherosclerosis (ICD10-I70.0) and Emphysema (ICD10-J43.9). Electronically Signed   By: Keith Rake M.D.   On: 01/07/2021 16:53   DG CHEST PORT 1 VIEW  Result Date: 01/11/2021 CLINICAL DATA:  Chest tube.  Hemothorax. EXAM: PORTABLE CHEST 1 VIEW COMPARISON:  January 10, 2021. FINDINGS: Stable cardiomegaly. Right-sided chest tube is unchanged in  position. No pneumothorax is noted. Stable bibasilar subsegmental atelectasis. Probable small left pleural effusion is noted. Bony thorax is unremarkable. IMPRESSION: Stable position of right-sided chest tube. Bibasilar subsegmental atelectasis. Small left pleural effusion. Electronically Signed   By: Marijo Conception M.D.   On: 01/11/2021 12:31   DG CHEST PORT 1 VIEW  Result Date: 01/10/2021 CLINICAL DATA:  Hemothorax.  Chest tube. EXAM: PORTABLE CHEST 1 VIEW COMPARISON:  04/03/2021. FINDINGS: Right chest tube in stable position. No pneumothorax. Stable cardiomegaly. Low lung volumes with persistent bibasilar atelectasis, slightly improved from prior exam. Persistent left base infiltrate cannot be excluded. Small left pleural effusion again noted. Postsurgical changes thoracolumbar spine again noted. IMPRESSION: 1.  Right chest tube in stable position.  No pneumothorax. 2.  Stable cardiomegaly. 3. Low lung volumes with persistent bibasilar atelectasis, slightly improved from prior exam. Persistent left base infiltrate cannot be excluded. Small left pleural effusion again noted. Electronically Signed   By: Marcello Moores  Register   On: 01/10/2021 08:22   DG CHEST PORT 1 VIEW  Result Date: 01/09/2021 CLINICAL DATA:  Shortness of breath.  Chest tube. EXAM: PORTABLE CHEST 1 VIEW COMPARISON:  01/08/2021. FINDINGS: Right chest tube noted in good anatomic position. Interim resolution of right pleural effusion. No pneumothorax. Persistent bibasilar atelectasis. Left base infiltrate cannot be excluded. Small left pleural effusion noted. Heart size stable. IMPRESSION: 1. Right chest tube noted in good anatomic position. Interim resolution of right pleural effusion. No pneumothorax. 2. Persistent bibasilar atelectasis. Left base infiltrate cannot be excluded. Small left pleural effusion. Electronically  Signed   By: Marcello Moores  Register   On: 01/09/2021 07:56   DG CHEST PORT 1 VIEW  Result Date: 01/08/2021 CLINICAL DATA:   Hemothorax. EXAM: PORTABLE CHEST 1 VIEW COMPARISON:  CT 01/07/2021.  Chest x-ray 01/07/2021. FINDINGS: Cardiomegaly again noted without interim change. Persistent diffuse right lung and left base infiltrates/edema. Persistent bibasilar atelectasis. Persistent moderate right pleural effusion. No pneumothorax. Postsurgical changes thoracolumbar spine again noted. IMPRESSION: 1.  Cardiomegaly again noted. 2. Persistent diffuse right lung and left base infiltrates/edema. Persistent bibasilar atelectasis. Persistent moderate right pleural effusion. Electronically Signed   By: Marcello Moores  Register   On: 01/08/2021 05:10   CT IMAGE GUIDED DRAINAGE BY PERCUTANEOUS CATHETER  Result Date: 01/08/2021 INDICATION: 70 year old woman with right hemothorax presents today measure radiology for chest tube placement. EXAM: CT GUIDED DRAINAGE OF RIGHT HEMOTHORAX MEDICATIONS: The patient is currently admitted to the hospital and receiving intravenous antibiotics. The antibiotics were administered within an appropriate time frame prior to the initiation of the procedure. ANESTHESIA/SEDATION: 3 mg IV Versed 150 mcg IV Fentanyl Moderate Sedation Time:  30 minutes The patient was continuously monitored during the procedure by the interventional radiology nurse under my direct supervision. COMPLICATIONS: None immediate. TECHNIQUE: Informed written consent was obtained from the patient and patient's family after a thorough discussion of the procedural risks, benefits and alternatives. All questions were addressed. Maximal Sterile Barrier Technique was utilized including caps, mask, sterile gowns, sterile gloves, sterile drape, hand hygiene and skin antiseptic. A timeout was performed prior to the initiation of the procedure. PROCEDURE: The right lateral chest wall was prepped with chlorhexidine in a sterile fashion, and a sterile drape was applied covering the operative field. A sterile gown and sterile gloves were used for the procedure.  Local anesthesia was provided with 1% Lidocaine. Utilizing CT guidance, 22 gauge spinal needle was advanced to the right pleural surface. The tract was anesthetized with lidocaine. The right pleural space was entered with a 17 gauge introducer needle utilizing CT guidance. The needle was exchanged for a 12 [place] multipurpose pigtail drain over 0.035 inch guidewire. Drain was secured to skin with suture and connected to Pleur-Evac set at -20 cm H2O. IMPRESSION: 32 French multipurpose pigtail drain placed in right hemothorax utilizing CT guidance. Electronically Signed   By: Miachel Roux M.D.   On: 01/08/2021 17:26   VAS [place] LOWER EXTREMITY VENOUS (DVT)  Result Date: 01/08/2021  Lower Venous DVT Study Patient Name:  MARIBELLA KUNA  Date of Exam:   01/08/2021 Medical Rec #: [id number]         Accession #:    [id number] Date of Birth: 18-Apr-1951         Patient Gender: F Patient Age:   70Y Exam Location:  [facility] Procedure:      VAS [place] LOWER EXTREMITY VENOUS (DVT) Referring Phys: [id number] CAROLYN GUILLOUD --------------------------------------------------------------------------------  Indications: Left leg pain. Other Indications: History of chronic respiratory failure. Limitations: Bandages. Comparison Study: Last venous study on file 06/10/17 - negative Performing Technologist: Velva Harman Sturdivant RDMS, RVT  Examination Guidelines: A complete evaluation includes B-mode imaging, spectral Doppler, color Doppler, and power Doppler as needed of all accessible portions of each vessel. Bilateral testing is considered an integral part of a complete examination. Limited examinations for reoccurring indications may be performed as noted. The reflux portion of the exam is performed with the patient in reverse Trendelenburg.  +-----+---------------+---------+-----------+----------+--------------+ RIGHTCompressibilityPhasicitySpontaneityPropertiesThrombus Aging  +-----+---------------+---------+-----------+----------+--------------+ CFV  Full  Yes      Yes                                 +-----+---------------+---------+-----------+----------+--------------+ SFJ  Full                                                        +-----+---------------+---------+-----------+----------+--------------+   +---------+---------------+---------+-----------+----------+-------------------+ LEFT     CompressibilityPhasicitySpontaneityPropertiesThrombus Aging      +---------+---------------+---------+-----------+----------+-------------------+ CFV      Full           Yes      Yes                                      +---------+---------------+---------+-----------+----------+-------------------+ SFJ      Full                                                             +---------+---------------+---------+-----------+----------+-------------------+ FV Prox  Full                                                             +---------+---------------+---------+-----------+----------+-------------------+ FV Mid   Full                                                             +---------+---------------+---------+-----------+----------+-------------------+ FV DistalFull                                                             +---------+---------------+---------+-----------+----------+-------------------+ PFV      Full                                                             +---------+---------------+---------+-----------+----------+-------------------+ POP      Full           Yes      Yes                                      +---------+---------------+---------+-----------+----------+-------------------+ PERO  not evaluated due                                                         to bandages          +---------+---------------+---------+-----------+----------+-------------------+ Soleal                                                not evaluated due                                                         to bandages         +---------+---------------+---------+-----------+----------+-------------------+     Summary: RIGHT: - No evidence of common femoral vein obstruction.  LEFT: - There is no evidence of deep vein thrombosis in the lower extremity. However, portions of this examination were limited- see technologist comments above.  *See table(s) above for measurements and observations. Electronically signed by Deitra Mayo MD on 01/08/2021 at 7:03:54 PM.    Final    VAS [place] UPPER EXTREMITY VENOUS DUPLEX  Result Date: 01/10/2021 UPPER VENOUS STUDY  Patient Name:  CHARNESSA METZNER  Date of Exam:   01/10/2021 Medical Rec #: [id number]         Accession #:    [id number] Date of Birth: [birth year]         Patient Gender: F Patient Age:   70Y Exam Location:  [facility] Procedure:      VAS [place] UPPER EXTREMITY VENOUS DUPLEX Referring Phys: [id number] CAROLYN GUILLOUD --------------------------------------------------------------------------------  Indications: Pain in LT arm Limitations: Bandages. Comparison Study: No prior studies. Performing Technologist: Darlin Coco RDMS,RVT  Examination Guidelines: A complete evaluation includes B-mode imaging, spectral Doppler, color Doppler, and power Doppler as needed of all accessible portions of each vessel. Bilateral testing is considered an integral part of a complete examination. Limited examinations for reoccurring indications may be performed as noted.  Right Findings: +----------+------------+---------+-----------+----------+-------+ RIGHT     CompressiblePhasicitySpontaneousPropertiesSummary +----------+------------+---------+-----------+----------+-------+ Subclavian    Full       Yes       Yes                       +----------+------------+---------+-----------+----------+-------+  Left Findings: +----------+------------+---------+-----------+----------+---------------------+ LEFT      CompressiblePhasicitySpontaneousProperties       Summary        +----------+------------+---------+-----------+----------+---------------------+ IJV           Full       Yes       Yes                                    +----------+------------+---------+-----------+----------+---------------------+ Subclavian    Full       Yes       Yes                                    +----------+------------+---------+-----------+----------+---------------------+  Axillary      Full       Yes       Yes                                    +----------+------------+---------+-----------+----------+---------------------+ Brachial      Full                                                        +----------+------------+---------+-----------+----------+---------------------+ Radial        Full                                    Some segments not                                                        well visualized due                                                         to bandaging/IV    +----------+------------+---------+-----------+----------+---------------------+ Ulnar         Full                                    Some segments not                                                        well visualized due                                                         to bandaging/IV    +----------+------------+---------+-----------+----------+---------------------+ Cephalic      Full                                                        +----------+------------+---------+-----------+----------+---------------------+ Basilic       Full                                                        +----------+------------+---------+-----------+----------+---------------------+   Summary:  Right: No evidence of thrombosis in the subclavian.  Left: No evidence of deep  vein thrombosis in the upper extremity. No evidence of superficial vein thrombosis in the upper extremity.  *See table(s) above for measurements and observations.  Diagnosing physician: Jamelle Haring Electronically signed by Jamelle Haring on 01/10/2021 at 3:43:48 PM.    Final     Labs:  CBC: Recent Labs    01/09/21 0533 01/09/21 1626 01/10/21 0139 01/11/21 0203  WBC 18.5* 22.3* 17.1* 13.2*  HGB 8.6* 7.6* 7.9* 7.9*  HCT 27.4* 24.4* 25.5* 26.0*  PLT 122* 126* 108* 106*    COAGS: Recent Labs    04/19/20 0341 05/13/20 2149 06/29/20 1427 09/26/20 1337 01/08/21 0650  INR  --  1.1 1.0 1.1 1.2  APTT 31 24 30  33  --     BMP: Recent Labs    05/14/20 0614 05/15/20 0058 05/16/20 0110 05/17/20 0631 06/29/20 1427 01/08/21 0515 01/09/21 0533 01/10/21 0139 01/11/21 0203  NA 135 138 140 142   < > 142 144 137 138  K 3.9 3.7 3.8 3.9   < > 3.4* 3.3* 3.4* 4.3  CL 92* 96* 97* 99   < > 90* 99 96* 97*  CO2 34* 33* 35* 35*   < > 38* 37* 33* 36*  GLUCOSE 96 136* 95 91   < > 115* 94 90 114*  BUN 35* 21 9 7*   < > 51* 52* 31* 16  CALCIUM 8.5* 8.7* 9.1 9.3   < > 8.8* 8.7* 8.3* 8.8*  CREATININE 1.87* 0.85 0.55 0.56   < > 0.84 0.76 0.52 0.49  GFRNONAA 27* >60 >60 >60   < > >60 >60 >60 >60  GFRAA 32* >60 >60 >60  --   --   --   --   --    < > = values in this interval not displayed.    LIVER FUNCTION TESTS: Recent Labs    07/08/20 0336 09/26/20 1337 09/27/20 0404 01/04/21 0049  BILITOT 1.8* 0.9 1.0 0.8  AST 18 24 17 23   ALT 21 22 19  36  ALKPHOS 60 114 91 90  PROT 5.5* 6.4* 5.7* 6.2*  ALBUMIN 3.4* 3.0* 2.5* 3.5    Assessment and Plan:  History of mechanical fall with subsequent right rib fractures and right hemothorax s/p right chest tube placement in IR 01/08/2021. Right chest tube stable with approximately 560 cc serosanguinous fluid in pleure-vac, tube to suction with (-) air  leak. Discussed case with Dr. Earleen Newport who recommends water seal trial and possible removal tomorrow pending AM CXR (ordered). Further plans per IMTS- appreciate and agree with management. IR to follow.   Electronically Signed: Earley Abide, PA-C 01/11/2021, 2:56 PM   I spent a total of 15 Minutes at the the patient's bedside AND on the patient's hospital floor or unit, greater than 50% of which was counseling/coordinating care for right hemopneumothorax s/p right chest tube placement.

## 2021-01-11 NOTE — TOC Initial Note (Signed)
Transition of Care (TOC) - Initial/Assessment Note  Marvetta Gibbons RN, BSN Transitions of Care Unit 4E- RN Case Manager See Treatment Team for direct phone #    Patient Details  Name: [name] MRN: [id number] Date of Birth: 02-11-1951  Transition of Care [facility]) CM/SW Contact:    Dawayne Patricia, RN Phone Number: 01/11/2021, 3:42 PM  Clinical Narrative:                 Pt admitted from home, CM spoke with pt regarding transition of care needs- Per MD team pt has been released from her primary care practice- with letter sent to home on 12/26/20.  Per discussion with pt at bedside pt states she does not want to go to a SNF and wants to go home with [facility] services. She has been active with [facility]) and pt states she wants to continue [facility] services with them. Orders have been placed for HHRN/PT/OT/aide. In discussing DME needs- pt reports she has home 02 with [employer]- she did have a  Home NIV however she states that [employer] came and picked it up. Per MD here pt will need new NIV for home- will reach out to [employer] regarding NIV.  Pt stating that she has all other DME needs- no new DME needs other than the NIV.  Pt does report she will need EMS transport assist home, her home is ramp accessible.  Address, phone # confirmed in epic with pt.   TOC also discussed Primary care needs, pt confirmed that she is aware that her primary clinic has dismissed her. She is asking about a doctor that will made home visits. She states that is has been hard to go to doctor's appointments, her daughter does not drive, son works, and she does not drive. Discussed with pt [facility]) group as a possible option- pt agreeable to this writer reaching out to them to see about establishing with them for primary care house visits.  Call made to [facility] [phone] or [phone]) msg left for patient registration - awaiting return call.   Call made to [employer] regarding NIV needs- per  them pt had NIV at home however it was picked up on 10/24/20 with pt signing Edith Endave paperwork- per pt "it just didn't work right". Pt will need new order placed if NIV is needed for discharge- MD has placed order for NIV- and [employer] will start working on process for home NIV.   [facility] also to follow up with pt as pt was in an [facility] primary practice.     Expected Discharge Plan: Skilled Nursing Facility Barriers to Discharge: Continued Medical Work up   Patient Goals and CMS Choice Patient states their goals for this hospitalization and ongoing recovery are:: return home- refusing SNF CMS Medicare.gov Compare Post Acute Care list provided to:: Patient Choice offered to / list presented to : Patient  Expected Discharge Plan and Services Expected Discharge Plan: [place]   Discharge Planning Services: CM Consult Post Acute Care Choice: Home Health,Resumption of Svcs/PTA Provider Living arrangements for the past 2 months: Single Family Home                 DME Arranged: NIV DME Agency: [employer] Date DME Agency Contacted: 01/11/21 Time DME Agency Contacted: [phone]   [place] Agency: [facility] [employer] [facility] [employer]        Prior Living Arrangements/Services Living arrangements for the past  2 months: Single Family Home Lives with:: Self Patient language and need for interpreter reviewed:: Yes Do you feel safe going back to the place where you live?: Yes      Need for Family Participation in Patient Care: Yes (Comment) Care giver support system in place?: Yes (comment) Current home services: DME,Home RN,Home PT Criminal Activity/Legal Involvement Pertinent to Current Situation/Hospitalization: No - Comment as needed  Activities of Daily Living Home Assistive Devices/Equipment: Gilford Rile (specify type) ADL Screening (condition at time of admission) Patient's cognitive ability adequate to safely complete  daily activities?: No Is the patient deaf or have difficulty hearing?: No Does the patient have difficulty seeing, even when wearing glasses/contacts?: No Does the patient have difficulty concentrating, remembering, or making decisions?: No Patient able to express need for assistance with ADLs?: Yes Does the patient have difficulty dressing or bathing?: Yes Independently performs ADLs?: No Communication: Independent Dressing (OT): Needs assistance Walks in Home: Independent with device (comment) Does the patient have difficulty walking or climbing stairs?: Yes Weakness of Legs: Both Weakness of Arms/Hands: None  Permission Sought/Granted Permission sought to share information with : Chartered certified accountant granted to share information with : Yes, Verbal Permission Granted     Permission granted to share info w AGENCY: DME/HH        Emotional Assessment Appearance:: Appears stated age Attitude/Demeanor/Rapport: Engaged Affect (typically observed): Appropriate Orientation: : Oriented to Self,Oriented to Place,Oriented to  Time,Oriented to Situation Alcohol / Substance Use: Not Applicable Psych Involvement: No (comment)  Admission diagnosis:  Traumatic pneumothorax, initial encounter [S27.0XXA] Abrasion of chin with infection, initial encounter [S00.81XA, L08.9] Fall, initial encounter [W19.XXXA] Closed fracture of multiple ribs of right side, initial encounter [S22.41XA] Closed traumatic fracture of ribs of right side with pneumothorax [S22.41XA, S27.0XXA] Fall against object [W18.00XA] Patient Active Problem List   Diagnosis Date Noted  . Lesion of bone of thoracic spine   . Pleural effusion   . Hemothorax   . Fall against object 01/04/2021  . Acute respiratory distress   . Rib fractures   . Pneumothorax   . Multiple closed fractures of ribs of right side 01/02/2021  . Sepsis ([place]) 09/26/2020  . Pneumonia due to COVID-19 virus 07/04/2020  .  Respiratory failure ([place]) 06/30/2020  . Respiratory failure with hypoxia ([place]) 06/29/2020  . Shock ([place]) 05/14/2020  . Chronic, continuous use of opioids 05/14/2020  . Opioid overdose ([place]) 05/14/2020  . Acute on chronic respiratory failure with hypoxia and hypercapnia ([place]) 05/14/2020  . Pressure injury of skin 04/22/2020  . Sepsis due to cellulitis ([place]) 04/18/2020  . Venous stasis dermatitis of both lower extremities 02/25/2019  . Encounter for preventive care 10/20/2018  . Chronic respiratory failure with hypoxia and hypercapnia ([place]) 10/06/2018  . Polycythemia, secondary 10/05/2018  . Obesity hypoventilation syndrome ([place]) 10/04/2018  . AKI (acute kidney injury) ([place])   . BMI 40.0-44.9, adult ([place])   . Bilateral lower extremity edema   . Venous stasis dermatitis 09/30/2018  . Wound of lower extremity, bilateral 09/30/2018  . Chronic heart failure with preserved ejection fraction with moderate RV failure 06/09/2017  . GERD (gastroesophageal reflux disease) 11/13/2014  . Opiate-induced constipation 11/13/2014  . History of tobacco abuse 03/10/2014  . Chronic back pain secondary to traumatic L1 fracture 09/29/2013  . Chronic idiopathic thrombocytopenia ([place]) 08/29/2013  . HTN (hypertension)    PCP:  [name], MD Pharmacy:   [facility] [place] [address] [facility]  [place] [address] Phone: [phone] Fax: [phone]     Social Determinants of Health (SDOH) Interventions    Readmission Risk Interventions Readmission Risk Prevention Plan 09/30/2020  Transportation Screening Complete  Medication Review Press photographer) Complete  PCP or Specialist appointment within 3-5 days of discharge Complete  HRI or [place] Complete  SW Recovery Care/Counseling Consult Complete  [place] Patient Refused  Some recent data might be  hidden

## 2021-01-11 NOTE — Discharge Summary (Addendum)
Name: [name] MRN: [id number] DOB: 05-28-1951 70 y.o. PCP: [name], MD  Date of Admission: 01/02/2021  3:31 PM Date of Discharge:  01/22/2019 Attending Physician: Dr. Johnnette Gourd MD  Discharge Diagnosis: 1. Traumatic rib fractures 2. Traumatic hemothorax 3. Chronic ITP 4. Acute on chronic hypercapnic and hypoxic respiratory failure 5. Obesity hypoventilation syndrome 6. Obstructive sleep apnea 7. Symptomatic anemia 8. HFpEF 9. T12 lesion suspicious for tumor versus infection 10. Chronic Respiratory failure secondary to Presumed COPD 11. BIPAP/NIV dependent  Discharge Medications: Allergies as of 01/21/2021   No Known Allergies     Medication List    STOP taking these medications   diphenhydramine-acetaminophen 25-500 MG Tabs tablet Commonly known as: TYLENOL PM   promethazine 12.5 MG tablet Commonly known as: PHENERGAN     TAKE these medications   Baclofen 5 MG Tabs Take 5 mg by mouth 3 (three) times daily as needed for up to 10 days for muscle spasms.   carvedilol 3.125 MG tablet Commonly known as: COREG Take 1 tablet (3.125 mg total) by mouth 2 (two) times daily with a meal.   cyanocobalamin 1000 MCG tablet Take 1 tablet (1,000 mcg total) by mouth daily.   furosemide 40 MG tablet Commonly known as: LASIX Take 1 tablet (40 mg total) by mouth daily. Start taking on: Jan 22, 2021 What changed:   medication strength  how much to take  when to take this  additional instructions   loperamide 2 MG capsule Commonly known as: IMODIUM Take 1 capsule (2 mg total) by mouth as needed for diarrhea or loose stools.   oxyCODONE-acetaminophen 5-325 MG tablet Commonly known as: PERCOCET/ROXICET Take 1 tablet by mouth every 6 (six) hours as needed for up to 3 days for moderate pain.   potassium chloride 10 MEQ tablet Commonly known as: KLOR-CON TAKE 1 TABLET(10 MEQ) BY MOUTH DAILY What changed: See the new instructions.            Durable  Medical Equipment  (From admission, onward)         Start     Ordered   01/11/21 1521  For home use only DME Bipap  Once       Question Answer Comment  Length of Need Lifetime   Keep 02 saturation 88-92      01/11/21 1522          Disposition and follow-up:   [name] was discharged from [facility] in Stable condition.  At the hospital follow up visit please address:  1.  Follow up: . T12 lesion suspicious for malignancy or infection: Patient declined biopsy this admission because she "has a lot going on," follow-up outpatient for biopsy, follow-up with neurosurgery . Chronic respiratory failure due to OHS, OSA, HFpEF, Presumed COPD exacerbated by traumatic rib fractures and hemothorax: Discharged with NIV, continue preadmission home oxygen of 3 to 4 L by nasal cannula, multimodal pain control. Continued use of NIV is recommended.  Marland Kitchen HFpEF: Lasix were held initially during this admission while she was volume down due to n.p.o. status on BiPAP, restarted at 40 mg and Pt discharged on same. F/U with PCP.  2.  Labs / imaging needed at time of follow-up: T12 biopsy by IR ideally  3.  Pending labs/ test needing follow-up: None  Follow-up Appointments:  Contact information for follow-up providers    [facility] Follow up.   Specialty: Geriatric Medicine Why: Online registration has been received - under  review 48hr- they will reach out to you by 01/18/21 to schedule a tele health visit post discharge.  Contact information: [place] [address] [phone]        [facility] Follow up.   Specialty: Home Health Services Why: HHRN/PT/OT/aide arranged- they will contact you post discharge to schedule visits Contact information: [place] [address] [phone]        [facility] Follow up.   Why: NIV arranged- to be delivered on discharge to home once you  arrive home.  Contact information: [place] [address] [phone]        [facility]. Call.   Why: [facility] [address] [place], [place] [address] [phone] Contact information: [facility] information for after-discharge care    [facility] .   Service: Skilled Nursing Contact information: [address] [facility] Course by problem list:  # Acute on chronic hypercapneic and hypoxic respiratory failure, on 3-4L chronically # Ribs fracture due to mechanical fall # Traumatic hemothorax in the setting of chronic ITP # Symptomatic anemia Patient has chronic hypoxic respiratory failure in the setting of OSA, OHS, and HFpEF.  She is typically on 3 to 4 L at home.  She presented after a mechanical fall and striking her back on her tub.  Found to have fractures of her right posterior ribs 9 and 10.  Likely due to pain and shallow respirations, she developed acute on chronic hypercapnic respiratory failure with encephalopathy.  She was placed on BiPAP and had improvement in mental status.  Over the next 4 days, we attempted weaning back to her nasal cannula several times but failed every time.  Her clinical status is very tenuous at this time and we have discussed goals of care with the patient and her daughter and son, they were all in agreement to resume full scope of care.  During this time we performed a thoracentesis which showed 800 cc of frank blood.  This reaccumulated.  After weighing the pros and cons, the team decided to insert a pigtail catheter rather than a large bore chest tube due to her respiratory status difficulty positioning.  This catheter drained 1.4 L soon after insertion, and afterwards had decreasing output and was finally discontinued.  Patient  had marked clinical improvement after the chest tube insertion and has been on 1 to 2 L by nasal cannula which is below her home oxygen requirement.  Pt Hb dropped to 7. She received 1 unit of PRB's. Hb stable after that and Hb at the time of discharge is stable at 8.4. She has been continued on BiPAP at night which she will require at night chronically. She had this in the past, but cannot figure out the machine and so was taken back by the company. Home NIV approved by insurance. Pt's pain and muscle spasms controlled by IV fentanyl, Lidocaine patch, Baclofen and Tylenol. IV fentanyl was discontinued later due to quick wearing out and Percocet was started to ease out pain. PT and OT recommended her to to go to SNF but Pt declined initially and wanted to go home with home health PT  but later agreed to go to SNF. Notably, this patient was seen in our clinic for primary care purposes.  However, she has wanted and able to come to appointments consistently and we have not seen her in person in about 2 years.  She has poor mobility and transportation issues as well.  Social worker tried to arrange a primary care doctor who can provide home visits but unfortunately by per TC not able to find one. TOC helped Pt in registering for tele health, we can continue her home health orders until she establishes with them.   # T12 destructive change suspicious for tumor MRI with change at T12 concerning for malignant or infectious etiology.  No clear primary at this point. Has never had a mammogram, breast exam without overt abnormality. 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 Patient was taken down to IR for biopsy , but she refused it. Spoke with neurosurgery who said this was not necessarily an urgent procedure and can be done outpatient. Defer to outpatient setting for biopsy and follow-up.  Chronic HFpEF OSA BP stable. Lasix, Coreg and KLOR held initially  due to being volume down. Started on half of her home dose at lasix 40 mg daily. BP remained stable. Coreg and KLOR restarted at discharge. Pt on 2L O2 at discharged and requires BiPAP at night. Recommend continued use of NIV.   Chronic Venous Stasis Unna wrappings were used during inpatient hospitalization.  Subjective on day of discharge: Pt  is sitting in chair comfortably. Denies SOB and Chest pain. Denies any complaints.   Discharge Exam:   BP (!) 133/58 (BP Location: Right Arm)   Pulse 80   Temp 98.1 F (36.7 C) (Oral)   Resp 18   Ht 5' (1.524 m)   Wt 86.5 kg   SpO2 95%   BMI 37.24 kg/m  Discharge exam: Physical Exam Vitals and nursing note reviewed.  Constitutional:      General: She is not in acute distress.    Appearance: Normal appearance. She is obese. She is not ill-appearing, toxic-appearing or diaphoretic.  HENT:     Head: Normocephalic and atraumatic.  Cardiovascular:     Rate and Rhythm: Normal rate and regular rhythm.     Heart sounds: No murmur heard. No friction rub. No gallop.   Pulmonary:     Effort: Pulmonary effort is normal.     Breath sounds: Normal breath sounds.  Abdominal:     General: Bowel sounds are normal.     Palpations: Abdomen is soft.  Musculoskeletal:     Cervical back: Normal range of motion.  Skin:    General: Skin is warm and dry.  Neurological:     General: No focal deficit present.     Mental Status: She is alert and oriented to person, place, and time.  Psychiatric:        Mood and Affect: Mood normal.        Behavior: Behavior normal.     Pertinent Labs, Studies, and Procedures:  CT Abdomen Pelvis Wo Contrast  Result Date: 01/02/2021 CLINICAL DATA:  Patient status post slip and fall today. EXAM: CT ABDOMEN AND PELVIS WITHOUT CONTRAST TECHNIQUE: Multidetector CT imaging of the abdomen and pelvis was performed following the standard protocol without IV contrast. COMPARISON:  CT abdomen and pelvis 11/13/2014. CT chest  earlier today and 09/26/2020. FINDINGS: Lower chest: Basilar airspace disease and small right basilar  pneumothorax or unchanged compared to the patient's chest CT earlier today. Hepatobiliary: Low attenuating lesion in the posterior right hepatic lobe compatible with hemangioma is unchanged since prior CT abdomen and pelvis. No other focal liver lesion. Multiple stones are seen in the gallbladder but there is no evidence of cholecystitis. The biliary tree appears normal. Pancreas: Unremarkable. No pancreatic ductal dilatation or surrounding inflammatory changes. Spleen: Normal in size without focal abnormality. Adrenals/Urinary Tract: Adrenal glands are unremarkable. Kidneys are normal, without renal calculi, focal lesion, or hydronephrosis. Bladder is unremarkable. Stomach/Bowel: Stomach is within normal limits. No evidence of appendicitis. No evidence of bowel wall thickening, distention, or inflammatory changes. Vascular/Lymphatic: Aortic atherosclerosis. No enlarged abdominal or pelvic lymph nodes. The aorta is markedly tortuous with a straight right lateral orientation at the level of L2, unchanged. Reproductive: Status post hysterectomy. No adnexal masses. Other: Large ventral hernia is unchanged. Musculoskeletal: As seen on the prior exam, the patient is status post L1 corpectomy. There is extensive bony destructive change throughout the T12 vertebral body which appears new or worse compared to the patient's most recent chest CT. Marked subsidence of the corpectomy strut into the L2 vertebral body is unchanged. Remote compression fractures are seen at all remaining imaged thoracic and lumbar levels. IMPRESSION: Status post L1 corpectomy as seen on prior exams. Bony destructive change throughout the T12 vertebral body appears new or at least worse compared to the most recent comparison dated 09/26/2020. This finding could be due to infection, inflammatory change or possibly tumor. The appearance is not  suggestive of fracture due to fall. MRI with and without contrast is the best next test for further evaluation. Remote compression fractures at all imaged thoracic and lumbar levels. Large ventral hernia. Gallstones without evidence of cholecystitis. Bibasilar airspace disease and small right basilar pneumothorax as seen on chest CT today. Electronically Signed   By: Inge Rise M.D.   On: 01/02/2021 20:03   CT Head Wo Contrast  Result Date: 01/02/2021 CLINICAL DATA:  70 year old female with fall. EXAM: CT HEAD WITHOUT CONTRAST CT CERVICAL SPINE WITHOUT CONTRAST TECHNIQUE: Multidetector CT imaging of the head and cervical spine was performed following the standard protocol without intravenous contrast. Multiplanar CT image reconstructions of the cervical spine were also generated. COMPARISON:  Head CT dated 05/13/2020. FINDINGS: CT HEAD FINDINGS Brain: The ventricles and sulci appropriate size for patient's age. The gray-white matter discrimination is preserved. There is no acute intracranial hemorrhage. No mass effect or midline shift. No extra-axial fluid collection. Vascular: No hyperdense vessel or unexpected calcification. Skull: Normal. Negative for fracture or focal lesion. Sinuses/Orbits: Mild mucoperiosteal thickening of paranasal sinuses. No air-fluid level. Mastoid air cells are clear. Other: None CT CERVICAL SPINE FINDINGS Alignment: No acute subluxation. Skull base and vertebrae: No acute fracture. Osteopenia. Soft tissues and spinal canal: No prevertebral fluid or swelling. No visible canal hematoma. Disc levels:  No acute findings. Upper chest: Mild emphysema. Minimal right apical pneumothorax. Please see report for the chest CT. Other: None IMPRESSION: 1. No acute intracranial pathology. 2. No acute/traumatic cervical spine pathology. 3. Minimal right apical pneumothorax. Please see report for the chest CT. Electronically Signed   By: Anner Crete M.D.   On: 01/02/2021 16:46   CT Chest  Wo Contrast  Result Date: 01/02/2021 CLINICAL DATA:  Suspected rib fracture.  Fall. EXAM: CT CHEST WITHOUT CONTRAST TECHNIQUE: Multidetector CT imaging of the chest was performed following the standard protocol without IV contrast. COMPARISON:  09/26/2020 FINDINGS: Cardiovascular: Tortuous aorta. No  aneurysm. Scattered coronary artery and aortic calcifications. Heart is borderline in size. Mediastinum/Nodes: No mediastinal, hilar, or axillary adenopathy. Trachea and esophagus are unremarkable. Bilateral thyroid nodules, the largest 12 mm in the right thyroid lobe. Not clinically significant; no follow-up imaging recommended (ref: J Am Coll Radiol. 2015 Feb;12(2): 143-50). Lungs/Pleura: Linear areas of scarring or atelectasis in the mid and lower lungs bilaterally. No effusions. Small right pneumothorax noted in the lower chest. Upper Abdomen: Low-density lesion posteriorly in the liver measures 3.8 cm, similar to prior study. No acute findings. Musculoskeletal: Posterior right 9th and 10th rib fractures noted. No additional acute bony abnormality. Chest wall soft tissues are unremarkable. IMPRESSION: Posterior right 9th and 10th rib fractures. Tiny right basilar pneumothorax. Areas of atelectasis or scarring in the mid and lower lungs. Coronary artery disease. Aortic Atherosclerosis (ICD10-I70.0). Electronically Signed   By: Rolm Baptise M.D.   On: 01/02/2021 16:50   CT Cervical Spine Wo Contrast  Result Date: 01/02/2021 CLINICAL DATA:  70 year old female with fall. EXAM: CT HEAD WITHOUT CONTRAST CT CERVICAL SPINE WITHOUT CONTRAST TECHNIQUE: Multidetector CT imaging of the head and cervical spine was performed following the standard protocol without intravenous contrast. Multiplanar CT image reconstructions of the cervical spine were also generated. COMPARISON:  Head CT dated 05/13/2020. FINDINGS: CT HEAD FINDINGS Brain: The ventricles and sulci appropriate size for patient's age. The gray-white matter  discrimination is preserved. There is no acute intracranial hemorrhage. No mass effect or midline shift. No extra-axial fluid collection. Vascular: No hyperdense vessel or unexpected calcification. Skull: Normal. Negative for fracture or focal lesion. Sinuses/Orbits: Mild mucoperiosteal thickening of paranasal sinuses. No air-fluid level. Mastoid air cells are clear. Other: None CT CERVICAL SPINE FINDINGS Alignment: No acute subluxation. Skull base and vertebrae: No acute fracture. Osteopenia. Soft tissues and spinal canal: No prevertebral fluid or swelling. No visible canal hematoma. Disc levels:  No acute findings. Upper chest: Mild emphysema. Minimal right apical pneumothorax. Please see report for the chest CT. Other: None IMPRESSION: 1. No acute intracranial pathology. 2. No acute/traumatic cervical spine pathology. 3. Minimal right apical pneumothorax. Please see report for the chest CT. Electronically Signed   By: Anner Crete M.D.   On: 01/02/2021 16:46   MR THORACIC SPINE WO CONTRAST  Result Date: 01/03/2021 CLINICAL DATA:  [place].  Back pain. EXAM: MRI THORACIC SPINE WITHOUT CONTRAST TECHNIQUE: Multiplanar, multisequence MR imaging of the thoracic spine was performed. No intravenous contrast was administered. COMPARISON:  CT scan 01/02/2021 FINDINGS: Examination is limited by patient motion and the patient could not complete the examination. Alignment: Exaggerated thoracic kyphosis. The thoracic vertebral bodies are grossly normally aligned. Vertebrae: As demonstrated on the CT scan the T12 vertebral body is partially destroyed. There is abnormal low T1 and high T2 and STIR signal intensity suspicious for tumor. No spinal canal compromise. I do not see any similar findings involving the other vertebral bodies. Remote compression fractures are noted in the thoracic spine and there are T4 and T8 hemangioma is noted. Cord:  Grossly normal thoracic spinal cord. Paraspinal and other soft tissues: No  significant paraspinal findings. Disc levels: No obvious large thoracic disc protrusions or significant canal compromise. IMPRESSION: 1. Limited examination due to patient motion and the patient could not complete the examination. 2. The T12 vertebral body is partially destroyed and demonstrates abnormal signal intensity suspicious for tumor. No spinal canal compromise. 3. Remote compression fractures of the thoracic spine. 4. No obvious large thoracic disc protrusions or significant canal compromise. Electronically  Signed   By: Marijo Sanes M.D.   On: 01/03/2021 17:32   DG Chest Port 1 View  Result Date: 01/03/2021 CLINICAL DATA:  Acute respiratory distress EXAM: PORTABLE CHEST 1 VIEW COMPARISON:  01/03/2021 FINDINGS: Cardiac shadow is enlarged but stable. Patchy atelectatic changes are noted within both lungs which has progressed in the interval from the film earlier in the day. Postsurgical changes in the lower thoracic spine are noted. Small right-sided pleural effusion is seen. IMPRESSION: Progressive atelectasis when compare with the earlier film. Small right pleural effusion. Electronically Signed   By: Inez Catalina M.D.   On: 01/03/2021 21:28   DG CHEST PORT 1 VIEW  Result Date: 01/03/2021 CLINICAL DATA:  Rib fractures after fall in bathroom yesterday EXAM: PORTABLE CHEST 1 VIEW COMPARISON:  Chest CT 01/02/2021 FINDINGS: Low volume chest with hazy and streaky density at the bases. Cardiopericardial enlargement. No visible effusion or pneumothorax. Osseous structures are better depicted by prior CT. IMPRESSION: Extensive atelectasis that is progressed from yesterday. Electronically Signed   By: Monte Fantasia M.D.   On: 01/03/2021 07:23      Discharge Instructions: Discharge Instructions    Diet - low sodium heart healthy   Complete by: As directed    Discharge instructions   Complete by: As directed    [name],   It was a pleasure taking care of you here in the hospital. You were  admitted and treated for a hemothorax and rib fractures.   You will be discharged to the skilled nursing facility.   Please follow up with your primary care physician. You will also need a pulmonary function test performed.   You were also discovered to have a lesion on one of the bones of your spine and will require biopsy. Please call  [facility] [address] [place], [place] [address] [phone]  Take Care!   Increase activity slowly   Complete by: As directed    No wound care   Complete by: As directed       Signed: Armando Reichert, MD 01/21/2021, 3:24 PM   Pager: [phone]

## 2021-01-12 ENCOUNTER — Inpatient Hospital Stay (HOSPITAL_COMMUNITY): Payer: Medicare PPO

## 2021-01-12 LAB — CBC
HCT: 24.7 % — ABNORMAL LOW (ref 36.0–46.0)
Hemoglobin: 7.9 g/dL — ABNORMAL LOW (ref 12.0–15.0)
MCH: 29 pg (ref 26.0–34.0)
MCHC: 32 g/dL (ref 30.0–36.0)
MCV: 90.8 fL (ref 80.0–100.0)
Platelets: 86 10*3/uL — ABNORMAL LOW (ref 150–400)
RBC: 2.72 MIL/uL — ABNORMAL LOW (ref 3.87–5.11)
RDW: 18.4 % — ABNORMAL HIGH (ref 11.5–15.5)
WBC: 12.6 10*3/uL — ABNORMAL HIGH (ref 4.0–10.5)
nRBC: 0.3 % — ABNORMAL HIGH (ref 0.0–0.2)

## 2021-01-12 LAB — BASIC METABOLIC PANEL
Anion gap: 9 (ref 5–15)
Anion gap: 6 (ref 5–15)
BUN: 12 mg/dL (ref 8–23)
BUN: 11 mg/dL (ref 8–23)
CO2: 28 mmol/L (ref 22–32)
CO2: 31 mmol/L (ref 22–32)
Calcium: 8.2 mg/dL — ABNORMAL LOW (ref 8.9–10.3)
Calcium: 8.4 mg/dL — ABNORMAL LOW (ref 8.9–10.3)
Chloride: 97 mmol/L — ABNORMAL LOW (ref 98–111)
Chloride: 101 mmol/L (ref 98–111)
Creatinine, Ser: 0.52 mg/dL (ref 0.44–1.00)
Creatinine, Ser: 0.44 mg/dL (ref 0.44–1.00)
GFR, Estimated: >60 mL/min (ref >60)
GFR, Estimated: >60 mL/min (ref >60)
Glucose, Bld: 129 mg/dL — ABNORMAL HIGH (ref 70–99)
Glucose, Bld: 106 mg/dL — ABNORMAL HIGH (ref 70–99)
Potassium: 5.5 mmol/L — ABNORMAL HIGH (ref 3.5–5.1)
Potassium: 4.6 mmol/L (ref 3.5–5.1)
Sodium: 134 mmol/L — ABNORMAL LOW (ref 135–145)
Sodium: 138 mmol/L (ref 135–145)

## 2021-01-12 MED ORDER — FUROSEMIDE 40 MG PO TABS
40.0000 mg | ORAL_TABLET | Freq: Every day | ORAL | Status: DC
  Administered 2021-01-13 – 2021-01-21 (×9): 40 mg via ORAL
  Filled 2021-01-12 (×9): qty 1

## 2021-01-12 NOTE — Evaluation (Addendum)
Occupational Therapy Re-evaluation Patient Details Name: [name] MRN: [id number] DOB: 1951-05-31 Today's Date: 01/12/2021    History of Present Illness 70 y/o F with hx of chronic respiratory failure on 3-4L chronically, morbid obesity, HFpEF, chronic ITP, chronic leg wounds who presents with rib fractures after mechanical fall. Acute on chronic hypercapneic and hypoxic respiratory failure, also found to have hemothorax. Respiratory status now much improved after placing chest tube, she is maintaining sats on less than her home oxygen.   Clinical Impression   OT re-evaluation completed today and added back onto OT services. Pt admitted with the above diagnoses and presents with below problem list. Pt will benefit from continued acute OT to address the below listed deficits and maximize independence with basic ADLs prior to d/c to venue below. Pt reports at baseline she utilizes a rw to walk short, functional distances in the home, aide assists with bathing 1x/wk. Pt reports using a BSC and struggling with managing cleaning BSC out. Currently pt needs +2 mod-max A with bed mobility, up to mod A with grooming and UB ADL tasks sitting EOB, total A +2 for LB ADLs. Pt able to sit EOB several minutes at min guard level. Pt refused attempting to stand 2/2 pain and fear of increased pain. Full OT session details below.     Follow Up Recommendations  SNF;Supervision/Assistance - 24 hour (Pt needs SNF however at this time is refusing and requesting [facility]    Equipment Recommendations  Tub/shower bench    Recommendations for Other Services       Precautions / Restrictions Precautions Precautions: Fall Precaution Comments: rib fxs, R chest tube, flexiseal Other Brace: on 3L O2 [place] at home Restrictions Weight Bearing Restrictions: No      Mobility Bed Mobility Overal bed mobility: Needs Assistance Bed Mobility: Supine to Sit;Sit to Supine     Supine to sit: Mod assist;Max assist;+2 for  physical assistance;HOB elevated Sit to supine: Mod assist;+2 for physical assistance   General bed mobility comments: +2 assist needed: mod A to advance BLE, max A to powerup trunk. Pt very guarded with R flank and limited in ability to pull on bed rail with R hand to facilitate powerup. Assist to advance BLE back onto bed and control descent    Transfers                 General transfer comment: Pt refused secondary to pain    Balance Overall balance assessment: Needs assistance Sitting-balance support: Bilateral upper extremity supported;Single extremity supported;Feet supported Sitting balance-Leahy Scale: Fair Sitting balance - Comments: sits with BUE support though was able to sit with single UE support briefly to wash face.                                   ADL either performed or assessed with clinical judgement   ADL Overall ADL's : Needs assistance/impaired Eating/Feeding: Set up;Bed level Eating/Feeding Details (indicate cue type and reason): Pt needs all containers and lids open and food placed in front of her. Grooming: Dance movement psychotherapist;Set up;Min guard;Brushing hair;Moderate assistance Grooming Details (indicate cue type and reason): able to wash face with setup while sitting EOB with single UE support. Assist for overhead and/or bilateral tasks (in unsupported sitting) such as combing hair. Upper Body Bathing: Maximal assistance;Sitting Upper Body Bathing Details (indicate cue type and reason): sat EOB several minutes at min guard level. Hesitant to transition  from BUE support to single UE support impacting assist level with UB ADLs Lower Body Bathing: Total assistance;Bed level   Upper Body Dressing : Sitting;Moderate assistance Upper Body Dressing Details (indicate cue type and reason): sat EOB several minutes at min guard level. Hesitant to transition from [place] support to single UE support impacting assist level with UB ADLs Lower Body Dressing: Total  assistance;Bed level                 General ADL Comments: Pt completed bed mobility with +2 assist, sat EOB several minutes mostly with BUE support but, with encouragement, able to wash face single-handedly. Self-limiting behaviors.     Vision Baseline Vision/History: Wears glasses;Retinopathy       Perception     Praxis      Pertinent Vitals/Pain Pain Assessment: Faces Faces Pain Scale: Hurts worst Pain Location: yelling with transitional movements. R flank, back pain sitting EOB. BLE pain. Pain Descriptors / Indicators: Grimacing;Guarding;Moaning Pain Intervention(s): Monitored during session;Limited activity within patient's tolerance;Repositioned     Hand Dominance Right   Extremity/Trunk Assessment Upper Extremity Assessment Upper Extremity Assessment: Generalized weakness;RUE deficits/detail RUE Deficits / Details: difficulty to fully assess due to increased R flank pain with R shoulder ROM.   Lower Extremity Assessment Lower Extremity Assessment: Defer to PT evaluation   Cervical / Trunk Assessment Cervical / Trunk Assessment: Other exceptions Cervical / Trunk Exceptions: h/o lumbar fusion and chronic back pain   Communication Communication Communication: No difficulties   Cognition Arousal/Alertness: Awake/alert Behavior During Therapy: Anxious Overall Cognitive Status: No family/caregiver present to determine baseline cognitive functioning                                 General Comments: answers questions appropriately and appears to be reliable historian from cognitive standpoint. High anxiety with movement. Quick to state she feels she cannot do a task but with extra time and encouragement completed most tasks presented.   General Comments       Exercises     Shoulder Instructions      Home Living Family/patient expects to be discharged to:: Private residence Living Arrangements: Children (daughter and son in law live with  HER) Available Help at Discharge: Family;Available 24 hours/day (daughter works from home) Type of Home: House Home Access: [place]: One level     Bathroom Shower/Tub: Teacher, early years/pre:  (uses BSC) Bathroom Accessibility: Yes How Accessible: Accessible via walker Home Equipment: [place] - 2 wheels;Cane - single point;Wheelchair - manual;Bedside commode          Prior Functioning/Environment Level of Independence: Needs assistance  Gait / Transfers Assistance Needed: uses a walker to ambulate at home, sleeps in a recliner (no longer has a lift chair) ADL's / Homemaking Assistance Needed: daughter intermittently assist with emptying BSC per pt report, does housework and cooking.  Pt has an Therapist, sports that comes in and wraps her legs (for chornic wounds). Pt reports an aide comes about 1x per week to assist with bathing.            OT Problem List: Decreased strength;Decreased range of motion;Decreased activity tolerance;Impaired balance (sitting and/or standing);Decreased coordination;Decreased cognition;Decreased safety awareness;Decreased knowledge of use of DME or AE;Cardiopulmonary status limiting activity;Pain      OT Treatment/Interventions: Self-care/ADL training;Therapeutic exercise;Energy conservation;DME and/or AE instruction;Therapeutic activities;Cognitive remediation/compensation;Patient/family education;Balance training    OT Goals(Current goals can  be found in the care plan section) Acute Rehab OT Goals Patient Stated Goal: to go home OT Goal Formulation: With patient Time For Goal Achievement: 01/25/21 Potential to Achieve Goals: Good  OT Frequency: Min 2X/week   Barriers to D/C:    currently needs +2 assist and refusing to try attempts stands 2/2 pain. Pt adamant she is going home but has not gotten OOB this admission to my knowledge. Currently utilizing flexiseal and puriwick.       Co-evaluation PT/OT/SLP  Co-Evaluation/Treatment: Yes Reason for Co-Treatment: Complexity of the patient's impairments (multi-system involvement);For patient/therapist safety;To address functional/ADL transfers   OT goals addressed during session: ADL's and self-care;Strengthening/ROM      AM-PAC OT "6 Clicks" Daily Activity     Outcome Measure Help from another person eating meals?: A Little Help from another person taking care of personal grooming?: A Little Help from another person toileting, which includes using toliet, bedpan, or urinal?: Total Help from another person bathing (including washing, rinsing, drying)?: Total (+2 to roll) Help from another person to put on and taking off regular upper body clothing?: A Little Help from another person to put on and taking off regular lower body clothing?: Total 6 Click Score: 12   End of Session Equipment Utilized During Treatment: Oxygen (2L)  Activity Tolerance: Patient limited by pain Patient left: in bed;with call bell/phone within reach;with bed alarm set  OT Visit Diagnosis: Unsteadiness on feet (R26.81);Other abnormalities of gait and mobility (R26.89);Muscle weakness (generalized) (M62.81)                Time: 4010-2725 OT Time Calculation (min): 36 min Charges:  OT General Charges $OT Visit: 1 Visit OT Evaluation $OT Re-eval: 1 Re-eval  Tyrone Schimke, OT Acute Rehabilitation Services Pager: [phone] Office: [phone]   Hortencia Pilar 01/12/2021, 12:49 PM

## 2021-01-12 NOTE — Progress Notes (Signed)
Pt placed on bipap, avaps mode for the night.

## 2021-01-12 NOTE — Evaluation (Signed)
Physical Therapy Re-Evaluation Patient Details Name: [name] MRN: [id number] DOB: [birth year] Today's Date: 01/12/2021   History of Present Illness  70 y/o F with hx of chronic respiratory failure on 3-4L chronically, morbid obesity, HFpEF, chronic ITP, chronic leg wounds who presents with rib fractures after mechanical fall. Acute on chronic hypercapneic and hypoxic respiratory failure, also found to have hemothorax. Had thoracentesis with removal of 600cc of frank blood on 4/23 & chest tube placed 4/26.  Respiratory status now much improved after placing chest tube, she is maintaining sats on less than her home oxygen.  Clinical Impression  Patient seen after multiple medical issues prohibiting therapy.  She was able to tolerate EOB through very anxious and continuing to request to not stand today.  She is very dependent on help and though lives with daughter and son in law feel she may need SNF as previously fall due to emptying her own [facility] since she states her daughter "gripes" about it.  Feel she may continue to refuse so would need HHaide, HHRN and HHPT at d/c and ambulance transport home.  PT to continue to follow acutely.     Follow Up Recommendations SNF;Supervision/Assistance - 24 hour (if continues to refuse SNF needs HHPT)    Equipment Recommendations  None recommended by PT    Recommendations for Other Services       Precautions / Restrictions Precautions Precautions: Fall Precaution Comments: rib fxs, R chest tube, flexiseal, O2 dependent Other Brace: on 3L O2 [place] at home Restrictions Weight Bearing Restrictions: No      Mobility  Bed Mobility Overal bed mobility: Needs Assistance Bed Mobility: Supine to Sit;Sit to Supine     Supine to sit: Mod assist;Max assist;+2 for physical assistance;HOB elevated Sit to supine: Mod assist;+2 for physical assistance   General bed mobility comments: +2 assist needed: mod A to advance BLE, max A to powerup trunk. Pt very  guarded with R flank and limited in ability to pull on bed rail with R hand to facilitate powerup. Assist to advance BLE back onto bed and control descent    Transfers Overall transfer level: Needs assistance   Transfers: Lateral/Scoot Transfers          Lateral/Scoot Transfers: +2 physical assistance;Mod assist General transfer comment: scooting up toward HOB x 2 pt using bed rail  Ambulation/Gait                Stairs            Wheelchair Mobility    Modified Rankin (Stroke Patients Only)       Balance Overall balance assessment: Needs assistance Sitting-balance support: Feet supported;Single extremity supported Sitting balance-Leahy Scale: Poor Sitting balance - Comments: UE support and S for sitting balance, washed face, but eager for others to help                                     Pertinent Vitals/Pain Pain Assessment: Faces Faces Pain Scale: Hurts worst Pain Location: yelling with transitional movements. R flank, back pain sitting EOB. BLE pain. Pain Descriptors / Indicators: Grimacing;Guarding;Moaning Pain Intervention(s): Monitored during session;Repositioned;Patient requesting pain meds-RN notified    Home Living Family/patient expects to be discharged to:: Private residence Living Arrangements: Children (daughter and son in law live with her) Available Help at Discharge: Family;Available 24 hours/day Type of Home: House Home Access: Ramped entrance     Home Layout:  One level Home Equipment: [place] - 2 wheels;Cane - single point;Wheelchair - manual;Bedside commode      Prior Function Level of Independence: Needs assistance   Gait / Transfers Assistance Needed: uses a walker to ambulate at home, sleeps in a recliner (no longer has a lift chair)  ADL's / Homemaking Assistance Needed: daughter intermittently assist with emptying BSC per pt report, does housework and cooking.  Pt has an Therapist, sports that comes in and wraps her legs  (for chornic wounds). Pt reports an aide comes about 1x per week to assist with bathing.        Hand Dominance   Dominant Hand: Right    Extremity/Trunk Assessment   Upper Extremity Assessment Upper Extremity Assessment: Defer to OT evaluation RUE Deficits / Details: difficulty to fully assess due to increased R flank pain with R shoulder ROM.    Lower Extremity Assessment Lower Extremity Assessment: Generalized weakness    Cervical / Trunk Assessment Cervical / Trunk Assessment: Kyphotic Cervical / Trunk Exceptions: h/o lumbar fusion and chronic back pain  Communication   Communication: No difficulties  Cognition Arousal/Alertness: Awake/alert Behavior During Therapy: Anxious Overall Cognitive Status: No family/caregiver present to determine baseline cognitive functioning                     Current Attention Level: Focused;Sustained   Following Commands: Follows one step commands with increased time;Follows one step commands consistently       General Comments: self limiting and asking for more tasks before caregivers can complete one task.      General Comments General comments (skin integrity, edema, etc.): HR max 101, SpO2 94 and higher on 2L O2    Exercises General Exercises - Lower Extremity Heel Slides: AAROM;5 reps;Both;Supine   Assessment/Plan    PT Assessment Patient needs continued PT services  PT Problem List Decreased strength;Decreased mobility;Decreased activity tolerance;Decreased balance;Decreased knowledge of use of DME;Pain;Cardiopulmonary status limiting activity;Obesity;Decreased range of motion       PT Treatment Interventions DME instruction;Gait training;Therapeutic exercise;Therapeutic activities;Patient/family education;Balance training;Functional mobility training    PT Goals (Current goals can be found in the Care Plan section)  Acute Rehab PT Goals Patient Stated Goal: to go home PT Goal Formulation: With patient Time  For Goal Achievement: 01/26/21 Potential to Achieve Goals: Good    Frequency Min 3X/week   Barriers to discharge        Co-evaluation PT/OT/SLP Co-Evaluation/Treatment: Yes Reason for Co-Treatment: Complexity of the patient's impairments (multi-system involvement);To address functional/ADL transfers PT goals addressed during session: Mobility/safety with mobility;Balance;Strengthening/ROM OT goals addressed during session: ADL's and self-care;Strengthening/ROM       AM-PAC PT "6 Clicks" Mobility  Outcome Measure Help needed turning from your back to your side while in a flat bed without using bedrails?: Total Help needed moving from lying on your back to sitting on the side of a flat bed without using bedrails?: Total Help needed moving to and from a bed to a chair (including a wheelchair)?: Total Help needed standing up from a chair using your arms (e.g., wheelchair or bedside chair)?: Total Help needed to walk in hospital room?: Total Help needed climbing 3-5 steps with a railing? : Total 6 Click Score: 6    End of Session Equipment Utilized During Treatment: Oxygen Activity Tolerance: Patient limited by pain Patient left: in bed;with call bell/phone within reach;with bed alarm set Nurse Communication: Mobility status PT Visit Diagnosis: Muscle weakness (generalized) (M62.81);Difficulty in walking, not elsewhere classified (R26.2);Pain Pain -  Right/Left: Right Pain - part of body:  (back)    Time: 9450-3888 PT Time Calculation (min) (ACUTE ONLY): 37 min   Charges:   PT Evaluation $PT Re-evaluation: 1 Re-eval          Magda Kiel, PT Acute Rehabilitation Services KCMKL:[phone] [phone] 01/12/2021   Reginia Naas 01/12/2021, 1:35 PM

## 2021-01-12 NOTE — Progress Notes (Addendum)
Referring Physician(s): Velna Ochs (IMTS)  Supervising Physician: Mir, Sharen Heck  Patient Status:  [facility] - In-pt  Chief Complaint: right chest tube F/U  Subjective:  History of mechanical fall with subsequent right rib fractures and right hemothorax s/p right chest tube placement in IR 01/08/2021. Patient awake and alert laying in bed, states that she is still having a lot of pain on her right side, but the pain is less than yesterday.  Right chest tube site c/d/i.   Tube remains off suction, water sealed. Sating 93% on 2L [place].  CXR this AM: 1. Stable appearance of the right chest tube without a pneumothorax. 2. Low lung volumes.  Persistent left basilar chest densities   Allergies: Patient has no known allergies.  Medications: Prior to Admission medications   Medication Sig Start Date End Date Taking? Authorizing Provider  carvedilol (COREG) 3.125 MG tablet Take 1 tablet (3.125 mg total) by mouth 2 (two) times daily with a meal. 10/04/20  Yes [name], MD  diphenhydramine-acetaminophen (TYLENOL PM) 25-500 MG TABS tablet Take 1 tablet by mouth at bedtime as needed (For sleep).   Yes [provider]  furosemide (LASIX) 80 MG tablet Take 1 tablet (80 mg total) by mouth See admin instructions. Take 80mg  by mouth once daily.  Take an additional 80mg  daily as needed for weight gain of 3 pounds in one week or 5 pounds in one day. 10/02/20  Yes Aslam, Loralyn Freshwater, MD  loperamide (IMODIUM) 2 MG capsule Take 1 capsule (2 mg total) by mouth as needed for diarrhea or loose stools. 05/18/20  Yes Andrew Au, MD  potassium chloride (KLOR-CON) 10 MEQ tablet TAKE 1 TABLET(10 MEQ) BY MOUTH DAILY Patient taking differently: Take 10 mEq by mouth daily. 10/04/20  Yes Aslam, Loralyn Freshwater, MD  promethazine (PHENERGAN) 12.5 MG tablet Take 12.5 mg by mouth every 6 (six) hours as needed for nausea or vomiting.   Yes [provider]  vitamin B-12 1000 MCG tablet Take 1 tablet (1,000 mcg  total) by mouth daily. 10/01/20  Yes Seawell, Jaimie A, DO     Vital Signs: BP (!) 142/89 (BP Location: Left Arm)   Pulse 89   Temp 97.6 F (36.4 C) (Axillary)   Resp 20   Ht 5' (1.524 m)   Wt 190 lb 11.2 oz (86.5 kg)   SpO2 97%   BMI 37.24 kg/m   Physical Exam Vitals and nursing note reviewed.  Constitutional:      General: She is not in acute distress. Pulmonary:     Effort: Pulmonary effort is normal. No respiratory distress.     Comments: Sating 93% on 2L [place]. Right chest tube site without tenderness, erythema, drainage, or active bleeding; approximately 650 cc serosanguinous fluid in pleure-vac, water seaeld with (-) air leak. Skin:    General: Skin is warm and dry.  Neurological:     Mental Status: She is alert and oriented to person, place, and time.     Imaging: DG Chest Port 1 View  Result Date: 01/12/2021 CLINICAL DATA:  Evaluate chest tube. EXAM: PORTABLE CHEST 1 VIEW COMPARISON:  01/11/2021 FINDINGS: Again noted is a pigtail tube in the right chest. Right chest tube is stable in position. Negative for pneumothorax. Again noted is elevation of the right hemidiaphragm and enlargement of the cardiac silhouette. Overall low lung volumes. No change in left basilar densities. IMPRESSION: 1. Stable appearance of the right chest tube without a pneumothorax. 2. Low lung volumes.  Persistent left  basilar chest densities. Electronically Signed   By: Markus Daft M.D.   On: 01/12/2021 12:21   DG CHEST PORT 1 VIEW  Result Date: 01/11/2021 CLINICAL DATA:  Chest tube.  Hemothorax. EXAM: PORTABLE CHEST 1 VIEW COMPARISON:  January 10, 2021. FINDINGS: Stable cardiomegaly. Right-sided chest tube is unchanged in position. No pneumothorax is noted. Stable bibasilar subsegmental atelectasis. Probable small left pleural effusion is noted. Bony thorax is unremarkable. IMPRESSION: Stable position of right-sided chest tube. Bibasilar subsegmental atelectasis. Small left pleural effusion.  Electronically Signed   By: Marijo Conception M.D.   On: 01/11/2021 12:31   DG CHEST PORT 1 VIEW  Result Date: 01/10/2021 CLINICAL DATA:  Hemothorax.  Chest tube. EXAM: PORTABLE CHEST 1 VIEW COMPARISON:  04/03/2021. FINDINGS: Right chest tube in stable position. No pneumothorax. Stable cardiomegaly. Low lung volumes with persistent bibasilar atelectasis, slightly improved from prior exam. Persistent left base infiltrate cannot be excluded. Small left pleural effusion again noted. Postsurgical changes thoracolumbar spine again noted. IMPRESSION: 1.  Right chest tube in stable position.  No pneumothorax. 2.  Stable cardiomegaly. 3. Low lung volumes with persistent bibasilar atelectasis, slightly improved from prior exam. Persistent left base infiltrate cannot be excluded. Small left pleural effusion again noted. Electronically Signed   By: Marcello Moores  Register   On: 01/10/2021 08:22   DG CHEST PORT 1 VIEW  Result Date: 01/09/2021 CLINICAL DATA:  Shortness of breath.  Chest tube. EXAM: PORTABLE CHEST 1 VIEW COMPARISON:  01/08/2021. FINDINGS: Right chest tube noted in good anatomic position. Interim resolution of right pleural effusion. No pneumothorax. Persistent bibasilar atelectasis. Left base infiltrate cannot be excluded. Small left pleural effusion noted. Heart size stable. IMPRESSION: 1. Right chest tube noted in good anatomic position. Interim resolution of right pleural effusion. No pneumothorax. 2. Persistent bibasilar atelectasis. Left base infiltrate cannot be excluded. Small left pleural effusion. Electronically Signed   By: Marcello Moores  Register   On: 01/09/2021 07:56   CT IMAGE GUIDED DRAINAGE BY PERCUTANEOUS CATHETER  Result Date: 01/08/2021 INDICATION: 70 year old woman with right hemothorax presents today measure radiology for chest tube placement. EXAM: CT GUIDED DRAINAGE OF RIGHT HEMOTHORAX MEDICATIONS: The patient is currently admitted to the hospital and receiving intravenous antibiotics. The  antibiotics were administered within an appropriate time frame prior to the initiation of the procedure. ANESTHESIA/SEDATION: 3 mg IV Versed 150 mcg IV Fentanyl Moderate Sedation Time:  30 minutes The patient was continuously monitored during the procedure by the interventional radiology nurse under my direct supervision. COMPLICATIONS: None immediate. TECHNIQUE: Informed written consent was obtained from the patient and patient's family after a thorough discussion of the procedural risks, benefits and alternatives. All questions were addressed. Maximal Sterile Barrier Technique was utilized including caps, mask, sterile gowns, sterile gloves, sterile drape, hand hygiene and skin antiseptic. A timeout was performed prior to the initiation of the procedure. PROCEDURE: The right lateral chest wall was prepped with chlorhexidine in a sterile fashion, and a sterile drape was applied covering the operative field. A sterile gown and sterile gloves were used for the procedure. Local anesthesia was provided with 1% Lidocaine. Utilizing CT guidance, 22 gauge spinal needle was advanced to the right pleural surface. The tract was anesthetized with lidocaine. The right pleural space was entered with a 17 gauge introducer needle utilizing CT guidance. The needle was exchanged for a 12 [place] multipurpose pigtail drain over 0.035 inch guidewire. Drain was secured to skin with suture and connected to Pleur-Evac set at -20 cm H2O. IMPRESSION:  12 [place] multipurpose pigtail drain placed in right hemothorax utilizing CT guidance. Electronically Signed   By: Miachel Roux M.D.   On: 01/08/2021 17:26   VAS [place] UPPER EXTREMITY VENOUS DUPLEX  Result Date: 01/10/2021 UPPER VENOUS STUDY  Patient Name:  [name]  Date of Exam:   01/10/2021 Medical Rec #: [id number]         Accession #:    [id number] Date of Birth: Sep 04, 1951         Patient Gender: F Patient Age:   70Y Exam Location:  [facility] Procedure:      VAS [place] UPPER  EXTREMITY VENOUS DUPLEX Referring Phys: [id number] CAROLYN GUILLOUD --------------------------------------------------------------------------------  Indications: Pain in LT arm Limitations: Bandages. Comparison Study: No prior studies. Performing Technologist: Darlin Coco RDMS,RVT  Examination Guidelines: A complete evaluation includes B-mode imaging, spectral Doppler, color Doppler, and power Doppler as needed of all accessible portions of each vessel. Bilateral testing is considered an integral part of a complete examination. Limited examinations for reoccurring indications may be performed as noted.  Right Findings: +----------+------------+---------+-----------+----------+-------+ RIGHT     CompressiblePhasicitySpontaneousPropertiesSummary +----------+------------+---------+-----------+----------+-------+ Subclavian    Full       Yes       Yes                      +----------+------------+---------+-----------+----------+-------+  Left Findings: +----------+------------+---------+-----------+----------+---------------------+ LEFT      CompressiblePhasicitySpontaneousProperties       Summary        +----------+------------+---------+-----------+----------+---------------------+ IJV           Full       Yes       Yes                                    +----------+------------+---------+-----------+----------+---------------------+ Subclavian    Full       Yes       Yes                                    +----------+------------+---------+-----------+----------+---------------------+ Axillary      Full       Yes       Yes                                    +----------+------------+---------+-----------+----------+---------------------+ Brachial      Full                                                        +----------+------------+---------+-----------+----------+---------------------+ Radial        Full                                    Some segments not  well visualized due                                                         to bandaging/IV    +----------+------------+---------+-----------+----------+---------------------+ Ulnar         Full                                    Some segments not                                                        well visualized due                                                         to bandaging/IV    +----------+------------+---------+-----------+----------+---------------------+ Cephalic      Full                                                        +----------+------------+---------+-----------+----------+---------------------+ Basilic       Full                                                        +----------+------------+---------+-----------+----------+---------------------+  Summary:  Right: No evidence of thrombosis in the subclavian.  Left: No evidence of deep vein thrombosis in the upper extremity. No evidence of superficial vein thrombosis in the upper extremity.  *See table(s) above for measurements and observations.  Diagnosing physician: Jamelle Haring Electronically signed by Jamelle Haring on 01/10/2021 at 3:43:48 PM.    Final     Labs:  CBC: Recent Labs    01/09/21 1626 01/10/21 0139 01/11/21 0203 01/12/21 0021  WBC 22.3* 17.1* 13.2* 12.6*  HGB 7.6* 7.9* 7.9* 7.9*  HCT 24.4* 25.5* 26.0* 24.7*  PLT 126* 108* 106* 86*    COAGS: Recent Labs    04/19/20 0341 05/13/20 2149 06/29/20 1427 09/26/20 1337 01/08/21 0650  INR  --  1.1 1.0 1.1 1.2  APTT 31 24 30  33  --     BMP: Recent Labs    05/14/20 0614 05/15/20 0058 05/16/20 0110 05/17/20 0631 06/29/20 1427 01/10/21 0139 01/11/21 0203 01/12/21 0021 01/12/21 0717  NA 135 138 140 142   < > 137 138 134* 138  K 3.9 3.7 3.8 3.9   < > 3.4* 4.3 5.5* 4.6  CL 92* 96* 97* 99   < > 96* 97* 97* 101  CO2 34* 33* 35* 35*   < > 33*  36* 28 31  GLUCOSE 96 136* 95 91   < > 90 114* 129* 106*  BUN 35* 21 9 7*   < > 31* 16 12 11   CALCIUM 8.5* 8.7* 9.1 9.3   < > 8.3* 8.8* 8.2* 8.4*  CREATININE 1.87* 0.85 0.55 0.56   < > 0.52 0.49 0.52 0.44  GFRNONAA 27* >60 >60 >60   < > >60 >60 >60 >60  GFRAA 32* >60 >60 >60  --   --   --   --   --    < > = values in this interval not displayed.    LIVER FUNCTION TESTS: Recent Labs    07/08/20 0336 09/26/20 1337 09/27/20 0404 01/04/21 0049  BILITOT 1.8* 0.9 1.0 0.8  AST 18 24 17 23   ALT 21 22 19  36  ALKPHOS 60 114 91 90  PROT 5.5* 6.4* 5.7* 6.2*  ALBUMIN 3.4* 3.0* 2.5* 3.5    Assessment and Plan:  History of mechanical fall with subsequent right rib fractures and right hemothorax s/p right chest tube placement in IR 01/08/2021. Right chest tube stable with approximately 650 cc serosanguinous fluid in pleure-vac, water sealed (-) air leak.  Discussed case with Dr. Dwaine Gale who recommends continue water seal trial due to high OP (240cc over 24 hours) and possible removal tomorrow pending AM CXR (ordered). Further plans per IMTS- appreciate and agree with management. IR to follow.   Electronically Signed: Tera Mater, PA-C 01/12/2021, 1:52 PM   I spent a total of 15 Minutes at the the patient's bedside AND on the patient's hospital floor or unit, greater than 50% of which was counseling/coordinating care for right hemopneumothorax s/p right chest tube placement.

## 2021-01-12 NOTE — Progress Notes (Addendum)
Subjective:   Patient has no new complaints this morning. Still has some pain over the broken ribs and chest tube. Sleeping well at night with BiPAP. Pain is better controlled with current regimen. No new complaints.  Objective:  Vital signs in last 24 hours: Vitals:   01/11/21 2254 01/11/21 2255 01/11/21 2309 01/12/21 0305  BP:   (!) 125/55 (!) 124/54  Pulse: 91 91 75 72  Resp: (!) 27 (!) 27 19 20   Temp:   97.6 F (36.4 C) (!) 97.4 F (36.3 C)  TempSrc:   Axillary Axillary  SpO2: 100% 100% 100% 100%  Weight:      Height:       Physical Exam Vitals and nursing note reviewed.  Constitutional: appears comfortable Head: atraumatic Cardiovascular: regular rate and rhythm, normal heart sounds Pulmonary: on nasal canula 2L, ascultation limited by habitus and positioning but clear bilaterally Skin: warm and dry, healing abrasion on chin reportedly from cervical collar, contusion also noted on forehead from BiPAP mask, lower extremities with chronic venous stasis changes, trace pitting edema, pending Unna boot changes later on today Neurological: alert, oriented   Assessment/Plan:  [name] is a 70 y/o F with hx of chronic respiratory failure on 3-4L chronically, morbid obesity, HFpEF, chronic ITP, chronic leg wounds who presents with rib fractures after mechanical fall. Acute on chronic hypercapneic and hypoxic respiratory failure, also found to have hemothorax. Respiratory status now much improved after placing chest tube, she is maintaining sats on less than her home oxygen.  Principal Problem:   Pneumothorax Active Problems:   Pressure injury of skin   Multiple closed fractures of ribs of right side   Fall against object   Acute respiratory distress   Rib fractures   Hemothorax on right   Pleural effusion   Lesion of bone of thoracic spine   Chest tube in place  Acute on chronic hypercapneic and hypoxic respiratory failure, on 3-4L chronically Ribs fracture due to  mechanical fall Traumatic hemothorax in the setting of chronic ITP Symptomatic anemia Rib fractures and hemothorax after mechanical fall. Clinical course has been complicated by worsening hypercarbia and development of hemothorax, initially drained by thoracentesis but reaccumulated so pigtail catheter placed. Output is slowing down, and now more serous. S/p 1 unit pRBCs. Hgb stabilized.  Saturating well on 2 L.  - PCCM signed off - Appreciate IR following chest tube, plan for waterseal trial and possible removal - nasal canula to maintain sats 88-92% - BiPAP at night - daily chest x-ray - daily CBC - Multimodal pain control with lidoderm patch, IV toradol, IV tylenol, and fentanyl - Pulm toilet, incentive spirometer when off BiPAP - cefdinir, day 5 out of 5 - Lower extremity doppler and left upper extremity Doppler without evidence of DVT   T12 destructive change suspicious for tumor MRI with change at T12 concerning for malignant or infectious etiology.  No clear primary at this point. Has never had a mammogram, breast exam without overt abnormality. 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 Patient was taken down to IR for biopsy, but she refused it saying that she has gone through too much and can't do right now.  Spoke with neurosurgery last week who said this was not necessarily an urgent procedure and can be done outpatient.  We will revisit this with her next week, but may defer this to the outpatient setting.  - Neurosurgery consulted -  IR consulted for biopsy, appreciate assistance, unfortunately patient cannot complete biopsy - outpatient mammogram, colonoscopy   Chronic HFpEF OSA - Continue home medications of Coreg, and Klor-Con - Hold off Lasix for several days as she was volume down.  She appears more volume repeat at this time, we starts 40 mg p.o. Lasix tomorrow which is half her home dose - O2 as above -  BiPAP at night   Chronic Venous Stasis -- Unna wrappings   Chronic ITP - Daily CBC - Hold off on VTE prophylaxis with this and her hemothorax  VTE prophylaxis: holding off with her thrombocytopenia and hemothoraax Prior to Admission Living Arrangement: home Anticipated Discharge Location: likely home, she has vehemently declined SNF Barriers to Discharge: medical workup, pain control  Dispo: anticipate discharge in 2-3 days  Dr. Edison Simon Internal Medicine PGY-1  Pager: [phone] After 5pm on weekdays and 1pm on weekends: On Call pager [phone]  01/12/2021, 6:30 AM

## 2021-01-13 ENCOUNTER — Inpatient Hospital Stay (HOSPITAL_COMMUNITY): Payer: Medicare PPO

## 2021-01-13 DIAGNOSIS — S270XXA Traumatic pneumothorax, initial encounter: Secondary | ICD-10-CM | POA: Diagnosis not present

## 2021-01-13 DIAGNOSIS — D649 Anemia, unspecified: Secondary | ICD-10-CM

## 2021-01-13 DIAGNOSIS — G4733 Obstructive sleep apnea (adult) (pediatric): Secondary | ICD-10-CM | POA: Diagnosis not present

## 2021-01-13 LAB — BASIC METABOLIC PANEL
Anion gap: 7 (ref 5–15)
BUN: 10 mg/dL (ref 8–23)
CO2: 34 mmol/L — ABNORMAL HIGH (ref 22–32)
Calcium: 8.7 mg/dL — ABNORMAL LOW (ref 8.9–10.3)
Chloride: 97 mmol/L — ABNORMAL LOW (ref 98–111)
Creatinine, Ser: 0.54 mg/dL (ref 0.44–1.00)
GFR, Estimated: >60 mL/min (ref >60)
Glucose, Bld: 109 mg/dL — ABNORMAL HIGH (ref 70–99)
Potassium: 4.9 mmol/L (ref 3.5–5.1)
Sodium: 138 mmol/L (ref 135–145)

## 2021-01-13 LAB — CBC
HCT: 29.7 % — ABNORMAL LOW (ref 36.0–46.0)
Hemoglobin: 8.8 g/dL — ABNORMAL LOW (ref 12.0–15.0)
MCH: 28.7 pg (ref 26.0–34.0)
MCHC: 29.6 g/dL — ABNORMAL LOW (ref 30.0–36.0)
MCV: 96.7 fL (ref 80.0–100.0)
Platelets: DECREASED 10*3/uL (ref 150–400)
RBC: 3.07 MIL/uL — ABNORMAL LOW (ref 3.87–5.11)
RDW: 18.4 % — ABNORMAL HIGH (ref 11.5–15.5)
WBC: 12 10*3/uL — ABNORMAL HIGH (ref 4.0–10.5)
nRBC: 0 % (ref 0.0–0.2)

## 2021-01-13 MED ORDER — BACLOFEN 5 MG HALF TABLET
5.0000 mg | ORAL_TABLET | Freq: Three times a day (TID) | ORAL | Status: DC | PRN
  Administered 2021-01-13 – 2021-01-21 (×23): 5 mg via ORAL
  Filled 2021-01-13 (×25): qty 1

## 2021-01-13 MED ORDER — BACLOFEN 10 MG PO TABS
10.0000 mg | ORAL_TABLET | Freq: Once | ORAL | Status: DC
  Filled 2021-01-13: qty 1

## 2021-01-13 NOTE — Progress Notes (Signed)
Subjective:   Patient has no new complaints this morning. Still has some pain and spasm over the broken ribs and chest tube. Denies chest pain, SOB. Sleeping well at night with BiPAP. Pain is better controlled with current regimen. States she needs a muscle relaxant. No new complaints.  Objective:  Vital signs in last 24 hours: Vitals:   01/12/21 2315 01/13/21 0307 01/13/21 0719 01/13/21 1110  BP: 123/77 (!) 146/65 135/63 127/66  Pulse: 100 80  96  Resp: 20 20  20   Temp: 98 F (36.7 C) 97.6 F (36.4 C) 98.4 F (36.9 C) 99.1 F (37.3 C)  TempSrc: Oral Axillary Oral Oral  SpO2: 98% 100%  96%  Weight:      Height:       Physical Exam Vitals and nursing note reviewed.  Constitutional: appears comfortable Head: atraumatic Cardiovascular: regular rate and rhythm, normal heart sounds Pulmonary: on nasal canula 2L, ascultation limited by habitus and positioning but clear bilaterally Skin: warm and dry, healing abrasion on chin reportedly from cervical collar, contusion also noted on forehead from BiPAP mask, lower extremities with Unna wraps. Neurological: alert, oriented   Assessment/Plan:  [name] is a 70 y/o F with hx of chronic respiratory failure on 3-4L chronically, morbid obesity, HFpEF, chronic ITP, chronic leg wounds who presents with rib fractures after mechanical fall. Acute on chronic hypercapneic and hypoxic respiratory failure, also found to have hemothorax. Respiratory status now much improved after placing chest tube, she is maintaining sats on less than her home oxygen.  Principal Problem:   Pneumothorax Active Problems:   Pressure injury of skin   Multiple closed fractures of ribs of right side   Fall against object   Acute respiratory distress   Rib fractures   Hemothorax on right   Pleural effusion   Lesion of bone of thoracic spine   Chest tube in place  Acute on chronic hypercapneic and hypoxic respiratory failure, on 3-4L chronically Ribs  fracture due to mechanical fall Traumatic hemothorax in the setting of chronic ITP Symptomatic anemia Rib fractures and hemothorax after mechanical fall. Clinical course has been complicated by worsening hypercarbia and development of hemothorax, initially drained by thoracentesis but reaccumulated so pigtail catheter placed. Output is slowing down, and now more serous. S/p 1 unit pRBCs. Hgb stabilized.  Saturating well on 2 L. Completed 5 days course of Cefdinir. Lower extremity Doppler without evidence of DVT. CXR showed increased subsegmental atelectasis in the right base. - PCCM signed off - Appreciate IR following chest tube, plan for waterseal trial and possible removal - nasal canula to maintain sats 88-92% - BiPAP at night - daily chest x-ray - daily CBC - Multimodal pain control with lidoderm patch, IV toradol, IV tylenol, and fentanyl --Start Baclofen 5 mg TID PRN for muscle spasms. - Pulm toilet, incentive spirometer when off BIPAP   T12 destructive change suspicious for tumor MRI with change at T12 concerning for malignant or infectious etiology.  No clear primary at this point. Has never had a mammogram, breast exam without overt abnormality. 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  As Per neurosurgery, it can be done outpatient.  We will revisit this with her next week, but may defer this to the outpatient setting. - Neurosurgery consulted - IR consulted for biopsy, appreciate assistance, unfortunately patient cannot complete biopsy - outpatient mammogram, colonoscopy  Chronic HFpEF OSA - Continue home medications of Coreg, and Klor-Con - Continue lasix started 40 mg - O2 as above - BiPAP at night   Chronic Venous Stasis -- Unna wrappings   Chronic ITP - Daily CBC - Hold off on VTE prophylaxis with this and her hemothorax  SNF vs Home  -Due to Pt's multiple  comorbidities, OT and PT recommended Pt to go to SNF but she refused and wants to go Home with [facility] services. -Tried to call daughter [name] to confirm- No answer.- Will F/u tomorrow.   VTE prophylaxis: holding off with her thrombocytopenia and hemothoraax Prior to Admission Living Arrangement: home Anticipated Discharge Location: likely home, she has vehemently declined SNF Barriers to Discharge: medical workup, pain control  Dispo: anticipate discharge in 2-3 days  [place] [facility] PGY-1  On Call Pager: [phone]   01/13/2021, 12:30 PM

## 2021-01-13 NOTE — Plan of Care (Signed)

## 2021-01-13 NOTE — Progress Notes (Signed)
Referring Physician(s): Velna Ochs (IMTS)  Supervising Physician: Dr. Anselm Pancoast  Patient Status:  [facility] - In-pt  Chief Complaint: right chest tube F/U  Subjective:  History of mechanical fall with subsequent right rib fractures and right hemothorax s/p right chest tube placement in IR 01/08/2021. Patient awake and alert laying in bed, states 'hurts all over' Right chest tube site c/d/i.   Tube remains off suction, water sealed. Was put on Bipap briefly last pm. Sats 98-100% on 2L [place] this am.  CXR this AM: 1. The right chest tube remains in place without pneumothorax. 2. Increased subsegmental atelectasis in the right base. 3. Opacity in the left retrocardiac region may represent atelectasis.   Allergies: Patient has no known allergies.  Medications:  Current Facility-Administered Medications:  .  0.9 %  sodium chloride infusion, 250 mL, Intravenous, PRN, Stark Klein, MD .  acetaminophen (TYLENOL) tablet 1,000 mg, 1,000 mg, Oral, Q8H, Jose Persia, MD, 1,000 mg at 01/12/21 2113 .  fentaNYL (SUBLIMAZE) injection 12.5 mcg, 12.5 mcg, Intravenous, Q4H PRN, Jose Persia, MD, 12.5 mcg at 01/13/21 0932 .  furosemide (LASIX) tablet 40 mg, 40 mg, Oral, Daily, Andrew Au, MD, 40 mg at 01/13/21 0835 .  ipratropium-albuterol (DUONEB) 0.5-2.5 (3) MG/3ML nebulizer solution 3 mL, 3 mL, Nebulization, Q6H PRN, Norm Parcel, PA-C, 3 mL at 01/04/21 1032 .  lidocaine (LIDODERM) 5 % 1 patch, 1 patch, Transdermal, Q24H, Norm Parcel, PA-C, 1 patch at 01/13/21 4742 .  ondansetron (ZOFRAN-ODT) disintegrating tablet 4 mg, 4 mg, Oral, Q6H PRN **OR** ondansetron (ZOFRAN) injection 4 mg, 4 mg, Intravenous, Q6H PRN, Stark Klein, MD, 4 mg at 01/04/21 1020 .  sodium chloride flush (NS) 0.9 % injection 3 mL, 3 mL, Intravenous, Q12H, Stark Klein, MD, 3 mL at 01/13/21 0835 .  sodium chloride flush (NS) 0.9 % injection 3 mL, 3 mL, Intravenous, PRN, Stark Klein, MD    Vital  Signs: BP 135/63 (BP Location: Right Arm)   Pulse 80   Temp 98.4 F (36.9 C) (Oral)   Resp 20   Ht 5' (1.524 m)   Wt 86.5 kg   SpO2 100%   BMI 37.24 kg/m   Physical Exam Vitals and nursing note reviewed.  Constitutional:      General: She is not in acute distress. Pulmonary:     Effort: Pulmonary effort is normal. No respiratory distress.     Comments: Right chest tube site without tenderness, erythema, drainage, or active bleeding Output down to only 50 mL past 24 hrs. Skin:    General: Skin is warm and dry.  Neurological:     Mental Status: She is alert and oriented to person, place, and time.     Imaging: DG Chest Port 1 View  Result Date: 01/13/2021 CLINICAL DATA:  Hemothorax on the right. EXAM: PORTABLE CHEST 1 VIEW COMPARISON:  January 12, 2021 FINDINGS: The right chest tube remains in stable position without visualized pneumothorax. Platelike opacity in the right base is consistent with atelectasis. Opacity in left retrocardiac region is probably atelectasis. The cardiomediastinal silhouette is stable. No other changes. IMPRESSION: 1. The right chest tube remains in place without pneumothorax. 2. Increased subsegmental atelectasis in the right base. 3. Opacity in the left retrocardiac region may represent atelectasis. Electronically Signed   By: Dorise Bullion III M.D   On: 01/13/2021 08:31   DG Chest Port 1 View  Result Date: 01/12/2021 CLINICAL DATA:  Evaluate chest tube. EXAM: PORTABLE CHEST 1 VIEW COMPARISON:  01/11/2021 FINDINGS: Again noted is a pigtail tube in the right chest. Right chest tube is stable in position. Negative for pneumothorax. Again noted is elevation of the right hemidiaphragm and enlargement of the cardiac silhouette. Overall low lung volumes. No change in left basilar densities. IMPRESSION: 1. Stable appearance of the right chest tube without a pneumothorax. 2. Low lung volumes.  Persistent left basilar chest densities. Electronically Signed   By: Markus Daft M.D.   On: 01/12/2021 12:21   DG CHEST PORT 1 VIEW  Result Date: 01/11/2021 CLINICAL DATA:  Chest tube.  Hemothorax. EXAM: PORTABLE CHEST 1 VIEW COMPARISON:  January 10, 2021. FINDINGS: Stable cardiomegaly. Right-sided chest tube is unchanged in position. No pneumothorax is noted. Stable bibasilar subsegmental atelectasis. Probable small left pleural effusion is noted. Bony thorax is unremarkable. IMPRESSION: Stable position of right-sided chest tube. Bibasilar subsegmental atelectasis. Small left pleural effusion. Electronically Signed   By: Marijo Conception M.D.   On: 01/11/2021 12:31   DG CHEST PORT 1 VIEW  Result Date: 01/10/2021 CLINICAL DATA:  Hemothorax.  Chest tube. EXAM: PORTABLE CHEST 1 VIEW COMPARISON:  04/03/2021. FINDINGS: Right chest tube in stable position. No pneumothorax. Stable cardiomegaly. Low lung volumes with persistent bibasilar atelectasis, slightly improved from prior exam. Persistent left base infiltrate cannot be excluded. Small left pleural effusion again noted. Postsurgical changes thoracolumbar spine again noted. IMPRESSION: 1.  Right chest tube in stable position.  No pneumothorax. 2.  Stable cardiomegaly. 3. Low lung volumes with persistent bibasilar atelectasis, slightly improved from prior exam. Persistent left base infiltrate cannot be excluded. Small left pleural effusion again noted. Electronically Signed   By: Marcello Moores  Register   On: 01/10/2021 08:22   VAS [place] UPPER EXTREMITY VENOUS DUPLEX  Result Date: 01/10/2021 UPPER VENOUS STUDY  Patient Name:  MARABELLA FOO  Date of Exam:   01/10/2021 Medical Rec #: [id number]         Accession #:    [id number] Date of Birth: 08/04/1951         Patient Gender: F Patient Age:   70Y Exam Location:  [facility] Procedure:      VAS [place] UPPER EXTREMITY VENOUS DUPLEX Referring Phys: [id number] CAROLYN GUILLOUD --------------------------------------------------------------------------------  Indications: Pain in LT arm  Limitations: Bandages. Comparison Study: No prior studies. Performing Technologist: Darlin Coco RDMS,RVT  Examination Guidelines: A complete evaluation includes B-mode imaging, spectral Doppler, color Doppler, and power Doppler as needed of all accessible portions of each vessel. Bilateral testing is considered an integral part of a complete examination. Limited examinations for reoccurring indications may be performed as noted.  Right Findings: +----------+------------+---------+-----------+----------+-------+ RIGHT     CompressiblePhasicitySpontaneousPropertiesSummary +----------+------------+---------+-----------+----------+-------+ Subclavian    Full       Yes       Yes                      +----------+------------+---------+-----------+----------+-------+  Left Findings: +----------+------------+---------+-----------+----------+---------------------+ LEFT      CompressiblePhasicitySpontaneousProperties       Summary        +----------+------------+---------+-----------+----------+---------------------+ IJV           Full       Yes       Yes                                    +----------+------------+---------+-----------+----------+---------------------+ Subclavian    Full       Yes  Yes                                    +----------+------------+---------+-----------+----------+---------------------+ Axillary      Full       Yes       Yes                                    +----------+------------+---------+-----------+----------+---------------------+ Brachial      Full                                                        +----------+------------+---------+-----------+----------+---------------------+ Radial        Full                                    Some segments not                                                        well visualized due                                                         to bandaging/IV     +----------+------------+---------+-----------+----------+---------------------+ Ulnar         Full                                    Some segments not                                                        well visualized due                                                         to bandaging/IV    +----------+------------+---------+-----------+----------+---------------------+ Cephalic      Full                                                        +----------+------------+---------+-----------+----------+---------------------+ Basilic       Full                                                        +----------+------------+---------+-----------+----------+---------------------+  Summary:  Right: No evidence of thrombosis in the subclavian.  Left: No evidence of deep vein thrombosis in the upper extremity. No evidence of superficial vein thrombosis in the upper extremity.  *See table(s) above for measurements and observations.  Diagnosing physician: Jamelle Haring Electronically signed by Jamelle Haring on 01/10/2021 at 3:43:48 PM.    Final     Labs:  CBC: Recent Labs    01/10/21 0139 01/11/21 0203 01/12/21 0021 01/13/21 0205  WBC 17.1* 13.2* 12.6* 12.0*  HGB 7.9* 7.9* 7.9* 8.8*  HCT 25.5* 26.0* 24.7* 29.7*  PLT 108* 106* 86* PLATELET CLUMPS NOTED ON SMEAR, COUNT APPEARS DECREASED    COAGS: Recent Labs    04/19/20 0341 05/13/20 2149 06/29/20 1427 09/26/20 1337 01/08/21 0650  INR  --  1.1 1.0 1.1 1.2  APTT 31 24 30  33  --     BMP: Recent Labs    05/14/20 0614 05/15/20 0058 05/16/20 0110 05/17/20 0631 06/29/20 1427 01/11/21 0203 01/12/21 0021 01/12/21 0717 01/13/21 0205  NA 135 138 140 142   < > 138 134* 138 138  K 3.9 3.7 3.8 3.9   < > 4.3 5.5* 4.6 4.9  CL 92* 96* 97* 99   < > 97* 97* 101 97*  CO2 34* 33* 35* 35*   < > 36* 28 31 34*  GLUCOSE 96 136* 95 91   < > 114* 129* 106* 109*  BUN 35* 21 9 7*   < > 16 12 11 10   CALCIUM 8.5*  8.7* 9.1 9.3   < > 8.8* 8.2* 8.4* 8.7*  CREATININE 1.87* 0.85 0.55 0.56   < > 0.49 0.52 0.44 0.54  GFRNONAA 27* >60 >60 >60   < > >60 >60 >60 >60  GFRAA 32* >60 >60 >60  --   --   --   --   --    < > = values in this interval not displayed.    LIVER FUNCTION TESTS: Recent Labs    07/08/20 0336 09/26/20 1337 09/27/20 0404 01/04/21 0049  BILITOT 1.8* 0.9 1.0 0.8  AST 18 24 17 23   ALT 21 22 19  36  ALKPHOS 60 114 91 90  PROT 5.5* 6.4* 5.7* 6.2*  ALBUMIN 3.4* 3.0* 2.5* 3.5    Assessment and Plan:  History of mechanical fall with subsequent right rib fractures and right hemothorax s/p right chest tube placement in IR 01/08/2021. Right chest tube stable with approximately 650 cc serosanguinous fluid in pleure-vac, water sealed (-) air leak. Output trending down and CXR stable, minimal effusion. Atelectasis noted, encouraged IS use. If output remains minimal another 24 hrs, feel comfortable removing drain tomorrow.    Electronically Signed: Ascencion Dike, PA-C 01/13/2021, 10:20 AM   I spent a total of 15 Minutes at the the patient's bedside AND on the patient's hospital floor or unit, greater than 50% of which was counseling/coordinating care for right hemopneumothorax s/p right chest tube placement.

## 2021-01-14 ENCOUNTER — Inpatient Hospital Stay (HOSPITAL_COMMUNITY): Payer: Medicare PPO

## 2021-01-14 DIAGNOSIS — S2239XA Fracture of one rib, unspecified side, initial encounter for closed fracture: Secondary | ICD-10-CM

## 2021-01-14 LAB — CBC
HCT: 28.4 % — ABNORMAL LOW (ref 36.0–46.0)
Hemoglobin: 8.4 g/dL — ABNORMAL LOW (ref 12.0–15.0)
MCH: 28.5 pg (ref 26.0–34.0)
MCHC: 29.6 g/dL — ABNORMAL LOW (ref 30.0–36.0)
MCV: 96.3 fL (ref 80.0–100.0)
Platelets: 124 10*3/uL — ABNORMAL LOW (ref 150–400)
RBC: 2.95 MIL/uL — ABNORMAL LOW (ref 3.87–5.11)
RDW: 17.8 % — ABNORMAL HIGH (ref 11.5–15.5)
WBC: 12.6 10*3/uL — ABNORMAL HIGH (ref 4.0–10.5)
nRBC: 0 % (ref 0.0–0.2)

## 2021-01-14 LAB — BASIC METABOLIC PANEL
Anion gap: 4 — ABNORMAL LOW (ref 5–15)
BUN: 11 mg/dL (ref 8–23)
CO2: 38 mmol/L — ABNORMAL HIGH (ref 22–32)
Calcium: 8.7 mg/dL — ABNORMAL LOW (ref 8.9–10.3)
Chloride: 97 mmol/L — ABNORMAL LOW (ref 98–111)
Creatinine, Ser: 0.5 mg/dL (ref 0.44–1.00)
GFR, Estimated: >60 mL/min (ref >60)
Glucose, Bld: 102 mg/dL — ABNORMAL HIGH (ref 70–99)
Potassium: 4.3 mmol/L (ref 3.5–5.1)
Sodium: 139 mmol/L (ref 135–145)

## 2021-01-14 LAB — MAGNESIUM: Magnesium: 2 mg/dL (ref 1.7–2.4)

## 2021-01-14 NOTE — Progress Notes (Signed)
Orthopedic Tech Progress Note Patient Details:  JEANNA GIUFFRE 1951/01/27 [id number]  Ortho Devices Type of Ortho Device: Haematologist Ortho Device/Splint Location: bil unna boots Ortho Device/Splint Interventions: Application   Post Interventions Patient Tolerated: Well Instructions Provided: Care of device,Poper ambulation with device  Bil Unna boots applied.  RN in room to check skin/wounds with ortho tech.    Thanks,  Verdene Lennert, PT, DPT  Acute Rehabilitation [phone] pager #[phone] office      Barbarann Ehlers Khloi Rawl 01/14/2021, 4:27 PM

## 2021-01-14 NOTE — TOC Progression Note (Signed)
Transition of Care (TOC) - Progression Note  Valentina Gu, BSN Transitions of Care Unit 4E- RN Case Manager See Treatment Team for direct phone #    Patient Details  Name: [name] MRN: [id number] Date of Birth: 11-22-1950  Transition of Care [facility]) CM/SW Contact  Evelyn Client Romeo Rabon, RN Phone Number: 01/14/2021, 12:54 PM  Clinical Narrative:    Follow up done for pt's transition needs. Spoke with [name] at [employer] this am- form has been placed on shadow chart that needs signature for NIV needs. Msg sent to rounding team and form has been signed. [employer] to f/u and continue to follow for NIV insurance approval for home NIV.   Call made back to [facility]) for primary care needs with visiting doctor- spoke with Claiborne Billings- per [facility] conversation they do not currently have a provider for this area but are actively looking. Pt could do tele health visits with a provider until [place] finds a visiting provider for this area. Pt would need to fill out Registration paperwork either online or they can email or mail her the paperwork needed. Once reviewed the can typically schedule a visit within 48hr., [facility] would bill insurance first then pt would be billed any portion that she is responsible for   Spoke with pt regarding the visiting doctor options- pt is agreeable to the tele health visits, she has a laptop computer but not a smart phone. She said her daughter may have to help her, but that should be alright. Pt is agreeable to doing the online registration with this Probation officer- will plan to return tomorrow afternoon to assist pt with the online registration for the [facility]. Pt understands that the tele health visits are until they find a provider to do in home visits.    Expected Discharge Plan: [place] Barriers to Discharge: Continued Medical Work up  Expected Discharge Plan and Services Expected Discharge Plan: [place]    Discharge Planning Services: CM Consult Post Acute Care Choice: Home Health,Resumption of Svcs/PTA Provider Living arrangements for the past 2 months: Single Family Home                 DME Arranged: NIV DME Agency: [facility] Date DME Agency Contacted: 01/11/21 Time DME Agency Contacted: [phone]   [place]: [place] Agency: [facility] [employer]         Social Determinants of Health ([place]) Interventions    Readmission Risk Interventions Readmission Risk Prevention Plan 09/30/2020  Transportation Screening Complete  Medication Review Press photographer) Complete  PCP or Specialist appointment within 3-5 days of discharge Complete  HRI or [place] Complete  SW Recovery Care/Counseling Consult Complete  [place] Patient Refused  Some recent data might be hidden

## 2021-01-14 NOTE — Progress Notes (Deleted)
Internal Medicine Attending:   I saw and examined the patient. I reviewed the Dr Jeoffrey Massed note and I agree with the resident's findings and plan as documented in the resident's note.  Overall chest tube removed today,  Still having muscle spasms which I anticipate will improve after removal.  She is adamant she will not go to SNF due to previous bad experience.  Anticipate discharge home in 1-2 days.

## 2021-01-14 NOTE — Progress Notes (Signed)
Physical Therapy Treatment Patient Details Name: [name] MRN: [id number] DOB: [birth year] Today's Date: 01/14/2021    History of Present Illness Pt adm 4/20 with rib fractures after mechanical fall. Acute on chronic hypercapneic and hypoxic respiratory failure, also found to have hemothorax. Had thoracentesis with removal of 600cc of frank blood on 4/23 & chest tube placed 4/26.  Chest tube removed 5/2. PMH - chronic respiratory failure on 3-4L chronically, morbid obesity, HFpEF, chronic ITP, chronic leg wounds    PT Comments    Pt making very slow progress. Has been limited by pain and hasn't made it past the edge of the bed. Pt adamantly refusing SNF so will need maximum support at home. May need hoyer lift to get up at home if she doesn't improve. Will need ambulance transport home.    Follow Up Recommendations  Supervision/Assistance - 24 hour;Home health PT (Pt adamantly refuses SNF)     Equipment Recommendations  Other (comment) (hoyer lift)    Recommendations for Other Services       Precautions / Restrictions Precautions Precautions: Fall Precaution Comments: rib fxs, flexiseal, O2 dependent    Mobility  Bed Mobility Overal bed mobility: Needs Assistance Bed Mobility: Supine to Sit;Sit to Supine;Rolling Rolling: Max assist   Supine to sit: +2 for physical assistance;Max assist;HOB elevated Sit to supine: +2 for physical assistance;Max assist   General bed mobility comments: Assist to bring legs off of bed, elevate trunk into sitting, and bring hips to EOB. Assist to lower trunk and bring legs back into the bed    Transfers                 General transfer comment: Attempted to have pt stand but pt did not attempt due to pain.  Ambulation/Gait                 Stairs             Wheelchair Mobility    Modified Rankin (Stroke Patients Only)       Balance Overall balance assessment: Needs assistance Sitting-balance support: Feet  supported;Single extremity supported Sitting balance-Leahy Scale: Poor Sitting balance - Comments: UE support. Sat EOB x 10 min                                    Cognition Arousal/Alertness: Awake/alert Behavior During Therapy: Anxious Overall Cognitive Status: No family/caregiver present to determine baseline cognitive functioning                                 General Comments: Follows 1 step commands. Pain and anxiety impede cognitive function      Exercises      General Comments        Pertinent Vitals/Pain Pain Assessment: Faces Faces Pain Scale: Hurts worst Pain Location: yelling with transitional movements. R flank, back pain sitting EOB. Pain Descriptors / Indicators: Grimacing;Guarding;Moaning    Home Living                      Prior Function            PT Goals (current goals can now be found in the care plan section) Acute Rehab PT Goals Patient Stated Goal: to go home Progress towards PT goals: Not progressing toward goals - comment    Frequency  Min 3X/week      PT Plan Discharge plan needs to be updated    Co-evaluation              AM-PAC PT "6 Clicks" Mobility   Outcome Measure  Help needed turning from your back to your side while in a flat bed without using bedrails?: Total Help needed moving from lying on your back to sitting on the side of a flat bed without using bedrails?: Total Help needed moving to and from a bed to a chair (including a wheelchair)?: Total Help needed standing up from a chair using your arms (e.g., wheelchair or bedside chair)?: Total Help needed to walk in hospital room?: Total Help needed climbing 3-5 steps with a railing? : Total 6 Click Score: 6    End of Session Equipment Utilized During Treatment: Oxygen Activity Tolerance: Patient limited by pain Patient left: in bed;with call bell/phone within reach;with bed alarm set   PT Visit Diagnosis: Muscle weakness  (generalized) (M62.81);Difficulty in walking, not elsewhere classified (R26.2);Pain;History of falling (Z91.81) Pain - Right/Left: Right Pain - part of body:  (ribs and back)     Time: 1203-1224 PT Time Calculation (min) (ACUTE ONLY): 21 min  Charges:  $Therapeutic Activity: 8-22 mins                     [place] Pager [phone] Office [place] 01/14/2021, 2:19 PM

## 2021-01-14 NOTE — Progress Notes (Addendum)
Subjective: No overnight event. Pt seen and examined today at the bedside.  Patient this morning would like her Bipap off so she can eat. Expresses frustration that no one has come to help after about half an hour of waiting. Removed mask, placed on 2 L [place], and set up tray for her. Notes she is having muscle spasm all over her body since she has not had her medications. Feels current medications help a little but feels she need something stronger.  States her breathing is doing well. Still having pain all day long, pain worse with moving and causing worsening her muscle spasms. Patient adamantly refused SNF with rehab. States she will never go back to another one after having a poor experience prior to this. Would like to have PT at home.   Objective:  Vital signs in last 24 hours: Vitals:   01/13/21 2259 01/14/21 0312 01/14/21 0916 01/14/21 1139  BP: (!) 130/53 (!) 130/58 131/62 127/69  Pulse: 91 85 88 100  Resp: 18 (!) 24 20 20   Temp: 98.6 F (37 C) (!) 97.5 F (36.4 C) 98.6 F (37 C) 98.6 F (37 C)  TempSrc: Oral Axillary Oral Oral  SpO2: 100% 100% 98% 97%  Weight:      Height:        Physical Exam Vitals and nursing note reviewed.  Constitutional: appears comfortable Head: atraumatic Cardiovascular: regular rate and rhythm, normal heart sounds Pulmonary: on nasal canula 2L, ascultation limited by habitus and positioning but clear bilaterally Skin: warm and dry, healing abrasion on chin reportedly from cervical collar, lower extremities with Unna wraps. Neurological: alert, oriented   Assessment/Plan:  [name] is a 70 y/o F with hx of chronic respiratory failure on 3-4L chronically, morbid obesity, HFpEF, chronic ITP, chronic leg wounds who presents with rib fractures after mechanical fall. Acute on chronic hypercapneic and hypoxic respiratory failure, also found to have hemothorax. Respiratory status now much improved after placing chest tube, she is maintaining sats on  less than her home oxygen.  Principal Problem:   Pneumothorax Active Problems:   Pressure injury of skin   Multiple closed fractures of ribs of right side   Fall against object   Acute respiratory distress   Rib fractures   Hemothorax on right   Pleural effusion   Lesion of bone of thoracic spine   Chest tube in place  Acute on chronic hypercapneic and hypoxic respiratory failure, on 3-4L chronically Ribs fracture due to mechanical fall Traumatic hemothorax in the setting of chronic ITP Symptomatic anemia Rib fractures and hemothorax after mechanical fall. Clinical course has been complicated by worsening hypercarbia and development of hemothorax, initially drained by thoracentesis but reaccumulated so pigtail catheter placed. Output is slowing down, and now more serous. S/p 1 unit pRBCs. Hgb stabilized.  Saturating well on 2 L. Completed 5 days course of Cefdinir. Lower extremity Doppler without evidence of DVT. Today CXR stable. Chest tube output has been low for last 2 days- 3ml today. - PCCM signed off - IR removed chest tube today. Pt tolerated procedure well. - nasal canula to maintain sats 88-92% - BiPAP at night - daily chest x-ray - daily CBC - Multimodal pain control with lidoderm patch, IV toradol, IV tylenol, and fentanyl --Continue Baclofen 5 mg TID PRN for muscle spasms. - Pulm toilet, incentive spirometer when off BIPAP.   T12 destructive change suspicious for tumor MRI with change at T12 concerning for malignant or infectious etiology.  No clear primary  at this point. Has never had a mammogram, breast exam without overt abnormality. 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  As Per neurosurgery, it can be done outpatient.  We will revisit this with her next week, but may defer this to the outpatient setting. - Neurosurgery consulted - IR consulted for biopsy,  appreciate assistance, unfortunately patient cannot complete biopsy - outpatient mammogram, colonoscopy   Chronic HFpEF OSA - Continue home medications of Coreg, and Klor-Con - Continue lasix 40 mg - O2 as above - BiPAP at night   Chronic Venous Stasis -- Unna wrappings   Chronic ITP - Daily CBC - Hold off on VTE prophylaxis with this and her hemothorax  SNF vs Home  -Due to Pt's multiple comorbidities, OT and PT recommended Pt to go to SNF but she refused and wants to go Home with [facility] services. -Talked to Daughter [name] [phone]- She states she will be happy to keep her mom and will not let her go to Rehab facility if she doesn't want to go.   VTE prophylaxis: holding off with her thrombocytopenia and hemothoraax Prior to Admission Living Arrangement: home Anticipated Discharge Location: likely home, she has vehemently declined SNF Barriers to Discharge: medical workup, pain control  Dispo: anticipate discharge in 2-3 days  [place] Internal Medicine PGY-1  On Call Pager: [phone]   01/14/2021, 11:45 AM

## 2021-01-14 NOTE — Progress Notes (Signed)
Referring Physician(s): Velna Ochs (IMTS)  Supervising Physician: Ruthann Cancer  Patient Status:  [facility] - In-pt  Chief Complaint: Chest tube F/U  Subjective:  History of mechanical fall with subsequent right rib fractures and right hemothorax s/p right chest tube placement in IR 01/08/2021. Patient awake and alert laying in bed. States she is tired this AM. Right chest tube site c/d/i.  CXR this AM: 1. Overall stable appearance of the chest. No significant recurrent effusion is noted.   Allergies: Patient has no known allergies.  Medications: Prior to Admission medications   Medication Sig Start Date End Date Taking? Authorizing Provider  carvedilol (COREG) 3.125 MG tablet Take 1 tablet (3.125 mg total) by mouth 2 (two) times daily with a meal. 10/04/20  Yes [name], MD  diphenhydramine-acetaminophen (TYLENOL PM) 25-500 MG TABS tablet Take 1 tablet by mouth at bedtime as needed (For sleep).   Yes [provider]  furosemide (LASIX) 80 MG tablet Take 1 tablet (80 mg total) by mouth See admin instructions. Take 80mg  by mouth once daily.  Take an additional 80mg  daily as needed for weight gain of 3 pounds in one week or 5 pounds in one day. 10/02/20  Yes Aslam, Loralyn Freshwater, MD  loperamide (IMODIUM) 2 MG capsule Take 1 capsule (2 mg total) by mouth as needed for diarrhea or loose stools. 05/18/20  Yes Andrew Au, MD  potassium chloride (KLOR-CON) 10 MEQ tablet TAKE 1 TABLET(10 MEQ) BY MOUTH DAILY Patient taking differently: Take 10 mEq by mouth daily. 10/04/20  Yes Aslam, Loralyn Freshwater, MD  promethazine (PHENERGAN) 12.5 MG tablet Take 12.5 mg by mouth every 6 (six) hours as needed for nausea or vomiting.   Yes [provider]  vitamin B-12 1000 MCG tablet Take 1 tablet (1,000 mcg total) by mouth daily. 10/01/20  Yes Seawell, Jaimie A, DO     Vital Signs: BP 131/62 (BP Location: Left Arm)   Pulse 88   Temp 98.6 F (37 C) (Oral)   Resp 20   Ht 5' (1.524 m)    Wt 190 lb 11.2 oz (86.5 kg)   SpO2 98%   BMI 37.24 kg/m   Physical Exam Vitals and nursing note reviewed.  Constitutional:      General: She is not in acute distress.    Appearance: She is obese.  Pulmonary:     Effort: Pulmonary effort is normal. No respiratory distress.     Comments: On [place]. Right chest tube site without tenderness, erythema, drainage, or active bleeding; approximately 660 cc serosanguinous fluid in pleure-vac (approximately 10 cc increase in past 24 hours); tube to water seal with (-) air leak- right chest tube removed bedside and pressure dressing applied. Neurological:     Mental Status: She is alert.     Imaging: DG Chest Port 1 View  Result Date: 01/14/2021 CLINICAL DATA:  Follow-up right-sided pleural effusion EXAM: PORTABLE CHEST 1 VIEW COMPARISON:  01/13/2021 FINDINGS: Cardiac shadow remains enlarged. Right-sided pigtail catheter is again seen without evidence of pneumothorax. No sizable recurrent pleural effusion is noted. Mild bibasilar atelectasis is again seen. Postsurgical changes in the thoracic spine are noted. IMPRESSION: Overall stable appearance of the chest. No significant recurrent effusion is noted. Electronically Signed   By: Inez Catalina M.D.   On: 01/14/2021 08:17   DG Chest Port 1 View  Result Date: 01/13/2021 CLINICAL DATA:  Hemothorax on the right. EXAM: PORTABLE CHEST 1 VIEW COMPARISON:  January 12, 2021 FINDINGS: The right chest tube  remains in stable position without visualized pneumothorax. Platelike opacity in the right base is consistent with atelectasis. Opacity in left retrocardiac region is probably atelectasis. The cardiomediastinal silhouette is stable. No other changes. IMPRESSION: 1. The right chest tube remains in place without pneumothorax. 2. Increased subsegmental atelectasis in the right base. 3. Opacity in the left retrocardiac region may represent atelectasis. Electronically Signed   By: Dorise Bullion III M.D   On: 01/13/2021  08:31   DG Chest Port 1 View  Result Date: 01/12/2021 CLINICAL DATA:  Evaluate chest tube. EXAM: PORTABLE CHEST 1 VIEW COMPARISON:  01/11/2021 FINDINGS: Again noted is a pigtail tube in the right chest. Right chest tube is stable in position. Negative for pneumothorax. Again noted is elevation of the right hemidiaphragm and enlargement of the cardiac silhouette. Overall low lung volumes. No change in left basilar densities. IMPRESSION: 1. Stable appearance of the right chest tube without a pneumothorax. 2. Low lung volumes.  Persistent left basilar chest densities. Electronically Signed   By: Markus Daft M.D.   On: 01/12/2021 12:21   DG CHEST PORT 1 VIEW  Result Date: 01/11/2021 CLINICAL DATA:  Chest tube.  Hemothorax. EXAM: PORTABLE CHEST 1 VIEW COMPARISON:  January 10, 2021. FINDINGS: Stable cardiomegaly. Right-sided chest tube is unchanged in position. No pneumothorax is noted. Stable bibasilar subsegmental atelectasis. Probable small left pleural effusion is noted. Bony thorax is unremarkable. IMPRESSION: Stable position of right-sided chest tube. Bibasilar subsegmental atelectasis. Small left pleural effusion. Electronically Signed   By: Marijo Conception M.D.   On: 01/11/2021 12:31   VAS [place] UPPER EXTREMITY VENOUS DUPLEX  Result Date: 01/10/2021 UPPER VENOUS STUDY  Patient Name:  MCKINZIE SAKSA  Date of Exam:   01/10/2021 Medical Rec #: [id number]         Accession #:    [id number] Date of Birth: May 20, 1951         Patient Gender: F Patient Age:   70Y Exam Location:  [facility] Procedure:      VAS [place] UPPER EXTREMITY VENOUS DUPLEX Referring Phys: [id number] CAROLYN GUILLOUD --------------------------------------------------------------------------------  Indications: Pain in LT arm Limitations: Bandages. Comparison Study: No prior studies. Performing Technologist: Darlin Coco RDMS,RVT  Examination Guidelines: A complete evaluation includes B-mode imaging, spectral Doppler, color Doppler, and power  Doppler as needed of all accessible portions of each vessel. Bilateral testing is considered an integral part of a complete examination. Limited examinations for reoccurring indications may be performed as noted.  Right Findings: +----------+------------+---------+-----------+----------+-------+ RIGHT     CompressiblePhasicitySpontaneousPropertiesSummary +----------+------------+---------+-----------+----------+-------+ Subclavian    Full       Yes       Yes                      +----------+------------+---------+-----------+----------+-------+  Left Findings: +----------+------------+---------+-----------+----------+---------------------+ LEFT      CompressiblePhasicitySpontaneousProperties       Summary        +----------+------------+---------+-----------+----------+---------------------+ IJV           Full       Yes       Yes                                    +----------+------------+---------+-----------+----------+---------------------+ Subclavian    Full       Yes       Yes                                    +----------+------------+---------+-----------+----------+---------------------+  Axillary      Full       Yes       Yes                                    +----------+------------+---------+-----------+----------+---------------------+ Brachial      Full                                                        +----------+------------+---------+-----------+----------+---------------------+ Radial        Full                                    Some segments not                                                        well visualized due                                                         to bandaging/IV    +----------+------------+---------+-----------+----------+---------------------+ Ulnar         Full                                    Some segments not                                                        well  visualized due                                                         to bandaging/IV    +----------+------------+---------+-----------+----------+---------------------+ Cephalic      Full                                                        +----------+------------+---------+-----------+----------+---------------------+ Basilic       Full                                                        +----------+------------+---------+-----------+----------+---------------------+  Summary:  Right: No evidence of thrombosis in the subclavian.  Left: No evidence of deep  vein thrombosis in the upper extremity. No evidence of superficial vein thrombosis in the upper extremity.  *See table(s) above for measurements and observations.  Diagnosing physician: Jamelle Haring Electronically signed by Jamelle Haring on 01/10/2021 at 3:43:48 PM.    Final     Labs:  CBC: Recent Labs    01/11/21 0203 01/12/21 0021 01/13/21 0205 01/14/21 0216  WBC 13.2* 12.6* 12.0* 12.6*  HGB 7.9* 7.9* 8.8* 8.4*  HCT 26.0* 24.7* 29.7* 28.4*  PLT 106* 86* PLATELET CLUMPS NOTED ON SMEAR, COUNT APPEARS DECREASED 124*    COAGS: Recent Labs    04/19/20 0341 05/13/20 2149 06/29/20 1427 09/26/20 1337 01/08/21 0650  INR  --  1.1 1.0 1.1 1.2  APTT 31 24 30  33  --     BMP: Recent Labs    05/14/20 0614 05/15/20 0058 05/16/20 0110 05/17/20 0631 06/29/20 1427 01/12/21 0021 01/12/21 0717 01/13/21 0205 01/14/21 0216  NA 135 138 140 142   < > 134* 138 138 139  K 3.9 3.7 3.8 3.9   < > 5.5* 4.6 4.9 4.3  CL 92* 96* 97* 99   < > 97* 101 97* 97*  CO2 34* 33* 35* 35*   < > 28 31 34* 38*  GLUCOSE 96 136* 95 91   < > 129* 106* 109* 102*  BUN 35* 21 9 7*   < > 12 11 10 11   CALCIUM 8.5* 8.7* 9.1 9.3   < > 8.2* 8.4* 8.7* 8.7*  CREATININE 1.87* 0.85 0.55 0.56   < > 0.52 0.44 0.54 0.50  GFRNONAA 27* >60 >60 >60   < > >60 >60 >60 >60  GFRAA 32* >60 >60 >60  --   --   --   --   --    < > = values in this interval  not displayed.    LIVER FUNCTION TESTS: Recent Labs    07/08/20 0336 09/26/20 1337 09/27/20 0404 01/04/21 0049  BILITOT 1.8* 0.9 1.0 0.8  AST 18 24 17 23   ALT 21 22 19  36  ALKPHOS 60 114 91 90  PROT 5.5* 6.4* 5.7* 6.2*  ALBUMIN 3.4* 3.0* 2.5* 3.5    Assessment and Plan:  History of mechanical fall with subsequent right rib fractures and right hemothorax s/p right chest tube placement in IR 01/08/2021. Right chest tube stable with approximately 660 cc serosanguinous fluid in pleure-vac (approximately 10 cc increase in past 24 hours), tube to water seal with (-) air leak. CXR this AM revealed no significant recurrent effusion noted. Discussed case with Dr. Serafina Royals who recommends chest tube removal.   PROCEDURE SUMMARY: Successful bedside removal of non-tunneled right chest tube, removed intact. No immediate complications.  EBL = 0 mL. Pressure dressing (petrolum gauze + gauze + tegaderm) applied to site. Patient tolerated well.  Follow-up CXR ordered for 1 hour post-removal.  Post-removal instructions: 1- Ok to shower 48 hours post-removal. 2- No submerging (swimming, bathing) for 7 days post-removal. 3- Ok to remove bandage after first shower, no further dressing changes needed- ensure area remains clean and dry until fully healed.  In addition, IR consulted by Dr. Philipp Ovens for possible image-guided T12 lesion biopsy. Spoke with Dr. Gilford Rile (IMTS) via telephone- at this time, patient is still refusing procedure. No plans for IR interventions on IP basis- will delete order. If procedure is desired on OP basis, please place "CT biopsy" order on discharge. Dr. Gilford Rile made aware.  Further plans per IMTS- appreciate and agree with management. Please  call IR with questions/concerns.   Electronically Signed: Earley Abide, PA-C 01/14/2021, 10:57 AM   I spent a total of 25 Minutes at the the patient's bedside AND on the patient's hospital floor or unit, greater than 50% of which  was counseling/coordinating care for right hemothorax s/p right chest tube placement.

## 2021-01-14 NOTE — Consult Note (Signed)
   [facility] CM Inpatient Consult   01/14/2021  OREATHA FABRY 1951/05/01 [id number]   Follow up: Referral 01/11/21 for post hospital follow up as patient is being dismissed from [facility] practice [facility] Medicare  Referral for follow up as patient will no longer be a member in the [facility] Internal Medicine practice. Spoke with inpatient [facility] RNCM regarding post hospital barriers and transition of care issues and patient desiring information and desire to have a physician doing home visits.  Met with patient on Friday, 01/11/21.  Patient states she prefers to have a physician that can see her in the home.  She states she is homebound and her daughter and son-in-law works, and the last time she made it to an appointment, he had to take off of work and they had long wait times and it was very upsetting to her. Patient also expressed she had issues at home with her NIV and felt it wasn't working right and states, "I guess I got upset and the company too and so I told them to just take it back because it wasn't working for me." She states, "I guess now I am probably really going to need it because I know the angels were coming for me but I made it." Patient talked about her experience of feeling the angels around her.  Encouraged her to ventilate.  Explained to patient regarding [employer] a physician network based organization for their patients to have certain benefits. Gave her a brochure and 24 hour magnet if she decides on a different [facility] provider.  Explained that currently if she chooses a home visiting provider, that it may not be a provider in this [facility] network.  She verbalized understanding and states she feel a home visiting provider would be best for her and her situation due to transportation and family dynamics.  Explained to the patient to check with [employer]'s customer service to see if she has benefits for MD rides.  She verbalized understanding if she "has to go  anywhere" she says.  Followed up with inpatient [facility] RNCM with updates of conversation and patient desire to have a provider with home visits.  For questions, please contact:  Natividad Brood, RN BSN [facility] Liaison  [phone] business mobile phone Toll free office [phone]  Fax number: [phone] [email or web address]

## 2021-01-14 NOTE — Discharge Instructions (Signed)
Dear [name],   Thank you for letting us participate in your care! In this section, you will find a brief hospital admission summary of why you were admitted to the hospital, what happened during your admission, your diagnosis/diagnoses, and recommended follow up.   You were admitted because you were experiencing Chest pain and Shortness of breath after a fall .   Your testing revealed Traumatic rib fractures and Traumatic hemothorax.  Your testing also revealed T12 destructive change suspicious for tumor.   You were treated with Chest tube placement and removal of fluid and pain medications and antibiotics.   You were also seen by PT/OT. They recommended Home Health OT and PT and recommendations to SNF.   Your condition improved and you were discharged from the hospital for meeting this goal.   You were also put on Bipap at night which helped with your breathing.   POST-HOSPITAL & CARE INSTRUCTIONS 1. Please F/U with Interventional Radiology for a Biopsy of T12 lesion. 2. Please let PCP/Specialists know of any changes in medications that were made.  3. Please see medications section of this packet for any medication changes.   DOCTOR'S APPOINTMENTS & FOLLOW UP No future appointments.   Thank you for choosing [facility]! Take care and be well!  Internal [facility]  [place], [place] [address] [phone]           Rib Fracture  A rib fracture is a break or crack in one of the bones of the ribs. The ribs are like a cage that goes around your upper chest. A broken or cracked rib is often painful, but most do not cause other problems. Most rib fractures usually heal on their own in 1-3 months. What are the causes?  Doing movements over and over again with a lot of force, such as pitching a baseball or having a very bad cough.  A direct hit to the chest.  Cancer  that has spread to the bones. What are the signs or symptoms?  Pain when you breathe in or cough.  Pain when someone presses on the injured area.  Feeling short of breath. How is this treated? Treatment depends on how bad the fracture is. In general:  Most rib fractures usually heal on their own in 1-3 months.  Healing may take longer if you have a cough or are doing activities that make the injury worse.  While you heal, you may be given medicines to control pain.  You will also be taught deep breathing exercises.  Very bad injuries may require a stay at the hospital or surgery. Follow these instructions at home: Managing pain, stiffness, and swelling  If told, put ice on the injured area. To do this: ? Put ice in a plastic bag. ? Place a towel between your skin and the bag. ? Leave the ice on for 20 minutes, 2-3 times a day. ? Take off the ice if your skin turns bright red. This is very important. If you cannot feel pain, heat, or cold, you have a greater risk of damage to the area.  Take over-the-counter and prescription medicines only as told by your doctor. Activity  Avoid activities that cause pain to the injured area. Protect your injured area.  Slowly increase activity as told by your doctor. General instructions  Do deep breathing exercises as told by your doctor. You may be told to: ?  Take deep breaths many times a day. ? Cough several times a day while hugging a pillow. ? Use a device (incentive spirometer) to do deep breathing many times a day.  Drink enough fluid to keep your pee (urine) clear or pale yellow.  Do not wear a rib belt or binder.  Keep all follow-up visits. Contact a doctor if:  You have a fever. Get help right away if:  You have trouble breathing.  You are short of breath.  You cannot stop coughing.  You cough up thick or bloody spit.  You feel like you may vomit (nauseous), vomit, or have belly (abdominal) pain.  Your pain  gets worse and medicine does not help. These symptoms may be an emergency. Get help right away. Call your local emergency services (911 in the [place]  Do not wait to see if the symptoms will go away.  Do not drive yourself to the hospital. Summary  A rib fracture is a break or crack in one of the bones of the ribs.  Apply ice to the injured area and take medicines for pain as told by your doctor.  Take deep breaths and cough several times a day. Hug a pillow every time you cough. This information is not intended to replace advice given to you by your health care provider. Make sure you discuss any questions you have with your health care provider. Document Revised: 12/23/2019 Document Reviewed: 12/23/2019 Elsevier Patient Education  2021 [employer].

## 2021-01-15 ENCOUNTER — Inpatient Hospital Stay (HOSPITAL_COMMUNITY): Payer: Medicare PPO

## 2021-01-15 LAB — BASIC METABOLIC PANEL
Anion gap: 5 (ref 5–15)
BUN: 14 mg/dL (ref 8–23)
CO2: 41 mmol/L — ABNORMAL HIGH (ref 22–32)
Calcium: 8.7 mg/dL — ABNORMAL LOW (ref 8.9–10.3)
Chloride: 92 mmol/L — ABNORMAL LOW (ref 98–111)
Creatinine, Ser: 0.55 mg/dL (ref 0.44–1.00)
GFR, Estimated: >60 mL/min (ref >60)
Glucose, Bld: 140 mg/dL — ABNORMAL HIGH (ref 70–99)
Potassium: 4.3 mmol/L (ref 3.5–5.1)
Sodium: 138 mmol/L (ref 135–145)

## 2021-01-15 LAB — CBC
HCT: 27.4 % — ABNORMAL LOW (ref 36.0–46.0)
Hemoglobin: 8.1 g/dL — ABNORMAL LOW (ref 12.0–15.0)
MCH: 28.1 pg (ref 26.0–34.0)
MCHC: 29.6 g/dL — ABNORMAL LOW (ref 30.0–36.0)
MCV: 95.1 fL (ref 80.0–100.0)
Platelets: 139 10*3/uL — ABNORMAL LOW (ref 150–400)
RBC: 2.88 MIL/uL — ABNORMAL LOW (ref 3.87–5.11)
RDW: 17.5 % — ABNORMAL HIGH (ref 11.5–15.5)
WBC: 13.9 10*3/uL — ABNORMAL HIGH (ref 4.0–10.5)
nRBC: 0.1 % (ref 0.0–0.2)

## 2021-01-15 MED ORDER — SIMETHICONE 80 MG PO CHEW
80.0000 mg | CHEWABLE_TABLET | Freq: Once | ORAL | Status: AC
  Administered 2021-01-15: 80 mg via ORAL
  Filled 2021-01-15: qty 1

## 2021-01-15 MED ORDER — OXYCODONE-ACETAMINOPHEN 5-325 MG PO TABS
1.0000 | ORAL_TABLET | ORAL | Status: DC | PRN
  Administered 2021-01-15 – 2021-01-19 (×10): 1 via ORAL
  Filled 2021-01-15 (×10): qty 1

## 2021-01-15 MED ORDER — OXYCODONE HCL 5 MG PO TABS
5.0000 mg | ORAL_TABLET | ORAL | Status: DC | PRN
  Administered 2021-01-15 – 2021-01-19 (×10): 5 mg via ORAL
  Filled 2021-01-15 (×10): qty 1

## 2021-01-15 MED ORDER — OXYCODONE-ACETAMINOPHEN 5-325 MG PO TABS: 1.0000 | ORAL_TABLET | ORAL | Status: DC | PRN

## 2021-01-15 NOTE — Progress Notes (Signed)
PT Cancellation Note  Patient Details Name: [name] MRN: [id number] DOB: 01-06-1951   Cancelled Treatment:    Reason Eval/Treat Not Completed: (P) Patient declined, no reason specified (Pt refusing EOB mobility and states "I just got comfortable after they changed my bed and fixed my purewick. I don't want to practice it right now, I need my muscle relaxers." (RN notified))   Carlene Coria 01/15/2021, 3:04 PM

## 2021-01-15 NOTE — TOC Progression Note (Signed)
Transition of Care (TOC) - Progression Note  Valentina Gu, BSN Transitions of Care Unit 4E- RN Case Manager See Treatment Team for direct phone #     Patient Details  Name: JAIMIE PIPPINS MRN: [id number] Date of Birth: [birth year]  Transition of Care [facility]) CM/SW Contact  Dahlia Client Romeo Rabon, RN Phone Number: 01/15/2021, 4:45 PM  Clinical Narrative:    Follow up done with pt at bedside for doing online registration with doctor's making house calls- completed registration with pt online with tablet at the bedside- printed [facility] HIPPA notice and provided copy to pt.  Will f/u with [facility] tomorrow regarding registration and processing it for pt to have tele health visit post discharge.   [employer] continues to follow for NIV needs at home- NIV pending insurance approval- if approved will have NIV delivered to the home.   Pt requesting EMS transport home.    Expected Discharge Plan: [place] Barriers to Discharge: Continued Medical Work up  Expected Discharge Plan and Services Expected Discharge Plan: [place]   Discharge Planning Services: CM Consult Post Acute Care Choice: Home Health,Resumption of Svcs/PTA Provider Living arrangements for the past 2 months: Single Family Home                 DME Arranged: NIV DME Agency: [employer] Date DME Agency Contacted: 01/11/21 Time DME Agency Contacted: [phone]   [place]: [place] Agency: [facility] [employer]         Social Determinants of Health ([place]) Interventions    Readmission Risk Interventions Readmission Risk Prevention Plan 09/30/2020  Transportation Screening Complete  Medication Review Press photographer) Complete  PCP or Specialist appointment within 3-5 days of discharge Complete  HRI or [place] Complete  SW Recovery Care/Counseling Consult Complete  [place] Patient Refused  Some recent data might be hidden

## 2021-01-15 NOTE — Progress Notes (Signed)
5/3-Respiratory care note- Attempted to perform a pulmonary function test with patient. She is unable to perform test due to splinting and pain. Several attempts made. Pt unable to achieve any measurable numbers. Pt only achieving 460ml to an occasional 562ml with use of incentive spirometer.

## 2021-01-15 NOTE — Care Management Important Message (Signed)
Important Message  Patient Details  Name: [name] MRN: [id number] Date of Birth: 03-03-1951   Medicare Important Message Given:  Yes     Shelda Altes 01/15/2021, 9:32 AM

## 2021-01-15 NOTE — Progress Notes (Addendum)
Subjective: No overnight event. Pt seen and examined today at the bedside.  States her breathing is doing well. Still having pain all day long, pain worse with moving and causing worsening her muscle spasms. Pt states fentanyl effect runs out after some time and she would prefer something stronger.  Patient adamantly refused SNF with rehab. States she will never go back to another one after having a poor experience prior to this. Would like to have PT at home.   Objective:  Vital signs in last 24 hours: Vitals:   01/14/21 2344 01/15/21 0000 01/15/21 0002 01/15/21 0459  BP: 129/66   (!) 144/61  Pulse: 94 92 95 81  Resp: 20 (!) 31 20 20   Temp: 98.2 F (36.8 C)   98.7 F (37.1 C)  TempSrc: Oral   Axillary  SpO2: 94% 96% 94% 100%  Weight:      Height:        Physical Exam Vitals and nursing note reviewed.  Constitutional: appears comfortable Head: Normocephalic and atraumatic Cardiovascular: regular rate and rhythm, normal heart sounds Pulmonary: on nasal canula 2L, ascultation limited by habitus and positioning but clear bilaterally Skin: warm and dry, lower extremities with Unna wraps. Neurological: alert, oriented Psych- Mood and Affect - Normal   Assessment/Plan:  [name] is a 70 y/o F with hx of chronic respiratory failure on 3-4L chronically, morbid obesity, HFpEF, chronic ITP, chronic leg wounds who presents with rib fractures after mechanical fall. Acute on chronic hypercapneic and hypoxic respiratory failure, also found to have hemothorax. Respiratory status improved after placing chest tube. S/p chest tube removal yesterday. Principal Problem:   Pneumothorax Active Problems:   Pressure injury of skin   Multiple closed fractures of ribs of right side   Fall against object   Acute respiratory distress   Rib fractures   Hemothorax on right   Pleural effusion   Lesion of bone of thoracic spine   Chest tube in place  Acute on chronic hypercapneic and hypoxic  respiratory failure, on 3-4L chronically Ribs fracture due to mechanical fall Traumatic hemothorax in the setting of chronic ITP Symptomatic anemia Rib fractures and hemothorax after mechanical fall. Clinical course has been complicated by worsening hypercarbia and development of hemothorax, initially drained by thoracentesis but reaccumulated so pigtail catheter placed. S/p 1 unit pRBCs. Hgb stabilized.  Saturating well on 2 L. Completed 5 days course of Cefdinir. Lower extremity Doppler without evidence of DVT. Pt still having a lot of pain all over but denies SOB. States she wants something stronger for pain.  - IR removed chest tube yesterday. Last CXR shows improvement in right lung base ventilation with residual platelike atelectasis. - nasal canula to maintain sats 88-92% - BiPAP at night - daily CBC - Multimodal pain control with lidoderm patch (Q12hr) andTylenol 1000 mg Q8H. --Stop Fentanyl. --Start Percocet 10-325 every 4 hours for pain PRN. --Continue Baclofen 5 mg TID PRN for muscle spasms. - Pulm toilet, incentive spirometer when off BIPAP.   T12 destructive change suspicious for tumor MRI with change at T12 concerning for malignant or infectious etiology.  No clear primary at this point.  Patient refused for biopsy at this time.  As Per neurosurgery, it can be done outpatient.   - IR consulted for biopsy, appreciate assistance, unfortunately patient cannot complete biopsy - outpatient Biopsy of the t12 lesion, mammogram, colonoscopy   Chronic HFpEF OSA - Continue lasix 40 mg - O2 as above - BiPAP at night  Chronic Venous Stasis -- Unna wrappings   SNF vs Home  -Due to Pt's multiple comorbidities, OT and PT recommended Pt to go to SNF but she refused and wants to go Home with [facility] services.Confermed with daughter who is ok for her to come back home.   VTE prophylaxis: holding off with her thrombocytopenia and hemothoraax Prior to Admission Living Arrangement:  home Anticipated Discharge Location: likely home, she has vehemently declined SNF Barriers to Discharge: medical workup, pain control  Dispo: anticipate discharge in 1-2 days  [facility] PGY-1  On Call Pager: [phone]   01/15/2021, 6:23 AM

## 2021-01-15 NOTE — Progress Notes (Signed)
Occupational Therapy Treatment Patient Details Name: [name] MRN: [id number] DOB: [birth year] Today's Date: 01/15/2021    History of present illness Pt adm 4/20 with rib fractures after mechanical fall. Acute on chronic hypercapneic and hypoxic respiratory failure, also found to have hemothorax. Had thoracentesis with removal of 600cc of frank blood on 4/23 & chest tube placed 4/26.  Chest tube removed 5/2. PMH - chronic respiratory failure on 3-4L chronically, morbid obesity, HFpEF, chronic ITP, chronic leg wounds   OT comments  Attempted to see pt in conjunction with PTA however pt declining OOB session or BADL participation. Pt was agreeable to educational session in regards to DME needs and energy conservation strategies for home. Pt reports her plan is to be spend most of the day in the recliner and transfer to<>from BSC for toileting. Pt reports her friend will come over to bathe her and daughter will provide meals. Pt insistent she will be able to do everything she needs to do at home. Continue to feel that ST rehab is the safest option however if pt declines feel HH is appropriate, will follow acutely per POC.   Follow Up Recommendations  SNF;Supervision/Assistance - 24 hour;Other (comment) (Pt needs SNF however at this time is refusing and requesting HH)    Equipment Recommendations  Tub/shower bench    Recommendations for Other Services      Precautions / Restrictions Precautions Precautions: Fall Precaution Comments: rib fxs, flexiseal, O2 dependent Other Brace: on 3L O2 [place] at home Restrictions Weight Bearing Restrictions: No       Mobility Bed Mobility               General bed mobility comments: pt declined    Transfers                 General transfer comment: pt declined    Balance                                           ADL either performed or assessed with clinical judgement   ADL Overall ADL's : Needs  assistance/impaired                                       General ADL Comments: pt declining OOB mobility or ADLS as pt reports she just go cleaned up and got her purewick replaced. pt reports at home she will be able to mobilize as needed. pts plan is to mostly staying in recliner and transfer to<>from BSC for toileting and pt plans to have friend come over and bathe her from recliner, pts daughter to provide meals at home. provided education and hand out on energy conservation strategies     Vision       Perception     Praxis      Cognition Arousal/Alertness: Awake/alert Behavior During Therapy: Agitated;Flat affect (self limiting) Overall Cognitive Status: No family/caregiver present to determine baseline cognitive functioning                                 General Comments: pt insistent that she willl be able to do everything she needs to do at home        Exercises  Shoulder Instructions       General Comments education and handout provided on energy conservation strategies for home to facilitate functional independence with ADLs at home    Pertinent Vitals/ Pain       Pain Assessment: Faces Faces Pain Scale: Hurts little more Pain Location: general discomfort Pain Descriptors / Indicators: Discomfort;Grimacing Pain Intervention(s): Limited activity within patient's tolerance;Monitored during session;Patient requesting pain meds-RN notified  Home Living                                          Prior Functioning/Environment              Frequency  Min 2X/week        Progress Toward Goals  OT Goals(current goals can now be found in the care plan section)  Progress towards OT goals: Not progressing toward goals - comment (declines mobility or AdLs)  Acute Rehab OT Goals Patient Stated Goal: to go home OT Goal Formulation: With patient Time For Goal Achievement: 01/25/21 Potential to Achieve Goals:  [place] Discharge plan remains appropriate;Frequency remains appropriate    Co-evaluation                 AM-PAC OT "6 Clicks" Daily Activity     Outcome Measure   Help from another person eating meals?: A Little Help from another person taking care of personal grooming?: A Little Help from another person toileting, which includes using toliet, bedpan, or urinal?: Total Help from another person bathing (including washing, rinsing, drying)?: Total Help from another person to put on and taking off regular upper body clothing?: A Lot Help from another person to put on and taking off regular lower body clothing?: Total 6 Click Score: 11    End of Session    OT Visit Diagnosis: Unsteadiness on feet (R26.81);Other abnormalities of gait and mobility (R26.89);Muscle weakness (generalized) (M62.81)   Activity Tolerance Patient limited by fatigue;Patient limited by pain;Other (comment) (self limiting)   Patient Left in bed;with call bell/phone within reach;with bed alarm set   Nurse Communication Mobility status;Patient requests pain meds        Time: 1439-1451 OT Time Calculation (min): 12 min  Charges: OT General Charges $OT Visit: 1 Visit OT Treatments $Self Care/Home Management : 8-22 mins  Harley Alto., COTA/L Acute Rehabilitation Services [phone] [place] 01/15/2021, 4:15 PM

## 2021-01-15 NOTE — Progress Notes (Signed)
Patient is on AVAPS mode nocturnal BiPap.  Target vT is 350, with PIP 12-24.  Patient is tolerating well.

## 2021-01-16 LAB — CBC
HCT: 28.8 % — ABNORMAL LOW (ref 36.0–46.0)
Hemoglobin: 8.5 g/dL — ABNORMAL LOW (ref 12.0–15.0)
MCH: 28.2 pg (ref 26.0–34.0)
MCHC: 29.5 g/dL — ABNORMAL LOW (ref 30.0–36.0)
MCV: 95.7 fL (ref 80.0–100.0)
Platelets: UNDETERMINED 10*3/uL (ref 150–400)
RBC: 3.01 MIL/uL — ABNORMAL LOW (ref 3.87–5.11)
RDW: 17.6 % — ABNORMAL HIGH (ref 11.5–15.5)
WBC: 12.5 10*3/uL — ABNORMAL HIGH (ref 4.0–10.5)
nRBC: 0.2 % (ref 0.0–0.2)

## 2021-01-16 LAB — BASIC METABOLIC PANEL
Anion gap: 3 — ABNORMAL LOW (ref 5–15)
BUN: 12 mg/dL (ref 8–23)
CO2: 37 mmol/L — ABNORMAL HIGH (ref 22–32)
Calcium: 8.4 mg/dL — ABNORMAL LOW (ref 8.9–10.3)
Chloride: 96 mmol/L — ABNORMAL LOW (ref 98–111)
Creatinine, Ser: 0.57 mg/dL (ref 0.44–1.00)
GFR, Estimated: >60 mL/min (ref >60)
Glucose, Bld: 113 mg/dL — ABNORMAL HIGH (ref 70–99)
Potassium: 4.3 mmol/L (ref 3.5–5.1)
Sodium: 136 mmol/L (ref 135–145)

## 2021-01-16 MED ORDER — SIMETHICONE 80 MG PO CHEW
80.0000 mg | CHEWABLE_TABLET | Freq: Once | ORAL | Status: AC
  Administered 2021-01-16: 80 mg via ORAL
  Filled 2021-01-16: qty 1

## 2021-01-16 NOTE — Progress Notes (Signed)
Subjective: No overnight event. Pt seen and examined today at the bedside.  Not feeling up to going home today. Pain eases off when takes percocet, but when it wears off the pain gets worse. Percocet works better than Fentanyl. Would like one more day to get mentally prepared to go home. Discussed being more active today in hospital. Agrees to working with PT today and sitting up in chair.   Objective:  Vital signs in last 24 hours: Vitals:   01/15/21 1927 01/15/21 2340 01/16/21 0054 01/16/21 0413  BP: 119/62 140/64  120/62  Pulse: (!) 102 95 86 83  Resp: 20 20 16 16   Temp: 98.7 F (37.1 C) 98.7 F (37.1 C)  98.7 F (37.1 C)  TempSrc: Oral Oral  Oral  SpO2: 94% 94% 100% 100%  Weight:      Height:         Physical Exam Vitals and nursing note reviewed.  Constitutional: appears comfortable Head: Normocephalic and atraumatic Cardiovascular: regular rate and rhythm, normal heart sounds Pulmonary: on nasal canula 2L, ascultation limited by habitus and positioning but clear bilaterally Skin: warm and dry, lower extremities with Unna wraps. Neurological: alert, oriented Psych- Mood and Affect - Normal   Assessment/Plan:  [name] is a 70 y/o F with hx of chronic respiratory failure on 3-4L chronically, morbid obesity, HFpEF, chronic ITP, chronic leg wounds who presents with rib fractures after mechanical fall. Acute on chronic hypercapneic and hypoxic respiratory failure, also found to have hemothorax. Respiratory status improved after placing chest tube. Chest tube removed 01/14/21. Principal Problem:   Pneumothorax Active Problems:   Pressure injury of skin   Multiple closed fractures of ribs of right side   Fall against object   Acute respiratory distress   Rib fractures   Hemothorax on right   Pleural effusion   Lesion of bone of thoracic spine   Chest tube in place  Acute on chronic hypercapneic and hypoxic respiratory failure, on 3-4L chronically Ribs fracture due  to mechanical fall Traumatic hemothorax in the setting of chronic ITP Symptomatic anemia Rib fractures and hemothorax after mechanical fall. Hgb stabilized. IR removed chest tube. Saturating well on 2 L. Fentanyl was changed to Percocet yesterday because effect was not lasting long. Pt still having pain. Pain eases off when takes percocet, but when it wears off the pain gets worse. Percocet works better than Fentanyl. Pt needs 1 more day to get mentally prepare to go home. Hb stable at 8.1. - nasal canula to maintain sats 88-92% - BiPAP at night - daily CBC - Multimodal pain control with Percocet 10-325 every 4 hours for pain PRN. lidoderm patch (Q12hr) and Tylenol 1000 mg Q8H  --Continue Baclofen 5 mg TID PRN for muscle spasms. - Pulm toilet, incentive spirometer when off BIPAP. --Continue working with PT.    T12 destructive change suspicious for tumor MRI with change at T12 concerning for malignant or infectious etiology.  No clear primary at this point.  Patient refused for biopsy at this time.  As Per neurosurgery, it can be done outpatient.   - outpatient Biopsy of the t12 lesion, mammogram, colonoscopy   Chronic HFpEF OSA BP stable. Doesn't look volume overloaded.  - Continue lasix 40 mg - O2 as above - BiPAP at night   Chronic Venous Stasis -- Unna wrappings   SNF vs Home  - Pt adamentaly refused to go to SNF and wants to go Home with [facility] services. Confirmed with daughter who is  ok for her to come back home.   VTE prophylaxis: holding off with her thrombocytopenia and hemothoraax Prior to Admission Living Arrangement: home Anticipated Discharge Location: likely home, she has vehemently declined SNF Barriers to Discharge: pain control  Dispo: anticipate discharge in 1-2 days  [facility] PGY-1  On Call Pager: [phone]   01/16/2021, 6:10 AM

## 2021-01-16 NOTE — Progress Notes (Signed)
Physical Therapy Treatment Patient Details Name: [name] MRN: [id number] DOB: 1951-07-07 Today's Date: 01/16/2021    History of Present Illness Pt adm 4/20 with rib fractures after mechanical fall. Acute on chronic hypercapneic and hypoxic respiratory failure, also found to have hemothorax. Had thoracentesis with removal of 600cc of frank blood on 4/23 & chest tube placed 4/26.  Chest tube removed 5/2. PMH - chronic respiratory failure on 3-4L chronically, morbid obesity, HFpEF, chronic ITP, chronic leg wounds.    PT Comments    Pt received in supine, agreeable to therapy session after premedication and with fair participation and good tolerance for bed mobility and transfer training. Pt able to perform stand pivot transfer from chair>bed to simulate home setup (she will need to be able to get to wheelchair/BSC with daughter assist) with up to +77modA but needs total A to return to bed from seated position, per pt she will be sleeping in recliner so will not need to do bed transfers. She continues to require increased time/encouragement to perform all functional mobility tasks due to anxiety/pain and self-limiting behaviors. Pt continues to benefit from PT services to progress toward functional mobility goals. Continue to recommend SNF as pt needing +2 assist for safety currently however pt refusing so recommend HHPT and max HH services.   Follow Up Recommendations  Supervision/Assistance - 24 hour;Home health PT;Supervision for mobility/OOB (would most benefit from SNF but pt refusing; +2 assist if possible for mobility)     Equipment Recommendations  Other (comment) (hoyer lift, lift chair)    Recommendations for Other Services       Precautions / Restrictions Precautions Precautions: Fall Precaution Comments: rib fxs, flexiseal, O2 dependent Restrictions Weight Bearing Restrictions: No    Mobility  Bed Mobility Overal bed mobility: Needs Assistance Bed Mobility: Supine to  Sit;Sit to Supine     Supine to sit: +2 for physical assistance;Max assist;HOB elevated Sit to supine: +2 for physical assistance;Total assist   General bed mobility comments: increased time and cues for sequencing, pt deferring to attempt log roll for improved back comfort and needed +2 (trunk/LE) assist to perform with bed rail support    Transfers Overall transfer level: Needs assistance Equipment used: Rolling walker (2 wheeled) (also [place]) Transfers: Sit to/from [employer] to Stand: Mod assist;+2 physical assistance;+2 safety/equipment;From elevated surface Stand pivot transfers: +2 physical assistance;Mod assist       General transfer comment: from chair>RW with modA to stabilize and from elevated bed> stedy with min/modA +2  Ambulation/Gait Ambulation/Gait assistance: Mod assist;+2 physical assistance;+2 safety/equipment Gait Distance (Feet): 3 Feet Assistive device: Rolling walker (2 wheeled) Gait Pattern/deviations: Step-to pattern;Shuffle;Decreased step length - left Gait velocity: <0.1 m/s Gait velocity interpretation: <1.8 ft/sec, indicate of risk for recurrent falls General Gait Details: pt anxious and increased respiratory rate (20's-30 rpm), does better with cues for pursed-lip breathing, HR >110 bpm, SpO2 poor pleth reading during transfer (says 71% with tight grip on RW, but when finger sensor readjusted, reading 93% on 2L O2 [place]); small shuffled steps from chair>bed, needs assist to manage RW and for step by step sequencing   Stairs             Wheelchair Mobility    Modified Rankin (Stroke Patients Only)       Balance Overall balance assessment: Needs assistance Sitting-balance support: Feet supported;Single extremity supported Sitting balance-Leahy Scale: Fair Sitting balance - Comments: able to sit with min guard to Supervision ~5 mins pre/post transfer but  prefers 1-2 UE support   Standing balance support: Bilateral upper  extremity supported Standing balance-Leahy Scale: Poor Standing balance comment: reliant on +2 for safety, needing minA to modA for standing balance; fear of falls verbalized                            Cognition Arousal/Alertness: Awake/alert Behavior During Therapy: Flat affect (self limiting) Overall Cognitive Status: No family/caregiver present to determine baseline cognitive functioning Area of Impairment: Attention;Memory;Following commands;Safety/judgement;Awareness;Problem solving                   Current Attention Level: Sustained;Selective Memory: Decreased recall of precautions;Decreased short-term memory Following Commands: Follows one step commands with increased time;Follows one step commands consistently Safety/Judgement: Decreased awareness of deficits;Decreased awareness of safety Awareness: Emergent Problem Solving: Slow processing;Decreased initiation;Difficulty sequencing;Requires verbal cues;Requires tactile cues General Comments: pt insistent that she willl be able to do everything she needs to do at home but anxious when staff prompting her to perform and needs increased time/max cues to perform all tasks      Exercises General Exercises - Lower Extremity Heel Slides: AAROM;5 reps;Both;Supine Other Exercises Other Exercises: brought HEP handout to room for pt to perform, encouraged 3x/day    General Comments General comments (skin integrity, edema, etc.): See gait comments for VS, SpO2 >92% on 2L O2 [place] when good pleth reading able to be obtained      Pertinent Vitals/Pain Pain Assessment: Faces Faces Pain Scale: Hurts even more Pain Location: "muscle spasms" especially in RLE and low back Pain Descriptors / Indicators: Discomfort;Grimacing;Spasm;Moaning Pain Intervention(s): Monitored during session;Premedicated before session;Repositioned    Home Living                      Prior Function            PT Goals (current  goals can now be found in the care plan section) Acute Rehab PT Goals Patient Stated Goal: to go home PT Goal Formulation: With patient Time For Goal Achievement: 01/26/21 Potential to Achieve Goals: Good Progress towards PT goals: Progressing toward goals    Frequency    Min 3X/week      PT Plan Current plan remains appropriate    Co-evaluation PT/OT/SLP Co-Evaluation/Treatment: Yes Reason for Co-Treatment: Complexity of the patient's impairments (multi-system involvement);Necessary to address cognition/behavior during functional activity;For patient/therapist safety;To address functional/ADL transfers PT goals addressed during session: Mobility/safety with mobility;Balance;Proper use of DME;Strengthening/ROM        AM-PAC PT "6 Clicks" Mobility   Outcome Measure  Help needed turning from your back to your side while in a flat bed without using bedrails?: A Lot Help needed moving from lying on your back to sitting on the side of a flat bed without using bedrails?: A Lot Help needed moving to and from a bed to a chair (including a wheelchair)?: A Lot Help needed standing up from a chair using your arms (e.g., wheelchair or bedside chair)?: A Lot Help needed to walk in hospital room?: Total Help needed climbing 3-5 steps with a railing? : Total 6 Click Score: 10    End of Session Equipment Utilized During Treatment: Gait belt;Oxygen Activity Tolerance: Patient tolerated treatment well;Patient limited by pain Patient left: in bed;with call bell/phone within reach;with bed alarm set Nurse Communication: Mobility status;Other (comment) (needs +2 to get to chair, can use St[place] if needed) PT Visit Diagnosis: Muscle weakness (generalized) (M62.81);Difficulty in walking,  not elsewhere classified (R26.2);Pain;History of falling (Z91.81) Pain - Right/Left: Right Pain - part of body:  (leg and ribs)     Time: 5035-4656 PT Time Calculation (min) (ACUTE ONLY): 48 min  Charges:   $Therapeutic Activity: 23-37 mins                     Anvay Tennis P., PTA Acute Rehabilitation Services Pager: [phone] Office: [place] 01/16/2021, 1:30 PM

## 2021-01-16 NOTE — Progress Notes (Signed)
Occupational Therapy Treatment Patient Details Name: [name] MRN: [id number] DOB: 02-Oct-1950 Today's Date: 01/16/2021    History of present illness Pt adm 4/20 with rib fractures after mechanical fall. Acute on chronic hypercapneic and hypoxic respiratory failure, also found to have hemothorax. Had thoracentesis with removal of 600cc of frank blood on 4/23 & chest tube placed 4/26.  Chest tube removed 5/2. PMH - chronic respiratory failure on 3-4L chronically, morbid obesity, HFpEF, chronic ITP, chronic leg wounds.   OT comments  Pt progressing towards acute OT goals. Able to stand and take pivotal steps to simulate SPT to [facility]; +2 mod A needed. Pt reports she will have +1 intermittent assist from daughter at home. Pt continues to need encouragement to complete functional tasks, remains self-limiting. D/c recommendation remains appropriate.    Follow Up Recommendations  SNF;Supervision/Assistance - 24 hour;Other (comment) (Pt needs SNF however at this time is refusing and requesting HH)    Equipment Recommendations  Tub/shower bench    Recommendations for Other Services      Precautions / Restrictions Precautions Precautions: Fall Precaution Comments: rib fxs, flexiseal, O2 dependent Other Brace: on 3L O2 [place] at home Restrictions Weight Bearing Restrictions: No       Mobility Bed Mobility Overal bed mobility: Needs Assistance Bed Mobility: Supine to Sit;Sit to Supine     Supine to sit: +2 for physical assistance;Max assist;HOB elevated Sit to supine: +2 for physical assistance;Total assist   General bed mobility comments: increased time and cues for sequencing, pt deferring to attempt log roll for improved back comfort and needed +2 (trunk/LE) assist to perform with bed rail support    Transfers Overall transfer level: Needs assistance Equipment used: Rolling walker (2 wheeled);Ambulation equipment used Transfers: Sit to/from [facility] Sit to  Stand: Mod assist;+2 physical assistance;+2 safety/equipment;From elevated surface Stand pivot transfers: +2 physical assistance;Mod assist       General transfer comment: from chair>RW with modA to stabilize and from elevated bed> stedy with min/modA +2    Balance Overall balance assessment: Needs assistance Sitting-balance support: Feet supported;Single extremity supported Sitting balance-Leahy Scale: Fair Sitting balance - Comments: able to sit with min guard to Supervision ~5 mins pre/post transfer but prefers 1-2 UE support   Standing balance support: Bilateral upper extremity supported Standing balance-Leahy Scale: Poor Standing balance comment: reliant on +2 for safety, needing minA to modA for standing balance; fear of falls verbalized                           ADL either performed or assessed with clinical judgement   ADL Overall ADL's : Needs assistance/impaired                         Toilet Transfer: +2 for physical assistance;Stand-pivot;BSC;RW;Moderate assistance             General ADL Comments: Sat  EOB a few minutes. Utilized Stedy for first time standing EOB in several days. From recliner pt able to stand and pivot to EOB (simulating BSC transfer). Extra time and effort. Encouragement needed.     Vision       Perception     Praxis      Cognition Arousal/Alertness: Awake/alert Behavior During Therapy: Anxious;Flat affect (self limiting) Overall Cognitive Status: No family/caregiver present to determine baseline cognitive functioning Area of Impairment: Attention;Memory;Following commands;Safety/judgement;Awareness;Problem solving  Current Attention Level: Sustained;Selective Memory: Decreased recall of precautions;Decreased short-term memory Following Commands: Follows one step commands with increased time;Follows one step commands consistently Safety/Judgement: Decreased awareness of deficits;Decreased  awareness of safety Awareness: Emergent Problem Solving: Slow processing;Decreased initiation;Difficulty sequencing;Requires verbal cues;Requires tactile cues General Comments: pt insistent that she willl be able to do everything she needs to do at home but anxious when staff prompting her to perform and needs increased time/max cues to perform all tasks        Exercises Exercises: Other exercises General Exercises - Lower Extremity Heel Slides: AAROM;5 reps;Both;Supine Other Exercises Other Exercises: brought HEP handout to room for pt to perform, encouraged 3x/day   Shoulder Instructions       General Comments See gait comments for VS, SpO2 >92% on 2L O2 [place] when good pleth reading able to be obtained    Pertinent Vitals/ Pain       Pain Assessment: Faces Faces Pain Scale: Hurts even more Pain Location: "muscle spasms" especially in RLE and low back Pain Descriptors / Indicators: Discomfort;Grimacing;Spasm;Moaning Pain Intervention(s): Monitored during session;Limited activity within patient's tolerance;Repositioned;Premedicated before session  Home Living                                          Prior Functioning/Environment              Frequency  Min 2X/week        Progress Toward Goals  OT Goals(current goals can now be found in the care plan section)  Progress towards OT goals: Progressing toward goals  Acute Rehab OT Goals Patient Stated Goal: to go home OT Goal Formulation: With patient Time For Goal Achievement: 01/25/21 Potential to Achieve Goals: Fair ADL Goals Pt Will Perform Grooming: with min guard assist;sitting Pt Will Perform Upper Body Bathing: with min guard assist;sitting Pt Will Perform Lower Body Bathing: with max assist;sitting/lateral leans;sit to/from stand Pt Will Transfer to Toilet: with max assist;stand pivot transfer;squat pivot transfer;ambulating;bedside commode  Plan Discharge plan remains appropriate;Frequency  remains appropriate    Co-evaluation    PT/OT/SLP Co-Evaluation/Treatment: Yes Reason for Co-Treatment: Complexity of the patient's impairments (multi-system involvement);Necessary to address cognition/behavior during functional activity;For patient/therapist safety;To address functional/ADL transfers PT goals addressed during session: Mobility/safety with mobility;Balance;Proper use of DME;Strengthening/ROM OT goals addressed during session: ADL's and self-care;Proper use of Adaptive equipment and DME;Strengthening/ROM      AM-PAC OT "6 Clicks" Daily Activity     Outcome Measure   Help from another person eating meals?: A Little Help from another person taking care of personal grooming?: A Little Help from another person toileting, which includes using toliet, bedpan, or urinal?: Total Help from another person bathing (including washing, rinsing, drying)?: Total Help from another person to put on and taking off regular upper body clothing?: A Lot Help from another person to put on and taking off regular lower body clothing?: Total 6 Click Score: 11    End of Session Equipment Utilized During Treatment: Oxygen  OT Visit Diagnosis: Unsteadiness on feet (R26.81);Other abnormalities of gait and mobility (R26.89);Muscle weakness (generalized) (M62.81)   Activity Tolerance Other (comment);Patient limited by fatigue;Patient limited by pain (self limiting)   Patient Left in bed;with call bell/phone within reach;with bed alarm set   Nurse Communication          Time: 1150-1230 OT Time Calculation (min): 40 min  Charges: OT General Charges $  OT Visit: 1 Visit OT Treatments $Self Care/Home Management : 8-22 mins  Tyrone Schimke, OT Acute Rehabilitation Services Pager: [phone] Office: [phone]   Hortencia Pilar 01/16/2021, 2:07 PM

## 2021-01-17 LAB — CBC
HCT: 28.2 % — ABNORMAL LOW (ref 36.0–46.0)
Hemoglobin: 8.4 g/dL — ABNORMAL LOW (ref 12.0–15.0)
MCH: 28.2 pg (ref 26.0–34.0)
MCHC: 29.8 g/dL — ABNORMAL LOW (ref 30.0–36.0)
MCV: 94.6 fL (ref 80.0–100.0)
Platelets: DECREASED 10*3/uL (ref 150–400)
RBC: 2.98 MIL/uL — ABNORMAL LOW (ref 3.87–5.11)
RDW: 17.3 % — ABNORMAL HIGH (ref 11.5–15.5)
WBC: 10 10*3/uL (ref 4.0–10.5)
nRBC: 0 % (ref 0.0–0.2)

## 2021-01-17 NOTE — Progress Notes (Signed)
Subjective: No overnight event. Pt seen and examined today at the bedside.  Pt states she still has pain in her back and percocet helps a little bit but pain comes back. She states she worked with PT but needed help with walking and couldn't sit on the chair. States she is afraid of going home as her daughter works and cannot help her much. She is also afraid of going to SNF because of her bad experience at [facility] center. Pt states she is open to hear about other options.  Objective:  Vital signs in last 24 hours: Vitals:   01/16/21 2117 01/17/21 0000 01/17/21 0052 01/17/21 0825  BP: (!) 133/53  (!) 114/53 (!) 129/56  Pulse: 93 92 86 86  Resp: 17 19 19  (!) 23  Temp: 98.7 F (37.1 C)  98.9 F (37.2 C) 98.6 F (37 C)  TempSrc: Oral  Oral Oral  SpO2: 94% 97% 99% 95%  Weight:      Height:         Physical Exam Vitals and nursing note reviewed.  Constitutional: appears comfortable Head: Normocephalic and atraumatic Cardiovascular: regular rate and rhythm, normal heart sounds Pulmonary: on nasal canula 2L, ascultation limited by habitus and positioning but clear bilaterally Skin: warm and dry, lower extremities with Unna wraps. Neurological: alert, oriented Psych- Mood and Affect - Normal   Assessment/Plan:  [name] is a 70 y/o F with hx of chronic respiratory failure on 3-4L chronically, morbid obesity, HFpEF, chronic ITP, chronic leg wounds who presents with rib fractures after mechanical fall. Acute on chronic hypercapneic and hypoxic respiratory failure, also found to have hemothorax. Respiratory status improved after placing chest tube. Chest tube removed 01/14/21.   Principal Problem:   Pneumothorax Active Problems:   Pressure injury of skin   Multiple closed fractures of ribs of right side   Fall against object   Acute respiratory distress   Rib fractures   Hemothorax on right   Pleural effusion   Lesion of bone of thoracic spine   Chest tube in place  Acute  on chronic hypercapneic and hypoxic respiratory failure, on 3-4L chronically Ribs fracture due to mechanical fall Traumatic hemothorax in the setting of chronic ITP Symptomatic anemia Rib fractures and hemothorax after mechanical fall. Hgb stabilized. IR removed chest tube. Saturating well on 2 L.  Pt did better with PT/OT yesterday but couldn't sit because of pain. Hb stable at 8.4. [name] continues to exhibit signs of hypercapnia associated with chronic respiratory failure secondary to emphysema/COPD.  All alternate devices [phone] and [id number]) have been proven ineffective to provide essential volume control necessary to maintain acceptable CO2 levels (55.5 on 01/08/21). Interruption or failure to provide NIV would quickly lead to exacerbation of the patient's condition, hospital admission (this is her secondary admission with respiratory failure this year), and likely harm to the patient. Continued use is preferred. Pt wants to hear options for SNF.  - nasal canula to maintain sats 88-92% - BiPAP at night - daily CBC - Multimodal pain control with Percocet 10-325 every 4 hours for pain PRN. lidoderm patch (Q12hr) and Tylenol 1000 mg Q8H  --Continue Baclofen 5 mg TID PRN for muscle spasms. - Pulm toilet, incentive spirometer when off BIPAP. --Continue OT/PT.    T12 destructive change suspicious for tumor MRI with change at T12 concerning for malignant or infectious etiology.  No clear primary at this point.  Patient refused for biopsy at this time.  As Per neurosurgery, it can  be done outpatient.   - outpatient Biopsy of the t12 lesion, mammogram, colonoscopy  Chronic HFpEF OSA BP stable 129/56 mmHg. Doesn't look volume overloaded.  - Continue lasix 40 mg - O2 as above - BiPAP at night   Chronic Venous Stasis -- Unna wrappings   SNF vs Home  - Talked to Pt about going to SNF as recommended by PT and OT. States she is afraid of going home as her daughter works and cannot help her  much. She is also afraid of going to SNF because of her bad experience at [facility] center. Previously Pt refused to go to SNF but today she states that she is open to hear about SNF options.   - CSW and TOC informed about that and they will look for SNF options for her.  VTE prophylaxis: holding off with her thrombocytopenia and hemothorax. Will recheck CBC tomorrow and start then. Prior to Admission Living Arrangement: home Anticipated Discharge Location: Possibly SNF Barriers to Discharge: pain control and placement to SNF.   Dispo: anticipate discharge in 1-2 days  [place] [facility] PGY-1  Pager : [phone] After 5 pm on weekdays and after 1 pm on weekends call on Call Pager: [phone]   01/17/2021, 1:20 PM

## 2021-01-17 NOTE — NC FL2 (Signed)
[place] MEDICAID FL2 LEVEL OF CARE SCREENING TOOL     IDENTIFICATION  Patient Name: [name] Birthdate: 13-Nov-1950 Sex: female Admission Date (Current Location): 01/02/2021  [facility] and [place] Number:  Herbalist and Address:  [facility] [place]. [facility], [place] [address], [place], [place] [address]      Provider Number: [id number]  Attending Physician Name and Address:  Lucious Groves, DO  Relative Name and Phone Number:       Current Level of Care: Hospital Recommended Level of Care: [place] Prior Approval Number:    Date Approved/Denied:   PASRR Number: [id number]  Discharge Plan: SNF    Current Diagnoses: Patient Active Problem List   Diagnosis Date Noted  . Chest tube in place   . Lesion of bone of thoracic spine   . Pleural effusion   . Hemothorax on right   . Fall against object 01/04/2021  . Acute respiratory distress   . Rib fractures   . Pneumothorax   . Multiple closed fractures of ribs of right side 01/02/2021  . Sepsis ([place]) 09/26/2020  . Pneumonia due to COVID-19 virus 07/04/2020  . Respiratory failure ([place]) 06/30/2020  . Respiratory failure with hypoxia ([place]) 06/29/2020  . Shock ([place]) 05/14/2020  . Chronic, continuous use of opioids 05/14/2020  . Opioid overdose ([place]) 05/14/2020  . Acute on chronic respiratory failure with hypoxia and hypercapnia ([place]) 05/14/2020  . Pressure injury of skin 04/22/2020  . Sepsis due to cellulitis ([place]) 04/18/2020  . Venous stasis dermatitis of both lower extremities 02/25/2019  . Encounter for preventive care 10/20/2018  . Chronic respiratory failure with hypoxia and hypercapnia ([place]) 10/06/2018  . Polycythemia, secondary 10/05/2018  . Obesity hypoventilation syndrome ([place]) 10/04/2018  . AKI (acute kidney injury) ([place])   . BMI 40.0-44.9, adult ([place])   . Bilateral lower extremity edema   . Venous stasis dermatitis 09/30/2018  . Wound of lower extremity, bilateral  09/30/2018  . Chronic heart failure with preserved ejection fraction with moderate RV failure 06/09/2017  . GERD (gastroesophageal reflux disease) 11/13/2014  . Opiate-induced constipation 11/13/2014  . History of tobacco abuse 03/10/2014  . Chronic back pain secondary to traumatic L1 fracture 09/29/2013  . Chronic idiopathic thrombocytopenia ([place]) 08/29/2013  . HTN (hypertension)     Orientation RESPIRATION BLADDER Height & Weight     Self,Time,Situation,Place  Normal,Other (Comment) (Bipap @ night) External catheter,Continent Weight: 190 lb 11.2 oz (86.5 kg) Height:  5' (152.4 cm)  BEHAVIORAL SYMPTOMS/MOOD NEUROLOGICAL BOWEL NUTRITION STATUS      Incontinent (rectal tube pouch) Diet (please see dishcarge summary)  AMBULATORY STATUS COMMUNICATION OF NEEDS Skin     Verbally Surgical wounds (pressure injury buttocks medial Stage 2)                       Personal Care Assistance Level of Assistance  Bathing,Feeding,Dressing Bathing Assistance: Limited assistance Feeding assistance: Independent Dressing Assistance: Limited assistance     Functional Limitations Info  Sight,Hearing,Speech Sight Info: Adequate Hearing Info: Adequate Speech Info: Adequate    SPECIAL CARE FACTORS FREQUENCY  PT (By licensed PT),OT (By licensed OT)     PT Frequency: 5x per week OT Frequency: 5x per week            Contractures Contractures Info: Not present    Additional Factors Info  Code Status,Allergies (bipap @ night Adult full face mask small -set rate 10, respiratory rate 30, EPAP 5) Code  Status Info: Full Allergies Info: NKA           Current Medications (01/17/2021):  This is the current hospital active medication list Current Facility-Administered Medications  Medication Dose Route Frequency Provider Last Rate Last Admin  . 0.9 %  sodium chloride infusion  250 mL Intravenous PRN Stark Klein, MD      . acetaminophen (TYLENOL) tablet 1,000 mg  1,000 mg Oral Q8H Jose Persia, MD   1,000 mg at 01/17/21 1322  . baclofen (LIORESAL) tablet 5 mg  5 mg Oral TID PRN Axel Filler, MD   5 mg at 01/17/21 1322  . furosemide (LASIX) tablet 40 mg  40 mg Oral Daily Andrew Au, MD   40 mg at 01/17/21 1004  . ipratropium-albuterol (DUONEB) 0.5-2.5 (3) MG/3ML nebulizer solution 3 mL  3 mL Nebulization Q6H PRN Barkley Boards R, PA-C   3 mL at 01/04/21 1032  . lidocaine (LIDODERM) 5 % 1 patch  1 patch Transdermal Q24H Norm Parcel, PA-C   1 patch at 01/17/21 1005  . ondansetron (ZOFRAN-ODT) disintegrating tablet 4 mg  4 mg Oral Q6H PRN Stark Klein, MD       Or  . ondansetron (ZOFRAN) injection 4 mg  4 mg Intravenous Q6H PRN Stark Klein, MD   4 mg at 01/04/21 1020  . oxyCODONE-acetaminophen (PERCOCET/ROXICET) 5-325 MG per tablet 1 tablet  1 tablet Oral Q4H PRN Iona Beard, MD   1 tablet at 01/17/21 1322   And  . oxyCODONE (Oxy IR/ROXICODONE) immediate release tablet 5 mg  5 mg Oral Q4H PRN Iona Beard, MD   5 mg at 01/17/21 1005  . sodium chloride flush (NS) 0.9 % injection 3 mL  3 mL Intravenous Q12H Stark Klein, MD   3 mL at 01/17/21 1010  . sodium chloride flush (NS) 0.9 % injection 3 mL  3 mL Intravenous PRN Stark Klein, MD         Discharge Medications: Please see discharge summary for a list of discharge medications.  Relevant Imaging Results:  Relevant Lab Results:   Additional Information SSSn [id number] patient will need  bipap  Vinie Sill, LCSW

## 2021-01-17 NOTE — Progress Notes (Signed)
Noted referral for SNF placement, pt has been adamantly refusing, CM went to speak with her and she is now agreeable to a SNF search to see what bed offers she may have. She states she does not want [facility], but agreeable to search in [facility] notified and will start bed search.

## 2021-01-17 NOTE — Progress Notes (Signed)
Physical Therapy Treatment Patient Details Name: [name] MRN: [id number] DOB: 24-Aug-1951 Today's Date: 01/17/2021    History of Present Illness Pt adm 4/20 with rib fractures after mechanical fall. Acute on chronic hypercapneic and hypoxic respiratory failure, also found to have hemothorax. Had thoracentesis with removal of 600cc of frank blood on 4/23 & chest tube placed 4/26.  Chest tube removed 5/2. PMH - chronic respiratory failure on 3-4L chronically, morbid obesity, HFpEF, chronic ITP, chronic leg wounds.    PT Comments    Pt received in supine, agreeable to therapy session and with good participation and tolerance for supine BLE exercises and bed mobility/transfer training. Focus on progressing seated tolerance/balance and lateral scoot transfers, pt continues to need mod/maxA for seated scooting with bed pad assist but tolerated seated posture for increased length of time this date. Pt also performed supine BLE exercises with improved tolerance this date but continues to need active assist for straight leg raises. Pt continues to benefit from PT services to progress toward functional mobility goals. Continue to recommend short term low intensity post-acute rehab upon discharge, pt reporting she is now agreeable to discharge recommendation.  Follow Up Recommendations  SNF;Supervision/Assistance - 24 hour;Supervision for mobility/OOB (HHPT if pt refuses SNF)     Equipment Recommendations  Other (comment);None recommended by PT (TBD, plan for SNF)    Recommendations for Other Services       Precautions / Restrictions Precautions Precautions: Fall Precaution Comments: rib fxs, flexiseal, O2 dependent Other Brace: on 3L O2 [place] at home Restrictions Weight Bearing Restrictions: No    Mobility  Bed Mobility Overal bed mobility: Needs Assistance Bed Mobility: Rolling;Sidelying to Sit;Sit to Sidelying Rolling: Mod assist Sidelying to sit: Max assist;HOB elevated     Sit to  sidelying: Mod assist General bed mobility comments: cues for log rolling and some hand over hand assist needed, pt initially hesitant to attempt but when broken down into component parts pt agreeable to attempt.    Transfers Overall transfer level: Needs assistance   Transfers: Lateral/Scoot Transfers          Lateral/Scoot Transfers: Max assist General transfer comment: from EOB, max cues for self-assist for scooting and max A wtih transfer pad, to simulate slide board transfers  Ambulation/Gait             General Gait Details: deferred due to pt significant fatigue after seated strengthening activities/scooting   Stairs             Wheelchair Mobility    Modified Rankin (Stroke Patients Only)       Balance Overall balance assessment: Needs assistance Sitting-balance support: Feet supported;Single extremity supported Sitting balance-Leahy Scale: Fair Sitting balance - Comments: able to sit with min guard to Supervision and prefers 1-2 UE support; able to sit ~8 mins total                                    Cognition Arousal/Alertness: Awake/alert Behavior During Therapy: Anxious Overall Cognitive Status: No family/caregiver present to determine baseline cognitive functioning Area of Impairment: Attention;Memory;Safety/judgement;Awareness;Problem solving                   Current Attention Level: Sustained;Selective Memory: Decreased recall of precautions;Decreased short-term memory Following Commands: Follows multi-step commands consistently Safety/Judgement: Decreased awareness of deficits;Decreased awareness of safety Awareness: Emergent Problem Solving: Slow processing;Decreased initiation;Difficulty sequencing;Requires verbal cues;Requires tactile cues General Comments: pt  states "I don't want to sit up" initially and verbalizes fear of falls but agreeable to progression of mobility from bed-level with increased  time/encouragement.      Exercises General Exercises - Lower Extremity Ankle Circles/Pumps: AROM;Both;20 reps;Supine Gluteal Sets: AROM;Both;10 reps;Supine Short Arc Quad: AROM;Both;10 reps;Supine Long Arc Quad: AROM;Both;10 reps;Seated Heel Slides: AAROM;Both;Supine;10 reps Hip ABduction/ADduction: AAROM;Both;10 reps;Supine Straight Leg Raises: AAROM;Both;10 reps;Supine Other Exercises Other Exercises: IS x10 reps max inspired volume was 455mL, encouraged 10x/hour    General Comments        Pertinent Vitals/Pain Pain Assessment: Faces Faces Pain Scale: Hurts even more Pain Location: ribs and back discomfort Pain Descriptors / Indicators: Discomfort;Grimacing;Spasm;Moaning Pain Intervention(s): Limited activity within patient's tolerance;Monitored during session;Repositioned;Other (comment) (pillow behind L hip to offload)    Home Living                      Prior Function            PT Goals (current goals can now be found in the care plan section) Acute Rehab PT Goals Patient Stated Goal: to go to rehab and get stronger, then home. PT Goal Formulation: With patient Time For Goal Achievement: 01/26/21 Potential to Achieve Goals: Good Progress towards PT goals: Progressing toward goals    Frequency    Min 3X/week      PT Plan Current plan remains appropriate    Co-evaluation              AM-PAC PT "6 Clicks" Mobility   Outcome Measure  Help needed turning from your back to your side while in a flat bed without using bedrails?: A Lot Help needed moving from lying on your back to sitting on the side of a flat bed without using bedrails?: A Lot Help needed moving to and from a bed to a chair (including a wheelchair)?: A Lot Help needed standing up from a chair using your arms (e.g., wheelchair or bedside chair)?: A Lot Help needed to walk in hospital room?: Total Help needed climbing 3-5 steps with a railing? : Total 6 Click Score: 10    End  of Session Equipment Utilized During Treatment: Oxygen Activity Tolerance: Patient tolerated treatment well;Patient limited by pain Patient left: in bed;with call bell/phone within reach;with bed alarm set (pillow behind L hip to offload, heels floated, HOB raised to 25 deg) Nurse Communication: Mobility status PT Visit Diagnosis: Muscle weakness (generalized) (M62.81);Difficulty in walking, not elsewhere classified (R26.2);Pain;History of falling (Z91.81) Pain - Right/Left:  (both) Pain - part of body:  (ribs, LBP)     Time: 3300-7622 PT Time Calculation (min) (ACUTE ONLY): 36 min  Charges:  $Therapeutic Exercise: 8-22 mins $Therapeutic Activity: 8-22 mins                     Jerome Viglione P., PTA Acute Rehabilitation Services Pager: [phone] Office: [place] 01/17/2021, 5:51 PM

## 2021-01-17 NOTE — Progress Notes (Signed)
Occupational Therapy Treatment Patient Details Name: XYLIA SCHERGER MRN: [id number] DOB: 01-07-1951 Today's Date: 01/17/2021    History of present illness Pt adm 4/20 with rib fractures after mechanical fall. Acute on chronic hypercapneic and hypoxic respiratory failure, also found to have hemothorax. Had thoracentesis with removal of 600cc of frank blood on 4/23 & chest tube placed 4/26.  Chest tube removed 5/2. PMH - chronic respiratory failure on 3-4L chronically, morbid obesity, HFpEF, chronic ITP, chronic leg wounds.   OT comments  Pt. Refused OOB or EOB today. Pt. Refused to preform ADLs. Pt. Ed on b UE HEP. Pt. Was able to return demo and states she will continue to preform at home. Pt. Ed to sit up multiple times a day to increase strength and endurance.   Follow Up Recommendations  SNF;Supervision/Assistance - 24 hour;Other (comment)    Equipment Recommendations  Tub/shower bench    Recommendations for Other Services      Precautions / Restrictions Precautions Precautions: Fall Precaution Comments: rib fxs, flexiseal, O2 dependent Other Brace: on 3L O2 [place] at home Restrictions Weight Bearing Restrictions: No       Mobility Bed Mobility               General bed mobility comments: Pt. refused to perform    Transfers                 General transfer comment: refused to sit eob or get out of bed.    Balance                                           ADL either performed or assessed with clinical judgement   ADL                                         General ADL Comments: Pt. refused to preform     Vision       Perception     Praxis      Cognition Arousal/Alertness: Awake/alert Behavior During Therapy: Anxious Overall Cognitive Status: No family/caregiver present to determine baseline cognitive functioning                         Following Commands: Follows multi-step commands consistently                 Exercises Exercises: General Upper Extremity General Exercises - Upper Extremity Shoulder Flexion: AROM;Both;20 reps;Supine Shoulder ABduction: AROM;Both;20 reps Shoulder ADduction: AROM;Both;20 reps Shoulder Horizontal ABduction: AROM;Both;20 reps Shoulder Horizontal ADduction: AROM;20 reps;Both Elbow Flexion: AROM;Both;20 reps Elbow Extension: AROM;Both;20 reps   Shoulder Instructions       General Comments      Pertinent Vitals/ Pain       Pain Assessment: 0-10 Pain Score: 6  Pain Location: ribs Pain Descriptors / Indicators: Discomfort;Grimacing;Spasm;Moaning Pain Intervention(s): Premedicated before session  Home Living                                          Prior Functioning/Environment              Frequency  Min 2X/week        Progress  Toward Goals  OT Goals(current goals can now be found in the care plan section)  Progress towards OT goals: Progressing toward goals  Acute Rehab OT Goals Patient Stated Goal: to go home OT Goal Formulation: With patient Time For Goal Achievement: 01/25/21 Potential to Achieve Goals: Fair ADL Goals Pt Will Perform Grooming: with min guard assist;sitting Pt Will Perform Upper Body Bathing: with min guard assist;sitting Pt Will Perform Lower Body Bathing: with max assist;sitting/lateral leans;sit to/from stand Pt Will Transfer to Toilet: with max assist;stand pivot transfer;squat pivot transfer;ambulating;bedside commode  Plan Discharge plan remains appropriate;Frequency remains appropriate    Co-evaluation    PT/OT/SLP Co-Evaluation/Treatment: Yes            AM-PAC OT "6 Clicks" Daily Activity     Outcome Measure   Help from another person eating meals?: A Little Help from another person taking care of personal grooming?: A Little Help from another person toileting, which includes using toliet, bedpan, or urinal?: Total Help from another person bathing (including  washing, rinsing, drying)?: Total Help from another person to put on and taking off regular upper body clothing?: A Lot Help from another person to put on and taking off regular lower body clothing?: Total 6 Click Score: 11    End of Session Equipment Utilized During Treatment: Oxygen  OT Visit Diagnosis: Unsteadiness on feet (R26.81);Other abnormalities of gait and mobility (R26.89);Muscle weakness (generalized) (M62.81)   Activity Tolerance  (Pt. self limiting states she does not want to get oob secondary to periwick. ed pt. that it can be replaced but still ddi not want eob or oob.)   Patient Left in bed;with call bell/phone within reach;with bed alarm set   Nurse Communication          Time: [phone] OT Time Calculation (min): 23 min  Charges: OT General Charges $OT Visit: 1 Visit OT Treatments $Therapeutic Exercise: 23-37 mins  Reece Packer OT/L   Mckynleigh Mussell 01/17/2021, 9:17 AM

## 2021-01-17 NOTE — TOC Progression Note (Signed)
Transition of Care [facility]) - Progression Note    Patient Details  Name: [name] MRN: [id number] Date of Birth: 1951-05-07  Transition of Care [facility]) CM/SW [place], [place] Phone Number: 01/17/2021, 3:03 PM  Clinical Narrative:     CSW received notice form RNCM, patient agreeable to short term rehab at [facility]. CSW was informed by RN to contact the patient's daughter.  CSW called patient's daughter,Savanna @ [phone]- unable to leave voice message- mail box was full.  CSW will provide bed offers once available. CSW will continue to follow and assist with discharge planning   Thurmond Butts, MSW, LCSW Clinical Social Worker   Expected Discharge Plan: Skilled Nursing Facility Barriers to Discharge: Continued Medical Work up  Expected Discharge Plan and Services Expected Discharge Plan: [place]   Discharge Planning Services: CM Consult Post Acute Care Choice: Home Health,Resumption of Svcs/PTA Provider Living arrangements for the past 2 months: Single Family Home                 DME Arranged: NIV DME Agency: [employer] Date DME Agency Contacted: 01/11/21 Time DME Agency Contacted: [phone]   [place]: [place] [facility]: [facility] [employer] [facility] [employer]         Social Determinants of Health ([place]) Interventions    Readmission Risk Interventions Readmission Risk Prevention Plan 09/30/2020  Transportation Screening Complete  Medication Review Press photographer) Complete  PCP or Specialist appointment within 3-5 days of discharge Complete  HRI or [place] Complete  SW Recovery Care/Counseling Consult Complete  [place] Patient Refused  Some recent data might be hidden

## 2021-01-18 LAB — CBC
HCT: 27.7 % — ABNORMAL LOW (ref 36.0–46.0)
Hemoglobin: 8.4 g/dL — ABNORMAL LOW (ref 12.0–15.0)
MCH: 28.6 pg (ref 26.0–34.0)
MCHC: 30.3 g/dL (ref 30.0–36.0)
MCV: 94.2 fL (ref 80.0–100.0)
Platelets: 100 10*3/uL — ABNORMAL LOW (ref 150–400)
RBC: 2.94 MIL/uL — ABNORMAL LOW (ref 3.87–5.11)
RDW: 17.2 % — ABNORMAL HIGH (ref 11.5–15.5)
WBC: 9.6 10*3/uL (ref 4.0–10.5)
nRBC: 0 % (ref 0.0–0.2)

## 2021-01-18 MED ORDER — SIMETHICONE 80 MG PO CHEW
80.0000 mg | CHEWABLE_TABLET | Freq: Every day | ORAL | Status: DC | PRN
  Administered 2021-01-18 – 2021-01-20 (×3): 80 mg via ORAL
  Filled 2021-01-18 (×3): qty 1

## 2021-01-18 NOTE — Progress Notes (Signed)
Subjective: No overnight event. Pt seen and examined today at the bedside.  Patient states she is feeling better.  She denies shortness of breath and chest pain.  She states her rib pain is better but she has some muscle spasm.  She worked with OT and PT yesterday but was not able to sit.  She did some supine BLE exercises.  Denies any complaints. Medically stable pending SNF bed availability.  Objective:  Vital signs in last 24 hours: Vitals:   01/17/21 2332 01/17/21 2341 01/18/21 0423 01/18/21 0756  BP:  (!) 112/59 96/62 (!) 131/59  Pulse: 85 84 78 93  Resp: 20 16 19 18   Temp:  98.6 F (37 C) 98.7 F (37.1 C) 98.4 F (36.9 C)  TempSrc:  Oral Oral Oral  SpO2: 98% 97% 100% 96%  Weight:      Height:         Physical Exam Vitals and nursing note reviewed.  Constitutional: appears comfortable Head: Normocephalic and atraumatic Cardiovascular: regular rate and rhythm, normal heart sounds Pulmonary: on nasal canula 2L, ascultation limited by habitus and positioning but clear bilaterally Skin: warm and dry, lower extremities with Unna wraps. Neurological: alert, oriented Psych- Mood and Affect - Normal   Assessment/Plan:  [name] is a 70 y/o F with hx of chronic respiratory failure on 3-4L chronically, morbid obesity, HFpEF, chronic ITP, chronic leg wounds who presents with rib fractures after mechanical fall. Acute on chronic hypercapneic and hypoxic respiratory failure, also found to have hemothorax. Respiratory status improved after placing chest tube. Chest tube removed 01/14/21. Now waiting for SNF.    Principal Problem:   Pneumothorax Active Problems:   Pressure injury of skin   Multiple closed fractures of ribs of right side   Fall against object   Acute respiratory distress   Rib fractures   Hemothorax on right   Pleural effusion   Lesion of bone of thoracic spine   Chest tube in place  Acute on chronic hypercapneic and hypoxic respiratory failure, on 3-4L  chronically Ribs fracture due to mechanical fall Traumatic hemothorax in the setting of chronic ITP Symptomatic anemia Rib fractures and hemothorax after mechanical fall. Hgb stabilized. IR removed chest tube. Saturating well on 2 L.  Hb stable at 8.4. Pt agreed to go to SNF. CSW and TOC informed. CSW will provide bed offers once available.  Patient feeling better today states her pain is better.  Satting well at 2 L of oxygen.  Denies any complaints. Hb stable at 8.4. Platelets 100.  Medically stable pending SNF bed availability. - nasal canula to maintain sats 88-92% - Continue BiPAP at night - CBC as needed - Multimodal pain control with Percocet 10-325 every 4 hours for pain PRN. lidoderm patch (Q12hr) and Tylenol 1000 mg Q8H  --Continue Baclofen 5 mg TID PRN for muscle spasms. - Pulm toilet, incentive spirometer when off BIPAP. --Continue OT/PT.  --Discontinue cardiac monitoring and pulse oximetry.  --CSW and TOC to continue looking for SNF placement.   T12 destructive change suspicious for tumor MRI with change at T12 concerning for malignant or infectious etiology.  No clear primary at this point.  Patient refused for biopsy at this time.  As Per neurosurgery, it can be done outpatient.   - outpatient Biopsy of the t12 lesion, mammogram, colonoscopy  Chronic HFpEF OSA BP soft early morning. Now stable at 131/59 mmHg - Continue lasix 40 mg - O2 as above - Continue BiPAP at night.  Chronic Venous Stasis -- Unna wrappings.   VTE prophylaxis: SCD's Prior to Admission Living Arrangement: home Anticipated Discharge Location: SNF Barriers to Discharge: Medically stable pending SNF bed availability.  Dispo: anticipate discharge in 1-2 days  [place] [facility] PGY-1  Pager : [phone] After 5 pm on weekdays and after 1 pm on weekends call on Call Pager: [phone]   01/18/2021, 10:31 AM

## 2021-01-18 NOTE — Care Management Important Message (Signed)
Important Message  Patient Details  Name: [name] MRN: [id number] Date of Birth: Jul 29, 1951   Medicare Important Message Given:  Yes     Shelda Altes 01/18/2021, 9:41 AM

## 2021-01-18 NOTE — Plan of Care (Signed)

## 2021-01-18 NOTE — TOC Progression Note (Signed)
Transition of Care [facility]) - Progression Note    Patient Details  Name: [name] MRN: [id number] Date of Birth: April 10, 1951  Transition of Care [facility]) CM/SW [place], [place] Phone Number: 01/18/2021, 5:10 PM  Clinical Narrative:     Spoke with patient's daughter,Savanna, - informed of bed offers- she requested to send referral to [facility] in [place] sent referral and message to Admission Coordinator to follow up.  TOC will continue to follow and assist with discharge planning.  Thurmond Butts, MSW, LCSW Clinical Social Worker    Expected Discharge Plan: Skilled Nursing Facility Barriers to Discharge: Continued Medical Work up  Expected Discharge Plan and Services Expected Discharge Plan: [place]   Discharge Planning Services: CM Consult Post Acute Care Choice: Home Health,Resumption of Svcs/PTA Provider Living arrangements for the past 2 months: Single Family Home                 DME Arranged: NIV DME Agency: [employer] Date DME Agency Contacted: 01/11/21 Time DME Agency Contacted: [phone]   [place]: [place] [facility]: [facility] [employer]         Social Determinants of Health ([place]) Interventions    Readmission Risk Interventions Readmission Risk Prevention Plan 09/30/2020  Transportation Screening Complete  Medication Review Press photographer) Complete  PCP or Specialist appointment within 3-5 days of discharge Complete  HRI or [place] Complete  SW Recovery Care/Counseling Consult Complete  [place] Patient Refused  Some recent data might be hidden

## 2021-01-19 MED ORDER — OXYCODONE HCL 5 MG PO TABS
5.0000 mg | ORAL_TABLET | Freq: Four times a day (QID) | ORAL | Status: DC | PRN
  Administered 2021-01-19 – 2021-01-21 (×7): 5 mg via ORAL
  Filled 2021-01-19 (×7): qty 1

## 2021-01-19 MED ORDER — ACETAMINOPHEN 500 MG PO TABS
500.0000 mg | ORAL_TABLET | Freq: Three times a day (TID) | ORAL | Status: DC
  Administered 2021-01-19 – 2021-01-21 (×7): 500 mg via ORAL
  Filled 2021-01-19 (×5): qty 1

## 2021-01-19 MED ORDER — OXYCODONE-ACETAMINOPHEN 5-325 MG PO TABS
1.0000 | ORAL_TABLET | Freq: Four times a day (QID) | ORAL | Status: DC | PRN
  Administered 2021-01-19 – 2021-01-21 (×7): 1 via ORAL
  Filled 2021-01-19 (×7): qty 1

## 2021-01-19 NOTE — TOC Progression Note (Signed)
Transition of Care [facility]) - Progression Note    Patient Details  Name: [name] MRN: [id number] Date of Birth: 04-07-1951  Transition of Care [facility]) CM/SW [place], [place] Phone Number: [phone] 01/19/2021, 3:50 PM  Clinical Narrative:     Patient received a bed offer from [facility] which was pt's preference. CSW attempted to call Estill Bamberg at [facility] however [place] does not think that they have admission staff on the weekends. CSW left message.  CSW notified patient and pt's daughter [name] about [facility]anda Busing bed offer.  CSW called [facility] and was informed that authorization was already started and that it was approved.CSW will need to call back and provide facility once bed at [facility] has been confirmed.  Reference #- [id number]. Auth dates: 5/7-5/10.  TOC team will continue to assist with discharge planning needs.   Expected Discharge Plan: [place] Barriers to Discharge: Continued Medical Work up  Expected Discharge Plan and Services Expected Discharge Plan: [place]   Discharge Planning Services: CM Consult Post Acute Care Choice: Home Health,Resumption of Svcs/PTA Provider Living arrangements for the past 2 months: Single Family Home                 DME Arranged: NIV DME Agency: [employer] Date DME Agency Contacted: 01/11/21 Time DME Agency Contacted: [phone]   [place]: [place] [facility]: [facility] [employer] [facility] [employer]         Social Determinants of Health ([place]) Interventions    Readmission Risk Interventions Readmission Risk Prevention Plan 09/30/2020  Transportation Screening Complete  Medication Review Press photographer) Complete  PCP or Specialist appointment within 3-5 days of discharge Complete  HRI or [place] Complete  SW Recovery Care/Counseling Consult Complete  [place]  Patient Refused  Some recent data might be hidden

## 2021-01-19 NOTE — Progress Notes (Signed)
     Subjective: pain continues to be well controlled, hoping to go to [facility]  Objective:  Vital signs in last 24 hours: Vitals:   01/18/21 1938 01/18/21 2336 01/19/21 0319 01/19/21 0349  BP: (!) 134/50 (!) 131/58  (!) 128/59  Pulse: 100 93 72 74  Resp: 18 18 11 18   Temp: 98.8 F (37.1 C) 98.6 F (37 C)  98.7 F (37.1 C)  TempSrc: Oral Oral  Oral  SpO2: 97% 96% 100% 100%  Weight:      Height:      General: resting in bed HEENT:  no scleral icterus Cardiac: RRR, no rubs, murmurs or gallops Pulm: clear to auscultation bilaterally Abd: soft, nontender, nondistended, BS present Ext: warm and well perfused, no pedal edema   Assessment/Plan:   Hemothorax on right   Multiple closed fractures of ribs of right side   Fall against object - continue to do well without evidence of recurrence.  Use of pain kills is decreasing will decrease frequency to q6prn.  Also reduce dose of acetaminophen to 500mg  q6prn to reduce chance of OD. -continue baclofen for spasms. - plan to discharge to SNF when bed available due to deconditioning after >2 week hospitalization      Lesion of bone of thoracic spine -needs IR biopsy however requested to wait until she gets her energy up out of hospital   Chronic HFpEF -stable, continue oral lasix 40mg  daily   Dispo: Awaiting insurance authorization for SNF, otherwise stable for discharge.  Lucious Groves, DO 01/19/2021, 7:53 AM Pager: [phone]

## 2021-01-20 NOTE — TOC Progression Note (Signed)
Transition of Care [facility]) - Progression Note    Patient Details  Name: [name] MRN: [id number] Date of Birth: 10-21-1950  Transition of Care [facility]) CM/SW [place], [place] Phone Number: 01/20/2021, 9:17 AM  Clinical Narrative:     Called and left voice message for Admissions Coordinator [place], MSW, LCSW Clinical Social Worker   Expected Discharge Plan: Skilled Nursing Facility Barriers to Discharge: Continued Medical Work up  Expected Discharge Plan and Services Expected Discharge Plan: [place]   Discharge Planning Services: CM Consult Post Acute Care Choice: Home Health,Resumption of Svcs/PTA Provider Living arrangements for the past 2 months: Single Family Home                 DME Arranged: NIV DME Agency: [employer] Date DME Agency Contacted: 01/11/21 Time DME Agency Contacted: [phone]   [place]: RN,PT,OT,Disease Management,Nurse's Aide [place] Agency: [facility] [employer] [facility] [employer]         Social Determinants of Health ([place]) Interventions    Readmission Risk Interventions Readmission Risk Prevention Plan 09/30/2020  Transportation Screening Complete  Medication Review Press photographer) Complete  PCP or Specialist appointment within 3-5 days of discharge Complete  HRI or [place] Complete  SW Recovery Care/Counseling Consult Complete  [place] Patient Refused  Some recent data might be hidden

## 2021-01-20 NOTE — Progress Notes (Signed)
   Subjective: Pt took pain medication and feels better this AM. Prior to taking medication, pain was 8-9. Reports head feels "echoey" but often feels like this in the hospital. Denies chest pain, SOB, and headache. Discussed she will be going to [facility]. Objective:  Vital signs in last 24 hours: Vitals:   01/19/21 2139 01/19/21 2343 01/20/21 0002 01/20/21 0305  BP: (!) 131/55 (!) 130/56 (!) 130/56 (!) 107/51  Pulse: 70 66  68  Resp: 16 18 (!) 29 18  Temp: 98.7 F (37.1 C) 97.9 F (36.6 C)  98.1 F (36.7 C)  TempSrc: Oral Oral  Oral  SpO2: 97% 95%  99%  Weight:      Height:       General: Resting in bed comfortably. HEENT: No icterus. Cardiac: RRR, no M/R/G. Pulm: Clear to auscultation bilaterally Abd: Soft, nontender, non-distended. Ext: No pedal edema.  Assessment/Plan: Ms. Puryear is a 70 y/o F with hx of chronic respiratory failure on 3-4L chronically, morbid obesity, HFpEF, chronic ITP who presented with rib fractures after mechanical fall, also found to have hemothorax. Chest tube removed 01/14/21. Now waiting for SNF.     Hemothorax on right   Multiple closed fractures of ribs of right side   Fall against object Pt is trying to tolerate pain and delay taking pain meds.  -Continue pain meds every 6 hours as needed. -- Baclofen for spasms. - May go to [facility] after confirmation.     Lesion of bone of thoracic spine -Needs IR biopsy but patient requested to do it outpatient.   Chronic HFpEF OSA -stable, continue oral lasix 40mg  daily -BiPAP at night.  Dispo: Medically stable for Discharge. Awaiting insurance authorization for SNF.  Armando Reichert, MD 01/20/2021, 6:31 AM Pager: [phone] After 5pm on weekdays and 1pm on weekends: On Call pager [phone]

## 2021-01-21 DIAGNOSIS — W19XXXA Unspecified fall, initial encounter: Secondary | ICD-10-CM | POA: Diagnosis not present

## 2021-01-21 DIAGNOSIS — J9621 Acute and chronic respiratory failure with hypoxia: Secondary | ICD-10-CM | POA: Diagnosis not present

## 2021-01-21 DIAGNOSIS — D693 Immune thrombocytopenic purpura: Secondary | ICD-10-CM | POA: Diagnosis not present

## 2021-01-21 DIAGNOSIS — R0781 Pleurodynia: Secondary | ICD-10-CM | POA: Diagnosis not present

## 2021-01-21 DIAGNOSIS — S270XXA Traumatic pneumothorax, initial encounter: Secondary | ICD-10-CM | POA: Diagnosis not present

## 2021-01-21 DIAGNOSIS — E662 Morbid (severe) obesity with alveolar hypoventilation: Secondary | ICD-10-CM | POA: Diagnosis not present

## 2021-01-21 DIAGNOSIS — G8929 Other chronic pain: Secondary | ICD-10-CM | POA: Diagnosis not present

## 2021-01-21 DIAGNOSIS — R600 Localized edema: Secondary | ICD-10-CM | POA: Diagnosis not present

## 2021-01-21 DIAGNOSIS — S2239XA Fracture of one rib, unspecified side, initial encounter for closed fracture: Secondary | ICD-10-CM | POA: Diagnosis not present

## 2021-01-21 DIAGNOSIS — R531 Weakness: Secondary | ICD-10-CM | POA: Diagnosis not present

## 2021-01-21 DIAGNOSIS — R609 Edema, unspecified: Secondary | ICD-10-CM | POA: Diagnosis not present

## 2021-01-21 DIAGNOSIS — S2231XA Fracture of one rib, right side, initial encounter for closed fracture: Secondary | ICD-10-CM | POA: Diagnosis not present

## 2021-01-21 DIAGNOSIS — M5459 Other low back pain: Secondary | ICD-10-CM | POA: Diagnosis not present

## 2021-01-21 DIAGNOSIS — I5033 Acute on chronic diastolic (congestive) heart failure: Secondary | ICD-10-CM | POA: Diagnosis not present

## 2021-01-21 DIAGNOSIS — J942 Hemothorax: Secondary | ICD-10-CM | POA: Diagnosis not present

## 2021-01-21 DIAGNOSIS — S270XXD Traumatic pneumothorax, subsequent encounter: Secondary | ICD-10-CM | POA: Diagnosis not present

## 2021-01-21 DIAGNOSIS — S2241XD Multiple fractures of ribs, right side, subsequent encounter for fracture with routine healing: Secondary | ICD-10-CM | POA: Diagnosis not present

## 2021-01-21 DIAGNOSIS — M2550 Pain in unspecified joint: Secondary | ICD-10-CM | POA: Diagnosis not present

## 2021-01-21 DIAGNOSIS — J449 Chronic obstructive pulmonary disease, unspecified: Secondary | ICD-10-CM | POA: Diagnosis not present

## 2021-01-21 DIAGNOSIS — I509 Heart failure, unspecified: Secondary | ICD-10-CM | POA: Diagnosis not present

## 2021-01-21 DIAGNOSIS — D6489 Other specified anemias: Secondary | ICD-10-CM | POA: Diagnosis not present

## 2021-01-21 DIAGNOSIS — G4733 Obstructive sleep apnea (adult) (pediatric): Secondary | ICD-10-CM | POA: Diagnosis not present

## 2021-01-21 DIAGNOSIS — D649 Anemia, unspecified: Secondary | ICD-10-CM | POA: Diagnosis not present

## 2021-01-21 DIAGNOSIS — Z7401 Bed confinement status: Secondary | ICD-10-CM | POA: Diagnosis not present

## 2021-01-21 DIAGNOSIS — I10 Essential (primary) hypertension: Secondary | ICD-10-CM | POA: Diagnosis not present

## 2021-01-21 DIAGNOSIS — J9622 Acute and chronic respiratory failure with hypercapnia: Secondary | ICD-10-CM | POA: Diagnosis not present

## 2021-01-21 DIAGNOSIS — J9620 Acute and chronic respiratory failure, unspecified whether with hypoxia or hypercapnia: Secondary | ICD-10-CM | POA: Diagnosis not present

## 2021-01-21 DIAGNOSIS — I5189 Other ill-defined heart diseases: Secondary | ICD-10-CM | POA: Diagnosis not present

## 2021-01-21 DIAGNOSIS — I872 Venous insufficiency (chronic) (peripheral): Secondary | ICD-10-CM | POA: Diagnosis not present

## 2021-01-21 LAB — RESP PANEL BY RT-PCR (FLU A&B, COVID) ARPGX2
Influenza A by PCR: NEGATIVE
Influenza B by PCR: NEGATIVE
SARS Coronavirus 2 by RT PCR: NEGATIVE

## 2021-01-21 MED ORDER — CARVEDILOL 3.125 MG PO TABS: 3.1250 mg | ORAL_TABLET | Freq: Two times a day (BID) | ORAL | 0 refills | Status: AC

## 2021-01-21 MED ORDER — FUROSEMIDE 40 MG PO TABS: 40.0000 mg | ORAL_TABLET | Freq: Every day | ORAL | 3 refills | Status: AC

## 2021-01-21 MED ORDER — FUROSEMIDE 80 MG PO TABS: 80.0000 mg | ORAL_TABLET | ORAL | 1 refills | Status: DC

## 2021-01-21 MED ORDER — OXYCODONE-ACETAMINOPHEN 5-325 MG PO TABS: 1.0000 | ORAL_TABLET | Freq: Four times a day (QID) | ORAL | 0 refills | Status: AC | PRN

## 2021-01-21 MED ORDER — LOPERAMIDE HCL 2 MG PO CAPS: 2.0000 mg | ORAL_CAPSULE | ORAL | 0 refills | Status: AC | PRN

## 2021-01-21 MED ORDER — CYANOCOBALAMIN 1000 MCG PO TABS: 1000.0000 ug | ORAL_TABLET | Freq: Every day | ORAL | 2 refills | Status: AC

## 2021-01-21 MED ORDER — OXYCODONE-ACETAMINOPHEN 5-325 MG PO TABS: 1.0000 | ORAL_TABLET | Freq: Four times a day (QID) | ORAL | 0 refills | Status: DC | PRN

## 2021-01-21 MED ORDER — BACLOFEN 5 MG PO TABS: 5.0000 mg | ORAL_TABLET | Freq: Three times a day (TID) | ORAL | 0 refills | Status: AC | PRN

## 2021-01-21 NOTE — Progress Notes (Incomplete)
   Subjective:   Objective:  Vital signs in last 24 hours: Vitals:   01/20/21 1200 01/20/21 1712 01/20/21 2200 01/21/21 0100  BP: 134/74 138/79 (!) 150/74   Pulse: 92 91 93 82  Resp: 20 18 16 17   Temp: 98.2 F (36.8 C) 98.5 F (36.9 C) 98.2 F (36.8 C)   TempSrc: Oral Oral Axillary   SpO2: 96% 95% 95%   Weight:      Height:       General: Resting in bed comfortably. HEENT: No icterus. Cardiac: RRR, no M/R/G. Pulm: Clear to auscultation bilaterally Abd: Soft, nontender, non-distended. Ext: No pedal edema.  Assessment/Plan: [name] is a 70 y/o F with hx of chronic respiratory failure on 3-4L chronically, morbid obesity, HFpEF, chronic ITP who presented with rib fractures after mechanical fall, also found to have hemothorax. Chest tube removed 01/14/21. Now waiting for SNF.     Hemothorax on right   Multiple closed fractures of ribs of right side   Fall against object Pt is trying to tolerate pain and delay taking pain meds.  -Continue pain meds every 6 hours as needed. -- Baclofen for spasms. - May go to [facility] after confirmation.     Lesion of bone of thoracic spine -Needs IR biopsy but patient requested to do it outpatient.   Chronic HFpEF OSA -stable, continue oral lasix 40mg  daily -BiPAP at night.  Dispo: Medically stable for Discharge. Awaiting insurance authorization for SNF.  Armando Reichert, MD 01/21/2021, 6:13 AM Pager: [phone] After 5pm on weekdays and 1pm on weekends: On Call pager [phone]

## 2021-01-21 NOTE — Progress Notes (Signed)
[facility] arrived to transport pt to facility. Pt in no distress. VSS

## 2021-01-21 NOTE — Plan of Care (Signed)

## 2021-01-21 NOTE — TOC Progression Note (Signed)
Transition of Care [facility]) - Progression Note    Patient Details  Name: [name] MRN: [id number] Date of Birth: 23-Aug-1951  Transition of Care [facility]) CM/SW [place], [place] Phone Number: 01/21/2021, 10:20 AM  Clinical Narrative:     Chanda Busing has confirmed bed offer and family has accepted. Insurance auth received # [id number] form 5/9-5/11  CSW updated Agricultural consultant- rapid covid test requested  Thurmond Butts, MSW, LCSW Clinical Social Worker   Expected Discharge Plan: Skilled Nursing Facility Barriers to Discharge: Continued Medical Work up  Expected Discharge Plan and Services Expected Discharge Plan: [place]   Discharge Planning Services: CM Consult Post Acute Care Choice: Home Health,Resumption of Svcs/PTA Provider Living arrangements for the past 2 months: Single Family Home                 DME Arranged: NIV DME Agency: [employer] Date DME Agency Contacted: 01/11/21 Time DME Agency Contacted: [phone]   [place]: [place] Agency: [facility] [facility] [employer]         Social Determinants of Health ([place]) Interventions    Readmission Risk Interventions Readmission Risk Prevention Plan 09/30/2020  Transportation Screening Complete  Medication Review Press photographer) Complete  PCP or Specialist appointment within 3-5 days of discharge Complete  HRI or [place] Complete  SW Recovery Care/Counseling Consult Complete  [place] Patient Refused  Some recent data might be hidden

## 2021-01-21 NOTE — TOC Transition Note (Signed)
Transition of Care [facility]) - CM/SW Discharge Note   Patient Details  Name: [name] MRN: [id number] Date of Birth: Sep 14, 1951  Transition of Care [facility]) CM/SW Contact:  Vinie Sill, LCSW Phone Number: 01/21/2021, 2:28 PM   Clinical Narrative:     Patient will Discharge to: [name]  Discharge Date: 01/21/2021 Family Notified: [name],daughter Transport CH:YIFO  Per MD patient is ready for discharge. RN, patient, and facility notified of discharge. Discharge Summary sent to facility. RN given number for [phone]. Ambulance transport requested for patient.   Clinical Social Worker signing off.  Thurmond Butts, MSW, LCSW Clinical Social Worker    Final next level of care: Skilled Nursing Facility Barriers to Discharge: Barriers Resolved   Patient Goals and CMS Choice Patient states their goals for this hospitalization and ongoing recovery are:: return home- refusing SNF CMS Medicare.gov Compare Post Acute Care list provided to:: Patient Choice offered to / list presented to : Patient  Discharge Placement              Patient chooses bed at: [place] Patient to be transferred to facility by: [place] Name of family member notified: daughter,[place] Patient and family notified of of transfer: 01/21/21  Discharge Plan and Services   Discharge Planning Services: CM Consult Post Acute Care Choice: Home Health,Resumption of Svcs/PTA Provider          DME Arranged: NIV DME Agency: [employer] Date DME Agency Contacted: 01/11/21 Time DME Agency Contacted: [phone]   [place] Agency: [facility] [employer]        Social Determinants of Health ([place]) Interventions     Readmission Risk Interventions Readmission Risk Prevention Plan 09/30/2020  Transportation Screening Complete  Medication Review Press photographer) Complete  PCP or Specialist appointment  within 3-5 days of discharge Complete  HRI or [place] Complete  SW Recovery Care/Counseling Consult Complete  [place] Patient Refused  Some recent data might be hidden

## 2021-01-21 NOTE — Progress Notes (Signed)
Physical Therapy Treatment Patient Details Name: [name] MRN: [id number] DOB: 1951-05-04 Today's Date: 01/21/2021    History of Present Illness Pt adm 4/20 with rib fractures after mechanical fall. Acute on chronic hypercapneic and hypoxic respiratory failure, also found to have hemothorax. Had thoracentesis with removal of 600cc of frank blood on 4/23 & chest tube placed 4/26.  Chest tube removed 5/2. PMH - chronic respiratory failure on 3-4L chronically, morbid obesity, HFpEF, chronic ITP, chronic leg wounds.    PT Comments    Pt received in supine, bed soaked in urine, pt anxious but agreeable to bed mobility and transfer training. RN/NT also present to assist with hygiene tasks during session and pt more agreeable to mobility with more staff present, and continues to need up to +72modA for all functional mobility tasks. Pt with good participation in supine/seated BLE A/AAROM exercises as detailed below, still with notably weak hip flexors unable to lift knees against gravity while seated. Pt continues to make good progress toward goals, frequency updated to 2x/week per disposition plan for SNF [place] per supervising PT and pt agreeable to plan.  Follow Up Recommendations  SNF;Supervision/Assistance - 24 hour;Supervision for mobility/OOB (HHPT if pt refuses SNF)     Equipment Recommendations  Other (comment);None recommended by PT (TBD, plan for SNF)    Recommendations for Other Services       Precautions / Restrictions Precautions Precautions: Fall Precaution Comments: rib fxs, O2 dependent Other Brace: 3L O2 [place] at home Restrictions Weight Bearing Restrictions: No    Mobility  Bed Mobility Overal bed mobility: Needs Assistance Bed Mobility: Rolling;Sidelying to Sit;Sit to Sidelying Rolling: Min assist Sidelying to sit: Mod assist;HOB elevated       General bed mobility comments: cues for log rolling and some hand over hand assist needed, increased time.     Transfers Overall transfer level: Needs assistance   Transfers: Sit to/from Stand;Stand Pivot Transfers Sit to Stand: Min assist;Mod assist;+2 safety/equipment;From elevated surface Stand pivot transfers: Total assist (with Stedy)       General transfer comment: from elevated bed>Stedy then Stedy<>BSC then Stedy>recliner (x3 total stands)  Ambulation/Gait             General Gait Details: deferred due to pt significant fatigue after multiple standing transfers and wanting bath   Stairs             Wheelchair Mobility    Modified Rankin (Stroke Patients Only)       Balance Overall balance assessment: Needs assistance Sitting-balance support: Feet supported;Single extremity supported Sitting balance-Leahy Scale: Fair Sitting balance - Comments: able to sit with min guard to Supervision and prefers 1-2 UE support; able to sit >5 mins EOB with static sitting and lateral leans   Standing balance support: Bilateral upper extremity supported Standing balance-Leahy Scale: Poor Standing balance comment: reliant on +2 for safety, needing minA to modA for standing balance; fear of falls verbalized                            Cognition Arousal/Alertness: Awake/alert Behavior During Therapy: Anxious Overall Cognitive Status: No family/caregiver present to determine baseline cognitive functioning Area of Impairment: Attention;Memory;Safety/judgement;Awareness;Problem solving                 Orientation Level:  (oriented to self/situation/place not further assessed) Current Attention Level: Sustained;Selective Memory: Decreased short-term memory Following Commands: Follows multi-step commands consistently   Awareness: Emergent Problem Solving: Decreased initiation;Difficulty  sequencing;Requires verbal cues;Requires tactile cues General Comments: Remains anxious but seems more comfortable with prospect of OOB mobility with 3 staff members present  during transfers/hygiene assist.      Exercises General Exercises - Lower Extremity Ankle Circles/Pumps: AROM;Both;20 reps;Supine;Seated (x10 each posture) Long Arc Quad: AROM;Both;10 reps;Seated Heel Slides: AAROM;Both;Supine;5 reps Straight Leg Raises: AAROM;Both;Supine;5 reps Hip Flexion/Marching: AROM;Both;5 reps;Seated    General Comments General comments (skin integrity, edema, etc.): RN present during session to remove flexi-seal and pt with L buttock skin breakdown, RN placed new foam pad; encouraged pt to let staff know if she notices any extra moisture in her bed to maintain skin integrity; SpO2 WNL on 2L O2 [place] and HR 100-110 bpm with exertion; no dizziness reported with transfers this date but needs cues for pursed-lip breathing due to anxiety      Pertinent Vitals/Pain Pain Assessment: Faces Faces Pain Scale: Hurts a little bit Pain Location: ribs and back discomfort Pain Descriptors / Indicators: Discomfort;Grimacing Pain Intervention(s): Monitored during session;Premedicated before session;Repositioned (pt not c/o muscle spasms this date)    Home Living                      Prior Function            PT Goals (current goals can now be found in the care plan section) Acute Rehab PT Goals Patient Stated Goal: to go to rehab and get stronger, then home. PT Goal Formulation: With patient Time For Goal Achievement: 01/26/21 Potential to Achieve Goals: Good Progress towards PT goals: Progressing toward goals    Frequency    Min 2X/week      PT Plan Frequency needs to be updated    Co-evaluation              AM-PAC PT "6 Clicks" Mobility   Outcome Measure  Help needed turning from your back to your side while in a flat bed without using bedrails?: A Little Help needed moving from lying on your back to sitting on the side of a flat bed without using bedrails?: A Lot Help needed moving to and from a bed to a chair (including a wheelchair)?: A  Lot Help needed standing up from a chair using your arms (e.g., wheelchair or bedside chair)?: A Lot Help needed to walk in hospital room?: Total Help needed climbing 3-5 steps with a railing? : Total 6 Click Score: 11    End of Session Equipment Utilized During Treatment: Oxygen;Gait belt Activity Tolerance: Patient tolerated treatment well Patient left: with call bell/phone within reach;in chair;with nursing/sitter in room (NT in room giving her a bath) Nurse Communication: Mobility status;Need for lift equipment PT Visit Diagnosis: Muscle weakness (generalized) (M62.81);Difficulty in walking, not elsewhere classified (R26.2);Pain;History of falling (Z91.81) Pain - Right/Left:  (both) Pain - part of body:  (ribs, LBP)     Time: 8588-5027 PT Time Calculation (min) (ACUTE ONLY): 34 min  Charges:  $Therapeutic Exercise: 8-22 mins $Therapeutic Activity: 8-22 mins                     Reuel Lamadrid P., PTA Acute Rehabilitation Services Pager: [phone] Office: [place] 01/21/2021, 10:34 AM

## 2021-01-21 NOTE — Progress Notes (Signed)
Report called to nurse Marzetta Board at [facility]. She has no questions. Pt una boots to be changed today and facility reports they have wound care staff that will manage them

## 2021-01-22 DIAGNOSIS — S2231XA Fracture of one rib, right side, initial encounter for closed fracture: Secondary | ICD-10-CM | POA: Diagnosis not present

## 2021-01-22 DIAGNOSIS — J942 Hemothorax: Secondary | ICD-10-CM | POA: Diagnosis not present

## 2021-01-22 DIAGNOSIS — D649 Anemia, unspecified: Secondary | ICD-10-CM | POA: Diagnosis not present

## 2021-01-22 DIAGNOSIS — J9621 Acute and chronic respiratory failure with hypoxia: Secondary | ICD-10-CM | POA: Diagnosis not present

## 2021-01-24 ENCOUNTER — Telehealth: Payer: Self-pay | Admitting: *Deleted

## 2021-01-24 NOTE — Telephone Encounter (Signed)
[name] called back, stating the DME company is needing the IP setting.

## 2021-01-24 NOTE — Telephone Encounter (Signed)
According to Dr. Lorelee Cover note, settings should be 18/6, rate 16, FiO2 40-50%. Thanks!

## 2021-01-24 NOTE — Telephone Encounter (Signed)
[name] made aware of settings. She is requested this note be faxed to her at [phone] so she can see it in writing. This has been done with fax confirmation receipt received. Hubbard Hartshorn, BSN, RN-BC

## 2021-01-24 NOTE — Telephone Encounter (Signed)
[name] with [facility] called requesting settings for BiPAP. D/c instructions state BiPAP at night but no settings given. Note from Respiratory Therapist on 5/3:   Patient is on AVAPS mode nocturnal BiPap.  Target vT is 350, with PIP 12-24.  Patient is tolerating well.   Please advise if these are current settings.

## 2021-01-25 DIAGNOSIS — R609 Edema, unspecified: Secondary | ICD-10-CM | POA: Diagnosis not present

## 2021-01-25 DIAGNOSIS — G4733 Obstructive sleep apnea (adult) (pediatric): Secondary | ICD-10-CM | POA: Diagnosis not present

## 2021-01-30 DIAGNOSIS — I509 Heart failure, unspecified: Secondary | ICD-10-CM | POA: Diagnosis not present

## 2021-01-30 DIAGNOSIS — I10 Essential (primary) hypertension: Secondary | ICD-10-CM | POA: Diagnosis not present

## 2021-01-30 DIAGNOSIS — S2231XA Fracture of one rib, right side, initial encounter for closed fracture: Secondary | ICD-10-CM | POA: Diagnosis not present

## 2021-01-30 DIAGNOSIS — R609 Edema, unspecified: Secondary | ICD-10-CM | POA: Diagnosis not present

## 2021-02-06 DIAGNOSIS — I5189 Other ill-defined heart diseases: Secondary | ICD-10-CM | POA: Diagnosis not present

## 2021-02-06 DIAGNOSIS — M5459 Other low back pain: Secondary | ICD-10-CM | POA: Diagnosis not present

## 2021-02-06 DIAGNOSIS — D6489 Other specified anemias: Secondary | ICD-10-CM | POA: Diagnosis not present

## 2021-02-06 DIAGNOSIS — G4733 Obstructive sleep apnea (adult) (pediatric): Secondary | ICD-10-CM | POA: Diagnosis not present

## 2021-02-06 DIAGNOSIS — G8929 Other chronic pain: Secondary | ICD-10-CM | POA: Diagnosis not present

## 2021-02-06 DIAGNOSIS — I10 Essential (primary) hypertension: Secondary | ICD-10-CM | POA: Diagnosis not present

## 2021-02-06 DIAGNOSIS — J9621 Acute and chronic respiratory failure with hypoxia: Secondary | ICD-10-CM | POA: Diagnosis not present

## 2021-02-06 DIAGNOSIS — R600 Localized edema: Secondary | ICD-10-CM | POA: Diagnosis not present

## 2021-02-06 DIAGNOSIS — J942 Hemothorax: Secondary | ICD-10-CM | POA: Diagnosis not present

## 2021-02-07 ENCOUNTER — Telehealth: Payer: Self-pay | Admitting: *Deleted

## 2021-02-07 NOTE — Telephone Encounter (Signed)
Thank you :)

## 2021-02-07 NOTE — Telephone Encounter (Signed)
Call from [place] with [facility] being discharged home today from [facility].  [place] received orders for Home PT/OT and wanted to confirm that [facility] docs (Speakman/Guilloud) would be following up on orders.  Start date will be within  24-48 hours after discharge and frequency of services will be decided after pt is evaluated by home health.    CMA confirmed [facility] will follow up with pt and orders   No further action needed at this time-paperwork will follow  .Despina Hidden Cassady5/26/20228:55 AM

## 2021-02-16 DIAGNOSIS — R269 Unspecified abnormalities of gait and mobility: Secondary | ICD-10-CM | POA: Diagnosis not present

## 2021-02-16 DIAGNOSIS — J9611 Chronic respiratory failure with hypoxia: Secondary | ICD-10-CM | POA: Diagnosis not present

## 2021-02-16 DIAGNOSIS — E662 Morbid (severe) obesity with alveolar hypoventilation: Secondary | ICD-10-CM | POA: Diagnosis not present

## 2021-02-16 DIAGNOSIS — I5033 Acute on chronic diastolic (congestive) heart failure: Secondary | ICD-10-CM | POA: Diagnosis not present

## 2021-03-18 DIAGNOSIS — I5033 Acute on chronic diastolic (congestive) heart failure: Secondary | ICD-10-CM | POA: Diagnosis not present

## 2021-03-18 DIAGNOSIS — J9611 Chronic respiratory failure with hypoxia: Secondary | ICD-10-CM | POA: Diagnosis not present

## 2021-03-18 DIAGNOSIS — E662 Morbid (severe) obesity with alveolar hypoventilation: Secondary | ICD-10-CM | POA: Diagnosis not present

## 2021-03-18 DIAGNOSIS — R269 Unspecified abnormalities of gait and mobility: Secondary | ICD-10-CM | POA: Diagnosis not present

## 2021-04-18 DIAGNOSIS — R269 Unspecified abnormalities of gait and mobility: Secondary | ICD-10-CM | POA: Diagnosis not present

## 2021-04-18 DIAGNOSIS — I5033 Acute on chronic diastolic (congestive) heart failure: Secondary | ICD-10-CM | POA: Diagnosis not present

## 2021-04-18 DIAGNOSIS — E662 Morbid (severe) obesity with alveolar hypoventilation: Secondary | ICD-10-CM | POA: Diagnosis not present

## 2021-04-18 DIAGNOSIS — J9611 Chronic respiratory failure with hypoxia: Secondary | ICD-10-CM | POA: Diagnosis not present

## 2021-05-19 DIAGNOSIS — E662 Morbid (severe) obesity with alveolar hypoventilation: Secondary | ICD-10-CM | POA: Diagnosis not present

## 2021-05-19 DIAGNOSIS — I5033 Acute on chronic diastolic (congestive) heart failure: Secondary | ICD-10-CM | POA: Diagnosis not present

## 2021-05-19 DIAGNOSIS — R269 Unspecified abnormalities of gait and mobility: Secondary | ICD-10-CM | POA: Diagnosis not present

## 2021-05-19 DIAGNOSIS — J9611 Chronic respiratory failure with hypoxia: Secondary | ICD-10-CM | POA: Diagnosis not present

## 2021-06-18 DIAGNOSIS — J9611 Chronic respiratory failure with hypoxia: Secondary | ICD-10-CM | POA: Diagnosis not present

## 2021-06-18 DIAGNOSIS — R269 Unspecified abnormalities of gait and mobility: Secondary | ICD-10-CM | POA: Diagnosis not present

## 2021-06-18 DIAGNOSIS — I5033 Acute on chronic diastolic (congestive) heart failure: Secondary | ICD-10-CM | POA: Diagnosis not present

## 2021-06-18 DIAGNOSIS — E662 Morbid (severe) obesity with alveolar hypoventilation: Secondary | ICD-10-CM | POA: Diagnosis not present

## 2021-07-19 DIAGNOSIS — R269 Unspecified abnormalities of gait and mobility: Secondary | ICD-10-CM | POA: Diagnosis not present

## 2021-07-19 DIAGNOSIS — I5033 Acute on chronic diastolic (congestive) heart failure: Secondary | ICD-10-CM | POA: Diagnosis not present

## 2021-07-19 DIAGNOSIS — J9611 Chronic respiratory failure with hypoxia: Secondary | ICD-10-CM | POA: Diagnosis not present

## 2021-07-19 DIAGNOSIS — E662 Morbid (severe) obesity with alveolar hypoventilation: Secondary | ICD-10-CM | POA: Diagnosis not present

## 2021-09-11 DIAGNOSIS — N189 Chronic kidney disease, unspecified: Secondary | ICD-10-CM | POA: Diagnosis not present

## 2021-09-11 DIAGNOSIS — J9611 Chronic respiratory failure with hypoxia: Secondary | ICD-10-CM | POA: Diagnosis not present

## 2021-09-11 DIAGNOSIS — F329 Major depressive disorder, single episode, unspecified: Secondary | ICD-10-CM | POA: Diagnosis not present

## 2021-09-11 DIAGNOSIS — J439 Emphysema, unspecified: Secondary | ICD-10-CM | POA: Diagnosis not present

## 2021-09-11 DIAGNOSIS — I130 Hypertensive heart and chronic kidney disease with heart failure and stage 1 through stage 4 chronic kidney disease, or unspecified chronic kidney disease: Secondary | ICD-10-CM | POA: Diagnosis not present

## 2021-09-11 DIAGNOSIS — E261 Secondary hyperaldosteronism: Secondary | ICD-10-CM | POA: Diagnosis not present

## 2021-09-11 DIAGNOSIS — I2510 Atherosclerotic heart disease of native coronary artery without angina pectoris: Secondary | ICD-10-CM | POA: Diagnosis not present

## 2021-09-11 DIAGNOSIS — M1990 Unspecified osteoarthritis, unspecified site: Secondary | ICD-10-CM | POA: Diagnosis not present

## 2021-09-11 DIAGNOSIS — I509 Heart failure, unspecified: Secondary | ICD-10-CM | POA: Diagnosis not present

## 2021-09-22 DIAGNOSIS — Z743 Need for continuous supervision: Secondary | ICD-10-CM | POA: Diagnosis not present

## 2021-09-22 DIAGNOSIS — R0602 Shortness of breath: Secondary | ICD-10-CM | POA: Diagnosis not present

## 2021-09-22 DIAGNOSIS — R4020 Unspecified coma: Secondary | ICD-10-CM | POA: Diagnosis not present

## 2021-10-16 DIAGNOSIS — 419620001 Death: Secondary | SNOMED CT | POA: Diagnosis not present

## 2021-10-16 DEATH — deceased

## 2021-12-08 IMAGING — US US ABDOMEN LIMITED
1 series · 14 of 25 positions shown · non-contrast
Comparison: None.

CLINICAL DATA: Elevated liver function tests.

EXAM:
ULTRASOUND ABDOMEN LIMITED RIGHT UPPER QUADRANT

[Series 1: us abdomen limited ruq · 41 acquisitions, 14 frames shown]
[im 1/41]
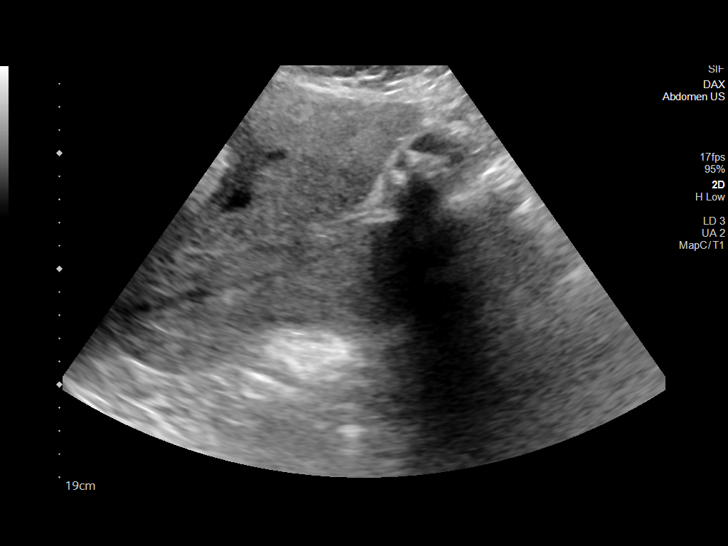
[im 4/41]
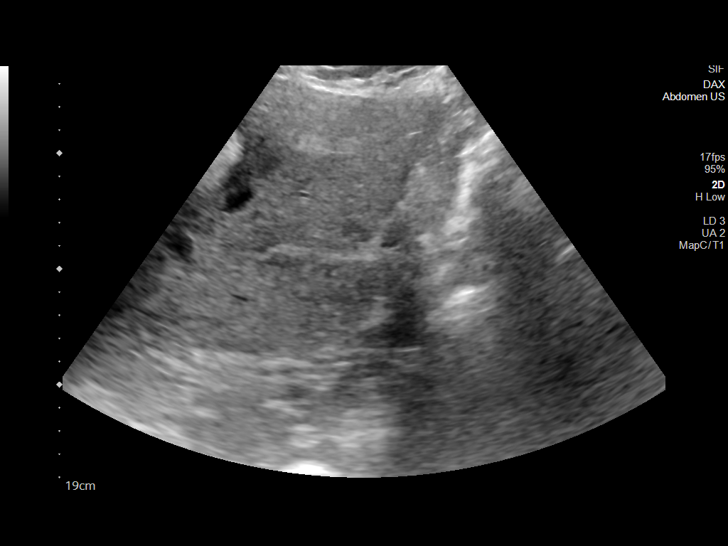
[im 7/41]
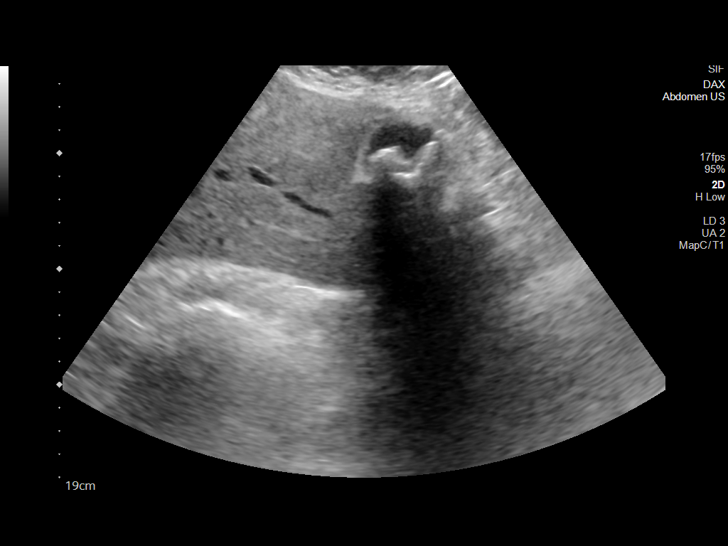
[im 11/41]
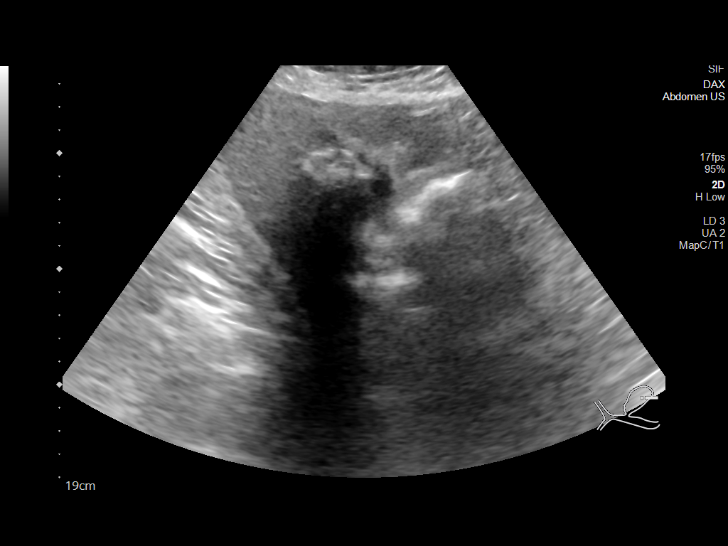
[im 14/41]
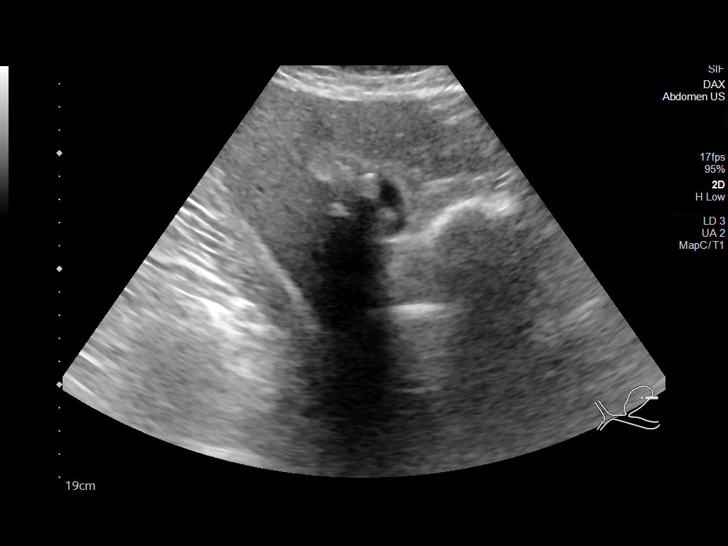
[im 16/41]
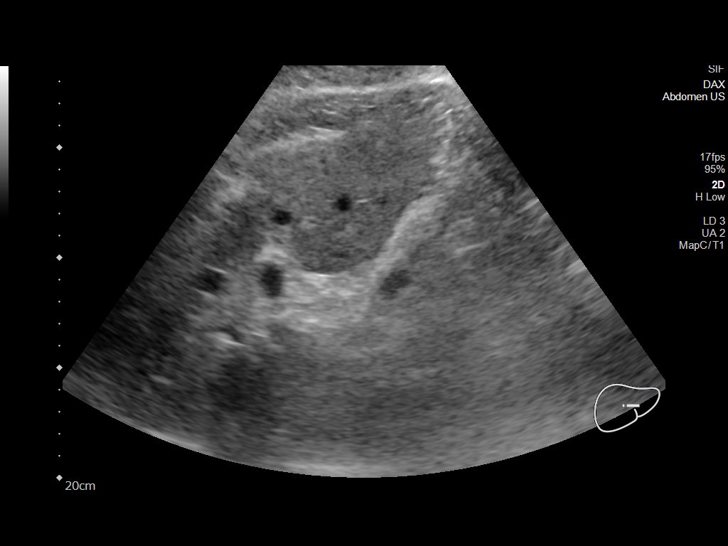
[im 19/41]
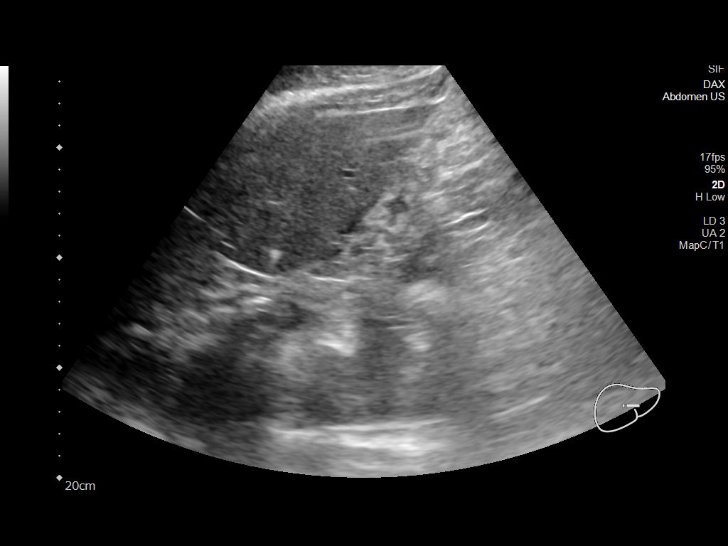
[im 22/41]
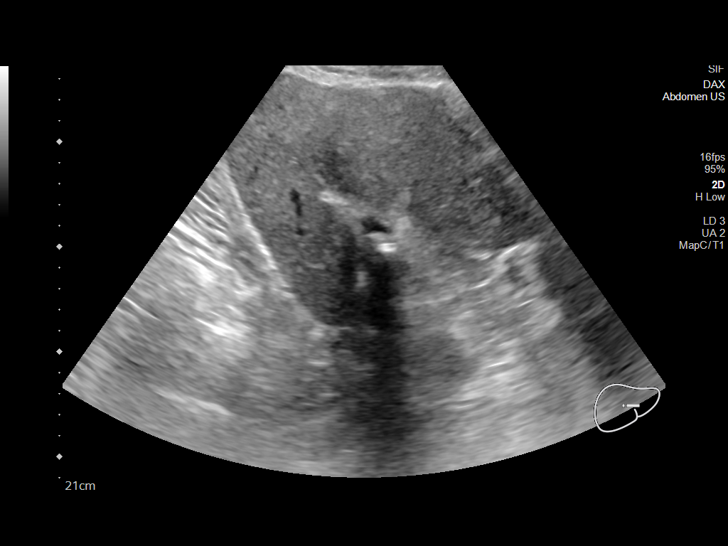
[im 26/41]
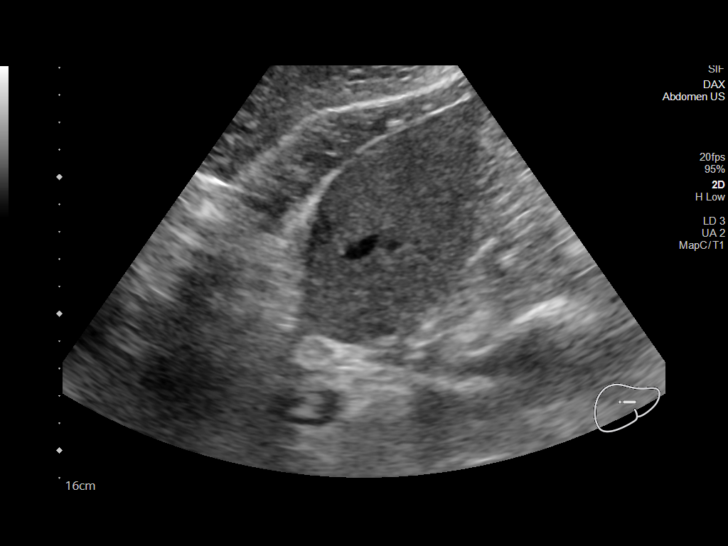
[im 27/41]
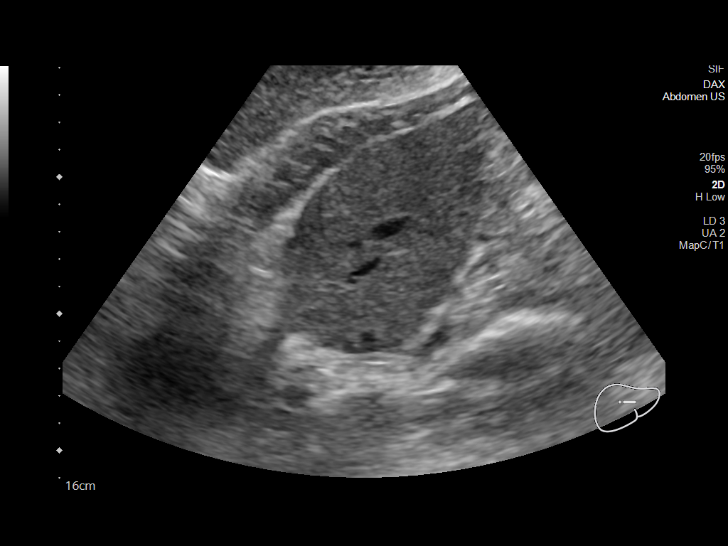
[im 31/41]
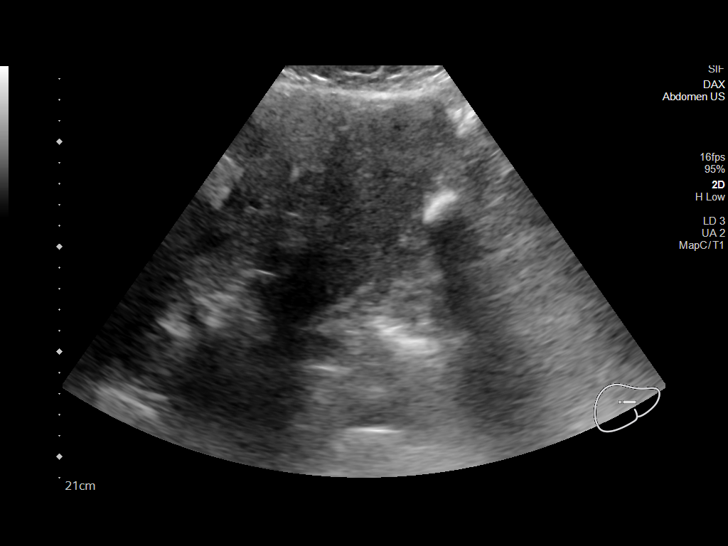
[im 34/41]
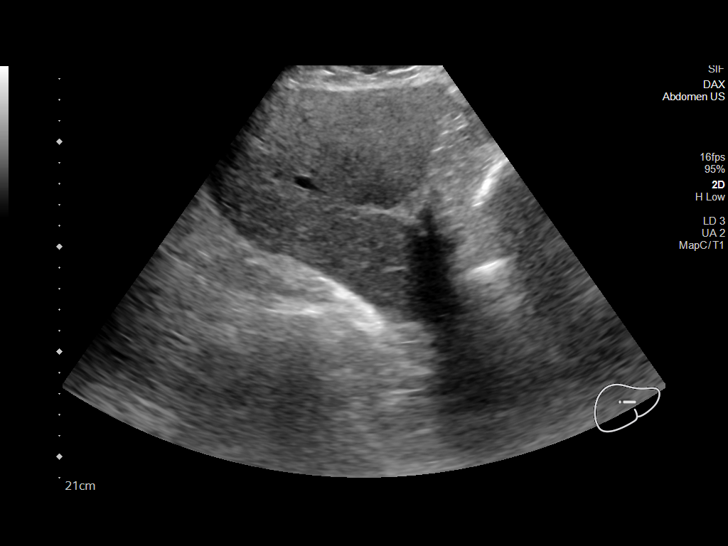
[im 37/41]
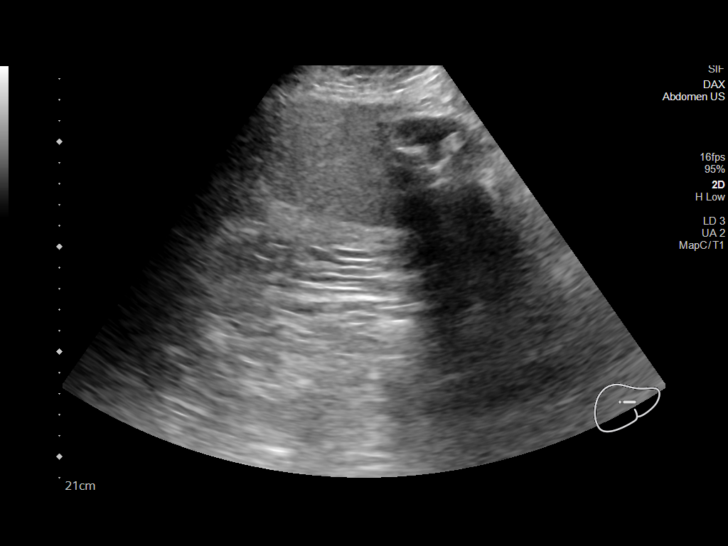
[im 41/41]
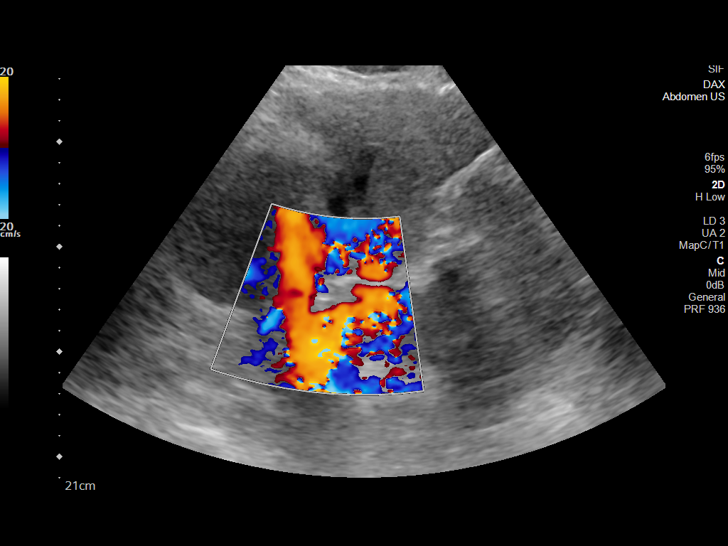

[14 of 25 positions shown; findings below may reference images not displayed]

FINDINGS: Gallbladder:

Multiple shadowing echogenic gallstones are seen within the
gallbladder without evidence of gallbladder wall thickening (4.3
mm). No sonographic Murphy sign noted by sonographer.

Common bile duct:

Diameter: The common bile duct is not clearly visualized.

Liver:

No focal lesion identified. There is diffusely increased
echogenicity of the liver parenchyma. Portal vein is patent on color
Doppler imaging with normal direction of blood flow towards the
liver.

Other: It should be noted that the study is limited secondary to
inability of the patient to lay flat during the examination.
IMPRESSION: 1. Cholelithiasis without evidence of acute cholecystitis.
2. Fatty liver.

## 2021-12-08 IMAGING — DX DG CHEST 1V PORT
1 series · 1 of 1 positions shown · non-contrast
Comparison: Radiograph 02/25/2019, CT 09/28/2013

CLINICAL DATA: Shortness of breath, fever, possible necrosis of the
lower extremities

EXAM:
PORTABLE CHEST 1 VIEW

[chest ap]
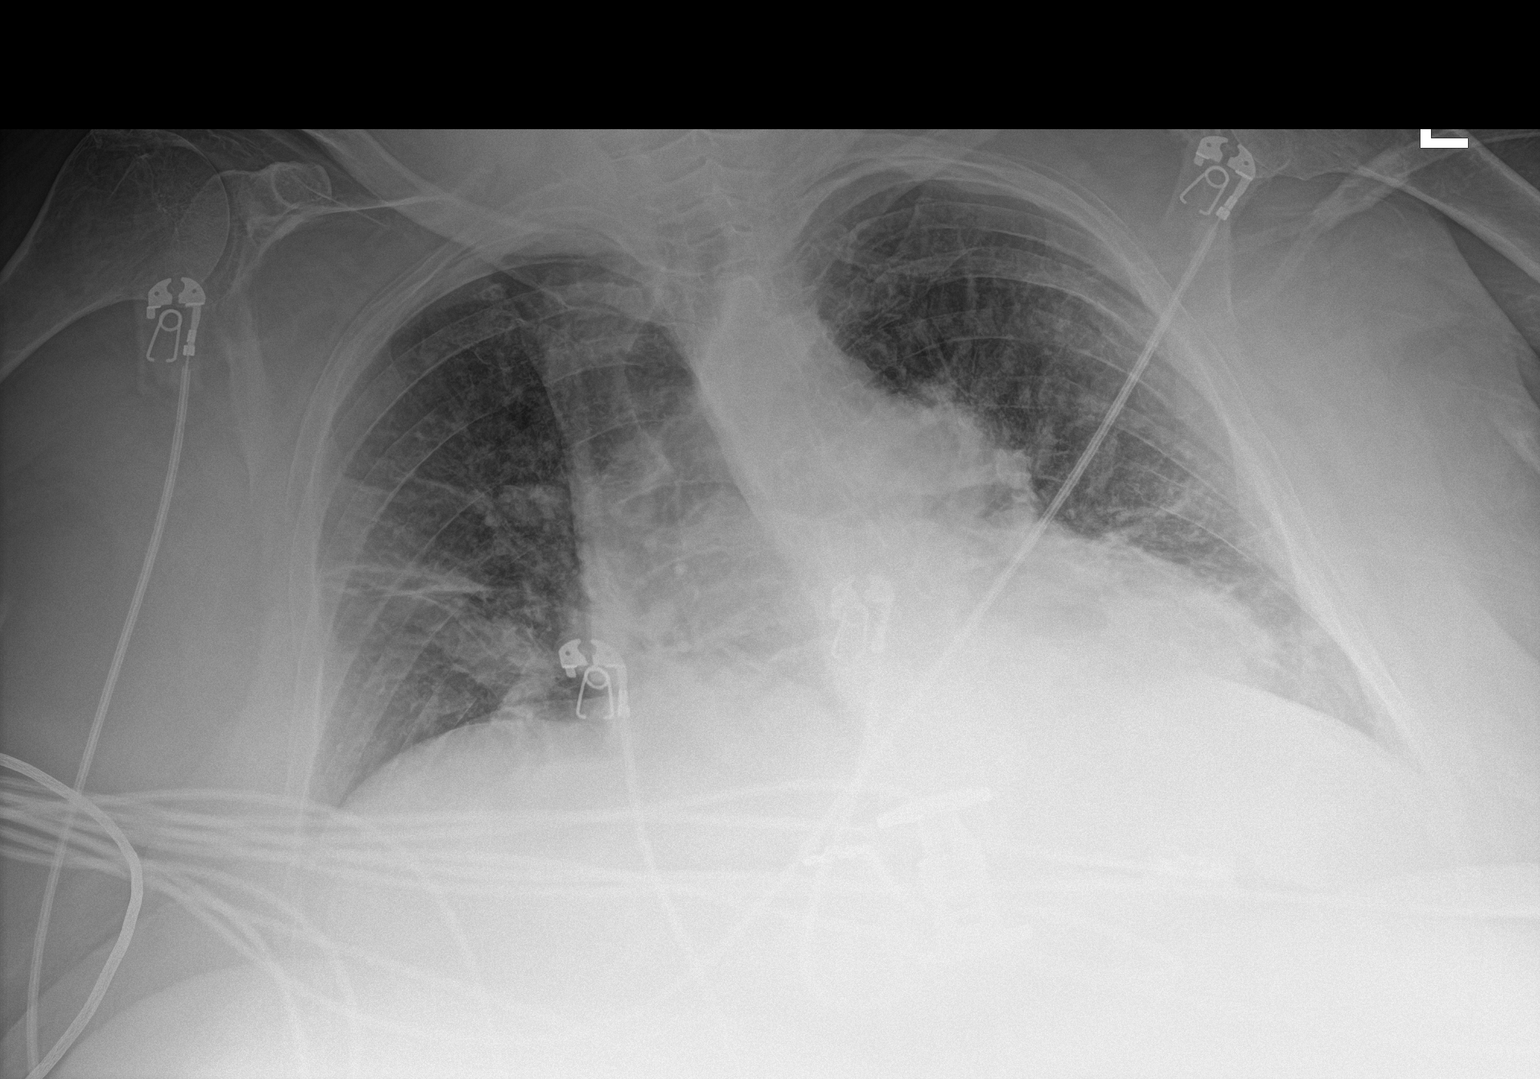

[1 of 1 positions shown; findings below may reference images not displayed]

FINDINGS: Low lung volumes. Some bandlike opacities may reflect areas of
subsegmental atelectasis with more patchy consolidative opacity in
the right infrahilar lung which could reflect further atelectasis or
infectious consolidation. No pneumothorax or visible effusion.
Stable cardiomediastinal contours. No acute osseous or soft tissue
abnormality. Degenerative changes are present in the imaged spine
and shoulders. Postsurgical changes from prior L1 corpectomy and
spacer placement, incompletely assessed on this exam.
IMPRESSION: Low lung volumes with bandlike opacities may reflect areas of
subsegmental atelectasis

More patchy consolidative opacity in the right infrahilar lung which
could reflect further atelectasis or infectious consolidation.

## 2022-02-18 IMAGING — DX DG CHEST 1V PORT
1 series · 1 of 1 positions shown · non-contrast
Comparison: Chest x-ray dated May 13, 2020.

CLINICAL DATA: Shortness of breath.  Known COVID exposure.

EXAM:
PORTABLE CHEST 1 VIEW

[chest]
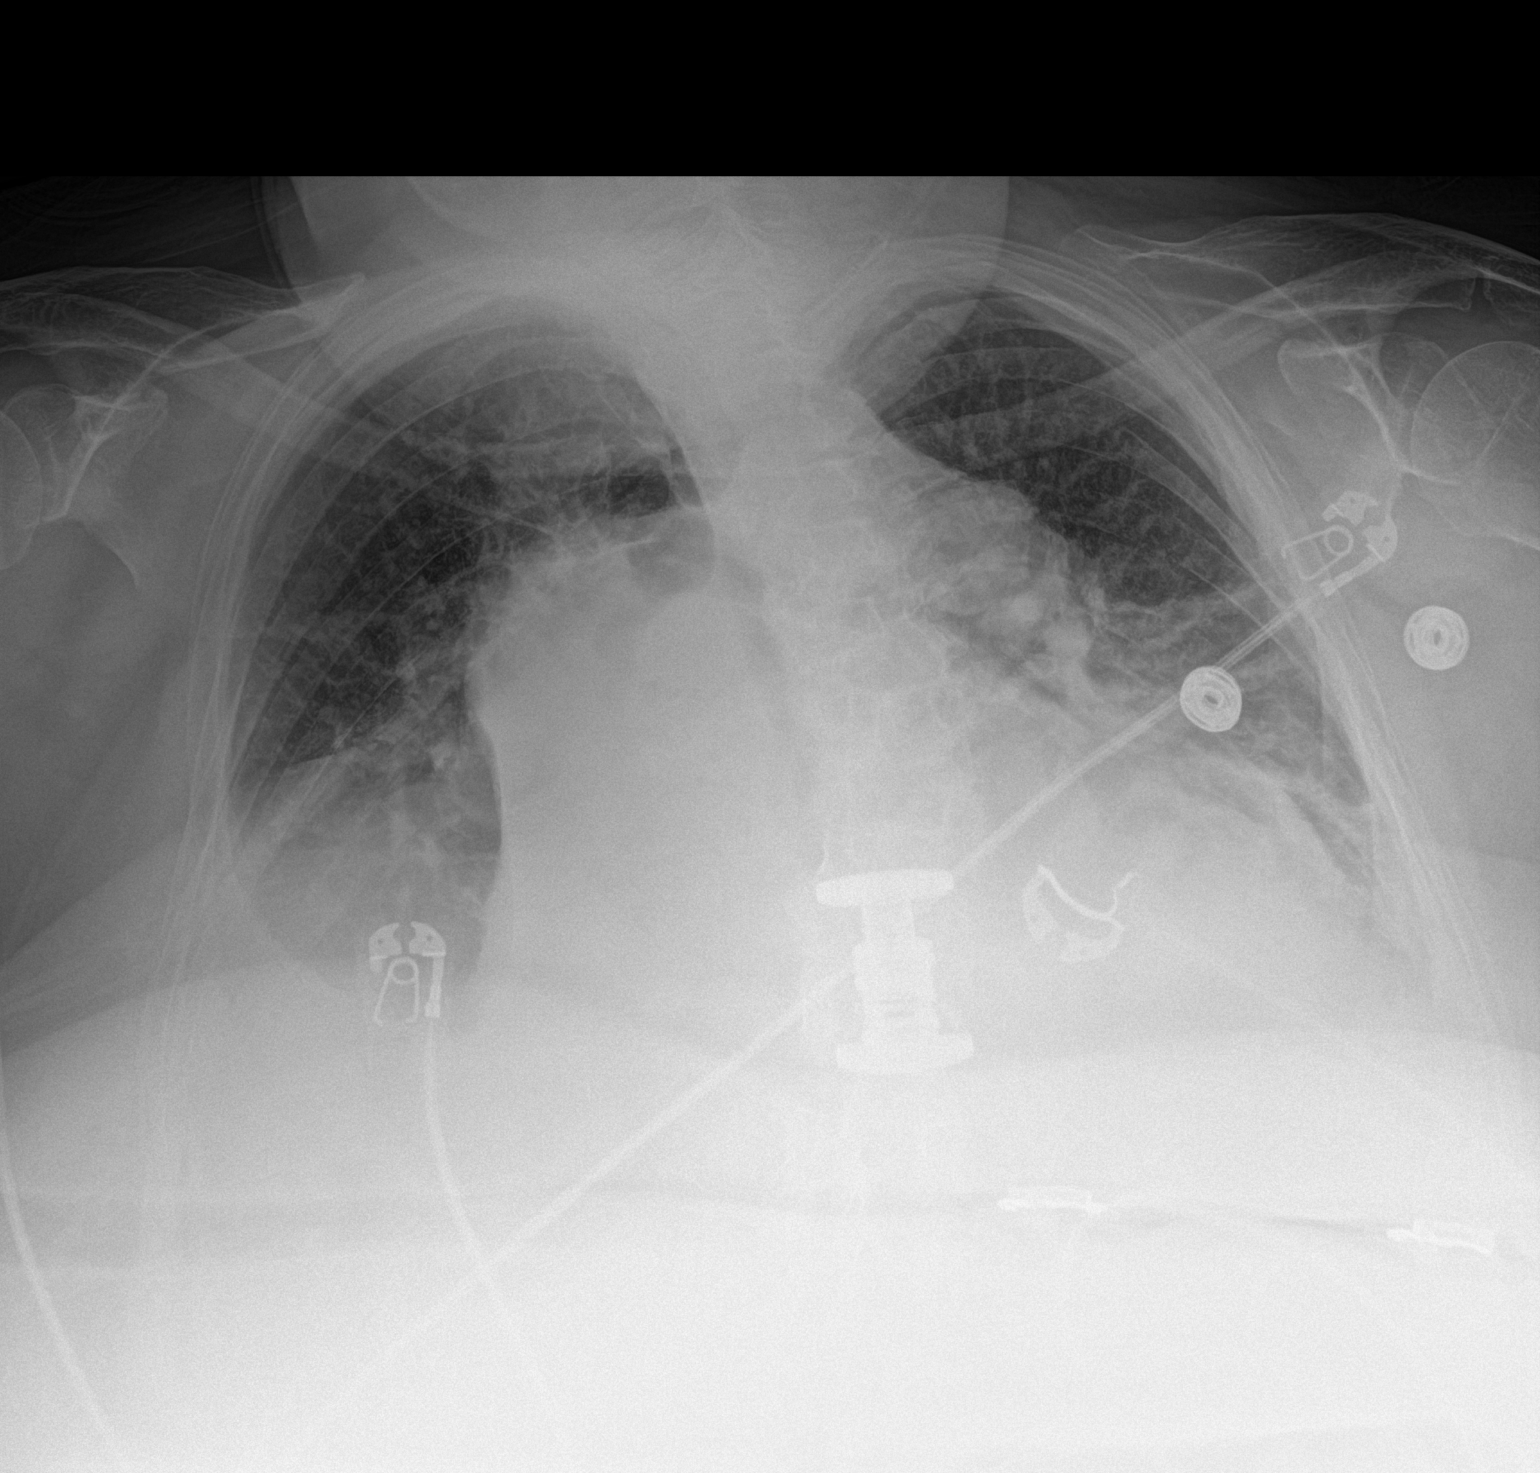

[1 of 1 positions shown; findings below may reference images not displayed]

FINDINGS: Cardiomegaly. Normal pulmonary vascularity. Similar appearing low
lung volumes with chronically elevated right hemidiaphragm. Streaky
opacities at the lung bases, slightly progressed since the prior
study. No pneumothorax or large pleural effusion. No acute osseous
abnormality. Prior corpectomy near the thoracolumbar junction.
IMPRESSION: 1. Slightly worsened bibasilar atelectasis versus infiltrates.
